# Patient Record
Sex: Female | Born: 1935 | Race: White | Hispanic: No | Marital: Married | Smoking: Former smoker
Health system: Southern US, Community
[De-identification: ages and names within clinical notes are randomized; demographics above are authoritative.]

## PROBLEM LIST (undated history)

## (undated) DIAGNOSIS — I739 Peripheral vascular disease, unspecified: Secondary | ICD-10-CM

## (undated) DIAGNOSIS — K219 Gastro-esophageal reflux disease without esophagitis: Secondary | ICD-10-CM

## (undated) DIAGNOSIS — G2581 Restless legs syndrome: Secondary | ICD-10-CM

## (undated) DIAGNOSIS — Z86718 Personal history of other venous thrombosis and embolism: Secondary | ICD-10-CM

## (undated) DIAGNOSIS — M1990 Unspecified osteoarthritis, unspecified site: Secondary | ICD-10-CM

## (undated) DIAGNOSIS — Z9621 Cochlear implant status: Secondary | ICD-10-CM

## (undated) DIAGNOSIS — J9611 Chronic respiratory failure with hypoxia: Secondary | ICD-10-CM

## (undated) DIAGNOSIS — I10 Essential (primary) hypertension: Secondary | ICD-10-CM

## (undated) DIAGNOSIS — G629 Polyneuropathy, unspecified: Secondary | ICD-10-CM

## (undated) DIAGNOSIS — D649 Anemia, unspecified: Secondary | ICD-10-CM

## (undated) DIAGNOSIS — E876 Hypokalemia: Secondary | ICD-10-CM

## (undated) DIAGNOSIS — J189 Pneumonia, unspecified organism: Secondary | ICD-10-CM

## (undated) DIAGNOSIS — N289 Disorder of kidney and ureter, unspecified: Secondary | ICD-10-CM

## (undated) DIAGNOSIS — Z9981 Dependence on supplemental oxygen: Secondary | ICD-10-CM

## (undated) DIAGNOSIS — I517 Cardiomegaly: Secondary | ICD-10-CM

## (undated) DIAGNOSIS — J40 Bronchitis, not specified as acute or chronic: Secondary | ICD-10-CM

## (undated) DIAGNOSIS — J45909 Unspecified asthma, uncomplicated: Secondary | ICD-10-CM

## (undated) DIAGNOSIS — K8020 Calculus of gallbladder without cholecystitis without obstruction: Secondary | ICD-10-CM

## (undated) HISTORY — DX: Unspecified asthma, uncomplicated: J45.909

## (undated) HISTORY — PX: BREAST BIOPSY: SHX20

## (undated) HISTORY — DX: Personal history of other venous thrombosis and embolism: Z86.718

## (undated) HISTORY — PX: EYE SURGERY: SHX253

## (undated) HISTORY — DX: Calculus of gallbladder without cholecystitis without obstruction: K80.20

## (undated) HISTORY — PX: OTHER SURGICAL HISTORY: SHX169

## (undated) HISTORY — DX: Gastro-esophageal reflux disease without esophagitis: K21.9

## (undated) HISTORY — DX: Anemia, unspecified: D64.9

## (undated) HISTORY — DX: Unspecified osteoarthritis, unspecified site: M19.90

---
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
## 1958-01-11 HISTORY — PX: ABDOMINAL HYSTERECTOMY: SHX81

## 2003-03-04 ENCOUNTER — Other Ambulatory Visit: Payer: Self-pay

## 2004-03-03 ENCOUNTER — Ambulatory Visit: Payer: Self-pay | Admitting: *Deleted

## 2004-08-05 ENCOUNTER — Ambulatory Visit: Payer: Self-pay | Admitting: Internal Medicine

## 2004-10-28 ENCOUNTER — Ambulatory Visit: Payer: Self-pay | Admitting: Internal Medicine

## 2004-11-10 ENCOUNTER — Ambulatory Visit: Payer: Self-pay | Admitting: Gastroenterology

## 2005-04-26 ENCOUNTER — Ambulatory Visit: Payer: Self-pay | Admitting: *Deleted

## 2005-08-24 ENCOUNTER — Ambulatory Visit: Payer: Self-pay | Admitting: Internal Medicine

## 2005-09-16 ENCOUNTER — Ambulatory Visit: Payer: Self-pay | Admitting: Internal Medicine

## 2006-01-03 ENCOUNTER — Emergency Department: Payer: Self-pay | Admitting: Emergency Medicine

## 2006-01-06 ENCOUNTER — Ambulatory Visit: Payer: Self-pay | Admitting: Internal Medicine

## 2006-01-31 ENCOUNTER — Ambulatory Visit: Payer: Self-pay | Admitting: Internal Medicine

## 2006-04-18 ENCOUNTER — Ambulatory Visit (HOSPITAL_COMMUNITY): Admission: RE | Admit: 2006-04-18 | Discharge: 2006-04-19 | Payer: Self-pay | Admitting: *Deleted

## 2006-09-01 ENCOUNTER — Ambulatory Visit: Payer: Self-pay | Admitting: Internal Medicine

## 2006-12-06 ENCOUNTER — Ambulatory Visit: Payer: Self-pay | Admitting: Ophthalmology

## 2006-12-06 ENCOUNTER — Other Ambulatory Visit: Payer: Self-pay

## 2006-12-19 ENCOUNTER — Ambulatory Visit: Payer: Self-pay | Admitting: Ophthalmology

## 2007-01-11 ENCOUNTER — Ambulatory Visit: Payer: Self-pay | Admitting: Ophthalmology

## 2007-01-12 HISTORY — PX: COCHLEAR IMPLANT: SUR684

## 2007-01-24 ENCOUNTER — Ambulatory Visit: Payer: Self-pay | Admitting: Ophthalmology

## 2007-08-02 ENCOUNTER — Ambulatory Visit: Payer: Self-pay | Admitting: Unknown Physician Specialty

## 2007-09-05 ENCOUNTER — Ambulatory Visit: Payer: Self-pay | Admitting: Internal Medicine

## 2007-11-21 ENCOUNTER — Emergency Department: Payer: Self-pay | Admitting: Emergency Medicine

## 2007-11-21 ENCOUNTER — Ambulatory Visit: Payer: Self-pay | Admitting: Unknown Physician Specialty

## 2008-03-05 ENCOUNTER — Ambulatory Visit: Payer: Self-pay | Admitting: Vascular Surgery

## 2008-09-09 ENCOUNTER — Ambulatory Visit: Payer: Self-pay | Admitting: Internal Medicine

## 2008-10-22 ENCOUNTER — Ambulatory Visit: Payer: Self-pay | Admitting: Unknown Physician Specialty

## 2009-04-21 ENCOUNTER — Ambulatory Visit: Payer: Self-pay | Admitting: Internal Medicine

## 2009-05-21 ENCOUNTER — Ambulatory Visit: Payer: Self-pay | Admitting: Pain Medicine

## 2009-05-28 ENCOUNTER — Ambulatory Visit: Payer: Self-pay | Admitting: Pain Medicine

## 2009-06-11 ENCOUNTER — Ambulatory Visit: Payer: Self-pay | Admitting: Pain Medicine

## 2009-06-24 ENCOUNTER — Ambulatory Visit: Payer: Self-pay | Admitting: Pain Medicine

## 2009-07-09 ENCOUNTER — Ambulatory Visit: Payer: Self-pay | Admitting: Pain Medicine

## 2009-07-24 ENCOUNTER — Ambulatory Visit: Payer: Self-pay | Admitting: Pain Medicine

## 2009-08-06 ENCOUNTER — Ambulatory Visit: Payer: Self-pay | Admitting: Pain Medicine

## 2009-08-19 ENCOUNTER — Ambulatory Visit: Payer: Self-pay | Admitting: Pain Medicine

## 2009-09-01 ENCOUNTER — Ambulatory Visit: Payer: Self-pay | Admitting: Pain Medicine

## 2009-09-10 ENCOUNTER — Ambulatory Visit: Payer: Self-pay | Admitting: Internal Medicine

## 2009-12-09 ENCOUNTER — Ambulatory Visit: Payer: Self-pay | Admitting: Internal Medicine

## 2010-07-12 ENCOUNTER — Emergency Department: Payer: Self-pay | Admitting: Emergency Medicine

## 2010-08-13 ENCOUNTER — Ambulatory Visit: Payer: Self-pay | Admitting: Vascular Surgery

## 2010-10-15 ENCOUNTER — Ambulatory Visit: Payer: Self-pay | Admitting: Internal Medicine

## 2011-02-03 ENCOUNTER — Emergency Department: Payer: Self-pay | Admitting: *Deleted

## 2011-02-03 LAB — PROTIME-INR
INR: 1
Prothrombin Time: 13.1 secs (ref 11.5–14.7)

## 2011-02-03 LAB — CBC WITH DIFFERENTIAL/PLATELET
Basophil #: 0.1 10*3/uL (ref 0.0–0.1)
Eosinophil #: 0 10*3/uL (ref 0.0–0.7)
HCT: 39.3 % (ref 35.0–47.0)
Lymphocyte #: 1.2 10*3/uL (ref 1.0–3.6)
Lymphocyte %: 8.5 %
MCHC: 33.8 g/dL (ref 32.0–36.0)
Monocyte %: 3 %
Neutrophil #: 12.2 10*3/uL — ABNORMAL HIGH (ref 1.4–6.5)
Platelet: 262 10*3/uL (ref 150–440)
RBC: 4.14 10*6/uL (ref 3.80–5.20)
RDW: 15.8 % — ABNORMAL HIGH (ref 11.5–14.5)
WBC: 13.8 10*3/uL — ABNORMAL HIGH (ref 3.6–11.0)

## 2011-02-03 LAB — COMPREHENSIVE METABOLIC PANEL
Albumin: 3.4 g/dL (ref 3.4–5.0)
Alkaline Phosphatase: 50 U/L (ref 50–136)
Anion Gap: 12 (ref 7–16)
Bilirubin,Total: 0.2 mg/dL (ref 0.2–1.0)
Chloride: 97 mmol/L — ABNORMAL LOW (ref 98–107)
Co2: 29 mmol/L (ref 21–32)
Creatinine: 0.89 mg/dL (ref 0.60–1.30)
EGFR (African American): >60
EGFR (Non-African Amer.): >60
Osmolality: 282 (ref 275–301)
Potassium: 3.7 mmol/L (ref 3.5–5.1)
SGOT(AST): 25 U/L (ref 15–37)
Sodium: 138 mmol/L (ref 136–145)

## 2011-02-03 LAB — APTT: Activated PTT: <23 secs — ABNORMAL LOW (ref 23.6–35.9)

## 2011-02-06 ENCOUNTER — Emergency Department: Payer: Self-pay | Admitting: Internal Medicine

## 2011-07-26 ENCOUNTER — Ambulatory Visit: Payer: Self-pay | Admitting: Vascular Surgery

## 2011-07-26 LAB — CREATININE, SERUM
Creatinine: 0.92 mg/dL (ref 0.60–1.30)
EGFR (African American): >60
EGFR (Non-African Amer.): 59 — ABNORMAL LOW

## 2011-07-26 LAB — BUN: BUN: 17 mg/dL (ref 7–18)

## 2011-10-18 ENCOUNTER — Ambulatory Visit: Payer: Self-pay | Admitting: Internal Medicine

## 2011-11-03 ENCOUNTER — Ambulatory Visit: Payer: Self-pay | Admitting: Cardiology

## 2012-01-12 DIAGNOSIS — Z86718 Personal history of other venous thrombosis and embolism: Secondary | ICD-10-CM

## 2012-01-12 HISTORY — DX: Personal history of other venous thrombosis and embolism: Z86.718

## 2012-02-14 ENCOUNTER — Ambulatory Visit: Payer: Self-pay | Admitting: Vascular Surgery

## 2012-02-14 LAB — CREATININE, SERUM
EGFR (African American): >60
EGFR (Non-African Amer.): 55 — ABNORMAL LOW

## 2012-02-14 LAB — BUN: BUN: 26 mg/dL — ABNORMAL HIGH (ref 7–18)

## 2012-08-21 ENCOUNTER — Ambulatory Visit: Payer: Self-pay | Admitting: Vascular Surgery

## 2012-08-21 LAB — CREATININE, SERUM
Creatinine: 0.93 mg/dL (ref 0.60–1.30)
EGFR (African American): >60
EGFR (Non-African Amer.): 58 — ABNORMAL LOW

## 2012-08-21 LAB — BUN: BUN: 24 mg/dL — ABNORMAL HIGH (ref 7–18)

## 2012-08-22 ENCOUNTER — Inpatient Hospital Stay: Payer: Self-pay | Admitting: Vascular Surgery

## 2012-08-22 LAB — CBC WITH DIFFERENTIAL/PLATELET
Basophil #: 0 10*3/uL (ref 0.0–0.1)
Eosinophil #: 0.1 10*3/uL (ref 0.0–0.7)
Eosinophil %: 0.7 %
HCT: 32.9 % — ABNORMAL LOW (ref 35.0–47.0)
HGB: 11 g/dL — ABNORMAL LOW (ref 12.0–16.0)
Lymphocyte #: 1.9 10*3/uL (ref 1.0–3.6)
Lymphocyte %: 19.5 %
MCV: 86 fL (ref 80–100)
Monocyte %: 11.8 %
Neutrophil %: 67.6 %
Platelet: 241 10*3/uL (ref 150–440)
RDW: 18.3 % — ABNORMAL HIGH (ref 11.5–14.5)
WBC: 10 10*3/uL (ref 3.6–11.0)

## 2012-08-22 LAB — BASIC METABOLIC PANEL
Chloride: 104 mmol/L (ref 98–107)
Co2: 31 mmol/L (ref 21–32)
Creatinine: 0.77 mg/dL (ref 0.60–1.30)
EGFR (African American): >60
EGFR (Non-African Amer.): >60
Glucose: 115 mg/dL — ABNORMAL HIGH (ref 65–99)
Sodium: 138 mmol/L (ref 136–145)

## 2012-08-22 LAB — URINALYSIS, COMPLETE
Bilirubin,UR: NEGATIVE
Glucose,UR: 50 mg/dL (ref 0–75)
Ketone: NEGATIVE
Leukocyte Esterase: NEGATIVE
Nitrite: NEGATIVE
Protein: NEGATIVE
Specific Gravity: 1.004 (ref 1.003–1.030)
WBC UR: NONE SEEN /HPF (ref 0–5)

## 2012-08-23 LAB — BASIC METABOLIC PANEL
Anion Gap: 8 (ref 7–16)
Calcium, Total: 8.8 mg/dL (ref 8.5–10.1)
Chloride: 108 mmol/L — ABNORMAL HIGH (ref 98–107)
Co2: 27 mmol/L (ref 21–32)
EGFR (African American): >60
Glucose: 132 mg/dL — ABNORMAL HIGH (ref 65–99)
Osmolality: 287 (ref 275–301)
Sodium: 143 mmol/L (ref 136–145)

## 2012-08-23 LAB — CBC WITH DIFFERENTIAL/PLATELET
Basophil %: 0.1 %
Eosinophil #: 0 10*3/uL (ref 0.0–0.7)
Eosinophil %: 0 %
HCT: 29.7 % — ABNORMAL LOW (ref 35.0–47.0)
HGB: 9.9 g/dL — ABNORMAL LOW (ref 12.0–16.0)
MCH: 28.5 pg (ref 26.0–34.0)
MCHC: 33.4 g/dL (ref 32.0–36.0)
MCV: 85 fL (ref 80–100)
Monocyte %: 8.3 %
Neutrophil #: 7.6 10*3/uL — ABNORMAL HIGH (ref 1.4–6.5)
Neutrophil %: 79.7 %
Platelet: 210 10*3/uL (ref 150–440)
RBC: 3.47 10*6/uL — ABNORMAL LOW (ref 3.80–5.20)
RDW: 18.2 % — ABNORMAL HIGH (ref 11.5–14.5)

## 2012-08-23 LAB — HEPATIC FUNCTION PANEL A (ARMC)
Albumin: 2.7 g/dL — ABNORMAL LOW (ref 3.4–5.0)
Alkaline Phosphatase: 91 U/L (ref 50–136)
Bilirubin, Direct: <0.1 mg/dL (ref 0.00–0.20)
Bilirubin,Total: 0.2 mg/dL (ref 0.2–1.0)
SGPT (ALT): 23 U/L (ref 12–78)
Total Protein: 6.5 g/dL (ref 6.4–8.2)

## 2012-08-23 LAB — APTT
Activated PTT: 59.8 secs — ABNORMAL HIGH (ref 23.6–35.9)
Activated PTT: 58 secs — ABNORMAL HIGH (ref 23.6–35.9)

## 2012-08-23 LAB — PROTIME-INR: Prothrombin Time: 13.5 secs (ref 11.5–14.7)

## 2012-08-24 LAB — CBC WITH DIFFERENTIAL/PLATELET
Basophil #: 0 10*3/uL (ref 0.0–0.1)
Basophil %: 0.3 %
Eosinophil %: 0 %
HGB: 9.5 g/dL — ABNORMAL LOW (ref 12.0–16.0)
Lymphocyte #: 1.1 10*3/uL (ref 1.0–3.6)
Lymphocyte %: 9.8 %
Monocyte #: 0.7 x10 3/mm (ref 0.2–0.9)
Neutrophil %: 84 %
RBC: 3.39 10*6/uL — ABNORMAL LOW (ref 3.80–5.20)
RDW: 18 % — ABNORMAL HIGH (ref 11.5–14.5)
WBC: 11.1 10*3/uL — ABNORMAL HIGH (ref 3.6–11.0)

## 2012-08-25 LAB — APTT: Activated PTT: 44.9 secs — ABNORMAL HIGH (ref 23.6–35.9)

## 2012-09-02 ENCOUNTER — Emergency Department: Payer: Self-pay | Admitting: Emergency Medicine

## 2012-09-02 LAB — BASIC METABOLIC PANEL
Anion Gap: 5 — ABNORMAL LOW (ref 7–16)
BUN: 14 mg/dL (ref 7–18)
Chloride: 101 mmol/L (ref 98–107)
Co2: 31 mmol/L (ref 21–32)
Creatinine: 0.99 mg/dL (ref 0.60–1.30)
EGFR (Non-African Amer.): 53 — ABNORMAL LOW
Potassium: 2.6 mmol/L — ABNORMAL LOW (ref 3.5–5.1)
Sodium: 137 mmol/L (ref 136–145)

## 2012-09-02 LAB — CBC
HCT: 28.8 % — ABNORMAL LOW (ref 35.0–47.0)
MCHC: 33.6 g/dL (ref 32.0–36.0)
MCV: 86 fL (ref 80–100)
Platelet: 370 10*3/uL (ref 150–440)
WBC: 9.2 10*3/uL (ref 3.6–11.0)

## 2012-09-02 LAB — PROTIME-INR: Prothrombin Time: 12.3 secs (ref 11.5–14.7)

## 2012-09-07 ENCOUNTER — Ambulatory Visit: Payer: Self-pay | Admitting: Vascular Surgery

## 2012-09-07 LAB — BASIC METABOLIC PANEL
Anion Gap: 8 (ref 7–16)
Chloride: 98 mmol/L (ref 98–107)
Co2: 29 mmol/L (ref 21–32)
EGFR (Non-African Amer.): 50 — ABNORMAL LOW
Glucose: 65 mg/dL (ref 65–99)

## 2012-09-08 LAB — CBC
HCT: 26.1 % — ABNORMAL LOW (ref 35.0–47.0)
MCH: 28.5 pg (ref 26.0–34.0)
MCV: 85 fL (ref 80–100)
Platelet: 196 10*3/uL (ref 150–440)
RBC: 3.08 10*6/uL — ABNORMAL LOW (ref 3.80–5.20)
RDW: 18.8 % — ABNORMAL HIGH (ref 11.5–14.5)

## 2012-09-08 LAB — BASIC METABOLIC PANEL
Anion Gap: 6 — ABNORMAL LOW (ref 7–16)
BUN: 12 mg/dL (ref 7–18)
Calcium, Total: 8.3 mg/dL — ABNORMAL LOW (ref 8.5–10.1)
EGFR (African American): >60
EGFR (Non-African Amer.): >60
Glucose: 105 mg/dL — ABNORMAL HIGH (ref 65–99)
Osmolality: 276 (ref 275–301)
Potassium: 2.6 mmol/L — ABNORMAL LOW (ref 3.5–5.1)
Sodium: 138 mmol/L (ref 136–145)

## 2012-09-08 LAB — PROTIME-INR: Prothrombin Time: 14.8 secs — ABNORMAL HIGH (ref 11.5–14.7)

## 2012-10-19 ENCOUNTER — Ambulatory Visit: Payer: Self-pay | Admitting: Internal Medicine

## 2012-11-01 ENCOUNTER — Inpatient Hospital Stay: Payer: Self-pay | Admitting: Vascular Surgery

## 2012-11-01 LAB — BASIC METABOLIC PANEL
Anion Gap: 6 — ABNORMAL LOW (ref 7–16)
BUN: 19 mg/dL — ABNORMAL HIGH (ref 7–18)
Co2: 27 mmol/L (ref 21–32)
Creatinine: 0.85 mg/dL (ref 0.60–1.30)
EGFR (African American): >60
Sodium: 141 mmol/L (ref 136–145)

## 2012-11-01 LAB — CREATININE, SERUM
Creatinine: 0.98 mg/dL (ref 0.60–1.30)
EGFR (African American): >60

## 2012-11-01 LAB — APTT: Activated PTT: 114.8 secs — ABNORMAL HIGH (ref 23.6–35.9)

## 2012-11-01 LAB — BUN: BUN: 23 mg/dL — ABNORMAL HIGH (ref 7–18)

## 2012-11-02 LAB — BASIC METABOLIC PANEL
Calcium, Total: 8.6 mg/dL (ref 8.5–10.1)
Co2: 28 mmol/L (ref 21–32)
EGFR (African American): >60
Glucose: 120 mg/dL — ABNORMAL HIGH (ref 65–99)
Potassium: 3.4 mmol/L — ABNORMAL LOW (ref 3.5–5.1)

## 2012-11-02 LAB — FIBRINOGEN
Fibrinogen: 262 mg/dL (ref 210–470)
Fibrinogen: 320 mg/dL (ref 210–470)

## 2012-11-02 LAB — APTT
Activated PTT: 40.5 secs — ABNORMAL HIGH (ref 23.6–35.9)
Activated PTT: 41.4 secs — ABNORMAL HIGH (ref 23.6–35.9)

## 2012-11-02 LAB — HEMOGLOBIN
HGB: 8.9 g/dL — ABNORMAL LOW (ref 12.0–16.0)
HGB: 8.7 g/dL — ABNORMAL LOW (ref 12.0–16.0)

## 2012-11-03 LAB — CBC WITH DIFFERENTIAL/PLATELET
Basophil #: 0.1 10*3/uL (ref 0.0–0.1)
Basophil %: 0.9 %
Eosinophil #: 0.1 10*3/uL (ref 0.0–0.7)
Eosinophil %: 1 %
HCT: 24 % — ABNORMAL LOW (ref 35.0–47.0)
Lymphocyte %: 12.5 %
MCH: 27.4 pg (ref 26.0–34.0)
MCHC: 33 g/dL (ref 32.0–36.0)
Monocyte #: 1.2 x10 3/mm — ABNORMAL HIGH (ref 0.2–0.9)
Neutrophil #: 8.7 10*3/uL — ABNORMAL HIGH (ref 1.4–6.5)
RBC: 2.88 10*6/uL — ABNORMAL LOW (ref 3.80–5.20)
WBC: 11.5 10*3/uL — ABNORMAL HIGH (ref 3.6–11.0)

## 2012-11-03 LAB — BASIC METABOLIC PANEL
Anion Gap: 7 (ref 7–16)
Calcium, Total: 8.1 mg/dL — ABNORMAL LOW (ref 8.5–10.1)
EGFR (Non-African Amer.): >60
Osmolality: 273 (ref 275–301)
Potassium: 3.3 mmol/L — ABNORMAL LOW (ref 3.5–5.1)
Sodium: 137 mmol/L (ref 136–145)

## 2012-11-04 LAB — BASIC METABOLIC PANEL
BUN: 14 mg/dL (ref 7–18)
Calcium, Total: 8.9 mg/dL (ref 8.5–10.1)
Chloride: 106 mmol/L (ref 98–107)
Creatinine: 0.99 mg/dL (ref 0.60–1.30)
EGFR (African American): >60
EGFR (Non-African Amer.): 53 — ABNORMAL LOW
Osmolality: 277 (ref 275–301)
Potassium: 2.9 mmol/L — ABNORMAL LOW (ref 3.5–5.1)

## 2012-11-04 LAB — CBC WITH DIFFERENTIAL/PLATELET
Basophil #: 0 10*3/uL (ref 0.0–0.1)
Eosinophil %: 0 %
HCT: 22.1 % — ABNORMAL LOW (ref 35.0–47.0)
HGB: 7.3 g/dL — ABNORMAL LOW (ref 12.0–16.0)
Lymphocyte #: 0.9 10*3/uL — ABNORMAL LOW (ref 1.0–3.6)
Lymphocyte %: 6.2 %
MCHC: 32.8 g/dL (ref 32.0–36.0)
MCV: 84 fL (ref 80–100)
Neutrophil #: 12.4 10*3/uL — ABNORMAL HIGH (ref 1.4–6.5)
RDW: 17.3 % — ABNORMAL HIGH (ref 11.5–14.5)

## 2012-11-06 DIAGNOSIS — M109 Gout, unspecified: Secondary | ICD-10-CM

## 2012-11-06 DIAGNOSIS — I998 Other disorder of circulatory system: Secondary | ICD-10-CM

## 2012-11-06 DIAGNOSIS — K219 Gastro-esophageal reflux disease without esophagitis: Secondary | ICD-10-CM

## 2012-11-06 DIAGNOSIS — I10 Essential (primary) hypertension: Secondary | ICD-10-CM

## 2013-01-03 ENCOUNTER — Emergency Department: Payer: Self-pay | Admitting: Emergency Medicine

## 2013-01-03 LAB — TROPONIN I: Troponin-I: <0.02 ng/mL

## 2013-01-03 LAB — CBC WITH DIFFERENTIAL/PLATELET
Basophil #: 0.1 10*3/uL (ref 0.0–0.1)
HCT: 38.8 % (ref 35.0–47.0)
MCH: 27 pg (ref 26.0–34.0)
MCHC: 31.8 g/dL — ABNORMAL LOW (ref 32.0–36.0)
MCV: 85 fL (ref 80–100)
Monocyte #: 0.7 x10 3/mm (ref 0.2–0.9)
Monocyte %: 8.7 %
Neutrophil #: 5.4 10*3/uL (ref 1.4–6.5)
Neutrophil %: 72.7 %
RBC: 4.56 10*6/uL (ref 3.80–5.20)
RDW: 20.3 % — ABNORMAL HIGH (ref 11.5–14.5)

## 2013-01-03 LAB — COMPREHENSIVE METABOLIC PANEL
Albumin: 3.5 g/dL (ref 3.4–5.0)
Alkaline Phosphatase: 94 U/L
Anion Gap: 6 — ABNORMAL LOW (ref 7–16)
Bilirubin,Total: 0.2 mg/dL (ref 0.2–1.0)
Calcium, Total: 10.3 mg/dL — ABNORMAL HIGH (ref 8.5–10.1)
Co2: 28 mmol/L (ref 21–32)
Glucose: 95 mg/dL (ref 65–99)
Osmolality: 279 (ref 275–301)
SGOT(AST): 26 U/L (ref 15–37)
SGPT (ALT): 23 U/L (ref 12–78)
Sodium: 139 mmol/L (ref 136–145)
Total Protein: 7.5 g/dL (ref 6.4–8.2)

## 2013-01-03 LAB — URINALYSIS, COMPLETE
Bilirubin,UR: NEGATIVE
Blood: NEGATIVE
Glucose,UR: NEGATIVE mg/dL (ref 0–75)
Hyaline Cast: 6
Ketone: NEGATIVE
Leukocyte Esterase: NEGATIVE
Nitrite: NEGATIVE
Ph: 5 (ref 4.5–8.0)
RBC,UR: <1 /HPF (ref 0–5)
Specific Gravity: 1.006 (ref 1.003–1.030)
Squamous Epithelial: <1

## 2013-01-03 LAB — APTT: Activated PTT: 34.5 secs (ref 23.6–35.9)

## 2013-01-11 DIAGNOSIS — K8020 Calculus of gallbladder without cholecystitis without obstruction: Secondary | ICD-10-CM

## 2013-01-11 HISTORY — PX: COLONOSCOPY: SHX174

## 2013-01-11 HISTORY — DX: Calculus of gallbladder without cholecystitis without obstruction: K80.20

## 2013-01-17 ENCOUNTER — Ambulatory Visit: Payer: Self-pay | Admitting: Internal Medicine

## 2013-03-23 ENCOUNTER — Ambulatory Visit: Payer: Self-pay | Admitting: Gastroenterology

## 2013-04-17 ENCOUNTER — Ambulatory Visit: Payer: Self-pay | Admitting: Internal Medicine

## 2013-04-19 ENCOUNTER — Encounter: Payer: Self-pay | Admitting: General Surgery

## 2013-04-19 ENCOUNTER — Ambulatory Visit (INDEPENDENT_AMBULATORY_CARE_PROVIDER_SITE_OTHER): Payer: Medicare Other | Admitting: General Surgery

## 2013-04-19 VITALS — BP 118/70 | HR 86 | Resp 18 | Ht 60.5 in | Wt 113.0 lb

## 2013-04-19 DIAGNOSIS — K8010 Calculus of gallbladder with chronic cholecystitis without obstruction: Secondary | ICD-10-CM

## 2013-04-19 NOTE — Progress Notes (Signed)
Patient ID: Natasha [name], female   DOB: [birth year], 80 y.o.   MRN: [id number]  Chief Complaint  Patient presents with  . Abdominal Pain    New Patient evaluation of gallbladder    HPI Natasha [name] is a 80 y.o. female who presents for an evaluation of her gallbladder. The patient had an abdominal ultrasound performed on 04/17/13. The patient complains of diarrhea, vomiting, right upper abdominal soreness for approximately 12 weeks. Greasy/fatty foods make it worse. The patient complains of back soreness as well.   HPI  Past Medical History  Diagnosis Date  . GERD (gastroesophageal reflux disease)   . Asthma   . Anemia   . Arthritis   . H/O blood clots 2014  . Gallstones 2015    Past Surgical History  Procedure Laterality Date  . Abdominal hysterectomy  1960  . Appendectomy  1946  . Cochlear implant  2009  . Eye surgery  [phone]    cataract  . Stent placement  2006-2014    multiple stent placements in legs  . Colonoscopy  2015    Dr. Rayann Heman     Family History  Problem Relation Age of Onset  . Heart disease Mother   . Heart disease Father   . Cancer Sister     lung    Social History History  Substance Use Topics  . Smoking status: Former Research scientist (life sciences)  . Smokeless tobacco: Not on file  . Alcohol Use: No    Allergies  Allergen Reactions  . Codeine Diarrhea  . Hydrocodone     Current Outpatient Prescriptions  Medication Sig Dispense Refill  . albuterol (PROVENTIL HFA;VENTOLIN HFA) 108 (90 BASE) MCG/ACT inhaler Inhale 1-2 puffs into the lungs 2 (two) times daily.      . Calcium-Magnesium-Vitamin D (CALCIUM 1200+D3 PO) Take by mouth.      . cilostazol (PLETAL) 100 MG tablet Take 2 tablets by mouth daily.      Marland Kitchen diltiazem (TIAZAC) 180 MG 24 hr capsule Take 1 capsule by mouth daily.      . ferrous sulfate 325 (65 FE) MG tablet Take 325 mg by mouth daily.      . fluticasone (FLONASE) 50 MCG/ACT nasal spray Place 2 sprays into both nostrils daily.      .  furosemide (LASIX) 20 MG tablet Take 1 tablet by mouth daily.      Marland Kitchen glucosamine-chondroitin 500-400 MG tablet Take 1 tablet by mouth daily.      . montelukast (SINGULAIR) 10 MG tablet Take 1 tablet by mouth daily.      Donell Sievert IN Inhale into the lungs.      . potassium chloride SA (K-DUR,KLOR-CON) 20 MEQ tablet Take 100 mEq by mouth daily.      . pramipexole (MIRAPEX) 0.25 MG tablet       . PULMICORT FLEXHALER 180 MCG/ACT inhaler 2 (two) times daily.      . simvastatin (ZOCOR) 20 MG tablet Take 1 tablet by mouth daily.      Marland Kitchen topiramate (TOPAMAX) 50 MG tablet Take 2 tablets by mouth daily.      Alveda Reasons 20 MG TABS tablet Take 1 tablet by mouth daily.      . zoledronic acid (RECLAST) 5 MG/100ML SOLN injection Inject 5 mg into the vein once.       No current facility-administered medications for this visit.    Review of Systems Review of Systems  Constitutional: Negative.   Respiratory: Negative.   Cardiovascular: Negative.  Gastrointestinal: Positive for vomiting, abdominal pain and diarrhea.    Blood pressure 118/70, pulse 86, resp. rate 18, height 5' 0.5" (1.537 m), weight 113 lb (51.256 kg).  Physical Exam Physical Exam  Constitutional: She appears well-developed and well-nourished.  Eyes: Conjunctivae are normal. No scleral icterus.  Neck: Neck supple. No thyromegaly present.  Cardiovascular: Normal rate, regular rhythm and normal heart sounds.   No murmur heard. Pulses:      Carotid pulses are 2+ on the right side, and 2+ on the left side.      Femoral pulses are 2+ on the right side, and 2+ on the left side.      Dorsalis pedis pulses are 1+ on the right side, and 2+ on the left side.       Posterior tibial pulses are 1+ on the right side, and 2+ on the left side.  Pulmonary/Chest: Effort normal. She has wheezes (right side minimal.).  Abdominal: Soft. Normal appearance and bowel sounds are normal. There is no hepatosplenomegaly. There is tenderness (mild  tenderness right upper quadrant with deep palpation. ) in the right upper quadrant. There is negative Murphy's sign. No hernia.  Lymphadenopathy:    She has no cervical adenopathy.  Neurological: She is alert.    Data Reviewed  Dr. Ammie Ferrier notes and abdominal ultrasound. She has cholelithiasis but no  pericholecystic fluid or gallbladder wall thickening.   Assessment    Cholelithiasis, chronic cholecystitis. Pt's symptoms are atypical but she has had colonoscopy, stool checks which were normal. Her diarrhea may or may not be of biliary source but her pain and fatty food intolerance likely due to cholelithiasis.    Plan    Patient to be scheduled for cholecystectomy. Explained procedure and risks/benefits to patient. Patient is agreeable.     Patient's surgery has been scheduled for 04-24-13 at [facility].   Alexzandrea Normington G Giovana Faciane 04/20/2013, 8:06 AM

## 2013-04-19 NOTE — Patient Instructions (Addendum)
Patient to be scheduled for gallbladder surgery. The patient is aware to call back for any questions or concerns. Patient advised to take only Xalrelto today and tomorrow only.   Laparoscopic Cholecystectomy Laparoscopic cholecystectomy is surgery to remove the gallbladder. The gallbladder is located in the upper right part of the abdomen, behind the liver. It is a storage sac for bile produced in the liver. Bile aids in the digestion and absorption of fats. Cholecystectomy is often done for inflammation of the gallbladder (cholecystitis). This condition is usually caused by a buildup of gallstones (cholelithiasis) in your gallbladder. Gallstones can block the flow of bile, resulting in inflammation and pain. In severe cases, emergency surgery may be required. When emergency surgery is not required, you will have time to prepare for the procedure. Laparoscopic surgery is an alternative to open surgery. Laparoscopic surgery has a shorter recovery time. Your common bile duct may also need to be examined during the procedure. If stones are found in the common bile duct, they may be removed. LET [facility] CARE PROVIDER KNOW ABOUT:  Any allergies you have.  All medicines you are taking, including vitamins, herbs, eye drops, creams, and over-the-counter medicines.  Previous problems you or members of your family have had with the use of anesthetics.  Any blood disorders you have.  Previous surgeries you have had.  Medical conditions you have. RISKS AND COMPLICATIONS Generally, this is a safe procedure. However, as with any procedure, complications can occur. Possible complications include:  Infection.  Damage to the common bile duct, nerves, arteries, veins, or other internal organs such as the stomach, liver, or intestines.  Bleeding.  A stone may remain in the common bile duct.  A bile leak from the cyst duct that is clipped when your gallbladder is removed.  The need to convert to open  surgery, which requires a larger incision in the abdomen. This may be necessary if your surgeon thinks it is not safe to continue with a laparoscopic procedure. BEFORE THE PROCEDURE  Ask your health care provider about changing or stopping any regular medicines. You will need to stop taking aspirin or blood thinners at least 5 days prior to surgery.  Do not eat or drink anything after midnight the night before surgery.  Let your health care provider know if you develop a cold or other infectious problem before surgery. PROCEDURE   You will be given medicine to make you sleep through the procedure (general anesthetic). A breathing tube will be placed in your mouth.  When you are asleep, your surgeon will make several small cuts (incisions) in your abdomen.  A thin, lighted tube with a tiny camera on the end (laparoscope) is inserted through one of the small incisions. The camera on the laparoscope sends a picture to a TV screen in the operating room. This gives the surgeon a good view inside your abdomen.  A gas will be pumped into your abdomen. This expands your abdomen so that the surgeon has more room to perform the surgery.  Other tools needed for the procedure are inserted through the other incisions. The gallbladder is removed through one of the incisions.  After the removal of your gallbladder, the incisions will be closed with stitches, staples, or skin glue. AFTER THE PROCEDURE  You will be taken to a recovery area where your progress will be checked often.  You may be allowed to go home the same day if your pain is controlled and you can tolerate liquids. Document  Released: 12/28/2004 Document Revised: 10/18/2012 Document Reviewed: 08/09/2012 [facility] Patient Information 2014 [place].  Patient's surgery has been scheduled for 04-24-13 at [facility].

## 2013-04-20 ENCOUNTER — Telehealth: Payer: Self-pay

## 2013-04-20 ENCOUNTER — Encounter: Payer: Self-pay | Admitting: General Surgery

## 2013-04-20 DIAGNOSIS — K8010 Calculus of gallbladder with chronic cholecystitis without obstruction: Secondary | ICD-10-CM | POA: Insufficient documentation

## 2013-04-20 LAB — CMP14+EGFR
ALT: 11 IU/L (ref 0–32)
AST: 17 IU/L (ref 0–40)
Albumin/Globulin Ratio: 2 (ref 1.1–2.5)
Albumin: 4.5 g/dL (ref 3.5–4.7)
Alkaline Phosphatase: 72 IU/L (ref 39–117)
BUN/Creatinine Ratio: 28 — ABNORMAL HIGH (ref 11–26)
BUN: 21 mg/dL (ref 8–27)
CALCIUM: 10.5 mg/dL — AB (ref 8.7–10.3)
CHLORIDE: 100 mmol/L (ref 97–108)
CO2: 25 mmol/L (ref 18–29)
Creatinine, Ser: 0.74 mg/dL (ref 0.57–1.00)
GFR calc Af Amer: 87 mL/min/{1.73_m2} (ref >59)
GFR calc non Af Amer: 76 mL/min/{1.73_m2} (ref >59)
Globulin, Total: 2.3 g/dL (ref 1.5–4.5)
Glucose: 110 mg/dL — ABNORMAL HIGH (ref 65–99)
POTASSIUM: 3.5 mmol/L (ref 3.5–5.2)
Sodium: 139 mmol/L (ref 134–144)
TOTAL PROTEIN: 6.8 g/dL (ref 6.0–8.5)
Total Bilirubin: <0.2 mg/dL (ref 0.0–1.2)

## 2013-04-20 LAB — CBC WITH DIFFERENTIAL
BASOS ABS: 0.1 10*3/uL (ref 0.0–0.2)
Basos: 1 %
Eos: 1 %
Eosinophils Absolute: 0.1 10*3/uL (ref 0.0–0.4)
HEMATOCRIT: 35.7 % (ref 34.0–46.6)
Hemoglobin: 11.9 g/dL (ref 11.1–15.9)
IMMATURE GRANULOCYTES: 0 %
Immature Grans (Abs): 0 10*3/uL (ref 0.0–0.1)
Lymphocytes Absolute: 1.8 10*3/uL (ref 0.7–3.1)
Lymphs: 31 %
MCH: 30.7 pg (ref 26.6–33.0)
MCHC: 33.3 g/dL (ref 31.5–35.7)
MCV: 92 fL (ref 79–97)
MONOCYTES: 10 %
Monocytes Absolute: 0.6 10*3/uL (ref 0.1–0.9)
NEUTROS ABS: 3.4 10*3/uL (ref 1.4–7.0)
Neutrophils Relative %: 57 %
PLATELETS: 306 10*3/uL (ref 150–379)
RBC: 3.88 x10E6/uL (ref 3.77–5.28)
RDW: 14.4 % (ref 12.3–15.4)
WBC: 5.9 10*3/uL (ref 3.4–10.8)

## 2013-04-20 LAB — LIPASE: LIPASE: 45 U/L (ref 0–59)

## 2013-04-20 NOTE — Telephone Encounter (Signed)
Spoke with patient about pre admit testing. She is scheduled for pre admit testing at [facility] in [facility] on 04/23/13 at 9:30 am. Patient is aware to bring all medications and filled out paperwork with her.

## 2013-04-23 ENCOUNTER — Ambulatory Visit: Payer: Self-pay | Admitting: General Surgery

## 2013-04-23 LAB — BASIC METABOLIC PANEL
Anion Gap: 4 — ABNORMAL LOW (ref 7–16)
BUN: 20 mg/dL — ABNORMAL HIGH (ref 7–18)
Calcium, Total: 10 mg/dL (ref 8.5–10.1)
Chloride: 105 mmol/L (ref 98–107)
Co2: 29 mmol/L (ref 21–32)
Creatinine: 0.7 mg/dL (ref 0.60–1.30)
EGFR (African American): >60
EGFR (Non-African Amer.): >60
GLUCOSE: 90 mg/dL (ref 65–99)
Osmolality: 278 (ref 275–301)
Potassium: 3.8 mmol/L (ref 3.5–5.1)
SODIUM: 138 mmol/L (ref 136–145)

## 2013-04-23 LAB — APTT: Activated PTT: 26.6 secs (ref 23.6–35.9)

## 2013-04-23 LAB — PROTIME-INR
INR: 0.9
Prothrombin Time: 12.5 secs (ref 11.5–14.7)

## 2013-04-24 ENCOUNTER — Ambulatory Visit: Payer: Self-pay | Admitting: General Surgery

## 2013-04-24 ENCOUNTER — Encounter: Payer: Self-pay | Admitting: General Surgery

## 2013-04-24 DIAGNOSIS — K8010 Calculus of gallbladder with chronic cholecystitis without obstruction: Secondary | ICD-10-CM

## 2013-04-24 HISTORY — PX: CHOLECYSTECTOMY: SHX55

## 2013-04-25 ENCOUNTER — Encounter: Payer: Self-pay | Admitting: General Surgery

## 2013-04-27 LAB — PATHOLOGY REPORT

## 2013-04-30 ENCOUNTER — Encounter: Payer: Self-pay | Admitting: General Surgery

## 2013-05-03 ENCOUNTER — Ambulatory Visit (INDEPENDENT_AMBULATORY_CARE_PROVIDER_SITE_OTHER): Payer: Medicare Other | Admitting: General Surgery

## 2013-05-03 ENCOUNTER — Encounter: Payer: Self-pay | Admitting: General Surgery

## 2013-05-03 VITALS — BP 138/58 | HR 88 | Resp 12 | Ht 60.5 in | Wt 113.0 lb

## 2013-05-03 DIAGNOSIS — K8010 Calculus of gallbladder with chronic cholecystitis without obstruction: Secondary | ICD-10-CM

## 2013-05-03 NOTE — Patient Instructions (Addendum)
The patient is aware to call back for any questions or concerns. If you notice any sharp abdomen pain then call office

## 2013-05-03 NOTE — Progress Notes (Signed)
Here today for postop visit, laparoscopic cholecystectomy done 04-24-13. States she is doing well. Port sites are clean. Lungs clear, abdomen soft.  At surgery cholangiogram showed questionable filling defects in distal CBD. Will check liver functions today. If normal no further w/u. Pt advised of potential symptoms from CBD stone.

## 2013-05-04 ENCOUNTER — Encounter: Payer: Self-pay | Admitting: General Surgery

## 2013-05-04 LAB — HEPATIC FUNCTION PANEL
ALK PHOS: 90 IU/L (ref 39–117)
ALT: 10 IU/L (ref 0–32)
AST: 22 IU/L (ref 0–40)
Albumin: 4.3 g/dL (ref 3.5–4.7)
Bilirubin, Direct: 0.07 mg/dL (ref 0.00–0.40)
Total Bilirubin: 0.2 mg/dL (ref 0.0–1.2)
Total Protein: 6.6 g/dL (ref 6.0–8.5)

## 2013-05-04 NOTE — Progress Notes (Signed)
Quick Note:  Inform pt labs are normal. F/u as needed ______

## 2013-05-07 ENCOUNTER — Inpatient Hospital Stay: Payer: Self-pay | Admitting: Vascular Surgery

## 2013-05-07 LAB — CBC WITH DIFFERENTIAL/PLATELET
Basophil #: 0.1 10*3/uL (ref 0.0–0.1)
Basophil %: 0.7 %
EOS PCT: 1.3 %
Eosinophil #: 0.1 10*3/uL (ref 0.0–0.7)
HCT: 34.2 % — AB (ref 35.0–47.0)
HGB: 11.3 g/dL — ABNORMAL LOW (ref 12.0–16.0)
LYMPHS ABS: 1.6 10*3/uL (ref 1.0–3.6)
Lymphocyte %: 21.8 %
MCH: 30.5 pg (ref 26.0–34.0)
MCHC: 33.1 g/dL (ref 32.0–36.0)
MCV: 92 fL (ref 80–100)
MONOS PCT: 6.9 %
Monocyte #: 0.5 x10 3/mm (ref 0.2–0.9)
NEUTROS ABS: 5.2 10*3/uL (ref 1.4–6.5)
Neutrophil %: 69.3 %
PLATELETS: 296 10*3/uL (ref 150–440)
RBC: 3.71 10*6/uL — ABNORMAL LOW (ref 3.80–5.20)
RDW: 14.1 % (ref 11.5–14.5)
WBC: 7.5 10*3/uL (ref 3.6–11.0)

## 2013-05-07 LAB — FIBRINOGEN: Fibrinogen: 481 mg/dL — ABNORMAL HIGH (ref 210–470)

## 2013-05-07 LAB — CREATININE, SERUM
Creatinine: 0.76 mg/dL (ref 0.60–1.30)
EGFR (Non-African Amer.): >60

## 2013-05-07 LAB — CLOSTRIDIUM DIFFICILE(ARMC)

## 2013-05-07 LAB — APTT: Activated PTT: 114.2 secs — ABNORMAL HIGH (ref 23.6–35.9)

## 2013-05-07 LAB — BUN: BUN: 16 mg/dL (ref 7–18)

## 2013-05-08 ENCOUNTER — Telehealth: Payer: Self-pay | Admitting: *Deleted

## 2013-05-08 LAB — CBC WITH DIFFERENTIAL/PLATELET
BASOS ABS: 0.1 10*3/uL (ref 0.0–0.1)
Basophil #: 0.1 10*3/uL (ref 0.0–0.1)
Basophil %: 0.6 %
Basophil %: 0.5 %
EOS ABS: 0 10*3/uL (ref 0.0–0.7)
EOS PCT: 0.3 %
Eosinophil #: 0 10*3/uL (ref 0.0–0.7)
Eosinophil %: 0 %
HCT: 34.1 % — AB (ref 35.0–47.0)
HCT: 31.3 % — AB (ref 35.0–47.0)
HGB: 11.1 g/dL — AB (ref 12.0–16.0)
HGB: 10.1 g/dL — AB (ref 12.0–16.0)
LYMPHS ABS: 0.8 10*3/uL — AB (ref 1.0–3.6)
Lymphocyte #: 1.2 10*3/uL (ref 1.0–3.6)
Lymphocyte %: 5.3 %
Lymphocyte %: 6 %
MCH: 30.2 pg (ref 26.0–34.0)
MCH: 30 pg (ref 26.0–34.0)
MCHC: 32.7 g/dL (ref 32.0–36.0)
MCHC: 32.2 g/dL (ref 32.0–36.0)
MCV: 93 fL (ref 80–100)
MCV: 93 fL (ref 80–100)
Monocyte #: 0.5 x10 3/mm (ref 0.2–0.9)
Monocyte #: 0.8 x10 3/mm (ref 0.2–0.9)
Monocyte %: 3.1 %
Monocyte %: 3.7 %
NEUTROS ABS: 18.4 10*3/uL — AB (ref 1.4–6.5)
NEUTROS PCT: 90.7 %
NEUTROS PCT: 89.8 %
Neutrophil #: 13.2 10*3/uL — ABNORMAL HIGH (ref 1.4–6.5)
Platelet: 262 10*3/uL (ref 150–440)
Platelet: 282 10*3/uL (ref 150–440)
RBC: 3.69 10*6/uL — AB (ref 3.80–5.20)
RBC: 3.36 10*6/uL — AB (ref 3.80–5.20)
RDW: 14.4 % (ref 11.5–14.5)
RDW: 14.5 % (ref 11.5–14.5)
WBC: 14.6 10*3/uL — AB (ref 3.6–11.0)
WBC: 20.5 10*3/uL — ABNORMAL HIGH (ref 3.6–11.0)

## 2013-05-08 LAB — BASIC METABOLIC PANEL
Anion Gap: 8 (ref 7–16)
Anion Gap: 12 (ref 7–16)
BUN: 13 mg/dL (ref 7–18)
BUN: 13 mg/dL (ref 7–18)
CALCIUM: 8.5 mg/dL (ref 8.5–10.1)
Calcium, Total: 8.2 mg/dL — ABNORMAL LOW (ref 8.5–10.1)
Chloride: 112 mmol/L — ABNORMAL HIGH (ref 98–107)
Chloride: 114 mmol/L — ABNORMAL HIGH (ref 98–107)
Co2: 22 mmol/L (ref 21–32)
Co2: 19 mmol/L — ABNORMAL LOW (ref 21–32)
Creatinine: 0.68 mg/dL (ref 0.60–1.30)
Creatinine: 0.83 mg/dL (ref 0.60–1.30)
EGFR (African American): >60
EGFR (Non-African Amer.): >60
EGFR (Non-African Amer.): >60
GLUCOSE: 112 mg/dL — AB (ref 65–99)
GLUCOSE: 105 mg/dL — AB (ref 65–99)
OSMOLALITY: 284 (ref 275–301)
OSMOLALITY: 289 (ref 275–301)
POTASSIUM: 2.8 mmol/L — AB (ref 3.5–5.1)
Potassium: 3.2 mmol/L — ABNORMAL LOW (ref 3.5–5.1)
Sodium: 142 mmol/L (ref 136–145)
Sodium: 145 mmol/L (ref 136–145)

## 2013-05-08 LAB — APTT
Activated PTT: 43.2 secs — ABNORMAL HIGH (ref 23.6–35.9)
Activated PTT: 34.9 secs (ref 23.6–35.9)

## 2013-05-08 LAB — PROTIME-INR
INR: 1.4
Prothrombin Time: 16.6 secs — ABNORMAL HIGH (ref 11.5–14.7)

## 2013-05-08 LAB — HEMOGLOBIN: HGB: 9.2 g/dL — ABNORMAL LOW (ref 12.0–16.0)

## 2013-05-08 NOTE — Telephone Encounter (Signed)
Message copied by Carson Myrtle on Tue May 08, 2013  8:22 AM ------      Message from: Christene Lye      Created: Fri May 04, 2013  6:51 AM       Inform pt labs are normal. F/u as needed ------

## 2013-05-08 NOTE — Telephone Encounter (Signed)
Notified patient husband as instructed. Discussed follow-up appointments as needed. He states she is at [facility] having a procedure today because she had a blockage in her leg from being off the [place] being followed by Dr Lucky Cowboy.

## 2013-05-09 LAB — CBC WITH DIFFERENTIAL/PLATELET
BASOS ABS: 0.1 10*3/uL (ref 0.0–0.1)
BASOS PCT: 0.3 %
EOS ABS: 0 10*3/uL (ref 0.0–0.7)
Eosinophil %: 0 %
HCT: 25.2 % — ABNORMAL LOW (ref 35.0–47.0)
HGB: 8 g/dL — ABNORMAL LOW (ref 12.0–16.0)
Lymphocyte #: 2 10*3/uL (ref 1.0–3.6)
Lymphocyte %: 12.9 %
MCH: 29.5 pg (ref 26.0–34.0)
MCHC: 31.9 g/dL — ABNORMAL LOW (ref 32.0–36.0)
MCV: 93 fL (ref 80–100)
MONOS PCT: 7 %
Monocyte #: 1.1 x10 3/mm — ABNORMAL HIGH (ref 0.2–0.9)
NEUTROS PCT: 79.8 %
Neutrophil #: 12.5 10*3/uL — ABNORMAL HIGH (ref 1.4–6.5)
Platelet: 215 10*3/uL (ref 150–440)
RBC: 2.72 10*6/uL — AB (ref 3.80–5.20)
RDW: 14.4 % (ref 11.5–14.5)
WBC: 15.7 10*3/uL — AB (ref 3.6–11.0)

## 2013-05-09 LAB — BASIC METABOLIC PANEL
Anion Gap: 11 (ref 7–16)
Anion Gap: 7 (ref 7–16)
BUN: 14 mg/dL (ref 7–18)
BUN: 16 mg/dL (ref 7–18)
CALCIUM: 7.1 mg/dL — AB (ref 8.5–10.1)
CREATININE: 1.06 mg/dL (ref 0.60–1.30)
Calcium, Total: 7.4 mg/dL — ABNORMAL LOW (ref 8.5–10.1)
Chloride: 106 mmol/L (ref 98–107)
Chloride: 107 mmol/L (ref 98–107)
Co2: 22 mmol/L (ref 21–32)
Co2: 22 mmol/L (ref 21–32)
Creatinine: 1.07 mg/dL (ref 0.60–1.30)
EGFR (African American): 57 — ABNORMAL LOW
GFR CALC AF AMER: 56 — AB
GFR CALC NON AF AMER: 48 — AB
GFR CALC NON AF AMER: 49 — AB
Glucose: 101 mg/dL — ABNORMAL HIGH (ref 65–99)
Glucose: 125 mg/dL — ABNORMAL HIGH (ref 65–99)
OSMOLALITY: 278 (ref 275–301)
OSMOLALITY: 275 (ref 275–301)
POTASSIUM: 2.7 mmol/L — AB (ref 3.5–5.1)
Potassium: 3.2 mmol/L — ABNORMAL LOW (ref 3.5–5.1)
SODIUM: 136 mmol/L (ref 136–145)
Sodium: 139 mmol/L (ref 136–145)

## 2013-05-09 LAB — PROTIME-INR
INR: 1.1
Prothrombin Time: 14.4 secs (ref 11.5–14.7)

## 2013-05-09 LAB — STOOL CULTURE

## 2013-05-09 LAB — APTT: ACTIVATED PTT: 34.3 s (ref 23.6–35.9)

## 2013-05-09 LAB — HEMOGLOBIN: HGB: 6.7 g/dL — ABNORMAL LOW (ref 12.0–16.0)

## 2013-05-10 LAB — COMPREHENSIVE METABOLIC PANEL
ANION GAP: 8 (ref 7–16)
AST: 67 U/L — AB (ref 15–37)
Albumin: 2 g/dL — ABNORMAL LOW (ref 3.4–5.0)
Alkaline Phosphatase: 63 U/L
BUN: 13 mg/dL (ref 7–18)
Bilirubin,Total: 0.4 mg/dL (ref 0.2–1.0)
CREATININE: 0.85 mg/dL (ref 0.60–1.30)
Calcium, Total: 7.4 mg/dL — ABNORMAL LOW (ref 8.5–10.1)
Chloride: 107 mmol/L (ref 98–107)
Co2: 22 mmol/L (ref 21–32)
EGFR (African American): >60
EGFR (Non-African Amer.): >60
Glucose: 126 mg/dL — ABNORMAL HIGH (ref 65–99)
OSMOLALITY: 275 (ref 275–301)
Potassium: 2.5 mmol/L — CL (ref 3.5–5.1)
SGPT (ALT): 12 U/L (ref 12–78)
Sodium: 137 mmol/L (ref 136–145)
Total Protein: 4.9 g/dL — ABNORMAL LOW (ref 6.4–8.2)

## 2013-05-10 LAB — CBC WITH DIFFERENTIAL/PLATELET
BASOS ABS: 0.1 10*3/uL (ref 0.0–0.1)
Basophil %: 0.5 %
Eosinophil #: 0.1 10*3/uL (ref 0.0–0.7)
Eosinophil %: 1 %
HCT: 28.4 % — ABNORMAL LOW (ref 35.0–47.0)
HGB: 9.2 g/dL — AB (ref 12.0–16.0)
LYMPHS ABS: 0.7 10*3/uL — AB (ref 1.0–3.6)
LYMPHS PCT: 6.5 %
MCH: 28.3 pg (ref 26.0–34.0)
MCHC: 32.5 g/dL (ref 32.0–36.0)
MCV: 87 fL (ref 80–100)
Monocyte #: 0.7 x10 3/mm (ref 0.2–0.9)
Monocyte %: 6.8 %
NEUTROS PCT: 85.2 %
Neutrophil #: 8.8 10*3/uL — ABNORMAL HIGH (ref 1.4–6.5)
Platelet: 139 10*3/uL — ABNORMAL LOW (ref 150–440)
RBC: 3.26 10*6/uL — ABNORMAL LOW (ref 3.80–5.20)
RDW: 18.1 % — AB (ref 11.5–14.5)
WBC: 10.3 10*3/uL (ref 3.6–11.0)

## 2013-05-10 LAB — BASIC METABOLIC PANEL
ANION GAP: 10 (ref 7–16)
BUN: 11 mg/dL (ref 7–18)
CALCIUM: 7.8 mg/dL — AB (ref 8.5–10.1)
Chloride: 104 mmol/L (ref 98–107)
Co2: 22 mmol/L (ref 21–32)
Creatinine: 0.96 mg/dL (ref 0.60–1.30)
EGFR (African American): >60
EGFR (Non-African Amer.): 55 — ABNORMAL LOW
Glucose: 158 mg/dL — ABNORMAL HIGH (ref 65–99)
Osmolality: 275 (ref 275–301)
Potassium: 3.2 mmol/L — ABNORMAL LOW (ref 3.5–5.1)
Sodium: 136 mmol/L (ref 136–145)

## 2013-05-10 LAB — HEMOGLOBIN
HGB: 9 g/dL — ABNORMAL LOW (ref 12.0–16.0)
HGB: 9.5 g/dL — AB (ref 12.0–16.0)

## 2013-05-11 LAB — CBC WITH DIFFERENTIAL/PLATELET
BASOS ABS: 0 10*3/uL (ref 0.0–0.1)
BASOS PCT: 0.4 %
Eosinophil #: 0.1 10*3/uL (ref 0.0–0.7)
Eosinophil %: 1.6 %
HCT: 27.4 % — ABNORMAL LOW (ref 35.0–47.0)
HGB: 8.9 g/dL — ABNORMAL LOW (ref 12.0–16.0)
LYMPHS PCT: 16.4 %
Lymphocyte #: 1.4 10*3/uL (ref 1.0–3.6)
MCH: 28.5 pg (ref 26.0–34.0)
MCHC: 32.7 g/dL (ref 32.0–36.0)
MCV: 87 fL (ref 80–100)
Monocyte #: 0.8 x10 3/mm (ref 0.2–0.9)
Monocyte %: 9.1 %
NEUTROS ABS: 6.1 10*3/uL (ref 1.4–6.5)
Neutrophil %: 72.5 %
Platelet: 155 10*3/uL (ref 150–440)
RBC: 3.13 10*6/uL — AB (ref 3.80–5.20)
RDW: 17.9 % — ABNORMAL HIGH (ref 11.5–14.5)
WBC: 8.3 10*3/uL (ref 3.6–11.0)

## 2013-05-11 LAB — BASIC METABOLIC PANEL
Anion Gap: 6 — ABNORMAL LOW (ref 7–16)
BUN: 8 mg/dL (ref 7–18)
CHLORIDE: 107 mmol/L (ref 98–107)
Calcium, Total: 7.7 mg/dL — ABNORMAL LOW (ref 8.5–10.1)
Co2: 24 mmol/L (ref 21–32)
Creatinine: 0.62 mg/dL (ref 0.60–1.30)
EGFR (African American): >60
EGFR (Non-African Amer.): >60
GLUCOSE: 97 mg/dL (ref 65–99)
Osmolality: 272 (ref 275–301)
Potassium: 3.5 mmol/L (ref 3.5–5.1)
Sodium: 137 mmol/L (ref 136–145)

## 2013-05-11 LAB — HEMOGLOBIN: HGB: 9.7 g/dL — ABNORMAL LOW (ref 12.0–16.0)

## 2013-05-11 LAB — PATHOLOGY REPORT

## 2013-05-11 LAB — MAGNESIUM: Magnesium: 1.3 mg/dL — ABNORMAL LOW

## 2013-05-12 LAB — BASIC METABOLIC PANEL
Anion Gap: 5 — ABNORMAL LOW (ref 7–16)
BUN: 10 mg/dL (ref 7–18)
CO2: 25 mmol/L (ref 21–32)
Calcium, Total: 7.9 mg/dL — ABNORMAL LOW (ref 8.5–10.1)
Chloride: 109 mmol/L — ABNORMAL HIGH (ref 98–107)
Creatinine: 0.57 mg/dL — ABNORMAL LOW (ref 0.60–1.30)
EGFR (African American): >60
Glucose: 94 mg/dL (ref 65–99)
Osmolality: 276 (ref 275–301)
Potassium: 3.9 mmol/L (ref 3.5–5.1)
Sodium: 139 mmol/L (ref 136–145)

## 2013-05-12 LAB — HEMOGLOBIN: HGB: 9 g/dL — ABNORMAL LOW (ref 12.0–16.0)

## 2013-05-13 LAB — CBC WITH DIFFERENTIAL/PLATELET
BASOS PCT: 0.8 %
Basophil #: 0 10*3/uL (ref 0.0–0.1)
EOS ABS: 0.2 10*3/uL (ref 0.0–0.7)
Eosinophil %: 3.9 %
HCT: 27.3 % — ABNORMAL LOW (ref 35.0–47.0)
HGB: 8.8 g/dL — ABNORMAL LOW (ref 12.0–16.0)
Lymphocyte #: 1.3 10*3/uL (ref 1.0–3.6)
Lymphocyte %: 21.3 %
MCH: 28.5 pg (ref 26.0–34.0)
MCHC: 32.1 g/dL (ref 32.0–36.0)
MCV: 89 fL (ref 80–100)
MONO ABS: 0.8 x10 3/mm (ref 0.2–0.9)
MONOS PCT: 12.3 %
Neutrophil #: 3.8 10*3/uL (ref 1.4–6.5)
Neutrophil %: 61.7 %
Platelet: 199 10*3/uL (ref 150–440)
RBC: 3.08 10*6/uL — AB (ref 3.80–5.20)
RDW: 17.1 % — ABNORMAL HIGH (ref 11.5–14.5)
WBC: 6.1 10*3/uL (ref 3.6–11.0)

## 2013-05-13 LAB — BASIC METABOLIC PANEL
Anion Gap: 7 (ref 7–16)
BUN: 8 mg/dL (ref 7–18)
CALCIUM: 8.2 mg/dL — AB (ref 8.5–10.1)
Chloride: 110 mmol/L — ABNORMAL HIGH (ref 98–107)
Co2: 25 mmol/L (ref 21–32)
Creatinine: 0.5 mg/dL — ABNORMAL LOW (ref 0.60–1.30)
EGFR (African American): >60
GLUCOSE: 89 mg/dL (ref 65–99)
Osmolality: 281 (ref 275–301)
Potassium: 3.8 mmol/L (ref 3.5–5.1)
SODIUM: 142 mmol/L (ref 136–145)

## 2013-05-14 LAB — CBC WITH DIFFERENTIAL/PLATELET
BASOS PCT: 1.1 %
Basophil #: 0.1 10*3/uL (ref 0.0–0.1)
EOS PCT: 5.3 %
Eosinophil #: 0.3 10*3/uL (ref 0.0–0.7)
HCT: 26.8 % — AB (ref 35.0–47.0)
HGB: 8.8 g/dL — ABNORMAL LOW (ref 12.0–16.0)
LYMPHS PCT: 21.5 %
Lymphocyte #: 1.2 10*3/uL (ref 1.0–3.6)
MCH: 28.8 pg (ref 26.0–34.0)
MCHC: 32.7 g/dL (ref 32.0–36.0)
MCV: 88 fL (ref 80–100)
MONO ABS: 0.8 x10 3/mm (ref 0.2–0.9)
Monocyte %: 13.4 %
Neutrophil #: 3.4 10*3/uL (ref 1.4–6.5)
Neutrophil %: 58.7 %
Platelet: 246 10*3/uL (ref 150–440)
RBC: 3.04 10*6/uL — AB (ref 3.80–5.20)
RDW: 16.9 % — AB (ref 11.5–14.5)
WBC: 5.7 10*3/uL (ref 3.6–11.0)

## 2013-05-14 LAB — BASIC METABOLIC PANEL
ANION GAP: 6 — AB (ref 7–16)
BUN: 9 mg/dL (ref 7–18)
CO2: 25 mmol/L (ref 21–32)
CREATININE: 0.77 mg/dL (ref 0.60–1.30)
Calcium, Total: 8.6 mg/dL (ref 8.5–10.1)
Chloride: 108 mmol/L — ABNORMAL HIGH (ref 98–107)
EGFR (Non-African Amer.): >60
GLUCOSE: 87 mg/dL (ref 65–99)
Osmolality: 276 (ref 275–301)
POTASSIUM: 4 mmol/L (ref 3.5–5.1)
SODIUM: 139 mmol/L (ref 136–145)

## 2013-05-15 DIAGNOSIS — I872 Venous insufficiency (chronic) (peripheral): Secondary | ICD-10-CM

## 2013-05-17 DIAGNOSIS — J45909 Unspecified asthma, uncomplicated: Secondary | ICD-10-CM

## 2013-05-17 DIAGNOSIS — I998 Other disorder of circulatory system: Secondary | ICD-10-CM

## 2013-05-17 DIAGNOSIS — K219 Gastro-esophageal reflux disease without esophagitis: Secondary | ICD-10-CM

## 2013-05-17 DIAGNOSIS — I10 Essential (primary) hypertension: Secondary | ICD-10-CM

## 2013-05-21 DIAGNOSIS — L03319 Cellulitis of trunk, unspecified: Secondary | ICD-10-CM

## 2013-05-21 DIAGNOSIS — L02219 Cutaneous abscess of trunk, unspecified: Secondary | ICD-10-CM

## 2013-06-08 ENCOUNTER — Ambulatory Visit: Payer: Self-pay | Admitting: Vascular Surgery

## 2013-06-12 LAB — WOUND CULTURE

## 2013-11-02 ENCOUNTER — Ambulatory Visit: Payer: Self-pay | Admitting: Internal Medicine

## 2013-11-12 ENCOUNTER — Encounter: Payer: Self-pay | Admitting: General Surgery

## 2014-01-26 ENCOUNTER — Observation Stay: Payer: Self-pay | Admitting: Internal Medicine

## 2014-01-26 LAB — URINALYSIS, COMPLETE
BILIRUBIN, UR: NEGATIVE
Bacteria: NONE SEEN
Blood: NEGATIVE
Glucose,UR: NEGATIVE mg/dL (ref 0–75)
Hyaline Cast: 2
KETONE: NEGATIVE
Leukocyte Esterase: NEGATIVE
Nitrite: NEGATIVE
Ph: 5 (ref 4.5–8.0)
Protein: NEGATIVE
RBC,UR: NONE SEEN /HPF (ref 0–5)
SPECIFIC GRAVITY: 1.004 (ref 1.003–1.030)
Squamous Epithelial: 1
WBC UR: NONE SEEN /HPF (ref 0–5)

## 2014-01-26 LAB — CBC
HCT: 34 % — ABNORMAL LOW (ref 35.0–47.0)
HGB: 10.8 g/dL — ABNORMAL LOW (ref 12.0–16.0)
MCH: 25.4 pg — ABNORMAL LOW (ref 26.0–34.0)
MCHC: 31.7 g/dL — ABNORMAL LOW (ref 32.0–36.0)
MCV: 80 fL (ref 80–100)
PLATELETS: 337 10*3/uL (ref 150–440)
RBC: 4.24 10*6/uL (ref 3.80–5.20)
RDW: 18.4 % — ABNORMAL HIGH (ref 11.5–14.5)
WBC: 14 10*3/uL — ABNORMAL HIGH (ref 3.6–11.0)

## 2014-01-26 LAB — BASIC METABOLIC PANEL
Anion Gap: 10 (ref 7–16)
BUN: 25 mg/dL — ABNORMAL HIGH (ref 7–18)
CALCIUM: 9.5 mg/dL (ref 8.5–10.1)
Chloride: 97 mmol/L — ABNORMAL LOW (ref 98–107)
Co2: 26 mmol/L (ref 21–32)
Creatinine: 0.96 mg/dL (ref 0.60–1.30)
EGFR (African American): >60
EGFR (Non-African Amer.): 59 — ABNORMAL LOW
Glucose: 102 mg/dL — ABNORMAL HIGH (ref 65–99)
Osmolality: 271 (ref 275–301)
POTASSIUM: 3.3 mmol/L — AB (ref 3.5–5.1)
SODIUM: 133 mmol/L — AB (ref 136–145)

## 2014-01-26 LAB — PRO B NATRIURETIC PEPTIDE: B-TYPE NATIURETIC PEPTID: 249 pg/mL (ref 0–450)

## 2014-01-27 LAB — BASIC METABOLIC PANEL
ANION GAP: 10 (ref 7–16)
BUN: 18 mg/dL (ref 7–18)
CREATININE: 0.84 mg/dL (ref 0.60–1.30)
Calcium, Total: 9.1 mg/dL (ref 8.5–10.1)
Chloride: 101 mmol/L (ref 98–107)
Co2: 27 mmol/L (ref 21–32)
GLUCOSE: 85 mg/dL (ref 65–99)
Osmolality: 277 (ref 275–301)
Potassium: 2.9 mmol/L — ABNORMAL LOW (ref 3.5–5.1)
Sodium: 138 mmol/L (ref 136–145)

## 2014-01-27 LAB — CBC WITH DIFFERENTIAL/PLATELET
Basophil #: 0.1 10*3/uL (ref 0.0–0.1)
Basophil %: 0.5 %
EOS ABS: 0 10*3/uL (ref 0.0–0.7)
Eosinophil %: 0.2 %
HCT: 30.5 % — AB (ref 35.0–47.0)
HGB: 9.7 g/dL — ABNORMAL LOW (ref 12.0–16.0)
Lymphocyte #: 1.7 10*3/uL (ref 1.0–3.6)
Lymphocyte %: 16.5 %
MCH: 25.6 pg — ABNORMAL LOW (ref 26.0–34.0)
MCHC: 31.8 g/dL — AB (ref 32.0–36.0)
MCV: 81 fL (ref 80–100)
MONO ABS: 0.8 x10 3/mm (ref 0.2–0.9)
Monocyte %: 7.6 %
NEUTROS ABS: 8 10*3/uL — AB (ref 1.4–6.5)
Neutrophil %: 75.2 %
Platelet: 276 10*3/uL (ref 150–440)
RBC: 3.79 10*6/uL — ABNORMAL LOW (ref 3.80–5.20)
RDW: 17.6 % — AB (ref 11.5–14.5)
WBC: 10.6 10*3/uL (ref 3.6–11.0)

## 2014-01-28 LAB — BASIC METABOLIC PANEL
Anion Gap: 9 (ref 7–16)
BUN: 15 mg/dL (ref 7–18)
CALCIUM: 9.2 mg/dL (ref 8.5–10.1)
CREATININE: 0.89 mg/dL (ref 0.60–1.30)
Chloride: 106 mmol/L (ref 98–107)
Co2: 25 mmol/L (ref 21–32)
EGFR (African American): >60
EGFR (Non-African Amer.): >60
GLUCOSE: 87 mg/dL (ref 65–99)
Osmolality: 280 (ref 275–301)
POTASSIUM: 3.3 mmol/L — AB (ref 3.5–5.1)
Sodium: 140 mmol/L (ref 136–145)

## 2014-01-28 LAB — CBC WITH DIFFERENTIAL/PLATELET
Basophil #: 0.1 10*3/uL (ref 0.0–0.1)
Basophil %: 0.8 %
Eosinophil #: 0.1 10*3/uL (ref 0.0–0.7)
Eosinophil %: 1.3 %
HCT: 29.2 % — ABNORMAL LOW (ref 35.0–47.0)
HGB: 9.3 g/dL — AB (ref 12.0–16.0)
Lymphocyte #: 1.5 10*3/uL (ref 1.0–3.6)
Lymphocyte %: 19.4 %
MCH: 25.6 pg — AB (ref 26.0–34.0)
MCHC: 31.9 g/dL — ABNORMAL LOW (ref 32.0–36.0)
MCV: 80 fL (ref 80–100)
MONO ABS: 0.8 x10 3/mm (ref 0.2–0.9)
Monocyte %: 10.7 %
NEUTROS PCT: 67.8 %
Neutrophil #: 5.2 10*3/uL (ref 1.4–6.5)
Platelet: 255 10*3/uL (ref 150–440)
RBC: 3.63 10*6/uL — ABNORMAL LOW (ref 3.80–5.20)
RDW: 17.7 % — ABNORMAL HIGH (ref 11.5–14.5)
WBC: 7.6 10*3/uL (ref 3.6–11.0)

## 2014-01-28 LAB — MAGNESIUM: Magnesium: 1.7 mg/dL — ABNORMAL LOW

## 2014-01-31 LAB — CULTURE, BLOOD (SINGLE)

## 2014-05-03 NOTE — Consult Note (Signed)
PATIENT NAME:  Natasha [name], Natasha [name] MR#:  [id number] DATE OF BIRTH:  09/07/1928  DATE OF CONSULTATION:  11/01/2012  CONSULTING PHYSICIAN:  Ocie Cornfield. Ouida Sills, MD  CHIEF COMPLAINT:  Leg pain.   HISTORY OF PRESENT ILLNESS: [name] is an 81 year old female, normally a primary care patient of Dr. Emily Filbert, who has peripheral vascular disease. Had a stent placed in her right leg in the past, as well as a thrombectomy of the left superficial femoral artery on 09/08/2012. She had recurrent pain to the leg, and was evaluated by Vascular Surgery. She was taken to the vascular lab today, mechanically could not be taken out, and she is on alteplase and heparin now in the Intensive Care Unit. I was asked to consult for the medical problems. The patient is extremely hard of hearing and I cannot get further history from her, history is from nursing staff, mainly. She is in a lot of pain now. Said she is near tears. Has had some fentanyl,  has had Dilaudid ordered, and is about to obtain such.   REVIEW OF SYSTEMS:  Could not do secondary to severe hearing deficit.   PAST MEDICAL HISTORY: 1.  Osteoarthritis.  2.  Hypertension.  3.  GERD.   4.  History of "insignificant" coronary artery disease. 5.  Diverticulosis. 6.  Osteoporosis.   PAST SURGICAL HISTORY: Appendectomy, hysterectomy, anal fissure, right shoulder arthroscopy, cochlear implant in the right in 2009. Otherwise as above.   SOCIAL HISTORY: No tobacco or alcohol. She lives at [facility]. Is married. Is retired.   FAMILY HISTORY: Polyps and colorectal cancer as noted.   MEDICATIONS: Per last noted at [facility] with Dr. Sabra Heck include Nasonex 2 sprays daily, Singulair 10 mg daily, Nexium 40 mg daily, ProAir 1 to 2 t.i.d. Pulmicort 2 puffs b.i.d., diltiazem extended release 180 daily, Percocet p.r.n., simvastatin 20 mg daily, Topamax 50 mg b.i.d., Xarelto 20 mg daily, Lasix 20 daily, potassium chloride 20 mEq possibly t.i.d.    ALLERGIES: CODEINE has unspecified effect. HYDROCODONE has unspecified effect. OXYCODONE causes unspecified issue.  TRAMADOL causes nausea.   PHYSICAL EXAMINATION: GENERAL: An elderly female lying in bed in the Intensive Care Unit, in moderate distress secondary to pain in her left leg. VITAL SIGNS: Most recently show blood pressure 120/60, respirations 16, pulse 84, temperature 98.7, has 99% oxygenation pulse ox on 2 liters nasal cannula.  HEENT: Oropharynx clear. Very hard of hearing. Conjunctivae normal.  NECK: No bruits, thyromegaly, adenopathy, or JVD.  CHEST:  Clear to auscultation and percussion.  HEART: Regular rate and rhythm, without murmurs or gallops.  ABDOMEN: Soft, flat and nontender, without hepatomegaly.  EXTREMITIES: Left foot and lower leg are extremely cold, somewhat cyanotic. Otherwise, no clubbing, cyanosis, edema noted on the right or further up the left leg.  NEUROLOGIC: Nonfocal, other than the hearing.  SKIN: No lesions noted.   LABS:  Glucose 100, BUN 19, creatinine 0.85.   Hemoglobin was 9.4.   ASSESSMENT AND PLAN: 1.  Ischemic left leg: Difficult situation, does not seem to be a surgical candidate. Agree with awaiting further attempts at percutaneous intervention tomorrow. Agree with aggressive pain control with Dilaudid initially.  2.  Anemia, history of, per chart. Hemoglobin is 9.4 now, which is relatively stable. Will need to be watched closely on the thrombolytics and anticoagulants.  3.  Asthma, seems stable. Will follow along. Nebs p.r.n. may need to be considered.  Thank you very much for the consult. Will follow along with you.  ____________________________ Ocie Cornfield. Ouida Sills, MD mwa:mr D: 11/01/2012 19:30:00 ET T: 11/01/2012 19:46:09 ET JOB#: [id number]  cc: Ocie Cornfield. Ouida Sills, MD, <Dictator> Kirk Ruths MD ELECTRONICALLY SIGNED 11/03/2012 10:58

## 2014-05-03 NOTE — Op Note (Signed)
PATIENT NAME:  Natasha [name], NORBUT MR#:  [id number] DATE OF BIRTH:  July 18, 1928  DATE OF PROCEDURE:  02/14/2012  PREOPERATIVE DIAGNOSES:  1. Peripheral arterial disease with claudication, left lower extremity. 2.  Hypertension.  3.  Chronic obstructive pulmonary disease.   POSTOPERATIVE DIAGNOSES: 1. Peripheral arterial disease with claudication, left lower extremity. 2.  Hypertension.  3.  Chronic obstructive pulmonary disease.   PROCEDURE:  1.  Catheter placement into left popliteal artery from right femoral approach.  2.  Aortogram and selective left lower extremity angiogram.  3.  Percutaneous transluminal angioplasty of mid to distal superficial femoral artery and proximal popliteal artery with 5 mm diameter angioplasty balloon.  4.  StarClose closure device, right femoral artery.   SURGEON: Leotis Pain, M.D.   ANESTHESIA: Local with moderate conscious sedation.   ESTIMATED BLOOD LOSS: Minimal.   FLUOROSCOPY TIME: 2.1 minutes.  CONTRAST: 45 mL.   INDICATION FOR PROCEDURE: This is an 81 year old white female with peripheral arterial disease. She has undergone previous revascularization of the left lower extremity. She has had a drop in her ABI and worsening of her symptoms that are now lifestyle limiting in the left lower extremity. She desires intervention. Risks and benefits were discussed. Informed consent was obtained.   DESCRIPTION OF PROCEDURE: The patient was brought to the vascular interventional radiology suite. Groins were shaved and prepped and a sterile surgical field was created. The right femoral head was localized with fluoroscopy and the right femoral artery was accessed without difficulty with Seldinger needle. A J-wire and 5-French sheath were then placed. Pigtail catheter was placed in the aorta at the L1-L2 level and AP aortogram was performed. This showed single renal arteries bilaterally without high-grade stenosis. The aorta and iliac segments appeared patent  without high-grade stenosis. Then taken to the aortic bifurcation and advanced to the left femoral head and selective left lower extremity angiogram was then performed, which showed a patent common femoral and profunda femoris artery. The superficial femoral artery was reasonably small in caliber, but not stenotic proximally. In the mid to distal segments about 8 to 10 cm above the previously placed stent, there was a high-grade stenosis down into the stent with intimal hyperplasia into the first half of the stent with the distal portion of the stent in the popliteal artery. The popliteal artery itself was patent. There was then, what appeared to be, two-vessel runoff distally with the anterior tibial artery being dominant. The patient was systemically heparinized. A 6-French Ansel sheath was placed over a Terumo advantage wire. A single cross the lesion without difficulty with the advantage wire and confirmed intraluminal flow with a Kumpe catheter in the popliteal artery. I then replaced the wire. Percutaneous transluminal angioplasty performed with 5 mm diameter angioplasty balloon. Wastes were taken, which resolved with angioplasty. Completion angiogram showed good flow through the intervened upon area. There was non-flow-limiting dissection in the mid to distal superficial femoral artery. The flow was brisk through there and there was not significant stenosis associated with this, so this was not treated with stent placement. At this point, I elected to terminate the procedure. The sheath was pulled back to the ipsilateral axillary artery and oblique arteriogram was performed. StarClose closure device was deployed in the usual fashion with excellent hemostatic result. The patient tolerated the procedure well and was taken to the recovery room in stable condition.   ____________________________ Algernon Huxley, MD jsd:aw D: 02/14/2012 15:21:35 ET T: 02/15/2012 09:14:37 ET JOB#: [id number]  cc: Erskine Squibb.  Lucky Cowboy,  MD, <Dictator> Algernon Huxley MD ELECTRONICALLY SIGNED 02/28/2012 16:55

## 2014-05-03 NOTE — Op Note (Signed)
PATIENT NAME:  Natasha [name], CUADROS MR#:  [id number] DATE OF BIRTH:  1928-07-14  DATE OF PROCEDURE:  09/08/2012  PREOPERATIVE DIAGNOSES: 1.  Ischemic left lower extremity with rest pain.  2.  Status post placement of infusion catheter for overnight thrombolysis. 3.  Chronic obstructive pulmonary disease.  4. Arthritis.  5.  Hypertension.   POSTOPERATIVE DIAGNOSES: 1.  Ischemic left lower extremity with rest pain.  2.  Status post placement of infusion catheter for overnight thrombolysis. 3.  Chronic obstructive pulmonary disease.  4. Arthritis.  5.  Hypertension.   PROCEDURES: 1.  Angiogram through existing catheter, left lower extremity.  2.  Infusion for thrombolysis through existing catheter, left lower extremity.  3.  Mechanical rheolytic thrombectomy of the left superficial femoral artery and popliteal artery as well as the tibioperoneal trunk and peroneal artery.  4.  Percutaneous transluminal angioplasty of proximal superficial femoral artery with 6 mm diameter angioplasty balloon.  5.  Percutaneous transluminal angioplasty of mid superficial femoral artery with 6 mm diameter angioplasty balloon.  6.  Percutaneous transluminal angioplasty of above-knee popliteal artery with 6 mm diameter angioplasty balloon.  7.  StarClose closure device, right femoral artery.   SURGEON: Algernon Huxley, M.D.   ANESTHESIA: Local with moderate conscious sedation.   ESTIMATED BLOOD LOSS: Minimal.   INDICATION FOR PROCEDURE: This is an 81 year old white female with limb threatening ischemia of the left lower extremity. She had a lytic catheter placed overnight for overnight thrombolysis. She is brought back today for further treatment. Risks and benefits were discussed. Informed consent was obtained.   DESCRIPTION OF PROCEDURE: The patient was brought to the vascular and interventional radiology suite. The right groin and existing catheter and sheath were sterilely prepped and draped, and a sterile  surgical field was created. Infusion was continued through the thrombolysis catheter until its removal during this procedure. It was removed. Imaging was performed through the sheath. This showed some residual thrombus in the proximal SFA and in the above-knee popliteal artery that was flow-limiting but not occlusive. There was also an area of narrowing between proximal SFA stent and mid SFA stent. Mechanical rheolytic thrombectomy was performed from the origin of the superficial femoral artery down to the proximal peroneal artery to treat areas of thrombosis and then balloon angioplasty was performed on the 2 lesions in the superficial femoral artery, which were separate and distinct, as well as the above-knee popliteal lesion. A 6 mm diameter angioplasty balloon was inflated 3 times to treat all three lesions. Completion angiogram showed marked improvement in the flow of the left lower extremity. There was still some residual thrombus in the proximal superficial femoral artery that was not flow limiting and not an area which would do well with stent placement. The mid superficial femoral artery between stents was widely patent, and the distal area in the popliteal artery, at the bottom of the previously placed stents, was now widely patent. There was now 1 vessel runoff predominantly through the anterior tibial artery, which was brisk into the foot. The tibioperoneal trunk and peroneal and posterior tibial arteries appeared thrombosed. At this point, she had in-line flow, and I elected to terminate the procedure. The sheath was pulled back to the ipsilateral external iliac artery and oblique arteriogram was performed. StarClose closure device was deployed in the usual fashion with excellent hemostatic result. The patient tolerated the procedure well and was taken to the recovery room in stable condition.  ____________________________ Algernon Huxley, MD jsd:sb D: 09/08/2012 13:18:46  ET T: 09/08/2012 13:36:06  ET JOB#: [id number]  cc: Algernon Huxley, MD, <Dictator> Rusty Aus, MD Algernon Huxley MD ELECTRONICALLY SIGNED 09/13/2012 16:16

## 2014-05-03 NOTE — Op Note (Signed)
PATIENT NAME:  Natasha [name], Natasha [name] MR#:  [id number] DATE OF BIRTH:  1928-07-18  DATE OF PROCEDURE:  11/02/2012  PREOPERATIVE DIAGNOSES:  1.  Peripheral arterial disease with recurrent thrombosis of the left lower extremity causing rest pain.  2.  Hypertension.  3.  Chronic obstructive pulmonary disease.  4.  Hyperlipidemia.   POSTOPERATIVE DIAGNOSES: 1.  Peripheral arterial disease with recurrent thrombosis of the left lower extremity causing rest pain.  2.  Hypertension.  3.  Chronic obstructive pulmonary disease.  4.  Hyperlipidemia.   PROCEDURE:  1.  Angiogram through existing catheter, left lower extremity.  2.  Mechanical rheolytic thrombectomy, left lower extremity.  3.  Infusion for thrombolysis, left lower extremity.  4.  StarClose closure device, right femoral artery.   SURGEON: Algernon Huxley, M.D.   ANESTHESIA: Local with moderate conscious sedation.   ESTIMATED BLOOD LOSS: Minimal.   INDICATION FOR PROCEDURE: An 81 year old white female, who was admitted yesterday with acute on chronic left lower extremity ischemia. She has had thrombosis of her previous left lower extremity interventions and had ischemic rest pain. She has been run on overnight thrombolysis. She is brought back for repeat angiography.   DESCRIPTION OF PROCEDURE: The patient's existing catheter and right groin were sterilely prepped and draped and a sterile surgical field was created. The infusion catheter that was parked in the left lower extremity delivered thrombolysis over the past 18 to 24 hours. This catheter was rewired and a selective left lower extremity angiogram was then performed. Initially, there was essentially no flow with thrombosis of the left superficial femoral artery on angiography. I then used the AngioJet Omni catheter for thrombolysis/thrombectomy of the left lower extremity. Mechanical thrombectomy was performed including the entire left superficial femoral artery, left popliteal artery,  left tibioperoneal trunk and peroneal artery. Over 200 mL of effluent returned. Following this, there was opening at the channel and the superficial femoral artery and popliteal artery were now patent. Her tibials had significant spasm, but there was some flow into the tibials. There was some thrombosis within the tibials distally. At this point, I felt we had probably done all we could do from a revascularization standpoint percutaneously and we will try to treat her with an Integrilin drip to remove any residual small volume thrombus in the tibial vessels and keep her SFA and popliteal open as well. StarClose closure device was deployed in the usual fashion with excellent hemostatic result. The patient tolerated the procedure well and was taken to the recovery room in stable condition.   ____________________________ Algernon Huxley, MD jsd:aw D: 11/02/2012 09:13:43 ET T: 11/02/2012 09:37:50 ET JOB#: [id number]  cc: Algernon Huxley, MD, <Dictator> Algernon Huxley MD ELECTRONICALLY SIGNED 11/08/2012 13:51

## 2014-05-03 NOTE — Op Note (Signed)
PATIENT NAME:  Natasha [name], Natasha [name] MR#:  [id number] DATE OF BIRTH:  10-14-1928  DATE OF PROCEDURE:  08/21/2012  PREOPERATIVE DIAGNOSES:  1.  Peripheral arterial disease with claudication, left lower extremity.  2.  Chronic obstructive pulmonary disease.  3.  Hyperlipidemia.  4.  Hypertension.   POSTOPERATIVE DIAGNOSES: 1.  Peripheral arterial disease with claudication, left lower extremity.  2.  Chronic obstructive pulmonary disease.  3.  Hyperlipidemia.  4.  Hypertension.   PROCEDURES:  1.  Ultrasound guidance for vascular access, right femoral artery.  2.  Catheter placed in the left popliteal artery from right femoral approach.  3.  Aortogram and selective left lower extremity angiogram.  4.  Percutaneous transluminal angioplasty of left above-knee popliteal artery and superficial femoral artery with 5 mm diameter angioplasty balloon.  5.  Viabahn stent placement to left above-knee popliteal artery and superficial femoral artery with 6 mm diameter Viabahn stent.  6.  StarClose closure device, right femoral artery.   SURGEON: Algernon Huxley, M.D.   ANESTHESIA: Local with moderate conscious sedation.   ESTIMATED BLOOD LOSS: 25 mL.  CONTRAST USED: 70 mL Visipaque.   INDICATION FOR PROCEDURE: An 81 year old white female with a history of peripheral arterial disease. She has previously undergone intervention for infrainguinal disease of the left lower extremity. She has had return of  left lower extremity claudication symptoms and noninvasive study showed a drop in her perfusion on the left leg. Recurrent stenosis was seen in the left superficial femoral artery. She is brought in for angiography for further evaluation and potential treatment. Risks and benefits were discussed. Informed consent was obtained.   DESCRIPTION OF PROCEDURE: The patient is brought to the vascular and interventional radiology suite. Groins were shaved and prepped and a sterile surgical field was created. The  right femoral head was localized with fluoroscopy and the right femoral artery was accessed without difficult under direct ultrasound guidance with a Seldinger needle. A J-wire and 5-French sheath were placed. Pigtail catheter was placed in the aorta at the L1-L2 level and AP aortogram was performed. This showed no flow-limiting stenosis in the aortoiliac segments and patent renal arteries bilaterally. I then hooked the aortic bifurcation and advanced to the left femoral head and selective left lower extremity angiogram was then performed. This demonstrated the SFA was diseased and occluded some 8 to 10 cm above the previously placed stent. The stent was occluded and she reconstituted the popliteal artery just below the previously placed stent. There was then 2 vessel runoff distally. The patient was systemically heparinized. A 6-French Ansell sheath was placed over a Terumo advantage wire. Using a Terumo advantage wire, a Kumpe catheter and then a CXI catheter, I was able to cross the occlusion and confirm intraluminal flow in the popliteal artery. I then replaced the wire. A 5 mm diameter angioplasty balloon was inflated from just above the knee to the proximal superficial femoral artery. Waists were taken above the old stent, within the stent and at the distal edge of the stent. Following angioplasty, the proximal endpoint was mild to moderately diseased; however, the distal endpoint just at and below the previously placed stent in the proximal popliteal artery was highly stenotic with a dissection. I elected to cover the area with a 6 mm diameter Viabahn stent from the above-knee popliteal artery up to the mid to proximal superficial femoral artery to encompass the previous lesions. A 6 mm diameter x 25 cm length Viabahn stent was placed. This was post dilated  with a 5 mm balloon with an excellent angiographic completion result.   At this point, I elected to terminate the  procedure. The sheath was removed.  StarClose closure device was deployed in the usual fashion with excellent hemostatic result. The patient tolerated the procedure well and was taken to the recovery room in stable condition.  ____________________________ Algernon Huxley, MD jsd:aw D: 08/21/2012 12:03:25 ET T: 08/21/2012 12:29:57 ET JOB#: [id number]  cc: Algernon Huxley, MD, <Dictator> Rusty Aus, MD Algernon Huxley MD ELECTRONICALLY SIGNED 08/28/2012 16:07

## 2014-05-03 NOTE — Discharge Summary (Signed)
PATIENT NAME:  Natasha [name], Natasha [name] MR#:  [id number] DATE OF BIRTH:  05/10/1928  DISPOSITION: Skilled nursing facility/rehab.   BRIEF HISTORY: An 81 year old white female with a history of severe peripheral vascular disease. She had had multiple left lower extremity interventions. She presented earlier this week to the office with ischemic rest pain in the left leg and recurrent thrombosis of her left lower extremity. She is brought in for an attempt at limb salvage and opening in the left lower extremity perfusion.   HOSPITAL COURSE: The patient was taken to the special procedures area where a left lower extremity angiogram was then performed and extensive revascularization attempts were performed. She required overnight thrombolysis with tPA. The following morning we were able to restore flow after overnight thrombolysis and have flow into the foot. She was maintained on an Integrilin drip for 24 hours following the second procedure, and by the morning of October 24, her foot was warm with a weakly palpable pedal pulse and no pain.   She was very weak and deconditioned, and PT did not feel she would be safe to go home, and for this reason, she will be going to a rehab facility short-term. She will need 4 L of continuous oxygen, had previous home oxygen at home prior to this admission, was used mostly at night.   MEDICATIONS: Will include Percocet as needed for pain, Singulair 10 mg daily, calcium and magnesium daily, Topamax 50 mg b.i.d., Nasonex 50 mcg b.i.d., Klor-Con 20 mg daily, hydrochlorothiazide 25 mg daily, simvastatin 10 mg daily, diltiazem 180 mg daily, Pulmicort 180 mcg b.i.d., Nexium 40 mg daily, Aggrenox b.i.d., Pulmicort Flexhaler b.i.d., Reclast 5 mg yearly, ProAir inhaler, multivitamin, glucosamine with chondroitin, pramipexole, cilostazol 100 mg b.i.d., and Xarelto 20 mg daily.   FOLLOWUP: In 2 to 3 weeks in the office with ABIs.   ACTIVITY: As tolerated.   DIET: Regular.   She  will be getting 4 L continuous oxygen which will be weaned off as tolerated.   She will call or contact our office with any fever over 101, wound redness or drainage, severe pain or other issues.    ____________________________ Algernon Huxley, MD jsd:np D: 11/03/2012 15:09:59 ET T: 11/03/2012 15:42:08 ET JOB#: [id number]  cc: Algernon Huxley, MD, <Dictator> Algernon Huxley MD ELECTRONICALLY SIGNED 11/08/2012 13:51

## 2014-05-03 NOTE — Op Note (Signed)
PATIENT NAME:  Natasha [name], Natasha [name] MR#:  [id number] DATE OF BIRTH:  01-10-1929  DATE OF PROCEDURE:  09/07/2012  PREOPERATIVE DIAGNOSES: 1.  Acute left lower extremity ischemia with rest pain.  2.  Peripheral arterial disease, status post previous left lower extremity intervention.  3.  Chronic obstructive pulmonary disease.  4.  Hypertension.  5.  Hyperlipidemia.  POSTOPERATIVE DIAGNOSES: 1.  Acute left lower extremity ischemia with rest pain.  2.  Peripheral arterial disease, status post previous left lower extremity intervention.  3.  Chronic obstructive pulmonary disease.  4.  Hypertension.  5.  Hyperlipidemia.   PROCEDURES: 1.  Ultrasound guidance for vascular access, right femoral artery.  2.  Catheter placement into left peroneal artery from right femoral approach.  3.  Left lower extremity angiogram.  4.  Catheter-directed thrombolysis with 4 mg of t-PA delivered with the AngioJet Proxy catheter.  5.  Mechanical rheolytic thrombectomy with AngioJet catheter.  6.  Placement of catheter for continued overnight thrombolysis from the left superficial femoral artery down to the left peroneal artery.   SURGEON:  Algernon Huxley, M.D.   ANESTHESIA:  Local with sedation.   ESTIMATED BLOOD LOSS:  Minimal.   INDICATION FOR PROCEDURE:  An 81 year old white female who presented with acute left lower extremity ischemia.  She has had previous interventions on this leg and is re-occluded.  She is brought in for an attempt at limb salvage with treatment for her acute lower extremity ischemia.  Risks and benefits were discussed.  Informed consent was obtained.   DESCRIPTION OF PROCEDURE:  The patient is brought to the vascular interventional radiology suite.  Groins were shaved and prepped and a sterile surgical field was created.  Ultrasound was used to visualize a patent right femoral artery.  This was accessed under direct ultrasound guidance without difficulty with Seldinger needle.  A J-wire  and 5 French sheath were placed.  Rim catheter was then used to cross the aortic bifurcation as a recent aortogram had been performed.  Selective left lower extremity angiogram is then performed which showed complete thrombosis of the left superficial femoral artery starting just beyond its origin with reconstitution in the popliteal artery.  I then heparinized the patient, put a 6 [place] Ansell sheath over a Terumo advantage wire and crossed the lesion without difficulty confirming intraluminal flow down in the peroneal artery.  The wire was then replaced, 4 mg of TPA was delivered from the left common femoral artery down to the left peroneal artery, through the superficial femoral and popliteal arteries to lace the clot with TPA.  This was allowed to dwell for between 15 and 20 minutes.  I then performed mechanical rheolytic thrombectomy with the AngioJet catheter over the same vessels.  There was still thrombus residual, although there was some flow limit at this point.  I elected to place a thrombolysis catheter for overnight thrombolysis.  This was a 135 total length, 19 working length catheter that was parked into the peroneal artery distally and started at the origin of the superficial femoral artery.  It was secured with a silk suture as was the sheath and a sterile dressing was placed.  I then placed a central line to have good durable venous access with continuous thrombolysis.  The right neck was sterilely prepped and draped and a sterile surgical field was created.  The right jugular vein was visualized with ultrasound and found to be widely patent.  It was then accessed under direct ultrasound guidance without  difficulty with a Seldinger needle.  A J-wire was placed.  After skin nick and dilatation, the triple lumen catheter was placed over the wire and the wire was removed.  All three lumens withdrew dark red blood and flushed easily with heparinized saline.  It was secured at the skin at about 17 cm  with 3 silk sutures.  Fluoroscopy was used and the catheter tip was parked at the cavoatrial junction.  The patient was then awakened from anesthesia and taken to the recovery room in stable condition having tolerated the procedure well.    ____________________________ Algernon Huxley, MD jsd:ea D: 09/07/2012 17:43:41 ET T: 09/08/2012 02:09:57 ET JOB#: [id number]  cc: Algernon Huxley, MD, <Dictator> Algernon Huxley MD ELECTRONICALLY SIGNED 09/13/2012 16:16

## 2014-05-03 NOTE — Consult Note (Signed)
Brief Consult Note: Diagnosis: stent clothing.   Patient was seen by consultant.   Consult note dictated.   Orders entered.   Discussed with Attending MD.   Comments: 81 yo female with h/o PVD admitted yesterday for day surgery for -Percutaneous transluminal angioplasty of left above-knee popliteal artery and superficial femoral artery with 5 mm diameter angioplasty balloon w/stent placement to left above-knee popliteal artery and superficial femoral artery. comes back today with thrombosis of stent, taken to OR and had respiratory arrest now resolved. -cont integrillin drip.  -asthma: sloumedrol. xopenex as pt is tachycardic. cxr stat as pt had a fever of 102 after arrest r/o aspiration. - arrest due to verset??? will stop any prn. tachy, cont cardizem.  Electronic Signatures: James Ivanoff, Roselie Awkward (MD)  (Signed 12-Aug-14 20:34)  Authored: Brief Consult Note   Last Updated: 12-Aug-14 20:34 by James Ivanoff, Roselie Awkward (MD)

## 2014-05-03 NOTE — Consult Note (Signed)
PATIENT NAME:  Natasha [name], RUDDEN MR#:  [id number] DATE OF BIRTH:  Oct 21, 1928  DATE OF CONSULTATION:  08/22/2012  REFERRING PHYSICIAN:  Hortencia Pilar, MD CONSULTING PHYSICIAN:   Sink, MD  PRIMARY CARE PHYSICIAN:  Dr. Epifania Gore  REASON FOR CONSULTATION:   1.  Respiratory distress after procedure.  2.  Medical management.   HISTORY OF PRESENT ILLNESS:  This is a very nice 81 year old female, who has a history of severe peripheral vascular disease with claudication, asthma and COPD, atherosclerosis, hypertension, hypercholesterolemia, and neurosensorial hearing loss.   The patient has been having trouble with her lower extremities with worsening claudication. She has had previous angioplasties of the left mid to distal SFA and proximal popliteal back in February 2014. She had worsening of the symptoms since that procedure. Apparently, she was not able to walk more than 100 to 200 feet without being in agony due to the severe claudication she was having.   The patient states that the pain was severe, 10 out of 10, and relieved with pain meds   The patient was taking Aggrenox, and then Dr. Lucky Cowboy, who is her vascular doctor, decided to bring her in, as she had significant results on her ankle-brachial index on the left side of 0.54. She had an arterial duplex ultrasound that showed a stenosis of 50% on the deep femoral artery on the left side.   The patient was admitted for day surgery on 08/21/2012 for a percutaneous transluminal angioplasty of left above the knee popliteal artery and superficial femoral artery with balloon angioplasty and stent placement of above the knee popliteal artery and superficial femoral.   The patient went home, and she was having severe pain. She could not sleep overnight despite the fact that she was taking Percocet. She states that her leg was going numb on and off, and this morning, she called the office of Dr. Lucky Cowboy, for which they saw her and did  an ultrasound. The ultrasound showed a thrombosis of the stent. The patient was brought in into the hospital. Dr. Delana Meyer saw the patient and took her to the OR. In the OR, he did localized TPA into the clot, and he was able to remove some of it, but during the procedure there was a complication. The patient stopped breathing, became hypoxic, and they had to stop.   The patient was transferred to the intensive care unit. During my interview, the patient is already awake and back to her normal status/baseline. She spiked a fever of 102, and she continues to be tachycardic and continues to have significant pain.   The plan of Dr. Delana Meyer was to keep her on the critical care unit and do an Integrilin drip, as he is not able to do more TPA on her.   I was asked to evaluate the patient and help with the medical management, especially for her respiratory distress.   As it is right now, the patient is awake, alert, oriented x3, and has just mild shortness of breath with wheezing.   REVIEW OF SYSTEMS:  A 12 system review of systems is done.  GENERAL:  The patient denies any significant weight gain, weight loss. The patient denies any fever or chills prior to procedure. She had a fever of 102 whenever she arrived to the CCU.  SKIN:  No rashes or ulcers. She had some changes of coloration on the left lower extremity, apparently became purple at some point during the evaluation, but now has recovered some of  her color. She has some discoloration, color pink, on the skin with decreased amount of hair with very thin skin.   HENT:  No difficulty swallowing. No sinus pain.  EYES:  No blurry vision, double vision, or erythema.  NECK:  No neck pain or neck swelling.  RESPIRATORY:  Positive occasional wheezing. Positive occasional difficulty breathing with chronic cough.  CARDIOVASCULAR:  Positive for leg pain and swelling. Denies any palpitations, chest pain, arrhythmias, syncopal episodes, or paroxysmal nocturnal  dyspnea.  GASTROINTESTINAL:  No constipation, diarrhea, nausea, vomiting. No abdominal pain.  GENITOURINARY:  No hematuria, dysuria, or changes on urination.  GYNECOLOGIC:  No breast masses or vaginal discharge.  MUSCULOSKELETAL:  Positive claudication. No muscle weakness. No swollen joints.  NEUROLOGIC:  No strokes. No CVAs. No numbness or dizziness.  PSYCHIATRIC:  No significant memory loss, depression, anxiety.  ENDOCRINOLOGY:  No polyuria, polydipsia, polyphagia, cold or heat intolerance.  HEMATOLOGIC AND LYMPHATIC:  Positive easy bruising due to Aggrenox. Denies any previous blood clots or significant anemia.   PAST MEDICAL HISTORY:  1.  Peripheral vascular disease.  2.  COPD.  3.  Atherosclerosis.  4.  Hypertension.  5.  Hypercholesterolemia.  6.  Allergic asthma or extrinsic asthma.  7.  Left lower extremity pain.   ALLERGIES:  The patient is allergic to CODEINE.   SOCIAL HISTORY:  The patient denies any alcohol use. She is a former smoker. She said she has not smoked in over 30 years. She lives with her husband. She is retired.   MEDICATIONS:  Prednisone, cefuroxime already stopped, doxycycline already stopped, hydrocortisone cream as needed, Nexium 40 mg daily, Nucynta 50 mg daily, Topamax 50 mg twice daily, Singulair 10 mg once daily, Simvastatin 10 mg daily, Reclast 5 mg once a year for osteoporosis, Pulmicort 180 mg twice daily, ProAir inhaler as needed, pramipexole 0.25 mg at bedtime, Nasonex 50 mcg inhaled, multivitamins once daily, Klor-Con 20 mg once a day, hydrochlorothiazide 25 mg daily, glucosamine as needed, diltiazem 180 mg once a day, calcium plus vitamin D once daily, Aggrenox 25/200 twice daily.   FAMILY HISTORY:  Positive for MI in her father, COPD in her mother, colon cancer in her grandfather. Her mother also had diabetes and poor circulation with peripheral vascular disease, and she did have a stroke and had severe hypertension.   PAST SURGICAL HISTORY:   Positive for cochlear implants bilaterally in 2009, appendectomy in 1946, cataract surgery in 2008, hysterectomy in 1960, and angioplasty in February 2006, February  2010, and 2012 on the left side, and on the right side, in 2007, 2008, and 2010.   PHYSICAL EXAMINATION:  VITAL SIGNS:  Blood pressure 181/70, pulse 106, temperature 102, oxygen saturation is 95% on 2 L nasal cannula; it was down to 92% on room air.  GENERAL:  Alert, oriented x3. No acute distress. No respiratory distress. Hemodynamically stable.  HEENT:  Pupils are equal and reactive. Extraocular movements are intact. Mucosa are moist. Anicteric sclerae. Pink conjunctivae. No oral lesions. No oropharyngeal exudates.  NECK:  Supple. No JVD. No thyromegaly. No adenopathy. No carotid bruits.  CARDIOVASCULAR:  Regular rate and rhythm, tachycardic. No murmurs, rubs, or gallops. No displacement of PMI. No tenderness to palpation, anterior chest wall.  LUNGS:  Showing some decreased respiratory sounds in both bases, right more than left. There is some wheezing diffuse on both respiratory sounds. No use of accessory muscles at this moment. No dullness to percussion.  ABDOMEN:  Soft, nontender, nondistended. No hepatosplenomegaly. No masses.  Bowel sounds are positive.  GENITAL:  Exam negative for external lesions. Foley catheter in place.  EXTREMITIES:  No edema, cyanosis, or clubbing. Pulses +1 in both extremities. Capillary refill less than 3 seconds on the right, more than 4 to 5 seconds on the left. Sensation is decreased in the left side mildly, but the patient states that it has improved significantly.  NEUROLOGIC:  Cranial nerves II through VII intact. Strength is 5/5 in upper extremities bilaterally and also in lower extremities, although it is difficult to evaluate due to pain on the left lower extremity, but seems to be equally strong.  PSYCHIATRIC:  Mood is normal without any signs of depression or anxiety.  MUSCULOSKELETAL:  No  significant joint effusions or exudates. No edema of joints.  LYMPHATIC:  Negative for lymphadenopathy of the neck or supraclavicular areas.  SKIN:  No rashes or petechiae. There is decreased hair on both lower extremities with discoloration at the level of the pretibial areas, a pink color. Mottled skin bilateral, more evident on the left side.   RESULTS:   1.  Tachycardic, normal sinus rhythm on EKG monitor.  2.  Glucose 115, BUN 22, potassium 3.4. White count 10, hemoglobin 11, platelet count 241.  3.  Chest x-ray ordered stat shows some fullness at the level of the left peribronchial area and increased size of the right ventricle.  4.  No evidence of infiltrates, discoid atelectasis bilaterally, no signs of CHF exacerbation.   ASSESSMENT AND PLAN:   1.  Peripheral vascular disease status post stent placement and angioplasty complicated with thrombosis of the stent. The patient received small doses of TPA localized on the clot, and now she is on Integrilin drip.  2.  The patient seems to be regaining some more circulation. Pulses are palpable bilaterally at this moment, more evident on the right than on the left, but are still palpable. 3.  Plan to possibly do an endarterectomy and clot removal, likely tomorrow.  4.  Respiratory distress, acute respiratory failure. The patient stopped breathing during the procedure. This is likely secondary to either chronic obstructive pulmonary disease/asthma versus the use of Versed. At this moment, the patient is back to her normal self, although she is still wheezing. We are going to start her on steroids, start her on schedule nebulizers. Since the patient is tachycardic, we are going to choose Xopenex.  5.  Incentive spirometry andflutter valve are going to be put on as well.  6.  The patient had a chest x-ray that did not show any signs of aspiration. The patient had a temperature of 102 that could be secondary to atelectasis. We are going to keep  watching. If the temperature persists or if there is any elevation of white blood cells tomorrow, we could start covering with antibiotics, likely for aspiration, although at this moment, does not seem to be the case.  7.  We are going to keep our eyes open for any more symptoms of respiratory distress. Continue Pulmicort, continue Singulair, as the patient had a very significant extrinsic mechanism of her asthma/chronic obstructive pulmonary disease.  8.  The patient at this moment is stable on 2 L nasal cannula. No significant respiratory distress.  9.  Hypertension. The patient has not had her medications. We are going to start her on Cardizem 180 mg a day, as she takes that at home. She did not take it today. She is also tachycardic. This is going to help. We are going to avoid  albuterol and give her some Xopenex.  10.  Hypokalemia. The patient has scheduled potassium at home that she has not taken. We are going to give it to her now.  11.  Other medical problems seem to be stable. The patient is a FULL CODE.  12.  Gastrointestinal prophylaxis with proton pump inhibitor. The patient is a patient of Dr. Sabra Heck. We are going to transfer her care for Dr. Sabra Heck in the morning.   I spent about 55 minutes reviewing the case, talking with the patient and nursing and attending physician.     ____________________________ [place] Sink, MD rsg:ms D: 08/22/2012 20:56:09 ET T: 08/22/2012 21:23:48 ET JOB#: [id number]  cc: Hermleigh Sink, MD, <Dictator> Keylor Rands America Brown MD ELECTRONICALLY SIGNED 08/26/2012 15:32

## 2014-05-03 NOTE — Op Note (Signed)
PATIENT NAME:  Natasha, [name] MR#:  [id number] DATE OF BIRTH:  28-Jun-1928  DATE OF PROCEDURE:  08/23/2012  PREOPERATIVE DIAGNOSES:  1.  Atherosclerotic occlusive disease, bilateral lower extremities.  2.  Complication, vascular device, with thrombosis of Viabahn stent.  3.  Rest pain and ischemia of left lower limb.   POSTOPERATIVE DIAGNOSES:  1.  Atherosclerotic occlusive disease, bilateral lower extremities.  2.  Complication, vascular device, with thrombosis of Viabahn stent.  3.  Rest pain and ischemia of left lower limb.   PROCEDURE:  1.  Left lower extremity distal runoff, third order catheter placement.  2.  Infusion of lytic agent, 8 mg of t-PA.  3.  Mechanical thrombectomy of left superficial femoral artery and previously placed stents, using the AngioJet.   SURGEON: Katha Cabal, MD  SEDATION: Versed 3 mg.   FLUOROSCOPY TIME: Seven minutes.   CONTRAST USED: Isovue 35 mL.   INDICATIONS: [name] is an 81 year old woman who presented to the office earlier in the day with increasing pain in her lower extremity. On the day prior, she had undergone  intervention with angioplasty and stent placement utilizing Viabahn stents. The risks and benefits for reintervention and possible lytic therapy were reviewed. All questions have been answered and the patient has asked that we proceed with every attempt to save her leg. Implications of t-PA infusion were also discussed.   DESCRIPTION OF PROCEDURE: The patient is taken to special procedures and placed in the supine position after adequate sedation with just 2 mg of Versed was obtained, as she had eaten lunch and therefore is not a candidate for conscious sedation.   Access to the (Dictation Anomaly)<<MISSING TEXT>> common femoral artery was obtained after the right groin was prepped and draped in sterile fashion. Ultrasound was utilized. It was placed in a sterile sleeve. This was to avoid injury to the vasculature and  given the patient's hematoma from the prior procedure secondary to lack of appropriate landmarks.   Common femoral vein was noted to be pulsatile and echolucent, indicating patency. Images  recorded for the permanent record, and under real-time visualization after 1% lidocaine with epinephrine was infiltrated in the soft tissues, access was obtained to the common femoral artery with a micropuncture needle, microwire followed by micro sheath, J-wire followed by 5-French sheath and 5-French pigtail catheter.   The pigtail catheter was positioned just above the bifurcation and an RAO projection of the pelvis was obtained. A stiff angled Glidewire and pigtail catheter were negotiated down to the external iliac and an LAO projection of the left groin was obtained. Essentially flush occlusion of the superficial femoral artery was identified. The profunda was widely patent. Heparin 3000 units was given. A stiff angled Glidewire was reintroduced down into the profunda, and subsequently a 6-French Ansell was advanced up and over the bifurcation and exchanged for the 5-French sheath. Using a straight catheter and the stiff Glidewire, the superficial femoral artery was engaged and then the catheter wire combination advanced down through the SFA through the previously placed stents and into the distal popliteal. Here, hand injection of contrast demonstrated that the anterior tibial runoff had been preserved and was widely patent. The posterior (Dictation Anomaly)<<MISSING TEXT>> tibial was occluded but this was pre-existing and the peroneal demonstrated some clot in its distal two thirds.   The AngioJet was then prepped on the field. Through the catheter, a Magic torque wire was advanced and the catheter removed. Then 8 mg of t-PA was then reconstituted  in 50 mL, and this was laced throughout the length of the SFA and popliteal. Dwell time was then allowed for 30 minutes and subsequently the AngioJet was reintroduced and  in the aspiration mode used to perform suction thrombectomy of the SFA from the origin down to the distal popliteal.   A total of 300 was used. Follow-up angiography demonstrated that there was still absence of flow throughout the entire SFA.   At this point, the patient has respiratory issues. She was noted to have sats that had dropped rather abruptly into the low 70s and she had become unresponsive. It was therefore elected to terminate the procedure. Integrilin bolus was given and subsequently the Integrilin drip was started in hopes of continued autolysis of the clot as the clot was quite fresh. The sheath was then pulled back into the right external iliac, oblique view was obtained, and a StarClose device was deployed without difficulty with good success.   The patient was resuscitated with a small fluid bolus, increased oxygenation, a nasal airway was placed, and a respiratory treatment was ordered STAT as she does have a significant case of asthma. Given these interventions, she did become arousable and returned almost immediately to her baseline. She also was noted to have sats now in the high 90s, 97% to 98% on a 60% Ventimask.   Over the course of the next hour, she was weaned down to the nasal cannula. She will be taken to the intensive care unit and continued on her Integrilin overnight. If her foot is not improved by morning, preparations will be made for fem below-knee pop bypass provided her overall medical condition stabilizes and indeed improves.   INTERPRETATION: Initial views demonstrate the distal aorta and the iliacs were widely patent. The common femoral and profunda femoris are widely patent. Superficial femoral artery is now thrombosed from its origin down to the mid distal popliteal. Tibial runoff is also slightly changed with thrombus in the distal and mid peroneal. The anterior tibial is the dominant runoff and widely patent to the foot.     ____________________________ Katha Cabal, MD ggs:np D: 08/23/2012 15:11:21 ET T: 08/23/2012 21:35:37 ET JOB#: [id number]  cc: Katha Cabal, MD, <Dictator> Rusty Aus, MD

## 2014-05-03 NOTE — Discharge Summary (Signed)
PATIENT NAME:  Natasha [name], Natasha [name] MR#:  [id number] DATE OF BIRTH:  06/06/1928  DATE OF ADMISSION:  08/22/2012 DATE OF DISCHARGE:  08/26/2012  ADMITTING DIAGNOSES:  1.  Atherosclerotic occlusive disease of bilateral lower extremities. 2.  Complication vascular device with thrombosis of Viabahn stent. 3.  Rest pain and ischemia of the left lower limb.   DISCHARGE DIAGNOSES:  1.  Atherosclerotic occlusive disease of bilateral lower extremities. 2.  Complication vascular device with thrombosis of Viabahn stent. 3.  Rest pain and ischemia of the left lower limb.   HOSPITAL PROCEDURES:  1.  On 08/22/2012, Dr. Delana Meyer performed left lower extremity distal runoff with infusion of TPA and mechanical thrombectomy of left SFA and previously placed stents using the AngioJet. 2.  On 08/25/2012, Dr. Delana Meyer performed left leg angiogram with angioplasty with stent placement of the left SFA.   CONSULTANT: Dr. James Ivanoff for respiratory distress after procedure and medical management.   BRIEF HISTORY: The patient is an 81 year old white female who presented to our office one day after her left leg angiogram with excruciating pain. Noninvasive study showed occlusion of the left SFA and above-knee popliteal arteries and stent. She was then sent to the hospital for thrombolysis.   HOSPITAL COURSE: On 08/22/2012, Dr. Delana Meyer performed the above procedure. During the procedure, she began having respiratory issues with her sats dropping into the low 70s and becoming unresponsive. The procedure was then terminated, and she was given Integrilin to continue autolysis of the clot. Respiratory came and treated with increased oxygenation and nasal airway placed. She then became arousable and returned almost immediately to her baseline with sats in the high 90s on 60% Ventimask. Over the next hour, she was weaned down to nasal cannula. She was then taken to the intensive care unit and continued on Integrilin  overnight. Her breathing continued to improve, and she did well from that standpoint. Medical management was on board as well. Her left leg pain significantly improved. She was placed on argatroban drip for avoiding using heparin until results were obtained of heparin antibodies since she occluded the stent so quickly. She was closely monitored in the intensive care unit. Her heparin antibody results came back in the normal range. On 08/15, she returned to special procedures for an angiogram of the left leg. The following day, she was doing well and ready for discharge.   DISCHARGE INSTRUCTIONS: See medication list for all her medications. She was sent home on Aggrenox. Plan to remove dressing in 2 to 3 days and may shower. No exertional activity.  Regular diet. We will see her back in 3 to 4 weeks at [facility] with ABIs. She was instructed to call or return with any issues in the interim. For more information, please see the patient's chart.   ____________________________ Marin Shutter. Chantalle Defilippo, PA-C cnh:cb D: 08/29/2012 14:50:01 ET T: 08/29/2012 20:22:49 ET JOB#: [id number]  cc: Marin Shutter. Mariadelaluz Guggenheim, PA-C, <Dictator> [place] ELECTRONICALLY SIGNED 09/05/2012 10:35

## 2014-05-03 NOTE — Op Note (Signed)
PATIENT NAME:  RAYVEN, SENNOTT MR#:  [id number] DATE OF BIRTH:  02-05-1928  DATE OF PROCEDURE:  08/25/2012  PREOPERATIVE DIAGNOSES: 1.  Ischemia, left lower extremity.  2.  Complication of vascular device.  3.  Atherosclerotic occlusive disease, bilateral lower extremities with rest pain of the left lower extremity.   POSTOPERATIVE DIAGNOSES: 1.  Ischemia, left lower extremity.  2.  Complication of vascular device.  3.  Atherosclerotic occlusive disease, bilateral lower extremities with rest pain of the left lower extremity.  PROCEDURE PERFORMED:  1.  Left lower extremity angiography, third order catheter placement.  2.  Percutaneous transluminal angioplasty with stent placement, left superficial femoral artery.   SURGEON:  Hortencia Pilar, M.D.   SEDATION:  Versed 3 mg plus fentanyl 100 mcg administered IV.  Continuous ECG, pulse oximetry and cardiopulmonary monitoring is performed throughout the entire procedure by the interventional radiology nurse.  Total sedation time was 45 minutes.   ACCESS:  6 French sheath, right common femoral artery.   FLUOROSCOPY TIME:  3.5 minutes.   CONTRAST USED:  Isovue 50 mL.   INDICATIONS:  Mrs. Dalley is an 81 year old woman who presented to the hospital with thrombosis of previously placed stents.  She subsequently was taken to angio where attempts at thrombectomy were performed.  Unfortunately, she developed respiratory difficulties and was taken to the Intensive Care Unit prior to having a completion angiogram showing patency.  She was initiated on Integrilin and over the course of the next 12 hours her foot became reperfused and palpable dorsalis pedis pulse was noted.  She was therefore initiated on argatroban for concerns regarding heparin-induced antibody since she had had the heparin-bonded Viabahn placed.  She is now being returned to the angio suite after antibody tests have been negative as there is concern regarding whether there is a  residual lesion which prompted the thrombosis.  The risks and benefits were reviewed with the patient and her husband.  All questions had been answered.  The patient agrees to proceed.   DESCRIPTION OF PROCEDURE:  The patient is taken to special procedures and placed in the supine position.  After adequate sedation is achieved, both groins are prepped and draped in a sterile fashion.  Ultrasound is placed in a sterile sleeve.  Ultrasound is utilized secondary to lack of appropriate landmarks and to avoid vascular injury.  Under direct ultrasound visualization, the common femoral artery is identified.  It is echolucent and homogeneous indicating patency.  Image is recorded for the permanent record.  A micropuncture needle is used to access.  Under direct visualization, microwire followed by micro sheath, J-wire followed by 5 French sheath.  Rim catheter and J-wire were then advanced up and over the bifurcation.  Catheter is positioned in the common femoral and an LAO projection of the groin is obtained.  A high-grade lesion at the proximal portion of the SFA was then identified.  Distal runoff then showed the previously stented segment is widely patent as is the distal popliteal and there is now two vessel runoff via peroneal and anterior tibial.   A Magic torque wire is then advanced through the rim catheter down into the distal SFA popliteal.  A 6 [place] Raby is advanced up and over the bifurcation, positioned with its tip in the mid common femoral.  2000 units of heparin is given as the argatroban has only been turned off for 30 minutes.   A 5 x 8 balloon is then advanced over the wire and this area  is angioplastied to 14 atmospheres for two full minutes.  Follow-up angiography demonstrates that there is significant dissection noted.  The ostium still has some residual stenosis, but I measure this at approximately 20% maximal, right at the ostium.  The remaining portion of the proximal SFA is widely patent.    A 6 x 60 stent is then advanced across the area of dissection deployed.  It is post dilated with a 5 balloon and follow-up angiography demonstrates there is an excellent result and no residual stenosis at the stented segment.  The distal edge of the previously placed stents down near the popliteal is also interrogated in magnified views and this demonstrates approximately 15% to 20% residual stenosis.  It is smooth and does not appear to be flow limiting.   The sheath is then pulled into the right external, oblique view of the right groin is obtained and subsequently a Mynx device is deployed.  There were no immediate complications.   INTERPRETATION:  The left common external and internal iliac arteries are widely patent.  The common femoral and profunda femoris on the left is widely patent.  Superficial femoral artery demonstrates significant disease in its proximal one-third including the ostia.  Previously placed stents are widely patent.  There is patency of the distal popliteal and two vessel runoff.   Following angioplasty as described above, there is significant residual dissection and therefore a stent is deployed and posted with the same 5 balloon.  This resolves the stenosis.   SUMMARY:  Successful intervention of the SFA with angioplasty and stent placement.    ____________________________ Katha Cabal, MD ggs:ea D: 08/25/2012 18:55:20 ET T: 08/26/2012 02:31:35 ET JOB#: [id number]  cc: Katha Cabal, MD, <Dictator> Katha Cabal, MD Rusty Aus, MD Katha Cabal MD ELECTRONICALLY SIGNED 08/30/2012 7:32

## 2014-05-03 NOTE — Op Note (Signed)
PATIENT NAME:  Natasha [name], Natasha [name] MR#:  [id number] DATE OF BIRTH:  [birth year]  DATE OF PROCEDURE:  08/22/2012  PREOPERATIVE DIAGNOSES:  1.  Atherosclerotic occlusive disease, bilateral lower extremities.  2.  Complication, vascular device, with thrombosis of Viabahn stent.  3.  Rest pain and ischemia of left lower limb.   POSTOPERATIVE DIAGNOSES:  1.  Atherosclerotic occlusive disease, bilateral lower extremities.  2.  Complication, vascular device, with thrombosis of Viabahn stent.  3.  Rest pain and ischemia of left lower limb.   PROCEDURE:  1.  Left lower extremity distal runoff, third order catheter placement.  2.  Infusion of lytic agent, 8 mg of t-PA.  3.  Mechanical thrombectomy of left superficial femoral artery and previously placed stents, using the AngioJet.   SURGEON: Katha Cabal, MD  SEDATION: Versed 3 mg.   FLUOROSCOPY TIME: Seven minutes.   CONTRAST USED: Isovue 35 mL.   INDICATIONS: Natasha [name] is an 81 year old woman who presented to the office earlier in the day with increasing pain in her lower extremity. On the day prior, she had undergone  intervention with angioplasty and stent placement utilizing Viabahn stents. The risks and benefits for reintervention and possible lytic therapy were reviewed. All questions have been answered and the patient has asked that we proceed with every attempt to save her leg. Implications of t-PA infusion were also discussed.   DESCRIPTION OF PROCEDURE: The patient is taken to special procedures and placed in the supine position after adequate sedation with just 2 mg of Versed was obtained, as she had eaten lunch and therefore is not a candidate for conscious sedation.   Access to the right common femoral artery was obtained after the right groin was prepped and draped in sterile fashion. Ultrasound was utilized. It was placed in a sterile sleeve. This was to avoid injury to the vasculature and given the patient's hematoma from  the prior procedure secondary to lack of appropriate landmarks.   Common femoral vein was noted to be pulsatile and echolucent, indicating patency. Images  recorded for the permanent record, and under real-time visualization after 1% lidocaine with epinephrine was infiltrated in the soft tissues, access was obtained to the common femoral artery with a micropuncture needle, microwire followed by micro sheath, J-wire followed by 5-French sheath and 5-French pigtail catheter.   The pigtail catheter was positioned just above the bifurcation and an RAO projection of the pelvis was obtained. A stiff angled Glidewire and pigtail catheter were negotiated down to the external iliac and an LAO projection of the left groin was obtained. Essentially flush occlusion of the superficial femoral artery was identified. The profunda was widely patent. Heparin 3000 units was given. A stiff angled Glidewire was reintroduced down into the profunda, and subsequently a 6-French Ansell was advanced up and over the bifurcation and exchanged for the 5-French sheath. Using a straight catheter and the stiff Glidewire, the superficial femoral artery was engaged and then the catheter wire combination advanced down through the SFA through the previously placed stents and into the distal popliteal. Here, hand injection of contrast demonstrated that the anterior tibial runoff had been preserved and was widely patent. The posterior tibial was occluded but this was pre-existing and the peroneal demonstrated some clot in its distal two thirds.   The AngioJet was then prepped on the field. Through the catheter, a Magic torque wire was advanced and the catheter removed. Then 8 mg of t-PA was then reconstituted in 50 mL, and this  was laced throughout the length of the SFA and popliteal. Dwell time was then allowed for 30 minutes and subsequently the AngioJet was reintroduced and in the aspiration mode used to perform suction thrombectomy of the SFA  from the origin down to the distal popliteal.   A total of 300 was used. Follow-up angiography demonstrated that there was still absence of flow throughout the entire SFA.   At this point, the patient has respiratory issues. She was noted to have sats that had dropped rather abruptly into the low 70s and she had become unresponsive. It was therefore elected to terminate the procedure. Integrilin bolus was given and subsequently the Integrilin drip was started in hopes of continued autolysis of the clot as the clot was quite fresh. The sheath was then pulled back into the right external iliac, oblique view was obtained, and a StarClose device was deployed without difficulty with good success.   The patient was resuscitated with a small fluid bolus, increased oxygenation, a nasal airway was placed, and a respiratory treatment was ordered STAT as she does have a significant case of asthma. Given these interventions, she did become arousable and returned almost immediately to her baseline. She also was noted to have sats now in the high 90s, 97% to 98% on a 60% Ventimask.   Over the course of the next hour, she was weaned down to the nasal cannula. She will be taken to the intensive care unit and continued on her Integrilin overnight. If her foot is not improved by morning, preparations will be made for fem below-knee pop bypass provided her overall medical condition stabilizes and indeed improves.   INTERPRETATION: Initial views demonstrate the distal aorta and the iliacs were widely patent. The common femoral and profunda femoris are widely patent. Superficial femoral artery is now thrombosed from its origin down to the mid distal popliteal. Tibial runoff is also slightly changed with thrombus in the distal and mid peroneal. The anterior tibial is the dominant runoff and widely patent to the foot.    ____________________________ Katha Cabal, MD ggs:np D: 08/23/2012 15:11:00 ET T: 08/23/2012  21:35:37 ET JOB#: [id number]  cc: Rusty Aus, MD Katha Cabal, MD, <Dictator>    Katha Cabal MD ELECTRONICALLY SIGNED 08/30/2012 7:32

## 2014-05-03 NOTE — Op Note (Signed)
PATIENT NAME:  SABIRAH, PIPPERT MR#:  [id number] DATE OF BIRTH:  11/07/1928  DATE OF PROCEDURE:  11/01/2012  PREOPERATIVE DIAGNOSES: 1.  Ischemia, left lower extremity.  2.  Atherosclerotic occlusive disease, bilateral lower extremities, with rest pain of left lower extremity.  3.  Complication of vascular device with thrombosis of left superficial femoral artery stent.   POSTOPERATIVE DIAGNOSES: 1.  Ischemia, left lower extremity.  2.  Atherosclerotic occlusive disease, bilateral lower extremities, with rest pain of left lower extremity.  3.  Complication of vascular device with thrombosis of left superficial femoral artery stent.   PROCEDURES PERFORMED: 1.  Abdominal aortogram.  2.  Left lower extremity distal runoff, third order catheter placement.  3.  Mechanical thrombectomy, left SFA using the AngioJet.  4.  Percutaneous transluminal angioplasty of the left popliteal and SFA using 4 mm and 5 mm balloons.  5.  Initiation of continuous tPA infusion for lytic therapy.   SURGEON: Hortencia Pilar, M.D.   SEDATION:  Versed 7 mg plus fentanyl 350 mcg, administered IV. Continuous ECG, pulse oximetry and cardiopulmonary monitoring is performed throughout the entire procedure by the interventional radiology nurse. Total sedation time is 1 hour, 30 minutes.   ACCESS: A 6-French sheath, right common femoral artery.   FLUOROSCOPY TIME: 12.7 minutes.   CONTRAST USED: Isovue 90 mL.   INDICATIONS: Ms. Krout presented to the office with increasing pain in her left lower extremity. She had been taken off her Xarelto recently secondary to an injury with subsequent hematoma. Physical examination, as well as noninvasive studies, demonstrated thrombosis of the previously placed stents within the SFA. Risks and benefits for angiography and attempts at limb salvage were reviewed. All questions answered. The patient and husband voice understanding, and the patient agrees to proceed. Husband concurs.    DESCRIPTION OF PROCEDURE: The patient is taken to special procedures and placed in the supine position. After adequate sedation is achieved, both groins are prepped and draped in a sterile fashion; 1% lidocaine is infiltrated in the soft tissues and access to the common femoral artery is identified with ultrasound guidance. Ultrasound is placed in a sterile sleeve. Common femoral artery is identified. It is echolucent and pulsatile indicating patency. Image is recorded for the permanent record. Under direct ultrasound visualization, microneedle is inserted into the anterior wall; microwire followed by microsheath, J-wire followed by 5-French sheath and 5-French pigtail catheter. The pigtail catheter is positioned at the level of T12 and AP projection of the aorta is obtained. Pigtail catheter is repositioned to above the bifurcation and an RAO projection of the pelvis is obtained. A 260 stiff-angled Glidewire and pigtail catheter are then used to cross the aortic bifurcation, and the pigtail catheter is positioned in the distal external iliac and an LAO projection of the groin is obtained. Attempted distal runoffs are unsuccessful, as there is poor flow of contrast.   A stiff-angled Glidewire is then reintroduced and advanced down into the profunda femoris. Pigtail catheter is removed. The 5-French sheath is removed, and a 6-French Ansel sheath is advanced up and over the bifurcation and positioned with its tip in the mid common femoral. Using a combination of the Glidewire, a straight catheter and subsequently a Magic Torque wire, the SFA is engaged, and the occlusion is crossed. Straight catheter is advanced with the wire down to the mid popliteal at the level of the tibial plateau and distal hand injection is utilized to demonstrate the distal runoff. After review of these images, the wire  is then negotiated into the posterior tibial down to the level of the malleolus. The straight catheter is used to  follow the wire. This is the angled Glidewire. Hand injection of contrast verifies intraluminal placement within the posterior tibial with diffuse distal disease. The Magic Torque wire is then reintroduced at this level, and the catheter removed.   AngioJet is then opened and prepped on the field.  6 mg of tPA is reconstituted in 50 mL, and this is used to lace the entire SFA and proximal popliteal and allowed to dwell for 30 minutes. Followup angiography demonstrates persistent occlusion of the SFA, and the AngioJet is reintroduced and now aspiration mode is undertaken. Multiple passes are made to a total of 180 mL. Followup angiography now demonstrates there is flow; however, there are multiple areas of stricture stenoses.  AngioJet is removed, and first a 4 mm balloon is utilized, and subsequently a 5 mm balloon is utilized to angioplasty the SFA in multiple locations, as well as the proximal popliteal. Followup angiography demonstrates flow, but it is very sluggish with significant improvement throughout the SFA, as well as at the origin of the SFA; however, there remains a greater than 80% stenosis in the popliteal area. Three doses of nitroglycerin were given intra-arterially to a total of 750 mcg. Subsequently, a 5 x 6 balloon is reintroduced, and the popliteal lesion is re-angioplastied. The balloon easily comes to profile at 10 atmospheres, and it is left inflated for 1 minute. Followup angiography demonstrates thrombosis of the entire SFA again, and therefore with the wire still in position, an infusion catheter with a 50 cm infusion length is reintroduced. Wire is removed. The catheter is secured to the thigh. It is flushed with heparinized saline. The sheath is then secured with 0 silk, and a sterile dressing is applied.   The patient's right neck is then prepped and draped in a sterile fashion. Ultrasound is placed in a sterile sleeve. Jugular vein is identified. It is echolucent and compressible  indicating patency. Image is recorded for the permanent record, and a Seldinger needle is inserted into the jugular vein under direct ultrasound visualization after 1% lidocaine had been infiltrated. J-wire is then advanced. Dilator is advanced over the wire, and triple-lumen catheter is fed without difficulty. All 3 lumens aspirate and flush easily. The catheter is secured to the skin of the neck with 2-0 silk, and a sterile dressing is applied.   The patient is then taken to the intensive care unit, where tPA infusion will be initiated.   INTERPRETATION: The abdominal aorta and the iliac arteries are patent. The left common femoral and profunda femoris is patent. Superficial femoral artery is occluded at its origin, remains occluded to the mid popliteal at the level of the tibial plateau. Distally, there is diffuse disease. The dominant runoff is the anterior tibial. They are all quite small, measuring approximately 1 mm in diameter. Even under magnification, this measurement holds true. There is diffuse disease in all 3 tibials with subtotal occlusions throughout.   Following initial aspiration, there is recanalization and the uncovering of multiple lesions. Following angioplasty, there is rethrombosis and initiation of continuous tPA infusion is initiated.   SUMMARY: Critically ischemic leg secondary to thrombosis within the left superficial femoral and popliteal arteries with initiation of lytic therapy. The patient will be rechecked the morning of 11/02/2012.     ____________________________ Katha Cabal, MD ggs:dmm D: 11/02/2012 20:31:00 ET T: 11/02/2012 22:00:57 ET JOB#: [id number]  cc: Belenda Cruise  Eloise Levels, MD, <Dictator> Katha Cabal MD ELECTRONICALLY SIGNED 11/17/2012 8:14

## 2014-05-04 NOTE — Consult Note (Signed)
PATIENT NAME:  Natasha [name], Natasha [name] MR#:  [id number] DATE OF BIRTH:  07/25/1928  DATE OF CONSULTATION:  05/07/2013  REFERRING PHYSICIAN:  Dr. Leotis Pain. CONSULTING PHYSICIAN:  Leonie Douglas. Doy Hutching, MD  FAMILY PHYSICIAN:  Dr. Emily Filbert.  REASON FOR CONSULTATION:  Assist with medical management in a patient with multiple medical issues.   HISTORY OF PRESENT ILLNESS:  The patient is an 81 year old female with a history of multiple medical problems including benign hypertension, diverticulosis, reactive airways disease, osteoporosis and hypothyroidism. Presented to the Emergency Room with progressive lower extremity claudication requiring vascular surgery intervention. Thrombolysis was done this morning and she is scheduled for further vascular procedures in the morning. She is in the Intensive Care Unit at this time. She is lethargic from the anesthesia and sedation and is unable to answer questions. Her husband is in the room.   PAST MEDICAL HISTORY: 1.  Osteoarthritis.  2.  Benign hypertension.  3.  GE reflux disease. 4.  History of noncardiac chest pain.  5.  Diverticulosis.  6.  Reactive airways disease.  7.  Osteoporosis.  8.  Peripheral vascular disease, status post right PTCA and stent placed in the lower extremity.  9.  Large intrathoracic hiatal hernia.  10.  Status post thrombolysis and percutaneous angioplasty of the left superficial femoral artery.  11.  Status post appendectomy.  12.  Status post hysterectomy.   MEDICATIONS: 1.  Mirapex 0.25 mg p.o. b.i.d.  2.  Klor-Con 60 mEq p.o. daily.  3.  Flonase 2 puffs in each nostril daily.  4.  Triamterene/hydrochlorothiazide 37.5/25, 1 p.o. daily.  5.  Singulair 10 mg p.o. daily.  6.  Nexium 40 mg p.o. daily.  7.  ProAir 2 puffs t.i.d. as needed.  8.  Pulmicort 2 puffs b.i.d.  9.  Cardizem CD 180 mg p.o. daily.  10.  Oxygen at 2 liters per minute per nasal cannula at bedtime.  11.  Simvastatin 20 mg p.o. at bedtime.  12.   Topamax 50 mg p.o. b.i.d.  13.  Xarelto 20 mg p.o. daily.  14.  Pletal 100 mg p.o. b.i.d.   ALLERGIES: CODEINE, HYDROCODONE, LEVAQUIN, OXYCODONE AND TRAMADOL.   SOCIAL HISTORY: The patient is married. No history of alcohol or tobacco abuse.   FAMILY HISTORY: Positive for colon cancer and colon polyps.   REVIEW OF SYSTEMS:  Unable to obtain.   PHYSICAL EXAMINATION: GENERAL:  The patient is critically ill appearing, but in no acute distress.  VITAL SIGNS:  Currently remarkable for a blood pressure of 118/58 with a heart rate of 62, respiratory rate of 13, temperature of 97.3. Sats are 96% on 2 liters.  HEENT: Normocephalic, atraumatic. Pupils equally round and reactive to light and accommodation. Extraocular movements are intact. Sclerae anicteric. Conjunctivae clear.  Oropharynx is dry, but clear.  NECK: Supple without JVD. No adenopathy or thyromegaly is noted.  LUNGS: Reveal basilar rhonchi. No wheezes or rales. No dullness. Respiratory effort is normal.  CARDIAC: Regular rate and rhythm with normal S1 and S2. No significant rubs, murmurs or gallops. PMI is nondisplaced. Chest wall is nontender.  ABDOMEN: Soft, nontender with normoactive bowel sounds. No organomegaly or masses were appreciated. No hernias or bruits were noted.  EXTREMITIES: Without clubbing, cyanosis or edema. Pulses were trace bilaterally.  SKIN: Warm and dry without rash or lesions.  NEUROLOGIC: Cranial nerves II through XII grossly intact. Deep tendon reflexes were symmetric. Motor and sensory exam is nonfocal.  PSYCHIATRIC: Revealed a patient who was sedated  and unable to answer questions.   LABORATORY DATA: Stool for C. diff was negative. Her BUN was 16 with a creatinine of 0.76 and a GFR greater than 60.   ASSESSMENT: 1.  Peripheral vascular disease with lower extremity claudication.  2.  Reactive airways disease.  3.  Chronic respiratory failure, on oxygen.  4.  Osteoporosis.  5.  Benign hypertension.  6.   Hyperlipidemia.  7.  Hypothyroidism.   PLAN: Agree with current care. We will add IV Protonix for stress prophylaxis. Follow up routine labs and a portable chest x-ray in the morning. Proceed with vascular procedures as scheduled.   Thank you for the consultation. We will continue to follow this patient with you while in the hospital. Please call if questions arise.   Totals time spent on this consult was 45 minutes.    ____________________________ Leonie Douglas Doy Hutching, MD jds:dmm D: 05/07/2013 20:21:09 ET T: 05/07/2013 20:55:43 ET JOB#: [id number]  cc: Leonie Douglas. Doy Hutching, MD, <Dictator> Rusty Aus, MD JEFFREY D SPARKS MD ELECTRONICALLY SIGNED 05/09/2013 8:04

## 2014-05-04 NOTE — Discharge Summary (Signed)
PATIENT NAME:  Natasha [name], Natasha [name] MR#:  [id number] DATE OF BIRTH:  04-08-1928  DATE OF ADMISSION:  05/07/2013 DATE OF DISCHARGE:  05/14/2013    DIAGNOSES: 1.  Ischemia of left lower extremity.  2.  Atherosclerotic occlusive disease, bilateral lower extremities, with rest pain of left lower extremity.   PROCEDURES PERFORMED: 1.  Angiography with intervention and thrombolytic therapy using tPA, April 27 through May 08, 2013.  2.  Left femoral to below knee popliteal bypass grafting using a Gore Propaten graft, May 08, 2013.   CONSULTATIONS: [facility] for medical management.   HISTORY: Natasha [name] is an 81 year old woman who has undergone thrombolytic therapy for limb salvage on several occasions. She presented to the hospital acutely with pain in her left lower extremity. She had been off her Xarelto for several days following cholecystectomy. Risks and benefits for angiography with intervention and thrombolysis were reviewed with the patient at that time, and she agreed to proceed.   HOSPITAL COURSE: On the day of admission, she underwent angiography and was initiated on thrombolytic therapy. TPA was infused overnight while she was in the Intensive Care Unit. Central line was placed as well as Foley catheter and additional IV access. On April 28,  hospital day #2, followup angiography demonstrated minimal improvement. Further interventions were attempted, but ultimately she did not have a situation that would allow for long-term patency, and it was elected to proceed to the operating room for formal bypass surgery. On April 28, she underwent femoral endarterectomy of the common and profunda femoris arteries, as well as a popliteal endarterectomy extending down into the tibioperoneal trunk. Following these cleanout procedures, a Gore-Tex PTFE graft was then passed, and femoral to below the knee popliteal bypass was performed. At the completion of the surgery, she had a  palpable dorsalis pedis pulse, and her foot was pink and had brisk capillary refill. She was taken to the Intensive Care Unit.   In the immediate postoperative period, she did have 2 small bloody bowel movements, and her hemoglobin did decline to a low of 6.7. She did receive blood transfusions on hospital postoperative day #2 and subsequently incremented from 6.7 to 9.2. She has remained in the 9.0 level since the 2-unit transfusion.   She has been otherwise doing well over the weekend. She was restarted on her Xarelto and was ambulating with minimal assistance in the room. She is postoperative day #6 and felt fit for discharge.   She is discharged to skilled nursing at [facility]. She will follow up with Dr. Lucky Cowboy and I in 10 to 14 days for staple removal. She will continue her usual diet. Home medications are unchanged. She will continue Xarelto. She will continue Pletal. She is on oxycodone on a chronic basis. Percocet had been added for her acute postsurgical pain.    CONDITION ON DISCHARGE: Improved.    ____________________________ Katha Cabal, MD ggs:jcm D: 05/14/2013 12:37:25 ET T: 05/14/2013 12:52:09 ET JOB#: [id number]  cc: Katha Cabal, MD, <Dictator> Rusty Aus, MD Katha Cabal MD ELECTRONICALLY SIGNED 05/15/2013 10:06

## 2014-05-04 NOTE — Consult Note (Signed)
Chief Complaint:  Subjective/Chief Complaint Pt seen/examined. See dictation for details.   VITAL SIGNS/ANCILLARY NOTES: **Vital Signs.:   27-Apr-15 13:27  Temperature Temperature (F) 98    17:12  Pulse Pulse 62  Systolic BP Systolic BP 962  Diastolic BP (mmHg) Diastolic BP (mmHg) 69  Pulse Ox % Pulse Ox % 97    18:44  Temperature Temperature (F) 96  Pulse Pulse 72  Systolic BP Systolic BP 952  Diastolic BP (mmHg) Diastolic BP (mmHg) 58  Pulse Ox % Pulse Ox % 98    18:45  Temperature Temperature (F) 96  Pulse Pulse 72  Systolic BP Systolic BP 841  Diastolic BP (mmHg) Diastolic BP (mmHg) 58  Pulse Ox % Pulse Ox % 98  Oxygen Delivery 2L; Nasal Cannula    19:00  Pulse Pulse 62  Systolic BP Systolic BP 324  Diastolic BP (mmHg) Diastolic BP (mmHg) 58  Pulse Ox % Pulse Ox % 99  Oxygen Delivery 2L; Nasal Cannula    19:41  Temperature Temperature (F) 97.3  Pulse Pulse 60  Pulse Ox % Pulse Ox % 96  Oxygen Delivery 2L; Nasal Cannula   Brief Assessment:  GEN critically ill appearing   Cardiac Regular   Respiratory rhonchi   Gastrointestinal details normal Soft  Nontender  Bowel sounds normal   EXTR negative edema   Lab Results: Routine Micro:  27-Apr-15 15:47   Micro Text Report CLOSTRIDIUM DIFFICILE   PCR FOR TOXIGENIC C.DIFF  PCR FOR TOXIGENIC C.DIFFICILE : NEGATIVE   INTERPRETATION            Negative for C. difficile.    ANTIBIOTIC                       Routine Chem:  27-Apr-15 13:36   Creatinine (comp) 0.76  eGFR (Non-African American) >60 (eGFR values <38m/min/1.73 m2 may be an indication of chronic kidney disease (CKD). Calculated eGFR is useful in patients with stable renal function. The eGFR calculation will not be reliable in acutely ill patients when serum creatinine is changing rapidly. It is not useful in  patients on dialysis. The eGFR calculation may not be applicable to patients at the low and high extremes of body sizes, pregnant women, and  vegetarians.)  BUN 16 (Result(s) reported on 07 May 2013 at 01:59PM.)   Assessment/Plan:  Assessment/Plan:  Plan Agree with current care. Will add IV Protonix for stress prophylaxis. F/u labs and CXR in AM. Will follow with you. Call if questions arise.   Electronic Signatures: SIdelle Crouch(MD)  (Signed 27-Apr-15 20:16)  Authored: Chief Complaint, VITAL SIGNS/ANCILLARY NOTES, Brief Assessment, Lab Results, Assessment/Plan   Last Updated: 27-Apr-15 20:16 by SIdelle Crouch(MD)

## 2014-05-04 NOTE — Op Note (Signed)
PATIENT NAME:  Natasha [name], Natasha [name] MR#:  [id number] DATE OF BIRTH:  [birth year]  DATE OF PROCEDURE:  06/08/2013  PREOPERATIVE DIAGNOSES: 1.  Abscess, left groin.  2.  Atherosclerotic occlusive disease, bilateral lower extremities, status post left common femoral reconstruction with femoral to below knee prosthetic bypass.   POSTOPERATIVE DIAGNOSES: 1.  Abscess, left groin.  2.  Atherosclerotic occlusive disease, bilateral lower extremities, status post left common femoral reconstruction with femoral to below knee prosthetic bypass.   PROCEDURE PERFORMED: Incision and drainage of left groin abscess.   SURGEON: Katha Cabal, M.D.   ANESTHESIA: General by LMA.   FLUIDS: Per anesthesia record.   ESTIMATED BLOOD LOSS: Minimal.   SPECIMEN: Aerobic and anaerobic cultures to Microbiology.   INDICATIONS: Ms. Cancienne is an 81 year old woman who presents to the office with infection of the left groin. She is therefore undergoing incision and drainage of her abscess and exam under anesthesia with placement of a VAC dressing. Risks and benefits were reviewed. All questions answered. The patient agrees to proceed.   DESCRIPTION OF PROCEDURE: The patient is taken to the operating room and placed in the supine position. After adequate general anesthesia is induced and appropriate invasive monitors are placed, the left groin is prepped and draped in a sterile fashion and appropriate timeout is called.   Scalpel is then used to open the skin from the previous incision in the lower half. Upper half is necrotic and already open. Using curved Mayo scissors, the necrotic skin and subcutaneous fatty tissues are debrided. Old Vicryl suture is removed as it is encountered. It appears to be the superficial layers, and there is no exposure of the graft. The femoral sheath appears intact. The wound is then irrigated. Hemostasis is obtained with Bovie cautery and a VAC dressing is applied. The patient tolerated  the procedure well and there were no immediate complications.    ____________________________ Katha Cabal, MD ggs:dmm D: 06/08/2013 13:51:28 ET T: 06/08/2013 22:59:11 ET JOB#: [id number]  cc: Katha Cabal, MD, <Dictator> Katha Cabal MD ELECTRONICALLY SIGNED 06/12/2013 12:31

## 2014-05-04 NOTE — Op Note (Signed)
PATIENT NAME:  Natasha [name], Natasha [name] MR#:  [id number] DATE OF BIRTH:  08-03-1928  DATE OF PROCEDURE:  04/24/2013  PREOPERATIVE DIAGNOSIS: Chronic cholecystitis and cholelithiasis.   POSTOPERATIVE DIAGNOSIS: Chronic cholecystitis, cholelithiasis and possible common duct stones.   OPERATION PERFORMED: Laparoscopy, cholecystectomy and intraoperative cholangiogram.   SURGEON: S.G. Jamal Collin, M.D.   ANESTHESIA: General.   COMPLICATIONS: None.   ESTIMATED BLOOD LOSS: Less than 25 mL.   DRAINS: None.   DESCRIPTION OF PROCEDURE: The patient was put to sleep in the supine position on the operating table. The abdomen was prepped and draped out as a sterile field. Timeout procedure was completed. A small incision was made just below the umbilicus and a Veress needle with the InnerDyne sleeve was positioned in the peritoneal cavity and verified with the hanging drop method. Pneumoperitoneum was obtained and a 10 mm port was placed. With the camera in place, it was noted that there omental adhesion around the umbilical entry site. No bowel was involved. This precluded adequate entry towards the right upper quadrant. Accordingly, the camera was used to swing it around the adhesion and an epigastric 5 mm port was placed. With the 5 mm scope in place, the adhesions surrounding the umbilical port was freed and the camera was directed towards the right upper quadrant. Two lateral 5 mm ports were placed. The gallbladder was noted to be elongated and narrow and showed no acute findings. With careful traction cephalad, the Hartmann's pouch area was lifted up. The common duct was noted easily and noted to be mildly enlarged. The cystic duct was extremely narrow and was easily freed. The cystic artery was likewise freed. The cystic artery was hemoclipped and cut. A Kumar clamp and catheter were positioned. Cholangiogram was performed. There was good filling of the bile duct both proximal and distal, but it appears that there  was some filling defect towards the distal end of the common bile duct, but no obstruction to flow of the contrast to the bowel.   It was felt that this can be followed as an outpatient thereafter. The catheter was used to decompress the gallbladder and the cystic duct was then hemoclipped and cut. The gallbladder was then dissected free from its bed. A Harmonic scalpel was used in place of cautery in view of the patient having a cochlear implant. After the gallbladder was freed, a 5 mm port was placed and the  5 mm scope was used in the epigastric port site. The gallbladder was brought out through the umbilical port site. It was noted to contain a few small 5 mm stones. The fascial defect in the umbilicus was closed with 0 Vicryl placed with a suture passer and tied. The right upper quadrant was irrigated with some fluid and all fluid suctioned out. The clips were inspected and noted to be intact. The ports were then removed and pneumoperitoneum was released. The skin incision was closed with subcuticular 4-0 Vicryl, reinforced with Steri-Strips. A dry sterile dressing was placed. The patient tolerated the procedure well with no immediate problems encountered. She was extubated and returned to the recovery room in stable condition.   ____________________________ S.Robinette Haines, MD sgs:aw D: 04/25/2013 08:56:22 ET T: 04/25/2013 09:01:06 ET JOB#: [id number]  cc: Synthia Innocent. Jamal Collin, MD, <Dictator> [facility] Robinette Haines MD ELECTRONICALLY SIGNED 05/01/2013 11:29

## 2014-05-04 NOTE — Op Note (Signed)
PATIENT NAME:  Natasha [name], Natasha [name] MR#:  [id number] DATE OF BIRTH:  08-05-1928  DATE OF PROCEDURE:  05/08/2013  PREOPERATIVE DIAGNOSES: 1.  Ischemic left lower extremity.  2.  Thrombosis, left lower extremity arterial system with failure of lytic therapy.  3. Atherosclerotic occlusive disease, bilateral lower extremities, with a history of multiple interventions on the left.   POSTOPERATIVE DIAGNOSES: 1.  Ischemic left lower extremity.  2.  Thrombosis, left lower extremity arterial system with failure of lytic therapy.  3. Atherosclerotic occlusive disease, bilateral lower extremities, with a history of multiple interventions on the left.   PROCEDURES PERFORMED: 1.  Left femoral endarterectomy of the common and deep femoral artery with CorMatrix patch angioplasty.  2.  Repair of arterial defect with xenograft patch.  3.  Endarterectomy of the distal popliteal, anterior tibial and tibioperoneal trunk.  4.  Common femoral to tibioperoneal trunk bypass, using 6 mm Gore Propaten graft.  5. Open exposure, right common femoral artery, for removal of a 7-French sheath for hemostasis.    SURGEON:  Hortencia Pilar, M.D.   FIRST ASSISTANT:  Ms. Gillie Manners.   ANESTHESIA:  General by endotracheal intubation.   FLUIDS:  Per anesthesia record.   SPECIMEN:  1.  Plaque from the common and deep femoral arteries.  2.  Plaque from the popliteal and tibial vessels.    FLUIDS:  Per anesthesia record.   ESTIMATED BLOOD LOSS:  200 mL.   INDICATIONS:  [name] is an 81 year old woman who presented with an ischemic left lower extremity. She subsequently underwent initiation of tPA infusion upon return to special procedures this morning. Initial imaging demonstrated that the SFA throughout its entire course was still largely occluded and that there was profound tibial vessel disease. After some intervention, both the anterior tibial and posterior tibial were regained patency; however, common femoral  had developed thrombus and the superficial femoral and popliteal, although improved, were still significant for multiple areas of flow-limiting thrombus and/or stenosis. Because the femoropopliteal distribution was still inadequate and would not sustain patency, it was elected to bring her to the operating room for more definitive surgical repair, as the initial 14 hours of tPA did not seem to have achieved significant results. Risks and benefits were discussed in detail with her husband. All questions are answered. Both the patient, yesterday, expressed that everything be done to save her limb; her husband re-echoed that sentiment again today.   DESCRIPTION OF PROCEDURE: The patient is taken to the operating room and placed in the supine position. After adequate general anesthesia is induced and appropriate invasive monitors are placed, she is positioned supine and her left leg is prepped and draped in a circumferential fashion. Right groin is prepped and draped in a sterile fashion as well.   Appropriate timeout is called.   Vertical incision is then created in the right groin and the dissection carried down to expose the common femoral artery, which is dissected circumferentially, dissecting the profunda femoris as well for a distance of approximately 2 cm and then the superficial femoral artery for approximately 2 cm. All 3 were looped with Silastic vessel loops.  3000 units of heparin was given.   Longitudinal incision was made in the common femoral artery extending it distally. Upon entering the artery, acute thrombus was not encountered, as I did suppose; however, there were multiple fractured dissection planes, which were creating flow limitations. This appears to be a combination of atheromatous material as well as hyperplastic material. It extended into  the profunda femoris, as well as into the superficial femoral. Working through this incision, direct endarterectomy was performed of the common  femoral. The origin of the profunda femoris also had significant plaque with an obstructing lesion at the origin about the size of a popcorn kernel. This was treated with endarterectomy using both direct and eversion type technique. The superficial femoral was treated with eversion.   CorMatrix patch was then opened onto the field and rehydrated; 7-0 Prolene sutures were used to tack the intimal edge within the profunda femoris, a total of 3 sutures were used, and subsequently the CorMatrix patch was applied for repair of the arterial defect using running 6-0 Prolene. Flushing maneuvers were performed and flow was established, first to the profunda and then the SFA.   A 21-gauge butterfly was then inserted into the lateral wall of the common femoral and on-table angiography was performed. This demonstrated wide patency of the profunda with rapid filling. No evidence of intimal flap or filling defect in the origin; however, just below the level of the eversion endarterectomy of the SFA extending into a previously placed stent for a distance of approximately 6 to 8 mm was yet further dissection and flow-limiting stenosis. The more distal  stents actually were widely patent, but then at the knee level extending into the distal popliteal, again, dissection and hemodynamically significant occlusive material was present. At this time, given these findings, I elected to proceed with endarterectomy of the popliteal and the tibial vessels and extend the bypass down rather than attempt to pursue further intervention, which does not appear to have worked well.   The patient was then positioned in a slight frog-leg orientation, and a medial incision was made from the knee distally across the calf, fascia was opened, and the popliteal neurovascular bundle exposed. Dissection was then carried from the mid popliteal distally, exposing the proximal 1 cm of the anterior tibial, ligating the crossing vein with 2-0 silk suture  ligatures, and then extending down the tibioperoneal trunk and posterior tibial for a distance of approximately 2 cm, circumferentially looping all 3 of these structures. The atrium tunneler was then delivered onto the field and passed from the common femoral incision to the calf incision subsartorially and a 6 mm ringed Propaten graft passed, maintaining orientation with the racing stripe.   An additional 2000 units of heparin was given. The popliteal artery was opened. It was controlled with Silastic vessel loops, and the arteriotomy was extended down into the tibioperoneal trunk toward the posterior tibial. Endarterectomy was then performed of the popliteal, as well as the origin of the anterior tibial and the tibioperoneal trunk. Four 7-0 Prolene tacking sutures were used to individually tack the tibioperoneal trunk intimal flap, and four 7-0 Prolene sutures were used to tack the intimal edge within the anterior tibial.   The bypass was then fashioned beginning at the femoral.  After controlling the common femoral, profunda and SFA, an 11 blade was used to open the CorMatrix patch.  The Propaten graft was spatulated and an end graft to side common femoral artery anastomosis was fashioned with running CV-6 suture. Flushing maneuvers were performed and flow was established back through the profunda, as well as the SFA, for what that might be worth. The graft was flushed with heparinized saline and clamped just above the suture line. It was then tested for length, straightening the leg, and approximated to the popliteal tibioperoneal arteriotomy. It was then spatulated, and an end graft to side tibioperoneal/popliteal  anastomosis was fashioned with running CV-6 suture. Flushing maneuvers were performed and flow was established through the bypass to the distal limb.  Both anastomoses were inspected. Evicel sealed followed by Surgicel was placed after the wounds were irrigated with sterile saline, and  subsequently the femoral wound was closed in 5 layers using 2 layers of 2-0, 3 layers of 3-0 and staples. The popliteal incision was closed with multiple layers of 3-0 Vicryl, followed by staples.   At this time, vertical incision was made in the right groin overlying the sheath. The sheath was not removed downstairs, as oblique magnified views of the right groin demonstrated that the puncture was in the profunda femoris and not suitable for closure device. She is, therefore, undergoing open suturing of the common femoral to prevent hemorrhage. Vertical incision was then carried down following the shaft of the sheath down to the common femoral artery where a 5-0 Prolene was used to place a horizontal mattress suture around the sheath. The sheath was then pulled, and the suture secured with no evidence of further bleeding. This wound was then irrigated and closed in multiple layers using 2-0 Vicryl, followed by 3-0 Vicryl, followed by staples. It should be noted that the artery itself was not dissected circumferentially. It was only exposed along its anterior margin at the level of the sheath insertion.   Sterile dressings were applied. The patient tolerated the procedure well. There were no immediate complications. In the recovery area, she has a 2+ palpable dorsalis pedis pulse on the left and Doppler signals on the right, which is consistent with her preoperative exam.        ____________________________ Katha Cabal, MD ggs:dmm D: 05/08/2013 18:59:42 ET T: 05/08/2013 19:51:51 ET JOB#: [id number]  cc: Katha Cabal, MD, <Dictator> Rusty Aus, MD Katha Cabal MD ELECTRONICALLY SIGNED 05/15/2013 10:06

## 2014-05-04 NOTE — Op Note (Signed)
PATIENT NAME:  Natasha [name], Natasha [name] MR#:  [id number] DATE OF BIRTH:  05/08/1928  DATE OF PROCEDURE:  05/07/2013  PREOPERATIVE DIAGNOSES:  1. Peripheral arterial disease with rest pain, left lower extremity, with recurrent occlusion. Multiple previous interventions.  2. Recent surgery for gallbladder disease with cessation of anticoagulation.  3. Hypertension.  4. Chronic obstructive pulmonary disease.   POSTOPERATIVE DIAGNOSES:  1. Ultrasound guidance for vascular access to the right femoral artery.  2. Catheter placement into left peroneal artery from right femoral approach.  3. Left lower extremity angiogram.  4. Catheter-directed thrombolysis with 4 mg tPA to the left superficial femoral artery, popliteal artery, tibioperoneal trunk.  5. Mechanical rheolytic thrombectomy with the AngioJet catheter and same vessels.  6. Percutaneous transluminal angioplasty with left superficial femoral artery and popliteal artery with 5 mm diameter angioplasty balloon.  7. Placement of thrombolysis catheter for overnight thrombolysis.  8. Ultrasound guidance for vascular access, right jugular vein.  9. Placement of right jugular triple-lumen catheter.   SURGEON: Algernon Huxley, MD   ANESTHESIA: Local with moderate conscious sedation.   ESTIMATED BLOOD LOSS: 25 mL.   INDICATION FOR PROCEDURE: This is an 81 year old female who has severe peripheral vascular disease and has been on chronic anticoagulation. Her anticoagulation was stopped to have a cholecystectomy about 2 weeks ago and she over the last couple of days developed pain in her foot at rest with numbness in her foot and noninvasive study showed a recurrent occlusion of her multiple left lower extremity interventions. Risks and benefits were discussed for attempt at limb salvage. Informed consent was obtained.   DESCRIPTION OF PROCEDURE: The patient was brought to the vascular suite. Groins were shaved and prepped and a sterile surgical field was  created. Ultrasound was used to visualize a patent femoral artery.  The femoral artery was then accessed to the right under direct ultrasound guidance without difficulty with a Seldinger needle. A J-wire and 5-French sheath were then placed.  RIM catheter was used to cross the aortic bifurcation and advance to the left femoral head. Selective left lower extremity aniography was then performed.  This showed an occlusion about 1 cm beyond the origin of the superficial femoral artery. The stents were all occluded down to the popliteal artery. She reconstituted a popliteal artery and appeared to have a 2-vessel runoff distally. Although her imaging was reasonably poor, she had continuous motion artifact. The patient was given intravenous heparin. A 6-French Ansell sheath was placed over a Terumo Advantage wire. I was able to gain access to the superficial femoral artery without difficulty with the advantage wire and a Kumpe catheter and confirm intraluminal flow in the popliteal artery below the knee. I then replaced the wire. Four milligrams of tPA was delivered with the AngioJet catheter from the origin of the left SFA throughout the entire left SFA, popliteal artery, and down to the tibioperoneal trunk.  This was allowed to dwell for approximately 15 minutes. Mechanical rheolytic thrombectomy was then performed for about 150 mL of effluent returned through the same vessels from the SFA, popliteal, and tibioperoneal trunk. After this, there was still residual occlusion and thrombosis, although there was some improvement in flow that could be demonstrated with only partial thrombosis of her SFA at this point and popliteal arteries at this point. I took a 5 mm diameter angioplasty balloon inflated from the mid popliteal artery up to the common femoral artery encompassing the entire SFA with multiple inflations. Several areas of waists were taken which  resolved with angioplasty; however, there still remained residual  thrombosis and occlusion of the left SFA and popliteal. At this point, I elected to place a 90 cm total, 50 cm working length thrombolysis catheter through the sheath. This was parked in the below-knee popliteal artery.  It had its origin in the common femoral artery at the level of the sheath. Heparin will be run through the sheath and tPA run through the catheter.  The catheter was secured to the leg as was the sheath with Prolene suture. To gain durable venous access from her high risk of bleeding, a central line was then placed. I prepped the right neck and ultrasound was used to visualize a patent right jugular vein. This was then accessed under direct ultrasound guidance without difficulty with a Seldinger needle.  A J-wire was then placed after skin nick and dilatation. A triple lumen catheter was placed over the wire and the wire was removed. It was secured to skin at about 18 cm with 3 silk sutures and fluoroscopy was used to park the catheter tip in the distal superior vena cava just above the right atrium. All 3 lumens withdrew dark red nonpulsatile blood and flushed easily with sterile saline and a sterile dressing was placed. The patient tolerated the procedure well and was taken to the recovery room in stable condition.   ___________________________ Algernon Huxley, MD jsd:dd D: 05/09/2013 14:39:35 ET T: 05/09/2013 19:19:32 ET JOB#: [id number]  cc: Algernon Huxley, MD, <Dictator> Algernon Huxley MD ELECTRONICALLY SIGNED 05/17/2013 14:24

## 2014-05-04 NOTE — Op Note (Signed)
PATIENT NAME:  Natasha [name], Natasha [name] MR#:  [id number] DATE OF BIRTH:  May 05, 1928  DATE OF PROCEDURE:  05/08/2013  PREOPERATIVE DIAGNOSES:  1.  Ischemic left leg.  2.  Thrombosis of left superficial femoral artery and tibial vessels secondary to cessation of anticoagulation.   POSTOPERATIVE DIAGNOSES: 1.  Ischemic left leg.  2.  Thrombosis of left superficial femoral artery and tibial vessels secondary to cessation of anticoagulation.   PROCEDURE PERFORMED:  1.  Angiography left lower extremity through existing lysis catheter.  2.  Angiography of the left posterior tibial artery, third order catheter placement.  3.  Additional third order catheter placement with selective angiography of the left anterior tibial artery.  4.  Crosser atherectomy, left posterior tibial artery.  5.  Percutaneous transluminal angioplasty, left posterior tibial artery.  6.  Thrombectomy left superficial femoral artery, common femoral artery and popliteal artery.  7.  Percutaneous transluminal angioplasty to 4 mm with Lutonix balloon, left popliteal artery and to 6 mm, left common femoral and proximal superficial femoral artery with Lutonix balloon.   SURGEON: Katha Cabal, M.D.   SEDATION: Versed and fentanyl IV. Continuous ECG, pulse oximetry and cardiopulmonary monitoring is performed throughout the entire procedure by the interventional radiology nurse. Total sedation time was 3 hours.   ACCESS: 7-French sheath, right common femoral artery.   FLUOROSCOPY TIME: 24.7 minutes.   CONTRAST USED: Isovue 85 mL.   INDICATIONS: Natasha [name] is an 81 year old woman, who presented to the office with ischemic changes of her left foot. She recently was off of her anticoagulation for cholecystectomy and subsequently has developed the ischemic changes. Risks and benefits were reviewed for attempt at limb salvage. All questions were answered. The patient has expressed that everything is to be done to save her limb. She  was then sent directly from the office to the hospital and was initiated on lytic therapy yesterday. She has now returned to special procedures and for followup angiography with intervention.   DESCRIPTION OF PROCEDURE: The patient is brought to special procedures and placed in the supine position. After adequate sedation is achieved, the existing catheter is then utilized to image distally. There is extensive disease noted. There is occlusion of the anterior tibial just past its origin. There is non-filling of the posterior tibial. There is occlusion of the peroneal in its proximal one third. There is profound disease of the tibioperoneal trunk as well. There is also no flow noted on initial injection through the sheath through the SFA and popliteal. Given that she has been on t-PA for 14 hours with, what appears to be, quite minimal improvement, no further tPA infusions would appear warranted at this time.   A wire is then introduced through the catheter and catheter removed. A 7-French sheath was then advanced up and over the bifurcation, an Ansell sheath was used. A straight slip catheter was then negotiated down to the distal popliteal and using a combination of catheters and wires including angled glide Spartacore 0.014 CXI catheters, the tibioperoneal trunk was engaged. A Crosser S6 catheter was then prepped on the field. An Usher catheter was advanced over the 0.o14 wire. A 0.014 wire was removed and the S6 device was then advanced. Initially my hope was to advance the S6 down into the peroneal artery as this appeared to have fairly good distal runoff; however, the catheter would not negotiate into the peroneal, instead passed into the posterior tibial at approximately the midpoint. Followup angiography demonstrated that it was well placed  and positioned within the posterior tibial. It seemed to be advancing without much resistance and therefore we continued down the posterior tibial all the way to the  lateral plantar. Ultimately, a 0.014 grand slam wire was advanced through the channel that was created as the Usher catheter would only advance to the mid posterior tibial. The Usher catheter was removed. Obviously, the Crosser catheter had been removed as well. A 2.5 x 30 balloon was then advanced and 2 inflations were made, both to 14 atmospheres, each for approximately 2 minutes. Followup imaging demonstrated the posterior tibial was now widely patent with minimal evidence of disease, except that there remained a high-grade stenosis in the lateral plantar. The wire was then negotiated beyond the stenosis and a 2 mm x 2 cm balloon advanced across this final lesion. Followup imaging now demonstrated that there was rapid flow of contrast from injection in the popliteal down into the foot.   Attention was then turned to the anterior tibial, which was engaged with an angled Glidewire and straight catheter. The catheter was positioned just past the curve and imaging obtained, which demonstrated that the anterior tibial was patent down to the foot filling the dorsalis pedis.   Attention is now turned to the SFA and posterior tibial. AngioJet was then used. A lengthy pass of 180 was made. Followup imaging demonstrated it was now patent. However, there was a large thrombus burden within the common femoral and extending into the SFA. There was also a large thrombus burden in the popliteal. Attempts at further AngioJet did not alter the popliteal lesion and a 4 x 100 Lutonix balloon was delivered across the popliteal inflated to 12 atmospheres for 3 minutes. Two 6 x 100 balloons were then advanced across the common femoral and proximal SFA and again inflations were to 12 atmospheres for 3 minutes each. Followup imaging now demonstrates that there was essentially 0 flow. There is now an increased thrombus burden within the common femoral. There appears to be worsening of the thrombus burden within the popliteal as well.  Proximal anterior tibial and posterior tibial still remain patent.   Given this situation and the poor results from TPA and the worsening position, at this point, I elected to proceed with open surgery. Attempt at thrombectomy will be performed. Imaging on the table will be performed. Potential for femoral below-knee popliteal bypass exists with 2 vessel runoff. Therefore, the wire and sheath were pulled back to the right external. Oblique view was obtained, which demonstrated a fairly low puncture, which is not suitable for closure and therefore the long 7-French sheath was exchanged for a 7-French 11 cm sheath and the sheath will be removed in surgery as well.   INTERPRETATION: Initial views, as noted above, did not show any flow within the SFA after 14 hours of lysis. There is also diffuse tibial disease without a continuous pathway to the foot. Following Crosser atherectomy and subsequent angioplasty, the posterior tibial is now patent all the way to the foot filling a diseased lateral plantar. Following direct imaging of the anterior tibial, it is patent filling the dorsalis pedis. Following thrombectomy, there is a significant burden of thrombus both in the common femoral as well as the popliteal. This is not well treated with angioplasty nor did AngioJet have any affect. It is therefore elected to proceed with open repair as described above.   ____________________________ Katha Cabal, MD ggs:aw D: 05/08/2013 10:54:57 ET T: 05/08/2013 11:30:28 ET JOB#: [id number]  cc: Dolores Lory.  Delana Meyer, MD, <Dictator> Katha Cabal MD ELECTRONICALLY SIGNED 05/15/2013 10:05

## 2014-05-05 NOTE — Op Note (Signed)
PATIENT NAME:  Natasha [name], Natasha [name] MR#:  [id number] DATE OF BIRTH:  [birth year]  DATE OF PROCEDURE:  07/26/2011  PREOPERATIVE DIAGNOSES:  1. Peripheral arterial disease with claudication, left lower extremity.  2. Hypertension.  3. Chronic obstructive pulmonary disease.   POSTOPERATIVE DIAGNOSES: 1. Peripheral arterial disease with claudication, left lower extremity.  2. Hypertension.  3. Chronic obstructive pulmonary disease.   PROCEDURES:       1. Catheter placement into left popliteal artery from right femoral approach.  2. Aortogram and selective left lower extremity angiogram.  3. Percutaneous transluminal angioplasty of left distal common femoral artery and proximal superficial femoral artery with 5 and 6 mm diameter angioplasty balloon.  4. Percutaneous transluminal angioplasty of mid to distal superficial femoral artery and above-knee popliteal artery with 5 mm diameter angioplasty balloon.  5. Self-expanding stent placement to distal superficial femoral artery and above-knee popliteal artery with 6 mm diameter self-expanding stent for greater than 50% residual stenosis after angioplasty.  6. StarClose closure device, right femoral artery.   SURGEON: Algernon Huxley, M.D.   ANESTHESIA: Local with moderate conscious sedation.   ESTIMATED BLOOD LOSS: 25 mL. FLUOROSCOPY TIME: Approximately five minutes. CONTRAST USED: 70 mL.   INDICATION FOR PROCEDURE: 81 year old white female with short distance claudication left lower extremity causing lifestyle limitations. She is brought in today in an attempt at revascularization to alleviate her symptoms. Risks and benefits were discussed. Informed consent was obtained.   DESCRIPTION OF PROCEDURE: The patient is brought to the vascular interventional radiology suite. Groins were shaved and prepped and a sterile surgical field was created. The right femoral head was localized with fluoroscopy and the right femoral artery was accessed without  difficulty with a Seldinger needle. A J-wire and 5 French sheath was placed. Pigtail catheter was placed in the aorta at the L1 level and AP aortogram was performed. This showed no flow-limiting stenosis within the renal arteries. The aorta and iliac segments were widely patent. I then hooked the aortic bifurcation and advanced the left femoral head and selective left lower extremity angiogram was then performed. This demonstrated an approximately 70 to 80% stenosis at the origin of the superficial femoral artery. The superficial femoral artery was then patent over about 10 to 12 centimeters and then occluded. It reconstituted just above Hunter's canal with some disease in the above-knee popliteal artery. I then completed the runoff and saw  two-vessel runoff distally. The patient was systemically heparinized. A 6 French Ansel sheath was placed over a Terumo advantage wire and was able to cross the lesion without difficulty with the Terumo advantage wire and Kumpe catheter confirming intraluminal flow in the below-knee popliteal artery. I then replaced the wire. A 5 mm diameter angioplasty balloon was inflated initially at the distal endpoint down to the above-knee popliteal artery. We treated the area of occlusion in the SFA and then brought the balloon back up to treat the stenosis at the origin of the SFA with the balloon in the common femoral artery. The proximal lesion was retrieved with a 6 mm diameter angioplasty balloon as there was still greater than 50% residual stenosis after the initial angioplasty and this was in a location we could not put a stent and following this, there was only approximately 20 to 30% residual stenosis in the proximal SFA. The mid to distal superficial femoral artery had a good angiographic result until the distal endpoint down to Hunter's canal and in the above-knee popliteal artery. There was an approximately 70% residual  stenosis after angioplasty. I treated this area with a 6  mm diameter self-expanding stent, ironed out with a 5 mm balloon. Completion angiogram following this showed excellent flow through the stent with maintained runoff distally. At this point, I pulled the sheath back to the ipsilateral external iliac artery and oblique arteriogram was performed. StarClose closure device was deployed in the usual fashion with excellent hemostatic result. The patient tolerated the procedure well and was taken to the recovery room in stable condition.   ____________________________ Algernon Huxley, MD jsd:ap D: 07/26/2011 09:11:47 ET T: 07/26/2011 11:36:51 ET JOB#: [id number] cc: Algernon Huxley, MD, <Dictator> Rusty Aus, MD Algernon Huxley MD ELECTRONICALLY SIGNED 07/26/2011 12:06

## 2014-05-08 ENCOUNTER — Ambulatory Visit
Admit: 2014-05-08 | Disposition: A | Discharge status: Home / Self Care | Payer: Self-pay | Attending: Vascular Surgery | Admitting: Vascular Surgery

## 2014-05-08 LAB — BUN: BUN: 21 mg/dL — ABNORMAL HIGH

## 2014-05-08 LAB — CREATININE, SERUM
CREATININE: 0.74 mg/dL
EGFR (African American): >60
EGFR (Non-African Amer.): >60

## 2014-05-12 NOTE — Op Note (Signed)
PATIENT NAME:  Natasha [name], Natasha [name] MR#:  [id number] DATE OF BIRTH:  1929/01/07  DATE OF PROCEDURE:  05/08/2014  PREOPERATIVE DIAGNOSES: 1. 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  2. History of left femoral to distal bypass for limb salvage.  3. Left lower extremity swelling.   4. Limb pain.   POSTOPERATIVE DIAGNOSES:   1. 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  2. History of left femoral to distal bypass for limb salvage.  3. Left lower extremity swelling.   4. Limb pain.   PROCEDURES: 1. Ultrasound guidance for vascular access to right femoral artery.  2. Catheter placement to left anterior tibial artery from right femoral approach.  3. Aortogram and selective left lower extremity angiogram.  4. Percutaneous transluminal angioplasty of distal bypass anastomosis and proximal anterior tibial artery with 3 mm diameter conventional and 4 mm diameter drug-coated angioplasty balloon.  5. StarClose closure device, right femoral artery.   SURGEON: Algernon Huxley, MD   ANESTHESIA: Local with moderate conscious sedation.   BLOOD LOSS: Minimal.   INDICATION FOR PROCEDURE: This is an 81 year old female well-known to [place] for her vascular issues. She has worsening pain of the left lower extremity with activity, consistent with claudication. She has severe swelling of the left lower extremity, likely from lymphedema from chronic scarring of the lymphatic channels with her multiple surgeries and procedures. She has a noninvasive study showing stenosis at the distal bypass anastomosis and into the tibial artery. This is a prethrombotic situation and to avoid loss of the bypass graft, intervention is recommended. Risks and benefits are discussed. Informed consent is obtained.   DESCRIPTION OF PROCEDURE: The patient is brought to the vascular suite. Groins were shaved and prepped and a sterile surgical  field was created. The right femoral artery was visualized with ultrasound. Due to poor anatomic landmarks, multiple previous accesses and not easily palpable pulse, the right femoral artery was accessed under direct ultrasound guidance without difficulty with a Seldinger needle and a patent right femoral artery was seen. A permanent image was then recorded. A pigtail catheter was placed in the aorta at the L1-L2 level and AP aortogram was performed. The renal arteries were widely patent. The aorta and iliac segments were widely patent. I then crossed the area of bifurcation and advanced to the left femoral head and selective left lower angiogram was then performed. This showed a somewhat slowed traversion of contrast through the bypass graft. The bypass graft itself was patent until the distal anastomosis and into the proximal anterior tibial artery, there was a short segment of what appeared to be moderate to potentially high-grade stenosis that appeared to be from intimal hyperplasia. The degree of stenosis measured out in the 70% to 75% range. The patient was then heparinized. A 6 French Ansell sheath was placed over a [employer] wire. I then advanced a Kumpe catheter down into the bypass graft to get a little better look at the stenosis and again, was confirmed what we originally saw I then advanced through the stenosis with an Advantage wire and a Kumpe catheter, and confirmed intraluminal flow in the anterior tibial artery, but on the stenosis. This allowed [place] evaluation of the distal runoff. She had continuous one-vessel runoff to the anterior tibial artery into the foot, with a reasonably normal flow in the foot from this vessel. I then replaced the wire. I first used a 3 mm diameter x 4  cm length angioplasty balloon. This was slightly undersized, but she had a nice generous anterior tibial artery. We upsized to a 4 mm diameter x 4 cm length Lutonix drug-coated angioplasty balloon. Completion  angiogram following this showed markedly improved flow with only about a 10% to 15% residual stenosis identified. This was not flow-limiting. At this point, I elected to terminate the procedure. The sheath was removed. StarClose closure device was deployed in the usual fashion with excellent hemostatic result. The patient tolerated the procedure well and was taken to the recovery room in stable condition.      ____________________________ Algernon Huxley, MD jsd:mw D: 05/08/2014 13:04:34 ET T: 05/08/2014 16:42:21 ET JOB#: [id number]  cc: Algernon Huxley, MD, <Dictator> Algernon Huxley MD ELECTRONICALLY SIGNED 05/10/2014 17:27

## 2014-05-12 NOTE — H&P (Signed)
PATIENT NAME:  NAKESHA, SAMORA MR#:  [id number] DATE OF BIRTH:  1928/05/12  DATE OF ADMISSION:  01/26/2014  REFERRING PHYSICIAN: Wells Guiles L. Reita Cliche, MD  PRIMARY CARE PHYSICIAN: Rusty Aus, MD, [facility]   CHIEF COMPLAINT: Left leg swelling.   HISTORY OF PRESENT ILLNESS: An 81 year old Caucasian female who is extremely hard of hearing with past medical history of essential hypertension, gastroesophageal reflux disease, peripheral vascular disease, presented with left leg swelling. Describes a 2-day duration of left leg swelling with associated leg pain described as "pain," 5 to 6/10 intensity, worse with walking, no relieving factors, no radiation, also associated with edema and erythema. She does describe having an ulceration on her toe, which she stated had some drainage; however, no foul order noted. She attributes this to recently getting a pedicure. She had fevers at home, 104 to 103 degrees Fahrenheit at home, thus presented to the hospital for further workup and evaluation.   REVIEW OF SYSTEMS:  CONSTITUTIONAL: Positive for fevers, chills, fatigue, weakness.  EYES: Denies blurred vision, double vision, eye pain.  EARS, NOSE, THROAT: Denies tinnitus, ear pain, hearing loss.  RESPIRATORY: Denies cough, wheeze, shortness of breath.  CARDIOVASCULAR: Denies chest pain, palpitations, edema.  GASTROINTESTINAL: Denies nausea, vomiting, diarrhea, abdominal pain.  GENITOURINARY: Denies dysuria or hematuria. ENDOCRINE: Denies nocturia or thyroid problems.  HEMATOLOGIC AND LYMPHATIC: Denies easy bruising or bleeding.  SKIN: Positive for erythematous left lower extremity anterior portion as described above. Otherwise, no further rashes or lesions.  MUSCULOSKELETAL: Denies pain in neck, back, shoulder, knees, hips, or other arthritic symptoms.  NEUROLOGIC: Denies paralysis or paresthesias.  PSYCHIATRIC: Denies anxiety or depressive symptoms.   Otherwise, full review of systems performed  by me is negative.   PAST MEDICAL HISTORY: Essential hypertension, gastroesophageal reflux disease without esophagitis, history of peripheral vascular disease, status post bypass surgeries, hyperlipidemia, unspecified.   SOCIAL HISTORY: No alcohol, tobacco, or drug usage.   FAMILY HISTORY: Positive for colon cancer. No cardiovascular or pulmonary disorders.   ALLERGIES: CODEINE, HYDROCODONE, AS WELL AS TRAMADOL.   HOME MEDICATIONS: Include Pulmicort 180 mcg inhalation 2 puffs b.i.d., oxycodone 5 mg p.o. q. 4 hours as needed for pain, diltiazem 180 mg p.o. daily, Xarelto 20 mg p.o. at bedtime, Topamax 50 mg p.o. b.i.d., simvastatin 20 mg p.o. at bedtime, hydrochlorothiazide/triamterene 25/37.5 mg p.o. daily, pramipexole 0.25 mg p.o. b.i.d., Reclast injections yearly, ProAir 90 mcg inhalation 2 puffs b.i.d. as needed for wheezing, Lasix 20 mg p.o. b.i.d., Singular 10 mg p.o. daily, potassium 20 mEq p.o. b.i.d., Pletal 100 mg p.o. b.i.d., melatonin 6 mg p.o. at bedtime, Nexium 40 mg p.o. b.i.d., calcium 600 plus vitamin D 500 one tab p.o. b.i.d.   PHYSICAL EXAMINATION:  VITAL SIGNS: Temperature 98.7, heart rate 110, respirations 20, blood pressure 155/72, saturating 96% on room air. Weight 53.5 kg, BMI 23.1.  GENERAL: Weak, frail-appearing Caucasian female currently in no acute distress.  HEAD: Normocephalic, atraumatic.  EYES: Pupils equal, round, reactive to light. Extraocular muscles intact. No scleral icterus.  MOUTH: Moist mucous membranes. Dentition intact. No abscess noted.  EAR, NOSE, THROAT: Clear without exudates. No external lesions.  NECK: Supple. No thyromegaly. No nodules. No JVD.  PULMONARY: Clear to auscultation bilaterally without wheezes, rales, or rhonchi. No use of accessory muscles. Good respiratory effort.  CHEST: Nontender to palpation.  CARDIOVASCULAR: S1, S2. Regular rate and rhythm with a 3/6 systolic ejection murmur best heard at the left upper sternal border; 1+ edema  of left lower extremity only.  Pedal pulses diminished.  GASTROINTESTINAL: Soft, nontender, nondistended. No masses. Positive bowel sounds. No hepatosplenomegaly.  MUSCULOSKELETAL: Range of motion full in all extremities. There is, however, edema of left lower extremity essentially from the ankle upwards to about mid shin with associated erythema, which is warm to touch.  SKIN: A small ulceration over the second toe of the left foot with surrounding erythema going up to about mid shin. Once again warm to touch. Otherwise, no further lesions, rashes, or cyanosis. Skin warm, dry. Turgor intact.  PSYCHIATRIC: Mood and affect within normal limits. The patient is awake, alert, oriented x 3, though extremely hard of hearing. Insight and judgment intact.   LABORATORY DATA: Ultrasound of the legs performed reveals no DVTs. She had a chest x-ray performed which reveals cardiomegaly with minimal bibasilar atelectasis; however, no acute cardiopulmonary process. Remainder of laboratory data: Sodium 133, potassium 3.3, chloride of 97, bicarbonate of 26, BUN 25, creatinine 0.96, glucose of 102. WBC of 14, hemoglobin 10.8, platelets of 337,000. Urinalysis negative for evidence of infection.   ASSESSMENT AND PLAN:  1.  An 82 year old Caucasian female with history of essential hypertension, gastroesophageal reflux disease, peripheral vascular disease, presenting with left leg swelling along with cellulitis of the left lower extremity on initial visit. Intravenous fluid hydration. Blood cultures have already been sent. We will followup culture and give vancomycin for antibiotic coverage given history of methicillin-resistant Staphylococcus aureus.  2.  Hypokalemia. Replace potassium to goal of 4 to 5.  3.  Peripheral vascular disease. Continue Pletal.  4.  Gastroesophageal reflux disease without esophagitis. Continue proton pump inhibitor therapy.  5.  Hypertension. Cardizem.  6.  Venous thromboembolism prophylaxis. She  is on Xarelto chronically.  CODE STATUS: The patient is FULL CODE.   TIME SPENT: 45 minutes.    ____________________________ Aaron Mose. Hower, MD dkh:ts D: 01/26/2014 [id number] ET T: 01/26/2014 21:31:58 ET JOB#: [id number]  cc: Aaron Mose. Hower, MD, <Dictator> DAVID Woodfin Ganja MD ELECTRONICALLY SIGNED 01/28/2014 1:05

## 2014-05-12 NOTE — Discharge Summary (Signed)
PATIENT NAME:  Natasha [name], VISTA MR#:  [id number] DATE OF BIRTH:  05-28-1928  DATE OF ADMISSION:  01/26/2014 DATE OF DISCHARGE:  01/28/2014    DISCHARGE DIAGNOSES:  1.  Left leg cellulitis with lower extremity edema.  2.  Moderate persistent asthma.  3.  Peripheral vascular disease. 4.  Atrial fibrillation. 5.  Restless leg syndrome.   DISCHARGE MEDICATIONS: Topamax 50 mg b.i.d., diltiazem ER 180 mg daily.  ProAir 2 puffs b.i.d., simvastatin 20 mg at bedtime, Pulmicort Flexhaler 2 puffs b.i.d.  Calcium vitamin D b.i.d., Nexium 40 mg b.i.d., singular 10 mg at bedtime, Xarelto 20 mg at bedtime, Cilostazol 100 mg b.i.d., Lasix 20 mg b.i.d., Flonase 2 sprays daily, potassium chloride 20 mEq 5 tabs daily, Mirapex 0.5 mg 2:00 a.m. and 1:00 p.m., melatonin 5 mg daily.   REASON FOR ADMISSION: An 81 year old female presents with redness of the left lower leg. Please see H and P for history of present illness, past medical and physical exam.   HOSPITAL COURSE: The patient was admitted. She was diuresed. Her edema resolved. She is placed on IV vancomycin and the erythema resolved fairly quickly. She was back to baseline state.  She does not handle oral antibiotics well.  There is no clear indication for further antibiotics. She will follow up with podiatry in regard to a lesion on her toe.  She will see Dr. Sabra Heck 1 week.     ____________________________ Rusty Aus, MD mfm:DT D: 01/28/2014 07:25:31 ET T: 01/28/2014 09:16:52 ET JOB#: [id number]  cc: Rusty Aus, MD, <Dictator> [place]den MD ELECTRONICALLY SIGNED 01/29/2014 8:17

## 2014-05-31 ENCOUNTER — Other Ambulatory Visit: Payer: Self-pay | Admitting: Internal Medicine

## 2014-05-31 DIAGNOSIS — R1031 Right lower quadrant pain: Secondary | ICD-10-CM

## 2014-06-07 ENCOUNTER — Ambulatory Visit
Admission: RE | Admit: 2014-06-07 | Discharge: 2014-06-07 | Disposition: A | Discharge status: Home / Self Care | Payer: Medicare Other | Source: Ambulatory Visit | Attending: Internal Medicine | Admitting: Internal Medicine

## 2014-06-07 DIAGNOSIS — R197 Diarrhea, unspecified: Secondary | ICD-10-CM | POA: Diagnosis not present

## 2014-06-07 DIAGNOSIS — R1031 Right lower quadrant pain: Secondary | ICD-10-CM | POA: Diagnosis not present

## 2014-06-07 HISTORY — DX: Essential (primary) hypertension: I10

## 2014-06-07 MED ORDER — IOHEXOL 300 MG/ML  SOLN
100.0000 mL | Freq: Once | INTRAMUSCULAR | Status: AC | PRN
  Administered 2014-06-07: 100 mL via INTRAVENOUS

## 2014-06-12 ENCOUNTER — Other Ambulatory Visit: Payer: Medicare Other

## 2014-08-01 ENCOUNTER — Other Ambulatory Visit: Payer: Self-pay | Admitting: Internal Medicine

## 2014-08-01 DIAGNOSIS — M5116 Intervertebral disc disorders with radiculopathy, lumbar region: Secondary | ICD-10-CM

## 2014-08-07 ENCOUNTER — Ambulatory Visit
Admission: RE | Admit: 2014-08-07 | Discharge: 2014-08-07 | Disposition: A | Discharge status: Home / Self Care | Payer: Medicare Other | Source: Ambulatory Visit | Attending: Internal Medicine | Admitting: Internal Medicine

## 2014-08-07 DIAGNOSIS — M419 Scoliosis, unspecified: Secondary | ICD-10-CM | POA: Diagnosis not present

## 2014-08-07 DIAGNOSIS — M5116 Intervertebral disc disorders with radiculopathy, lumbar region: Secondary | ICD-10-CM

## 2014-08-07 DIAGNOSIS — M47896 Other spondylosis, lumbar region: Secondary | ICD-10-CM | POA: Diagnosis not present

## 2014-08-07 DIAGNOSIS — M545 Low back pain: Secondary | ICD-10-CM | POA: Diagnosis present

## 2014-08-11 ENCOUNTER — Encounter: Payer: Self-pay | Admitting: Emergency Medicine

## 2014-08-11 ENCOUNTER — Emergency Department
Admission: EM | Admit: 2014-08-11 | Discharge: 2014-08-11 | Disposition: A | Discharge status: Home / Self Care | Payer: Medicare Other | Attending: Emergency Medicine | Admitting: Emergency Medicine

## 2014-08-11 ENCOUNTER — Emergency Department: Payer: Medicare Other

## 2014-08-11 DIAGNOSIS — J159 Unspecified bacterial pneumonia: Secondary | ICD-10-CM | POA: Diagnosis not present

## 2014-08-11 DIAGNOSIS — R0789 Other chest pain: Secondary | ICD-10-CM | POA: Insufficient documentation

## 2014-08-11 DIAGNOSIS — I10 Essential (primary) hypertension: Secondary | ICD-10-CM | POA: Diagnosis not present

## 2014-08-11 DIAGNOSIS — J189 Pneumonia, unspecified organism: Secondary | ICD-10-CM

## 2014-08-11 DIAGNOSIS — R509 Fever, unspecified: Secondary | ICD-10-CM | POA: Diagnosis present

## 2014-08-11 DIAGNOSIS — Z87891 Personal history of nicotine dependence: Secondary | ICD-10-CM | POA: Diagnosis not present

## 2014-08-11 LAB — COMPREHENSIVE METABOLIC PANEL
ALBUMIN: 3.3 g/dL — AB (ref 3.5–5.0)
ALK PHOS: 87 U/L (ref 38–126)
ALT: 15 U/L (ref 14–54)
AST: 19 U/L (ref 15–41)
Anion gap: 9 (ref 5–15)
BILIRUBIN TOTAL: 0.3 mg/dL (ref 0.3–1.2)
BUN: 27 mg/dL — ABNORMAL HIGH (ref 6–20)
CO2: 27 mmol/L (ref 22–32)
CREATININE: 0.89 mg/dL (ref 0.44–1.00)
Calcium: 9.1 mg/dL (ref 8.9–10.3)
Chloride: 100 mmol/L — ABNORMAL LOW (ref 101–111)
GFR calc Af Amer: >60 mL/min (ref >60)
GFR calc non Af Amer: 58 mL/min — ABNORMAL LOW (ref >60)
Glucose, Bld: 108 mg/dL — ABNORMAL HIGH (ref 65–99)
POTASSIUM: 3.3 mmol/L — AB (ref 3.5–5.1)
Sodium: 136 mmol/L (ref 135–145)
Total Protein: 6.7 g/dL (ref 6.5–8.1)

## 2014-08-11 LAB — CBC WITH DIFFERENTIAL/PLATELET
BASOS ABS: 0.1 10*3/uL (ref 0–0.1)
Basophils Relative: 1 %
Eosinophils Absolute: 0 10*3/uL (ref 0–0.7)
Eosinophils Relative: 0 %
HEMATOCRIT: 31.4 % — AB (ref 35.0–47.0)
HEMOGLOBIN: 10 g/dL — AB (ref 12.0–16.0)
LYMPHS PCT: 13 %
Lymphs Abs: 1.2 10*3/uL (ref 1.0–3.6)
MCH: 25.4 pg — ABNORMAL LOW (ref 26.0–34.0)
MCHC: 31.9 g/dL — ABNORMAL LOW (ref 32.0–36.0)
MCV: 79.6 fL — ABNORMAL LOW (ref 80.0–100.0)
Monocytes Absolute: 0.5 10*3/uL (ref 0.2–0.9)
Monocytes Relative: 6 %
NEUTROS ABS: 7.4 10*3/uL — AB (ref 1.4–6.5)
NEUTROS PCT: 80 %
PLATELETS: 237 10*3/uL (ref 150–440)
RBC: 3.94 MIL/uL (ref 3.80–5.20)
RDW: 19.5 % — AB (ref 11.5–14.5)
WBC: 9.2 10*3/uL (ref 3.6–11.0)

## 2014-08-11 LAB — URINALYSIS COMPLETE WITH MICROSCOPIC (ARMC ONLY)
Bacteria, UA: NONE SEEN
Bilirubin Urine: NEGATIVE
Glucose, UA: NEGATIVE mg/dL
Hgb urine dipstick: NEGATIVE
Ketones, ur: NEGATIVE mg/dL
LEUKOCYTES UA: NEGATIVE
Nitrite: NEGATIVE
PROTEIN: NEGATIVE mg/dL
Specific Gravity, Urine: 1.012 (ref 1.005–1.030)
pH: 5 (ref 5.0–8.0)

## 2014-08-11 MED ORDER — AZITHROMYCIN 500 MG IV SOLR
500.0000 mg | Freq: Once | INTRAVENOUS | Status: AC
  Administered 2014-08-11: 500 mg via INTRAVENOUS
  Filled 2014-08-11: qty 500

## 2014-08-11 MED ORDER — LEVOFLOXACIN 750 MG PO TABS: 750.0000 mg | ORAL_TABLET | Freq: Every day | ORAL | Status: DC

## 2014-08-11 MED ORDER — DEXTROSE 5 % IV SOLN
1.0000 g | Freq: Once | INTRAVENOUS | Status: AC
  Administered 2014-08-11: 1 g via INTRAVENOUS
  Filled 2014-08-11: qty 10

## 2014-08-11 NOTE — ED Provider Notes (Addendum)
[facility] Emergency Department Provider Note     Time seen: ----------------------------------------- 5:33 PM on 08/11/2014 -----------------------------------------    I have reviewed the triage vital signs and the nursing notes.   HISTORY  Chief Complaint Fever    HPI Natasha [name] is a 81 y.o. female who is brought to ER from [facility] with fever and low oxygen saturations. Patient was complaining of some pain in the right side of her chest. She denies any other complaints. Patient is very hard of hearing as well as her husband, is difficult to obtain a history.   Past Medical History  Diagnosis Date  . GERD (gastroesophageal reflux disease)   . Asthma   . Anemia   . Arthritis   . H/O blood clots 2014  . Gallstones 2015  . Hypertension     Patient Active Problem List   Diagnosis Date Noted  . Calculus of gallbladder with other cholecystitis, without mention of obstruction 04/20/2013    Past Surgical History  Procedure Laterality Date  . Abdominal hysterectomy  1960  . Appendectomy  1946  . Cochlear implant  2009  . Eye surgery  [phone]    cataract  . Stent placement  2006-2014    multiple stent placements in legs  . Colonoscopy  2015    Dr. Rayann Heman   . Cholecystectomy  04-24-13    Allergies Codeine and Hydrocodone  Social History History  Substance Use Topics  . Smoking status: Former Research scientist (life sciences)  . Smokeless tobacco: Not on file  . Alcohol Use: No    Review of Systems Constitutional: Positive for fever Eyes: Negative for visual changes. ENT: Negative for sore throat. Cardiovascular: Positive for right-sided chest pain Respiratory: Negative for shortness of breath. Gastrointestinal: Negative for abdominal pain, vomiting and diarrhea. Genitourinary: Negative for dysuria. Musculoskeletal: Negative for back pain. Skin: Negative for rash. Neurological: Negative for headaches, focal weakness or  numbness.  10-point ROS otherwise negative.  ____________________________________________   PHYSICAL EXAM:  VITAL SIGNS: ED Triage Vitals  Enc Vitals Group     BP 08/11/14 1416 124/52 mmHg     Pulse Rate 08/11/14 1416 93     Resp 08/11/14 1416 20     Temp 08/11/14 1416 99.5 F (37.5 C)     Temp Source 08/11/14 1416 Oral     SpO2 08/11/14 1416 93 %     Weight 08/11/14 1416 132 lb (59.875 kg)     Height 08/11/14 1416 4\' 11"  (1.499 m)     Head Cir --      Peak Flow --      Pain Score --      Pain Loc --      Pain Edu? --      Excl. in [place]? --     Constitutional: Alert and oriented. Well appearing and in no distress. Eyes: Conjunctivae are normal. PERRL. Normal extraocular movements. ENT   Head: Normocephalic and atraumatic.   Nose: No congestion/rhinnorhea.   Mouth/Throat: Mucous membranes are moist.   Neck: No stridor. Cardiovascular: Normal rate, regular rhythm. Normal and symmetric distal pulses are present in all extremities. No murmurs, rubs, or gallops. Respiratory: Normal respiratory effort without tachypnea nor retractions. Breath sounds are clear and equal bilaterally. No wheezes/rales/rhonchi. Gastrointestinal: Soft and nontender. No distention. No abdominal bruits. There is no CVA tenderness. Musculoskeletal: Nontender with normal range of motion in all extremities. No joint effusions.  No lower extremity tenderness nor edema. Neurologic:  Normal speech and language. No  gross focal neurologic deficits are appreciated. Speech is normal. No gait instability. Skin:  Skin is warm, dry and intact. No rash noted. Psychiatric: Mood and affect are normal. Speech and behavior are normal. Patient exhibits appropriate insight and judgment.   ____________________________________________  ED COURSE:  Pertinent labs & imaging results that were available during my care of the patient were reviewed by me and considered in my medical decision making (see chart for  details). Patient will need labs and x-rays. ____________________________________________    LABS (pertinent positives/negatives)  Labs Reviewed  COMPREHENSIVE METABOLIC PANEL - Abnormal; Notable for the following:    Potassium 3.3 (*)    Chloride 100 (*)    Glucose, Bld 108 (*)    BUN 27 (*)    Albumin 3.3 (*)    GFR calc non Af Amer 58 (*)    All other components within normal limits  CBC WITH DIFFERENTIAL/PLATELET - Abnormal; Notable for the following:    Hemoglobin 10.0 (*)    HCT 31.4 (*)    MCV 79.6 (*)    MCH 25.4 (*)    MCHC 31.9 (*)    RDW 19.5 (*)    Neutro Abs 7.4 (*)    All other components within normal limits  URINALYSIS COMPLETEWITH MICROSCOPIC ([facility] ONLY) - Abnormal; Notable for the following:    Color, Urine YELLOW (*)    APPearance CLEAR (*)    Squamous Epithelial / LPF 0-5 (*)    All other components within normal limits  URINE CULTURE    RADIOLOGY Images were viewed by me  IMPRESSION: New airspace process over the right upper lobe and right lung base likely pneumonia.  Mild stable cardiomegaly.  Moderate size hiatal hernia unchanged.  Mild anterior wedge compression of a vertebral body at the thoracolumbar junction unchanged.  ____________________________________________  FINAL ASSESSMENT AND PLAN  Pneumonia  Plan: Patient with labs and imaging as dictated above. Patient will receive IV Rocephin and azithromycin for community-acquired pneumonia. She is not hypoxic here. She is stable for follow-up 1-2 days with her doctor for reevaluation. Continue outpatient course with by mouth Levaquin.   Earleen Newport, MD   Earleen Newport, MD 08/11/14 1737  Earleen Newport, MD 08/11/14 [id number]

## 2014-08-11 NOTE — ED Notes (Signed)
Pt states husband just left for home - tried to find him but apparently he already left. Attempted to call home number - no answer

## 2014-08-11 NOTE — ED Notes (Signed)
Spoke with husband and he will be here at approx 5 mins - pt requesting to wait in waiting room. Taken in w/c to waiting room

## 2014-08-11 NOTE — Discharge Instructions (Signed)

## 2014-08-11 NOTE — ED Notes (Signed)
brooght over from [facility] with fever and low o2 sat

## 2014-08-13 LAB — URINE CULTURE

## 2014-08-14 ENCOUNTER — Ambulatory Visit (INDEPENDENT_AMBULATORY_CARE_PROVIDER_SITE_OTHER): Payer: Medicare Other

## 2014-08-14 ENCOUNTER — Ambulatory Visit (INDEPENDENT_AMBULATORY_CARE_PROVIDER_SITE_OTHER): Payer: Medicare Other | Admitting: Podiatry

## 2014-08-14 VITALS — BP 111/60 | HR 89 | Resp 18 | Ht 60.0 in | Wt 134.0 lb

## 2014-08-14 DIAGNOSIS — L603 Nail dystrophy: Secondary | ICD-10-CM

## 2014-08-14 DIAGNOSIS — M79606 Pain in leg, unspecified: Secondary | ICD-10-CM

## 2014-08-14 DIAGNOSIS — M2042 Other hammer toe(s) (acquired), left foot: Secondary | ICD-10-CM

## 2014-08-14 DIAGNOSIS — Q828 Other specified congenital malformations of skin: Secondary | ICD-10-CM

## 2014-08-14 DIAGNOSIS — B351 Tinea unguium: Secondary | ICD-10-CM | POA: Diagnosis not present

## 2014-08-14 DIAGNOSIS — M79673 Pain in unspecified foot: Secondary | ICD-10-CM | POA: Diagnosis not present

## 2014-08-14 NOTE — Progress Notes (Signed)
   Subjective:    Patient ID: Natasha [name], female    DOB: 1928/09/19, 81 y.o.   MRN: [id number]  HPI I have a callus on my right foot and painful hammertoes on the left foot, I had surgery on my left leg and it swells    Review of Systems  HENT: Positive for hearing loss.   Respiratory: Positive for cough, chest tightness, shortness of breath and wheezing.   Cardiovascular: Positive for leg swelling.  Musculoskeletal: Positive for back pain.  Neurological: Positive for weakness.       Difficulty walking   All other systems reviewed and are negative.      Objective:   Physical Exam: I have reviewed her past medical history medications allergies surgery social history and review of systems. I have also reviewed her chief complaint and her statement. Pulses are palpable bilateral and neurologic sensorium is intact deep tendon reflexes are intact bilateral brisk and equal. Muscle strength +5 over 5 dorsiflexion plantar flexors and inverters everters all into the musculature is intact. Orthopedic evaluation of his wrist hammertoe deformities with distal clavi and porokeratosis. Nails are thick yellow dystrophic clinic for mycotic and painful.        Assessment & Plan:  Assessment: Pain and limb secondary to onychomycosis porokeratosis and hammertoe deformity with osteoarthritis.  Assessment: Debrided all reactive hyperkeratoses. Placed padding. An debrided nails 1 through 5 bilateral

## 2014-08-17 ENCOUNTER — Emergency Department
Admission: EM | Admit: 2014-08-17 | Discharge: 2014-08-17 | Disposition: A | Discharge status: Home / Self Care | Payer: Medicare Other | Attending: Emergency Medicine | Admitting: Emergency Medicine

## 2014-08-17 ENCOUNTER — Other Ambulatory Visit: Payer: Self-pay

## 2014-08-17 ENCOUNTER — Emergency Department: Payer: Medicare Other

## 2014-08-17 ENCOUNTER — Encounter: Payer: Self-pay | Admitting: Emergency Medicine

## 2014-08-17 DIAGNOSIS — Z8701 Personal history of pneumonia (recurrent): Secondary | ICD-10-CM | POA: Insufficient documentation

## 2014-08-17 DIAGNOSIS — Z792 Long term (current) use of antibiotics: Secondary | ICD-10-CM | POA: Diagnosis not present

## 2014-08-17 DIAGNOSIS — R0789 Other chest pain: Secondary | ICD-10-CM | POA: Insufficient documentation

## 2014-08-17 DIAGNOSIS — Z79899 Other long term (current) drug therapy: Secondary | ICD-10-CM | POA: Diagnosis not present

## 2014-08-17 DIAGNOSIS — Z87891 Personal history of nicotine dependence: Secondary | ICD-10-CM | POA: Insufficient documentation

## 2014-08-17 DIAGNOSIS — I10 Essential (primary) hypertension: Secondary | ICD-10-CM | POA: Diagnosis not present

## 2014-08-17 DIAGNOSIS — M549 Dorsalgia, unspecified: Secondary | ICD-10-CM | POA: Diagnosis present

## 2014-08-17 LAB — CBC WITH DIFFERENTIAL/PLATELET
Basophils Absolute: 0.1 10*3/uL (ref 0–0.1)
Basophils Relative: 1 %
Eosinophils Absolute: 0.1 10*3/uL (ref 0–0.7)
Eosinophils Relative: 1 %
HEMATOCRIT: 29.6 % — AB (ref 35.0–47.0)
Hemoglobin: 9.6 g/dL — ABNORMAL LOW (ref 12.0–16.0)
LYMPHS ABS: 1.6 10*3/uL (ref 1.0–3.6)
Lymphocytes Relative: 21 %
MCH: 25.7 pg — ABNORMAL LOW (ref 26.0–34.0)
MCHC: 32.4 g/dL (ref 32.0–36.0)
MCV: 79.2 fL — ABNORMAL LOW (ref 80.0–100.0)
MONOS PCT: 10 %
Monocytes Absolute: 0.8 10*3/uL (ref 0.2–0.9)
NEUTROS PCT: 67 %
Neutro Abs: 5.1 10*3/uL (ref 1.4–6.5)
Platelets: 314 10*3/uL (ref 150–440)
RBC: 3.73 MIL/uL — AB (ref 3.80–5.20)
RDW: 19.7 % — ABNORMAL HIGH (ref 11.5–14.5)
WBC: 7.7 10*3/uL (ref 3.6–11.0)

## 2014-08-17 LAB — COMPREHENSIVE METABOLIC PANEL
ALK PHOS: 90 U/L (ref 38–126)
ALT: 14 U/L (ref 14–54)
AST: 26 U/L (ref 15–41)
Albumin: 3.1 g/dL — ABNORMAL LOW (ref 3.5–5.0)
Anion gap: 9 (ref 5–15)
BILIRUBIN TOTAL: 0.5 mg/dL (ref 0.3–1.2)
BUN: 28 mg/dL — AB (ref 6–20)
CHLORIDE: 98 mmol/L — AB (ref 101–111)
CO2: 29 mmol/L (ref 22–32)
CREATININE: 0.93 mg/dL (ref 0.44–1.00)
Calcium: 9.1 mg/dL (ref 8.9–10.3)
GFR calc Af Amer: >60 mL/min (ref >60)
GFR calc non Af Amer: 55 mL/min — ABNORMAL LOW (ref >60)
GLUCOSE: 103 mg/dL — AB (ref 65–99)
Potassium: 3.8 mmol/L (ref 3.5–5.1)
Sodium: 136 mmol/L (ref 135–145)
Total Protein: 6.7 g/dL (ref 6.5–8.1)

## 2014-08-17 MED ORDER — HYDROMORPHONE HCL 2 MG PO TABS: 2.0000 mg | ORAL_TABLET | Freq: Three times a day (TID) | ORAL | Status: DC | PRN

## 2014-08-17 MED ORDER — HYDROMORPHONE HCL 1 MG/ML IJ SOLN
0.5000 mg | Freq: Once | INTRAMUSCULAR | Status: AC
  Administered 2014-08-17: 0.5 mg via INTRAVENOUS
  Filled 2014-08-17: qty 1

## 2014-08-17 MED ORDER — ONDANSETRON HCL 4 MG/2ML IJ SOLN
4.0000 mg | Freq: Once | INTRAMUSCULAR | Status: AC
  Administered 2014-08-17: 4 mg via INTRAVENOUS
  Filled 2014-08-17: qty 2

## 2014-08-17 NOTE — ED Notes (Signed)
Patient transported to X-ray 

## 2014-08-17 NOTE — Discharge Instructions (Signed)
Chest Wall Pain Chest wall pain is pain in or around the bones and muscles of your chest. It may take up to 6 weeks to get better. It may take longer if you must stay physically active in your work and activities.  CAUSES  Chest wall pain may happen on its own. However, it may be caused by:  A viral illness like the flu.  Injury.  Coughing.  Exercise.  Arthritis.  Fibromyalgia.  Shingles. HOME CARE INSTRUCTIONS   Avoid overtiring physical activity. Try not to strain or perform activities that cause pain. This includes any activities using your chest or your abdominal and side muscles, especially if heavy weights are used.  Put ice on the sore area.  Put ice in a plastic bag.  Place a towel between your skin and the bag.  Leave the ice on for 15-20 minutes per hour while awake for the first 2 days.  Only take over-the-counter or prescription medicines for pain, discomfort, or fever as directed by your caregiver. SEEK IMMEDIATE MEDICAL CARE IF:   Your pain increases, or you are very uncomfortable.  You have a fever.  Your chest pain becomes worse.  You have new, unexplained symptoms.  You have nausea or vomiting.  You feel sweaty or lightheaded.  You have a cough with phlegm (sputum), or you cough up blood. MAKE SURE YOU:   Understand these instructions.  Will watch your condition.  Will get help right away if you are not doing well or get worse. Document Released: 12/28/2004 Document Revised: 03/22/2011 Document Reviewed: 08/24/2010 [facility] Patient Information 2015 [place], [place]. This information is not intended to replace advice given to you by your health care provider. Make sure you discuss any questions you have with your health care provider.   Return immediately if condition worsens. Please continue with her antibiotic. Contact your primary physician on Monday for further outpatient follow-up.

## 2014-08-17 NOTE — ED Provider Notes (Addendum)
[facility] Emergency Department Provider Note  ____________________________________________  Time seen: 2050  I have reviewed the triage vital signs and the nursing notes.   HISTORY  Chief Complaint Back Pain   {HPI Natasha [name] is a 81 y.o. female who presents with some right lower posterior chest discomfort on the right side. Patient's currently under treatment for pneumonia was here last week and was started on oral antibiotic which was continued by her primary physician later in the week. She was brought here to the emergency department by her husband mainly because she had persistent pain and had been prescribed Percocet at home. Patient states she took her last Percocet at 1800 tonight and did not seem to offer any relief. She's had occasional persistent cough but denies any hemoptysis or wheezing. She was also concerned that the pneumonia may be getting worse. He denies any fever at home. Pain is worse with movement and deep inspiration. She denies any pulmonary emboli risk factors     Past Medical History  Diagnosis Date  . GERD (gastroesophageal reflux disease)   . Asthma   . Anemia   . Arthritis   . H/O blood clots 2014  . Gallstones 2015  . Hypertension     Patient Active Problem List   Diagnosis Date Noted  . Calculus of gallbladder with other cholecystitis, without mention of obstruction 04/20/2013    Past Surgical History  Procedure Laterality Date  . Abdominal hysterectomy  1960  . Appendectomy  1946  . Cochlear implant  2009  . Eye surgery  [phone]    cataract  . Stent placement  2006-2014    multiple stent placements in legs  . Colonoscopy  2015    Dr. Rayann Heman   . Cholecystectomy  04-24-13    Current Outpatient Rx  Name  Route  Sig  Dispense  Refill  . cilostazol (PLETAL) 100 MG tablet   Oral   Take 200 mg by mouth 2 (two) times daily.         Marland Kitchen gabapentin (NEURONTIN) 300 MG capsule   Oral   Take 300 mg by mouth  2 (two) times daily.         Marland Kitchen levofloxacin (LEVAQUIN) 500 MG tablet   Oral   Take 500 mg by mouth every morning.         . metolazone (ZAROXOLYN) 2.5 MG tablet   Oral   Take 2.5 mg by mouth 2 (two) times a week.         Marland Kitchen oxyCODONE-acetaminophen (PERCOCET/ROXICET) 5-325 MG per tablet   Oral   Take 1 tablet by mouth 2 (two) times daily.         Marland Kitchen albuterol (PROVENTIL HFA;VENTOLIN HFA) 108 (90 BASE) MCG/ACT inhaler   Inhalation   Inhale 1-2 puffs into the lungs 2 (two) times daily.         . Calcium-Magnesium-Vitamin D (CALCIUM 1200+D3 PO)   Oral   Take by mouth.         . diltiazem (TIAZAC) 180 MG 24 hr capsule   Oral   Take 1 capsule by mouth daily.         Marland Kitchen HYDROmorphone (DILAUDID) 2 MG tablet   Oral   Take 1 tablet (2 mg total) by mouth 3 (three) times daily as needed for severe pain.   20 tablet   0   . levofloxacin (LEVAQUIN) 750 MG tablet   Oral   Take 1 tablet (750 mg total) by  mouth daily. Patient not taking: Reported on 08/17/2014   5 tablet   0   . OXYGEN-HELIUM IN   Inhalation   Inhale into the lungs.           Allergies Codeine and Hydrocodone  Family History  Problem Relation Age of Onset  . Heart disease Mother   . Heart disease Father   . Cancer Sister     lung    Social History History  Substance Use Topics  . Smoking status: Former Research scientist (life sciences)  . Smokeless tobacco: Not on file  . Alcohol Use: No    Review of Systems  Constitutional: Negative for fever. Eyes: Negative for visual changes. ENT: Negative for sore throat Cardiovascular: Negative for chest pain. Respiratory: Negative for shortness of breath. Gastrointestinal: Negative for abdominal pain, vomiting and diarrhea. Genitourinary: Negative for dysuria. Musculoskeletal: Negative for back pain. Skin: Negative for rash. Neurological: Negative for headaches or focal weakness     ____________________________________________   PHYSICAL EXAM:  VITAL  SIGNS: ED Triage Vitals  Enc Vitals Group     BP 08/17/14 2045 107/54 mmHg     Pulse Rate 08/17/14 2045 71     Resp 08/17/14 2046 18     Temp 08/17/14 2046 99.1 F (37.3 C)     Temp src --      SpO2 08/17/14 2040 95 %     Weight 08/17/14 2046 134 lb (60.782 kg)     Height 08/17/14 2046 4\' 11"  (1.499 m)     Head Cir --      Peak Flow --      Pain Score 08/17/14 2048 7     Pain Loc --      Pain Edu? --      Excl. in [place]? --     Constitutional: Alert and oriented. Well appearing and in no distress. Eyes: Conjunctivae are normal.  ENT   Head: Normocephalic and atraumatic.   Mouth/Throat: Mucous membranes are moist. Cardiovascular: Normal rate, regular rhythm. Normal and symmetric distal pulses are present in all extremities. No murmurs, rubs, or gallops. Respiratory: Normal respiratory effort without tachypnea nor retractions. Breath sounds are clear and equal bilaterally.  Gastrointestinal: Soft and non-tender in all quadrants. No distention. There is no CVA tenderness. Genitourinary: deferred Musculoskeletal: Nontender with normal range of motion in all extremities. No lower extremity tenderness nor edema. Neurologic:  Normal speech and language. No gross focal neurologic deficits are appreciated. Back: Patient has a mild area of erythema with no crepitus or step-off noted and no obvious herpetic rash. She is point tender to palpation over the right T12 area posterior laterally of the right chest. Psychiatric: Mood and affect are normal. Patient exhibits appropriate insight and judgment.  ____________________________________________    LABS (pertinent positives/negatives)  Labs Reviewed  COMPREHENSIVE METABOLIC PANEL - Abnormal; Notable for the following:    Chloride 98 (*)    Glucose, Bld 103 (*)    BUN 28 (*)    Albumin 3.1 (*)    GFR calc non Af Amer 55 (*)    All other components within normal limits  CBC WITH DIFFERENTIAL/PLATELET - Abnormal; Notable for the  following:    RBC 3.73 (*)    Hemoglobin 9.6 (*)    HCT 29.6 (*)    MCV 79.2 (*)    MCH 25.7 (*)    RDW 19.7 (*)    All other components within normal limits    ____________________________________________  EKG: 20:43 Normal sinus rhythm with  a sinus arrhythmia Rate: 73 Intervals: normal Old septal infarct Axis: normal No ST/T wave abnormalities No acute abnormalities No ischemic changes  ____________________________________________    RADIOLOGY I have personally reviewed any xrays that were ordered on this patient: Chest x-ray shows no exacerbation of pneumonia. Please see radiology dictation.  ____________________________________________   _____________   INITIAL IMPRESSION / ASSESSMENT AND PLAN / ED COURSE  Pertinent labs & imaging results that were available during my care of the patient were reviewed by me and considered in my medical decision making (see chart for details).  ED course: Patient had an IV established and was given Dilaudid 0.5 mg IV and Zofran 4 mg IV for symptomatic relief. Her chest x-ray does not show any findings consistent with pneumonia at this time. Discussion at the bedside included a narrow differential to musculoskeletal chest wall pain from coughing and possibly residual discomfort from her pneumonia. So have early shingles though she states she did get the shingles vaccination. Patient appears to be alert understands discharged in improved and comfortable position. All questions and concerns were addressed at the bedside.  ____________________________________________   FINAL CLINICAL IMPRESSION(S) / ED DIAGNOSES  Final diagnoses:  Right-sided chest wall pain     Daymon Larsen, MD 08/17/14 2310  Daymon Larsen, MD 08/17/14 [phone]

## 2014-08-17 NOTE — ED Notes (Signed)
Pt arrived via EMS from [facility] with c/o right sided back/rib pain.  Pt recently diagnosed with Pneumonia last Sunday and currently on Levaquin.  She states the pneumonia is getting worse.  Cough is present but non-productive at this time.  Took oxycodone at 1800 tonight but is not helping.

## 2014-09-04 ENCOUNTER — Other Ambulatory Visit: Payer: Self-pay | Admitting: Internal Medicine

## 2014-09-04 ENCOUNTER — Ambulatory Visit
Admission: RE | Admit: 2014-09-04 | Discharge: 2014-09-04 | Disposition: A | Discharge status: Home / Self Care | Payer: Medicare Other | Source: Ambulatory Visit | Attending: Internal Medicine | Admitting: Internal Medicine

## 2014-09-04 DIAGNOSIS — K449 Diaphragmatic hernia without obstruction or gangrene: Secondary | ICD-10-CM | POA: Insufficient documentation

## 2014-09-04 DIAGNOSIS — I2510 Atherosclerotic heart disease of native coronary artery without angina pectoris: Secondary | ICD-10-CM | POA: Insufficient documentation

## 2014-09-04 DIAGNOSIS — M47814 Spondylosis without myelopathy or radiculopathy, thoracic region: Secondary | ICD-10-CM | POA: Insufficient documentation

## 2014-09-04 DIAGNOSIS — S22000A Wedge compression fracture of unspecified thoracic vertebra, initial encounter for closed fracture: Secondary | ICD-10-CM

## 2014-09-04 DIAGNOSIS — M546 Pain in thoracic spine: Secondary | ICD-10-CM | POA: Diagnosis present

## 2014-09-11 ENCOUNTER — Ambulatory Visit (INDEPENDENT_AMBULATORY_CARE_PROVIDER_SITE_OTHER): Payer: Medicare Other | Admitting: Podiatry

## 2014-09-11 ENCOUNTER — Encounter: Payer: Self-pay | Admitting: Podiatry

## 2014-09-11 VITALS — BP 156/79 | HR 86 | Resp 16

## 2014-09-11 DIAGNOSIS — L97529 Non-pressure chronic ulcer of other part of left foot with unspecified severity: Secondary | ICD-10-CM

## 2014-09-11 DIAGNOSIS — L89891 Pressure ulcer of other site, stage 1: Secondary | ICD-10-CM | POA: Diagnosis not present

## 2014-09-11 MED ORDER — CEPHALEXIN 500 MG PO CAPS: 500.0000 mg | ORAL_CAPSULE | Freq: Two times a day (BID) | ORAL | Status: DC

## 2014-09-11 NOTE — Progress Notes (Signed)
She presents today as she was sent over by Dr. Lucky Cowboy for superficial ulceration to the distal aspect of the hallux left. She states that he was bleeding yesterday. She states that she sees him for her venous insufficiency and a stent in her leg.  Objective: Vital signs are stable she's alert. She is very hard of hearing. No signs of infection to the left foot. Pulses are palpable. Superficial ulceration to the distal aspect of the toe measuring less than 3 mm in diameter there is no active purulence in it does not probe to bone.  Assessment: Superficial ulceration ischemic in nature left hallux.  Plan: Encouraged her to soak in Epsom salts and warm water and apply Bactroban ointment and covered daily. I will follow up with her in 1-2 weeks.

## 2014-09-18 ENCOUNTER — Other Ambulatory Visit: Payer: Self-pay | Admitting: Internal Medicine

## 2014-09-18 DIAGNOSIS — Z1231 Encounter for screening mammogram for malignant neoplasm of breast: Secondary | ICD-10-CM

## 2014-09-25 ENCOUNTER — Encounter: Payer: Self-pay | Admitting: Podiatry

## 2014-09-25 ENCOUNTER — Ambulatory Visit (INDEPENDENT_AMBULATORY_CARE_PROVIDER_SITE_OTHER): Payer: Medicare Other | Admitting: Podiatry

## 2014-09-25 VITALS — BP 129/63 | HR 90 | Resp 18

## 2014-09-25 DIAGNOSIS — Q828 Other specified congenital malformations of skin: Secondary | ICD-10-CM

## 2014-09-25 DIAGNOSIS — L97529 Non-pressure chronic ulcer of other part of left foot with unspecified severity: Secondary | ICD-10-CM

## 2014-09-25 DIAGNOSIS — L89891 Pressure ulcer of other site, stage 1: Secondary | ICD-10-CM | POA: Diagnosis not present

## 2014-09-25 NOTE — Progress Notes (Signed)
She presents today for follow-up of superficial ulcer to the plantar aspect of the first metatarsophalangeal joint right foot as well as to the distal aspect of the hallux left. She also had a distal clavus to the second digit of the left foot. She states that her feet are hurting her badly and she can hardly walk. She cannot take care of her feet because she can no longer see nor can she bends down to take care of them.  Objective: Vital signs are stable she is alert and oriented 3. Pulses are palpable. The superficial ulceration sub-first metatarsophalangeal joint of the hallux has healed. She has a superficial ulceration to the distal aspect of the hallux left. Which I debrided to bleeding today but does not demonstrate any signs of bacterial infection. She also had a distal clavus to the distal aspect of the second digit which upon removing does not demonstrate anything other than fresh clean skin. Currently I see no infection and the only open wound is to the hallux left.  Assessment: Well-healing ulcerative lesions bilateral. Metatarsalgia capsulitis first metatarsophalangeal joint right foot.  Plan: I debrided all lesions today to the best of my ability. Dressed the hallux left. Also provided her with 2 silicone metatarsal pads for the right foot. These are metatarsal pads that he alleviated her symptoms immediately. I will follow-up with her in a few weeks just to reevaluate.

## 2014-10-23 ENCOUNTER — Encounter: Payer: Self-pay | Admitting: Podiatry

## 2014-10-23 ENCOUNTER — Ambulatory Visit (INDEPENDENT_AMBULATORY_CARE_PROVIDER_SITE_OTHER): Payer: Medicare Other | Admitting: Podiatry

## 2014-10-23 DIAGNOSIS — M79672 Pain in left foot: Secondary | ICD-10-CM

## 2014-10-23 DIAGNOSIS — Q828 Other specified congenital malformations of skin: Secondary | ICD-10-CM | POA: Diagnosis not present

## 2014-10-23 DIAGNOSIS — B351 Tinea unguium: Secondary | ICD-10-CM | POA: Diagnosis not present

## 2014-10-23 DIAGNOSIS — M2042 Other hammer toe(s) (acquired), left foot: Secondary | ICD-10-CM

## 2014-10-23 DIAGNOSIS — L97529 Non-pressure chronic ulcer of other part of left foot with unspecified severity: Secondary | ICD-10-CM

## 2014-10-23 NOTE — Progress Notes (Signed)
She presents today for follow-up of ulceration sub-first metatarsophalangeal joint right as well as the distal hallux left and the distal third digit left. She states that they seem to still be sore.  Objective: Vital signs are stable alert and oriented 3. Pulses are palpable. Distal clavus first and third left once removed demonstrates no open wounds no cellulitis no drainage no odor debridement of the sub-first metatarsophalangeal joint also demonstrates no open wounds no erythema and no cellulitis drainage or odor. Pain in limb secondary to onychomycosis.  Assessment: Well healing ulcerations bilateral. Pain in limb secondary to onychomycosis.  Plan: Debridement of all reactive hyperkeratosis and debridement of nails 1 through 5 bilateral. Follow up with her in 1 month

## 2014-11-04 ENCOUNTER — Ambulatory Visit
Admission: RE | Admit: 2014-11-04 | Discharge: 2014-11-04 | Disposition: A | Discharge status: Home / Self Care | Payer: Medicare Other | Source: Ambulatory Visit | Attending: Internal Medicine | Admitting: Internal Medicine

## 2014-11-04 ENCOUNTER — Other Ambulatory Visit: Payer: Self-pay | Admitting: Internal Medicine

## 2014-11-04 DIAGNOSIS — Z1231 Encounter for screening mammogram for malignant neoplasm of breast: Secondary | ICD-10-CM | POA: Insufficient documentation

## 2014-11-04 DIAGNOSIS — R922 Inconclusive mammogram: Secondary | ICD-10-CM | POA: Diagnosis not present

## 2014-11-05 ENCOUNTER — Other Ambulatory Visit: Payer: Self-pay | Admitting: Internal Medicine

## 2014-11-05 DIAGNOSIS — R928 Other abnormal and inconclusive findings on diagnostic imaging of breast: Secondary | ICD-10-CM

## 2014-11-12 ENCOUNTER — Ambulatory Visit
Admission: RE | Admit: 2014-11-12 | Discharge: 2014-11-12 | Disposition: A | Discharge status: Home / Self Care | Payer: Medicare Other | Source: Ambulatory Visit | Attending: Internal Medicine | Admitting: Internal Medicine

## 2014-11-12 DIAGNOSIS — R928 Other abnormal and inconclusive findings on diagnostic imaging of breast: Secondary | ICD-10-CM | POA: Insufficient documentation

## 2014-11-13 ENCOUNTER — Encounter: Payer: Self-pay | Admitting: Podiatry

## 2014-11-13 ENCOUNTER — Ambulatory Visit (INDEPENDENT_AMBULATORY_CARE_PROVIDER_SITE_OTHER): Payer: Medicare Other | Admitting: Podiatry

## 2014-11-13 ENCOUNTER — Ambulatory Visit: Payer: Medicare Other

## 2014-11-13 VITALS — BP 120/55 | HR 78 | Resp 18

## 2014-11-13 DIAGNOSIS — L97529 Non-pressure chronic ulcer of other part of left foot with unspecified severity: Secondary | ICD-10-CM

## 2014-11-13 DIAGNOSIS — L89891 Pressure ulcer of other site, stage 1: Secondary | ICD-10-CM | POA: Diagnosis not present

## 2014-11-13 DIAGNOSIS — M79606 Pain in leg, unspecified: Secondary | ICD-10-CM

## 2014-11-13 NOTE — Progress Notes (Signed)
She presents today for follow-up of her ulcerations to the hallux left third digit left and plantar aspect of the first metatarsophalangeal joint right foot. At this point she says they've been doing pretty well.  Objective: Vital signs are stable she is alert and 3 pulses are palpable. For the most part these wounds appear to have gone to heal uneventfully. She continues to wear her buttress pad beneath her toes on her left foot.  Assessment: Ulceration plantar aspect first metatarsophalangeal joint right foot distal aspect hallux and third digit left foot.  Plan: Debrided these lesions today to bleeding place silver nitrate and dry sterile compressive dressing encouraged her to follow up with me in one more month.  Roselind Messier DPM

## 2014-12-16 ENCOUNTER — Encounter: Payer: Self-pay | Admitting: Podiatry

## 2014-12-16 ENCOUNTER — Ambulatory Visit (INDEPENDENT_AMBULATORY_CARE_PROVIDER_SITE_OTHER): Payer: Medicare Other | Admitting: Podiatry

## 2014-12-16 DIAGNOSIS — M2040 Other hammer toe(s) (acquired), unspecified foot: Secondary | ICD-10-CM

## 2014-12-16 DIAGNOSIS — Q828 Other specified congenital malformations of skin: Secondary | ICD-10-CM | POA: Diagnosis not present

## 2014-12-16 DIAGNOSIS — L89891 Pressure ulcer of other site, stage 1: Secondary | ICD-10-CM | POA: Diagnosis not present

## 2014-12-16 DIAGNOSIS — L97529 Non-pressure chronic ulcer of other part of left foot with unspecified severity: Secondary | ICD-10-CM

## 2014-12-16 NOTE — Progress Notes (Signed)
She presents today for follow-up of ulceration sub-first metatarsophalangeal joint of the right foot and multiple distal clavi first and second digit of the left foot. She states that my toes hurt all the time.  Objective: Vital signs are stable she is alert and oriented 3. Pulses remain barely palpable bilaterally. Distal clavus to the first and second digits of the right foot consistent with shoe gear abnormalities. Debrided these lesions today non-bleeding and non-complicated. Sub-first metatarsophalangeal joint of the right foot does demonstrate a reactive hyperkeratosis was debrided today does demonstrate superficial ulceration which does not demonstrate any signs of infection.  Assessment: Superficial ulceration sub-first right non-complicated. Porokeratosis with hammertoe deformities hallux and second digit left foot.  Plan: We discussed appropriate shoe gear today when evaluating the shoes is easy to see that she has shoe gear that is too short for her causing her toes to be cramping in her shoes and irritating resulting in ulcerations and sores. She will continue all conservative therapies including dressing of the toes and off weightbearing pads and I will follow-up with her in 1 month. She is to find a pair shoes between now and that time that are longer deeper and wider.

## 2014-12-30 ENCOUNTER — Ambulatory Visit (INDEPENDENT_AMBULATORY_CARE_PROVIDER_SITE_OTHER): Payer: Medicare Other | Admitting: Podiatry

## 2014-12-30 ENCOUNTER — Encounter: Payer: Self-pay | Admitting: Podiatry

## 2014-12-30 DIAGNOSIS — M2040 Other hammer toe(s) (acquired), unspecified foot: Secondary | ICD-10-CM

## 2014-12-30 DIAGNOSIS — Q828 Other specified congenital malformations of skin: Secondary | ICD-10-CM

## 2014-12-30 NOTE — Progress Notes (Signed)
She presents today for follow-up of the ulceration sub-first metatarsophalangeal joint of the right foot. She states that every thing else seems to be doing much better since I got bigger shoes.  Objective: Vital signs are stable she is alert and oriented 3 ulceration sub-first metatarsophalangeal joint is actually closed appears to be healing well. She really just does not have a whole lot of subcutaneous fat for padding here. This is beneath the tibial sesamoid and is painful. All of the elbow ulcerations distal aspect of the toes have gone on to heal uneventfully.  Assessment: Well-healing ulcerations and distal clavi.  Plan: I encouraged her to continue all conservative therapies and I will follow-up with her in 3 weeks.

## 2015-01-22 ENCOUNTER — Encounter: Payer: Self-pay | Admitting: Podiatry

## 2015-01-22 ENCOUNTER — Ambulatory Visit (INDEPENDENT_AMBULATORY_CARE_PROVIDER_SITE_OTHER): Payer: Medicare Other | Admitting: Podiatry

## 2015-01-22 DIAGNOSIS — Q828 Other specified congenital malformations of skin: Secondary | ICD-10-CM

## 2015-01-22 DIAGNOSIS — M79676 Pain in unspecified toe(s): Secondary | ICD-10-CM | POA: Diagnosis not present

## 2015-01-22 DIAGNOSIS — B351 Tinea unguium: Secondary | ICD-10-CM | POA: Diagnosis not present

## 2015-01-22 NOTE — Progress Notes (Signed)
She presents today with chief complaint of painful elongated toenails and painful porokeratosis and distal clavi.  Objective: Vital signs are stable she is alert and oriented 3. Pulses are strongly palpable. Porokeratosis are present bilateral toenails are thick yellow dystrophic onychomycotic and painful palpation.  Assessment: Well-healing ulceration third digit left foot. Porokeratosis multiple in nature plantar aspect of the forefoot and distal clavi.  Plan: Debridement of nails and porokeratosis thickness length bilateral. Follow up with me in 1 month dispensed crest pads.

## 2015-02-26 ENCOUNTER — Ambulatory Visit (INDEPENDENT_AMBULATORY_CARE_PROVIDER_SITE_OTHER): Payer: Medicare Other | Admitting: Podiatry

## 2015-02-26 ENCOUNTER — Encounter: Payer: Self-pay | Admitting: Podiatry

## 2015-02-26 DIAGNOSIS — Q828 Other specified congenital malformations of skin: Secondary | ICD-10-CM

## 2015-02-26 NOTE — Progress Notes (Signed)
She presents today with a distal clavus to the third digit of the left foot and a callus sub-first metatarsophalangeal joint of the right foot. She states that they hurt so badly even with padding.  Objective: Vital signs are stable she is alert and oriented 3. Pulses are palpable. No open lesions are noted today simply reactive hyperkeratosis which were debrided without iatrogenic lesions.  Assessment: Hammertoe deformity with a distal clavus third digit left foot. Sub-first metatarsophalangeal joint reactive hyperkeratosis.  Plan: Debrided all reactive hyperkeratosis for her.

## 2015-02-27 ENCOUNTER — Encounter
Admission: AD | Disposition: A | Discharge status: Skilled Nursing Facility | Payer: Self-pay | Source: Ambulatory Visit | Attending: Vascular Surgery

## 2015-02-27 ENCOUNTER — Inpatient Hospital Stay
Admission: AD | Admit: 2015-02-27 | Discharge: 2015-03-03 | DRG: 270 | Disposition: A | Discharge status: Skilled Nursing Facility | Payer: Medicare Other | Source: Ambulatory Visit | Attending: Vascular Surgery | Admitting: Vascular Surgery

## 2015-02-27 ENCOUNTER — Other Ambulatory Visit: Payer: Self-pay | Admitting: Vascular Surgery

## 2015-02-27 ENCOUNTER — Encounter: Payer: Self-pay | Admitting: *Deleted

## 2015-02-27 DIAGNOSIS — Z9049 Acquired absence of other specified parts of digestive tract: Secondary | ICD-10-CM | POA: Diagnosis not present

## 2015-02-27 DIAGNOSIS — J189 Pneumonia, unspecified organism: Secondary | ICD-10-CM

## 2015-02-27 DIAGNOSIS — Z9981 Dependence on supplemental oxygen: Secondary | ICD-10-CM

## 2015-02-27 DIAGNOSIS — Z888 Allergy status to other drugs, medicaments and biological substances status: Secondary | ICD-10-CM

## 2015-02-27 DIAGNOSIS — I890 Lymphedema, not elsewhere classified: Secondary | ICD-10-CM | POA: Diagnosis present

## 2015-02-27 DIAGNOSIS — G629 Polyneuropathy, unspecified: Secondary | ICD-10-CM | POA: Diagnosis present

## 2015-02-27 DIAGNOSIS — I739 Peripheral vascular disease, unspecified: Secondary | ICD-10-CM | POA: Diagnosis present

## 2015-02-27 DIAGNOSIS — T82858A Stenosis of vascular prosthetic devices, implants and grafts, initial encounter: Principal | ICD-10-CM | POA: Diagnosis present

## 2015-02-27 DIAGNOSIS — K219 Gastro-esophageal reflux disease without esophagitis: Secondary | ICD-10-CM | POA: Diagnosis present

## 2015-02-27 DIAGNOSIS — Z8249 Family history of ischemic heart disease and other diseases of the circulatory system: Secondary | ICD-10-CM | POA: Diagnosis not present

## 2015-02-27 DIAGNOSIS — J440 Chronic obstructive pulmonary disease with acute lower respiratory infection: Secondary | ICD-10-CM | POA: Diagnosis present

## 2015-02-27 DIAGNOSIS — I120 Hypertensive chronic kidney disease with stage 5 chronic kidney disease or end stage renal disease: Secondary | ICD-10-CM | POA: Diagnosis present

## 2015-02-27 DIAGNOSIS — G2581 Restless legs syndrome: Secondary | ICD-10-CM | POA: Diagnosis present

## 2015-02-27 DIAGNOSIS — J45909 Unspecified asthma, uncomplicated: Secondary | ICD-10-CM | POA: Diagnosis present

## 2015-02-27 DIAGNOSIS — Z886 Allergy status to analgesic agent status: Secondary | ICD-10-CM

## 2015-02-27 DIAGNOSIS — Z87891 Personal history of nicotine dependence: Secondary | ICD-10-CM | POA: Diagnosis not present

## 2015-02-27 DIAGNOSIS — R064 Hyperventilation: Secondary | ICD-10-CM

## 2015-02-27 DIAGNOSIS — E8770 Fluid overload, unspecified: Secondary | ICD-10-CM | POA: Diagnosis present

## 2015-02-27 DIAGNOSIS — K5900 Constipation, unspecified: Secondary | ICD-10-CM | POA: Diagnosis present

## 2015-02-27 DIAGNOSIS — N186 End stage renal disease: Secondary | ICD-10-CM | POA: Diagnosis present

## 2015-02-27 DIAGNOSIS — I998 Other disorder of circulatory system: Secondary | ICD-10-CM | POA: Diagnosis present

## 2015-02-27 DIAGNOSIS — Z803 Family history of malignant neoplasm of breast: Secondary | ICD-10-CM

## 2015-02-27 DIAGNOSIS — M1990 Unspecified osteoarthritis, unspecified site: Secondary | ICD-10-CM | POA: Diagnosis present

## 2015-02-27 DIAGNOSIS — D62 Acute posthemorrhagic anemia: Secondary | ICD-10-CM | POA: Diagnosis present

## 2015-02-27 DIAGNOSIS — Z9849 Cataract extraction status, unspecified eye: Secondary | ICD-10-CM

## 2015-02-27 DIAGNOSIS — I75029 Atheroembolism of unspecified lower extremity: Secondary | ICD-10-CM | POA: Diagnosis present

## 2015-02-27 DIAGNOSIS — Z90710 Acquired absence of both cervix and uterus: Secondary | ICD-10-CM

## 2015-02-27 HISTORY — PX: PERIPHERAL VASCULAR CATHETERIZATION: SHX172C

## 2015-02-27 LAB — BASIC METABOLIC PANEL
ANION GAP: 8 (ref 5–15)
BUN: 21 mg/dL — ABNORMAL HIGH (ref 6–20)
CHLORIDE: 97 mmol/L — AB (ref 101–111)
CO2: 34 mmol/L — ABNORMAL HIGH (ref 22–32)
Calcium: 10 mg/dL (ref 8.9–10.3)
Creatinine, Ser: 0.77 mg/dL (ref 0.44–1.00)
Glucose, Bld: 101 mg/dL — ABNORMAL HIGH (ref 65–99)
POTASSIUM: 3.6 mmol/L (ref 3.5–5.1)
SODIUM: 139 mmol/L (ref 135–145)

## 2015-02-27 LAB — CBC
HCT: 32 % — ABNORMAL LOW (ref 35.0–47.0)
HEMATOCRIT: 35.3 % (ref 35.0–47.0)
Hemoglobin: 11.3 g/dL — ABNORMAL LOW (ref 12.0–16.0)
Hemoglobin: 10.2 g/dL — ABNORMAL LOW (ref 12.0–16.0)
MCH: 26.5 pg (ref 26.0–34.0)
MCH: 26.4 pg (ref 26.0–34.0)
MCHC: 32.1 g/dL (ref 32.0–36.0)
MCHC: 31.8 g/dL — AB (ref 32.0–36.0)
MCV: 82.4 fL (ref 80.0–100.0)
MCV: 83 fL (ref 80.0–100.0)
PLATELETS: 228 10*3/uL (ref 150–440)
Platelets: 302 10*3/uL (ref 150–440)
RBC: 4.29 MIL/uL (ref 3.80–5.20)
RBC: 3.86 MIL/uL (ref 3.80–5.20)
RDW: 17.3 % — AB (ref 11.5–14.5)
RDW: 17.7 % — AB (ref 11.5–14.5)
WBC: 7.3 10*3/uL (ref 3.6–11.0)
WBC: 7.6 10*3/uL (ref 3.6–11.0)

## 2015-02-27 LAB — PROTIME-INR
INR: 1.96
PROTHROMBIN TIME: 22.2 s — AB (ref 11.4–15.0)

## 2015-02-27 LAB — ABO/RH: ABO/RH(D): A POS

## 2015-02-27 LAB — APTT: APTT: 36 s (ref 24–36)

## 2015-02-27 LAB — PHOSPHORUS: PHOSPHORUS: 4.1 mg/dL (ref 2.5–4.6)

## 2015-02-27 LAB — TYPE AND SCREEN
ABO/RH(D): A POS
ANTIBODY SCREEN: NEGATIVE

## 2015-02-27 LAB — MRSA PCR SCREENING: MRSA by PCR: NEGATIVE

## 2015-02-27 LAB — MAGNESIUM: Magnesium: 1.6 mg/dL — ABNORMAL LOW (ref 1.7–2.4)

## 2015-02-27 LAB — GLUCOSE, CAPILLARY: Glucose-Capillary: 79 mg/dL (ref 65–99)

## 2015-02-27 LAB — FIBRINOGEN: FIBRINOGEN: 330 mg/dL (ref 210–470)

## 2015-02-27 SURGERY — ABDOMINAL AORTOGRAM W/LOWER EXTREMITY
Wound class: Clean

## 2015-02-27 MED ORDER — ALBUTEROL SULFATE (2.5 MG/3ML) 0.083% IN NEBU
2.5000 mg | INHALATION_SOLUTION | Freq: Two times a day (BID) | RESPIRATORY_TRACT | Status: DC | PRN
  Administered 2015-02-28: 2.5 mg via RESPIRATORY_TRACT
  Filled 2015-02-27: qty 3

## 2015-02-27 MED ORDER — ACETAMINOPHEN 325 MG PO TABS
650.0000 mg | ORAL_TABLET | Freq: Four times a day (QID) | ORAL | Status: DC | PRN
  Administered 2015-02-28 – 2015-03-02 (×3): 650 mg via ORAL
  Filled 2015-02-27 (×4): qty 2

## 2015-02-27 MED ORDER — HYDROMORPHONE HCL 1 MG/ML IJ SOLN
INTRAMUSCULAR | Status: AC
  Filled 2015-02-27: qty 1

## 2015-02-27 MED ORDER — OXYCODONE HCL 5 MG PO TABS: 5.0000 mg | ORAL_TABLET | Freq: Four times a day (QID) | ORAL | Status: DC | PRN

## 2015-02-27 MED ORDER — HYDROMORPHONE HCL 2 MG PO TABS
2.0000 mg | ORAL_TABLET | Freq: Three times a day (TID) | ORAL | Status: DC | PRN
  Administered 2015-02-27 – 2015-03-01 (×4): 2 mg via ORAL
  Filled 2015-02-27 (×5): qty 1

## 2015-02-27 MED ORDER — ESOMEPRAZOLE MAGNESIUM 10 MG PO PACK: 10.0000 mg | PACK | Freq: Every day | ORAL | Status: DC

## 2015-02-27 MED ORDER — HEPARIN SODIUM (PORCINE) 1000 UNIT/ML IJ SOLN
INTRAMUSCULAR | Status: AC
  Filled 2015-02-27: qty 1

## 2015-02-27 MED ORDER — MIDAZOLAM HCL 5 MG/ML IJ SOLN
INTRAMUSCULAR | Status: DC | PRN
  Administered 2015-02-27: 2 mg via INTRAVENOUS

## 2015-02-27 MED ORDER — HEPARIN SODIUM (PORCINE) 1000 UNIT/ML IJ SOLN
INTRAMUSCULAR | Status: DC | PRN
  Administered 2015-02-27: 3000 [IU] via INTRAVENOUS

## 2015-02-27 MED ORDER — ONDANSETRON HCL 4 MG PO TABS: 4.0000 mg | ORAL_TABLET | Freq: Four times a day (QID) | ORAL | Status: DC | PRN

## 2015-02-27 MED ORDER — SODIUM CHLORIDE 0.9 % IV SOLN
INTRAVENOUS | Status: DC
  Administered 2015-02-27 – 2015-02-28 (×2): via INTRAVENOUS
  Filled 2015-02-27 (×4): qty 50

## 2015-02-27 MED ORDER — MIDAZOLAM HCL 5 MG/5ML IJ SOLN
INTRAMUSCULAR | Status: AC
  Filled 2015-02-27: qty 5

## 2015-02-27 MED ORDER — FENTANYL CITRATE (PF) 100 MCG/2ML IJ SOLN
INTRAMUSCULAR | Status: DC | PRN
  Administered 2015-02-27 (×3): 50 ug via INTRAVENOUS

## 2015-02-27 MED ORDER — MIDAZOLAM HCL 2 MG/2ML IJ SOLN
INTRAMUSCULAR | Status: DC | PRN
  Administered 2015-02-27: 1 mg via INTRAVENOUS
  Administered 2015-02-27: 2 mg via INTRAVENOUS
  Administered 2015-02-27: 1 mg via INTRAVENOUS

## 2015-02-27 MED ORDER — IOHEXOL 300 MG/ML  SOLN
INTRAMUSCULAR | Status: DC | PRN
Start: 2015-02-27 — End: 2015-02-27
  Administered 2015-02-27: 40 mL via INTRA_ARTERIAL

## 2015-02-27 MED ORDER — HYDROMORPHONE HCL 1 MG/ML IJ SOLN
1.0000 mg | INTRAMUSCULAR | Status: DC | PRN
  Administered 2015-02-27 – 2015-02-28 (×3): 1 mg via INTRAVENOUS
  Filled 2015-02-27 (×2): qty 1

## 2015-02-27 MED ORDER — LIDOCAINE-EPINEPHRINE (PF) 1 %-1:200000 IJ SOLN
INTRAMUSCULAR | Status: AC
  Filled 2015-02-27: qty 30

## 2015-02-27 MED ORDER — SODIUM CHLORIDE 0.9 % IV SOLN
INTRAVENOUS | Status: DC
  Administered 2015-02-27 – 2015-02-28 (×2): via INTRAVENOUS

## 2015-02-27 MED ORDER — SODIUM CHLORIDE 0.9 % IV SOLN: INTRAVENOUS | Status: DC

## 2015-02-27 MED ORDER — PANTOPRAZOLE SODIUM 40 MG PO PACK
40.0000 mg | PACK | Freq: Every day | ORAL | Status: DC
  Administered 2015-03-01: 40 mg via ORAL
  Filled 2015-02-27: qty 20

## 2015-02-27 MED ORDER — ALUM & MAG HYDROXIDE-SIMETH 200-200-20 MG/5ML PO SUSP
30.0000 mL | Freq: Four times a day (QID) | ORAL | Status: DC | PRN
  Administered 2015-02-28: 30 mL via ORAL
  Filled 2015-02-27: qty 30

## 2015-02-27 MED ORDER — OXYCODONE HCL 5 MG PO TABS: 10.0000 mg | ORAL_TABLET | Freq: Four times a day (QID) | ORAL | Status: DC | PRN

## 2015-02-27 MED ORDER — METOLAZONE 2.5 MG PO TABS
2.5000 mg | ORAL_TABLET | ORAL | Status: DC
  Administered 2015-03-03: 2.5 mg via ORAL
  Filled 2015-02-27: qty 1

## 2015-02-27 MED ORDER — ALTEPLASE 2 MG IJ SOLR
INTRAMUSCULAR | Status: AC
  Filled 2015-02-27: qty 4

## 2015-02-27 MED ORDER — ONDANSETRON HCL 4 MG/2ML IJ SOLN
4.0000 mg | Freq: Four times a day (QID) | INTRAMUSCULAR | Status: DC | PRN
  Administered 2015-02-28: 4 mg via INTRAVENOUS
  Filled 2015-02-27: qty 2

## 2015-02-27 MED ORDER — DILTIAZEM HCL ER BEADS 180 MG PO CP24
180.0000 mg | ORAL_CAPSULE | Freq: Every day | ORAL | Status: DC
  Filled 2015-02-27 (×5): qty 1

## 2015-02-27 MED ORDER — HEPARIN (PORCINE) IN NACL 100-0.45 UNIT/ML-% IJ SOLN
500.0000 [IU]/h | INTRAMUSCULAR | Status: DC
  Administered 2015-02-27: 500 [IU]/h via INTRAVENOUS
  Filled 2015-02-27: qty 250

## 2015-02-27 MED ORDER — HEPARIN (PORCINE) IN NACL 100-0.45 UNIT/ML-% IJ SOLN
500.0000 [IU]/h | INTRAMUSCULAR | Status: DC
  Filled 2015-02-27 (×2): qty 250

## 2015-02-27 MED ORDER — HEPARIN (PORCINE) IN NACL 100-0.45 UNIT/ML-% IJ SOLN: 12.0000 [IU]/kg/h | INTRAMUSCULAR | Status: DC

## 2015-02-27 MED ORDER — ALBUTEROL SULFATE (2.5 MG/3ML) 0.083% IN NEBU: 2.5000 mg | INHALATION_SOLUTION | Freq: Two times a day (BID) | RESPIRATORY_TRACT | Status: DC

## 2015-02-27 MED ORDER — HEPARIN (PORCINE) IN NACL 2-0.9 UNIT/ML-% IJ SOLN
INTRAMUSCULAR | Status: AC
  Filled 2015-02-27: qty 1000

## 2015-02-27 MED ORDER — FENTANYL CITRATE (PF) 100 MCG/2ML IJ SOLN
INTRAMUSCULAR | Status: AC
  Filled 2015-02-27: qty 2

## 2015-02-27 MED ORDER — GABAPENTIN 300 MG PO CAPS
300.0000 mg | ORAL_CAPSULE | Freq: Two times a day (BID) | ORAL | Status: DC
  Administered 2015-02-27 – 2015-03-03 (×7): 300 mg via ORAL
  Filled 2015-02-27 (×7): qty 1

## 2015-02-27 MED ORDER — ALBUTEROL SULFATE HFA 108 (90 BASE) MCG/ACT IN AERS: 1.0000 | INHALATION_SPRAY | Freq: Two times a day (BID) | RESPIRATORY_TRACT | Status: DC

## 2015-02-27 MED ORDER — CHLORHEXIDINE GLUCONATE CLOTH 2 % EX PADS
6.0000 | MEDICATED_PAD | Freq: Once | CUTANEOUS | Status: AC
  Administered 2015-02-27: 6 via TOPICAL

## 2015-02-27 MED ORDER — ACETAMINOPHEN 650 MG RE SUPP: 650.0000 mg | Freq: Four times a day (QID) | RECTAL | Status: DC | PRN

## 2015-02-27 SURGICAL SUPPLY — 23 items
BALLN ULTRVRSE 3X100X130C (BALLOONS) ×5
BALLOON ULTRVRSE 3X100X130C (BALLOONS) ×1 IMPLANT
CANISTER PENUMBRA MAX (MISCELLANEOUS) ×3 IMPLANT
CANNULA 5F STIFF (CANNULA) ×5 IMPLANT
CATH GWIRE MARINER STRGHT 4FR (CATHETERS) ×3 IMPLANT
CATH INDIGO 6 ST-TIP 135CM (CATHETERS) ×3 IMPLANT
CATH INFUS 90CMX50CM (CATHETERS) ×2
CATH INFUS UNIFUSE 90X50 5FR (CATHETERS) ×1 IMPLANT
CATH PIG 70CM (CATHETERS) ×5 IMPLANT
CATH VERT 100CM (CATHETERS) ×5 IMPLANT
DEVICE PRESTO INFLATION (MISCELLANEOUS) ×3 IMPLANT
GLIDEWIRE ADV .035X260CM (WIRE) ×5 IMPLANT
KIT CATH CVC 3 LUMEN 7FR 8IN (MISCELLANEOUS) ×3 IMPLANT
PACK ANGIOGRAPHY (CUSTOM PROCEDURE TRAY) ×5 IMPLANT
SHEATH ANL2 6FRX45 HC (SHEATH) ×5 IMPLANT
SHEATH BRITE TIP 5FRX11 (SHEATH) ×5 IMPLANT
SHEATH BRITE TIP 7FRX5.5 (SHEATH) ×3 IMPLANT
SUT PROLENE 0 CT 1 30 (SUTURE) ×3 IMPLANT
SYR MEDRAD MARK V 150ML (SYRINGE) ×5 IMPLANT
TUBING ASPIRATION INDIGO (MISCELLANEOUS) ×3 IMPLANT
TUBING CONTRAST HIGH PRESS 72 (TUBING) ×5 IMPLANT
WIRE G V18X300CM (WIRE) ×3 IMPLANT
WIRE J 3MM .035X145CM (WIRE) ×5 IMPLANT

## 2015-02-27 NOTE — Op Note (Signed)
[facility]   PROCEDURE NOTE  PROCEDURE: 1. Right IJ triple lumen central venous catheter placement 2. Right IJ cannulation under ultrasound guidance  PRE-OPERATIVE DIAGNOSIS: ischemic leg  POST-OPERATIVE DIAGNOSIS: same as above  SURGEON: Brissia Delisa, MD  ANESTHESIA:  None  ESTIMATED BLOOD LOSS: minimal  FINDING(S): none  SPECIMEN(S):  none  INDICATIONS:   Natasha [name] is a 82 y.o. female who presents with need for venous access.  The patient presents for central venous catheter placement.  The patient is aware the risks of central venous catheter placement include but are not limited to: bleeding, infection, central venous injury, pneumothorax, possible venous stenosis, possible malpositioning in the venous system, and possible infections related to long-term catheter presence. The patient was aware of these risks and agreed to proceed.  DESCRIPTION: After written informed consent was obtained from the patient and/or family, the patient was placed supine in the hospital bed.  The patient was prepped with chloraprep and draped in the standard fashion for a chest or neck central venous catheter placement.  I anesthesized the neck cannulation site with 1% lidocaine then under ultrasound guidance, the right internal jugular  vein was cannulated with the 18 gauge needle.  A J wire was then placed down in the superior vena cava.  After a skin nick and dilatation, the triple lumen central venous catheter was placed over the wire and the wire was removed.  Each port was aspirated and flushed with sterile normal saline.  The catheter was secured in placed with three interrupted stitches of 3-0 Silk tied to the catheter.  The catheter was dressed with sterile dressing.  Stat CXR is pending  COMPLICATIONS: none apparent  CONDITION: stable  Natasha [name] 02/27/2015, 6:12 PM

## 2015-02-27 NOTE — Progress Notes (Signed)
eLink Physician-Brief Progress Note Patient Name: Natasha [name] DOB: November 22, 1928 MRN: [id number]   Date of Service  02/27/2015  HPI/Events of Note  Thrombectomy with overnight thrombolysis Stable on camera check  eICU Interventions  No eICU intervention     Intervention Category Evaluation Type: New Patient Evaluation  Simonne Maffucci 02/27/2015, 9:33 PM

## 2015-02-27 NOTE — Op Note (Signed)
[facility] Percutaneous Study/Intervention Procedural Note   Date of Surgery: 02/27/2015  Surgeon(s):Annelle Behrendt   Assistants:none  Pre-operative Diagnosis: PAD with rest pain left lower extremity with acute on chronic ischemia and occlusion of her femoral to distal bypass  Post-operative diagnosis: Same  Procedure(s) Performed: 1. Ultrasound guidance for vascular access right femoral artery 2. Catheter placement into left anterior tibial artery from right femoral approach 3. Aortogram and selective left lower extremity angiogram 4. Percutaneous transluminal angioplasty of distal bypass anastomosis and proximal anterior tibial artery with 3 mm diameter angioplasty balloon 5. Catheter directed thrombolysis with 4 mg of TPA delivered throughout the femoral to distal bypass graft and proximal anterior tibial artery  6.  Mechanical thrombectomy using the Penumbra CAT 6 device to the left femoral to distal bypass and proximal anterior tibial artery 7. placement of infusion catheter for overnight thrombolysis  EBL: 25 cc  Contrast: 40 cc  Fluro Time: 9.1 minutes  Moderate Conscious Sedation Time: approximately 45 minutes using 4 mg of Versed and 150 mcg of Fentanyl  Indications: Patient is a 82 y.o.female with acute on chronic ischemia of the left lower extremity with rest pain. The patient has noninvasive study showing occlusion of her femoral to distal bypass graft. The patient is brought in for angiography for further evaluation and potential treatment. Risks and benefits are discussed and informed consent is obtained  Procedure: The patient was identified and appropriate procedural time out was performed. The patient was then placed supine on the table and prepped and draped in the usual sterile fashion.Moderate conscious sedation was administered during a face to  face encounter with the patient throughout the procedure with my supervision of the RN administering medicines and monitoring the patient's vital signs, pulse oximetry, telemetry and mental status throughout from the start of the procedure until the patient was taken to the recovery room. Ultrasound was used to evaluate the right common femoral artery. It was patent . A digital ultrasound image was acquired. A Seldinger needle was used to access the right common femoral artery under direct ultrasound guidance and a permanent image was performed. A 0.035 J wire was advanced without resistance and a 5Fr sheath was placed. Pigtail catheter was placed into the aorta and an AP aortogram was performed. This demonstrated normal renal arteries and normal aorta and iliac segments without significant stenosis. I then crossed the aortic bifurcation and advanced to the left femoral head. Selective left lower extremity angiogram was then performed. This demonstrated occlusion of the femoral to distal bypass with what appeared to be moderate stenosis and some thrombus at the distal anastomosis and proximal anterior tibial artery. This was the only runoff distally. The patient was systemically heparinized and a 6 [place] Ansell sheath was then placed over the [employer] wire. I then used a Kumpe catheter and the advantage wire to navigate through the bypass graft and get a catheter into the anterior tibial artery which was the distal target and the only runoff distally. Imaging in the anterior tibial artery showed intraluminal flow. This also showed some stenosis and thrombus in the initial portions of the anterior tibial artery that appeared flow limiting. I then instilled 4 mg of TPA through a diagnostic catheter throughout the bypass graft and down into the proximal anterior tibial artery. This was allowed to dwell for approximately 15 minutes. I then performed mechanical thrombectomy with 3 passes using the Penumbra  CAT 6 catheter starting at the origin of the bypass graft in  the common femoral artery throughout the entire bypass graft and into the proximal anterior tibial artery beyond the bypass graft. To dress the distal bypass and proximal anterior tibial artery, a 3 mm diameter by 10 centimeter length angioplasty balloon was inflated to 10 atm for 1 minute at the distal anastomosis in the first 6 or 7 cm of the anterior tibial artery. This resulted in improvement but there was still thrombus throughout the graft. I then selected a 90 cm total length 50 cm working length lytic catheter that was parked with its distal tip just above the distal bypass anastomosis at the knee and its proximal tip just up into the sheath in the common femoral artery on the left. The sheath and the catheter was secured to the skin with Prolene sutures. The patient was then taken to the recovery room in stable condition having tolerated the procedure well.  Findings:  Aortogram: normal renal arteries, normal iliac arteries and aorta Left Lower Extremity: Occlusion of the femoral to distal bypass with what appeared to be moderate stenosis and some thrombus at the distal anastomosis and proximal anterior tibial artery. This was the only runoff distally.   Disposition: Patient was taken to the recovery room in stable condition having tolerated the procedure well.  Complications: None  Qamar Aughenbaugh 02/27/2015 6:13 PM

## 2015-02-27 NOTE — Progress Notes (Addendum)
ANTICOAGULATION CONSULT NOTE - Initial Consult  Pharmacy Consult for Heparin  Indication: lower extremity bypass occulsion, will treat as DVT  Allergies  Allergen Reactions  . Codeine Diarrhea  . Hydrocodone   . Levofloxacin In D5w Other (See Comments)    Pt denies, questionable  . Tramadol Nausea Only    Patient Measurements: Height: 4\' 11"  (149.9 cm) Weight: 139 lb (63.05 kg) IBW/kg (Calculated) : 43.2 Heparin Dosing Weight: 56.7 kg   Vital Signs: Temp: 98.2 F (36.8 C) (02/16 1616) BP: 157/82 mmHg (02/16 1616) Pulse Rate: 82 (02/16 1616)  Labs:  Recent Labs  02/27/15 1555  HGB 11.3*  HCT 35.3  PLT 302  APTT 36  LABPROT 22.2*  INR 1.96  CREATININE 0.77    Estimated Creatinine Clearance: 42.3 mL/min (by C-G formula based on Cr of 0.77).   Medical History: Past Medical History  Diagnosis Date  . GERD (gastroesophageal reflux disease)   . Asthma   . Anemia   . Arthritis   . H/O blood clots 2014  . Gallstones 2015  . Hypertension     Medications:  Prescriptions prior to admission  Medication Sig Dispense Refill Last Dose  . albuterol (PROVENTIL HFA;VENTOLIN HFA) 108 (90 BASE) MCG/ACT inhaler Inhale 1-2 puffs into the lungs 2 (two) times daily.   02/26/2015 at Unknown time  . Calcium-Magnesium-Vitamin D (CALCIUM 1200+D3 PO) Take by mouth.   02/27/2015 at Unknown time  . diltiazem (TIAZAC) 180 MG 24 hr capsule Take 1 capsule by mouth daily.   02/27/2015 at Unknown time  . esomeprazole (NEXIUM) 10 MG packet Take 10 mg by mouth daily before breakfast.   02/27/2015 at Unknown time  . gabapentin (NEURONTIN) 300 MG capsule Take 300 mg by mouth 2 (two) times daily.   02/27/2015 at Unknown time  . metolazone (ZAROXOLYN) 2.5 MG tablet Take 2.5 mg by mouth 2 (two) times a week.   02/27/2015 at Unknown time  . oxyCODONE-acetaminophen (PERCOCET/ROXICET) 5-325 MG per tablet Take 1 tablet by mouth 2 (two) times daily.   02/27/2015 at Unknown time  . OXYGEN-HELIUM IN Inhale  into the lungs.   02/26/2015 at Unknown time  . rivaroxaban (XARELTO) 20 MG TABS tablet Take 20 mg by mouth daily with supper.     . cilostazol (PLETAL) 100 MG tablet Take 200 mg by mouth 2 (two) times daily. Reported on 02/27/2015   Not Taking at Unknown time  . HYDROmorphone (DILAUDID) 2 MG tablet Take 1 tablet (2 mg total) by mouth 3 (three) times daily as needed for severe pain. (Patient not taking: Reported on 02/27/2015) 20 tablet 0 Not Taking at Unknown time    Assessment: Pharmacy consulted to dose heparin in this 82 year old female admitted with lower extremity occlusion.  Pt was on Xarelto 20 mg PO daily at home.  2/16 @ 16:00 :  INR = 1.96                          APTT = 36   Goal of Therapy:  Heparin level 0.3-0.7 units/ml aPTT 68 - 109  seconds Monitor platelets by anticoagulation protocol: Yes   Plan:  Start heparin infusion at 1000 units/hr.  Will not bolus this pt due to prior use of Xarelto.  Will order baseline HL.  Will use aPTT to guide dosing until HL and aPTT coincide.  Will check CBC daily.   Nathanel Tallman D 02/27/2015,5:25 PM

## 2015-02-27 NOTE — H&P (Signed)
[facility] Admission History & Physical  MRN : [id number]  Natasha [name] is a 82 y.o. ([birth year]) female who presents with chief complaint of No chief complaint on file. Marland Kitchen  History of Present Illness: Patient presents with a history of pain in her left leg that started today. She was unable to walk on that leg today. She and her husband described that her leg has been twitchy and Tingly for several days now, but not overtly painful. She has an extensive history of vascular disease and status post femoral to distal bypass some years ago. She has chronic swelling in that leg with lymphedema that has been reasonably stable. This pain was a new finding. There is no clear inciting event or causative factor. There is no trauma or injury. The patient denies any fevers or chills. A duplex performed in the clinic revealed occlusion of her bypass graft in the left lower extremity. She is admitted for an attempt at salvaging the bypass graft and she would have ischemia that would likely require an amputation without. She has what appears to be rest pain now.  Current Facility-Administered Medications  Medication Dose Route Frequency Provider Last Rate Last Dose  . 0.9 %  sodium chloride infusion   Intravenous Continuous Algernon Huxley, MD      . acetaminophen (TYLENOL) tablet 650 mg  650 mg Oral Q6H PRN Algernon Huxley, MD       Or  . acetaminophen (TYLENOL) suppository 650 mg  650 mg Rectal Q6H PRN Algernon Huxley, MD      . albuterol (PROVENTIL HFA;VENTOLIN HFA) 108 (90 Base) MCG/ACT inhaler 1-2 puff  1-2 puff Inhalation BID Kimberly A Stegmayer, PA-C      . alum & mag hydroxide-simeth (MAALOX/MYLANTA) 200-200-20 MG/5ML suspension 30 mL  30 mL Oral Q6H PRN Algernon Huxley, MD      . diltiazem [facility]) 24 hr capsule 180 mg  180 mg Oral Daily Kimberly A Stegmayer, PA-C      . Derrill Memo ON 02/28/2015] esomeprazole (NEXIUM) suspension 10 mg  10 mg Oral QAC breakfast Kimberly A Stegmayer, PA-C       . gabapentin (NEURONTIN) capsule 300 mg  300 mg Oral BID Kimberly A Stegmayer, PA-C      . heparin ADULT infusion 100 units/mL (25000 units/250 mL)  12 Units/kg/hr Intravenous Continuous Algernon Huxley, MD      . HYDROmorphone (DILAUDID) tablet 2 mg  2 mg Oral TID PRN Janalyn Harder Stegmayer, PA-C      . metolazone (ZAROXOLYN) tablet 2.5 mg  2.5 mg Oral Once per day on Mon Thu Kimberly A Stegmayer, PA-C      . ondansetron [facility]) tablet 4 mg  4 mg Oral Q6H PRN Algernon Huxley, MD       Or  . ondansetron (ZOFRAN) injection 4 mg  4 mg Intravenous Q6H PRN Algernon Huxley, MD        Past Medical History  Diagnosis Date  . GERD (gastroesophageal reflux disease)   . Asthma   . Anemia   . Arthritis   . H/O blood clots 2014  . Gallstones 2015  . Hypertension     Past Surgical History  Procedure Laterality Date  . Abdominal hysterectomy  1960  . Appendectomy  1946  . Cochlear implant  2009  . Eye surgery  [phone]    cataract  . Stent placement  2006-2014    multiple stent placements in legs  .  Colonoscopy  2015    Dr. Rayann Heman   . Cholecystectomy  04-24-13  . Breast biopsy Bilateral     negative    Social History Social History  Substance Use Topics  . Smoking status: Former Research scientist (life sciences)  . Smokeless tobacco: None  . Alcohol Use: No  married, lives with husband  Family History Family History  Problem Relation Age of Onset  . Heart disease Mother   . Heart disease Father   . Cancer Sister     lung  . Breast cancer Daughter     Allergies  Allergen Reactions  . Codeine Diarrhea  . Hydrocodone   . Levofloxacin In D5w Other (See Comments)    Pt denies, questionable  . Tramadol Nausea Only     REVIEW OF SYSTEMS (Negative unless checked)  Constitutional: [] Weight loss  [] Fever  [] Chills Cardiac: [] Chest pain   [] Chest pressure   [] Palpitations   [] Shortness of breath when laying flat   [] Shortness of breath at rest   [] Shortness of breath with exertion. Vascular:  [] Pain in legs  with walking   [x] Pain in legs at rest   [] Pain in legs when laying flat   [] Claudication   [] Pain in feet when walking  [] Pain in feet at rest  [] Pain in feet when laying flat   [] History of DVT   [] Phlebitis   [x] Swelling in legs   [] Varicose veins   [] Non-healing ulcers Pulmonary:   [] Uses home oxygen   [] Productive cough   [] Hemoptysis   [] Wheeze  [] COPD   [] Asthma Neurologic:  [] Dizziness  [] Blackouts   [] Seizures   [] History of stroke   [] History of TIA  [] Aphasia   [] Temporary blindness   [] Dysphagia   [] Weakness or numbness in arms   [] Weakness or numbness in legs Musculoskeletal:  [] Arthritis   [] Joint swelling   [] Joint pain   [] Low back pain Hematologic:  [] Easy bruising  [] Easy bleeding   [] Hypercoagulable state   [] Anemic  [] Hepatitis Gastrointestinal:  [] Blood in stool   [] Vomiting blood  [] Gastroesophageal reflux/heartburn   [] Difficulty swallowing. Genitourinary:  [] Chronic kidney disease   [] Difficult urination  [] Frequent urination  [] Burning with urination   [] Blood in urine Skin:  [] Rashes   [] Ulcers   [] Wounds Psychological:  [] History of anxiety   []  History of major depression.  Physical Examination  There were no vitals filed for this visit. There is no weight on file to calculate BMI. Gen: WD/WN, NAD Head: [place]/AT, No temporalis wasting. Prominent temp pulse not noted. Ear/Nose/Throat: Hearing grossly intact, nares w/o erythema or drainage, oropharynx w/o Erythema/Exudate,  Eyes: PERRLA, EOMI.  Neck: Supple, no nuchal rigidity.  No JVD.  Pulmonary:  Good air movement, no use of accessory muscles.  Cardiac: RRR, normal S1, S2. Vascular:  Vessel Right Left  Radial Palpable Palpable  Ulnar Palpable Palpable  Brachial Palpable Palpable  Carotid Palpable, without bruit Palpable, without bruit  Aorta Not palpable N/A  Femoral Palpable Not Palpable  Popliteal Palpable Not Palpable  PT Palpable Not Palpable  DP Not Palpable Not Palpable   Gastrointestinal: soft,  non-tender/non-distended. No guarding/reflex.  Musculoskeletal: M/S 5/5 throughout.  No deformity or atrophy. Moderate LLE edema.  Mild RLE edema Neurologic: CN 2-12 intact. Pain and light touch intact in extremities.  Symmetrical.  Speech is fluent. Motor exam as listed above. Psychiatric: Judgment intact, Mood & affect appropriate for pt's clinical situation. Dermatologic: No rashes or ulcers noted.  No cellulitis or open wounds. Lymph : No Cervical, Axillary, or  Inguinal lymphadenopathy.    CBC Lab Results  Component Value Date   WBC 7.7 08/17/2014   HGB 9.6* 08/17/2014   HCT 29.6* 08/17/2014   MCV 79.2* 08/17/2014   PLT 314 08/17/2014    BMET    Component Value Date/Time   NA 136 08/17/2014 2205   NA 140 01/28/2014 0437   NA 139 04/19/2013 1652   K 3.8 08/17/2014 2205   K 3.3* 01/28/2014 0437   CL 98* 08/17/2014 2205   CL 106 01/28/2014 0437   CO2 29 08/17/2014 2205   CO2 25 01/28/2014 0437   GLUCOSE 103* 08/17/2014 2205   GLUCOSE 87 01/28/2014 0437   GLUCOSE 110* 04/19/2013 1652   BUN 28* 08/17/2014 2205   BUN 21* 05/08/2014 1052   BUN 21 04/19/2013 1652   CREATININE 0.93 08/17/2014 2205   CREATININE 0.74 05/08/2014 1052   CALCIUM 9.1 08/17/2014 2205   CALCIUM 9.2 01/28/2014 0437   GFRNONAA 55* 08/17/2014 2205   GFRNONAA >60 05/08/2014 1052   GFRNONAA >60 01/28/2014 0437   GFRAA >60 08/17/2014 2205   GFRAA >60 05/08/2014 1052   GFRAA >60 01/28/2014 0437   CrCl cannot be calculated (Unknown ideal weight.).  COAG Lab Results  Component Value Date   INR 1.1 05/09/2013   INR 1.4 05/08/2013   INR 0.9 04/23/2013    Radiology No results found.   Assessment/Plan 1. ischemic left lower extremity with occlusion of her femoral to popliteal bypass graft now with rest pain. This is clearly a serious and limb threatening situation. The patient has been admitted to the hospital and we will get her to angiography as soon as possible to try to get a thrombolysis  catheter in place to treat her bypass graft. Second look angiography would be planned tomorrow morning if all goes well. Central line will be placed as well. Risks and benefits of the discussed with the patient and her husband and they're agreeable to proceed. 2. Chronic lower extremity swelling/lymphedema. Secondary to multiple previous surgeries and chronic scarring of the lymphatic channels. This is reasonably stable. 3. Multiple previous failed interventions and bypass. On appropriate anticoagulation. 4. HTN. Stable on outpatient medications  Olaf Mesa, MD  02/27/2015 4:10 PM

## 2015-02-27 NOTE — Plan of Care (Signed)
Problem: Phase I Progression Outcomes Goal: Distal pulses equal to baseline Outcome: Not Progressing Pulse to right leg absent Md aware. Goal: Voiding-avoid urinary catheter unless indicated Outcome: Not Progressing Foley in place

## 2015-02-28 ENCOUNTER — Inpatient Hospital Stay: Payer: Medicare Other

## 2015-02-28 ENCOUNTER — Encounter
Admission: AD | Disposition: A | Discharge status: Skilled Nursing Facility | Payer: Self-pay | Source: Ambulatory Visit | Attending: Vascular Surgery

## 2015-02-28 ENCOUNTER — Encounter: Payer: Self-pay | Admitting: Vascular Surgery

## 2015-02-28 HISTORY — PX: PERIPHERAL VASCULAR CATHETERIZATION: SHX172C

## 2015-02-28 LAB — BASIC METABOLIC PANEL
ANION GAP: 7 (ref 5–15)
BUN: 15 mg/dL (ref 6–20)
CALCIUM: 8.6 mg/dL — AB (ref 8.9–10.3)
CO2: 33 mmol/L — ABNORMAL HIGH (ref 22–32)
Chloride: 99 mmol/L — ABNORMAL LOW (ref 101–111)
Creatinine, Ser: 0.73 mg/dL (ref 0.44–1.00)
Glucose, Bld: 109 mg/dL — ABNORMAL HIGH (ref 65–99)
Potassium: 3.8 mmol/L (ref 3.5–5.1)
Sodium: 139 mmol/L (ref 135–145)

## 2015-02-28 LAB — CBC
HCT: 29.9 % — ABNORMAL LOW (ref 35.0–47.0)
Hemoglobin: 9.5 g/dL — ABNORMAL LOW (ref 12.0–16.0)
MCH: 26.7 pg (ref 26.0–34.0)
MCHC: 31.9 g/dL — ABNORMAL LOW (ref 32.0–36.0)
MCV: 83.4 fL (ref 80.0–100.0)
PLATELETS: 235 10*3/uL (ref 150–440)
RBC: 3.58 MIL/uL — AB (ref 3.80–5.20)
RDW: 17.7 % — AB (ref 11.5–14.5)
WBC: 8.4 10*3/uL (ref 3.6–11.0)

## 2015-02-28 LAB — FIBRINOGEN: FIBRINOGEN: 300 mg/dL (ref 210–470)

## 2015-02-28 LAB — AMMONIA: AMMONIA: 25 umol/L (ref 9–35)

## 2015-02-28 SURGERY — LOWER EXTREMITY ANGIOGRAPHY
Anesthesia: Moderate Sedation | Laterality: Left

## 2015-02-28 MED ORDER — HEPARIN SODIUM (PORCINE) 1000 UNIT/ML IJ SOLN
INTRAMUSCULAR | Status: AC
  Filled 2015-02-28: qty 1

## 2015-02-28 MED ORDER — HEPARIN (PORCINE) IN NACL 2-0.9 UNIT/ML-% IJ SOLN
INTRAMUSCULAR | Status: AC
  Filled 2015-02-28: qty 1000

## 2015-02-28 MED ORDER — ALPRAZOLAM 0.25 MG PO TABS
0.2500 mg | ORAL_TABLET | Freq: Three times a day (TID) | ORAL | Status: DC | PRN
  Administered 2015-02-28: 0.25 mg via ORAL
  Filled 2015-02-28: qty 1

## 2015-02-28 MED ORDER — FUROSEMIDE 10 MG/ML IJ SOLN
INTRAMUSCULAR | Status: AC
  Administered 2015-02-28: 20 mg via INTRAVENOUS
  Filled 2015-02-28: qty 2

## 2015-02-28 MED ORDER — TIOTROPIUM BROMIDE MONOHYDRATE 18 MCG IN CAPS
18.0000 ug | ORAL_CAPSULE | Freq: Every day | RESPIRATORY_TRACT | Status: DC
  Administered 2015-02-28 – 2015-03-03 (×4): 18 ug via RESPIRATORY_TRACT
  Filled 2015-02-28: qty 5

## 2015-02-28 MED ORDER — FUROSEMIDE 10 MG/ML IJ SOLN
20.0000 mg | Freq: Once | INTRAMUSCULAR | Status: AC
  Administered 2015-02-28: 20 mg via INTRAVENOUS

## 2015-02-28 MED ORDER — FENTANYL CITRATE (PF) 100 MCG/2ML IJ SOLN
INTRAMUSCULAR | Status: AC
  Filled 2015-02-28: qty 2

## 2015-02-28 MED ORDER — TIROFIBAN HCL IN NACL 5-0.9 MG/100ML-% IV SOLN
0.0750 ug/kg/min | INTRAVENOUS | Status: AC
  Administered 2015-02-28 – 2015-03-01 (×2): 0.075 ug/kg/min via INTRAVENOUS
  Filled 2015-02-28: qty 100

## 2015-02-28 MED ORDER — MIDAZOLAM HCL 5 MG/5ML IJ SOLN
INTRAMUSCULAR | Status: AC
  Filled 2015-02-28: qty 5

## 2015-02-28 MED ORDER — LIDOCAINE-EPINEPHRINE (PF) 1 %-1:200000 IJ SOLN
INTRAMUSCULAR | Status: AC
  Filled 2015-02-28: qty 30

## 2015-02-28 MED ORDER — HEPARIN SODIUM (PORCINE) 1000 UNIT/ML IJ SOLN
INTRAMUSCULAR | Status: DC | PRN
  Administered 2015-02-28: 3000 [IU] via INTRAVENOUS

## 2015-02-28 MED ORDER — KETOROLAC TROMETHAMINE 15 MG/ML IJ SOLN
15.0000 mg | Freq: Once | INTRAMUSCULAR | Status: AC
  Administered 2015-02-28: 15 mg via INTRAVENOUS
  Filled 2015-02-28: qty 1

## 2015-02-28 MED ORDER — CEFUROXIME SODIUM 1.5 G IJ SOLR
1.5000 g | Freq: Once | INTRAMUSCULAR | Status: AC
  Administered 2015-02-28: 1.5 g via INTRAVENOUS

## 2015-02-28 MED ORDER — FAMOTIDINE 20 MG PO TABS
40.0000 mg | ORAL_TABLET | Freq: Once | ORAL | Status: AC
  Administered 2015-02-28: 40 mg via ORAL
  Filled 2015-02-28: qty 2

## 2015-02-28 MED ORDER — TIROFIBAN HCL IV 12.5 MG/250 ML
INTRAVENOUS | Status: AC
  Filled 2015-02-28: qty 250

## 2015-02-28 MED ORDER — IOHEXOL 300 MG/ML  SOLN
INTRAMUSCULAR | Status: DC | PRN
  Administered 2015-02-28: 45 mL via INTRA_ARTERIAL

## 2015-02-28 MED ORDER — MIDAZOLAM HCL 2 MG/2ML IJ SOLN
INTRAMUSCULAR | Status: DC | PRN
  Administered 2015-02-28 (×2): 2 mg via INTRAVENOUS

## 2015-02-28 MED ORDER — TIROFIBAN HCL IN NACL 5-0.9 MG/100ML-% IV SOLN
INTRAVENOUS | Status: AC
  Filled 2015-02-28: qty 100

## 2015-02-28 MED ORDER — FENTANYL CITRATE (PF) 100 MCG/2ML IJ SOLN
INTRAMUSCULAR | Status: DC | PRN
  Administered 2015-02-28 (×2): 50 ug via INTRAVENOUS

## 2015-02-28 MED ORDER — RIVAROXABAN 20 MG PO TABS
20.0000 mg | ORAL_TABLET | Freq: Every day | ORAL | Status: DC
  Administered 2015-03-01 – 2015-03-03 (×3): 20 mg via ORAL
  Filled 2015-02-28: qty 2
  Filled 2015-02-28: qty 1
  Filled 2015-02-28: qty 2
  Filled 2015-02-28: qty 1

## 2015-02-28 MED ORDER — PIPERACILLIN-TAZOBACTAM 3.375 G IVPB
3.3750 g | Freq: Three times a day (TID) | INTRAVENOUS | Status: DC
  Administered 2015-02-28 – 2015-03-03 (×9): 3.375 g via INTRAVENOUS
  Filled 2015-02-28 (×13): qty 50

## 2015-02-28 SURGICAL SUPPLY — 13 items
BALLN ULTRVRSE 2X150X150 (BALLOONS) ×4
BALLN ULTRVRSE 2X150X150 OTW (BALLOONS) ×2
BALLN ULTRVRSE 3.5X100X150 (BALLOONS) ×4
BALLOON ULTRVRSE 2X150X150 OTW (BALLOONS) IMPLANT
BALLOON ULTRVRSE 3.5X100X150 (BALLOONS) IMPLANT
CATH VERT 100CM (CATHETERS) ×2 IMPLANT
DEVICE PRESTO INFLATION (MISCELLANEOUS) ×2 IMPLANT
DEVICE SOLENT OMNI 120CM (CATHETERS) ×2 IMPLANT
DEVICE STARCLOSE SE CLOSURE (Vascular Products) ×2 IMPLANT
PACK ANGIOGRAPHY (CUSTOM PROCEDURE TRAY) ×4 IMPLANT
TOWEL OR 17X26 4PK STRL BLUE (TOWEL DISPOSABLE) ×4 IMPLANT
WIRE G V18X300CM (WIRE) ×2 IMPLANT
WIRE MAGIC TORQUE 260C (WIRE) ×4 IMPLANT

## 2015-02-28 NOTE — Progress Notes (Signed)
Took over care from [employer] at this time.

## 2015-02-28 NOTE — Progress Notes (Addendum)
Dr. Lucky Cowboy called due change in breath sounds and increased pain to left leg without change from initial assessment.   Telephone orders received.

## 2015-02-28 NOTE — Progress Notes (Signed)
Dr. Lucky Cowboy re-turned call.  Order received.  Nurse will continue to monitor closely.

## 2015-02-28 NOTE — Progress Notes (Signed)
Unable to complete assessment as patient was transported to specials recovery for vascular procedure.  Patient transported via bed with Roselyn Reef, orderly and RN present.  Verbal report given to [place].

## 2015-02-28 NOTE — Op Note (Signed)
[facility] Percutaneous Study/Intervention Procedural Note   Date of Surgery: 02/28/2015  Surgeon(s):Delaine Canter   Assistants:none  Pre-operative Diagnosis: PAD with rest pain left lower extremity, occluded femoral to popliteal bypass, status post catheter placement for overnight lysis.  Post-operative diagnosis: Same  Procedure(s) Performed: 1. Angiogram through existing catheter left lower extremity 2. Catheter placement into left dorsalis pedis artery from right femoral approach 3. Selective left lower extremity angiogram 4. Percutaneous transluminal angioplasty of distal anterior tibial artery and dorsalis pedis artery with 2 mm diameter angioplasty balloon 5. Percutaneous transluminal angioplasty of distal anastomosis and proximal anterior tibial artery with 3.5 mm diameter angioplasty balloon  6.  Mechanical rheolytic thrombectomy to the mid to distal bypass graft, distal bypass anastomosis to the anterior tibial artery, and distal anterior tibial artery with the AngioJet Omni catheter for about 223 cc of effluent returned 7. StarClose closure device right femoral artery  EBL: Minimal  Contrast: 45 cc  Fluro Time: 4.4 minutes  Moderate Conscious Sedation Time: approximately 40 minutes using 4 mg of Versed and 100 mcg of Fentanyl  Indications: Patient is a 82 y.o.female with left lower extremity ischemia with rest pain. She was started on lytic therapy overnight and ran on TPA until this morning when she was brought back for angiography. The patient is brought in for angiography for further evaluation and potential treatment. Risks and benefits are discussed and informed consent is obtained  Procedure: The patient was identified and appropriate procedural time out was performed. The patient was then placed supine on the table and prepped and draped in the usual  sterile fashion.Moderate conscious sedation was administered during a face to face encounter with the patient throughout the procedure with my supervision of the RN administering medicines and monitoring the patient's vital signs, pulse oximetry, telemetry and mental status throughout from the start of the procedure until the patient was taken to the recovery room. The existing lytic catheter and the bypass graft was removed and a Magic torque wire was placed. Selective left lower extremity angiogram was then performed through the sheath. This demonstrated bypass now with flow but thrombus seen in the distal bypass, distal anastomosis and proximal anterior tibial artery, and in the distal anterior tibial artery just above the ankle. The patient was systemically heparinized. I then used a Kumpe catheter and the Magic torque wire to navigate across the anastomosis and into the anterior tibial artery. I then exchanged for a 0.018 wire and advanced this into the foot to the dorsalis pedis artery. Mechanical rheolytic thrombectomy was then performed with the AngioJet Omni catheter throughout the mid to distal bypass graft, the distal bypass anastomosis and proximal anterior tibial artery, and distal anterior tibial artery down to the foot. 2 passes were done and a total of 223 cc of effluent was returned. Thrombosis improved, but there was still some intimal hyperplasia and narrowing at the distal bypass anastomosis despite treating this yesterday with a 3 mm balloon as well as occlusion of the distal anterior tibial artery just above the ankle. I upsized to a 3.5 mm diameter angioplasty balloon and treated the proximal anterior tibial artery and distal bypass anastomosis inflating this to 12 atm for 1 minute. About a 30% residual stenosis was identified after this angioplasty and thrombus was largely removed. For the distal anterior tibial artery down into the dorsalis pedis artery I used a 2 mm diameter by 15 cm  length angioplasty balloon inflated across the distal anterior tibial artery and dorsalis  pedis artery out to the mid foot. This was inflated to 14 atm for 1 minute. There was spasm and some residual thrombus remaining in the foot, but the flow was significantly improved and I did not feel there was anything more I could do from a percutaneous standpoint. I elected to terminate the procedure. The sheath was removed and StarClose closure device was deployed in the left femoral artery with excellent hemostatic result. The patient was taken to the recovery room in stable condition having tolerated the procedure well.  Findings:   Left Lower Extremity: Bypass now with flow but thrombus seen in the distal bypass, distal anastomosis and proximal anterior tibial artery, and in the distal anterior tibial artery just above the ankle. Some improvement with intervention.   Disposition: Patient was taken to the recovery room in stable condition having tolerated the procedure well.  Complications: None  Raschelle Wisenbaker 02/28/2015 8:51 AM

## 2015-02-28 NOTE — Progress Notes (Signed)
ANTICOAGULATION CONSULT NOTE - Initial Consult  Pharmacy Consult for Tirofiban Indication: Ischemic LLE  Allergies  Allergen Reactions  . Codeine Diarrhea  . Hydrocodone   . Levofloxacin In D5w Other (See Comments)    Pt denies, questionable  . Tramadol Nausea Only    Patient Measurements: Height: 4\' 11"  (149.9 cm) Weight: 139 lb 1.8 oz (63.1 kg) IBW/kg (Calculated) : 43.2  Vital Signs: Temp: 97.5 F (36.4 C) (02/17 1040) Temp Source: Oral (02/17 1040) BP: 163/56 mmHg (02/17 1000) Pulse Rate: 75 (02/17 1040)  Labs:  Recent Labs  02/27/15 1555 02/27/15 2059 02/28/15 0027  HGB 11.3* 10.2* 9.5*  HCT 35.3 32.0* 29.9*  PLT 302 228 235  APTT 36  --   --   LABPROT 22.2*  --   --   INR 1.96  --   --   CREATININE 0.77  --   --     Estimated Creatinine Clearance: 42.3 mL/min (by C-G formula based on Cr of 0.77).   Medical History: Past Medical History  Diagnosis Date  . GERD (gastroesophageal reflux disease)   . Asthma   . Anemia   . Arthritis   . H/O blood clots 2014  . Gallstones 2015  . Hypertension     Medications:  Scheduled:  . diltiazem  180 mg Oral Daily  . gabapentin  300 mg Oral BID  . metolazone  2.5 mg Oral Once per day on Mon Thu  . pantoprazole sodium  40 mg Oral QAC breakfast  . [START ON 03/01/2015] rivaroxaban  20 mg Oral Daily   Infusions:  . sodium chloride 75 mL/hr at 02/28/15 0730  . tirofiban 0.075 mcg/kg/min (02/28/15 0945)    Assessment: 82 y/o F with ischemic LLE s/p vascular intervention to begin tirofiban.    Plan:  Tirofiban ordered at 0.075 mcg/kg/min for 24 hours. Dosing is appropriate for renal function.  Will follow until d/c.   Ulice Dash D 02/28/2015,11:11 AM

## 2015-02-28 NOTE — Care Management (Signed)
Vascular intervention.  Patient currently on 6 liters of 02.  Receiving continuous infusion of aggrastat.

## 2015-02-28 NOTE — H&P (Signed)
Patient with worsening hypoxia and has progressed to 100% non-re breather.  Dr. Lucky Cowboy paged.  Report from CXR back and will be reported upon return phone call from page.

## 2015-02-28 NOTE — Progress Notes (Signed)
Spoke to MD, Dr. Lucky Cowboy, about patient's low blood pressure and reports of pain (patient sleeping between care and appears calm). MD stated that patient would have pain from reperfusion of leg and gave no new orders for blood pressure (patient's pressure did increase when awoken from sleep). Due to patient's allergies to most oral pain medications, RN administered tylenol for pain in leg.

## 2015-02-28 NOTE — Progress Notes (Signed)
Notfied Dr. Olevia Bowens about hypotension to see if he wanted to initiate a fluid bolus. He would like to continue to watch. If pressure trends down or urine output decreases notify him again.

## 2015-02-28 NOTE — Plan of Care (Signed)
Problem: Phase I Progression Outcomes Goal: Voiding-avoid urinary catheter unless indicated Outcome: Progressing Foley catheter present when RN took over from [employer].

## 2015-02-28 NOTE — Progress Notes (Signed)
Spoke to Dr. Anselm Jungling on the phone about ordering of MSRA PCR test when one ordered yesterday (and was negative). MD discontinued test ordered today.

## 2015-02-28 NOTE — Progress Notes (Signed)
ANTIBIOTIC CONSULT NOTE - INITIAL  Pharmacy Consult for Zosyn Indication: pneumonia  Allergies  Allergen Reactions  . Codeine Diarrhea  . Hydrocodone   . Levofloxacin In D5w Other (See Comments)    Pt denies, questionable  . Tramadol Nausea Only    Patient Measurements: Height: 4\' 11"  (149.9 cm) Weight: 139 lb 1.8 oz (63.1 kg) IBW/kg (Calculated) : 43.2  Vital Signs: Temp: 97.5 F (36.4 C) (02/17 1040) Temp Source: Oral (02/17 1040) BP: 137/50 mmHg (02/17 1400) Pulse Rate: 105 (02/17 1400) Intake/Output from previous day: 02/16 0701 - 02/17 0700 In: 1351.4 [P.O.:360; I.V.:991.4] Out: 725 [Urine:725] Intake/Output from this shift: Total I/O In: 345.9 [I.V.:345.9] Out: 900 [Urine:900]  Labs:  Recent Labs  02/27/15 1555 02/27/15 2059 02/28/15 0027 02/28/15 1356  WBC 7.3 7.6 8.4  --   HGB 11.3* 10.2* 9.5*  --   PLT 302 228 235  --   CREATININE 0.77  --   --  0.73   Estimated Creatinine Clearance: 42.3 mL/min (by C-G formula based on Cr of 0.73). No results for input(s): VANCOTROUGH, VANCOPEAK, VANCORANDOM, GENTTROUGH, GENTPEAK, GENTRANDOM, TOBRATROUGH, TOBRAPEAK, TOBRARND, AMIKACINPEAK, AMIKACINTROU, AMIKACIN in the last 72 hours.   Microbiology: Recent Results (from the past 720 hour(s))  MRSA PCR Screening     Status: None   Collection Time: 02/27/15  8:37 PM  Result Value Ref Range Status   MRSA by PCR NEGATIVE NEGATIVE Final    Comment:        The GeneXpert MRSA Assay (FDA approved for NASAL specimens only), is one component of a comprehensive MRSA colonization surveillance program. It is not intended to diagnose MRSA infection nor to guide or monitor treatment for MRSA infections.     Medical History: Past Medical History  Diagnosis Date  . GERD (gastroesophageal reflux disease)   . Asthma   . Anemia   . Arthritis   . H/O blood clots 2014  . Gallstones 2015  . Hypertension     Medications:  Scheduled:  . diltiazem  180 mg Oral Daily   . gabapentin  300 mg Oral BID  . metolazone  2.5 mg Oral Once per day on Mon Thu  . pantoprazole sodium  40 mg Oral QAC breakfast  . piperacillin-tazobactam (ZOSYN)  IV  3.375 g Intravenous 3 times per day  . [START ON 03/01/2015] rivaroxaban  20 mg Oral Daily  . tiotropium  18 mcg Inhalation Daily   Infusions:  . tirofiban 0.075 mcg/kg/min (02/28/15 0945)   Assessment: 82 y/o F with RF and possible PNA per CXR.   Plan:  Will begin Zosyn 3.5 g EI q 8 hours.   Pharmacy will continue to follow and adjsust per consult.    Ulice Dash D 02/28/2015,3:42 PM

## 2015-02-28 NOTE — Progress Notes (Addendum)
Pharmacy Antibiotic Note  Natasha [name] is a 82 y.o. female admitted on 02/27/2015 with pneumonia.  Pharmacy has been consulted for Zosyn dosing.  Plan: Zosyn 3.375g IV q8h (4 hour infusion).  Height: 4\' 11"  (149.9 cm) Weight: 139 lb 1.8 oz (63.1 kg) IBW/kg (Calculated) : 43.2  Temp (24hrs), Avg:97.8 F (36.6 C), Min:97.1 F (36.2 C), Max:98.2 F (36.8 C)   Recent Labs Lab 02/27/15 1555 02/27/15 2059 02/28/15 0027  WBC 7.3 7.6 8.4  CREATININE 0.77  --   --     Estimated Creatinine Clearance: 42.3 mL/min (by C-G formula based on Cr of 0.77).    Allergies  Allergen Reactions  . Codeine Diarrhea  . Hydrocodone   . Levofloxacin In D5w Other (See Comments)    Pt denies, questionable  . Tramadol Nausea Only    Antimicrobials this admission: Zosyn 2/17 >>    Dose adjustments this admission: N/A  Microbiology results: 2/17 BCx x 2: NGTD 2/16 MRSA PCR: negative  Pharmacy will continue to monitor and adjust per consult.    Tarra Pence L 02/28/2015 2:43 PM

## 2015-02-28 NOTE — Consult Note (Signed)
[facility] NAME: Natasha [name]    MR#:  [id number]  DATE OF BIRTH:  03/20/1928  DATE OF ADMISSION:  02/27/2015  PRIMARY CARE PHYSICIAN: Rusty Aus., MD   REQUESTING/REFERRING PHYSICIAN: Dew  CHIEF COMPLAINT:  Hypoxia.  HISTORY OF PRESENT ILLNESS: Natasha [name]  is a 82 y.o. female with a known history of depression as a Gastric reflux disease, asthma, anemia, gallstones, hypertension and anemia, arthritis- also had some vascular bypass graft on her left lower extremity in the past by Dr. Lucky Cowboy. Yesterday she started having swelling on her left lower extremity and it was painful so she called Dr. Bunnie Domino office he called her to come to emergency room directly and she was admitted. Dr. Charlett Nose procedure for the declotting of her vascular bypass procedure. Postprocedure she had episode of CVA or shortness of breath and so medical consult was called in. Patient's husband is also in the room he says that for last few days she has worsening over the regular coughing. Just 2 L nasal cannula oxygen at her baseline at home. She also had some chills but denies any fever. She does not have any sputum production. She denies any chest pain. There was some questionable episode of vomiting after the procedure also.  PAST MEDICAL HISTORY:   Past Medical History  Diagnosis Date  . GERD (gastroesophageal reflux disease)   . Asthma   . Anemia   . Arthritis   . H/O blood clots 2014  . Gallstones 2015  . Hypertension     PAST SURGICAL HISTORY: Past Surgical History  Procedure Laterality Date  . Abdominal hysterectomy  1960  . Appendectomy  1946  . Cochlear implant  2009  . Eye surgery  [phone]    cataract  . Stent placement  2006-2014    multiple stent placements in legs  . Colonoscopy  2015    Dr. Rayann Heman   . Cholecystectomy  04-24-13  . Breast biopsy Bilateral     negative  . Peripheral vascular catheterization N/A 02/27/2015     Procedure: Abdominal Aortogram w/Lower Extremity;  Surgeon: Algernon Huxley, MD;  Location: [facility];  Service: Cardiovascular;  Laterality: N/A;  . Peripheral vascular catheterization  02/27/2015    Procedure: Lower Extremity Intervention;  Surgeon: Algernon Huxley, MD;  Location: [facility];  Service: Cardiovascular;;  . Peripheral vascular catheterization Left 02/28/2015    Procedure: Lower Extremity Angiography;  Surgeon: Algernon Huxley, MD;  Location: [facility];  Service: Cardiovascular;  Laterality: Left;  . Peripheral vascular catheterization  02/28/2015    Procedure: Lower Extremity Intervention;  Surgeon: Algernon Huxley, MD;  Location: [facility];  Service: Cardiovascular;;    SOCIAL HISTORY:  Social History  Substance Use Topics  . Smoking status: Former Research scientist (life sciences)  . Smokeless tobacco: Not on file  . Alcohol Use: No    FAMILY HISTORY:  Family History  Problem Relation Age of Onset  . Heart disease Mother   . Heart disease Father   . Cancer Sister     lung  . Breast cancer Daughter     DRUG ALLERGIES:  Allergies  Allergen Reactions  . Codeine Diarrhea  . Hydrocodone   . Levofloxacin In D5w Other (See Comments)    Pt denies, questionable  . Tramadol Nausea Only    REVIEW OF SYSTEMS:   CONSTITUTIONAL: No fever, fatigue or weakness.  EYES: No blurred or double  vision.  EARS, NOSE, AND THROAT: No tinnitus or ear pain.  RESPIRATORY: Positive cough, shortness of breath, no wheezing or hemoptysis.  CARDIOVASCULAR: No chest pain, orthopnea, edema.  GASTROINTESTINAL: No nausea, vomiting, diarrhea or abdominal pain.  GENITOURINARY: No dysuria, hematuria.  ENDOCRINE: No polyuria, nocturia,  HEMATOLOGY: No anemia, easy bruising or bleeding SKIN: No rash or lesion. MUSCULOSKELETAL: No joint pain or arthritis.   NEUROLOGIC: No tingling, numbness, weakness.  PSYCHIATRY: No anxiety or depression.   MEDICATIONS AT HOME:  Prior to Admission  medications   Medication Sig Start Date End Date Taking? Authorizing Provider  albuterol (PROVENTIL HFA;VENTOLIN HFA) 108 (90 BASE) MCG/ACT inhaler Inhale 1-2 puffs into the lungs 2 (two) times daily.   Yes Historical Provider, MD  Calcium-Magnesium-Vitamin D (CALCIUM 1200+D3 PO) Take by mouth.   Yes Historical Provider, MD  diltiazem (TIAZAC) 180 MG 24 hr capsule Take 1 capsule by mouth daily. 02/19/13  Yes Historical Provider, MD  esomeprazole (NEXIUM) 10 MG packet Take 10 mg by mouth daily before breakfast.   Yes Historical Provider, MD  gabapentin (NEURONTIN) 300 MG capsule Take 300 mg by mouth 2 (two) times daily.   Yes Historical Provider, MD  metolazone (ZAROXOLYN) 2.5 MG tablet Take 2.5 mg by mouth 2 (two) times a week.   Yes Historical Provider, MD  oxyCODONE-acetaminophen (PERCOCET/ROXICET) 5-325 MG per tablet Take 1 tablet by mouth 2 (two) times daily.   Yes Historical Provider, MD  OXYGEN-HELIUM IN Inhale into the lungs.   Yes Historical Provider, MD  rivaroxaban (XARELTO) 20 MG TABS tablet Take 20 mg by mouth daily with supper.   Yes Historical Provider, MD  cilostazol (PLETAL) 100 MG tablet Take 200 mg by mouth 2 (two) times daily. Reported on 02/27/2015    Historical Provider, MD  HYDROmorphone (DILAUDID) 2 MG tablet Take 1 tablet (2 mg total) by mouth 3 (three) times daily as needed for severe pain. Patient not taking: Reported on 02/27/2015 08/17/14 08/21/15  Daymon Larsen, MD      PHYSICAL EXAMINATION:   VITAL SIGNS: Blood pressure 137/50, pulse 105, temperature 97.5 F (36.4 C), temperature source Oral, resp. rate 19, height 4\' 11"  (1.499 m), weight 63.1 kg (139 lb 1.8 oz), SpO2 90 %.  GENERAL:  82 y.o.-year-old patient lying in the bed with no acute distress.  EYES: Pupils equal, round, reactive to light and accommodation. No scleral icterus. Extraocular muscles intact.  HEENT: Head atraumatic, normocephalic. Oropharynx and nasopharynx clear.  NECK:  Supple, no jugular venous  distention. No thyroid enlargement, no tenderness.  LUNGS: Normal breath sounds bilaterally, no wheezing, some crepitation. Positive  use of accessory muscles of respiration. Using 6 ltr oxygen via [place]. CARDIOVASCULAR: S1, S2 normal. No murmurs, rubs, or gallops.  ABDOMEN: Soft, nontender, nondistended. Bowel sounds present. No organomegaly or mass.  EXTREMITIES: No pedal edema, cyanosis, or clubbing. Distal pulse present on both LE. NEUROLOGIC: Cranial nerves II through XII are intact. Muscle strength 5/5 in all extremities. Sensation intact. Gait not checked.  PSYCHIATRIC: The patient is alert and oriented x 3.  SKIN: No obvious rash, lesion, or ulcer.   LABORATORY PANEL:   CBC  Recent Labs Lab 02/27/15 1555 02/27/15 2059 02/28/15 0027  WBC 7.3 7.6 8.4  HGB 11.3* 10.2* 9.5*  HCT 35.3 32.0* 29.9*  PLT 302 228 235  MCV 82.4 83.0 83.4  MCH 26.5 26.4 26.7  MCHC 32.1 31.8* 31.9*  RDW 17.3* 17.7* 17.7*   ------------------------------------------------------------------------------------------------------------------  Chemistries   Recent Labs Lab  02/27/15 1555  NA 139  K 3.6  CL 97*  CO2 34*  GLUCOSE 101*  BUN 21*  CREATININE 0.77  CALCIUM 10.0  MG 1.6*   ------------------------------------------------------------------------------------------------------------------ estimated creatinine clearance is 42.3 mL/min (by C-G formula based on Cr of 0.77). ------------------------------------------------------------------------------------------------------------------ No results for input(s): TSH, T4TOTAL, T3FREE, THYROIDAB in the last 72 hours.  Invalid input(s): FREET3   Coagulation profile  Recent Labs Lab 02/27/15 1555  INR 1.96   ------------------------------------------------------------------------------------------------------------------- No results for input(s): DDIMER in the last 72  hours. -------------------------------------------------------------------------------------------------------------------  Cardiac Enzymes No results for input(s): CKMB, TROPONINI, MYOGLOBIN in the last 168 hours.  Invalid input(s): CK ------------------------------------------------------------------------------------------------------------------ Invalid input(s): POCBNP  ---------------------------------------------------------------------------------------------------------------  Urinalysis    Component Value Date/Time   COLORURINE YELLOW* 08/11/2014 1747   COLORURINE Straw 01/26/2014 1720   APPEARANCEUR CLEAR* 08/11/2014 1747   APPEARANCEUR Clear 01/26/2014 1720   LABSPEC 1.012 08/11/2014 1747   LABSPEC 1.004 01/26/2014 1720   PHURINE 5.0 08/11/2014 1747   PHURINE 5.0 01/26/2014 1720   GLUCOSEU NEGATIVE 08/11/2014 1747   GLUCOSEU Negative 01/26/2014 1720   HGBUR NEGATIVE 08/11/2014 1747   HGBUR Negative 01/26/2014 1720   BILIRUBINUR NEGATIVE 08/11/2014 1747   BILIRUBINUR Negative 01/26/2014 1720   KETONESUR NEGATIVE 08/11/2014 1747   KETONESUR Negative 01/26/2014 1720   PROTEINUR NEGATIVE 08/11/2014 1747   PROTEINUR Negative 01/26/2014 1720   NITRITE NEGATIVE 08/11/2014 1747   NITRITE Negative 01/26/2014 1720   LEUKOCYTESUR NEGATIVE 08/11/2014 1747   LEUKOCYTESUR Negative 01/26/2014 1720     RADIOLOGY: Dg Chest 1 View  02/28/2015  CLINICAL DATA:  Labored breathing. EXAM: CHEST 1 VIEW COMPARISON:  August 17, 2014. FINDINGS: Stable cardiomediastinal silhouette. No pneumothorax or significant pleural effusion is noted. Large hiatal hernia is noted. Left lung is clear. Right upper lobe airspace opacity is noted concerning for pneumonia. Right internal jugular catheter is noted with distal tip in expected position of cavoatrial junction. Old left clavicular fracture is noted. IMPRESSION: Right upper lobe pneumonia.  Large hiatal hernia. Electronically Signed   By: Marijo Conception, M.D.   On: 02/28/2015 13:01    EKG: Orders placed or performed in visit on 08/17/14  . EKG 12-Lead  . EKG 12-Lead  . EKG 12-Lead    IMPRESSION AND PLAN:  * Acute on chronic hypoxic respiratory failure  Present evidence of pneumonia on right upper lobe on chest x-ray.  Patient has chills, no elevated white cell count.  Started on Zosyn, I agree with the choice.  I will try to collect the sputum culture, and switched to oral Augmentin once patient improves.  We will encourage use of spirometry, and use nebulizer therapy.  Check MRSA PCR, currently no need to cover for MRSA.  * COPD with chronic respiratory failure  On chronic oxygen at home use, continue nebulizer therapy.  * History of peripheral vascular disease  Currently vascular surgeries done by Dr. Elie Confer  She is on xarelto, continue that.  * Hypertension  Continue Cardizem and furosemide.  All the records are reviewed and case discussed with ED provider. Management plans discussed with the patient, family and they are in agreement.  CODE STATUS:    Code Status Orders        Start     Ordered   02/27/15 1553  Full code   Continuous     02/27/15 1553    Code Status History    Date Active Date Inactive Code Status Order ID Comments User Context   This patient  has a current code status but no historical code status.    Advance Directive Documentation        Most Recent Value   Type of Advance Directive  Healthcare Power of Attorney   Pre-existing out of facility DNR order (yellow form or pink MOST form)     "MOST" Form in Place?         TOTAL TIME TAKING CARE OF THIS PATIENT: 50 minutes.   Findings and plan discussed with patient's husband was present in the room.  Vaughan Basta M.D on 02/28/2015   Between 7am to 6pm - Pager - [phone]  After 6pm go to [email or web address] - password [email or web address] [facility]  Office  [phone]  CC: Primary care physician;  Rusty Aus., MD   Note: This dictation was prepared with Dragon dictation along with smaller phrase technology. Any transcriptional errors that result from this process are unintentional.

## 2015-02-28 NOTE — Progress Notes (Signed)
Updated Dr. Tressia Miners about decline in patient status and consult need.  Orders received.

## 2015-02-28 NOTE — Progress Notes (Signed)
Spoke to Dr. Lucky Cowboy about no diet order for patient. MD ordered regular diet.

## 2015-03-01 MED ORDER — SODIUM CHLORIDE 0.9 % IV BOLUS (SEPSIS)
500.0000 mL | Freq: Once | INTRAVENOUS | Status: AC
  Administered 2015-03-01: 500 mL via INTRAVENOUS

## 2015-03-01 MED ORDER — KETOROLAC TROMETHAMINE 15 MG/ML IJ SOLN
15.0000 mg | Freq: Once | INTRAMUSCULAR | Status: AC
  Administered 2015-03-01: 15 mg via INTRAVENOUS
  Filled 2015-03-01: qty 1

## 2015-03-01 MED ORDER — SENNA 8.6 MG PO TABS
1.0000 | ORAL_TABLET | Freq: Two times a day (BID) | ORAL | Status: DC
  Administered 2015-03-01 – 2015-03-03 (×4): 8.6 mg via ORAL
  Filled 2015-03-01 (×4): qty 1

## 2015-03-01 MED ORDER — IPRATROPIUM-ALBUTEROL 0.5-2.5 (3) MG/3ML IN SOLN
3.0000 mL | Freq: Four times a day (QID) | RESPIRATORY_TRACT | Status: DC
  Administered 2015-03-01 – 2015-03-03 (×8): 3 mL via RESPIRATORY_TRACT
  Filled 2015-03-01 (×9): qty 3

## 2015-03-01 MED ORDER — BUDESONIDE 0.25 MG/2ML IN SUSP
0.2500 mg | Freq: Two times a day (BID) | RESPIRATORY_TRACT | Status: DC
  Administered 2015-03-01 – 2015-03-03 (×4): 0.25 mg via RESPIRATORY_TRACT
  Filled 2015-03-01 (×4): qty 2

## 2015-03-01 MED ORDER — PRAMIPEXOLE DIHYDROCHLORIDE 0.25 MG PO TABS
0.5000 mg | ORAL_TABLET | Freq: Three times a day (TID) | ORAL | Status: DC
  Administered 2015-03-01 – 2015-03-03 (×8): 0.5 mg via ORAL
  Filled 2015-03-01 (×8): qty 2

## 2015-03-01 MED ORDER — FENTANYL CITRATE (PF) 100 MCG/2ML IJ SOLN: 12.5000 ug | Freq: Once | INTRAMUSCULAR | Status: DC

## 2015-03-01 NOTE — Progress Notes (Signed)
Pt's foley was removed today with. Multiple trips to bedside commode, and chair through out day with slight increase in WOB.  Pt stable at this time. Will continue to assess.

## 2015-03-01 NOTE — Progress Notes (Signed)
Patient ID: Natasha [name], female   DOB: [birth year], 82 y.o.   MRN: [id number] [facility] PROGRESS NOTE  Natasha [name] [id number] DOB: 12-01-1928 DOA: 02/27/2015 PCP: Rusty Aus., MD  HPI/Subjective: Patient with cough and some tightness in the chest and shortness of breath. Also has some constipation. The patient states she only wears oxygen at night.  Objective: Filed Vitals:   03/01/15 0900 03/01/15 1100  BP: 99/69 110/44  Pulse: 78   Temp:    Resp: 19     Filed Weights   02/27/15 2034 02/28/15 0500 03/01/15 0420  Weight: 63.6 kg (140 lb 3.4 oz) 63.1 kg (139 lb 1.8 oz) 63.4 kg (139 lb 12.4 oz)    ROS: Review of Systems  Constitutional: Negative for fever and chills.  Eyes: Negative for blurred vision.  Respiratory: Positive for cough, shortness of breath and wheezing.   Cardiovascular: Positive for chest pain.  Gastrointestinal: Positive for abdominal pain. Negative for nausea, vomiting, diarrhea and constipation.  Genitourinary: Negative for dysuria.  Musculoskeletal: Negative for joint pain.  Neurological: Negative for dizziness and headaches.   Exam: Physical Exam  Constitutional: She is oriented to person, place, and time.  HENT:  Nose: No mucosal edema.  Mouth/Throat: No oropharyngeal exudate or posterior oropharyngeal edema.  Eyes: Conjunctivae, EOM and lids are normal. Pupils are equal, round, and reactive to light.  Neck: No JVD present. Carotid bruit is not present. No edema present. No thyroid mass and no thyromegaly present.  Cardiovascular: S1 normal and S2 normal.  Exam reveals no gallop.   No murmur heard. Pulses:      Dorsalis pedis pulses are 0 on the right side, and 0 on the left side.  Respiratory: No respiratory distress. She has decreased breath sounds in the right middle field, the right lower field, the left middle field and the left lower field. She has wheezes in the right middle field, the right lower field, the  left middle field and the left lower field. She has no rhonchi. She has no rales.  GI: Soft. Bowel sounds are normal. There is no tenderness.  Musculoskeletal:       Right ankle: She exhibits no swelling.       Left ankle: She exhibits no swelling.  Lymphadenopathy:    She has no cervical adenopathy.  Neurological: She is alert and oriented to person, place, and time. No cranial nerve deficit.  Skin: Skin is warm. No rash noted. Nails show no clubbing.  Psychiatric: She has a normal mood and affect.      Data Reviewed: Basic Metabolic Panel:  Recent Labs Lab 02/27/15 1555 02/28/15 1356  NA 139 139  K 3.6 3.8  CL 97* 99*  CO2 34* 33*  GLUCOSE 101* 109*  BUN 21* 15  CREATININE 0.77 0.73  CALCIUM 10.0 8.6*  MG 1.6*  --   PHOS 4.1  --     Recent Labs Lab 02/28/15 1326  AMMONIA 25   CBC:  Recent Labs Lab 02/27/15 1555 02/27/15 2059 02/28/15 0027  WBC 7.3 7.6 8.4  HGB 11.3* 10.2* 9.5*  HCT 35.3 32.0* 29.9*  MCV 82.4 83.0 83.4  PLT 302 228 235   CBG:  Recent Labs Lab 02/27/15 2025  GLUCAP 79    Recent Results (from the past 240 hour(s))  MRSA PCR Screening     Status: None   Collection Time: 02/27/15  8:37 PM  Result Value Ref Range Status   MRSA by PCR NEGATIVE NEGATIVE  Final    Comment:        The GeneXpert MRSA Assay (FDA approved for NASAL specimens only), is one component of a comprehensive MRSA colonization surveillance program. It is not intended to diagnose MRSA infection nor to guide or monitor treatment for MRSA infections.   CULTURE, BLOOD (ROUTINE X 2) w Reflex to PCR ID Panel     Status: None (Preliminary result)   Collection Time: 02/28/15  1:51 PM  Result Value Ref Range Status   Specimen Description BLOOD RIGHT ANTECUBITAL  Final   Special Requests   Final    BOTTLES DRAWN AEROBIC AND ANAEROBIC  AER 1ML ANA 5ML   Culture NO GROWTH < 24 HOURS  Final   Report Status PENDING  Incomplete  CULTURE, BLOOD (ROUTINE X 2) w Reflex to  PCR ID Panel     Status: None (Preliminary result)   Collection Time: 02/28/15  1:52 PM  Result Value Ref Range Status   Specimen Description BLOOD RIGHT WRIST  Final   Special Requests   Final    BOTTLES DRAWN AEROBIC AND ANAEROBIC AER 4ML ANA 1 ML   Culture NO GROWTH < 24 HOURS  Final   Report Status PENDING  Incomplete     Studies: Dg Chest 1 View  02/28/2015  CLINICAL DATA:  Labored breathing. EXAM: CHEST 1 VIEW COMPARISON:  August 17, 2014. FINDINGS: Stable cardiomediastinal silhouette. No pneumothorax or significant pleural effusion is noted. Large hiatal hernia is noted. Left lung is clear. Right upper lobe airspace opacity is noted concerning for pneumonia. Right internal jugular catheter is noted with distal tip in expected position of cavoatrial junction. Old left clavicular fracture is noted. IMPRESSION: Right upper lobe pneumonia.  Large hiatal hernia. Electronically Signed   By: Marijo Conception, M.D.   On: 02/28/2015 13:01    Scheduled Meds: . budesonide (PULMICORT) nebulizer solution  0.25 mg Nebulization BID  . diltiazem  180 mg Oral Daily  . fentaNYL (SUBLIMAZE) injection  12.5 mcg Intravenous Once  . gabapentin  300 mg Oral BID  . ipratropium-albuterol  3 mL Nebulization Q6H  . metolazone  2.5 mg Oral Once per day on Mon Thu  . pantoprazole sodium  40 mg Oral QAC breakfast  . piperacillin-tazobactam (ZOSYN)  IV  3.375 g Intravenous 3 times per day  . pramipexole  0.5 mg Oral TID  . rivaroxaban  20 mg Oral Daily  . senna  1 tablet Oral BID  . tiotropium  18 mcg Inhalation Daily    Assessment/Plan:  1. Acute respiratory failure with hypoxia. Check pulse ox on room air for more morning. I dialed down the oxygen to 2.5 L nasal cannula. 2. Right upper lobe pneumonia. Patient placed on Zosyn. Patient also has a wheeze start nebulizer treatments with budesonide nebulizers. 3. Peripheral vascular disease. Patient status post procedure by Dr. Lucky Cowboy. Patient on full  anticoagulation with Xarelto 4. Essential hypertension blood pressure stable on diltiazem 5. Neuropathy and restless legs on gabapentin and pramipexole. 6. Constipation-   Code Status:     Code Status Orders        Start     Ordered   02/27/15 1553  Full code   Continuous     02/27/15 1553    Code Status History    Date Active Date Inactive Code Status Order ID Comments User Context   This patient has a current code status but no historical code status.    Advance Directive Documentation  Most Recent Value   Type of Advance Directive  Healthcare Power of Attorney   Pre-existing out of facility DNR order (yellow form or pink MOST form)     "MOST" Form in Place?       Family Communication: Spoke with the husband at bedside Disposition Plan: to be determined  Consultants:  Vascular surgery  Antibiotics:  Zosyn  Time spent: 25 minutes  Loletha Grayer  [facility]

## 2015-03-01 NOTE — Plan of Care (Signed)
Problem: Activity Intolerance   Goal: Pt's mobility will improve .  Outcome: Progressing.  Pt was able to walk to bedside commode, and sit in chair for three hours today. With slight increase in WOB.

## 2015-03-01 NOTE — Progress Notes (Signed)
    Subjective  - POD #1  Leg feels better   Physical Exam:  Doppler AT signal bilateral Right groin soft No edema Mild SOB       Assessment/Plan:  POD #1  GU:  D/c foley GI:  Try Senna for constipation OUT of bed to ambulate Transfer to stepdown Check labs in am D/C PAD Appreciate medicine consult.  On Abx for pneumonia PT consult  Annamarie Major 03/01/2015 12:29 PM --  Filed Vitals:   03/01/15 0900 03/01/15 1100  BP: 99/69 110/44  Pulse: 78   Temp:    Resp: 19     Intake/Output Summary (Last 24 hours) at 03/01/15 1229 Last data filed at 03/01/15 0900  Gross per 24 hour  Intake  275.4 ml  Output   2375 ml  Net -2099.6 ml     Laboratory CBC    Component Value Date/Time   WBC 8.4 02/28/2015 0027   WBC 7.6 01/28/2014 0437   WBC 5.9 04/19/2013 1652   HGB 9.5* 02/28/2015 0027   HGB 9.3* 01/28/2014 0437   HCT 29.9* 02/28/2015 0027   HCT 29.2* 01/28/2014 0437   PLT 235 02/28/2015 0027   PLT 255 01/28/2014 0437    BMET    Component Value Date/Time   NA 139 02/28/2015 1356   NA 140 01/28/2014 0437   NA 139 04/19/2013 1652   K 3.8 02/28/2015 1356   K 3.3* 01/28/2014 0437   CL 99* 02/28/2015 1356   CL 106 01/28/2014 0437   CO2 33* 02/28/2015 1356   CO2 25 01/28/2014 0437   GLUCOSE 109* 02/28/2015 1356   GLUCOSE 87 01/28/2014 0437   GLUCOSE 110* 04/19/2013 1652   BUN 15 02/28/2015 1356   BUN 21* 05/08/2014 1052   BUN 21 04/19/2013 1652   CREATININE 0.73 02/28/2015 1356   CREATININE 0.74 05/08/2014 1052   CALCIUM 8.6* 02/28/2015 1356   CALCIUM 9.2 01/28/2014 0437   GFRNONAA >60 02/28/2015 1356   GFRNONAA >60 05/08/2014 1052   GFRNONAA >60 01/28/2014 0437   GFRAA >60 02/28/2015 1356   GFRAA >60 05/08/2014 1052   GFRAA >60 01/28/2014 0437    COAG Lab Results  Component Value Date   INR 1.96 02/27/2015   INR 1.1 05/09/2013   INR 1.4 05/08/2013   No results found for: PTT  Antibiotics Anti-infectives    Start     Dose/Rate  Route Frequency Ordered Stop   02/28/15 1400  piperacillin-tazobactam (ZOSYN) IVPB 3.375 g     3.375 g 12.5 mL/hr over 240 Minutes Intravenous 3 times per day 02/28/15 1335     02/28/15 0830  cefUROXime (ZINACEF) 1.5 g in dextrose 5 % 50 mL IVPB     1.5 g 100 mL/hr over 30 Minutes Intravenous  Once 02/28/15 0823 02/28/15 0854       V. Leia Alf, M.D. [facility] Office: [phone] Pager:  [phone]

## 2015-03-01 NOTE — Progress Notes (Addendum)
Pt. AO. Very HOH. PAD remained at a level 1 throughout the night. Pedal pulses detectable w/doppler. O2 4L [place], sats 90's. SR w/rate in the 80's. Pt has been hypotensive through night. 500 mL bolus given. No new orders for BP at this time.

## 2015-03-01 NOTE — Progress Notes (Signed)
MD called, verbal approval given to get pt up to bedside commode.

## 2015-03-01 NOTE — Progress Notes (Signed)
Notified Dr. Olevia Bowens about pts. Persistent hypotension. 500 mL bolus ordered. Toradol given. Hold Fentanyl unless systolic [id number]

## 2015-03-01 NOTE — Progress Notes (Signed)
Notified Dr. Olevia Bowens of pts low BP after 500 mL bolus. No new orders given. Will continue to watch BP.

## 2015-03-02 LAB — CBC
HEMATOCRIT: 23.7 % — AB (ref 35.0–47.0)
Hemoglobin: 7.8 g/dL — ABNORMAL LOW (ref 12.0–16.0)
MCH: 27 pg (ref 26.0–34.0)
MCHC: 33 g/dL (ref 32.0–36.0)
MCV: 81.7 fL (ref 80.0–100.0)
PLATELETS: 188 10*3/uL (ref 150–440)
RBC: 2.9 MIL/uL — AB (ref 3.80–5.20)
RDW: 17.6 % — ABNORMAL HIGH (ref 11.5–14.5)
WBC: 6.9 10*3/uL (ref 3.6–11.0)

## 2015-03-02 LAB — BASIC METABOLIC PANEL
ANION GAP: 8 (ref 5–15)
BUN: 17 mg/dL (ref 6–20)
CO2: 34 mmol/L — ABNORMAL HIGH (ref 22–32)
Calcium: 8.9 mg/dL (ref 8.9–10.3)
Chloride: 98 mmol/L — ABNORMAL LOW (ref 101–111)
Creatinine, Ser: 0.8 mg/dL (ref 0.44–1.00)
GFR calc Af Amer: >60 mL/min (ref >60)
GLUCOSE: 100 mg/dL — AB (ref 65–99)
POTASSIUM: 3.5 mmol/L (ref 3.5–5.1)
Sodium: 140 mmol/L (ref 135–145)

## 2015-03-02 MED ORDER — FUROSEMIDE 10 MG/ML IJ SOLN
40.0000 mg | Freq: Once | INTRAMUSCULAR | Status: AC
  Administered 2015-03-02: 40 mg via INTRAVENOUS
  Filled 2015-03-02: qty 4

## 2015-03-02 MED ORDER — BISACODYL 10 MG RE SUPP
10.0000 mg | Freq: Once | RECTAL | Status: AC
  Administered 2015-03-02: 10 mg via RECTAL
  Filled 2015-03-02: qty 1

## 2015-03-02 MED ORDER — FUROSEMIDE 40 MG PO TABS
40.0000 mg | ORAL_TABLET | Freq: Every day | ORAL | Status: DC
  Administered 2015-03-03: 40 mg via ORAL
  Filled 2015-03-02: qty 1

## 2015-03-02 MED ORDER — DILTIAZEM HCL ER COATED BEADS 120 MG PO CP24
120.0000 mg | ORAL_CAPSULE | Freq: Every day | ORAL | Status: DC
  Administered 2015-03-02 – 2015-03-03 (×2): 120 mg via ORAL
  Filled 2015-03-02 (×2): qty 1

## 2015-03-02 MED ORDER — LACTULOSE 10 GM/15ML PO SOLN
30.0000 g | Freq: Once | ORAL | Status: AC
  Administered 2015-03-02: 30 g via ORAL
  Filled 2015-03-02: qty 60

## 2015-03-02 MED ORDER — PANTOPRAZOLE SODIUM 40 MG PO TBEC
40.0000 mg | DELAYED_RELEASE_TABLET | Freq: Every day | ORAL | Status: DC
  Administered 2015-03-02 – 2015-03-03 (×2): 40 mg via ORAL
  Filled 2015-03-02 (×2): qty 1

## 2015-03-02 MED ORDER — POTASSIUM CHLORIDE CRYS ER 20 MEQ PO TBCR
40.0000 meq | EXTENDED_RELEASE_TABLET | Freq: Once | ORAL | Status: AC
  Administered 2015-03-02: 40 meq via ORAL
  Filled 2015-03-02: qty 2

## 2015-03-02 NOTE — Evaluation (Signed)
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  Pt was also having general weakness, cough and shortness of breath.  Clinical Impression  Pt eager to participate with PT and was able to ambulate w/o LOBs.  She did have significant drop in O2 with the effort (on room air, typically only uses O2 at night).  She needed some minimal assist with bed mobility but was safe and relatively confident.  Her biggest issues was activity tolerance, she would struggle to go back home given her current status, but is she improves she would rather no do rehab.     Follow Up Recommendations SNF (per progress)    Equipment Recommendations   (pt may need 24/7 O2?, now night time only)    Recommendations for Other Services       Precautions / Restrictions Precautions Precautions: Fall Restrictions Weight Bearing Restrictions: No      Mobility  Bed Mobility Overal bed mobility: Needs Assistance Bed Mobility: Sit to Supine       Sit to supine: Min assist   General bed mobility comments: Pt needs light assist with getting LEs up into bed, she shows good effort and is able to scoot up in bed with hand rails  Transfers Overall transfer level: Modified independent Equipment used: Rolling walker (2 wheeled)             General transfer comment: Pt able to get to standing with light walker use and generally showing good confidence and safety,.  Ambulation/Gait Ambulation/Gait assistance: Min assist Ambulation Distance (Feet): 40 Feet Assistive device: Rolling walker (2 wheeled)       General Gait Details: Pt did well with balance and cadence during ambulation but she does become fatigued.  Her O2 sats were in the high 90s on O2 and stayed in the mid 90s on room  air with light activities.  However with ambulation her sats dropped (quickly) to the low 80s and it took her some effort to get back into the 90s on 2 liters supplemental O2  Stairs            Wheelchair Mobility    Modified Rankin (Stroke Patients Only)       Balance                                             Pertinent Vitals/Pain Pain Assessment: No/denies pain    Home Living Family/patient expects to be discharged to:: Skilled nursing facility Living Arrangements: Spouse/significant other               Additional Comments: Live at [facility] independent living    Prior Function Level of Independence: Independent         Comments: Pt reports that she is able to be relatively active and does chores, errands, etc     Hand Dominance        Extremity/Trunk Assessment   Upper Extremity Assessment: Generalized weakness (age appropriate limitations)           Lower Extremity Assessment: Generalized weakness;Overall [facility] for tasks assessed         Communication   Communication: HOH (has cochlear implant)  Cognition Arousal/Alertness: Awake/alert Behavior During Therapy: WFL for tasks assessed/performed Overall Cognitive Status: Within Functional Limits for tasks assessed                      General Comments      Exercises        Assessment/Plan    PT Assessment Patient needs continued PT services  PT Diagnosis Difficulty walking;Generalized weakness   PT Problem List Decreased strength;Decreased range of motion;Decreased activity tolerance;Decreased balance;Decreased mobility;Decreased safety awareness  PT Treatment Interventions DME instruction;Gait training;Functional mobility training;Therapeutic activities;Therapeutic exercise;Balance training;Neuromuscular re-education;Patient/family education   PT Goals (Current goals can be found in the Care Plan section) Acute Rehab PT Goals Patient Stated Goal: "I  would rather go home than rehab if I can" PT Goal Formulation: With patient Time For Goal Achievement: 03/16/15    Frequency Min 2X/week   Barriers to discharge        Co-evaluation               End of Session Equipment Utilized During Treatment: Gait belt;Oxygen Activity Tolerance: Patient limited by fatigue Patient left: in bed;with nursing/sitter in room           Time: 1320-1345 PT Time Calculation (min) (ACUTE ONLY): 25 min   Charges:   PT Evaluation $PT Eval Moderate Complexity: 1 Procedure     PT G Codes:       Wayne Both, PT, DPT [phone]  Kreg Shropshire 03/02/2015, 4:20 PM

## 2015-03-02 NOTE — Progress Notes (Signed)
    Subjective  - POD #2  C/o cough with blood tinge Legs feel better.  No pain.  Does worry about a little swelling in left leg   Physical Exam:  On O2 Legs warm with doppler AT Trace edema, L LE       Assessment/Plan:  POD #2  GI:  No stool.  Will try suppository today OOB and ambulate, may need rehab con't abx for pneumonia.  Try to wean O2 Foley d/c'd, spontaneous voiding Acute blood loss anemia:  HCT 23 today.  Asx, repeat in am  Frazer [place], [place] 03/02/2015 12:40 PM --  Filed Vitals:   03/02/15 0800 03/02/15 1000  BP: 98/39 125/42  Pulse: 73 94  Temp: 98.3 F (36.8 C)   Resp: 17 21    Intake/Output Summary (Last 24 hours) at 03/02/15 1240 Last data filed at 03/02/15 0600  Gross per 24 hour  Intake    100 ml  Output   2090 ml  Net  -1990 ml     Laboratory CBC    Component Value Date/Time   WBC 6.9 03/02/2015 0525   WBC 7.6 01/28/2014 0437   WBC 5.9 04/19/2013 1652   HGB 7.8* 03/02/2015 0525   HGB 9.3* 01/28/2014 0437   HCT 23.7* 03/02/2015 0525   HCT 29.2* 01/28/2014 0437   PLT 188 03/02/2015 0525   PLT 255 01/28/2014 0437    BMET    Component Value Date/Time   NA 140 03/02/2015 0525   NA 140 01/28/2014 0437   NA 139 04/19/2013 1652   K 3.5 03/02/2015 0525   K 3.3* 01/28/2014 0437   CL 98* 03/02/2015 0525   CL 106 01/28/2014 0437   CO2 34* 03/02/2015 0525   CO2 25 01/28/2014 0437   GLUCOSE 100* 03/02/2015 0525   GLUCOSE 87 01/28/2014 0437   GLUCOSE 110* 04/19/2013 1652   BUN 17 03/02/2015 0525   BUN 21* 05/08/2014 1052   BUN 21 04/19/2013 1652   CREATININE 0.80 03/02/2015 0525   CREATININE 0.74 05/08/2014 1052   CALCIUM 8.9 03/02/2015 0525   CALCIUM 9.2 01/28/2014 0437   GFRNONAA >60 03/02/2015 0525   GFRNONAA >60 05/08/2014 1052   GFRNONAA >60 01/28/2014 0437   GFRAA >60 03/02/2015 0525   GFRAA >60 05/08/2014 1052   GFRAA >60 01/28/2014 0437    COAG Lab Results  Component Value Date   INR 1.96 02/27/2015   INR 1.1  05/09/2013   INR 1.4 05/08/2013   No results found for: PTT  Antibiotics Anti-infectives    Start     Dose/Rate Route Frequency Ordered Stop   02/28/15 1400  piperacillin-tazobactam (ZOSYN) IVPB 3.375 g     3.375 g 12.5 mL/hr over 240 Minutes Intravenous 3 times per day 02/28/15 1335     02/28/15 0830  cefUROXime (ZINACEF) 1.5 g in dextrose 5 % 50 mL IVPB     1.5 g 100 mL/hr over 30 Minutes Intravenous  Once 02/28/15 0823 02/28/15 0854       V. Leia Alf, M.D. [facility] Office: [phone] Pager:  [phone]

## 2015-03-02 NOTE — Progress Notes (Signed)
Report called to Mary, RN on 1C.  

## 2015-03-02 NOTE — Plan of Care (Signed)
Pt transferred down from 1C.  Had embolectomy and stent placed in LLE - having pain in mid thigh.  Pt is also diag w/ RUL PNA - currently on zosyn.  Instructed on how to use incentive spirometer.  Pt's Hgb has dropped - don't know if it's dilutional or post procedure - f/u CBC tomorrow.  Pt up to [facility] w/1asst.  Had BM today after not having one since 2/13. Pt's plan is to go home tomorrow.

## 2015-03-02 NOTE — Progress Notes (Signed)
Patient ID: Natasha [name], female   DOB: [birth year], 82 y.o.   MRN: [id number] [facility] PROGRESS NOTE  Natasha [name] [id number] DOB: September 05, 1928 DOA: 02/27/2015 PCP: Rusty Aus., MD  HPI/Subjective: Patient coughed up a little blood. She states her breathing is a little bit better. She still constipated and has not moved her bowels yet. Normally she just wears oxygen at night.  Objective: Filed Vitals:   03/02/15 1000 03/02/15 1200  BP: 125/42 121/44  Pulse: 94 78  Temp:    Resp: 21 17    Filed Weights   02/28/15 0500 03/01/15 0420 03/02/15 0500  Weight: 63.1 kg (139 lb 1.8 oz) 63.4 kg (139 lb 12.4 oz) 63.9 kg (140 lb 14 oz)    ROS: Review of Systems  Constitutional: Negative for fever and chills.  Eyes: Negative for blurred vision.  Respiratory: Positive for cough, shortness of breath and wheezing.   Cardiovascular: Positive for chest pain.  Gastrointestinal: Positive for abdominal pain and constipation. Negative for nausea, vomiting and diarrhea.  Genitourinary: Negative for dysuria.  Musculoskeletal: Negative for joint pain.  Neurological: Negative for dizziness and headaches.   Exam: Physical Exam  Constitutional: She is oriented to person, place, and time.  HENT:  Nose: No mucosal edema.  Mouth/Throat: No oropharyngeal exudate or posterior oropharyngeal edema.  Eyes: Conjunctivae, EOM and lids are normal. Pupils are equal, round, and reactive to light.  Neck: No JVD present. Carotid bruit is not present. No edema present. No thyroid mass and no thyromegaly present.  Cardiovascular: S1 normal and S2 normal.  Exam reveals no gallop.   No murmur heard. Pulses:      Dorsalis pedis pulses are 0 on the right side, and 0 on the left side.  Respiratory: No respiratory distress. She has decreased breath sounds in the right lower field and the left lower field. She has no wheezes. She has no rhonchi. She has rales in the right lower field and  the left lower field.  GI: Soft. Bowel sounds are normal. There is no tenderness.  Musculoskeletal:       Right ankle: She exhibits no swelling.       Left ankle: She exhibits no swelling.  Lymphadenopathy:    She has no cervical adenopathy.  Neurological: She is alert and oriented to person, place, and time. No cranial nerve deficit.  Skin: Skin is warm. No rash noted. Nails show no clubbing.  Psychiatric: She has a normal mood and affect.      Data Reviewed: Basic Metabolic Panel:  Recent Labs Lab 02/27/15 1555 02/28/15 1356 03/02/15 0525  NA 139 139 140  K 3.6 3.8 3.5  CL 97* 99* 98*  CO2 34* 33* 34*  GLUCOSE 101* 109* 100*  BUN 21* 15 17  CREATININE 0.77 0.73 0.80  CALCIUM 10.0 8.6* 8.9  MG 1.6*  --   --   PHOS 4.1  --   --     Recent Labs Lab 02/28/15 1326  AMMONIA 25   CBC:  Recent Labs Lab 02/27/15 1555 02/27/15 2059 02/28/15 0027 03/02/15 0525  WBC 7.3 7.6 8.4 6.9  HGB 11.3* 10.2* 9.5* 7.8*  HCT 35.3 32.0* 29.9* 23.7*  MCV 82.4 83.0 83.4 81.7  PLT 302 228 235 188   CBG:  Recent Labs Lab 02/27/15 2025  GLUCAP 79    Recent Results (from the past 240 hour(s))  MRSA PCR Screening     Status: None   Collection Time: 02/27/15  8:37  PM  Result Value Ref Range Status   MRSA by PCR NEGATIVE NEGATIVE Final    Comment:        The GeneXpert MRSA Assay (FDA approved for NASAL specimens only), is one component of a comprehensive MRSA colonization surveillance program. It is not intended to diagnose MRSA infection nor to guide or monitor treatment for MRSA infections.   CULTURE, BLOOD (ROUTINE X 2) w Reflex to PCR ID Panel     Status: None (Preliminary result)   Collection Time: 02/28/15  1:51 PM  Result Value Ref Range Status   Specimen Description BLOOD RIGHT ANTECUBITAL  Final   Special Requests   Final    BOTTLES DRAWN AEROBIC AND ANAEROBIC  AER 1ML ANA 5ML   Culture NO GROWTH 2 DAYS  Final   Report Status PENDING  Incomplete  CULTURE,  BLOOD (ROUTINE X 2) w Reflex to PCR ID Panel     Status: None (Preliminary result)   Collection Time: 02/28/15  1:52 PM  Result Value Ref Range Status   Specimen Description BLOOD RIGHT WRIST  Final   Special Requests   Final    BOTTLES DRAWN AEROBIC AND ANAEROBIC AER 4ML ANA 1 ML   Culture NO GROWTH 2 DAYS  Final   Report Status PENDING  Incomplete      Scheduled Meds: . bisacodyl  10 mg Rectal Once  . budesonide (PULMICORT) nebulizer solution  0.25 mg Nebulization BID  . diltiazem  120 mg Oral Daily  . furosemide  40 mg Intravenous Once  . gabapentin  300 mg Oral BID  . ipratropium-albuterol  3 mL Nebulization Q6H  . lactulose  30 g Oral Once  . metolazone  2.5 mg Oral Once per day on Mon Thu  . pantoprazole  40 mg Oral Daily  . piperacillin-tazobactam (ZOSYN)  IV  3.375 g Intravenous 3 times per day  . pramipexole  0.5 mg Oral TID  . rivaroxaban  20 mg Oral Daily  . senna  1 tablet Oral BID  . tiotropium  18 mcg Inhalation Daily    Assessment/Plan:  1. Acute respiratory failure with hypoxia. I dial down the oxygen down to 2 L. She wears her oxygen 2 L at night only. Check her pulse ox on room air. 2. Right upper lobe pneumonia. Patient placed on Zosyn which can be switched to oral Augmentin upon discharge home. Wheeze in the lungs has improved with nebulizer treatments 3. Fluid overload- give 40 mg IV Lasix today and restart oral Lasix for tomorrow 3. Peripheral vascular disease. Patient status post procedure by Dr. Lucky Cowboy. Patient on full anticoagulation with Xarelto 4. Essential hypertension blood pressure stable on diltiazem 5. Neuropathy and restless legs on gabapentin and pramipexole. 6. Constipation- give a dose of lactulose today 7. Drop in hemoglobin- patient has not had a bowel movement so this is not a GI bleed. Could be postprocedural and dilutional. Check a CBC tomorrow morning. 8. Weakness- physical therapy evaluation  Code Status:     Code Status Orders         Start     Ordered   02/27/15 1553  Full code   Continuous     02/27/15 1553    Code Status History    Date Active Date Inactive Code Status Order ID Comments User Context   This patient has a current code status but no historical code status.    Advance Directive Documentation        Most Recent Value  Type of Advance Directive  Healthcare Power of Attorney   Pre-existing out of facility DNR order (yellow form or pink MOST form)     "MOST" Form in Place?       Family Communication: Spoke with the husband yesterday Disposition Plan: to be determined  Consultants:  Vascular surgery  Antibiotics:  Zosyn  Time spent: 24 minutes  Loletha Grayer  [facility]

## 2015-03-02 NOTE — Progress Notes (Signed)
Patient moved to room 112 by bed with Hoyle Sauer, orderly. Patient has been A&Ox4, HOH.  Denies pain. NSR.  Dyspnic with exertion only. SpO2 monitor applied to patient prior to leaving ICU and verified with Coralyn Mark, tele clerk. Patient on 2L o2 when leaving ICU.

## 2015-03-03 ENCOUNTER — Inpatient Hospital Stay: Payer: Medicare Other

## 2015-03-03 LAB — CBC
HEMATOCRIT: 26.2 % — AB (ref 35.0–47.0)
Hemoglobin: 8.5 g/dL — ABNORMAL LOW (ref 12.0–16.0)
MCH: 26.8 pg (ref 26.0–34.0)
MCHC: 32.5 g/dL (ref 32.0–36.0)
MCV: 82.4 fL (ref 80.0–100.0)
Platelets: 217 10*3/uL (ref 150–440)
RBC: 3.18 MIL/uL — ABNORMAL LOW (ref 3.80–5.20)
RDW: 17.7 % — AB (ref 11.5–14.5)
WBC: 6.8 10*3/uL (ref 3.6–11.0)

## 2015-03-03 LAB — POTASSIUM: POTASSIUM: 3.9 mmol/L (ref 3.5–5.1)

## 2015-03-03 LAB — GLUCOSE, CAPILLARY: GLUCOSE-CAPILLARY: 98 mg/dL (ref 65–99)

## 2015-03-03 MED ORDER — LEVOFLOXACIN 750 MG PO TABS: 750.0000 mg | ORAL_TABLET | Freq: Every day | ORAL | Status: DC

## 2015-03-03 MED ORDER — LEVOFLOXACIN 750 MG PO TABS
750.0000 mg | ORAL_TABLET | ORAL | Status: DC
Start: 2015-03-03 — End: 2015-03-03
  Administered 2015-03-03: 750 mg via ORAL
  Filled 2015-03-03: qty 1

## 2015-03-03 NOTE — Progress Notes (Signed)
[facility]  Daily Progress Note  Subjective: 3 Days Post-Op: Left Lower Extremity Angiogram with PTA and thrombectomy for occluded bypass.  Patient without complaint with exception of some left lower extremity discomfort however states her leg "feels much better." Interested in rehab at [facility] as she lives there as well. Denies any chest pain or SOB.   Objective: Filed Vitals:   03/02/15 1650 03/02/15 2031 03/03/15 0500 03/03/15 0540  BP: 150/47   137/48  Pulse: 81   73  Temp: 98.2 F (36.8 C)   98.9 F (37.2 C)  TempSrc: Oral   Oral  Resp: 18   18  Height:      Weight:   62.37 kg (137 lb 8 oz)   SpO2: 94% 93%  93%    Intake/Output Summary (Last 24 hours) at 03/03/15 [id number] Last data filed at 03/02/15 2127  Gross per 24 hour  Intake    340 ml  Output    725 ml  Net   -385 ml   Physical Exam: A&Ox3, NAD Neck: Left IJ central line in place - no signs of infection noted CV: RRR Pulmonary: CTA Bilaterally Abdomen: Soft, Nontender, Nondistended Groin: healing, no swelling, no signs of infection Vascular:  Left Lower Extremity: Minimal swelling, warm, somewhat tender to palpation.   Right Lower Extremity: No edema, warm, non-tender   Laboratory: CBC    Component Value Date/Time   WBC 6.8 03/03/2015 0451   WBC 7.6 01/28/2014 0437   WBC 5.9 04/19/2013 1652   HGB 8.5* 03/03/2015 0451   HGB 9.3* 01/28/2014 0437   HCT 26.2* 03/03/2015 0451   HCT 29.2* 01/28/2014 0437   PLT 217 03/03/2015 0451   PLT 255 01/28/2014 0437   BMET    Component Value Date/Time   NA 140 03/02/2015 0525   NA 140 01/28/2014 0437   NA 139 04/19/2013 1652   K 3.9 03/03/2015 0451   K 3.3* 01/28/2014 0437   CL 98* 03/02/2015 0525   CL 106 01/28/2014 0437   CO2 34* 03/02/2015 0525   CO2 25 01/28/2014 0437   GLUCOSE 100* 03/02/2015 0525   GLUCOSE 87 01/28/2014 0437   GLUCOSE 110* 04/19/2013 1652   BUN 17 03/02/2015 0525   BUN 21* 05/08/2014 1052   BUN 21 04/19/2013  1652   CREATININE 0.80 03/02/2015 0525   CREATININE 0.74 05/08/2014 1052   CALCIUM 8.9 03/02/2015 0525   CALCIUM 9.2 01/28/2014 0437   GFRNONAA >60 03/02/2015 0525   GFRNONAA >60 05/08/2014 1052   GFRNONAA >60 01/28/2014 0437   GFRAA >60 03/02/2015 0525   GFRAA >60 05/08/2014 1052   GFRAA >60 01/28/2014 0437   Assessment/Planning: 3 Days Post-Op: Left Lower Extremity Angiogram with PTA and thrombectomy for occluded bypass - stable 1) Change Zosyn to Levaquin 2) CXR for CAP 3) Rehab placement as per case management 4) Discussed with Dr. Ellis Parents Kenedy Haisley PA-C 03/03/2015 8:32 AM

## 2015-03-03 NOTE — Plan of Care (Signed)
Pt states improvement.  Has some cough and exp wheezes posteriorly.  Using walker to BR- experiences some weakness.  Pt desatted in the 70's off of O2.  So will be d/ced on O2.  IV Lasix given for fluid overload - will start on oral tomorrow.  Chest xray today shows significantly improved RUL PNA;  Stable large hiatial hernia and mild chronic interstitial lung disease.  Pt will be on course of Levaquin for 5 days. Hgb improved to 8.5 from 7.8 and K+ was 3.9 after numerous stools yesterday after several laxatives. Pt d/ced to [facility] will pick pt up at 3 pm.

## 2015-03-03 NOTE — Progress Notes (Addendum)
Patient ID: Natasha [name], female   DOB: 1928-01-29, 82 y.o.   MRN: [id number] [facility] PROGRESS NOTE  IN [place] [id number] DOB: 24-Sep-1928 DOA: 02/27/2015 PCP: Rusty Aus., MD  HPI/Subjective: Patient feeling better today, breathing fine, some cough, weak with physical therapy. Had a bowel movement.  Objective: Filed Vitals:   03/02/15 1650 03/03/15 0540  BP: 150/47 137/48  Pulse: 81 73  Temp: 98.2 F (36.8 C) 98.9 F (37.2 C)  Resp: 18 18    Filed Weights   03/01/15 0420 03/02/15 0500 03/03/15 0500  Weight: 63.4 kg (139 lb 12.4 oz) 63.9 kg (140 lb 14 oz) 62.37 kg (137 lb 8 oz)    ROS: Review of Systems  Constitutional: Negative for fever and chills.  Eyes: Negative for blurred vision.  Respiratory: Positive for cough. Negative for shortness of breath and wheezing.   Cardiovascular: Negative for chest pain.  Gastrointestinal: Positive for abdominal pain. Negative for nausea, vomiting, diarrhea and constipation.  Genitourinary: Negative for dysuria.  Musculoskeletal: Negative for joint pain.  Neurological: Negative for dizziness and headaches.   Exam: Physical Exam  Constitutional: She is oriented to person, place, and time.  HENT:  Nose: No mucosal edema.  Mouth/Throat: No oropharyngeal exudate or posterior oropharyngeal edema.  Eyes: Conjunctivae, EOM and lids are normal. Pupils are equal, round, and reactive to light.  Neck: No JVD present. Carotid bruit is not present. No edema present. No thyroid mass and no thyromegaly present.  Cardiovascular: S1 normal and S2 normal.  Exam reveals no gallop.   No murmur heard. Pulses:      Dorsalis pedis pulses are 0 on the right side, and 0 on the left side.  Respiratory: No respiratory distress. She has decreased breath sounds in the right lower field and the left lower field. She has no wheezes. She has no rhonchi. She has rales in the right lower field and the left lower field.  GI:  Soft. Bowel sounds are normal. There is no tenderness.  Musculoskeletal:       Right ankle: She exhibits no swelling.       Left ankle: She exhibits no swelling.  Lymphadenopathy:    She has no cervical adenopathy.  Neurological: She is alert and oriented to person, place, and time. No cranial nerve deficit.  Skin: Skin is warm. No rash noted. Nails show no clubbing.  Psychiatric: She has a normal mood and affect.      Data Reviewed: Basic Metabolic Panel:  Recent Labs Lab 02/27/15 1555 02/28/15 1356 03/02/15 0525 03/03/15 0451  NA 139 139 140  --   K 3.6 3.8 3.5 3.9  CL 97* 99* 98*  --   CO2 34* 33* 34*  --   GLUCOSE 101* 109* 100*  --   BUN 21* 15 17  --   CREATININE 0.77 0.73 0.80  --   CALCIUM 10.0 8.6* 8.9  --   MG 1.6*  --   --   --   PHOS 4.1  --   --   --     Recent Labs Lab 02/28/15 1326  AMMONIA 25   CBC:  Recent Labs Lab 02/27/15 1555 02/27/15 2059 02/28/15 0027 03/02/15 0525 03/03/15 0451  WBC 7.3 7.6 8.4 6.9 6.8  HGB 11.3* 10.2* 9.5* 7.8* 8.5*  HCT 35.3 32.0* 29.9* 23.7* 26.2*  MCV 82.4 83.0 83.4 81.7 82.4  PLT 302 228 235 188 217   CBG:  Recent Labs Lab 02/27/15 2025 03/03/15 0731  GLUCAP 79 98    Recent Results (from the past 240 hour(s))  MRSA PCR Screening     Status: None   Collection Time: 02/27/15  8:37 PM  Result Value Ref Range Status   MRSA by PCR NEGATIVE NEGATIVE Final    Comment:        The GeneXpert MRSA Assay (FDA approved for NASAL specimens only), is one component of a comprehensive MRSA colonization surveillance program. It is not intended to diagnose MRSA infection nor to guide or monitor treatment for MRSA infections.   CULTURE, BLOOD (ROUTINE X 2) w Reflex to PCR ID Panel     Status: None (Preliminary result)   Collection Time: 02/28/15  1:51 PM  Result Value Ref Range Status   Specimen Description BLOOD RIGHT ANTECUBITAL  Final   Special Requests   Final    BOTTLES DRAWN AEROBIC AND ANAEROBIC  AER  1ML ANA 5ML   Culture NO GROWTH 3 DAYS  Final   Report Status PENDING  Incomplete  CULTURE, BLOOD (ROUTINE X 2) w Reflex to PCR ID Panel     Status: None (Preliminary result)   Collection Time: 02/28/15  1:52 PM  Result Value Ref Range Status   Specimen Description BLOOD RIGHT WRIST  Final   Special Requests   Final    BOTTLES DRAWN AEROBIC AND ANAEROBIC AER 4ML ANA 1 ML   Culture NO GROWTH 3 DAYS  Final   Report Status PENDING  Incomplete      Scheduled Meds: . budesonide (PULMICORT) nebulizer solution  0.25 mg Nebulization BID  . diltiazem  120 mg Oral Daily  . furosemide  40 mg Oral Daily  . gabapentin  300 mg Oral BID  . ipratropium-albuterol  3 mL Nebulization Q6H  . levofloxacin  750 mg Oral Q48H  . metolazone  2.5 mg Oral Once per day on Mon Thu  . pantoprazole  40 mg Oral Daily  . pramipexole  0.5 mg Oral TID  . rivaroxaban  20 mg Oral Daily  . senna  1 tablet Oral BID  . tiotropium  18 mcg Inhalation Daily    Assessment/Plan:  1. Acute respiratory failure with hypoxia, now chronic respiatory failure- will need oxuygen 24/7 2. Right upper lobe pneumonia. abx changed to high dose Levaquin. Prescibe for 5 day course 3. Fluid overload- give 40 mg IV Lasix today and restart oral Lasix for tomorrow 3. Peripheral vascular disease. Patient status post procedure by Dr. Lucky Cowboy. Patient on full anticoagulation with Xarelto 4. Essential hypertension blood pressure stable on diltiazem 5. Neuropathy and restless legs on gabapentin and pramipexole. 6. Constipation- give a dose of lactulose today 7. Drop in hemoglobin- hb up this am 8. Weakness- physical therapy rec rehab  Patient medically stable to go to rehab when bed available  Code Status:     Code Status Orders        Start     Ordered   02/27/15 1553  Full code   Continuous     02/27/15 1553    Code Status History    Date Active Date Inactive Code Status Order ID Comments User Context   This patient has a current  code status but no historical code status.    Advance Directive Documentation        Most Recent Value   Type of Advance Directive  Healthcare Power of Attorney   Pre-existing out of facility DNR order (yellow form or pink MOST form)     "MOST"  Form in Place?       Disposition Plan: rehab  Consultants:  Vascular surgery  Antibiotics:  Po levaquin  Time spent: 23 minutes  Loletha Grayer  [facility]

## 2015-03-03 NOTE — Progress Notes (Signed)
Levofloxacin dose adjusted to every 48 hrs for renal function per protocol. Prudy Feeler [facility] 03/03/2015 8:38 AM

## 2015-03-03 NOTE — Plan of Care (Signed)
Called report to Festus Aloe, RN at [facility] [phone].  Removing RIJ.

## 2015-03-03 NOTE — Care Management Important Message (Signed)
Important Message  Patient Details  Name: Natasha [name] MRN: [id number] Date of Birth: Jun 24, 1928   Medicare Important Message Given:  Yes    Juliann Pulse A Aundra Espin 03/03/2015, 11:27 AM

## 2015-03-03 NOTE — Clinical Social Work Placement (Signed)
   CLINICAL SOCIAL WORK PLACEMENT  NOTE  Date:  03/03/2015  Patient Details  Name: Natasha [name] MRN: [id number] Date of Birth: Oct 26, 1928  Clinical Social Work is seeking post-discharge placement for this patient at the [place] level of care (*CSW will initial, date and re-position this form in  chart as items are completed):  Yes   Patient/family provided with [place] Work Department's list of facilities offering this level of care within the geographic area requested by the patient (or if unable, by the patient's family).  Yes   Patient/family informed of their freedom to choose among providers that offer the needed level of care, that participate in Medicare, Medicaid or managed care program needed by the patient, have an available bed and are willing to accept the patient.  Yes   Patient/family informed of [place]'s ownership interest in [facility] and [facility], as well as of the fact that they are under no obligation to receive care at these facilities.  PASRR submitted to EDS on       PASRR number received on       Existing PASRR number confirmed on 03/03/15     FL2 transmitted to all facilities in geographic area requested by pt/family on 03/03/15     FL2 transmitted to all facilities within larger geographic area on       Patient informed that his/her managed care company has contracts with or will negotiate with certain facilities, including the following:        Yes   Patient/family informed of bed offers received.  Patient chooses bed at [facility]     Physician recommends and patient chooses bed at  [facility])    Patient to be transferred to [facility] on 03/03/15.  Patient to be transferred to facility by [facility]     Patient family notified on 03/03/15 of transfer.  Name of family member notified:  Evette Doffing, spouse     PHYSICIAN       Additional Comment:     _______________________________________________ Darden Dates, LCSW 03/03/2015, 12:43 PM

## 2015-03-03 NOTE — NC FL2 (Signed)
[place] LEVEL OF CARE SCREENING TOOL     IDENTIFICATION  Patient Name: Natasha [name] Birthdate: May 25, 1928 Sex: female Admission Date (Current Location): 02/27/2015  [place] and [place] Number:  Engineering geologist and Address:  [facility], [address] [place], [place] [address]      Provider Number: [id number]  Attending Physician Name and Address:  Algernon Huxley, MD  Relative Name and Phone Number:       Current Level of Care: Hospital Recommended Level of Care:   Prior Approval Number:    Date Approved/Denied:   PASRR Number: [id number]  Discharge Plan: SNF    Current Diagnoses: Patient Active Problem List   Diagnosis Date Noted  . Lower extremity atheroembolism ([place]) 02/27/2015  . Ischemic leg 02/27/2015  . Calculus of gallbladder with other cholecystitis, without mention of obstruction 04/20/2013    Orientation RESPIRATION BLADDER Height & Weight     Self, Time, Situation, Place  O2 (2L) Continent Weight: 137 lb 8 oz (62.37 kg) Height:  4\' 11"  (149.9 cm)  BEHAVIORAL SYMPTOMS/MOOD NEUROLOGICAL BOWEL NUTRITION STATUS      Continent    AMBULATORY STATUS COMMUNICATION OF NEEDS Skin   Limited Assist Verbally Normal                       Personal Care Assistance Level of Assistance  Bathing, Feeding, Dressing Bathing Assistance: Limited assistance Feeding assistance: Independent Dressing Assistance: Limited assistance     Functional Limitations Info  Sight, Hearing, Speech Sight Info: Adequate Hearing Info: Adequate Speech Info: Adequate    SPECIAL CARE FACTORS FREQUENCY  PT (By licensed PT)     PT Frequency: 5              Contractures      Additional Factors Info  Code Status, Allergies Code Status Info: Full Code Allergies Info: Allergies: Codeine, Hydrocodone, Levofloxacin In D5w, Tramadol           Current Medications (03/03/2015):  This is the current hospital active  medication list Current Facility-Administered Medications  Medication Dose Route Frequency Provider Last Rate Last Dose  . acetaminophen (TYLENOL) tablet 650 mg  650 mg Oral Q6H PRN Algernon Huxley, MD   650 mg at 03/02/15 0540   Or  . acetaminophen (TYLENOL) suppository 650 mg  650 mg Rectal Q6H PRN Algernon Huxley, MD      . ALPRAZolam Duanne Moron) tablet 0.25 mg  0.25 mg Oral TID PRN Gladstone Lighter, MD   0.25 mg at 02/28/15 1410  . alum & mag hydroxide-simeth (MAALOX/MYLANTA) 200-200-20 MG/5ML suspension 30 mL  30 mL Oral Q6H PRN Algernon Huxley, MD   30 mL at 02/28/15 2239  . budesonide (PULMICORT) nebulizer solution 0.25 mg  0.25 mg Nebulization BID Loletha Grayer, MD   0.25 mg at 03/03/15 0834  . diltiazem (CARDIZEM CD) 24 hr capsule 120 mg  120 mg Oral Daily Loletha Grayer, MD   120 mg at 03/02/15 1000  . furosemide (LASIX) tablet 40 mg  40 mg Oral Daily Loletha Grayer, MD      . gabapentin (NEURONTIN) capsule 300 mg  300 mg Oral BID Janalyn Harder Stegmayer, PA-C   300 mg at 03/02/15 2126  . HYDROmorphone (DILAUDID) injection 1 mg  1 mg Intravenous Q30 min PRN Algernon Huxley, MD   1 mg at 02/28/15 1245  . HYDROmorphone (DILAUDID) tablet 2 mg  2 mg Oral TID PRN Joelene Millin  A Stegmayer, PA-C   2 mg at 03/01/15 2214  . ipratropium-albuterol (DUONEB) 0.5-2.5 (3) MG/3ML nebulizer solution 3 mL  3 mL Nebulization Q6H Loletha Grayer, MD   3 mL at 03/03/15 0834  . levofloxacin (LEVAQUIN) tablet 750 mg  750 mg Oral [address] [place], [facility]      . metolazone (ZAROXOLYN) tablet 2.5 mg  2.5 mg Oral Once per day on Mon Thu Janalyn Harder Stegmayer, PA-C   2.5 mg at 02/27/15 2115  . ondansetron (ZOFRAN) tablet 4 mg  4 mg Oral Q6H PRN Algernon Huxley, MD       Or  . ondansetron [facility]) injection 4 mg  4 mg Intravenous Q6H PRN Algernon Huxley, MD   4 mg at 02/28/15 1258  . pantoprazole (PROTONIX) EC tablet 40 mg  40 mg Oral Daily Serafina Mitchell, MD   40 mg at 03/02/15 0952  . pramipexole (MIRAPEX) tablet 0.5 mg  0.5 mg Oral TID Reubin Milan, MD   0.5 mg at 03/02/15 2126  . rivaroxaban (XARELTO) tablet 20 mg  20 mg Oral Daily Algernon Huxley, MD   20 mg at 03/02/15 [phone]  . senna (SENOKOT) tablet 8.6 mg  1 tablet Oral BID Serafina Mitchell, MD   8.6 mg at 03/02/15 0955  . tiotropium (SPIRIVA) inhalation capsule 18 mcg  18 mcg Inhalation Daily Vaughan Basta, MD   18 mcg at 03/02/15 [id number]     Discharge Medications: Please see discharge summary for a list of discharge medications.  Relevant Imaging Results:  Relevant Lab Results:   Additional Information SSN:  [id number]  Darden Dates, LCSW

## 2015-03-03 NOTE — Discharge Summary (Signed)
[place]    Discharge Summary    Patient ID:  Natasha [name] MRN: [id number] DOB/AGE: Mar 07, 1928 82 y.o.  Admit date: 02/27/2015 Discharge date: 03/03/2015 Date of Surgery: 02/27/2015 - 02/28/2015 Surgeon: Juliann Mule): Algernon Huxley, MD  Admission Diagnosis: Occlusion of bypass ESRD ischemic leg  Discharge Diagnoses:  Occlusion of bypass ESRD ischemic leg  Secondary Diagnoses: Past Medical History  Diagnosis Date  . GERD (gastroesophageal reflux disease)   . Asthma   . Anemia   . Arthritis   . H/O blood clots 2014  . Gallstones 2015  . Hypertension     Procedure(s): Lower Extremity Angiography Lower Extremity Intervention  Discharged Condition: good  HPI:  Patient presented with a history of pain in her left leg that started on 02/27/15. She and her husband described that her leg has been twitchy and Tingly for several days now, but not overtly painful. She has an extensive history of vascular disease and status post femoral to distal bypass some years ago. She has chronic swelling in that leg with lymphedema that has been reasonably stable. This pain was a new finding. There is no clear inciting event or causative factor. There is no trauma or injury. The patient denies any fevers or chills. A duplex performed in the clinic revealed occlusion of her bypass graft in the left lower extremity. She is admitted for an attempt at salvaging the bypass graft and she would have ischemia that would likely require an amputation without.   On 02/27/15, the patient underwent Ultrasound guidance for vascular access right femoral artery, catheter placement into left anterior tibial artery from right femoral approach, aortogram and selective left lower extremity angiogram, percutaneous transluminal angioplasty of distal bypass anastomosis and proximal anterior tibial artery with 3 mm diameter angioplasty balloon, catheter directed thrombolysis with 4 mg of TPA  delivered throughout the femoral to distal bypass graft and proximal anterior tibial artery, mechanical thrombectomy using the Penumbra CAT 6 device to the left femoral to distal bypass and proximal anterior tibial artery with placement of infusion catheter for overnight thrombolysis.  On 02/27/14, the patient underwent an angiogram through existing catheter left lower extremity, catheter placement into left dorsalis pedis artery from right femoral approach, selective left lower extremity angiogram, percutaneous transluminal angioplasty of distal anterior tibial artery and dorsalis pedis artery with 2 mm diameter angioplasty balloon, percutaneous transluminal angioplasty of distal anastomosis and proximal anterior tibial artery with 3.5 mm diameter angioplasty balloon, mechanical rheolytic thrombectomy to the mid to distal bypass graft, distal bypass anastomosis to the anterior tibial artery, and distal anterior tibial artery with the AngioJet Omni catheter for about 223 cc of effluent returned, StarClose closure device right femoral artery.  The patient tolerated both procedures without complication. Upon discharge, her bypass is open with appropriate perfusion her left extremity.   Post-procedure the patient had an episode of shortness of breath and was diagnosed with community acquired pneumonia by internal medicine consult. She was treated with IV zosyn and transitioned to PO Levaquin.  Upon discharge, the patient was afebrile and denied any chest pain or SOB.  Hospital Course:  Natasha [name] is a 82 y.o. female is S/P Left Procedure(s): Lower Extremity Angiography Lower Extremity Intervention Extubated: POD # 0  Physical exam:  A&Ox3, NAD Neck: Left IJ central line in place - no signs of infection noted CV: RRR Pulmonary: CTA Bilaterally Abdomen: Soft, Nontender, Nondistended Groin: healing, no swelling, no signs of infection Vascular: Left Lower Extremity: Minimal  swelling, warm, somewhat tender to palpation.  Right Lower Extremity: No edema, warm, non-tender  Post-op wounds clean, dry, intact or healing well Pt. Ambulating, voiding and taking PO diet without difficulty. Pt pain controlled with PO pain meds. Labs as below Complications:none  Consults:  Treatment Team:  Loletha Grayer, MD  Significant Diagnostic Studies: CBC Lab Results  Component Value Date   WBC 6.8 03/03/2015   HGB 8.5* 03/03/2015   HCT 26.2* 03/03/2015   MCV 82.4 03/03/2015   PLT 217 03/03/2015    BMET    Component Value Date/Time   NA 140 03/02/2015 0525   NA 140 01/28/2014 0437   NA 139 04/19/2013 1652   K 3.9 03/03/2015 0451   K 3.3* 01/28/2014 0437   CL 98* 03/02/2015 0525   CL 106 01/28/2014 0437   CO2 34* 03/02/2015 0525   CO2 25 01/28/2014 0437   GLUCOSE 100* 03/02/2015 0525   GLUCOSE 87 01/28/2014 0437   GLUCOSE 110* 04/19/2013 1652   BUN 17 03/02/2015 0525   BUN 21* 05/08/2014 1052   BUN 21 04/19/2013 1652   CREATININE 0.80 03/02/2015 0525   CREATININE 0.74 05/08/2014 1052   CALCIUM 8.9 03/02/2015 0525   CALCIUM 9.2 01/28/2014 0437   GFRNONAA >60 03/02/2015 0525   GFRNONAA >60 05/08/2014 1052   GFRNONAA >60 01/28/2014 0437   GFRAA >60 03/02/2015 0525   GFRAA >60 05/08/2014 1052   GFRAA >60 01/28/2014 0437   COAG Lab Results  Component Value Date   INR 1.96 02/27/2015   INR 1.1 05/09/2013   INR 1.4 05/08/2013     Disposition:  Discharge to :Rehab    Medication List    STOP taking these medications        oxyCODONE-acetaminophen 5-325 MG tablet  Commonly known as:  PERCOCET/ROXICET      TAKE these medications        albuterol 108 (90 Base) MCG/ACT inhaler  Commonly known as:  PROVENTIL HFA;VENTOLIN HFA  Inhale 1-2 puffs into the lungs 2 (two) times daily.     CALCIUM 1200+D3 PO  Take by mouth.     cilostazol 100 MG tablet  Commonly known as:  PLETAL  Take 200 mg by mouth 2 (two) times daily. Reported  on 02/27/2015     diltiazem 180 MG 24 hr capsule  Commonly known as:  TIAZAC  Take 1 capsule by mouth daily.     esomeprazole 10 MG packet  Commonly known as:  NEXIUM  Take 10 mg by mouth daily before breakfast.     gabapentin 300 MG capsule  Commonly known as:  NEURONTIN  Take 300 mg by mouth 2 (two) times daily.     HYDROmorphone 2 MG tablet  Commonly known as:  DILAUDID  Take 1 tablet (2 mg total) by mouth 3 (three) times daily as needed for severe pain.     levofloxacin 750 MG tablet  Commonly known as:  LEVAQUIN  Take 1 tablet (750 mg total) by mouth daily.     metolazone 2.5 MG tablet  Commonly known as:  ZAROXOLYN  Take 2.5 mg by mouth 2 (two) times a week.     OXYGEN  Inhale into the lungs.     rivaroxaban 20 MG Tabs tablet  Commonly known as:  XARELTO  Take 20 mg by mouth daily with supper.       Verbal and written Discharge instructions given to the patient. Wound care per Discharge AVS Follow-up Information    Follow  up with DEW,JASON, MD In 3 weeks.   Specialties:  Vascular Surgery, Radiology, Interventional Cardiology   Why:  ABI and Left Lower Extremity Arterial Duplex   Contact information:   [place] [address] [phone]      Levaquin x 7 days (Started 03/03/15) - End Date 03/10/15. Patient will need chronic oxygen therapy (2L [place]). Regular Diet  Signed: Janalyn Harder Mechel Schutter, PA-C  03/03/2015, 11:27 AM

## 2015-03-03 NOTE — Progress Notes (Signed)
ANTIBIOTIC CONSULT NOTE - INITIAL  Pharmacy Consult for levofloxacin Indication: pneumonia  Allergies  Allergen Reactions  . Codeine Diarrhea  . Hydrocodone   . Levofloxacin In D5w Other (See Comments)    Pt denies, questionable  . Tramadol Nausea Only    Patient Measurements: Height: 4\' 11"  (149.9 cm) Weight: 137 lb 8 oz (62.37 kg) IBW/kg (Calculated) : 43.2  Vital Signs: Temp: 98.9 F (37.2 C) (02/20 0540) Temp Source: Oral (02/20 0540) BP: 137/48 mmHg (02/20 0540) Pulse Rate: 73 (02/20 0540)  Labs:  Recent Labs  02/28/15 1356 03/02/15 0525 03/03/15 0451  WBC  --  6.9 6.8  HGB  --  7.8* 8.5*  PLT  --  188 217  CREATININE 0.73 0.80  --    Estimated Creatinine Clearance: 42.1 mL/min (by C-G formula based on Cr of 0.8).  Assessment: Pharmacy consulted to dose levofloxacin for pneumonia in this 82 year old female who is currently on day #4 of piperacillin/tazobactam. Patient has levofloxacin listed as an allergy but patient denies that she has an allergy to levofloxacin.   CrCl 42 mL/min  Plan:  Levofloxacin 750 mg PO q 48 hours per renal function for a total duration of 5 additional days of antibiotic therapy with a stop date of 2/24.  Pharmacy will continue to monitor, thank you for the consult.  Lenis Noon, PharmD Clinical Pharmacist 03/03/2015,9:09 AM

## 2015-03-03 NOTE — Clinical Social Work Note (Signed)
Clinical Social Work Assessment  Patient Details  Name: Natasha [name] MRN: [id number] Date of Birth: [birth year]  Date of referral:  03/03/15               Reason for consult:  Facility Placement                Permission sought to share information with:   Family  Permission granted to share information::  Yes, Verbal Permission Granted  Name::     Husband, Evette Doffing   Housing/Transportation Living arrangements for the past 2 months:  Single Family Home Source of Information:  Patient Patient Interpreter Needed:  None Criminal Activity/Legal Involvement Pertinent to Current Situation/Hospitalization:  No - Comment as needed Significant Relationships:  Spouse Lives with:  Spouse Do you feel safe going back to the place where you live?  Yes Need for family participation in patient care:  No (Coment)  Care giving concerns:  No care giving concerns identified.    Social Worker assessment / plan:  CSW met with pt to address consult for SNF. CSW introduced herself and explained role of social work. CSW also explained process of discharging to SNF. PT is recommending STR at SNF. Pt lives with her husband at [facility] and would like to go to Healthcare. CSW notified facility, who made a bed offer. Pt accepted.   Pt is ready for discharge today. Facility has received discharge information and is ready to admit pt. Facility will provide transportation and will pick up at 3 PM. Pt is agreeable and agreeable to discharge plan. CSW is signing off as no further needs identified.   Employment status:  Retired Forensic scientist:  [employer] PT Recommendations:  Research officer, trade union, [place] / Referral to community resources:  [place]  Patient/Family's Response to care:  Pt was appreciative of CSW support.   Patient/Family's Understanding of and Emotional Response to Diagnosis, Current Treatment, and Prognosis:  Pt would like to go to [facility]  for [employer].  Emotional Assessment Appearance:  Appears stated age Attitude/Demeanor/Rapport:  Other (Appropriate) Affect (typically observed):  Accepting, Adaptable, Pleasant Orientation:  Oriented to Self, Oriented to Place, Oriented to  Time, Oriented to Situation Alcohol / Substance use:  Never Used Psych involvement (Current and /or in the community):  No (Comment)  Discharge Needs  Concerns to be addressed:  Adjustment to Illness Readmission within the last 30 days:  No Current discharge risk:  None Barriers to Discharge:  No Barriers Identified   Darden Dates, LCSW 03/03/2015, 12:34 PM

## 2015-03-04 DIAGNOSIS — I10 Essential (primary) hypertension: Secondary | ICD-10-CM

## 2015-03-04 DIAGNOSIS — G2581 Restless legs syndrome: Secondary | ICD-10-CM

## 2015-03-04 DIAGNOSIS — J9611 Chronic respiratory failure with hypoxia: Secondary | ICD-10-CM

## 2015-03-04 DIAGNOSIS — I739 Peripheral vascular disease, unspecified: Secondary | ICD-10-CM | POA: Diagnosis not present

## 2015-03-04 DIAGNOSIS — G9009 Other idiopathic peripheral autonomic neuropathy: Secondary | ICD-10-CM

## 2015-03-04 DIAGNOSIS — J189 Pneumonia, unspecified organism: Secondary | ICD-10-CM | POA: Diagnosis not present

## 2015-03-05 LAB — BLOOD GAS, ARTERIAL
Acid-Base Excess: 6.2 mmol/L — ABNORMAL HIGH (ref 0.0–3.0)
Allens test (pass/fail): POSITIVE — AB
Bicarbonate: 32.5 mEq/L — ABNORMAL HIGH (ref 21.0–28.0)
FIO2: 0.44
O2 Saturation: 91.3 %
PATIENT TEMPERATURE: 37
PCO2 ART: 55 mmHg — AB (ref 32.0–48.0)
PH ART: 7.38 (ref 7.350–7.450)
PO2 ART: 63 mmHg — AB (ref 83.0–108.0)

## 2015-03-05 LAB — CULTURE, BLOOD (ROUTINE X 2)
CULTURE: NO GROWTH
CULTURE: NO GROWTH

## 2015-03-10 ENCOUNTER — Telehealth: Payer: Self-pay

## 2015-03-10 DIAGNOSIS — R1110 Vomiting, unspecified: Secondary | ICD-10-CM | POA: Diagnosis not present

## 2015-03-10 NOTE — Telephone Encounter (Signed)
PLEASE NOTE: All timestamps contained within this report are represented as [place] Standard Time. CONFIDENTIALTY NOTICE: This fax transmission is intended only for the addressee. It contains information that is legally privileged, confidential or otherwise protected from use or disclosure. If you are not the intended recipient, you are strictly prohibited from reviewing, disclosing, copying using or disseminating any of this information or taking any action in reliance on or regarding this information. If you have received this fax in error, please notify us immediately by telephone so that we can arrange for its return to [place]. Phone: [phone], Toll-Free: [phone], Fax: [phone] Page: 1 of 1 Call Id: [id number] [place] Night - Client Nonclinical Telephone Record [place] Night - Client Client Site [place] Physician Viviana Simpler Contact Type Call Who Is Calling Physician / Provider / Hospital Call Type Provider Call [facility] Page Now Reason for Call Request for Orders Initial Comment [facility] RN with [facility], pt has constipation, refusing milk of magnesia, which is in her standing order, needs order for something else. Additional Comment Patient Name Natasha [name] Patient DOB Feb 20, 1928 Requesting Provider Baker Janus, RN Physician Number [phone] Facility Name [facility] Phone DateTime Result/Outcome Message Type Notes Penni Homans [id number] 03/09/2015 11:04:42 AM Paged On Call Back to Call Center Doctor Paged Please call Ashleigh with [facility] at [phone] Penni Homans 03/09/2015 11:08:59 AM Spoke with On Call - General Message Result Call Closed By: Dalia Heading Transaction Date/Time: 03/09/2015 11:01:44 AM (ET)

## 2015-03-10 NOTE — Telephone Encounter (Signed)
Seen today  KUB ordered Straight cath to check for post void residual miralax for bowels

## 2015-03-11 ENCOUNTER — Emergency Department: Payer: Medicare Other

## 2015-03-11 ENCOUNTER — Encounter: Payer: Self-pay | Admitting: Emergency Medicine

## 2015-03-11 ENCOUNTER — Inpatient Hospital Stay
Admission: EM | Admit: 2015-03-11 | Discharge: 2015-03-16 | DRG: 871 | Disposition: A | Discharge status: Home Health | Payer: Medicare Other | Attending: Internal Medicine | Admitting: Internal Medicine

## 2015-03-11 DIAGNOSIS — Z95828 Presence of other vascular implants and grafts: Secondary | ICD-10-CM

## 2015-03-11 DIAGNOSIS — I739 Peripheral vascular disease, unspecified: Secondary | ICD-10-CM | POA: Diagnosis present

## 2015-03-11 DIAGNOSIS — Z7901 Long term (current) use of anticoagulants: Secondary | ICD-10-CM | POA: Diagnosis not present

## 2015-03-11 DIAGNOSIS — K219 Gastro-esophageal reflux disease without esophagitis: Secondary | ICD-10-CM | POA: Diagnosis present

## 2015-03-11 DIAGNOSIS — J189 Pneumonia, unspecified organism: Secondary | ICD-10-CM | POA: Diagnosis present

## 2015-03-11 DIAGNOSIS — E871 Hypo-osmolality and hyponatremia: Secondary | ICD-10-CM | POA: Diagnosis present

## 2015-03-11 DIAGNOSIS — I959 Hypotension, unspecified: Secondary | ICD-10-CM | POA: Diagnosis present

## 2015-03-11 DIAGNOSIS — N170 Acute kidney failure with tubular necrosis: Secondary | ICD-10-CM | POA: Diagnosis present

## 2015-03-11 DIAGNOSIS — E876 Hypokalemia: Secondary | ICD-10-CM | POA: Diagnosis present

## 2015-03-11 DIAGNOSIS — R6521 Severe sepsis with septic shock: Secondary | ICD-10-CM | POA: Diagnosis present

## 2015-03-11 DIAGNOSIS — Z7951 Long term (current) use of inhaled steroids: Secondary | ICD-10-CM

## 2015-03-11 DIAGNOSIS — A419 Sepsis, unspecified organism: Secondary | ICD-10-CM | POA: Diagnosis present

## 2015-03-11 DIAGNOSIS — D649 Anemia, unspecified: Secondary | ICD-10-CM | POA: Diagnosis present

## 2015-03-11 DIAGNOSIS — Z780 Asymptomatic menopausal state: Secondary | ICD-10-CM

## 2015-03-11 DIAGNOSIS — J45909 Unspecified asthma, uncomplicated: Secondary | ICD-10-CM | POA: Diagnosis present

## 2015-03-11 DIAGNOSIS — R0902 Hypoxemia: Secondary | ICD-10-CM | POA: Diagnosis present

## 2015-03-11 DIAGNOSIS — G4700 Insomnia, unspecified: Secondary | ICD-10-CM | POA: Diagnosis present

## 2015-03-11 DIAGNOSIS — I10 Essential (primary) hypertension: Secondary | ICD-10-CM | POA: Diagnosis present

## 2015-03-11 DIAGNOSIS — E860 Dehydration: Secondary | ICD-10-CM | POA: Diagnosis present

## 2015-03-11 DIAGNOSIS — R197 Diarrhea, unspecified: Secondary | ICD-10-CM | POA: Diagnosis present

## 2015-03-11 DIAGNOSIS — R0602 Shortness of breath: Secondary | ICD-10-CM

## 2015-03-11 DIAGNOSIS — G9341 Metabolic encephalopathy: Secondary | ICD-10-CM | POA: Diagnosis present

## 2015-03-11 DIAGNOSIS — R4182 Altered mental status, unspecified: Secondary | ICD-10-CM | POA: Diagnosis present

## 2015-03-11 DIAGNOSIS — Z87891 Personal history of nicotine dependence: Secondary | ICD-10-CM | POA: Diagnosis not present

## 2015-03-11 DIAGNOSIS — Y95 Nosocomial condition: Secondary | ICD-10-CM | POA: Diagnosis present

## 2015-03-11 DIAGNOSIS — R509 Fever, unspecified: Secondary | ICD-10-CM

## 2015-03-11 DIAGNOSIS — N179 Acute kidney failure, unspecified: Secondary | ICD-10-CM

## 2015-03-11 LAB — CBC WITH DIFFERENTIAL/PLATELET
Basophils Absolute: 0.1 10*3/uL (ref 0–0.1)
Basophils Relative: 0 %
Eosinophils Absolute: 0 10*3/uL (ref 0–0.7)
Eosinophils Relative: 0 %
HEMATOCRIT: 22.1 % — AB (ref 35.0–47.0)
Hemoglobin: 7.2 g/dL — ABNORMAL LOW (ref 12.0–16.0)
LYMPHS ABS: 0.6 10*3/uL — AB (ref 1.0–3.6)
LYMPHS PCT: 5 %
MCH: 25.8 pg — AB (ref 26.0–34.0)
MCHC: 32.8 g/dL (ref 32.0–36.0)
MCV: 78.5 fL — AB (ref 80.0–100.0)
MONO ABS: 0.8 10*3/uL (ref 0.2–0.9)
MONOS PCT: 7 %
NEUTROS ABS: 10 10*3/uL — AB (ref 1.4–6.5)
Neutrophils Relative %: 88 %
Platelets: 385 10*3/uL (ref 150–440)
RBC: 2.81 MIL/uL — ABNORMAL LOW (ref 3.80–5.20)
RDW: 17.9 % — AB (ref 11.5–14.5)
WBC: 11.5 10*3/uL — ABNORMAL HIGH (ref 3.6–11.0)

## 2015-03-11 LAB — LACTIC ACID, PLASMA
Lactic Acid, Venous: 1.8 mmol/L (ref 0.5–2.0)
Lactic Acid, Venous: 0.8 mmol/L (ref 0.5–2.0)

## 2015-03-11 LAB — RAPID INFLUENZA A&B ANTIGENS (ARMC ONLY): INFLUENZA A (ARMC): NEGATIVE

## 2015-03-11 LAB — URINALYSIS COMPLETE WITH MICROSCOPIC (ARMC ONLY)
BACTERIA UA: NONE SEEN
Bilirubin Urine: NEGATIVE
Glucose, UA: NEGATIVE mg/dL
Hgb urine dipstick: NEGATIVE
KETONES UR: NEGATIVE mg/dL
LEUKOCYTES UA: NEGATIVE
Nitrite: NEGATIVE
PH: 5 (ref 5.0–8.0)
PROTEIN: NEGATIVE mg/dL
SQUAMOUS EPITHELIAL / LPF: NONE SEEN
Specific Gravity, Urine: 1.005 (ref 1.005–1.030)
WBC, UA: NONE SEEN WBC/hpf (ref 0–5)

## 2015-03-11 LAB — COMPREHENSIVE METABOLIC PANEL
ALK PHOS: 76 U/L (ref 38–126)
ALT: 10 U/L — AB (ref 14–54)
AST: 19 U/L (ref 15–41)
Albumin: 2.7 g/dL — ABNORMAL LOW (ref 3.5–5.0)
Anion gap: 10 (ref 5–15)
BUN: 35 mg/dL — AB (ref 6–20)
CHLORIDE: 84 mmol/L — AB (ref 101–111)
CO2: 33 mmol/L — AB (ref 22–32)
CREATININE: 1.85 mg/dL — AB (ref 0.44–1.00)
Calcium: 8.1 mg/dL — ABNORMAL LOW (ref 8.9–10.3)
GFR calc Af Amer: 28 mL/min — ABNORMAL LOW (ref >60)
GFR calc non Af Amer: 24 mL/min — ABNORMAL LOW (ref >60)
Glucose, Bld: 136 mg/dL — ABNORMAL HIGH (ref 65–99)
Potassium: 2.4 mmol/L — CL (ref 3.5–5.1)
SODIUM: 127 mmol/L — AB (ref 135–145)
Total Bilirubin: 0.5 mg/dL (ref 0.3–1.2)
Total Protein: 5.7 g/dL — ABNORMAL LOW (ref 6.5–8.1)

## 2015-03-11 LAB — PROTIME-INR
INR: 1.65
Prothrombin Time: 19.5 seconds — ABNORMAL HIGH (ref 11.4–15.0)

## 2015-03-11 LAB — APTT: aPTT: 38 seconds — ABNORMAL HIGH (ref 24–36)

## 2015-03-11 LAB — MRSA PCR SCREENING: MRSA by PCR: NEGATIVE

## 2015-03-11 LAB — PHOSPHORUS: PHOSPHORUS: 2.7 mg/dL (ref 2.5–4.6)

## 2015-03-11 LAB — LIPASE, BLOOD: Lipase: 56 U/L — ABNORMAL HIGH (ref 11–51)

## 2015-03-11 LAB — GLUCOSE, CAPILLARY: Glucose-Capillary: 160 mg/dL — ABNORMAL HIGH (ref 65–99)

## 2015-03-11 LAB — RAPID INFLUENZA A&B ANTIGENS: Influenza B (ARMC): NEGATIVE

## 2015-03-11 LAB — MAGNESIUM: Magnesium: 1.7 mg/dL (ref 1.7–2.4)

## 2015-03-11 LAB — TROPONIN I: Troponin I: <0.03 ng/mL (ref <0.031)

## 2015-03-11 MED ORDER — SODIUM CHLORIDE 0.9 % IV BOLUS (SEPSIS)
1000.0000 mL | INTRAVENOUS | Status: AC
  Administered 2015-03-11 (×2): 1000 mL via INTRAVENOUS

## 2015-03-11 MED ORDER — SODIUM CHLORIDE 0.9 % IV BOLUS (SEPSIS)
1000.0000 mL | Freq: Once | INTRAVENOUS | Status: AC
  Administered 2015-03-11: 1000 mL via INTRAVENOUS

## 2015-03-11 MED ORDER — NOREPINEPHRINE BITARTRATE 1 MG/ML IV SOLN
0.0000 ug/min | INTRAVENOUS | Status: DC
  Administered 2015-03-12: 20 ug/min via INTRAVENOUS
  Administered 2015-03-12: 18 ug/min via INTRAVENOUS
  Administered 2015-03-12: 20 ug/min via INTRAVENOUS
  Administered 2015-03-12: 8 ug/min via INTRAVENOUS
  Administered 2015-03-12: 20 ug/min via INTRAVENOUS
  Filled 2015-03-11 (×7): qty 4

## 2015-03-11 MED ORDER — PANTOPRAZOLE SODIUM 40 MG PO TBEC
40.0000 mg | DELAYED_RELEASE_TABLET | Freq: Every day | ORAL | Status: DC
  Administered 2015-03-12 – 2015-03-16 (×5): 40 mg via ORAL
  Filled 2015-03-11 (×5): qty 1

## 2015-03-11 MED ORDER — ACETAMINOPHEN 650 MG RE SUPP: 650.0000 mg | Freq: Four times a day (QID) | RECTAL | Status: DC | PRN

## 2015-03-11 MED ORDER — SODIUM CHLORIDE 0.9 % IV SOLN
INTRAVENOUS | Status: DC
  Administered 2015-03-11 – 2015-03-14 (×5): via INTRAVENOUS

## 2015-03-11 MED ORDER — SENNOSIDES-DOCUSATE SODIUM 8.6-50 MG PO TABS: 1.0000 | ORAL_TABLET | Freq: Every day | ORAL | Status: DC | PRN

## 2015-03-11 MED ORDER — DEXTROSE 5 % IV SOLN
0.0000 ug/min | Freq: Once | INTRAVENOUS | Status: AC
  Administered 2015-03-11: 2 ug/min via INTRAVENOUS
  Filled 2015-03-11: qty 4

## 2015-03-11 MED ORDER — BUDESONIDE 180 MCG/ACT IN AEPB: 2.0000 | INHALATION_SPRAY | Freq: Two times a day (BID) | RESPIRATORY_TRACT | Status: DC

## 2015-03-11 MED ORDER — POTASSIUM CHLORIDE 10 MEQ/50ML IV SOLN
10.0000 meq | INTRAVENOUS | Status: AC
  Administered 2015-03-11 (×5): 10 meq via INTRAVENOUS
  Filled 2015-03-11 (×6): qty 50

## 2015-03-11 MED ORDER — ONDANSETRON HCL 4 MG/2ML IJ SOLN: 4.0000 mg | Freq: Four times a day (QID) | INTRAMUSCULAR | Status: DC | PRN

## 2015-03-11 MED ORDER — DEXTROSE 5 % IV SOLN
0.0000 ug/min | Freq: Once | INTRAVENOUS | Status: AC
  Administered 2015-03-11: 17 ug/min via INTRAVENOUS
  Filled 2015-03-11: qty 4

## 2015-03-11 MED ORDER — BUDESONIDE 0.5 MG/2ML IN SUSP
0.5000 mg | Freq: Two times a day (BID) | RESPIRATORY_TRACT | Status: DC
  Administered 2015-03-11 – 2015-03-16 (×9): 0.5 mg via RESPIRATORY_TRACT
  Filled 2015-03-11 (×10): qty 2

## 2015-03-11 MED ORDER — BISACODYL 5 MG PO TBEC: 5.0000 mg | DELAYED_RELEASE_TABLET | Freq: Every day | ORAL | Status: DC | PRN

## 2015-03-11 MED ORDER — ONDANSETRON HCL 4 MG PO TABS: 4.0000 mg | ORAL_TABLET | Freq: Four times a day (QID) | ORAL | Status: DC | PRN

## 2015-03-11 MED ORDER — CILOSTAZOL 100 MG PO TABS
200.0000 mg | ORAL_TABLET | Freq: Two times a day (BID) | ORAL | Status: DC
  Administered 2015-03-11 – 2015-03-16 (×10): 200 mg via ORAL
  Filled 2015-03-11 (×12): qty 2

## 2015-03-11 MED ORDER — ACETAMINOPHEN 325 MG PO TABS
650.0000 mg | ORAL_TABLET | Freq: Four times a day (QID) | ORAL | Status: DC | PRN
  Administered 2015-03-12 – 2015-03-14 (×4): 650 mg via ORAL
  Filled 2015-03-11 (×4): qty 2

## 2015-03-11 MED ORDER — VANCOMYCIN HCL IN DEXTROSE 750-5 MG/150ML-% IV SOLN
750.0000 mg | Freq: Once | INTRAVENOUS | Status: AC
  Administered 2015-03-11: 750 mg via INTRAVENOUS
  Filled 2015-03-11: qty 150

## 2015-03-11 MED ORDER — SIMVASTATIN 20 MG PO TABS
20.0000 mg | ORAL_TABLET | Freq: Every day | ORAL | Status: DC
  Administered 2015-03-12 – 2015-03-14 (×3): 20 mg via ORAL
  Filled 2015-03-11 (×3): qty 1

## 2015-03-11 MED ORDER — ALBUTEROL SULFATE (2.5 MG/3ML) 0.083% IN NEBU
3.0000 mL | INHALATION_SOLUTION | Freq: Two times a day (BID) | RESPIRATORY_TRACT | Status: DC
  Administered 2015-03-11 – 2015-03-12 (×2): 3 mL via RESPIRATORY_TRACT
  Filled 2015-03-11 (×2): qty 3

## 2015-03-11 MED ORDER — VANCOMYCIN HCL IN DEXTROSE 1-5 GM/200ML-% IV SOLN
INTRAVENOUS | Status: AC
  Filled 2015-03-11: qty 200

## 2015-03-11 MED ORDER — VANCOMYCIN HCL IN DEXTROSE 750-5 MG/150ML-% IV SOLN: 750.0000 mg | INTRAVENOUS | Status: DC

## 2015-03-11 MED ORDER — RIVAROXABAN 10 MG PO TABS: 20.0000 mg | ORAL_TABLET | Freq: Every day | ORAL | Status: DC

## 2015-03-11 MED ORDER — VANCOMYCIN HCL IN DEXTROSE 750-5 MG/150ML-% IV SOLN
750.0000 mg | INTRAVENOUS | Status: DC
  Filled 2015-03-11: qty 150

## 2015-03-11 MED ORDER — MONTELUKAST SODIUM 10 MG PO TABS
10.0000 mg | ORAL_TABLET | Freq: Every day | ORAL | Status: DC
  Administered 2015-03-12 – 2015-03-16 (×5): 10 mg via ORAL
  Filled 2015-03-11 (×5): qty 1

## 2015-03-11 MED ORDER — CEFEPIME HCL 1 G IJ SOLR
1.0000 g | INTRAMUSCULAR | Status: DC
  Filled 2015-03-11: qty 1

## 2015-03-11 MED ORDER — VANCOMYCIN HCL IN DEXTROSE 1-5 GM/200ML-% IV SOLN: 1000.0000 mg | Freq: Once | INTRAVENOUS | Status: DC

## 2015-03-11 MED ORDER — PRAMIPEXOLE DIHYDROCHLORIDE 0.25 MG PO TABS
0.2500 mg | ORAL_TABLET | Freq: Two times a day (BID) | ORAL | Status: DC
  Administered 2015-03-11 – 2015-03-16 (×10): 0.25 mg via ORAL
  Filled 2015-03-11 (×10): qty 1

## 2015-03-11 MED ORDER — DEXTROSE 5 % IV SOLN
2.0000 g | Freq: Once | INTRAVENOUS | Status: AC
  Administered 2015-03-11: 2 g via INTRAVENOUS
  Filled 2015-03-11: qty 2

## 2015-03-11 MED ORDER — CALCIUM CARBONATE-VITAMIN D 500-200 MG-UNIT PO TABS
1.0000 | ORAL_TABLET | Freq: Every day | ORAL | Status: DC
  Administered 2015-03-12 – 2015-03-16 (×5): 1 via ORAL
  Filled 2015-03-11 (×5): qty 1

## 2015-03-11 MED ORDER — SENNOSIDES-DOCUSATE SODIUM 8.6-50 MG PO TABS
1.0000 | ORAL_TABLET | Freq: Every evening | ORAL | Status: DC | PRN
  Administered 2015-03-12: 1 via ORAL
  Filled 2015-03-11: qty 1

## 2015-03-11 NOTE — ED Notes (Signed)
Pt brought to ED via [facility] who picked her up at her residence at [facility].  Staff and husband state that she has had altered mental status since about 12:45 today.  Pt has recently been treated with levoquin, likely for a respiratory infection, as patient reports frequent coughing.  Pt BP 88/50 per EMS.  EMS gave 561ml of NS via IV.

## 2015-03-11 NOTE — Progress Notes (Signed)
ANTICOAGULATION CONSULT NOTE - Initial Consult  Pharmacy Consult for Xarelto Indication: PVD  Allergies  Allergen Reactions  . Codeine Diarrhea  . Hydrocodone Nausea And Vomiting  . Tramadol Nausea And Vomiting    Patient Measurements: Height: 4\' 11"  (149.9 cm) Weight: 137 lb (62.143 kg) IBW/kg (Calculated) : 43.2 Heparin Dosing Weight: na  Vital Signs: Temp: 99.5 F (37.5 C) (02/28 2130) Temp Source: Oral (02/28 1900) BP: 109/56 mmHg (02/28 2130) Pulse Rate: 89 (02/28 2130)  Labs:  Recent Labs  03/11/15 1511  HGB 7.2*  HCT 22.1*  PLT 385  APTT 38*  LABPROT 19.5*  INR 1.65  CREATININE 1.85*  TROPONINI <0.03    Estimated Creatinine Clearance: 18.2 mL/min (by C-G formula based on Cr of 1.85).   Medical History: Past Medical History  Diagnosis Date  . GERD (gastroesophageal reflux disease)   . Asthma   . Anemia   . Arthritis   . H/O blood clots 2014  . Gallstones 2015  . Hypertension     Medications:  Prescriptions prior to admission  Medication Sig Dispense Refill Last Dose  . albuterol (PROVENTIL HFA;VENTOLIN HFA) 108 (90 BASE) MCG/ACT inhaler Inhale 2 puffs into the lungs 2 (two) times daily.    03/11/2015 at Unknown time  . budesonide (PULMICORT) 180 MCG/ACT inhaler Inhale 2 puffs into the lungs 2 (two) times daily.   03/11/2015 at Unknown time  . Calcium-Magnesium-Vitamin D (CALCIUM 1200+D3 PO) Take 1 tablet by mouth daily.   03/11/2015 at Unknown time  . cilostazol (PLETAL) 100 MG tablet Take 200 mg by mouth 2 (two) times daily.    03/11/2015 at Unknown time  . diltiazem (DILACOR XR) 180 MG 24 hr capsule Take 180 mg by mouth daily.   03/11/2015 at Unknown time  . furosemide (LASIX) 20 MG tablet Take 20 mg by mouth 2 (two) times daily.   03/11/2015 at Unknown time  . gabapentin (NEURONTIN) 300 MG capsule Take 300 mg by mouth 2 (two) times daily.   03/11/2015 at Unknown time  . Melatonin 5 MG TABS Take 5-10 mg by mouth at bedtime as needed (for sleep).    03/10/2015 at Unknown time  . montelukast (SINGULAIR) 10 MG tablet Take 10 mg by mouth daily.   03/11/2015 at Unknown time  . omeprazole (PRILOSEC) 20 MG capsule Take 20 mg by mouth at bedtime.   03/10/2015 at Unknown time  . pramipexole (MIRAPEX) 0.25 MG tablet Take 0.25 mg by mouth 2 (two) times daily.   03/11/2015 at Unknown time  . rivaroxaban (XARELTO) 20 MG TABS tablet Take 20 mg by mouth daily with supper.   03/10/2015 at 1700  . senna-docusate (SENOKOT-S) 8.6-50 MG tablet Take 1-2 tablets by mouth daily as needed for mild constipation.   Past Week at Unknown time  . simvastatin (ZOCOR) 20 MG tablet Take 20 mg by mouth daily.   03/11/2015 at Unknown time  . HYDROmorphone (DILAUDID) 2 MG tablet Take 1 tablet (2 mg total) by mouth 3 (three) times daily as needed for severe pain. (Patient not taking: Reported on 02/27/2015) 20 tablet 0 Not Taking at Unknown time  . levofloxacin (LEVAQUIN) 750 MG tablet Take 1 tablet (750 mg total) by mouth daily. (Patient not taking: Reported on 03/11/2015) 7 tablet 0     Assessment: Patient was taking Xarelto 20mg  daily PTA for PVD. Patient with decreased renal function on admission. Baseline SCr is 0.8.     Plan:  Will hold tonights dose of Xarelto and reassess  renal function after hydration.  Paulina Fusi, PharmD, BCPS 03/11/2015 9:51 PM

## 2015-03-11 NOTE — ED Provider Notes (Signed)
[facility] Emergency Department Provider Note  ____________________________________________  Time seen: 2:40 PM  I have reviewed the triage vital signs and the nursing notes.   HISTORY  Chief Complaint Altered Mental Status Level 5 caveat:  Portions of the history and physical were unable to be obtained due to the patient's acute illness     HPI Natasha [name] is a 82 y.o. female brought to the ED from [facility] skilled nursing facility due to severe weakness, hypoxia, shortness of breath.She recently completed a two-week course of Levaquin which stopped about 4 or 5 days ago where she was treated for pneumonia. She is also reported to have altered mental status since about noon today. Decreased oral intake.     Past Medical History  Diagnosis Date  . GERD (gastroesophageal reflux disease)   . Asthma   . Anemia   . Arthritis   . H/O blood clots 2014  . Gallstones 2015  . Hypertension      Patient Active Problem List   Diagnosis Date Noted  . 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 Ischemic leg 02/27/2015  . Calculus of gallbladder with other cholecystitis, without mention of obstruction 04/20/2013     Past Surgical History  Procedure Laterality Date  . Abdominal hysterectomy  1960  . Appendectomy  1946  . Cochlear implant  2009  . Eye surgery  [phone]    cataract  . Stent placement  2006-2014    multiple stent placements in legs  . Colonoscopy  2015    Dr. Rayann Heman   . Cholecystectomy  04-24-13  . Breast biopsy Bilateral     negative  . Peripheral vascular catheterization N/A 02/27/2015    Procedure: Abdominal Aortogram w/Lower Extremity;  Surgeon: Algernon Huxley, MD;  Location: [facility];  Service: Cardiovascular;  Laterality: N/A;  . Peripheral vascular catheterization  02/27/2015    Procedure: Lower Extremity Intervention;  Surgeon: Algernon Huxley, MD;  Location: [place] [facility];  Service: Cardiovascular;;  .  Peripheral vascular catheterization Left 02/28/2015    Procedure: Lower Extremity Angiography;  Surgeon: Algernon Huxley, MD;  Location: [place] [facility];  Service: Cardiovascular;  Laterality: Left;  . Peripheral vascular catheterization  02/28/2015    Procedure: Lower Extremity Intervention;  Surgeon: Algernon Huxley, MD;  Location: [place] [facility];  Service: Cardiovascular;;     Current Outpatient Rx  Name  Route  Sig  Dispense  Refill  . albuterol (PROVENTIL HFA;VENTOLIN HFA) 108 (90 BASE) MCG/ACT inhaler   Inhalation   Inhale 2 puffs into the lungs 2 (two) times daily.          . budesonide (PULMICORT) 180 MCG/ACT inhaler   Inhalation   Inhale 2 puffs into the lungs 2 (two) times daily.         . Calcium-Magnesium-Vitamin D (CALCIUM 1200+D3 PO)   Oral   Take 1 tablet by mouth daily.         . cilostazol (PLETAL) 100 MG tablet   Oral   Take 200 mg by mouth 2 (two) times daily.          Marland Kitchen diltiazem (DILACOR XR) 180 MG 24 hr capsule   Oral   Take 180 mg by mouth daily.         . furosemide (LASIX) 20 MG tablet   Oral   Take 20 mg by mouth 2 (two) times daily.         Marland Kitchen  gabapentin (NEURONTIN) 300 MG capsule   Oral   Take 300 mg by mouth 2 (two) times daily.         . Melatonin 5 MG TABS   Oral   Take 5-10 mg by mouth at bedtime as needed (for sleep).         . montelukast (SINGULAIR) 10 MG tablet   Oral   Take 10 mg by mouth daily.         Marland Kitchen omeprazole (PRILOSEC) 20 MG capsule   Oral   Take 20 mg by mouth at bedtime.         . pramipexole (MIRAPEX) 0.25 MG tablet   Oral   Take 0.25 mg by mouth 2 (two) times daily.         . rivaroxaban (XARELTO) 20 MG TABS tablet   Oral   Take 20 mg by mouth daily with supper.         . senna-docusate (SENOKOT-S) 8.6-50 MG tablet   Oral   Take 1-2 tablets by mouth daily as needed for mild constipation.         . simvastatin (ZOCOR) 20 MG tablet   Oral   Take 20 mg by mouth daily.         Marland Kitchen  HYDROmorphone (DILAUDID) 2 MG tablet   Oral   Take 1 tablet (2 mg total) by mouth 3 (three) times daily as needed for severe pain. Patient not taking: Reported on 02/27/2015   20 tablet   0   . levofloxacin (LEVAQUIN) 750 MG tablet   Oral   Take 1 tablet (750 mg total) by mouth daily. Patient not taking: Reported on 03/11/2015   7 tablet   0      Allergies Codeine; Hydrocodone; and Tramadol   Family History  Problem Relation Age of Onset  . Heart disease Mother   . Heart disease Father   . Cancer Sister     lung  . Breast cancer Daughter     Social History Social History  Substance Use Topics  . Smoking status: Former Research scientist (life sciences)  . Smokeless tobacco: None  . Alcohol Use: No    Review of Systems  Constitutional:   No fever or chills. No weight changes Eyes:   No blurry vision or double vision.  ENT:   No sore throat.  Cardiovascular:   No chest pain.  Respiratory:   Shortness of breath and cough. Gastrointestinal:   Negative for abdominal pain, vomiting and diarrhea. Positive constipation No BRBPR or melena. Genitourinary:   Positive chronic urinary retention.. Musculoskeletal:   Positive right upper back pain behind the scapula Skin:   Negative for rash. Neurological:   Negative for headaches, focal weakness or numbness. Psychiatric:  No anxiety or depression.   Endocrine:  Generalized weakness.  10-point ROS otherwise negative.  ____________________________________________   PHYSICAL EXAM:  VITAL SIGNS: ED Triage Vitals  Enc Vitals Group     BP 03/11/15 1516 77/38 mmHg     Pulse Rate 03/11/15 1503 83     Resp 03/11/15 1503 12     Temp 03/11/15 1503 99.1 F (37.3 C)     Temp Source 03/11/15 1503 Oral     SpO2 03/11/15 1505 88 %     Weight 03/11/15 1503 137 lb (62.143 kg)     Height 03/11/15 1503 4\' 11"  (1.499 m)     Head Cir --      Peak Flow --      Pain Score  03/11/15 1505 6     Pain Loc --      Pain Edu? --      Excl. in [place]? --     Vital  signs reviewed, nursing assessments reviewed.   Constitutional:   Alert and oriented. Ill-appearing Eyes:   No scleral icterus. No conjunctival pallor. PERRL. EOMI ENT   Head:   Normocephalic and atraumatic.   Nose:   No congestion/rhinnorhea. No septal hematoma   Mouth/Throat:   Dry mucous membranes, no pharyngeal erythema. No peritonsillar mass.    Neck:   No stridor. No SubQ emphysema. No meningismus. Hematological/Lymphatic/Immunilogical:   No cervical lymphadenopathy. Cardiovascular:   RRR. Symmetric bilateral radial and DP pulses.  No murmurs.  Respiratory:   Tachypnea, diffuse mild expiratory wheezing, rhonchi at the right base. Gastrointestinal:   Soft with suprapubic tenderness. There is a enlarged palpable bladder.. Non distended. There is no CVA tenderness.  No rebound, rigidity, or guarding. Genitourinary:   deferred Musculoskeletal:   Nontender with normal range of motion in all extremities. No joint effusions.  No lower extremity tenderness.  No edema. There is tenderness in the upper thoracic back just medial to the right scapula. Neurologic:   Normal speech and language.  CN 2-10 normal. Motor grossly intact. No gross focal neurologic deficits are appreciated.  Skin:    Skin is warm, dry and intact. No rash noted.  No petechiae, purpura, or bullae. Psychiatric:   Mood and affect are normal. ____________________________________________    LABS (pertinent positives/negatives) (all labs ordered are listed, but only abnormal results are displayed) Labs Reviewed  COMPREHENSIVE METABOLIC PANEL - Abnormal; Notable for the following:    Sodium 127 (*)    Potassium 2.4 (*)    Chloride 84 (*)    CO2 33 (*)    Glucose, Bld 136 (*)    BUN 35 (*)    Creatinine, Ser 1.85 (*)    Calcium 8.1 (*)    Total Protein 5.7 (*)    Albumin 2.7 (*)    ALT 10 (*)    GFR calc non Af Amer 24 (*)    GFR calc Af Amer 28 (*)    All other components within normal limits   LIPASE, BLOOD - Abnormal; Notable for the following:    Lipase 56 (*)    All other components within normal limits  CBC WITH DIFFERENTIAL/PLATELET - Abnormal; Notable for the following:    WBC 11.5 (*)    RBC 2.81 (*)    Hemoglobin 7.2 (*)    HCT 22.1 (*)    MCV 78.5 (*)    MCH 25.8 (*)    RDW 17.9 (*)    Neutro Abs 10.0 (*)    Lymphs Abs 0.6 (*)    All other components within normal limits  APTT - Abnormal; Notable for the following:    aPTT 38 (*)    All other components within normal limits  PROTIME-INR - Abnormal; Notable for the following:    Prothrombin Time 19.5 (*)    All other components within normal limits  CULTURE, BLOOD (ROUTINE X 2)  CULTURE, BLOOD (ROUTINE X 2)  CULTURE, EXPECTORATED SPUTUM-ASSESSMENT  URINE CULTURE  RAPID INFLUENZA A&B ANTIGENS ([facility] ONLY)  LACTIC ACID, PLASMA  TROPONIN I  MAGNESIUM  PHOSPHORUS  LACTIC ACID, PLASMA  URINALYSIS COMPLETEWITH MICROSCOPIC ([facility] ONLY)   ____________________________________________   EKG    ____________________________________________    RADIOLOGY  Chest x-ray unremarkable Repeat chest x-ray after central line placement shows  central line is inserted 5 cm too deep. We'll withdraw.  ____________________________________________   PROCEDURES CRITICAL CARE Performed by: Joni Fears, Roland Lipke   Total critical care time: 35 minutes  Critical care time was exclusive of separately billable procedures and treating other patients.  Critical care was necessary to treat or prevent imminent or life-threatening deterioration.  Critical care was time spent personally by me on the following activities: development of treatment plan with patient and/or surrogate as well as nursing, discussions with consultants, evaluation of patient's response to treatment, examination of patient, obtaining history from patient or surrogate, ordering and performing treatments and interventions, ordering and review of laboratory  studies, ordering and review of radiographic studies, pulse oximetry and re-evaluation of patient's condition.  CENTRAL LINE Performed by: Joni Fears, Nechama Escutia Consent: The procedure was performed in an emergent situation. verbal consent obtained from patient and spouse.  Required items: required blood products, implants, devices, and special equipment available Patient identity confirmed: arm band and provided demographic data Time out: Immediately prior to procedure a "time out" was called to verify the correct patient, procedure, equipment, support staff and site/side marked as required. Indications: vascular access, vasopressor administration  Anesthesia: local infiltration Local anesthetic: lidocaine 1% with epinephrine Anesthetic total: 3 ml Patient sedated: no Preparation: skin prepped with 2% chlorhexidine Skin prep agent dried: skin prep agent completely dried prior to procedure Sterile barriers: all five maximum sterile barriers used - cap, mask, sterile gown, sterile gloves, and large sterile sheet. Placed on patient to cover her hair as well.  Hand hygiene: hand hygiene performed prior to central venous catheter insertion  Location details: Right internal jugular vein   Catheter type: triple lumen Catheter size: 8 Fr Pre-procedure: landmarks identified Ultrasound guidance: Yes, continuous real-time visualization. Guidewire placement confirmed with ultrasound prior to catheter insertion.  Successful placement: yes Post-procedure: line sutured and dressing applied Assessment: blood return through all parts, free fluid flow, placement verified by x-ray and no pneumothorax on x-ray. X-ray does show that catheter is inserted into the right atrium and will be withdrawn.  Patient tolerance: Patient tolerated the procedure well with no immediate complications.  ____________________________________________   INITIAL IMPRESSION / ASSESSMENT AND PLAN / ED COURSE  Pertinent labs &  imaging results that were available during my care of the patient were reviewed by me and considered in my medical decision making (see chart for details).  Patient presents with hypoxia and hypotension. Initially she was fluid responsive after 2 L rapid bolus that increased her blood pressure from 60/30 at 3:15 PM to 115/60 at 3:40 PM. At the conclusion of that 2 L bolus her blood pressure rapidly started declining again to about 70/40 again. At that time I discussed with the patient and her husband the need for vasopressor support in a central line. They consented verbally. They're familiar with the procedure as she just had this done during a recent hospitalization within the last month. Third liter saline bolus was given while procedure was completed. Patient was also given empiric vancomycin and Zosyn antibiotics and code sepsis was called immediately after her initial assessment.  I discussed the case with the hospitalist at 5:00 PM for admission. They request CT scan of the chest to further evaluate for infectious source. We'll also proceed with Foley placement. Only likely the patient has a urinary tract infection that could be resulting in this degree of sepsis and renal failure consistent with pyelonephritis. Due to her critical illness we'll proceed with CT scans as well to have  a better assessment of what we are treating. Low suspicion for meningitis or encephalitis. Patient also ordered runs of IV potassium chloride due to hypokalemia.    ____________________________________________   FINAL CLINICAL IMPRESSION(S) / ED DIAGNOSES  Final diagnoses:  Septic shock ([place])  HCAP (healthcare-associated pneumonia)  Acute renal failure, unspecified acute renal failure type ([place])  Hypokalemia  Hyponatremia      Carrie Mew, MD 03/11/15 1729

## 2015-03-11 NOTE — ED Notes (Addendum)
MD at bedside doing central line

## 2015-03-11 NOTE — H&P (Signed)
[facility] [place] [facility] [place] NAME: Natasha [name]    MR#:  [id number]  DATE OF BIRTH:  1929-01-04  DATE OF ADMISSION:  03/11/2015  PRIMARY CARE PHYSICIAN: Rusty Aus., MD   REQUESTING/REFERRING PHYSICIAN: Dr. Joni Fears  CHIEF COMPLAINT:   Found to have altered mental status and low blood pressure HISTORY OF PRESENT ILLNESS:  Natasha [name]  is a 82 y.o. female with a known history of severe peripheral vascular disease on Xarelto with recent angiogram of the lower extremity,, chronic lower extremity ulceration of the left digits, chronic anemia comes to the emergency room after she was found to be hypotensive with blood pressure 60/30 low-grade fever and altered mental status. Patient just finished a course of Levaquin for pneumonia. She has also history of large hiatal hernia. Code sepsis was initiated by Dr. Joni Fears. Patient received IV vancomycin and cefepime and later off IV fluid bolus Central line was placed and she was started on levo fed since her menopause less than 67. During my evaluation patient was on 10 mics of Levophed her map was 67 -Her creatinine was elevated to 1.85. Baseline creatinine is 0.8. It appears patient is clinically dehydrated. No source of infection is found however she is being empirically on antibiotics She is being admitted for possible sepsis.  PAST MEDICAL HISTORY:   Past Medical History  Diagnosis Date  . GERD (gastroesophageal reflux disease)   . Asthma   . Anemia   . Arthritis   . H/O blood clots 2014  . Gallstones 2015  . Hypertension     PAST SURGICAL HISTOIRY:   Past Surgical History  Procedure Laterality Date  . Abdominal hysterectomy  1960  . Appendectomy  1946  . Cochlear implant  2009  . Eye surgery  [phone]    cataract  . Stent placement  2006-2014    multiple stent placements in legs  . Colonoscopy  2015    Dr. Rayann Heman   . Cholecystectomy  04-24-13  . Breast biopsy  Bilateral     negative  . Peripheral vascular catheterization N/A 02/27/2015    Procedure: Abdominal Aortogram w/Lower Extremity;  Surgeon: Algernon Huxley, MD;  Location: [place];  Service: Cardiovascular;  Laterality: N/A;  . Peripheral vascular catheterization  02/27/2015    Procedure: Lower Extremity Intervention;  Surgeon: Algernon Huxley, MD;  Location: [place] [facility];  Service: Cardiovascular;;  . Peripheral vascular catheterization Left 02/28/2015    Procedure: Lower Extremity Angiography;  Surgeon: Algernon Huxley, MD;  Location: [place] [facility];  Service: Cardiovascular;  Laterality: Left;  . Peripheral vascular catheterization  02/28/2015    Procedure: Lower Extremity Intervention;  Surgeon: Algernon Huxley, MD;  Location: [place] [facility];  Service: Cardiovascular;;    SOCIAL HISTORY:   Social History  Substance Use Topics  . Smoking status: Former Research scientist (life sciences)  . Smokeless tobacco: Not on file  . Alcohol Use: No    FAMILY HISTORY:   Family History  Problem Relation Age of Onset  . Heart disease Mother   . Heart disease Father   . Cancer Sister     lung  . Breast cancer Daughter     DRUG ALLERGIES:   Allergies  Allergen Reactions  . Codeine Diarrhea  . Hydrocodone Nausea And Vomiting  . Tramadol Nausea And Vomiting    REVIEW OF SYSTEMS:  Review of Systems  Constitutional: Positive for fever. Negative for chills and diaphoresis.  HENT:  Negative for congestion, ear pain, hearing loss, nosebleeds and sore throat.   Eyes: Negative for blurred vision, double vision, photophobia and pain.  Respiratory: Negative for hemoptysis, sputum production, wheezing and stridor.   Cardiovascular: Negative for orthopnea, claudication and leg swelling.  Gastrointestinal: Negative for heartburn and abdominal pain.  Genitourinary: Negative for dysuria and frequency.  Musculoskeletal: Negative for back pain, joint pain and neck pain.  Skin: Negative for rash.  Neurological:  Negative for tingling, sensory change, speech change, focal weakness, seizures and headaches.       Transient altered mental status  Endo/Heme/Allergies: Does not bruise/bleed easily.  Psychiatric/Behavioral: Negative for suicidal ideas, memory loss and substance abuse. The patient is not nervous/anxious.   All other systems reviewed and are negative.    MEDICATIONS AT HOME:   Prior to Admission medications   Medication Sig Start Date End Date Taking? Authorizing Provider  albuterol (PROVENTIL HFA;VENTOLIN HFA) 108 (90 BASE) MCG/ACT inhaler Inhale 2 puffs into the lungs 2 (two) times daily.    Yes Historical Provider, MD  budesonide (PULMICORT) 180 MCG/ACT inhaler Inhale 2 puffs into the lungs 2 (two) times daily.   Yes Historical Provider, MD  Calcium-Magnesium-Vitamin D (CALCIUM 1200+D3 PO) Take 1 tablet by mouth daily.   Yes Historical Provider, MD  cilostazol (PLETAL) 100 MG tablet Take 200 mg by mouth 2 (two) times daily.    Yes Historical Provider, MD  diltiazem (DILACOR XR) 180 MG 24 hr capsule Take 180 mg by mouth daily.   Yes Historical Provider, MD  furosemide (LASIX) 20 MG tablet Take 20 mg by mouth 2 (two) times daily.   Yes Historical Provider, MD  gabapentin (NEURONTIN) 300 MG capsule Take 300 mg by mouth 2 (two) times daily.   Yes Historical Provider, MD  Melatonin 5 MG TABS Take 5-10 mg by mouth at bedtime as needed (for sleep).   Yes Historical Provider, MD  montelukast (SINGULAIR) 10 MG tablet Take 10 mg by mouth daily.   Yes Historical Provider, MD  omeprazole (PRILOSEC) 20 MG capsule Take 20 mg by mouth at bedtime.   Yes Historical Provider, MD  pramipexole (MIRAPEX) 0.25 MG tablet Take 0.25 mg by mouth 2 (two) times daily.   Yes Historical Provider, MD  rivaroxaban (XARELTO) 20 MG TABS tablet Take 20 mg by mouth daily with supper.   Yes Historical Provider, MD  senna-docusate (SENOKOT-S) 8.6-50 MG tablet Take 1-2 tablets by mouth daily as needed for mild constipation.    Yes Historical Provider, MD  simvastatin (ZOCOR) 20 MG tablet Take 20 mg by mouth daily.   Yes Historical Provider, MD  HYDROmorphone (DILAUDID) 2 MG tablet Take 1 tablet (2 mg total) by mouth 3 (three) times daily as needed for severe pain. Patient not taking: Reported on 02/27/2015 08/17/14 08/21/15  Daymon Larsen, MD  levofloxacin (LEVAQUIN) 750 MG tablet Take 1 tablet (750 mg total) by mouth daily. Patient not taking: Reported on 03/11/2015 03/03/15   Janalyn Harder Stegmayer, PA-C      VITAL SIGNS:  Blood pressure 111/47, pulse 92, temperature 99.1 F (37.3 C), temperature source Oral, resp. rate 16, height 4\' 11"  (1.499 m), weight 62.143 kg (137 lb), SpO2 94 %.  PHYSICAL EXAMINATION:  GENERAL:  82 y.o.-year-old patient lying in the bed with no acute distress. Appears chronically ill EYES: Pupils equal, round, reactive to light and accommodation. No scleral icterus. Extraocular muscles intact.  HEENT: Head atraumatic, normocephalic. Oropharynx and nasopharynx clear.  NECK:  Supple, no jugular venous  distention. No thyroid enlargement, no tenderness.  LUNGS: Normal breath sounds bilaterally, no wheezing, rales,rhonchi or crepitation. No use of accessory muscles of respiration.  CARDIOVASCULAR: S1, S2 normal. No murmurs, rubs, or gallops.  ABDOMEN: Soft, nontender, nondistended. Bowel sounds present. No organomegaly or mass.  EXTREMITIES: 2+ pedal edema, no cyanosis, or clubbing. Spider veins present NEUROLOGIC: Cranial nerves II through XII are intact. Muscle strength 4 /5 in all extremities. Sensation intact. Gait not checked.  subjective weakness PSYCHIATRIC: The patient is alert and oriented x 2.  SKIN: No obvious rash, lesion, or ulcer.   LABORATORY PANEL:   CBC  Recent Labs Lab 03/11/15 1511  WBC 11.5*  HGB 7.2*  HCT 22.1*  PLT 385   ------------------------------------------------------------------------------------------------------------------  Chemistries   Recent  Labs Lab 03/11/15 1511  NA 127*  K 2.4*  CL 84*  CO2 33*  GLUCOSE 136*  BUN 35*  CREATININE 1.85*  CALCIUM 8.1*  MG 1.7  AST 19  ALT 10*  ALKPHOS 76  BILITOT 0.5   ------------------------------------------------------------------------------------------------------------------  Cardiac Enzymes  Recent Labs Lab 03/11/15 1511  TROPONINI <0.03   ------------------------------------------------------------------------------------------------------------------  RADIOLOGY:  Ct Abdomen Pelvis Wo Contrast  03/11/2015  CLINICAL DATA:  Code Sepsis. Pt brought to ED via [facility] who picked her up at her residence at [facility]. Staff and husband state that she has had altered mental status since about 12:45 today. Pt has recently been treated with levoquin, likely for a respiratory infection, as patient reports frequent coughing. EXAM: CT CHEST, ABDOMEN AND PELVIS WITHOUT CONTRAST TECHNIQUE: Multidetector CT imaging of the chest, abdomen and pelvis was performed following the standard protocol without IV contrast. COMPARISON:  Abdomen CT, 06/07/2014. Multiple prior chest radiographs. FINDINGS: CT CHEST Neck base and axilla:  No mass or adenopathy. Mediastinum and hila: Large hernia with the stomach protruding to the right of midline into the right posterior in the thorax. Stomach is mildly dilated above this from the level of the Carina to the neck base. Heart is normal in size and configuration. There are mild coronary artery calcifications. Great vessels are normal in caliber. No mediastinal or hilar masses or pathologically enlarged lymph nodes. Lungs and pleura: Minimal pleural effusions. There is opacity in the dependent lower lobes and in the right lower lobe adjacent to the herniated stomach, most consistent atelectasis although pneumonia is possible. There is diffuse interstitial thickening. Calcified granuloma is noted in the right middle lobe. There is no convincing pneumonia.  No pneumothorax. CT ABDOMEN AND PELVIS Hepatobiliary: Normal liver. Gallbladder surgically absent. Bile duct normal in caliber for age. Spleen, pancreas, adrenal glands:  Unremarkable. Kidneys, ureters, bladder: 18 mm posterior midpole right renal mass consistent with a cyst. No other renal masses, no stones and no hydronephrosis. Ureters normal in course and in caliber. Normal bladder. Uterus and adnexa: Uterus surgically absent. No adnexal/ pelvic masses. Vascular: There is atherosclerotic calcification along the abdominal aorta and iliac vessels. No aneurysm. There has been previous vascular surgery with bilateral inguinal scarring and surgical clips. There is a left superficial femoral artery graft. Lymph nodes:  No adenopathy. Ascites:  None. Gastrointestinal: No evidence of bowel obstruction. No bowel dilation. No wall thickening. No mesenteric inflammation. Appendix surgically absent. MUSCULOSKELETAL No osteoblastic or osteolytic lesions. Bones are demineralized. Degenerative changes are noted throughout the visualized spine. IMPRESSION: 1. Minimal pleural effusions and bilateral interstitial thickening. Mild congestive heart failure suspected. 2. Additional opacity in the lower lobes most likely atelectasis. Pneumonia is not excluded but felt  less likely. 3. No acute findings in the abdomen pelvis. No evidence of a source of infection. Electronically Signed   By: Lajean Manes M.D.   On: 03/11/2015 17:44   Ct Chest Wo Contrast  03/11/2015  CLINICAL DATA:  Code Sepsis. Pt brought to ED via [facility] who picked her up at her residence at [facility]. Staff and husband state that she has had altered mental status since about 12:45 today. Pt has recently been treated with levoquin, likely for a respiratory infection, as patient reports frequent coughing. EXAM: CT CHEST, ABDOMEN AND PELVIS WITHOUT CONTRAST TECHNIQUE: Multidetector CT imaging of the chest, abdomen and pelvis was performed following the  standard protocol without IV contrast. COMPARISON:  Abdomen CT, 06/07/2014. Multiple prior chest radiographs. FINDINGS: CT CHEST Neck base and axilla:  No mass or adenopathy. Mediastinum and hila: Large hernia with the stomach protruding to the right of midline into the right posterior in the thorax. Stomach is mildly dilated above this from the level of the Carina to the neck base. Heart is normal in size and configuration. There are mild coronary artery calcifications. Great vessels are normal in caliber. No mediastinal or hilar masses or pathologically enlarged lymph nodes. Lungs and pleura: Minimal pleural effusions. There is opacity in the dependent lower lobes and in the right lower lobe adjacent to the herniated stomach, most consistent atelectasis although pneumonia is possible. There is diffuse interstitial thickening. Calcified granuloma is noted in the right middle lobe. There is no convincing pneumonia. No pneumothorax. CT ABDOMEN AND PELVIS Hepatobiliary: Normal liver. Gallbladder surgically absent. Bile duct normal in caliber for age. Spleen, pancreas, adrenal glands:  Unremarkable. Kidneys, ureters, bladder: 18 mm posterior midpole right renal mass consistent with a cyst. No other renal masses, no stones and no hydronephrosis. Ureters normal in course and in caliber. Normal bladder. Uterus and adnexa: Uterus surgically absent. No adnexal/ pelvic masses. Vascular: There is atherosclerotic calcification along the abdominal aorta and iliac vessels. No aneurysm. There has been previous vascular surgery with bilateral inguinal scarring and surgical clips. There is a left superficial femoral artery graft. Lymph nodes:  No adenopathy. Ascites:  None. Gastrointestinal: No evidence of bowel obstruction. No bowel dilation. No wall thickening. No mesenteric inflammation. Appendix surgically absent. MUSCULOSKELETAL No osteoblastic or osteolytic lesions. Bones are demineralized. Degenerative changes are noted  throughout the visualized spine. IMPRESSION: 1. Minimal pleural effusions and bilateral interstitial thickening. Mild congestive heart failure suspected. 2. Additional opacity in the lower lobes most likely atelectasis. Pneumonia is not excluded but felt less likely. 3. No acute findings in the abdomen pelvis. No evidence of a source of infection. Electronically Signed   By: Lajean Manes M.D.   On: 03/11/2015 17:44   Dg Chest Portable 1 View  03/11/2015  CLINICAL DATA:  Status post central line placement EXAM: PORTABLE CHEST 1 VIEW COMPARISON:  03/11/2015 FINDINGS: Cardiac shadow remains enlarged. A right jugular central line is noted with the catheter tip deep in the right atrium. This should be withdrawn approximately 5 cm. No pneumothorax is noted. Changes of large diaphragmatic hernia are again identified and stable. Some left basilar atelectasis and effusion is noted. IMPRESSION: Status post central line placement without pneumothorax. The line is deep in the right atrium and should be withdrawn 5 cm. Electronically Signed   By: Inez Catalina M.D.   On: 03/11/2015 16:55   Dg Chest Port 1 View  03/11/2015  CLINICAL DATA:  Altered mental status EXAM: PORTABLE CHEST 1  VIEW COMPARISON:  2/20/ 17 FINDINGS: Cardiomediastinal silhouette is stable. Again noted large right diaphragmatic/hiatal hernia. Stable mild elevation of the left hemidiaphragm. Left basilar atelectasis or scarring. No superimposed infiltrate or pulmonary edema. IMPRESSION: Again noted large right diaphragmatic/hiatal hernia. Stable mild elevation of the left hemidiaphragm. Left basilar atelectasis or scarring. No superimposed infiltrate or pulmonary edema. Electronically Signed   By: Lahoma Crocker M.D.   On: 03/11/2015 15:46    EKG:    IMPRESSION AND PLAN:   Natasha [name]  is a 82 y.o. female with a known history of severe peripheral vascular disease on Xarelto with recent angiogram of the lower extremity,, chronic lower extremity  ulceration of the left digits, chronic anemia comes to the emergency room after she was found to be hypotensive with blood pressure 60/30 low-grade fever and altered mental status  1. Low-grade fever, hypotension, altered mental status post transient suspect due to dehydration and hypovolemic shock with acute renal failure due to poor by mouth intake -Admit to ICU -IV fluids for hydration -Continue IV levothyroid and mean office map remains greater than 65 -Patient currently on empiric antibiotic with IV vancomycin and cefepime. So far no source of infection identified. Blood cultures remain negative and no spiking fever consider discontinuing antibiotics -Lactic acid normal  2. Acute renal failure likely ATN with poor by mouth intake -Patient also recently had angiogram for her for vascular disease this was done on February 20 by Dr. Lucky Cowboy -Monitor I's and O's monitor creatinine closely, avoid nephrotoxins -Baseline creatinine 0.8. Creatinine today 1.85  3. Hypotension hold her blood pressure meds  4. GERD continue PPI  5. DVT prophylaxis patient on Xarelto  6. Chronic anticoagulation for severe peripheral vascular disease -Since creatinine clearance is low will hold off tonight Xarelto. Pharmacy to consult. Once creatinine clearance improves resume Xarelto.  All the records are reviewed and case discussed with ED provider. Management plans discussed with the patient, family and they are in agreement.  CODE STATUS: full  TOTAL  Critical TIME TAKING CARE OF THIS PATIENT: 55 minutes.    Natasha [name] M.D on 03/11/2015 at 6:09 PM  Between 7am to 6pm - Pager - [phone]  After 6pm go to [email or web address] - password [email or web address]  [facility]  Office  [phone]  CC: Primary care physician; Rusty Aus., MD

## 2015-03-11 NOTE — ED Notes (Signed)
k 2.4 lab called for critial . MD notified at this time

## 2015-03-11 NOTE — Progress Notes (Signed)
eLink Physician-Brief Progress Note Patient Name: Natasha [name] DOB: 1928/01/20 MRN: [id number]   Date of Service  03/11/2015  HPI/Events of Note  Hypotension - BP 115/31 with MAP = 55. Patient is awake, alert and follows commands. Making urine.   eICU Interventions  Accept MAP >= 55.     Intervention Category Major Interventions: Hypotension - evaluation and management  Sommer,Steven Cornelia Copa 03/11/2015, 11:56 PM

## 2015-03-11 NOTE — ED Notes (Signed)
CODE SEPSIS CALLED TO DOUG AT [facility] 

## 2015-03-11 NOTE — Progress Notes (Signed)
Pharmacy Antibiotic Note  Natasha [name] is a 82 y.o. female admitted on 03/11/2015 with pneumonia.  Pharmacy has been consulted for vancomycin and cefepime dosing.  Plan: Patient ordered vancomycin 750mg  IV x 1 and cefepime 2g IV x 1 in ED.    Will start patient on cefepime 1g IV Q24hr with next dose on 3/1.   Will start vancomycin 750mg  IV Q36hr. Will schedule next dose for 1000 on 3/1. Will follow and obtain trough as clinically appropriate.   Height: 4\' 11"  (149.9 cm) Weight: 137 lb (62.143 kg) IBW/kg (Calculated) : 43.2  Temp (24hrs), Avg:99.1 F (37.3 C), Min:99.1 F (37.3 C), Max:99.1 F (37.3 C)   Recent Labs Lab 03/11/15 1510 03/11/15 1511  WBC  --  11.5*  CREATININE  --  1.85*  LATICACIDVEN 1.8  --     Estimated Creatinine Clearance: 18.2 mL/min (by C-G formula based on Cr of 1.85).    Allergies  Allergen Reactions  . Codeine Diarrhea  . Hydrocodone Nausea And Vomiting  . Tramadol Nausea And Vomiting    Antimicrobials this admission: Cefepime 2/28 >> Vancomycin 2/28 >>   Dose adjustments this admission: N/A  Microbiology results: 2/28 BCx x 2: pending  2/28 UCx: pending   2/28 Sputum: pending  2/28 MRSA PCR: pending   Pharmacy will continue to monitor and adjust per consult.    Simpson,Michael L 03/11/2015 4:24 PM

## 2015-03-11 NOTE — Progress Notes (Signed)
Pt is in stable condition. No complaints of pain at this time. Pt remains on O2 at 4L.  Report given to oncoming nurse. Joseph Art, R.N..

## 2015-03-11 NOTE — ED Notes (Signed)
Transported by Otila Kluver, RN and Waunita Schooner

## 2015-03-11 NOTE — Progress Notes (Signed)
eLink Physician-Brief Progress Note Patient Name: Natasha [name] DOB: [birth year] MRN: [id number]   Date of Service  03/11/2015  HPI/Events of Note  Admitted with AMS, possible sepsis, hypotension. Low K+  eICU Interventions  BP stable now on levophed. MS improved. Mild respiratory failure, continue oxygen. K+ infusing.         Laverle Hobby 03/11/2015, 6:55 PM

## 2015-03-11 NOTE — ED Notes (Signed)
Patient transported to [place] with Waunita Schooner, [employer]

## 2015-03-12 DIAGNOSIS — I959 Hypotension, unspecified: Secondary | ICD-10-CM

## 2015-03-12 LAB — COMPREHENSIVE METABOLIC PANEL
ALK PHOS: 72 U/L (ref 38–126)
ALT: 8 U/L — AB (ref 14–54)
AST: 15 U/L (ref 15–41)
Albumin: 2.5 g/dL — ABNORMAL LOW (ref 3.5–5.0)
Anion gap: 7 (ref 5–15)
BILIRUBIN TOTAL: 0.3 mg/dL (ref 0.3–1.2)
BUN: 24 mg/dL — AB (ref 6–20)
CALCIUM: 7.6 mg/dL — AB (ref 8.9–10.3)
CO2: 28 mmol/L (ref 22–32)
CREATININE: 1.19 mg/dL — AB (ref 0.44–1.00)
Chloride: 96 mmol/L — ABNORMAL LOW (ref 101–111)
GFR calc Af Amer: 47 mL/min — ABNORMAL LOW (ref >60)
GFR, EST NON AFRICAN AMERICAN: 41 mL/min — AB (ref >60)
Glucose, Bld: 182 mg/dL — ABNORMAL HIGH (ref 65–99)
POTASSIUM: 2.5 mmol/L — AB (ref 3.5–5.1)
Sodium: 131 mmol/L — ABNORMAL LOW (ref 135–145)
TOTAL PROTEIN: 5.5 g/dL — AB (ref 6.5–8.1)

## 2015-03-12 LAB — BASIC METABOLIC PANEL
Anion gap: 7 (ref 5–15)
BUN: 14 mg/dL (ref 6–20)
CO2: 26 mmol/L (ref 22–32)
CREATININE: 0.96 mg/dL (ref 0.44–1.00)
Calcium: 7.8 mg/dL — ABNORMAL LOW (ref 8.9–10.3)
Chloride: 98 mmol/L — ABNORMAL LOW (ref 101–111)
GFR calc Af Amer: >60 mL/min (ref >60)
GFR, EST NON AFRICAN AMERICAN: 53 mL/min — AB (ref >60)
GLUCOSE: 159 mg/dL — AB (ref 65–99)
Potassium: 2.9 mmol/L — CL (ref 3.5–5.1)
SODIUM: 131 mmol/L — AB (ref 135–145)

## 2015-03-12 LAB — MAGNESIUM: MAGNESIUM: 1.5 mg/dL — AB (ref 1.7–2.4)

## 2015-03-12 MED ORDER — RIVAROXABAN 10 MG PO TABS: 20.0000 mg | ORAL_TABLET | Freq: Every day | ORAL | Status: DC

## 2015-03-12 MED ORDER — RIVAROXABAN 15 MG PO TABS
15.0000 mg | ORAL_TABLET | Freq: Every day | ORAL | Status: DC
  Administered 2015-03-12 – 2015-03-13 (×2): 15 mg via ORAL
  Filled 2015-03-12 (×3): qty 1

## 2015-03-12 MED ORDER — POTASSIUM CHLORIDE CRYS ER 20 MEQ PO TBCR
40.0000 meq | EXTENDED_RELEASE_TABLET | Freq: Once | ORAL | Status: AC
  Administered 2015-03-12: 40 meq via ORAL
  Filled 2015-03-12: qty 2

## 2015-03-12 MED ORDER — SODIUM CHLORIDE 0.9 % IV BOLUS (SEPSIS)
1000.0000 mL | Freq: Once | INTRAVENOUS | Status: AC
  Administered 2015-03-12: 1000 mL via INTRAVENOUS

## 2015-03-12 MED ORDER — POTASSIUM CHLORIDE CRYS ER 20 MEQ PO TBCR
20.0000 meq | EXTENDED_RELEASE_TABLET | Freq: Two times a day (BID) | ORAL | Status: DC
  Administered 2015-03-12: 20 meq via ORAL
  Filled 2015-03-12: qty 1

## 2015-03-12 MED ORDER — POTASSIUM CHLORIDE 10 MEQ/50ML IV SOLN
10.0000 meq | INTRAVENOUS | Status: AC
  Administered 2015-03-12 – 2015-03-13 (×5): 10 meq via INTRAVENOUS
  Filled 2015-03-12 (×5): qty 50

## 2015-03-12 MED ORDER — POTASSIUM CHLORIDE CRYS ER 20 MEQ PO TBCR: 40.0000 meq | EXTENDED_RELEASE_TABLET | ORAL | Status: DC

## 2015-03-12 MED ORDER — POTASSIUM CHLORIDE 10 MEQ/100ML IV SOLN: 10.0000 meq | INTRAVENOUS | Status: DC

## 2015-03-12 MED ORDER — MAGNESIUM SULFATE 4 GM/100ML IV SOLN
4.0000 g | Freq: Once | INTRAVENOUS | Status: AC
  Administered 2015-03-12: 4 g via INTRAVENOUS
  Filled 2015-03-12: qty 100

## 2015-03-12 MED ORDER — POTASSIUM CHLORIDE 10 MEQ/50ML IV SOLN
10.0000 meq | Freq: Once | INTRAVENOUS | Status: AC
  Administered 2015-03-12: 10 meq via INTRAVENOUS
  Filled 2015-03-12: qty 50

## 2015-03-12 MED ORDER — ALBUTEROL SULFATE (2.5 MG/3ML) 0.083% IN NEBU
3.0000 mL | INHALATION_SOLUTION | Freq: Two times a day (BID) | RESPIRATORY_TRACT | Status: DC
  Administered 2015-03-12 – 2015-03-16 (×7): 3 mL via RESPIRATORY_TRACT
  Filled 2015-03-12 (×8): qty 3

## 2015-03-12 MED ORDER — POTASSIUM CHLORIDE 10 MEQ/50ML IV SOLN
10.0000 meq | INTRAVENOUS | Status: AC
Start: 2015-03-12 — End: 2015-03-12
  Administered 2015-03-12 (×6): 10 meq via INTRAVENOUS
  Filled 2015-03-12 (×6): qty 50

## 2015-03-12 MED ORDER — MAGNESIUM SULFATE 2 GM/50ML IV SOLN
2.0000 g | Freq: Once | INTRAVENOUS | Status: DC
  Filled 2015-03-12: qty 50

## 2015-03-12 NOTE — Progress Notes (Signed)
[facility] [place] NAME: Natasha [name]    MR#:  [id number]  DATE OF BIRTH:  1928-03-20  SUBJECTIVE:  CHIEF COMPLAINT:   Chief Complaint  Patient presents with  . Altered Mental Status    Completely alert and oriented now, On levophed drip, BP is stable now, tapering levophed.  REVIEW OF SYSTEMS:  CONSTITUTIONAL: No fever, fatigue or weakness.  EYES: No blurred or double vision.  EARS, NOSE, AND THROAT: No tinnitus or ear pain.  RESPIRATORY: No cough, shortness of breath, wheezing or hemoptysis.  CARDIOVASCULAR: No chest pain, orthopnea, edema.  GASTROINTESTINAL: No nausea, vomiting, diarrhea or abdominal pain.  GENITOURINARY: No dysuria, hematuria.  ENDOCRINE: No polyuria, nocturia,  HEMATOLOGY: No anemia, easy bruising or bleeding SKIN: No rash or lesion. MUSCULOSKELETAL: No joint pain or arthritis.   NEUROLOGIC: No tingling, numbness, weakness.  PSYCHIATRY: No anxiety or depression.   ROS  DRUG ALLERGIES:   Allergies  Allergen Reactions  . Codeine Diarrhea  . Hydrocodone Nausea And Vomiting  . Tramadol Nausea And Vomiting    VITALS:  Blood pressure 133/43, pulse 106, temperature 100.6 F (38.1 C), temperature source Core (Comment), resp. rate 15, height 4\' 11"  (1.499 m), weight 62.143 kg (137 lb), SpO2 95 %.  PHYSICAL EXAMINATION:   GENERAL: 82 y.o.-year-old patient lying in the bed with no acute distress.  EYES: Pupils equal, round, reactive to light and accommodation. No scleral icterus. Extraocular muscles intact.  HEENT: Head atraumatic, normocephalic. Oropharynx and nasopharynx clear.  NECK: Supple, no jugular venous distention. No thyroid enlargement, no tenderness.  LUNGS: Normal breath sounds bilaterally, no wheezing, rales,rhonchi or crepitation. No use of accessory muscles of respiration.  CARDIOVASCULAR: S1, S2 normal. No murmurs, rubs, or gallops.  ABDOMEN: Soft, nontender, nondistended.  Bowel sounds present. No organomegaly or mass.  EXTREMITIES: 2+ pedal edema, no cyanosis, or clubbing. Spider veins present NEUROLOGIC: Cranial nerves II through XII are intact. Muscle strength 4 /5 in all extremities. Sensation intact. Gait not checked. subjective weakness PSYCHIATRIC: The patient is alert and oriented x 3.  SKIN: No obvious rash, lesion, or ulcer.   Physical Exam LABORATORY PANEL:   CBC  Recent Labs Lab 03/11/15 1511  WBC 11.5*  HGB 7.2*  HCT 22.1*  PLT 385   ------------------------------------------------------------------------------------------------------------------  Chemistries   Recent Labs Lab 03/12/15 0409 03/12/15 1708  NA 131* 131*  K 2.5* 2.9*  CL 96* 98*  CO2 28 26  GLUCOSE 182* 159*  BUN 24* 14  CREATININE 1.19* 0.96  CALCIUM 7.6* 7.8*  MG  --  1.5*  AST 15  --   ALT 8*  --   ALKPHOS 72  --   BILITOT 0.3  --    ------------------------------------------------------------------------------------------------------------------  Cardiac Enzymes  Recent Labs Lab 03/11/15 1511  TROPONINI <0.03   ------------------------------------------------------------------------------------------------------------------  RADIOLOGY:  Ct Abdomen Pelvis Wo Contrast  03/11/2015  CLINICAL DATA:  Code Sepsis. Pt brought to ED via [facility] who picked her up at her residence at [facility]. Staff and husband state that she has had altered mental status since about 12:45 today. Pt has recently been treated with levoquin, likely for a respiratory infection, as patient reports frequent coughing. EXAM: CT CHEST, ABDOMEN AND PELVIS WITHOUT CONTRAST TECHNIQUE: Multidetector CT imaging of the chest, abdomen and pelvis was performed following the standard protocol without IV contrast. COMPARISON:  Abdomen CT, 06/07/2014. Multiple prior chest radiographs. FINDINGS: CT CHEST Neck base and axilla:  No mass  or adenopathy. Mediastinum and hila: Large hernia  with the stomach protruding to the right of midline into the right posterior in the thorax. Stomach is mildly dilated above this from the level of the Carina to the neck base. Heart is normal in size and configuration. There are mild coronary artery calcifications. Great vessels are normal in caliber. No mediastinal or hilar masses or pathologically enlarged lymph nodes. Lungs and pleura: Minimal pleural effusions. There is opacity in the dependent lower lobes and in the right lower lobe adjacent to the herniated stomach, most consistent atelectasis although pneumonia is possible. There is diffuse interstitial thickening. Calcified granuloma is noted in the right middle lobe. There is no convincing pneumonia. No pneumothorax. CT ABDOMEN AND PELVIS Hepatobiliary: Normal liver. Gallbladder surgically absent. Bile duct normal in caliber for age. Spleen, pancreas, adrenal glands:  Unremarkable. Kidneys, ureters, bladder: 18 mm posterior midpole right renal mass consistent with a cyst. No other renal masses, no stones and no hydronephrosis. Ureters normal in course and in caliber. Normal bladder. Uterus and adnexa: Uterus surgically absent. No adnexal/ pelvic masses. Vascular: There is atherosclerotic calcification along the abdominal aorta and iliac vessels. No aneurysm. There has been previous vascular surgery with bilateral inguinal scarring and surgical clips. There is a left superficial femoral artery graft. Lymph nodes:  No adenopathy. Ascites:  None. Gastrointestinal: No evidence of bowel obstruction. No bowel dilation. No wall thickening. No mesenteric inflammation. Appendix surgically absent. MUSCULOSKELETAL No osteoblastic or osteolytic lesions. Bones are demineralized. Degenerative changes are noted throughout the visualized spine. IMPRESSION: 1. Minimal pleural effusions and bilateral interstitial thickening. Mild congestive heart failure suspected. 2. Additional opacity in the lower lobes most likely  atelectasis. Pneumonia is not excluded but felt less likely. 3. No acute findings in the abdomen pelvis. No evidence of a source of infection. Electronically Signed   By: Lajean Manes M.D.   On: 03/11/2015 17:44   Ct Chest Wo Contrast  03/11/2015  CLINICAL DATA:  Code Sepsis. Pt brought to ED via [facility] who picked her up at her residence at [facility]. Staff and husband state that she has had altered mental status since about 12:45 today. Pt has recently been treated with levoquin, likely for a respiratory infection, as patient reports frequent coughing. EXAM: CT CHEST, ABDOMEN AND PELVIS WITHOUT CONTRAST TECHNIQUE: Multidetector CT imaging of the chest, abdomen and pelvis was performed following the standard protocol without IV contrast. COMPARISON:  Abdomen CT, 06/07/2014. Multiple prior chest radiographs. FINDINGS: CT CHEST Neck base and axilla:  No mass or adenopathy. Mediastinum and hila: Large hernia with the stomach protruding to the right of midline into the right posterior in the thorax. Stomach is mildly dilated above this from the level of the Carina to the neck base. Heart is normal in size and configuration. There are mild coronary artery calcifications. Great vessels are normal in caliber. No mediastinal or hilar masses or pathologically enlarged lymph nodes. Lungs and pleura: Minimal pleural effusions. There is opacity in the dependent lower lobes and in the right lower lobe adjacent to the herniated stomach, most consistent atelectasis although pneumonia is possible. There is diffuse interstitial thickening. Calcified granuloma is noted in the right middle lobe. There is no convincing pneumonia. No pneumothorax. CT ABDOMEN AND PELVIS Hepatobiliary: Normal liver. Gallbladder surgically absent. Bile duct normal in caliber for age. Spleen, pancreas, adrenal glands:  Unremarkable. Kidneys, ureters, bladder: 18 mm posterior midpole right renal mass consistent with a cyst. No other renal  masses,  no stones and no hydronephrosis. Ureters normal in course and in caliber. Normal bladder. Uterus and adnexa: Uterus surgically absent. No adnexal/ pelvic masses. Vascular: There is atherosclerotic calcification along the abdominal aorta and iliac vessels. No aneurysm. There has been previous vascular surgery with bilateral inguinal scarring and surgical clips. There is a left superficial femoral artery graft. Lymph nodes:  No adenopathy. Ascites:  None. Gastrointestinal: No evidence of bowel obstruction. No bowel dilation. No wall thickening. No mesenteric inflammation. Appendix surgically absent. MUSCULOSKELETAL No osteoblastic or osteolytic lesions. Bones are demineralized. Degenerative changes are noted throughout the visualized spine. IMPRESSION: 1. Minimal pleural effusions and bilateral interstitial thickening. Mild congestive heart failure suspected. 2. Additional opacity in the lower lobes most likely atelectasis. Pneumonia is not excluded but felt less likely. 3. No acute findings in the abdomen pelvis. No evidence of a source of infection. Electronically Signed   By: Lajean Manes M.D.   On: 03/11/2015 17:44   Dg Chest Portable 1 View  03/11/2015  CLINICAL DATA:  Status post central line placement EXAM: PORTABLE CHEST 1 VIEW COMPARISON:  03/11/2015 FINDINGS: Cardiac shadow remains enlarged. A right jugular central line is noted with the catheter tip deep in the right atrium. This should be withdrawn approximately 5 cm. No pneumothorax is noted. Changes of large diaphragmatic hernia are again identified and stable. Some left basilar atelectasis and effusion is noted. IMPRESSION: Status post central line placement without pneumothorax. The line is deep in the right atrium and should be withdrawn 5 cm. Electronically Signed   By: Inez Catalina M.D.   On: 03/11/2015 16:55   Dg Chest Port 1 View  03/11/2015  CLINICAL DATA:  Altered mental status EXAM: PORTABLE CHEST 1 VIEW COMPARISON:  2/20/ 17  FINDINGS: Cardiomediastinal silhouette is stable. Again noted large right diaphragmatic/hiatal hernia. Stable mild elevation of the left hemidiaphragm. Left basilar atelectasis or scarring. No superimposed infiltrate or pulmonary edema. IMPRESSION: Again noted large right diaphragmatic/hiatal hernia. Stable mild elevation of the left hemidiaphragm. Left basilar atelectasis or scarring. No superimposed infiltrate or pulmonary edema. Electronically Signed   By: Lahoma Crocker M.D.   On: 03/11/2015 15:46    ASSESSMENT AND PLAN:   Active Problems:   Hypotension  1. Low-grade fever, hypotension, altered mental status post transient suspect due to dehydration and hypovolemic shock with acute renal failure due to poor by mouth intake -IV fluids for hydration -Continue IV levothyroid and mean office map remains greater than 65 -Patient was on empiric antibiotic with IV vancomycin and cefepime. So far no source of infection identified. Blood cultures remain negative and no spiking fever  - consider discontinuing antibiotics -Lactic acid normal  2. Acute renal failure likely ATN with poor by mouth intake -Patient also recently had angiogram for her for vascular disease this was done on February 20 by Dr. Lucky Cowboy -Monitor I's and O's monitor creatinine closely, avoid nephrotoxins -Baseline creatinine 0.8. Creatinine today 1.85  3. Hypotension hold her blood pressure meds    On levopehd drip now.  4. GERD continue PPI  5. DVT prophylaxis patient on Xarelto  6. Chronic anticoagulation for severe peripheral vascular disease -Since creatinine clearance is low will hold off tonight Xarelto. Pharmacy to consult. Once creatinine clearance improves resume Xarelto.    All the records are reviewed and case discussed with Care Management/Social Workerr. Management plans discussed with the patient, family and they are in agreement.  CODE STATUS: full  TOTAL TIME TAKING CARE OF THIS PATIENT: 35 critical care  minutes.     POSSIBLE D/C IN 2-3 DAYS, DEPENDING ON CLINICAL CONDITION.   Vaughan Basta M.D on 03/12/2015   Between 7am to 6pm - Pager - [phone]  After 6pm go to [email or web address] - password [email or web address] [facility]  Office  [phone]  CC: Primary care physician; Rusty Aus, MD  Note: This dictation was prepared with Dragon dictation along with smaller phrase technology. Any transcriptional errors that result from this process are unintentional.

## 2015-03-12 NOTE — Progress Notes (Signed)
CRITICAL VALUE ALERT  Critical value received:  K+ 2.9   Date of notification:  03/12/2015   Time of notification:  [id number]    Critical value read back:Yes.    Nurse who received alert:  Kathyrn Drown   MD notified (1st page):  [phone]   Time of first page:  1835   MD notified (2nd page):  Time of second page:  Responding MD:  Warren Lacy  Time MD responded:

## 2015-03-12 NOTE — Progress Notes (Signed)
eLink Physician-Brief Progress Note Patient Name: MIYLAH KASZA DOB: Oct 18, 1928 MRN: [id number]   Date of Service  03/12/2015  HPI/Events of Note  K+ = 2.5 and Creatinine = 1.19.  eICU Interventions  Will replete K+ and recheck BMP at 2 PM.     Intervention Category Intermediate Interventions: Electrolyte abnormality - evaluation and management  Sommer,Steven Eugene 03/12/2015, 5:37 AM

## 2015-03-12 NOTE — Progress Notes (Signed)
  Pharmacy Consult for Electrolyte Monitoring and Replacement  Indication: hypokalemia  Allergies  Allergen Reactions  . Codeine Diarrhea  . Hydrocodone Nausea And Vomiting  . Tramadol Nausea And Vomiting    Patient Measurements: Height: 4\' 11"  (149.9 cm) Weight: 137 lb (62.143 kg) IBW/kg (Calculated) : 43.2  Vital Signs: Temp: 100.4 F (38 C) (03/01 0758) Temp Source: Core (Comment) (03/01 0758) BP: 133/38 mmHg (03/01 0758) Pulse Rate: 119 (03/01 0758) Intake/Output from previous day: 02/28 0701 - 03/01 0700 In: 4938.6 [I.V.:4838.6; IV Piggyback:100] Out: 2150 [Urine:2150] Intake/Output from this shift: Total I/O In: 390 [P.O.:240; I.V.:100; IV Piggyback:50] Out: 1400 [Urine:1400]  Labs:  Recent Labs  03/11/15 1511  WBC 11.5*  HGB 7.2*  HCT 22.1*  PLT 385  APTT 38*  INR 1.65     Recent Labs  03/11/15 1511 03/12/15 0409  NA 127* 131*  K 2.4* 2.5*  CL 84* 96*  CO2 33* 28  GLUCOSE 136* 182*  BUN 35* 24*  CREATININE 1.85* 1.19*  CALCIUM 8.1* 7.6*  MG 1.7  --   PHOS 2.7  --   PROT 5.7* 5.5*  ALBUMIN 2.7* 2.5*  AST 19 15  ALT 10* 8*  ALKPHOS 76 72  BILITOT 0.5 0.3   Estimated Creatinine Clearance: 28.2 mL/min (by C-G formula based on Cr of 1.19).    Recent Labs  03/11/15 1818  GLUCAP 160*    Medical History: Past Medical History  Diagnosis Date  . GERD (gastroesophageal reflux disease)   . Asthma   . Anemia   . Arthritis   . H/O blood clots 2014  . Gallstones 2015  . Hypertension     Medications:  Scheduled:  . albuterol  3 mL Inhalation BID  . budesonide (PULMICORT) nebulizer solution  0.5 mg Nebulization BID  . calcium-vitamin D  1 tablet Oral Daily  . ceFEPime (MAXIPIME) IV  1 g Intravenous Q24H  . cilostazol  200 mg Oral BID  . montelukast  10 mg Oral Daily  . pantoprazole  40 mg Oral Daily  . pramipexole  0.25 mg Oral BID  . rivaroxaban  15 mg Oral Q supper  . simvastatin  20 mg Oral Daily  . sodium chloride  1,000 mL  Intravenous Once   Infusions:  . sodium chloride 100 mL/hr at 03/12/15 0421  . norepinephrine (LEVOPHED) Adult infusion 20 mcg/min (03/12/15 [id number])    Assessment: Pharmacy consulted to assist in managing electrolytes in this 82 y/o F admitted with hypotension and altered mental status.   Plan:  Patient receieved 6 runs of KCl 10 meq iv this AM. Now awaiting f/u labs including potassium and magnesium for further replacement if necessary.   Pharmacy will continue to monitor and adjust per consult.    Ulice Dash D 03/12/2015,4:10 PM

## 2015-03-12 NOTE — NC FL2 (Signed)
[place] LEVEL OF CARE SCREENING TOOL     IDENTIFICATION  Patient Name: Natasha [name] Birthdate: 1928/03/09 Sex: female Admission Date (Current Location): 03/11/2015  [place] and [place] Number:  Engineering geologist and Address:  [facility], [address] [place], [place] [address]      Provider Number: [id number]  Attending Physician Name and Address:  Vaughan Basta, MD  Relative Name and Phone Number:       Current Level of Care: Hospital Recommended Level of Care: [place] Prior Approval Number:    Date Approved/Denied:   PASRR Number:  ([id number])  Discharge Plan: SNF    Current Diagnoses: Patient Active Problem List   Diagnosis Date Noted  . Hypotension 03/11/2015  . 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 Ischemic leg 02/27/2015  . Calculus of gallbladder with other cholecystitis, without mention of obstruction 04/20/2013    Orientation RESPIRATION BLADDER Height & Weight     Self, Time, Situation, Place  O2 (Nasal Cannula 4 (L/min) ) Continent Weight: 137 lb (62.143 kg) Height:  4\' 11"  (149.9 cm)  BEHAVIORAL SYMPTOMS/MOOD NEUROLOGICAL BOWEL NUTRITION STATUS   (None)  (None) Continent Diet (Carb Modified )  AMBULATORY STATUS COMMUNICATION OF NEEDS Skin   Extensive Assist Verbally Normal                       Personal Care Assistance Level of Assistance  Bathing, Feeding, Dressing Bathing Assistance: Limited assistance Feeding assistance: Independent Dressing Assistance: Limited assistance     Functional Limitations Info  Sight, Hearing, Speech Sight Info: Adequate Hearing Info: Adequate Speech Info: Adequate    SPECIAL CARE FACTORS FREQUENCY  PT (By licensed PT)     PT Frequency:  (5)              Contractures      Additional Factors Info  Code Status, Allergies Code Status Info:  (Full Code) Allergies Info:  (Codeine, Hydrocodone, Tramadol)           Current Medications (03/12/2015):  This is the current hospital active medication list Current Facility-Administered Medications  Medication Dose Route Frequency Provider Last Rate Last Dose  . 0.9 %  sodium chloride infusion   Intravenous Continuous Fritzi Mandes, MD 100 mL/hr at 03/12/15 1650    . acetaminophen (TYLENOL) tablet 650 mg  650 mg Oral Q6H PRN Fritzi Mandes, MD   650 mg at 03/12/15 1702   Or  . acetaminophen (TYLENOL) suppository 650 mg  650 mg Rectal Q6H PRN Fritzi Mandes, MD      . albuterol (PROVENTIL) (2.5 MG/3ML) 0.083% nebulizer solution 3 mL  3 mL Inhalation BID Flora Lipps, MD      . bisacodyl (DULCOLAX) EC tablet 5 mg  5 mg Oral Daily PRN Fritzi Mandes, MD      . budesonide (PULMICORT) nebulizer solution 0.5 mg  0.5 mg Nebulization BID Fritzi Mandes, MD   0.5 mg at 03/12/15 0758  . calcium-vitamin D (OSCAL WITH D) 500-200 MG-UNIT per tablet 1 tablet  1 tablet Oral Daily Fritzi Mandes, MD   1 tablet at 03/12/15 1218  . ceFEPIme (MAXIPIME) 1 g in dextrose 5 % 50 mL IVPB  1 g Intravenous Q24H Charlett Nose, RPH      . cilostazol (PLETAL) tablet 200 mg  200 mg Oral BID Fritzi Mandes, MD   200 mg at 03/12/15 1218  . montelukast (SINGULAIR) tablet 10 mg  10 mg Oral  Daily Fritzi Mandes, MD   10 mg at 03/12/15 1217  . norepinephrine (LEVOPHED) 4 mg in dextrose 5 % 250 mL (0.016 mg/mL) infusion  0-40 mcg/min Intravenous Titrated Fritzi Mandes, MD 75 mL/hr at 03/12/15 0841 20 mcg/min at 03/12/15 0841  . ondansetron (ZOFRAN) tablet 4 mg  4 mg Oral Q6H PRN Fritzi Mandes, MD       Or  . ondansetron (ZOFRAN) injection 4 mg  4 mg Intravenous Q6H PRN Fritzi Mandes, MD      . pantoprazole (PROTONIX) EC tablet 40 mg  40 mg Oral Daily Fritzi Mandes, MD   40 mg at 03/12/15 0856  . potassium chloride SA (K-DUR,KLOR-CON) CR tablet 20 mEq  20 mEq Oral BID Vaughan Basta, MD   20 mEq at 03/12/15 1702  . pramipexole (MIRAPEX) tablet 0.25 mg  0.25 mg Oral BID Fritzi Mandes, MD   0.25 mg at 03/12/15 1217  .  Rivaroxaban (XARELTO) tablet 15 mg  15 mg Oral Q supper Vaughan Basta, MD   15 mg at 03/12/15 1650  . senna-docusate (Senokot-S) tablet 1 tablet  1 tablet Oral QHS PRN Fritzi Mandes, MD      . senna-docusate (Senokot-S) tablet 1-2 tablet  1-2 tablet Oral Daily PRN Fritzi Mandes, MD      . simvastatin (ZOCOR) tablet 20 mg  20 mg Oral Daily Fritzi Mandes, MD   20 mg at 03/12/15 1218  . sodium chloride 0.9 % bolus 1,000 mL  1,000 mL Intravenous Once Flora Lipps, MD   1,000 mL at 03/12/15 1650     Discharge Medications: Please see discharge summary for a list of discharge medications.  Relevant Imaging Results:  Relevant Lab Results:   Additional Information  (SSN [id number])  Lorenso Quarry S[place]ins, LCSW

## 2015-03-12 NOTE — Progress Notes (Signed)
eLink Physician-Brief Progress Note Patient Name: Natasha [name] DOB: [birth year] MRN: [id number]   Date of Service  03/12/2015  HPI/Events of Note    eICU Interventions  Additional 29mEq KCL given      Intervention Category Minor Interventions: Electrolytes abnormality - evaluation and management  Eviana Sibilia S. 03/12/2015, 9:02 PM

## 2015-03-12 NOTE — Progress Notes (Signed)
  Pharmacy Consult for Electrolyte Monitoring and Replacement  Indication: hypokalemia  Allergies  Allergen Reactions  . Codeine Diarrhea  . Hydrocodone Nausea And Vomiting  . Tramadol Nausea And Vomiting    Patient Measurements: Height: 4\' 11"  (149.9 cm) Weight: 137 lb (62.143 kg) IBW/kg (Calculated) : 43.2  Vital Signs: Temp: 100.4 F (38 C) (03/01 0758) Temp Source: Core (Comment) (03/01 1700) BP: 168/48 mmHg (03/01 1700) Pulse Rate: 119 (03/01 0758) Intake/Output from previous day: 02/28 0701 - 03/01 0700 In: 4938.6 [I.V.:4838.6; IV Piggyback:100] Out: 2150 [Urine:2150] Intake/Output from this shift: Total I/O In: 390 [P.O.:240; I.V.:100; IV Piggyback:50] Out: 1400 [Urine:1400]  Labs:  Recent Labs  03/11/15 1511  WBC 11.5*  HGB 7.2*  HCT 22.1*  PLT 385  APTT 38*  INR 1.65     Recent Labs  03/11/15 1511 03/12/15 0409 03/12/15 1708  NA 127* 131* 131*  K 2.4* 2.5* 2.9*  CL 84* 96* 98*  CO2 33* 28 26  GLUCOSE 136* 182* 159*  BUN 35* 24* 14  CREATININE 1.85* 1.19* 0.96  CALCIUM 8.1* 7.6* 7.8*  MG 1.7  --  1.5*  PHOS 2.7  --   --   PROT 5.7* 5.5*  --   ALBUMIN 2.7* 2.5*  --   AST 19 15  --   ALT 10* 8*  --   ALKPHOS 76 72  --   BILITOT 0.5 0.3  --    Estimated Creatinine Clearance: 35 mL/min (by C-G formula based on Cr of 0.96).    Recent Labs  03/11/15 1818  GLUCAP 160*    Assessment: Pharmacy consulted to assist in managing electrolytes in this 82 y/o F admitted with hypotension and altered mental status.   Mg low @ 1.5 K remains low at 2.9 after receiving 6 runs of KCl 10 mEq today.  Plan:  Give magnesium 4 g IV x 1 dose Give KCl 10 mEq x 5 runs   Will check K/Mg/Phos with AM labs tomorrow.   Pharmacy will continue to monitor and adjust per consult.    Lenis Noon, PharmD Clinical Pharmacist 03/12/2015,6:23 PM

## 2015-03-12 NOTE — Progress Notes (Signed)
Pt is in stable condition at this time.  No complaints of pain at this time, prior report of headache, given tylenol, no further complaints. Pt remains on levophed, rate decreased to 79mcg/kg/min during my shift.  HR 109 Bp 123/45  Pt remains on 02, and is alet and oriented x4.  Report given to Hinton Dyer, on coming RN

## 2015-03-12 NOTE — Progress Notes (Signed)
Pharmacy Antibiotic Note  Natasha [name] is a 82 y.o. female admitted on 03/11/2015 with pneumonia.  Pharmacy has been consulted for vancomycin and cefepime dosing. MRSA PCR is negative.   Plan:   After discussion with Dr. Mortimer Fries, will d/c vancomycin.  Will continue patient on cefepime 1 g iv q 24 hoiurs. .    Height: 4\' 11"  (149.9 cm) Weight: 137 lb (62.143 kg) IBW/kg (Calculated) : 43.2  Temp (24hrs), Avg:99.7 F (37.6 C), Min:97.7 F (36.5 C), Max:100.4 F (38 C)   Recent Labs Lab 03/11/15 1510 03/11/15 1511 03/11/15 1825 03/12/15 0409  WBC  --  11.5*  --   --   CREATININE  --  1.85*  --  1.19*  LATICACIDVEN 1.8  --  0.8  --     Estimated Creatinine Clearance: 28.2 mL/min (by C-G formula based on Cr of 1.19).    Allergies  Allergen Reactions  . Codeine Diarrhea  . Hydrocodone Nausea And Vomiting  . Tramadol Nausea And Vomiting    Antimicrobials this admission: Cefepime 2/28 >> Vancomycin 2/28 >> 03/01  Dose adjustments this admission: N/A  Microbiology results: 2/28 BCx x 2: NGTD  2/28 UCx: NGTD   2/28 Sputum: unacceptable 2/28 MRSA PCR: negative  Pharmacy will continue to monitor and adjust per consult.    Ulice Dash D 03/12/2015 2:10 PM

## 2015-03-12 NOTE — Progress Notes (Signed)
ANTICOAGULATION CONSULT NOTE - Initial Consult  Pharmacy Consult for Xarelto Indication: PVD  Allergies  Allergen Reactions  . Codeine Diarrhea  . Hydrocodone Nausea And Vomiting  . Tramadol Nausea And Vomiting    Patient Measurements: Height: 4\' 11"  (149.9 cm) Weight: 137 lb (62.143 kg) IBW/kg (Calculated) : 43.2 Heparin Dosing Weight: na  Vital Signs: Temp: 100.4 F (38 C) (03/01 0758) Temp Source: Core (Comment) (03/01 0758) BP: 133/38 mmHg (03/01 0758) Pulse Rate: 119 (03/01 0758)  Labs:  Recent Labs  03/11/15 1511 03/12/15 0409  HGB 7.2*  --   HCT 22.1*  --   PLT 385  --   APTT 38*  --   LABPROT 19.5*  --   INR 1.65  --   CREATININE 1.85* 1.19*  TROPONINI <0.03  --     Estimated Creatinine Clearance: 28.2 mL/min (by C-G formula based on Cr of 1.19).   Medical History: Past Medical History  Diagnosis Date  . GERD (gastroesophageal reflux disease)   . Asthma   . Anemia   . Arthritis   . H/O blood clots 2014  . Gallstones 2015  . Hypertension     Medications:  Prescriptions prior to admission  Medication Sig Dispense Refill Last Dose  . albuterol (PROVENTIL HFA;VENTOLIN HFA) 108 (90 BASE) MCG/ACT inhaler Inhale 2 puffs into the lungs 2 (two) times daily.    03/11/2015 at Unknown time  . budesonide (PULMICORT) 180 MCG/ACT inhaler Inhale 2 puffs into the lungs 2 (two) times daily.   03/11/2015 at Unknown time  . Calcium-Magnesium-Vitamin D (CALCIUM 1200+D3 PO) Take 1 tablet by mouth daily.   03/11/2015 at Unknown time  . cilostazol (PLETAL) 100 MG tablet Take 200 mg by mouth 2 (two) times daily.    03/11/2015 at Unknown time  . diltiazem (DILACOR XR) 180 MG 24 hr capsule Take 180 mg by mouth daily.   03/11/2015 at Unknown time  . furosemide (LASIX) 20 MG tablet Take 20 mg by mouth 2 (two) times daily.   03/11/2015 at Unknown time  . gabapentin (NEURONTIN) 300 MG capsule Take 300 mg by mouth 2 (two) times daily.   03/11/2015 at Unknown time  . Melatonin 5 MG  TABS Take 5-10 mg by mouth at bedtime as needed (for sleep).   03/10/2015 at Unknown time  . montelukast (SINGULAIR) 10 MG tablet Take 10 mg by mouth daily.   03/11/2015 at Unknown time  . omeprazole (PRILOSEC) 20 MG capsule Take 20 mg by mouth at bedtime.   03/10/2015 at Unknown time  . pramipexole (MIRAPEX) 0.25 MG tablet Take 0.25 mg by mouth 2 (two) times daily.   03/11/2015 at Unknown time  . rivaroxaban (XARELTO) 20 MG TABS tablet Take 20 mg by mouth daily with supper.   03/10/2015 at 1700  . senna-docusate (SENOKOT-S) 8.6-50 MG tablet Take 1-2 tablets by mouth daily as needed for mild constipation.   Past Week at Unknown time  . simvastatin (ZOCOR) 20 MG tablet Take 20 mg by mouth daily.   03/11/2015 at Unknown time  . HYDROmorphone (DILAUDID) 2 MG tablet Take 1 tablet (2 mg total) by mouth 3 (three) times daily as needed for severe pain. (Patient not taking: Reported on 02/27/2015) 20 tablet 0 Not Taking at Unknown time  . levofloxacin (LEVAQUIN) 750 MG tablet Take 1 tablet (750 mg total) by mouth daily. (Patient not taking: Reported on 03/11/2015) 7 tablet 0     Assessment: Patient was taking Xarelto 20mg  daily PTA for PVD.  Patient with decreased renal function on admission.   SCr: 1.19, CrCl: 34 ml/min (actual body weight)    Plan:  Will resume Xarelto at lower dose of 15 mg daily with ARF improving.   Pharmacy will continue to monitor and adjust per consult.   Ulice Dash, PharmD Clinical Pharmacist   03/12/2015 2:06 PM

## 2015-03-12 NOTE — Care Management (Signed)
Patient with discharge from [facility] to [place] on 2/20 after intervention for occlusion of bypass.  She present this admission to ED with hypotension and placed in icu due to the need for pressors.  CSW is aware

## 2015-03-12 NOTE — H&P (Signed)
[facility]   PATIENT NAME: Kadeidre Sonneborn    MR#:  [id number]  DATE OF BIRTH:  Mar 11, 1928  DATE OF ADMISSION:  03/11/2015  PRIMARY CARE PHYSICIAN: Rusty Aus, MD   REQUESTING/REFERRING PHYSICIAN: Dr. Joni Fears  CHIEF COMPLAINT:   Found to have altered mental status and low blood pressure HISTORY OF PRESENT ILLNESS:  Avarose Rathore  is a 82 y.o. female with a known history of severe peripheral vascular disease on Xarelto with recent angiogram of the lower extremity,, chronic lower extremity ulceration of the left digits, chronic anemia comes to the emergency room after she was found to be hypotensive with blood pressure 60/30 low-grade fever and altered mental status.  Code sepsis was initiated. Her LA was 1.8-->0.8 Patient placed on vasopressors-unable to wean at this time  It appears patient is clinically dehydrated. No source of infection is found however she is being empirically on antibiotics   PAST MEDICAL HISTORY:   Past Medical History  Diagnosis Date  . GERD (gastroesophageal reflux disease)   . Asthma   . Anemia   . Arthritis   . H/O blood clots 2014  . Gallstones 2015  . Hypertension     PAST SURGICAL HISTOIRY:   Past Surgical History  Procedure Laterality Date  . Abdominal hysterectomy  1960  . Appendectomy  1946  . Cochlear implant  2009  . Eye surgery  [phone]    cataract  . Stent placement  2006-2014    multiple stent placements in legs  . Colonoscopy  2015    Dr. Rayann Heman   . Cholecystectomy  04-24-13  . Breast biopsy Bilateral     negative  . Peripheral vascular catheterization N/A 02/27/2015    Procedure: Abdominal Aortogram w/Lower Extremity;  Surgeon: Algernon Huxley, MD;  Location: [facility];  Service: Cardiovascular;  Laterality: N/A;  . Peripheral vascular catheterization  02/27/2015    Procedure: Lower Extremity Intervention;  Surgeon: Algernon Huxley, MD;  Location: [facility];  Service: Cardiovascular;;  .  Peripheral vascular catheterization Left 02/28/2015    Procedure: Lower Extremity Angiography;  Surgeon: Algernon Huxley, MD;  Location: [facility];  Service: Cardiovascular;  Laterality: Left;  . Peripheral vascular catheterization  02/28/2015    Procedure: Lower Extremity Intervention;  Surgeon: Algernon Huxley, MD;  Location: [facility];  Service: Cardiovascular;;    SOCIAL HISTORY:   Social History  Substance Use Topics  . Smoking status: Former Research scientist (life sciences)  . Smokeless tobacco: Not on file  . Alcohol Use: No    FAMILY HISTORY:   Family History  Problem Relation Age of Onset  . Heart disease Mother   . Heart disease Father   . Cancer Sister     lung  . Breast cancer Daughter     DRUG ALLERGIES:   Allergies  Allergen Reactions  . Codeine Diarrhea  . Hydrocodone Nausea And Vomiting  . Tramadol Nausea And Vomiting    REVIEW OF SYSTEMS:  Review of Systems  Constitutional: Positive for fever. Negative for chills and diaphoresis.  HENT: Negative for congestion, ear pain, hearing loss, nosebleeds and sore throat.   Eyes: Negative for blurred vision, double vision, photophobia and pain.  Respiratory: Negative for hemoptysis, sputum production, wheezing and stridor.   Cardiovascular: Negative for orthopnea, claudication and leg swelling.  Gastrointestinal: Negative for heartburn and abdominal pain.  Genitourinary: Negative for dysuria and frequency.  Musculoskeletal: Negative for back pain, joint pain and neck pain.  Skin: Negative for rash.  Neurological: Negative for tingling, sensory change, speech change, focal weakness, seizures and headaches.       Transient altered mental status  Endo/Heme/Allergies: Does not bruise/bleed easily.  Psychiatric/Behavioral: Negative for suicidal ideas, memory loss and substance abuse. The patient is not nervous/anxious.   All other systems reviewed and are negative.    MEDICATIONS AT HOME:   Prior to Admission medications    Medication Sig Start Date End Date Taking? Authorizing Provider  albuterol (PROVENTIL HFA;VENTOLIN HFA) 108 (90 BASE) MCG/ACT inhaler Inhale 2 puffs into the lungs 2 (two) times daily.    Yes Historical Provider, MD  budesonide (PULMICORT) 180 MCG/ACT inhaler Inhale 2 puffs into the lungs 2 (two) times daily.   Yes Historical Provider, MD  Calcium-Magnesium-Vitamin D (CALCIUM 1200+D3 PO) Take 1 tablet by mouth daily.   Yes Historical Provider, MD  cilostazol (PLETAL) 100 MG tablet Take 200 mg by mouth 2 (two) times daily.    Yes Historical Provider, MD  diltiazem (DILACOR XR) 180 MG 24 hr capsule Take 180 mg by mouth daily.   Yes Historical Provider, MD  furosemide (LASIX) 20 MG tablet Take 20 mg by mouth 2 (two) times daily.   Yes Historical Provider, MD  gabapentin (NEURONTIN) 300 MG capsule Take 300 mg by mouth 2 (two) times daily.   Yes Historical Provider, MD  Melatonin 5 MG TABS Take 5-10 mg by mouth at bedtime as needed (for sleep).   Yes Historical Provider, MD  montelukast (SINGULAIR) 10 MG tablet Take 10 mg by mouth daily.   Yes Historical Provider, MD  omeprazole (PRILOSEC) 20 MG capsule Take 20 mg by mouth at bedtime.   Yes Historical Provider, MD  pramipexole (MIRAPEX) 0.25 MG tablet Take 0.25 mg by mouth 2 (two) times daily.   Yes Historical Provider, MD  rivaroxaban (XARELTO) 20 MG TABS tablet Take 20 mg by mouth daily with supper.   Yes Historical Provider, MD  senna-docusate (SENOKOT-S) 8.6-50 MG tablet Take 1-2 tablets by mouth daily as needed for mild constipation.   Yes Historical Provider, MD  simvastatin (ZOCOR) 20 MG tablet Take 20 mg by mouth daily.   Yes Historical Provider, MD  HYDROmorphone (DILAUDID) 2 MG tablet Take 1 tablet (2 mg total) by mouth 3 (three) times daily as needed for severe pain. Patient not taking: Reported on 02/27/2015 08/17/14 08/21/15  Daymon Larsen, MD  levofloxacin (LEVAQUIN) 750 MG tablet Take 1 tablet (750 mg total) by mouth daily. Patient not  taking: Reported on 03/11/2015 03/03/15   Janalyn Harder Stegmayer, PA-C      VITAL SIGNS:  Blood pressure 133/38, pulse 119, temperature 100.4 F (38 C), temperature source Core (Comment), resp. rate 17, height 4\' 11"  (1.499 m), weight 137 lb (62.143 kg), SpO2 94 %.  PHYSICAL EXAMINATION:  GENERAL:  82 y.o.-year-old patient lying in the bed with no acute distress. Appears chronically ill EYES: Pupils equal, round, reactive to light and accommodation. No scleral icterus. Extraocular muscles intact.  HEENT: Head atraumatic, normocephalic. Oropharynx and nasopharynx clear.  NECK:  Supple, no jugular venous distention. No thyroid enlargement, no tenderness.  LUNGS: Normal breath sounds bilaterally, no wheezing, rales,rhonchi or crepitation. No use of accessory muscles of respiration.  CARDIOVASCULAR: S1, S2 normal. No murmurs, rubs, or gallops.  ABDOMEN: Soft, nontender, nondistended. Bowel sounds present. No organomegaly or mass.  EXTREMITIES: 2+ pedal edema, no cyanosis, or clubbing. Spider veins present NEUROLOGIC: Cranial nerves II through XII are intact. Muscle strength 4 /  5 in all extremities. Sensation intact. Gait not checked.  subjective weakness PSYCHIATRIC: The patient is alert and oriented x 3.  SKIN: No obvious rash, lesion, or ulcer.   LABORATORY PANEL:   CBC  Recent Labs Lab 03/11/15 1511  WBC 11.5*  HGB 7.2*  HCT 22.1*  PLT 385   ------------------------------------------------------------------------------------------------------------------  Chemistries   Recent Labs Lab 03/11/15 1511 03/12/15 0409  NA 127* 131*  K 2.4* 2.5*  CL 84* 96*  CO2 33* 28  GLUCOSE 136* 182*  BUN 35* 24*  CREATININE 1.85* 1.19*  CALCIUM 8.1* 7.6*  MG 1.7  --   AST 19 15  ALT 10* 8*  ALKPHOS 76 72  BILITOT 0.5 0.3   ------------------------------------------------------------------------------------------------------------------  Cardiac Enzymes  Recent Labs Lab  03/11/15 1511  TROPONINI <0.03   ------------------------------------------------------------------------------------------------------------------  RADIOLOGY:  Ct Abdomen Pelvis Wo Contrast  03/11/2015  CLINICAL DATA:  Code Sepsis. Pt brought to ED via [facility] who picked her up at her residence at [facility]. Staff and husband state that she has had altered mental status since about 12:45 today. Pt has recently been treated with levoquin, likely for a respiratory infection, as patient reports frequent coughing. EXAM: CT CHEST, ABDOMEN AND PELVIS WITHOUT CONTRAST TECHNIQUE: Multidetector CT imaging of the chest, abdomen and pelvis was performed following the standard protocol without IV contrast. COMPARISON:  Abdomen CT, 06/07/2014. Multiple prior chest radiographs. FINDINGS: CT CHEST Neck base and axilla:  No mass or adenopathy. Mediastinum and hila: Large hernia with the stomach protruding to the right of midline into the right posterior in the thorax. Stomach is mildly dilated above this from the level of the Carina to the neck base. Heart is normal in size and configuration. There are mild coronary artery calcifications. Great vessels are normal in caliber. No mediastinal or hilar masses or pathologically enlarged lymph nodes. Lungs and pleura: Minimal pleural effusions. There is opacity in the dependent lower lobes and in the right lower lobe adjacent to the herniated stomach, most consistent atelectasis although pneumonia is possible. There is diffuse interstitial thickening. Calcified granuloma is noted in the right middle lobe. There is no convincing pneumonia. No pneumothorax. CT ABDOMEN AND PELVIS Hepatobiliary: Normal liver. Gallbladder surgically absent. Bile duct normal in caliber for age. Spleen, pancreas, adrenal glands:  Unremarkable. Kidneys, ureters, bladder: 18 mm posterior midpole right renal mass consistent with a cyst. No other renal masses, no stones and no hydronephrosis.  Ureters normal in course and in caliber. Normal bladder. Uterus and adnexa: Uterus surgically absent. No adnexal/ pelvic masses. Vascular: There is atherosclerotic calcification along the abdominal aorta and iliac vessels. No aneurysm. There has been previous vascular surgery with bilateral inguinal scarring and surgical clips. There is a left superficial femoral artery graft. Lymph nodes:  No adenopathy. Ascites:  None. Gastrointestinal: No evidence of bowel obstruction. No bowel dilation. No wall thickening. No mesenteric inflammation. Appendix surgically absent. MUSCULOSKELETAL No osteoblastic or osteolytic lesions. Bones are demineralized. Degenerative changes are noted throughout the visualized spine. IMPRESSION: 1. Minimal pleural effusions and bilateral interstitial thickening. Mild congestive heart failure suspected. 2. Additional opacity in the lower lobes most likely atelectasis. Pneumonia is not excluded but felt less likely. 3. No acute findings in the abdomen pelvis. No evidence of a source of infection. Electronically Signed   By: Lajean Manes M.D.   On: 03/11/2015 17:44   Ct Chest Wo Contrast  03/11/2015  CLINICAL DATA:  Code Sepsis. Pt brought to ED via [facility]  who picked her up at her residence at [facility]. Staff and husband state that she has had altered mental status since about 12:45 today. Pt has recently been treated with levoquin, likely for a respiratory infection, as patient reports frequent coughing. EXAM: CT CHEST, ABDOMEN AND PELVIS WITHOUT CONTRAST TECHNIQUE: Multidetector CT imaging of the chest, abdomen and pelvis was performed following the standard protocol without IV contrast. COMPARISON:  Abdomen CT, 06/07/2014. Multiple prior chest radiographs. FINDINGS: CT CHEST Neck base and axilla:  No mass or adenopathy. Mediastinum and hila: Large hernia with the stomach protruding to the right of midline into the right posterior in the thorax. Stomach is mildly dilated above  this from the level of the Carina to the neck base. Heart is normal in size and configuration. There are mild coronary artery calcifications. Great vessels are normal in caliber. No mediastinal or hilar masses or pathologically enlarged lymph nodes. Lungs and pleura: Minimal pleural effusions. There is opacity in the dependent lower lobes and in the right lower lobe adjacent to the herniated stomach, most consistent atelectasis although pneumonia is possible. There is diffuse interstitial thickening. Calcified granuloma is noted in the right middle lobe. There is no convincing pneumonia. No pneumothorax. CT ABDOMEN AND PELVIS Hepatobiliary: Normal liver. Gallbladder surgically absent. Bile duct normal in caliber for age. Spleen, pancreas, adrenal glands:  Unremarkable. Kidneys, ureters, bladder: 18 mm posterior midpole right renal mass consistent with a cyst. No other renal masses, no stones and no hydronephrosis. Ureters normal in course and in caliber. Normal bladder. Uterus and adnexa: Uterus surgically absent. No adnexal/ pelvic masses. Vascular: There is atherosclerotic calcification along the abdominal aorta and iliac vessels. No aneurysm. There has been previous vascular surgery with bilateral inguinal scarring and surgical clips. There is a left superficial femoral artery graft. Lymph nodes:  No adenopathy. Ascites:  None. Gastrointestinal: No evidence of bowel obstruction. No bowel dilation. No wall thickening. No mesenteric inflammation. Appendix surgically absent. MUSCULOSKELETAL No osteoblastic or osteolytic lesions. Bones are demineralized. Degenerative changes are noted throughout the visualized spine. IMPRESSION: 1. Minimal pleural effusions and bilateral interstitial thickening. Mild congestive heart failure suspected. 2. Additional opacity in the lower lobes most likely atelectasis. Pneumonia is not excluded but felt less likely. 3. No acute findings in the abdomen pelvis. No evidence of a source of  infection. Electronically Signed   By: Lajean Manes M.D.   On: 03/11/2015 17:44   Dg Chest Portable 1 View  03/11/2015  CLINICAL DATA:  Status post central line placement EXAM: PORTABLE CHEST 1 VIEW COMPARISON:  03/11/2015 FINDINGS: Cardiac shadow remains enlarged. A right jugular central line is noted with the catheter tip deep in the right atrium. This should be withdrawn approximately 5 cm. No pneumothorax is noted. Changes of large diaphragmatic hernia are again identified and stable. Some left basilar atelectasis and effusion is noted. IMPRESSION: Status post central line placement without pneumothorax. The line is deep in the right atrium and should be withdrawn 5 cm. Electronically Signed   By: Inez Catalina M.D.   On: 03/11/2015 16:55   Dg Chest Port 1 View  03/11/2015  CLINICAL DATA:  Altered mental status EXAM: PORTABLE CHEST 1 VIEW COMPARISON:  2/20/ 17 FINDINGS: Cardiomediastinal silhouette is stable. Again noted large right diaphragmatic/hiatal hernia. Stable mild elevation of the left hemidiaphragm. Left basilar atelectasis or scarring. No superimposed infiltrate or pulmonary edema. IMPRESSION: Again noted large right diaphragmatic/hiatal hernia. Stable mild elevation of the left hemidiaphragm. Left basilar atelectasis or  scarring. No superimposed infiltrate or pulmonary edema. Electronically Signed   By: Lahoma Crocker M.D.   On: 03/11/2015 15:46    IMPRESSION AND PLAN:   Anshi Rygg  is a 82 y.o. female with a known history of severe peripheral vascular disease on Xarelto with recent angiogram of the lower extremity,, chronic lower extremity ulceration of the left digits, chronic anemia comes to the emergency room after she was found to be hypotensive with blood pressure 60/30 low-grade fever and altered mental status likely from hypovolumic shock  1. Low-grade fever, hypotension, altered mental status post transient suspect due to dehydration and hypovolemic shock with acute renal  failure due to poor by mouth intake -IV fluids for hydration -Continue IV levophed MAP>greater than 65 -Lactic acid normal -CVP is 5=willgive extra IVF bolus  2. Acute renal failure likely ATN with poor by mouth intake -Monitor I's and O's monitor creatinine closely, avoid nephrotoxins -Baseline creatinine 0.8. Creatinine today 1.85  3. Hypotension hold her blood pressure meds   I have personally obtained a history, examined the patient, evaluated Pertinent laboratory and RadioGraphic/imaging results, and  formulated the assessment and plan   The Patient requires high complexity decision making for assessment and support, frequent evaluation and titration of therapies. Patient  satisfied with Plan of action and management. All questions answered  Corrin Parker, M.D.  [facility]  Medical Director [place] Director [facility] Cardio-P[facility] Department

## 2015-03-12 NOTE — Clinical Social Work Note (Signed)
Clinical Social Work Assessment  Patient Details  Name: Natasha [name] MRN: [id number] Date of Birth: 1928/04/20  Date of referral:  03/12/15               Reason for consult:  Discharge Planning                Permission sought to share information with:  Family Supports Permission granted to share information::  Yes, Verbal Permission Granted  Name::        Agency::     Relationship::   Natasha [name]- Spouse [phone])  Contact Information:     Housing/Transportation Living arrangements for the past 2 months:  [place] of Information:  Patient Patient Interpreter Needed:  None Criminal Activity/Legal Involvement Pertinent to Current Situation/Hospitalization:  No - Comment as needed Significant Relationships:  Spouse Natasha [name]- Spouse [phone]) Lives with:  Spouse Do you feel safe going back to the place where you live?  Yes Need for family participation in patient care:  Yes (Comment) Natasha [name]- Spouse [phone])  Care giving concerns:  Patient is from [facility].    Social Worker assessment / plan:  CSW was consulted due to patient being a resident at [facility]. CSW met with patient at bedside. CSW explained her role. Per patient she is from [facility] and would like to return at discharge. She reports that she was receiving STR from [facility] and does not feel that she's strong enough to return home at this time. Patient reports that her husband is 64 years old and cannot care for her at this time. Verbal permission granted for CSW to speak to patient's husband but patient informed CSW that he's "hard at hearing". Verbal permission granted for CSW to coordinate discharge with [facility].   FL2/ PASRR completed and faxed to [facility].   CSW spoke to Flowella- admissions Warehouse manager at [facility]. She reports that patient can return at discharge. CSW will continue to follow and assist.  Ernest Pine, MSW,  LCSW-A Clinical Social Work Department [phone]  Employment status:  Retired Forensic scientist:  Medicare PT Recommendations:  [place] / Referral to community resources:  [place]  Patient/Family's Response to care:  Patient is in agreement to return to [facility] at discharge.   Patient/Family's Understanding of and Emotional Response to Diagnosis, Current Treatment, and Prognosis:  Patient reports that she understands CSW's role and is appreciative of her assistance.   Emotional Assessment Appearance:  Appears stated age Attitude/Demeanor/Rapport:   (None) Affect (typically observed):  Accepting, Calm, Pleasant Orientation:  Oriented to Place, Oriented to Self, Oriented to  Time, Oriented to Situation Alcohol / Substance use:  Not Applicable Psych involvement (Current and /or in the community):  No (Comment)  Discharge Needs  Concerns to be addressed:  Discharge Planning Concerns Readmission within the last 30 days:  No Current discharge risk:  Chronically ill Barriers to Discharge:  Continued Medical Work up   [employer], LCSW 03/12/2015, 5:04 PM

## 2015-03-13 ENCOUNTER — Inpatient Hospital Stay: Payer: Medicare Other

## 2015-03-13 LAB — BASIC METABOLIC PANEL
ANION GAP: 6 (ref 5–15)
BUN: 11 mg/dL (ref 6–20)
CO2: 25 mmol/L (ref 22–32)
Calcium: 7.7 mg/dL — ABNORMAL LOW (ref 8.9–10.3)
Chloride: 101 mmol/L (ref 101–111)
Creatinine, Ser: 0.86 mg/dL (ref 0.44–1.00)
GFR calc Af Amer: >60 mL/min (ref >60)
GFR calc non Af Amer: >60 mL/min (ref >60)
Glucose, Bld: 125 mg/dL — ABNORMAL HIGH (ref 65–99)
POTASSIUM: 4.1 mmol/L (ref 3.5–5.1)
SODIUM: 132 mmol/L — AB (ref 135–145)

## 2015-03-13 LAB — EXPECTORATED SPUTUM ASSESSMENT W GRAM STAIN, RFLX TO RESP C

## 2015-03-13 LAB — URINE CULTURE: CULTURE: NO GROWTH

## 2015-03-13 LAB — PHOSPHORUS: Phosphorus: 1.1 mg/dL — ABNORMAL LOW (ref 2.5–4.6)

## 2015-03-13 LAB — MAGNESIUM: Magnesium: 2.4 mg/dL (ref 1.7–2.4)

## 2015-03-13 LAB — EXPECTORATED SPUTUM ASSESSMENT W REFEX TO RESP CULTURE

## 2015-03-13 MED ORDER — SODIUM CHLORIDE 0.9 % IV BOLUS (SEPSIS)
1000.0000 mL | Freq: Once | INTRAVENOUS | Status: AC
  Administered 2015-03-13: 1000 mL via INTRAVENOUS

## 2015-03-13 MED ORDER — SODIUM PHOSPHATE 3 MMOLE/ML IV SOLN
30.0000 mmol | Freq: Once | INTRAVENOUS | Status: DC
  Filled 2015-03-13: qty 10

## 2015-03-13 NOTE — Progress Notes (Signed)
ANTICOAGULATION CONSULT NOTE - Initial Consult  Pharmacy Consult for Xarelto Indication: PVD  Allergies  Allergen Reactions  . Codeine Diarrhea  . Hydrocodone Nausea And Vomiting  . Tramadol Nausea And Vomiting    Patient Measurements: Height: 4\' 11"  (149.9 cm) Weight: 137 lb (62.143 kg) IBW/kg (Calculated) : 43.2 Heparin Dosing Weight: na  Vital Signs: Temp: 99.7 F (37.6 C) (03/02 1000) Temp Source: Oral (03/02 0000) BP: 147/48 mmHg (03/02 1000) Pulse Rate: 71 (03/02 1000)  Labs:  Recent Labs  03/11/15 1511 03/12/15 0409 03/12/15 1708 03/13/15 0456  HGB 7.2*  --   --   --   HCT 22.1*  --   --   --   PLT 385  --   --   --   APTT 38*  --   --   --   LABPROT 19.5*  --   --   --   INR 1.65  --   --   --   CREATININE 1.85* 1.19* 0.96 0.86  TROPONINI <0.03  --   --   --     Estimated Creatinine Clearance: 39.1 mL/min (by C-G formula based on Cr of 0.86).   Medical History: Past Medical History  Diagnosis Date  . GERD (gastroesophageal reflux disease)   . Asthma   . Anemia   . Arthritis   . H/O blood clots 2014  . Gallstones 2015  . Hypertension     Medications:  Prescriptions prior to admission  Medication Sig Dispense Refill Last Dose  . albuterol (PROVENTIL HFA;VENTOLIN HFA) 108 (90 BASE) MCG/ACT inhaler Inhale 2 puffs into the lungs 2 (two) times daily.    03/11/2015 at Unknown time  . budesonide (PULMICORT) 180 MCG/ACT inhaler Inhale 2 puffs into the lungs 2 (two) times daily.   03/11/2015 at Unknown time  . Calcium-Magnesium-Vitamin D (CALCIUM 1200+D3 PO) Take 1 tablet by mouth daily.   03/11/2015 at Unknown time  . cilostazol (PLETAL) 100 MG tablet Take 200 mg by mouth 2 (two) times daily.    03/11/2015 at Unknown time  . diltiazem (DILACOR XR) 180 MG 24 hr capsule Take 180 mg by mouth daily.   03/11/2015 at Unknown time  . furosemide (LASIX) 20 MG tablet Take 20 mg by mouth 2 (two) times daily.   03/11/2015 at Unknown time  . gabapentin (NEURONTIN) 300  MG capsule Take 300 mg by mouth 2 (two) times daily.   03/11/2015 at Unknown time  . Melatonin 5 MG TABS Take 5-10 mg by mouth at bedtime as needed (for sleep).   03/10/2015 at Unknown time  . montelukast (SINGULAIR) 10 MG tablet Take 10 mg by mouth daily.   03/11/2015 at Unknown time  . omeprazole (PRILOSEC) 20 MG capsule Take 20 mg by mouth at bedtime.   03/10/2015 at Unknown time  . pramipexole (MIRAPEX) 0.25 MG tablet Take 0.25 mg by mouth 2 (two) times daily.   03/11/2015 at Unknown time  . rivaroxaban (XARELTO) 20 MG TABS tablet Take 20 mg by mouth daily with supper.   03/10/2015 at 1700  . senna-docusate (SENOKOT-S) 8.6-50 MG tablet Take 1-2 tablets by mouth daily as needed for mild constipation.   Past Week at Unknown time  . simvastatin (ZOCOR) 20 MG tablet Take 20 mg by mouth daily.   03/11/2015 at Unknown time  . HYDROmorphone (DILAUDID) 2 MG tablet Take 1 tablet (2 mg total) by mouth 3 (three) times daily as needed for severe pain. (Patient not taking: Reported on 02/27/2015)  20 tablet 0 Not Taking at Unknown time  . levofloxacin (LEVAQUIN) 750 MG tablet Take 1 tablet (750 mg total) by mouth daily. (Patient not taking: Reported on 03/11/2015) 7 tablet 0     Assessment: Patient was taking Xarelto 20mg  daily PTA for PVD. Patient with decreased renal function on admission.   Plan:  Will continue Xarelto at lower dose of 15 mg daily with ARF improving. May need to increase dose in renal function continues to improve.   Pharmacy will continue to monitor and adjust per consult.   Ulice Dash, PharmD Clinical Pharmacist   03/13/2015 11:28 AM

## 2015-03-13 NOTE — Progress Notes (Signed)
  Pharmacy Consult for Electrolyte Monitoring and Replacement  Indication: hypokalemia  Allergies  Allergen Reactions  . Codeine Diarrhea  . Hydrocodone Nausea And Vomiting  . Tramadol Nausea And Vomiting    Patient Measurements: Height: 4\' 11"  (149.9 cm) Weight: 137 lb (62.143 kg) IBW/kg (Calculated) : 43.2  Vital Signs: Temp: 99.9 F (37.7 C) (03/02 0500) Temp Source: Oral (03/02 0000) BP: 144/36 mmHg (03/02 0500) Pulse Rate: 107 (03/02 0500) Intake/Output from previous day: 03/01 0701 - 03/02 0700 In: 3839.3 [P.O.:240; I.V.:1907.6; IV Piggyback:1316.7] Out: 2651 [Urine:2650; Stool:1] Intake/Output from this shift: Total I/O In: 375 [Other:375] Out: 351 [Urine:350; Stool:1]  Labs:  Recent Labs  03/11/15 1511  WBC 11.5*  HGB 7.2*  HCT 22.1*  PLT 385  APTT 38*  INR 1.65     Recent Labs  03/11/15 1511 03/12/15 0409 03/12/15 1708 03/13/15 0456  NA 127* 131* 131* 132*  K 2.4* 2.5* 2.9* 4.1  CL 84* 96* 98* 101  CO2 33* 28 26 25   GLUCOSE 136* 182* 159* 125*  BUN 35* 24* 14 11  CREATININE 1.85* 1.19* 0.96 0.86  CALCIUM 8.1* 7.6* 7.8* 7.7*  MG 1.7  --  1.5* 2.4  PHOS 2.7  --   --  1.1*  PROT 5.7* 5.5*  --   --   ALBUMIN 2.7* 2.5*  --   --   AST 19 15  --   --   ALT 10* 8*  --   --   ALKPHOS 76 72  --   --   BILITOT 0.5 0.3  --   --    Estimated Creatinine Clearance: 39.1 mL/min (by C-G formula based on Cr of 0.86).    Recent Labs  03/11/15 1818  GLUCAP 160*    Assessment: Pharmacy consulted to assist in managing electrolytes in this 82 y/o F admitted with hypotension and altered mental status.   Mg low @ 1.5 K remains low at 2.9 after receiving 6 runs of KCl 10 mEq today.  Plan:  K 4.1, Mg 2.4, phos 1.1 Ordered sodium phosphate 30 mmol IV x 1, will recheck electrolytes tomorrow with AM labs. Pharmacy will continue to monitor and adjust per consult.    Laural Benes, PharmD, BCPS Clinical Pharmacist 03/13/2015,5:51 AM

## 2015-03-13 NOTE — Plan of Care (Signed)
Problem: Safety: Goal: Ability to remain free from injury will improve Outcome: Progressing Up with assist to [facility] and chair.  Up in halls 50 feet with walker accompanied by Physical therapy.  Tolerated well with some exertional dyspnea  Problem: Pain Managment: Goal: General experience of comfort will improve Outcome: Progressing No c/o pain  Problem: Physical Regulation: Goal: Ability to maintain clinical measurements within normal limits will improve Outcome: Progressing Temp 102 at 0100.   No fevers since then.  Cultures negative.  Lactic acid normal  Problem: Tissue Perfusion: Goal: Risk factors for ineffective tissue perfusion will decrease Outcome: Progressing Extremities warm.  Lower extremity edema.  Doppler pedal pulses strong  Problem: Activity: Goal: Risk for activity intolerance will decrease Outcome: Progressing Dyspnea on exertion.  Sinus tach 100-118.  Wears O2 at bedtime at home.  Wearing 2L/[place] now  Problem: Fluid Volume: Goal: Ability to maintain a balanced intake and output will improve Outcome: Progressing CVP 7-8.  1L NS bolus given over 2 hrs today.   Levophed gtt off at 1000  Problem: Nutrition: Goal: Adequate nutrition will be maintained Outcome: Progressing Good appetite.  Problem: Bowel/Gastric: Goal: Will not experience complications related to bowel motility Outcome: Progressing Had stool softeners last night. 3 stools since.

## 2015-03-13 NOTE — H&P (Signed)
[facility]   PATIENT NAME: Natasha [name]    MR#:  [id number]  DATE OF BIRTH:  03/30/1928  DATE OF ADMISSION:  03/11/2015  PRIMARY CARE PHYSICIAN: Rusty Aus, MD   REQUESTING/REFERRING PHYSICIAN: Dr. Joni Fears  CHIEF COMPLAINT:   Found to have altered mental status and low blood pressure HISTORY OF PRESENT ILLNESS:  Alert and awake this AM, labile BP, almost off of vasopressors, CVP 7 will give salnie bolus again today  It appears patient is clinically dehydrated. No source of infection is found however she is being empirically on antibiotics     REVIEW OF SYSTEMS:  Review of Systems  Constitutional: Negative for fever, chills and diaphoresis.  HENT: Negative for hearing loss and nosebleeds.   Respiratory: Negative.  Negative for cough, hemoptysis, sputum production, shortness of breath and wheezing.   Cardiovascular: Negative for orthopnea, claudication and leg swelling.  Gastrointestinal: Negative for nausea, vomiting and abdominal pain.  Skin: Negative for rash.  Neurological: Negative for dizziness, tingling and headaches.  Endo/Heme/Allergies: Does not bruise/bleed easily.  Psychiatric/Behavioral: Negative for suicidal ideas and substance abuse. The patient is not nervous/anxious.   All other systems reviewed and are negative.    MEDICATIONS AT HOME:   Prior to Admission medications   Medication Sig Start Date End Date Taking? Authorizing Provider  albuterol (PROVENTIL HFA;VENTOLIN HFA) 108 (90 BASE) MCG/ACT inhaler Inhale 2 puffs into the lungs 2 (two) times daily.    Yes Historical Provider, MD  budesonide (PULMICORT) 180 MCG/ACT inhaler Inhale 2 puffs into the lungs 2 (two) times daily.   Yes Historical Provider, MD  Calcium-Magnesium-Vitamin D (CALCIUM 1200+D3 PO) Take 1 tablet by mouth daily.   Yes Historical Provider, MD  cilostazol (PLETAL) 100 MG tablet Take 200 mg by mouth 2 (two) times daily.    Yes Historical Provider, MD  diltiazem (DILACOR  XR) 180 MG 24 hr capsule Take 180 mg by mouth daily.   Yes Historical Provider, MD  furosemide (LASIX) 20 MG tablet Take 20 mg by mouth 2 (two) times daily.   Yes Historical Provider, MD  gabapentin (NEURONTIN) 300 MG capsule Take 300 mg by mouth 2 (two) times daily.   Yes Historical Provider, MD  Melatonin 5 MG TABS Take 5-10 mg by mouth at bedtime as needed (for sleep).   Yes Historical Provider, MD  montelukast (SINGULAIR) 10 MG tablet Take 10 mg by mouth daily.   Yes Historical Provider, MD  omeprazole (PRILOSEC) 20 MG capsule Take 20 mg by mouth at bedtime.   Yes Historical Provider, MD  pramipexole (MIRAPEX) 0.25 MG tablet Take 0.25 mg by mouth 2 (two) times daily.   Yes Historical Provider, MD  rivaroxaban (XARELTO) 20 MG TABS tablet Take 20 mg by mouth daily with supper.   Yes Historical Provider, MD  senna-docusate (SENOKOT-S) 8.6-50 MG tablet Take 1-2 tablets by mouth daily as needed for mild constipation.   Yes Historical Provider, MD  simvastatin (ZOCOR) 20 MG tablet Take 20 mg by mouth daily.   Yes Historical Provider, MD  HYDROmorphone (DILAUDID) 2 MG tablet Take 1 tablet (2 mg total) by mouth 3 (three) times daily as needed for severe pain. Patient not taking: Reported on 02/27/2015 08/17/14 08/21/15  Daymon Larsen, MD  levofloxacin (LEVAQUIN) 750 MG tablet Take 1 tablet (750 mg total) by mouth daily. Patient not taking: Reported on 03/11/2015 03/03/15   Janalyn Harder Stegmayer, PA-C      VITAL SIGNS:  Blood pressure 140/58, pulse 108, temperature 99.3  F (37.4 C), temperature source Oral, resp. rate 17, height 4\' 11"  (1.499 m), weight 137 lb (62.143 kg), SpO2 97 %.  PHYSICAL EXAMINATION:  GENERAL:  82 y.o.-year-old patient lying in the bed with no acute distress. Appears chronically ill EYES: Pupils equal, round, reactive to light and accommodation. No scleral icterus. Extraocular muscles intact.  HEENT: Head atraumatic, normocephalic. Oropharynx and nasopharynx clear.  NECK:   Supple, no jugular venous distention. No thyroid enlargement, no tenderness.  LUNGS: Normal breath sounds bilaterally, no wheezing, rales,rhonchi or crepitation. No use of accessory muscles of respiration.  CARDIOVASCULAR: S1, S2 normal. No murmurs, rubs, or gallops.  ABDOMEN: Soft, nontender, nondistended. Bowel sounds present. No organomegaly or mass.  EXTREMITIES: 2+ pedal edema, no cyanosis, or clubbing. Spider veins present NEUROLOGIC: Cranial nerves II through XII are intact. Muscle strength 4 /5 in all extremities. Sensation intact. Gait not checked.  subjective weakness PSYCHIATRIC: The patient is alert and oriented x 3.  SKIN: No obvious rash, lesion, or ulcer.   LABORATORY PANEL:   CBC  Recent Labs Lab 03/11/15 1511  WBC 11.5*  HGB 7.2*  HCT 22.1*  PLT 385   ------------------------------------------------------------------------------------------------------------------  Chemistries   Recent Labs Lab 03/12/15 0409  03/13/15 0456  NA 131*  < > 132*  K 2.5*  < > 4.1  CL 96*  < > 101  CO2 28  < > 25  GLUCOSE 182*  < > 125*  BUN 24*  < > 11  CREATININE 1.19*  < > 0.86  CALCIUM 7.6*  < > 7.7*  MG  --   < > 2.4  AST 15  --   --   ALT 8*  --   --   ALKPHOS 72  --   --   BILITOT 0.3  --   --   < > = values in this interval not displayed. ------------------------------------------------------------------------------------------------------------------  Cardiac Enzymes  Recent Labs Lab 03/11/15 1511  TROPONINI <0.03   ------------------------------------------------------------------------------------------------------------------  RADIOLOGY:  Ct Abdomen Pelvis Wo Contrast  03/11/2015  CLINICAL DATA:  Code Sepsis. Pt brought to ED via [facility] who picked her up at her residence at [facility]. Staff and husband state that she has had altered mental status since about 12:45 today. Pt has recently been treated with levoquin, likely for a respiratory  infection, as patient reports frequent coughing. EXAM: CT CHEST, ABDOMEN AND PELVIS WITHOUT CONTRAST TECHNIQUE: Multidetector CT imaging of the chest, abdomen and pelvis was performed following the standard protocol without IV contrast. COMPARISON:  Abdomen CT, 06/07/2014. Multiple prior chest radiographs. FINDINGS: CT CHEST Neck base and axilla:  No mass or adenopathy. Mediastinum and hila: Large hernia with the stomach protruding to the right of midline into the right posterior in the thorax. Stomach is mildly dilated above this from the level of the Carina to the neck base. Heart is normal in size and configuration. There are mild coronary artery calcifications. Great vessels are normal in caliber. No mediastinal or hilar masses or pathologically enlarged lymph nodes. Lungs and pleura: Minimal pleural effusions. There is opacity in the dependent lower lobes and in the right lower lobe adjacent to the herniated stomach, most consistent atelectasis although pneumonia is possible. There is diffuse interstitial thickening. Calcified granuloma is noted in the right middle lobe. There is no convincing pneumonia. No pneumothorax. CT ABDOMEN AND PELVIS Hepatobiliary: Normal liver. Gallbladder surgically absent. Bile duct normal in caliber for age. Spleen, pancreas, adrenal glands:  Unremarkable. Kidneys, ureters, bladder: 18  mm posterior midpole right renal mass consistent with a cyst. No other renal masses, no stones and no hydronephrosis. Ureters normal in course and in caliber. Normal bladder. Uterus and adnexa: Uterus surgically absent. No adnexal/ pelvic masses. Vascular: There is atherosclerotic calcification along the abdominal aorta and iliac vessels. No aneurysm. There has been previous vascular surgery with bilateral inguinal scarring and surgical clips. There is a left superficial femoral artery graft. Lymph nodes:  No adenopathy. Ascites:  None. Gastrointestinal: No evidence of bowel obstruction. No bowel  dilation. No wall thickening. No mesenteric inflammation. Appendix surgically absent. MUSCULOSKELETAL No osteoblastic or osteolytic lesions. Bones are demineralized. Degenerative changes are noted throughout the visualized spine. IMPRESSION: 1. Minimal pleural effusions and bilateral interstitial thickening. Mild congestive heart failure suspected. 2. Additional opacity in the lower lobes most likely atelectasis. Pneumonia is not excluded but felt less likely. 3. No acute findings in the abdomen pelvis. No evidence of a source of infection. Electronically Signed   By: Lajean Manes M.D.   On: 03/11/2015 17:44   Ct Chest Wo Contrast  03/11/2015  CLINICAL DATA:  Code Sepsis. Pt brought to ED via [facility] who picked her up at her residence at [facility]. Staff and husband state that she has had altered mental status since about 12:45 today. Pt has recently been treated with levoquin, likely for a respiratory infection, as patient reports frequent coughing. EXAM: CT CHEST, ABDOMEN AND PELVIS WITHOUT CONTRAST TECHNIQUE: Multidetector CT imaging of the chest, abdomen and pelvis was performed following the standard protocol without IV contrast. COMPARISON:  Abdomen CT, 06/07/2014. Multiple prior chest radiographs. FINDINGS: CT CHEST Neck base and axilla:  No mass or adenopathy. Mediastinum and hila: Large hernia with the stomach protruding to the right of midline into the right posterior in the thorax. Stomach is mildly dilated above this from the level of the Carina to the neck base. Heart is normal in size and configuration. There are mild coronary artery calcifications. Great vessels are normal in caliber. No mediastinal or hilar masses or pathologically enlarged lymph nodes. Lungs and pleura: Minimal pleural effusions. There is opacity in the dependent lower lobes and in the right lower lobe adjacent to the herniated stomach, most consistent atelectasis although pneumonia is possible. There is diffuse  interstitial thickening. Calcified granuloma is noted in the right middle lobe. There is no convincing pneumonia. No pneumothorax. CT ABDOMEN AND PELVIS Hepatobiliary: Normal liver. Gallbladder surgically absent. Bile duct normal in caliber for age. Spleen, pancreas, adrenal glands:  Unremarkable. Kidneys, ureters, bladder: 18 mm posterior midpole right renal mass consistent with a cyst. No other renal masses, no stones and no hydronephrosis. Ureters normal in course and in caliber. Normal bladder. Uterus and adnexa: Uterus surgically absent. No adnexal/ pelvic masses. Vascular: There is atherosclerotic calcification along the abdominal aorta and iliac vessels. No aneurysm. There has been previous vascular surgery with bilateral inguinal scarring and surgical clips. There is a left superficial femoral artery graft. Lymph nodes:  No adenopathy. Ascites:  None. Gastrointestinal: No evidence of bowel obstruction. No bowel dilation. No wall thickening. No mesenteric inflammation. Appendix surgically absent. MUSCULOSKELETAL No osteoblastic or osteolytic lesions. Bones are demineralized. Degenerative changes are noted throughout the visualized spine. IMPRESSION: 1. Minimal pleural effusions and bilateral interstitial thickening. Mild congestive heart failure suspected. 2. Additional opacity in the lower lobes most likely atelectasis. Pneumonia is not excluded but felt less likely. 3. No acute findings in the abdomen pelvis. No evidence of a source of infection.  Electronically Signed   By: Lajean Manes M.D.   On: 03/11/2015 17:44   Dg Chest Portable 1 View  03/11/2015  CLINICAL DATA:  Status post central line placement EXAM: PORTABLE CHEST 1 VIEW COMPARISON:  03/11/2015 FINDINGS: Cardiac shadow remains enlarged. A right jugular central line is noted with the catheter tip deep in the right atrium. This should be withdrawn approximately 5 cm. No pneumothorax is noted. Changes of large diaphragmatic hernia are again  identified and stable. Some left basilar atelectasis and effusion is noted. IMPRESSION: Status post central line placement without pneumothorax. The line is deep in the right atrium and should be withdrawn 5 cm. Electronically Signed   By: Inez Catalina M.D.   On: 03/11/2015 16:55   Dg Chest Port 1 View  03/11/2015  CLINICAL DATA:  Altered mental status EXAM: PORTABLE CHEST 1 VIEW COMPARISON:  2/20/ 17 FINDINGS: Cardiomediastinal silhouette is stable. Again noted large right diaphragmatic/hiatal hernia. Stable mild elevation of the left hemidiaphragm. Left basilar atelectasis or scarring. No superimposed infiltrate or pulmonary edema. IMPRESSION: Again noted large right diaphragmatic/hiatal hernia. Stable mild elevation of the left hemidiaphragm. Left basilar atelectasis or scarring. No superimposed infiltrate or pulmonary edema. Electronically Signed   By: Lahoma Crocker M.D.   On: 03/11/2015 15:46    IMPRESSION AND PLAN:  82 y.o. female with a known history of severe peripheral vascular disease on Xarelto with recent angiogram of the lower extremity,, chronic lower extremity ulceration of the left digits, chronic anemia comes to the emergency room after she was found to be hypotensive with blood pressure 60/30 low-grade fever and altered mental status likely from hypovolumic shock  1. Low-grade fever, hypotension, altered mental status post transient suspect due to dehydration and hypovolemic shock with acute renal failure due to poor by mouth intake -IV fluids for hydration -Continue IV levophed MAP>greater than 65 -Lactic acid normal -CVP is 7=will give extra IVF bolus  2. Acute renal failure likely ATN with poor by mouth intake -Monitor I's and O's monitor creatinine closely, avoid nephrotoxins -Baseline creatinine 0.8. Creatinine 1.85 -->0.86  3. Hypotension hold her blood pressure meds   I have personally obtained a history, examined the patient, evaluated Pertinent laboratory and  RadioGraphic/imaging results, and  formulated the assessment and plan   The Patient requires high complexity decision making for assessment and support, frequent evaluation and titration of therapies. Patient  satisfied with Plan of action and management. All questions answered  Corrin Parker, M.D.  [facility]  Medical Director [place] Director [facility] Cardio-Pulmonary Department

## 2015-03-13 NOTE — Progress Notes (Signed)
[facility] [place] NAME: Natasha [name]    MR#:  [id number]  DATE OF BIRTH:  06/09/1928  SUBJECTIVE:  CHIEF COMPLAINT:   Chief Complaint  Patient presents with  . Altered Mental Status    Completely alert and oriented now, Off levophed drip, BP is stable now, had one loos BM today.   She had fever up to 102 F overnight.  REVIEW OF SYSTEMS:  CONSTITUTIONAL: positive fever, fatigue or weakness.  EYES: No blurred or double vision.  EARS, NOSE, AND THROAT: No tinnitus or ear pain.  RESPIRATORY: No cough, shortness of breath, wheezing or hemoptysis.  CARDIOVASCULAR: No chest pain, orthopnea, edema.  GASTROINTESTINAL: No nausea, vomiting, diarrhea or abdominal pain.  GENITOURINARY: No dysuria, hematuria.  ENDOCRINE: No polyuria, nocturia,  HEMATOLOGY: No anemia, easy bruising or bleeding SKIN: No rash or lesion. MUSCULOSKELETAL: No joint pain or arthritis.   NEUROLOGIC: No tingling, numbness, weakness.  PSYCHIATRY: No anxiety or depression.   ROS  DRUG ALLERGIES:   Allergies  Allergen Reactions  . Codeine Diarrhea  . Hydrocodone Nausea And Vomiting  . Tramadol Nausea And Vomiting    VITALS:  Blood pressure 142/48, pulse 115, temperature 99.1 F (37.3 C), temperature source Oral, resp. rate 18, height 4\' 11"  (1.499 m), weight 62.143 kg (137 lb), SpO2 96 %.  PHYSICAL EXAMINATION:   GENERAL: 82 y.o.-year-old patient lying in the bed with no acute distress.  EYES: Pupils equal, round, reactive to light and accommodation. No scleral icterus. Extraocular muscles intact.  HEENT: Head atraumatic, normocephalic. Oropharynx and nasopharynx clear. Left side cochlear implant on her temporal area present. NO tenderness on both mastoid process area on pressure. NECK: Supple, no jugular venous distention. No thyroid enlargement, no tenderness.  LUNGS: Normal breath sounds bilaterally, no wheezing, rales,rhonchi or crepitation.  No use of accessory muscles of respiration.  CARDIOVASCULAR: S1, S2 normal. No murmurs, rubs, or gallops.  ABDOMEN: Soft, nontender, nondistended. Bowel sounds present. No organomegaly or mass.  EXTREMITIES: 2+ pedal edema, no cyanosis, or clubbing. Spider veins present, distal pulses palpable. NEUROLOGIC: Cranial nerves II through XII are intact. Muscle strength 4 /5 in all extremities. Sensation intact. Gait not checked. subjective weakness PSYCHIATRIC: The patient is alert and oriented x 3.  SKIN: No obvious rash, lesion, or ulcer.   Physical Exam LABORATORY PANEL:   CBC  Recent Labs Lab 03/11/15 1511  WBC 11.5*  HGB 7.2*  HCT 22.1*  PLT 385   ------------------------------------------------------------------------------------------------------------------  Chemistries   Recent Labs Lab 03/12/15 0409  03/13/15 0456  NA 131*  < > 132*  K 2.5*  < > 4.1  CL 96*  < > 101  CO2 28  < > 25  GLUCOSE 182*  < > 125*  BUN 24*  < > 11  CREATININE 1.19*  < > 0.86  CALCIUM 7.6*  < > 7.7*  MG  --   < > 2.4  AST 15  --   --   ALT 8*  --   --   ALKPHOS 72  --   --   BILITOT 0.3  --   --   < > = values in this interval not displayed. ------------------------------------------------------------------------------------------------------------------  Cardiac Enzymes  Recent Labs Lab 03/11/15 1511  TROPONINI <0.03   ------------------------------------------------------------------------------------------------------------------  RADIOLOGY:  Ct Abdomen Pelvis Wo Contrast  03/11/2015  CLINICAL DATA:  Code Sepsis. Pt brought to ED via [facility] who picked her up at her residence at [facility]  [facility]. Staff and husband state that she has had altered mental status since about 12:45 today. Pt has recently been treated with levoquin, likely for a respiratory infection, as patient reports frequent coughing. EXAM: CT CHEST, ABDOMEN AND PELVIS WITHOUT CONTRAST TECHNIQUE:  Multidetector CT imaging of the chest, abdomen and pelvis was performed following the standard protocol without IV contrast. COMPARISON:  Abdomen CT, 06/07/2014. Multiple prior chest radiographs. FINDINGS: CT CHEST Neck base and axilla:  No mass or adenopathy. Mediastinum and hila: Large hernia with the stomach protruding to the right of midline into the right posterior in the thorax. Stomach is mildly dilated above this from the level of the Carina to the neck base. Heart is normal in size and configuration. There are mild coronary artery calcifications. Great vessels are normal in caliber. No mediastinal or hilar masses or pathologically enlarged lymph nodes. Lungs and pleura: Minimal pleural effusions. There is opacity in the dependent lower lobes and in the right lower lobe adjacent to the herniated stomach, most consistent atelectasis although pneumonia is possible. There is diffuse interstitial thickening. Calcified granuloma is noted in the right middle lobe. There is no convincing pneumonia. No pneumothorax. CT ABDOMEN AND PELVIS Hepatobiliary: Normal liver. Gallbladder surgically absent. Bile duct normal in caliber for age. Spleen, pancreas, adrenal glands:  Unremarkable. Kidneys, ureters, bladder: 18 mm posterior midpole right renal mass consistent with a cyst. No other renal masses, no stones and no hydronephrosis. Ureters normal in course and in caliber. Normal bladder. Uterus and adnexa: Uterus surgically absent. No adnexal/ pelvic masses. Vascular: There is atherosclerotic calcification along the abdominal aorta and iliac vessels. No aneurysm. There has been previous vascular surgery with bilateral inguinal scarring and surgical clips. There is a left superficial femoral artery graft. Lymph nodes:  No adenopathy. Ascites:  None. Gastrointestinal: No evidence of bowel obstruction. No bowel dilation. No wall thickening. No mesenteric inflammation. Appendix surgically absent. MUSCULOSKELETAL No  osteoblastic or osteolytic lesions. Bones are demineralized. Degenerative changes are noted throughout the visualized spine. IMPRESSION: 1. Minimal pleural effusions and bilateral interstitial thickening. Mild congestive heart failure suspected. 2. Additional opacity in the lower lobes most likely atelectasis. Pneumonia is not excluded but felt less likely. 3. No acute findings in the abdomen pelvis. No evidence of a source of infection. Electronically Signed   By: Lajean Manes M.D.   On: 03/11/2015 17:44   Ct Chest Wo Contrast  03/11/2015  CLINICAL DATA:  Code Sepsis. Pt brought to ED via [facility] who picked her up at her residence at [facility]. Staff and husband state that she has had altered mental status since about 12:45 today. Pt has recently been treated with levoquin, likely for a respiratory infection, as patient reports frequent coughing. EXAM: CT CHEST, ABDOMEN AND PELVIS WITHOUT CONTRAST TECHNIQUE: Multidetector CT imaging of the chest, abdomen and pelvis was performed following the standard protocol without IV contrast. COMPARISON:  Abdomen CT, 06/07/2014. Multiple prior chest radiographs. FINDINGS: CT CHEST Neck base and axilla:  No mass or adenopathy. Mediastinum and hila: Large hernia with the stomach protruding to the right of midline into the right posterior in the thorax. Stomach is mildly dilated above this from the level of the Carina to the neck base. Heart is normal in size and configuration. There are mild coronary artery calcifications. Great vessels are normal in caliber. No mediastinal or hilar masses or pathologically enlarged lymph nodes. Lungs and pleura: Minimal pleural effusions. There is opacity in the dependent lower lobes and  in the right lower lobe adjacent to the herniated stomach, most consistent atelectasis although pneumonia is possible. There is diffuse interstitial thickening. Calcified granuloma is noted in the right middle lobe. There is no convincing  pneumonia. No pneumothorax. CT ABDOMEN AND PELVIS Hepatobiliary: Normal liver. Gallbladder surgically absent. Bile duct normal in caliber for age. Spleen, pancreas, adrenal glands:  Unremarkable. Kidneys, ureters, bladder: 18 mm posterior midpole right renal mass consistent with a cyst. No other renal masses, no stones and no hydronephrosis. Ureters normal in course and in caliber. Normal bladder. Uterus and adnexa: Uterus surgically absent. No adnexal/ pelvic masses. Vascular: There is atherosclerotic calcification along the abdominal aorta and iliac vessels. No aneurysm. There has been previous vascular surgery with bilateral inguinal scarring and surgical clips. There is a left superficial femoral artery graft. Lymph nodes:  No adenopathy. Ascites:  None. Gastrointestinal: No evidence of bowel obstruction. No bowel dilation. No wall thickening. No mesenteric inflammation. Appendix surgically absent. MUSCULOSKELETAL No osteoblastic or osteolytic lesions. Bones are demineralized. Degenerative changes are noted throughout the visualized spine. IMPRESSION: 1. Minimal pleural effusions and bilateral interstitial thickening. Mild congestive heart failure suspected. 2. Additional opacity in the lower lobes most likely atelectasis. Pneumonia is not excluded but felt less likely. 3. No acute findings in the abdomen pelvis. No evidence of a source of infection. Electronically Signed   By: Lajean Manes M.D.   On: 03/11/2015 17:44   Dg Chest Portable 1 View  03/11/2015  CLINICAL DATA:  Status post central line placement EXAM: PORTABLE CHEST 1 VIEW COMPARISON:  03/11/2015 FINDINGS: Cardiac shadow remains enlarged. A right jugular central line is noted with the catheter tip deep in the right atrium. This should be withdrawn approximately 5 cm. No pneumothorax is noted. Changes of large diaphragmatic hernia are again identified and stable. Some left basilar atelectasis and effusion is noted. IMPRESSION: Status post central  line placement without pneumothorax. The line is deep in the right atrium and should be withdrawn 5 cm. Electronically Signed   By: Inez Catalina M.D.   On: 03/11/2015 16:55   Dg Chest Port 1 View  03/11/2015  CLINICAL DATA:  Altered mental status EXAM: PORTABLE CHEST 1 VIEW COMPARISON:  2/20/ 17 FINDINGS: Cardiomediastinal silhouette is stable. Again noted large right diaphragmatic/hiatal hernia. Stable mild elevation of the left hemidiaphragm. Left basilar atelectasis or scarring. No superimposed infiltrate or pulmonary edema. IMPRESSION: Again noted large right diaphragmatic/hiatal hernia. Stable mild elevation of the left hemidiaphragm. Left basilar atelectasis or scarring. No superimposed infiltrate or pulmonary edema. Electronically Signed   By: Lahoma Crocker M.D.   On: 03/11/2015 15:46    ASSESSMENT AND PLAN:   Active Problems:   Hypotension  1. Low-grade fever, hypotension, altered mental status post transient suspect due to dehydration and hypovolemic shock with acute renal failure due to poor by mouth intake -IV fluids for hydration -Continued IV levophed and map remains greater than 65- now stopped and BP stable. -Patient was on empiric antibiotic with IV vancomycin and cefepime. So far no source of infection identified. Blood cultures remain negative - so discontinuing antibiotics -Lactic acid normal - Keep watching fro any signs of infection, and will resume Abx, as per Critical care specialist  2. Acute renal failure likely ATN with poor by mouth intake -Patient also recently had angiogram for her for vascular disease this was done on February 20 by Dr. Lucky Cowboy -Monitor I's and O's monitor creatinine closely, avoid nephrotoxins -Baseline creatinine 0.8. Creatinine on admission 1.85- now  came down to 0.86.  3. Hypotension hold her blood pressure meds    On levopehd drip now.    Off now, and BP stable.  4. GERD continue PPI  5. DVT prophylaxis patient on Xarelto  6. Chronic  anticoagulation for severe peripheral vascular disease -Since creatinine clearance is low will hold off Xarelto.  - now with normal renal func- resume.   All the records are reviewed and case discussed with Care Management/Social Workerr. Management plans discussed with the patient, family and they are in agreement.  CODE STATUS: full  TOTAL TIME TAKING CARE OF THIS PATIENT: 35 minutes.   POSSIBLE D/C IN 2-3 DAYS, DEPENDING ON CLINICAL CONDITION.   Vaughan Basta M.D on 03/13/2015   Between 7am to 6pm - Pager - [phone]  After 6pm go to [email or web address] - password [email or web address] [facility]  Office  [phone]  CC: Primary care physician; Rusty Aus, MD  Note: This dictation was prepared with Dragon dictation along with smaller phrase technology. Any transcriptional errors that result from this process are unintentional.

## 2015-03-13 NOTE — Progress Notes (Signed)
Pt remains alert and oriented with O2 sats upper 90's on 4L O2 via nasal canula; Sinus Tach on cardiac monitor; no c/o pain; administered tylenol for elevated temps temp improved; adequate uop via foley; will continue to monitor and assess pt

## 2015-03-13 NOTE — Evaluation (Signed)
Physical Therapy Evaluation Patient Details Name: Natasha [name] MRN: [id number] DOB: 1928/04/14 Today's Date: 03/13/2015   History of Present Illness  presented to ER seconadryt o hypotension, AMS; admitted with dehydration and hypovolemic shock (requiring use of vasopressors, now discontinued). Of note patient with recent DC 2/20 after angiogram to L LE to repair occlusion of bypass; has been at healthcare center at [facility] since discharge.  Clinical Impression  Upon evaluation, patient alert and oriented to all information; very eager for mobility as tolerated.  Bilat UE/LE strength and ROM grossly WFL and symmetrical; mild pain in L LE with resistance testing.  Able to complete sit/stand, basic transfers and gait (150') with RW, cga (second person for management of lines).  Fairly stable without overt buckling or LOB; maintains vitals stable and WFL on 2L supplemental O2. Would benefit from skilled PT to address above deficits and promote optimal return to PLOF; Recommend transition to [place] upon discharge from acute hospitalization.     Follow Up Recommendations Home health PT (patient very eager/insistent on return home with HHPT)    Equipment Recommendations   (anticipate potential need for home O2?)    Recommendations for Other Services       Precautions / Restrictions Precautions Precautions: Fall Restrictions Weight Bearing Restrictions: No      Mobility  Bed Mobility               General bed mobility comments: seated in recliner beginning/end of session  Transfers Overall transfer level: Needs assistance Equipment used: Rolling walker (2 wheeled) Transfers: Sit to/from Stand Sit to Stand: Supervision;Min guard            Ambulation/Gait Ambulation/Gait assistance: Min guard Ambulation Distance (Feet): 40 Feet Assistive device: Rolling walker (2 wheeled)     Gait velocity interpretation: <1.8 ft/sec, indicative of risk for recurrent  falls General Gait Details: broad BOS with decreased step height/length (R > L); step to gait pattern.  Slow, but steady, cadence and gait speed. No buckling, LOB or overt safety concern.  Mild cuing for walker position.  Patient very motivated and eager for ambulation.  Stairs            Wheelchair Mobility    Modified Rankin (Stroke Patients Only)       Balance Overall balance assessment: Needs assistance Sitting-balance support: No upper extremity supported;Feet supported Sitting balance-Leahy Scale: Good     Standing balance support: Bilateral upper extremity supported Standing balance-Leahy Scale: Fair                               Pertinent Vitals/Pain Pain Assessment: Faces Faces Pain Scale: Hurts a little bit Pain Location: L LE Pain Descriptors / Indicators: Sore Pain Intervention(s): Limited activity within patient's tolerance;Monitored during session;Repositioned    Home Living Family/patient expects to be discharged to::  [facility]) Living Arrangements: Spouse/significant other               Additional Comments: Single-story home with level entrance    Prior Function Level of Independence: Independent with assistive device(s)         Comments: Pt reports that she is able to be relatively active and does chores, errands, etc     Hand Dominance        Extremity/Trunk Assessment   Upper Extremity Assessment: Overall WFL for tasks assessed           Lower Extremity Assessment:  (  grossly 3+/5 throughout bilat LEs; L LE with mild pitting edema)         Communication   Communication: HOH (L cochlear implant)  Cognition Arousal/Alertness: Awake/alert Behavior During Therapy: WFL for tasks assessed/performed Overall Cognitive Status: Within Functional Limits for tasks assessed                      General Comments      Exercises Other Exercises Other Exercises: Additional gait distance,  110' with RW--deviations as above.  Patient with fair ability to pace self and gauge activity level as needed.  Maintains sats WFL on 2-3L supplemental O2 throughout gait trial. Other Exercises: Issued handout with seated and supine therex for use as HEP outside of therapy; encouraged performance 1-2x/day as appropriate.  Patient very eager/willing; voiced understanding.      Assessment/Plan    PT Assessment Patient needs continued PT services  PT Diagnosis Difficulty walking;Generalized weakness   PT Problem List Decreased strength;Decreased range of motion;Decreased activity tolerance;Decreased balance;Decreased mobility;Decreased safety awareness;Cardiopulmonary status limiting activity  PT Treatment Interventions DME instruction;Gait training;Functional mobility training;Therapeutic activities;Therapeutic exercise;Balance training;Neuromuscular re-education;Patient/family education   PT Goals (Current goals can be found in the Care Plan section) Acute Rehab PT Goals Patient Stated Goal: I want to go home tomorrow PT Goal Formulation: With patient Time For Goal Achievement: 03/27/15 Potential to Achieve Goals: Good    Frequency Min 2X/week   Barriers to discharge        Co-evaluation               End of Session Equipment Utilized During Treatment: Gait belt;Oxygen Activity Tolerance: Patient tolerated treatment well Patient left: in chair;with call bell/phone within reach;with nursing/sitter in room Nurse Communication: Mobility status         Time: [id number] PT Time Calculation (min) (ACUTE ONLY): 25 min   Charges:   PT Evaluation $PT Eval Moderate Complexity: 1 Procedure PT Treatments $Gait Training: 8-22 mins   PT G Codes:        Elesa Garman H. Owens Shark, PT, DPT, NCS 03/13/2015, 4:28 PM [phone]

## 2015-03-14 ENCOUNTER — Inpatient Hospital Stay: Payer: Medicare Other

## 2015-03-14 LAB — CBC
HCT: 23.2 % — ABNORMAL LOW (ref 35.0–47.0)
HEMOGLOBIN: 7.8 g/dL — AB (ref 12.0–16.0)
MCH: 26.7 pg (ref 26.0–34.0)
MCHC: 33.7 g/dL (ref 32.0–36.0)
MCV: 79.2 fL — ABNORMAL LOW (ref 80.0–100.0)
Platelets: 343 10*3/uL (ref 150–440)
RBC: 2.93 MIL/uL — AB (ref 3.80–5.20)
RDW: 18.6 % — ABNORMAL HIGH (ref 11.5–14.5)
WBC: 9 10*3/uL (ref 3.6–11.0)

## 2015-03-14 LAB — BASIC METABOLIC PANEL
Anion gap: 6 (ref 5–15)
BUN: 9 mg/dL (ref 6–20)
CHLORIDE: 107 mmol/L (ref 101–111)
CO2: 25 mmol/L (ref 22–32)
Calcium: 8.1 mg/dL — ABNORMAL LOW (ref 8.9–10.3)
Creatinine, Ser: 0.5 mg/dL (ref 0.44–1.00)
GFR calc Af Amer: >60 mL/min (ref >60)
GFR calc non Af Amer: >60 mL/min (ref >60)
Glucose, Bld: 93 mg/dL (ref 65–99)
POTASSIUM: 3.6 mmol/L (ref 3.5–5.1)
SODIUM: 138 mmol/L (ref 135–145)

## 2015-03-14 LAB — PHOSPHORUS: Phosphorus: 1.7 mg/dL — ABNORMAL LOW (ref 2.5–4.6)

## 2015-03-14 LAB — MAGNESIUM: MAGNESIUM: 1.8 mg/dL (ref 1.7–2.4)

## 2015-03-14 MED ORDER — ZOLPIDEM TARTRATE 5 MG PO TABS
5.0000 mg | ORAL_TABLET | Freq: Every evening | ORAL | Status: DC | PRN
  Administered 2015-03-14 – 2015-03-15 (×2): 5 mg via ORAL
  Filled 2015-03-14 (×2): qty 1

## 2015-03-14 MED ORDER — RIVAROXABAN 20 MG PO TABS
20.0000 mg | ORAL_TABLET | Freq: Every day | ORAL | Status: DC
  Administered 2015-03-14 – 2015-03-15 (×2): 20 mg via ORAL
  Filled 2015-03-14 (×2): qty 1

## 2015-03-14 MED ORDER — ATORVASTATIN CALCIUM 10 MG PO TABS
10.0000 mg | ORAL_TABLET | Freq: Every day | ORAL | Status: DC
  Administered 2015-03-14 – 2015-03-15 (×2): 10 mg via ORAL
  Filled 2015-03-14 (×2): qty 1

## 2015-03-14 MED ORDER — DILTIAZEM HCL ER COATED BEADS 180 MG PO CP24
180.0000 mg | ORAL_CAPSULE | Freq: Every day | ORAL | Status: DC
  Administered 2015-03-14 – 2015-03-16 (×3): 180 mg via ORAL
  Filled 2015-03-14 (×3): qty 1

## 2015-03-14 MED ORDER — SODIUM PHOSPHATE 3 MMOLE/ML IV SOLN
12.0000 mmol | Freq: Once | INTRAVENOUS | Status: AC
  Administered 2015-03-14: 12 mmol via INTRAVENOUS
  Filled 2015-03-14: qty 4

## 2015-03-14 NOTE — Progress Notes (Signed)
ANTICOAGULATION CONSULT NOTE - Follow Up Consult  Pharmacy Consult for Xarelto Indication: PVD  Allergies  Allergen Reactions  . Codeine Diarrhea  . Hydrocodone Nausea And Vomiting  . Tramadol Nausea And Vomiting    Patient Measurements: Height: 4\' 11"  (149.9 cm) Weight: 137 lb (62.143 kg) IBW/kg (Calculated) : 43.2 Heparin Dosing Weight: na  Vital Signs: Temp: 98.5 F (36.9 C) (03/03 0803) Temp Source: Oral (03/03 0803) BP: 165/61 mmHg (03/03 0803) Pulse Rate: 121 (03/03 0803)  Labs:  Recent Labs  03/11/15 1511  03/12/15 1708 03/13/15 0456 03/14/15 0615  HGB 7.2*  --   --   --  7.8*  HCT 22.1*  --   --   --  23.2*  PLT 385  --   --   --  343  APTT 38*  --   --   --   --   LABPROT 19.5*  --   --   --   --   INR 1.65  --   --   --   --   CREATININE 1.85*  < > 0.96 0.86 0.50  TROPONINI <0.03  --   --   --   --   < > = values in this interval not displayed.  Estimated Creatinine Clearance: 42 mL/min (by C-G formula based on Cr of 0.5).   Medical History: Past Medical History  Diagnosis Date  . GERD (gastroesophageal reflux disease)   . Asthma   . Anemia   . Arthritis   . H/O blood clots 2014  . Gallstones 2015  . Hypertension     Medications:  Prescriptions prior to admission  Medication Sig Dispense Refill Last Dose  . albuterol (PROVENTIL HFA;VENTOLIN HFA) 108 (90 BASE) MCG/ACT inhaler Inhale 2 puffs into the lungs 2 (two) times daily.    03/11/2015 at Unknown time  . budesonide (PULMICORT) 180 MCG/ACT inhaler Inhale 2 puffs into the lungs 2 (two) times daily.   03/11/2015 at Unknown time  . Calcium-Magnesium-Vitamin D (CALCIUM 1200+D3 PO) Take 1 tablet by mouth daily.   03/11/2015 at Unknown time  . cilostazol (PLETAL) 100 MG tablet Take 200 mg by mouth 2 (two) times daily.    03/11/2015 at Unknown time  . diltiazem (DILACOR XR) 180 MG 24 hr capsule Take 180 mg by mouth daily.   03/11/2015 at Unknown time  . furosemide (LASIX) 20 MG tablet Take 20 mg by  mouth 2 (two) times daily.   03/11/2015 at Unknown time  . gabapentin (NEURONTIN) 300 MG capsule Take 300 mg by mouth 2 (two) times daily.   03/11/2015 at Unknown time  . Melatonin 5 MG TABS Take 5-10 mg by mouth at bedtime as needed (for sleep).   03/10/2015 at Unknown time  . montelukast (SINGULAIR) 10 MG tablet Take 10 mg by mouth daily.   03/11/2015 at Unknown time  . omeprazole (PRILOSEC) 20 MG capsule Take 20 mg by mouth at bedtime.   03/10/2015 at Unknown time  . pramipexole (MIRAPEX) 0.25 MG tablet Take 0.25 mg by mouth 2 (two) times daily.   03/11/2015 at Unknown time  . rivaroxaban (XARELTO) 20 MG TABS tablet Take 20 mg by mouth daily with supper.   03/10/2015 at 1700  . senna-docusate (SENOKOT-S) 8.6-50 MG tablet Take 1-2 tablets by mouth daily as needed for mild constipation.   Past Week at Unknown time  . simvastatin (ZOCOR) 20 MG tablet Take 20 mg by mouth daily.   03/11/2015 at Unknown time  .  HYDROmorphone (DILAUDID) 2 MG tablet Take 1 tablet (2 mg total) by mouth 3 (three) times daily as needed for severe pain. (Patient not taking: Reported on 02/27/2015) 20 tablet 0 Not Taking at Unknown time  . levofloxacin (LEVAQUIN) 750 MG tablet Take 1 tablet (750 mg total) by mouth daily. (Patient not taking: Reported on 03/11/2015) 7 tablet 0     Assessment: Patient was taking Xarelto 20mg  daily PTA for PVD. Patient with decreased renal function on admission and Xarelto was renally adjusted to 15 mg po qpm.   SCr: 0.5 (0.8), est CrCl based on TBW: 51 mL/min  Plan:  Patient's renal function continues to improve.  Will transition patient back to home dose of Xarelto 20 mg po q5pm as CrCl is now >50 mL/min.  Pharmacy will continue to monitor and adjust per consult.   Murrell Converse, PharmD Clinical Pharmacist 03/14/2015

## 2015-03-14 NOTE — Clinical Documentation Improvement (Signed)
Internal Medicine  Can the diagnosis of altered mental status be further specified in progress notes and discharge summary?   Acute metabolic encephalopathy  Other  Clinically Undetermined  Document any associated diagnoses/conditions.   Supporting Information: 82 year old patient Chief complaint: altered mental status and low blood pressure  2/28 H&P comes to the emergency room after she was found to be hypotensive with blood pressure 60/30 low-grade fever and altered mental status. Neurological: Negative for tingling, sensory change, speech change, focal weakness, seizures and headaches.   Transient altered mental status  PSYCHIATRIC: The patient is alert and oriented x 2.  1. Low-grade fever, hypotension, altered mental status post transient suspect due to dehydration and hypovolemic shock with acute renal failure due to poor by mouth intake -Admit to ICU -IV fluids for hydration -Continue IV levothyroid and mean office map remains greater than 65 -Patient currently on empiric antibiotic with IV vancomycin and cefepime. So far no source of infection identified. Blood cultures remain negative and no spiking fever consider discontinuing antibiotics -Lactic acid normal  3/1 Pulmonary H&P hypotensive with blood pressure 60/30 low-grade fever and altered mental status likely from hypovolumic shock  1. Low-grade fever, hypotension, altered mental status post transient suspect due to dehydration and hypovolemic shock with acute renal failure due to poor by mouth intake -IV fluids for hydration -Continue IV levophed MAP>greater than 65 -Lactic acid normal -CVP is 5=willgive extra IVF bolus  3/1 progress note  Altered Mental Status   Completely alert and oriented now, On levophed drip, BP is stable now, tapering levophed.     3/2 progress note Altered Mental Status   Completely alert and oriented now, Off levophed drip, BP is stable now, had one loos BM today.  She  had fever up to 102 F overnight.     3/3 progress note: 1. Low-grade fever, hypotension, altered mental status post transient suspect due to dehydration and hypovolemic shock with acute renal failure due to poor by mouth intake Hypotension is improved. She is alert awake oriented.   Please exercise your independent, professional judgment when responding. A specific answer is not anticipated or expected.   Thank You,  Prentice [phone]

## 2015-03-14 NOTE — Progress Notes (Signed)
  Pharmacy Consult for Electrolyte Monitoring and Replacement  Indication: hypokalemia  Allergies  Allergen Reactions  . Codeine Diarrhea  . Hydrocodone Nausea And Vomiting  . Tramadol Nausea And Vomiting    Patient Measurements: Height: 4\' 11"  (149.9 cm) Weight: 137 lb (62.143 kg) IBW/kg (Calculated) : 43.2  Vital Signs: Temp: 98.6 F (37 C) (03/03 0417) Temp Source: Oral (03/03 0417) BP: 143/42 mmHg (03/03 0417) Pulse Rate: 114 (03/03 0417) Intake/Output from previous day: 03/02 0701 - 03/03 0700 In: 1900 [I.V.:900; IV Piggyback:1000] Out: 1151 [Urine:1150; Stool:1] Intake/Output from this shift:    Labs:  Recent Labs  03/11/15 1511 03/14/15 0615  WBC 11.5* 9.0  HGB 7.2* 7.8*  HCT 22.1* 23.2*  PLT 385 343  APTT 38*  --   INR 1.65  --      Recent Labs  03/11/15 1511 03/12/15 0409 03/12/15 1708 03/13/15 0456 03/14/15 0615  NA 127* 131* 131* 132* 138  K 2.4* 2.5* 2.9* 4.1 3.6  CL 84* 96* 98* 101 107  CO2 33* 28 26 25 25   GLUCOSE 136* 182* 159* 125* 93  BUN 35* 24* 14 11 9   CREATININE 1.85* 1.19* 0.96 0.86 0.50  CALCIUM 8.1* 7.6* 7.8* 7.7* 8.1*  MG 1.7  --  1.5* 2.4 1.8  PHOS 2.7  --   --  1.1* 1.7*  PROT 5.7* 5.5*  --   --   --   ALBUMIN 2.7* 2.5*  --   --   --   AST 19 15  --   --   --   ALT 10* 8*  --   --   --   ALKPHOS 76 72  --   --   --   BILITOT 0.5 0.3  --   --   --    Estimated Creatinine Clearance: 42 mL/min (by C-G formula based on Cr of 0.5).    Recent Labs  03/11/15 1818  GLUCAP 160*    Assessment: Sodium phosphate from yesterday not charted against and phosphorus improved from 1.1 to 1.7. Will schedule a reduced dose.   Plan:  K 3.6, Mg 1.8, phos 1.7 Ordered sodium phosphate 12 mmol IV x 1, will recheck electrolytes tomorrow with AM labs.  Pharmacy will continue to monitor and adjust per consult.   Laural Benes, PharmD, BCPS Clinical Pharmacist 03/14/2015,7:24 AM

## 2015-03-14 NOTE — Progress Notes (Signed)
PT Cancellation Note  Patient Details Name: Natasha [name] MRN: [id number] DOB: [birth year]   Cancelled Treatment:    Reason Eval/Treat Not Completed: Other (comment) (Treatment session attempted.  Patient currently toileting with nursing at bedside.  Will re-attempt at later time/date as patient available and medically appropriate.)   Maday Guarino H. Owens Shark, PT, DPT, NCS 03/14/2015, 10:19 AM [phone]

## 2015-03-14 NOTE — Progress Notes (Signed)
[place] at [place] NAME: Natasha [name]    MR#:  [id number]  DATE OF BIRTH:  12-28-1928  SUBJECTIVE: Condition discharge of breath. Tachycardic with heart rate up to 120  bpm. Has a diarrhea today. aFebrile this morning.   CHIEF COMPLAINT:   Chief Complaint  Patient presents with  . Altered Mental Status      REVIEW OF SYSTEMS:  CONSTITUTIONAL: , fatigue or weakness.  EYES: No blurred or double vision.  EARS, NOSE, AND THROAT: No tinnitus or ear pain.  RESPIRATORY: shortof breath today.  CARDIOVASCULAR: No chest pain, orthopnea, edema, complaints of palpitations.Marland Kitchen  GASTROINTESTINAL: No nausea, vomiting, does have diarrhea today.  GENITOURINARY: No dysuria, hematuria.  ENDOCRINE: No polyuria, nocturia,  HEMATOLOGY: No anemia, easy bruising or bleeding SKIN: No rash or lesion. MUSCULOSKELETAL: No joint pain or arthritis.   NEUROLOGIC: No tingling, numbness, weakness.  PSYCHIATRY: No anxiety or depression.   ROS  DRUG ALLERGIES:   Allergies  Allergen Reactions  . Codeine Diarrhea  . Hydrocodone Nausea And Vomiting  . Tramadol Nausea And Vomiting    VITALS:  Blood pressure 165/61, pulse 121, temperature 98.5 F (36.9 C), temperature source Oral, resp. rate 18, height 4\' 11"  (1.499 m), weight 62.143 kg (137 lb), SpO2 95 %.  PHYSICAL EXAMINATION:   GENERAL: 82 y.o.-year-old patient lying in the bed with no acute distress.  EYES: Pupils equal, round, reactive to light and accommodation. No scleral icterus. Extraocular muscles intact.  HEENT: Head atraumatic, normocephalic. Oropharynx and nasopharynx clear. Left side cochlear implant on her temporal area present. NO tenderness on both mastoid process area on pressure. NECK: Supple, no jugular venous distention. No thyroid enlargement, no tenderness.  LUNGS: Normal breath sounds bilaterally, no wheezing, rales,rhonchi or crepitation. No use of accessory muscles of  respiration.  CARDIOVASCULAR: S1, S2 normal. Tachycardic. No murmurs, rubs, or gallops.  ABDOMEN: Soft, nontender, nondistended. Bowel sounds present. No organomegaly or mass.  EXTREMITIES: 2+ pedal edema, no cyanosis, or clubbing. Spider veins present, distal pulses palpable. NEUROLOGIC: Cranial nerves II through XII are intact. Muscle strength 4 /5 in all extremities. Sensation intact. Gait not checked. subjective weakness PSYCHIATRIC: The patient is alert and oriented x 3.  SKIN: No obvious rash, lesion, or ulcer.   Physical Exam LABORATORY PANEL:   CBC  Recent Labs Lab 03/14/15 0615  WBC 9.0  HGB 7.8*  HCT 23.2*  PLT 343   ------------------------------------------------------------------------------------------------------------------  Chemistries   Recent Labs Lab 03/12/15 0409  03/14/15 0615  NA 131*  < > 138  K 2.5*  < > 3.6  CL 96*  < > 107  CO2 28  < > 25  GLUCOSE 182*  < > 93  BUN 24*  < > 9  CREATININE 1.19*  < > 0.50  CALCIUM 7.6*  < > 8.1*  MG  --   < > 1.8  AST 15  --   --   ALT 8*  --   --   ALKPHOS 72  --   --   BILITOT 0.3  --   --   < > = values in this interval not displayed. ------------------------------------------------------------------------------------------------------------------  Cardiac Enzymes  Recent Labs Lab 03/11/15 1511  TROPONINI <0.03   ------------------------------------------------------------------------------------------------------------------  RADIOLOGY:  Dg Chest 2 View  03/13/2015  CLINICAL DATA:  Complaining of fever.  Altered mental status. EXAM: CHEST  2 VIEW COMPARISON:  Chest radiograph and chest CT, 03/11/2015 FINDINGS: Small bilateral pleural effusions. Lung  base opacity most consistent with atelectasis. Pneumonia is possible. Mild diffuse interstitial thickening unchanged from the prior exams. Cardiac silhouette is normal in size. Large herniated stomach protrudes along the posterior medial right lower  hemi thorax, unchanged. No mediastinal or hilar masses. No pneumothorax. Right internal jugular central venous line is stable. Bony thorax is demineralized.  There is no left clavicle fracture. IMPRESSION: Findings are stable from the prior studies. Small pleural effusions with lung base opacity most consistent with atelectasis. Pneumonia possible. Mild diffuse interstitial thickening. This is likely chronic given the stability. Could reflect mild interstitial edema. Electronically Signed   By: Lajean Manes M.D.   On: 03/13/2015 16:13    ASSESSMENT AND PLAN:   Active Problems:   Hypotension  1. Low-grade fever, hypotension, altered mental status post transient suspect due to dehydration and hypovolemic shock with acute renal failure due to poor by mouth intake Hypotension is improved. She  is alert awake oriented.  -Patient was on empiric antibiotic with IV vancomycin and cefepime. So far no source of infection identified. Blood cultures remain negative - so discontinuing antibiotics -Lactic acid normal - 2. Acute renal failure likely ATN with poor by mouth intake'; improved with hydration. -Patient also recently had angiogram for her for vascular disease this was done on February 20 by Dr. Lucky Cowboy -Monitor I's and O's monitor creatinine closely, avoid nephrotoxins -Baseline creatinine 0.8. Creatinine on admission 1.85- now came down to 0.86.  3. Hypotension   Improved. restarted the Cardizem CD today because of tachycardia and palpitations.  4. GERD continue PPI  5. DVT prophylaxis patient on Xarelto  6. Chronic anticoagulation for severe peripheral vascular disease; restarted the Xarelto.  -#7 shortness of breath,Repeat chest x-ray today concern for fluid overload. #8 diarrhea: Likely due to stool softeners. If the diarrhea persists check of her stool for C. difficile.  9. Insomnia: Patient requested the sleep aid to light. So started on Ambien . #10 deconditioning: Physical therapy  recommended home health so we will probably discharge her home tomorrow.  All the records are reviewed and case discussed with Care Management/Social Workerr. Management plans discussed with the patient, family and they are in agreement.  CODE STATUS: full  TOTAL TIME TAKING CARE OF THIS PATIENT: 35 minutes.   POSSIBLE D/C IN 2-3 DAYS, DEPENDING ON CLINICAL CONDITION.   Epifanio Lesches M.D on 03/14/2015   Between 7am to 6pm - Pager - [phone]  After 6pm go to [email or web address] - password [email or web address] [facility]  Office  [phone]  CC: Primary care physician; Rusty Aus, MD  Note: This dictation was prepared with Dragon dictation along with smaller phrase technology. Any transcriptional errors that result from this process are unintentional.

## 2015-03-14 NOTE — Care Management Important Message (Signed)
Important Message  Patient Details  Name: Natasha [name] MRN: [id number] Date of Birth: 19-Jun-1928   Medicare Important Message Given:  Yes    Juliann Pulse A Amerie Beaumont 03/14/2015, 9:32 AM

## 2015-03-14 NOTE — Care Management Note (Addendum)
Case Management Note  Patient Details  Name: Natasha [name] MRN: [id number] Date of Birth: [birth year]  Subjective/Objective:                  Met with patient to discuss discharge planning. He is from [facility] with her husband. She has nocturnal O2 at home through [place] but currently wearing continuous which will require a new O2 assessment. She states she walks with a walker. She states she would like to use [facility] for home health.  Action/Plan: List of home health agencies left with patient. Referral called to [facility] with [place]. RNCM will fax new O2 order and assessments if patient meet criteria for continuous O2 need to [place]. RNCM will continue to follow.   Expected Discharge Date:                  Expected Discharge Plan:     In-House Referral:     Discharge planning Services  CM Consult  Post Acute Care Choice:  Home Health, Durable Medical Equipment Choice offered to:  Patient  DME Arranged:  Oxygen DME Agency:  [facility]  HH Arranged:  PT [place] Agency:  [facility]  Status of Service:  In process, will continue to follow  Medicare Important Message Given:  Yes Date Medicare IM Given:    Medicare IM give by:    Date Additional Medicare IM Given:    Additional Medicare Important Message give by:     If discussed at [place] of Stay Meetings, dates discussed:    Additional Comments:  Marshell Garfinkel, RN 03/14/2015, 11:58 AM

## 2015-03-14 NOTE — Progress Notes (Addendum)
Patient transferred from 2A to 1A. Patient came to [facility] from [facility]. PT is now recommending home health. Clinical Social Worker (CSW) met with patient to discuss D/C plan. Patient was alert and oriented X4. Patient reported that she wanted to return home to her independent living complex at [facility] with her husband. Patient requested [facility] to pick her up. Patient is currently on 2 liters of oxygen. Per patient she only wears oxygen at night however she reported that she will need to wear it all the time for a while. RN Case Manager aware of above.   Per Seth Bake admissions coordinator at [facility] patient can come to outpatient PT at [facility] or do home health with any agency she chooses. Per [facility] can probably provide transport today but not over the weekend.   Per MD patient is not medically stable for D/C today due to her heart rate. Per MD patient may D/C over the weekend. Patient reported that she prefers home health instead of outpatient PT. CSW made patient aware that [facility] will not be able to provide transport over the weekend. Per patient her husband can pick her up. Please reconsult if future social work needs arise. CSW signing off.   Blima Rich, LCSW [phone]

## 2015-03-15 LAB — BASIC METABOLIC PANEL
Anion gap: 8 (ref 5–15)
BUN: 9 mg/dL (ref 6–20)
CHLORIDE: 104 mmol/L (ref 101–111)
CO2: 23 mmol/L (ref 22–32)
CREATININE: 0.75 mg/dL (ref 0.44–1.00)
Calcium: 8.5 mg/dL — ABNORMAL LOW (ref 8.9–10.3)
Glucose, Bld: 93 mg/dL (ref 65–99)
POTASSIUM: 3.3 mmol/L — AB (ref 3.5–5.1)
SODIUM: 135 mmol/L (ref 135–145)

## 2015-03-15 LAB — MAGNESIUM: MAGNESIUM: 1.6 mg/dL — AB (ref 1.7–2.4)

## 2015-03-15 LAB — PHOSPHORUS: PHOSPHORUS: 2.7 mg/dL (ref 2.5–4.6)

## 2015-03-15 MED ORDER — FUROSEMIDE 10 MG/ML IJ SOLN
40.0000 mg | Freq: Two times a day (BID) | INTRAMUSCULAR | Status: DC
  Administered 2015-03-15 – 2015-03-16 (×3): 40 mg via INTRAVENOUS
  Filled 2015-03-15 (×3): qty 4

## 2015-03-15 MED ORDER — MAGNESIUM SULFATE 4 GM/100ML IV SOLN
4.0000 g | Freq: Once | INTRAVENOUS | Status: AC
  Administered 2015-03-15: 4 g via INTRAVENOUS
  Filled 2015-03-15: qty 100

## 2015-03-15 MED ORDER — POTASSIUM CHLORIDE CRYS ER 20 MEQ PO TBCR
40.0000 meq | EXTENDED_RELEASE_TABLET | Freq: Two times a day (BID) | ORAL | Status: AC
  Administered 2015-03-15 (×2): 40 meq via ORAL
  Filled 2015-03-15 (×2): qty 2

## 2015-03-15 NOTE — Progress Notes (Signed)
  Pharmacy Consult for Electrolyte Monitoring and Replacement  Indication: hypokalemia  Allergies  Allergen Reactions  . Codeine Diarrhea  . Hydrocodone Nausea And Vomiting  . Tramadol Nausea And Vomiting    Patient Measurements: Height: 4\' 11"  (149.9 cm) Weight: 137 lb (62.143 kg) IBW/kg (Calculated) : 43.2  Vital Signs: Temp: 98.7 F (37.1 C) (03/04 0426) Temp Source: Oral (03/04 0426) BP: 138/52 mmHg (03/04 0426) Pulse Rate: 100 (03/04 0426) Intake/Output from previous day: 03/03 0701 - 03/04 0700 In: 2270.7 [P.O.:600; I.V.:1416.7; IV Piggyback:254] Out: 0  Intake/Output from this shift:    Labs:  Recent Labs  03/14/15 0615  WBC 9.0  HGB 7.8*  HCT 23.2*  PLT 343     Recent Labs  03/13/15 0456 03/14/15 0615 03/15/15 0442  NA 132* 138 135  K 4.1 3.6 3.3*  CL 101 107 104  CO2 25 25 23   GLUCOSE 125* 93 93  BUN 11 9 9   CREATININE 0.86 0.50 0.75  CALCIUM 7.7* 8.1* 8.5*  MG 2.4 1.8 1.6*  PHOS 1.1* 1.7* 2.7   Estimated Creatinine Clearance: 42 mL/min (by C-G formula based on Cr of 0.75).   No results for input(s): GLUCAP in the last 72 hours.  Assessment: Sodium phosphate from yesterday not charted against and phosphorus improved from 1.1 to 1.7. Will schedule a reduced dose.   Plan:  K 3.3 Mg 1.6 phos 2.7 Ordered potassium chloride 40 mEq PO x 2 doses and magnesium sulfate 4 gm IV x 1, will recheck electrolytes tomorrow with AM labs.  Pharmacy will continue to monitor and adjust per consult.   Laural Benes, PharmD, BCPS Clinical Pharmacist 03/15/2015,5:26 AM

## 2015-03-15 NOTE — Progress Notes (Signed)
[place] at [place] NAME: Natasha [name]    MR#:  [id number]  DATE OF BIRTH:  02-Feb-1928  Complains of shortness of breath. Has some wheezing. Patient chest x-ray showed right-sided pleural effusion. Patient is requiring 3 L of oxygen.   CHIEF COMPLAINT:   Chief Complaint  Patient presents with  . Altered Mental Status      REVIEW OF SYSTEMS:  CONSTITUTIONAL: , fatigue or weakness.  EYES: No blurred or double vision.  EARS, NOSE, AND THROAT: No tinnitus or ear pain.  RESPIRATORY: shortof breath today. Some wheezing  CARDIOVASCULAR: No chest pain, orthopnea, edema, complaints of palpitations.Marland Kitchen  GASTROINTESTINAL: No nausea, vomiting, does have diarrhea today.  GENITOURINARY: No dysuria, hematuria.  ENDOCRINE: No polyuria, nocturia,  HEMATOLOGY: No anemia, easy bruising or bleeding SKIN: No rash or lesion. MUSCULOSKELETAL: No joint pain or arthritis.   NEUROLOGIC: No tingling, numbness, weakness.  PSYCHIATRY: No anxiety or depression.   ROS  DRUG ALLERGIES:   Allergies  Allergen Reactions  . Codeine Diarrhea  . Hydrocodone Nausea And Vomiting  . Tramadol Nausea And Vomiting    VITALS:  Blood pressure 140/52, pulse 99, temperature 98.4 F (36.9 C), temperature source Oral, resp. rate 18, height 4\' 11"  (1.499 m), weight 62.143 kg (137 lb), SpO2 90 %.  PHYSICAL EXAMINATION:   GENERAL: 82 y.o.-year-old patient lying in the bed with no acute distress. Very hard of hearing. As cochlear implant but she is out of batteries. So I wrote everything on the paper and explained to her. EYES: Pupils equal, round, reactive to light and accommodation. No scleral icterus. Extraocular muscles intact.  HEENT: Head atraumatic, normocephalic. Oropharynx and nasopharynx clear. Left side cochlear implant on her temporal area present. NO tenderness on both mastoid process area on pressure. NECK: Supple, no jugular venous distention. No  thyroid enlargement, no tenderness.  LUNGS: Faint expiratory wheeze bilaterally, decreased breath sounds at the bases., rales,rhonchi or crepitation. No use of accessory muscles of respiration.  CARDIOVASCULAR: S1, S2 normal. . No murmurs, rubs, or gallops.  ABDOMEN: Soft, nontender, nondistended. Bowel sounds present. No organomegaly or mass.  EXTREMITIES: 2+ pedal edema, no cyanosis, or clubbing. Spider veins present, distal pulses palpable. NEUROLOGIC: Cranial nerves II through XII are intact. Muscle strength 4 /5 in all extremities. Sensation intact. Gait not checked. subjective weakness PSYCHIATRIC: The patient is alert and oriented x 3.  SKIN: No obvious rash, lesion, or ulcer.   Physical Exam LABORATORY PANEL:   CBC  Recent Labs Lab 03/14/15 0615  WBC 9.0  HGB 7.8*  HCT 23.2*  PLT 343   ------------------------------------------------------------------------------------------------------------------  Chemistries   Recent Labs Lab 03/12/15 0409  03/15/15 0442  NA 131*  < > 135  K 2.5*  < > 3.3*  CL 96*  < > 104  CO2 28  < > 23  GLUCOSE 182*  < > 93  BUN 24*  < > 9  CREATININE 1.19*  < > 0.75  CALCIUM 7.6*  < > 8.5*  MG  --   < > 1.6*  AST 15  --   --   ALT 8*  --   --   ALKPHOS 72  --   --   BILITOT 0.3  --   --   < > = values in this interval not displayed. ------------------------------------------------------------------------------------------------------------------  Cardiac Enzymes  Recent Labs Lab 03/11/15 1511  TROPONINI <0.03   ------------------------------------------------------------------------------------------------------------------  RADIOLOGY:  Dg Chest 1 View  03/14/2015  CLINICAL DATA:  Shortness of breath, pneumonia EXAM: CHEST 1 VIEW COMPARISON:  Chest radiograph dated 03/13/2015. CT chest dated 03/03/2015. FINDINGS: Large right pleural effusion.  Small left pleural effusion. Underlying mild patchy right lower lobe opacity,  likely atelectasis, pneumonia not excluded. Known hiatal hernia is obscured by the pleural effusion. No pneumothorax. Heart is normal in size. Right IJ venous catheter terminates in the right atrium. IMPRESSION: Large right and small left pleural effusions, increased. Underlying mild patchy right lower lobe opacity, likely atelectasis, pneumonia not excluded. Electronically Signed   By: Julian Hy M.D.   On: 03/14/2015 10:43   Dg Chest 2 View  03/13/2015  CLINICAL DATA:  Complaining of fever.  Altered mental status. EXAM: CHEST  2 VIEW COMPARISON:  Chest radiograph and chest CT, 03/11/2015 FINDINGS: Small bilateral pleural effusions. Lung base opacity most consistent with atelectasis. Pneumonia is possible. Mild diffuse interstitial thickening unchanged from the prior exams. Cardiac silhouette is normal in size. Large herniated stomach protrudes along the posterior medial right lower hemi thorax, unchanged. No mediastinal or hilar masses. No pneumothorax. Right internal jugular central venous line is stable. Bony thorax is demineralized.  There is no left clavicle fracture. IMPRESSION: Findings are stable from the prior studies. Small pleural effusions with lung base opacity most consistent with atelectasis. Pneumonia possible. Mild diffuse interstitial thickening. This is likely chronic given the stability. Could reflect mild interstitial edema. Electronically Signed   By: Lajean Manes M.D.   On: 03/13/2015 16:13    ASSESSMENT AND PLAN:   Active Problems:   Hypotension  1. Acute respiratory distress with hypoxia secondary to pleural effusion: Likely cause at this time is a fluid overload: Stop the IV fluids, started on IV Lasix. Continue oxygen and wean as tolerated. Hypokalemia continue to replace the potassium.  Possible right-sided pneumonia:.IV Zosyn.  - 2. Acute renal failure likely ATN with poor by mouth intake'; improved with hydration.  3. Hypotension   Improved. restarted the  Cardizem CD / 4. GERD continue PPI  5. DVT prophylaxis patient on Xarelto  6. Chronic anticoagulation for severe peripheral vascular disease; restarted the Xarelto.  -#7 #8 diarrhea: Resolved. 9. Insomnia: Patient requested the sleep aid to light. So started on Ambien . #10 deconditioning: Physical therapy recommended home health   #11 because of shortness of breath requiring oxygen ,patient   Can not Go Home Today. I Explained to Her That in Writing so She Understands Now.  All the records are reviewed and case discussed with Care Management/Social Workerr. Management plans discussed with the patient, family and they are in agreement.  CODE STATUS: full  TOTAL TIME TAKING CARE OF THIS PATIENT: 35 minutes.   POSSIBLE D/C IN 2-3 DAYS, DEPENDING ON CLINICAL CONDITION.   Epifanio Lesches M.D on 03/15/2015   Between 7am to 6pm - Pager - [phone]  After 6pm go to [email or web address] - password [email or web address] [facility]  Office  [phone]  CC: Primary care physician; Rusty Aus, MD  Note: This dictation was prepared with Dragon dictation along with smaller phrase technology. Any transcriptional errors that result from this process are unintentional.

## 2015-03-16 ENCOUNTER — Inpatient Hospital Stay: Payer: Medicare Other

## 2015-03-16 LAB — BASIC METABOLIC PANEL
Anion gap: 7 (ref 5–15)
BUN: 9 mg/dL (ref 6–20)
CHLORIDE: 103 mmol/L (ref 101–111)
CO2: 26 mmol/L (ref 22–32)
CREATININE: 0.73 mg/dL (ref 0.44–1.00)
Calcium: 8.5 mg/dL — ABNORMAL LOW (ref 8.9–10.3)
GFR calc Af Amer: >60 mL/min (ref >60)
GFR calc non Af Amer: >60 mL/min (ref >60)
Glucose, Bld: 100 mg/dL — ABNORMAL HIGH (ref 65–99)
POTASSIUM: 3.5 mmol/L (ref 3.5–5.1)
Sodium: 136 mmol/L (ref 135–145)

## 2015-03-16 LAB — CULTURE, BLOOD (ROUTINE X 2)
Culture: NO GROWTH
Culture: NO GROWTH

## 2015-03-16 LAB — MAGNESIUM: Magnesium: 1.8 mg/dL (ref 1.7–2.4)

## 2015-03-16 LAB — PHOSPHORUS: Phosphorus: 3.1 mg/dL (ref 2.5–4.6)

## 2015-03-16 MED ORDER — LEVOFLOXACIN 500 MG PO TABS: 500.0000 mg | ORAL_TABLET | Freq: Every day | ORAL | Status: DC

## 2015-03-16 MED ORDER — POTASSIUM CHLORIDE CRYS ER 20 MEQ PO TBCR
40.0000 meq | EXTENDED_RELEASE_TABLET | Freq: Once | ORAL | Status: AC
  Administered 2015-03-16: 40 meq via ORAL
  Filled 2015-03-16: qty 2

## 2015-03-16 NOTE — Progress Notes (Signed)
Resident 02 92% on Ra stated she has oxygen at the nursing home and she only wears it at night per her and her daughter. No signs or symptoms of any respiratory distress.

## 2015-03-16 NOTE — Care Management Note (Signed)
Case Management Note  Patient Details  Name: KELSA PORTWOOD MRN: [id number] Date of Birth: 1928-05-08  Subjective/Objective:    Received call from Mrs McGowan's daughter-in-law Lorretta Harp requesting no home health services. Barbera Setters will call her husband, Mrs McGowan's son to verify that this is whatr the family wants and will then notify this case Freight forwarder. A referral for home health has been faxed earlier this morning to [place].                 Action/Plan:   Expected Discharge Date:                  Expected Discharge Plan:     In-House Referral:     Discharge planning Services  CM Consult  Post Acute Care Choice:  Home Health, Durable Medical Equipment Choice offered to:  Patient  DME Arranged:  Oxygen DME Agency:  [facility]  HH Arranged:  PT [place] Agency:  [place]  Status of Service:  In process, will continue to follow  Medicare Important Message Given:  Yes Date Medicare IM Given:    Medicare IM give by:    Date Additional Medicare IM Given:    Additional Medicare Important Message give by:     If discussed at [place] of Stay Meetings, dates discussed:    Additional Comments:  Cheyrl Buley A, RN 03/16/2015, 10:37 AM

## 2015-03-16 NOTE — Care Management Note (Signed)
Case Management Note  Patient Details  Name: Natasha [name] MRN: [id number] Date of Birth: 07-12-1928  Subjective/Objective:       [name] referral was called to [facility] at [facility] requesting increase in oxygen from nocturnal to continuous 2L o2. [facility] will deliver [name] portable oxygen tank to Natasha [name]'s hospital room today. [name] referral was faxed to [place] requesting home health PT. [facility] will contact Natasha [name] at home within 24-48 hours.              Action/Plan:   Expected Discharge Date:                  Expected Discharge Plan:     In-House Referral:     Discharge planning Services  CM Consult  Post Acute Care Choice:  Home Health, Durable Medical Equipment Choice offered to:  Patient  DME Arranged:  Oxygen DME Agency:  [facility]  HH Arranged:  PT [place] Agency:  [place]  Status of Service:  In process, will continue to follow  Medicare Important Message Given:  Yes Date Medicare IM Given:    Medicare IM give by:    Date Additional Medicare IM Given:    Additional Medicare Important Message give by:     If discussed at [place] of Stay Meetings, dates discussed:    Additional Comments:  Natasha Eisenhardt A, RN 03/16/2015, 10:11 AM

## 2015-03-16 NOTE — Progress Notes (Signed)
[place] [facility] NAME: Natasha [name]    MR#:  [id number]  DATE OF BIRTH:  [birth year]  Section says she feels better today. Repeat chest x-ray showed no pleural effusion. Does have right-sided pneumonia. Patient is afebrile. She says she feels better. I will discharge her home.   CHIEF COMPLAINT:   Chief Complaint  Patient presents with  . Altered Mental Status      REVIEW OF SYSTEMS:  CONSTITUTIONAL: , fatigue or weakness.  EYES: No blurred or double vision.  EARS, NOSE, AND THROAT: No tinnitus or ear pain.  RESPIRATORY: shortof breath today. Some wheezing  CARDIOVASCULAR: No chest pain, orthopnea, edema, complaints of palpitations.Marland Kitchen  GASTROINTESTINAL: No nausea, vomiting, does have diarrhea today.  GENITOURINARY: No dysuria, hematuria.  ENDOCRINE: No polyuria, nocturia,  HEMATOLOGY: No anemia, easy bruising or bleeding SKIN: No rash or lesion. MUSCULOSKELETAL: No joint pain or arthritis.   NEUROLOGIC: No tingling, numbness, weakness.  PSYCHIATRY: No anxiety or depression.   ROS  DRUG ALLERGIES:   Allergies  Allergen Reactions  . Codeine Diarrhea  . Hydrocodone Nausea And Vomiting  . Tramadol Nausea And Vomiting    VITALS:  Blood pressure 144/45, pulse 103, temperature 98.9 F (37.2 C), temperature source Oral, resp. rate 16, height 4\' 11"  (1.499 m), weight 62.143 kg (137 lb), SpO2 94 %.  PHYSICAL EXAMINATION:   GENERAL: 82 y.o.-year-old patient lying in the bed with no acute distress. Very hard of hearing. As cochlear implant but she is out of batteries. So I wrote everything on the paper and explained to her. EYES: Pupils equal, round, reactive to light and accommodation. No scleral icterus. Extraocular muscles intact.  HEENT: Head atraumatic, normocephalic. Oropharynx and nasopharynx clear. Left side cochlear implant on her temporal area present. NO tenderness on both mastoid process area on  pressure. NECK: Supple, no jugular venous distention. No thyroid enlargement, no tenderness.  LUNGS: Faint expiratory wheeze bilaterally, decreased breath sounds at the bases., rales,rhonchi or crepitation. No use of accessory muscles of respiration.  CARDIOVASCULAR: S1, S2 normal. . No murmurs, rubs, or gallops.  ABDOMEN: Soft, nontender, nondistended. Bowel sounds present. No organomegaly or mass.  EXTREMITIES: 2+ pedal edema, no cyanosis, or clubbing. Spider veins present, distal pulses palpable. NEUROLOGIC: Cranial nerves II through XII are intact. Muscle strength 4 /5 in all extremities. Sensation intact. Gait not checked. subjective weakness PSYCHIATRIC: The patient is alert and oriented x 3.  SKIN: No obvious rash, lesion, or ulcer.   Physical Exam LABORATORY PANEL:   CBC  Recent Labs Lab 03/14/15 0615  WBC 9.0  HGB 7.8*  HCT 23.2*  PLT 343   ------------------------------------------------------------------------------------------------------------------  Chemistries   Recent Labs Lab 03/12/15 0409  03/16/15 0459  NA 131*  < > 136  K 2.5*  < > 3.5  CL 96*  < > 103  CO2 28  < > 26  GLUCOSE 182*  < > 100*  BUN 24*  < > 9  CREATININE 1.19*  < > 0.73  CALCIUM 7.6*  < > 8.5*  MG  --   < > 1.8  AST 15  --   --   ALT 8*  --   --   ALKPHOS 72  --   --   BILITOT 0.3  --   --   < > = values in this interval not displayed. ------------------------------------------------------------------------------------------------------------------  Cardiac Enzymes  Recent Labs Lab 03/11/15 1511  TROPONINI <0.03   ------------------------------------------------------------------------------------------------------------------  RADIOLOGY:  Dg Chest 1 View  03/16/2015  CLINICAL DATA:  Hypotension, altered mental status, shortness of breath, history GERD, asthma, former smoker, hypertension EXAM: CHEST 1 VIEW COMPARISON:  Portable exam 0816 hours compared to 03/14/2015  FINDINGS: RIGHT jugular central venous catheter again projects over the RIGHT atrium ; recommend withdrawal 5 cm to place tip at cavoatrial junction. Enlargement of cardiac silhouette. Mediastinal contours and pulmonary vascularity normal. Atelectasis versus infiltrate at RIGHT lower lobe. RIGHT middle lobe opacity improved. Minimal LEFT basilar atelectasis. Upper lungs clear. No gross pleural effusion or pneumothorax. IMPRESSION: Persistent RIGHT lower lobe consolidation with improved aeration RIGHT middle lobe. Minimal LEFT basilar atelectasis unchanged. Tip of RIGHT jugular central venous catheter projects over RIGHT atrium; recommend withdrawal 5 cm to place tip at the cavoatrial junction. Findings called to patient's nurse Margarita Grizzle RN on 03/16/2015 at 0914 hours. Electronically Signed   By: Lavonia Dana M.D.   On: 03/16/2015 09:16    ASSESSMENT AND PLAN:   Active Problems:   Hypotension  1. Acute respiratory distress with hypoxia secondary to pleural effusion: Likely cause at this time is a fluid overload: Approved. Patient can resume the home dose Lasix. And continue oxygen that she was taking before. Possible right-sided pneumonia:.d/c today with PO levauin.  - 2. Acute renal failure likely ATN with poor by mouth intake'; improved with hydration.  3. Hypotension   Improved. restarted the Cardizem CD / 4. GERD continue PPI  5. DVT prophylaxis patient on Xarelto  6. Chronic anticoagulation for severe peripheral vascular disease; restarted the Xarelto.  -#7 #8 diarrhea: Resolved. 9. Insomnia: Patient requested the sleep aid to light. So started on Ambien . #10 deconditioning: Physical therapy recommended home health   #11. Discharge today All the records are reviewed and case discussed with Care Management/Social Workerr. Management plans discussed with the patient, family and they are in agreement.  CODE STATUS: full  TOTAL TIME TAKING CARE OF THIS PATIENT: 20 minutes.   POSSIBLE  D/C IN 2-3 DAYS, DEPENDING ON CLINICAL CONDITION.   Epifanio Lesches M.D on 03/16/2015   Between 7am to 6pm - Pager - [phone]  After 6pm go to [email or web address] - password [email or web address] [facility]  Office  [phone]  CC: Primary care physician; Rusty Aus, MD  Note: This dictation was prepared with Dragon dictation along with smaller phrase technology. Any transcriptional errors that result from this process are unintentional.

## 2015-03-16 NOTE — Progress Notes (Signed)
Explained discharged to daughter and patient

## 2015-03-16 NOTE — Progress Notes (Signed)
Discharge home no complaints of pain. Picc line pulled intact. Small amt or bleeding noted, family packed belonging, taken to car by nursing. Back to [facility].

## 2015-03-16 NOTE — Progress Notes (Signed)
  Pharmacy Consult for Electrolyte Monitoring and Replacement  Indication: hypokalemia  Allergies  Allergen Reactions  . Codeine Diarrhea  . Hydrocodone Nausea And Vomiting  . Tramadol Nausea And Vomiting    Patient Measurements: Height: 4\' 11"  (149.9 cm) Weight: 137 lb (62.143 kg) IBW/kg (Calculated) : 43.2  Vital Signs: Temp: 98.9 F (37.2 C) (03/05 0504) Temp Source: Oral (03/05 0504) BP: 120/46 mmHg (03/05 0504) Pulse Rate: 99 (03/05 0504) Intake/Output from previous day: 03/04 0701 - 03/05 0700 In: 120 [P.O.:120] Out: 1050 [Urine:1050] Intake/Output from this shift: Total I/O In: -  Out: 200 [Urine:200]  Labs:  Recent Labs  03/14/15 0615  WBC 9.0  HGB 7.8*  HCT 23.2*  PLT 343     Recent Labs  03/14/15 0615 03/15/15 0442 03/16/15 0459  NA 138 135 136  K 3.6 3.3* 3.5  CL 107 104 103  CO2 25 23 26   GLUCOSE 93 93 100*  BUN 9 9 9   CREATININE 0.50 0.75 0.73  CALCIUM 8.1* 8.5* 8.5*  MG 1.8 1.6* 1.8  PHOS 1.7* 2.7 3.1   Estimated Creatinine Clearance: 42 mL/min (by C-G formula based on Cr of 0.73).   No results for input(s): GLUCAP in the last 72 hours.  Assessment: Sodium phosphate from yesterday not charted against and phosphorus improved from 1.1 to 1.7. Will schedule a reduced dose.   Plan:  K 3.5 Mg 1.8 phos 3.1 Ordered potassium chloride 40 mEq PO x 1 dose, will recheck electrolytes tomorrow with AM labs.  Pharmacy will continue to monitor and adjust per consult.   Laural Benes, PharmD, BCPS Clinical Pharmacist 03/16/2015,6:02 AM

## 2015-03-17 NOTE — Discharge Summary (Signed)
Natasha [name], is a 82 y.o. female  DOB [birth year]  MRN [id number].  Admission date:  03/11/2015  Admitting Physician  Fritzi Mandes, MD  Discharge Date:  03/16/2015   Primary MD  Rusty Aus, MD  Recommendations for primary care physician for things to follow:   Follow-up with primary doctor Dr. Emily Filbert in 1 week.   Admission Diagnosis  Hypokalemia [E87.6] Hyponatremia [E87.1] HCAP (healthcare-associated pneumonia) [J18.9] Septic shock ([place]) [A41.9, R65.21] Acute renal failure, unspecified acute renal failure type ([place]) [N17.9]   Discharge Diagnosis  Hypokalemia [E87.6] Hyponatremia [E87.1] HCAP (healthcare-associated pneumonia) [J18.9] Septic shock ([place]) [A41.9, R65.21] Acute renal failure, unspecified acute renal failure type ([place]) [N17.9]   Active Problems:   Hypotension      Past Medical History  Diagnosis Date  . GERD (gastroesophageal reflux disease)   . Asthma   . Anemia   . Arthritis   . H/O blood clots 2014  . Gallstones 2015  . Hypertension     Past Surgical History  Procedure Laterality Date  . Abdominal hysterectomy  1960  . Appendectomy  1946  . Cochlear implant  2009  . Eye surgery  [phone]    cataract  . Stent placement  2006-2014    multiple stent placements in legs  . Colonoscopy  2015    Dr. Rayann Heman   . Cholecystectomy  04-24-13  . Breast biopsy Bilateral     negative  . Peripheral vascular catheterization N/A 02/27/2015    Procedure: Abdominal Aortogram w/Lower Extremity;  Surgeon: Algernon Huxley, MD;  Location: [place] [facility];  Service: Cardiovascular;  Laterality: N/A;  . Peripheral vascular catheterization  02/27/2015    Procedure: Lower Extremity Intervention;  Surgeon: Algernon Huxley, MD;  Location: [place];  Service: Cardiovascular;;  . Peripheral vascular  catheterization Left 02/28/2015    Procedure: Lower Extremity Angiography;  Surgeon: Algernon Huxley, MD;  Location: [place] [facility];  Service: Cardiovascular;  Laterality: Left;  . Peripheral vascular catheterization  02/28/2015    Procedure: Lower Extremity Intervention;  Surgeon: Algernon Huxley, MD;  Location: [place] [facility];  Service: Cardiovascular;;       History of present illness and  Hospital Course:     Kindly see H&P for history of present illness and admission details, please review complete Labs, Consult reports and Test reports for all details in brief  HPI  from the history and physical done on the day of admission  82 year old female patient admitted for altered mental status, low blood pressure. Blood pressure was 60/30 in the ER> admitted for course sepsis, received IV vancomycin, cefepime. Admitted to ICU for course sepsis, received IV fluids.    Hospital Course #1 sepsis due to pneumonia: Discharge home with Levaquin. Patient received IV hydration, Levophed. Blood cultures negative. Lactic acid normal. Urine cultures negative. Patient to mental status improved with IV hydration. Transferred from ICU.  #2 acute renal failure due to ATN due to poor by mouth intake: Improved with IV hydration. #3 GERD continue PPIs. #4 chronic respiratory failure: Use 2 L of oxygen. 5.Chronic anticoagulation secondary to peripheral vascular disease. 6.Deconditioning physical therapy recommended PT . 7.Acute respiratory failure secondary to  Pleural  effusion. Received Lasix, Levaquin. Breathing improved and back to her baseline.  8.Acute metabolic encephalopathy  secondary to hypotension and fever.acute renal failure;: Improving mental status.   9. hypertension. Patient initially was hypotensive: Her Lasix, Cardizem were held. But after improvement in her  condition blood pressure restarted to  Become normal , also became tachycardic so we had to resume her Cardizem, Lasix.  Discharge  Condition: stable   Follow UP  Follow-up Information    Follow up with [facility] .   Specialties:  [place], [place]   Contact information:   [address] [place] [phone]        Discharge Instructions  and  Discharge Medications     Discharge Instructions    Face-to-face encounter (required for Medicare/Medicaid patients)    Complete by:  As directed   I [facility] certify that this patient is under my care and that I, or a nurse practitioner or physician's assistant working with me, had a face-to-face encounter that meets the physician face-to-face encounter requirements with this patient on 03/16/2015. The encounter with the patient was in whole, or in part for the following medical condition(s) which is the primary reason for home health care  Pneumonia decondition  The encounter with the patient was in whole, or in part, for the following medical condition, which is the primary reason for home health care:  whole  I certify that, based on my findings, the following services are medically necessary home health services:  Physical therapy  Reason for Medically Necessary Home Health Services:  Therapy- Therapeutic Exercises to Increase Strength and Endurance  My clinical findings support the need for the above services:  Unsafe ambulation due to balance issues  Further, I certify that my clinical findings support that this patient is homebound due to:  Unable to leave home safely without assistance     Home Health    Complete by:  As directed   To provide the following care/treatments:  PT            Medication List    TAKE these medications        albuterol 108 (90 Base) MCG/ACT inhaler  Commonly known as:  PROVENTIL HFA;VENTOLIN HFA  Inhale 2 puffs into the lungs 2 (two) times daily.     budesonide 180 MCG/ACT inhaler  Commonly known as:  PULMICORT  Inhale 2 puffs into the  lungs 2 (two) times daily.     CALCIUM 1200+D3 PO  Take 1 tablet by mouth daily.     cilostazol 100 MG tablet  Commonly known as:  PLETAL  Take 200 mg by mouth 2 (two) times daily.     diltiazem 180 MG 24 hr capsule  Commonly known as:  DILACOR XR  Take 180 mg by mouth daily.     furosemide 20 MG tablet  Commonly known as:  LASIX  Take 20 mg by mouth 2 (two) times daily.     gabapentin 300 MG capsule  Commonly known as:  NEURONTIN  Take 300 mg by mouth 2 (two) times daily.     HYDROmorphone 2 MG tablet  Commonly known as:  DILAUDID  Take 1 tablet (2 mg total) by mouth 3 (three) times daily as needed for severe pain.     levofloxacin 750 MG tablet  Commonly known as:  LEVAQUIN  Take 1 tablet (750 mg total) by mouth daily.     levofloxacin 500 MG tablet  Commonly known as:  LEVAQUIN  Take 1 tablet (500 mg total) by mouth daily.     Melatonin 5 MG Tabs  Take 5-10 mg by mouth at bedtime as needed (for sleep).     montelukast 10 MG tablet  Commonly  known as:  SINGULAIR  Take 10 mg by mouth daily.     omeprazole 20 MG capsule  Commonly known as:  PRILOSEC  Take 20 mg by mouth at bedtime.     pramipexole 0.25 MG tablet  Commonly known as:  MIRAPEX  Take 0.25 mg by mouth 2 (two) times daily.     rivaroxaban 20 MG Tabs tablet  Commonly known as:  XARELTO  Take 20 mg by mouth daily with supper.     senna-docusate 8.6-50 MG tablet  Commonly known as:  Senokot-S  Take 1-2 tablets by mouth daily as needed for mild constipation.     simvastatin 20 MG tablet  Commonly known as:  ZOCOR  Take 20 mg by mouth daily.          Diet and Activity recommendation: See Discharge Instructions above   Consults obtained - pulmonary   Major procedures and Radiology Reports - PLEASE review detailed and final reports for all details, in brief -     Ct Abdomen Pelvis Wo Contrast  03/11/2015  CLINICAL DATA:  Code Sepsis. Pt brought to ED via [facility] who picked her up at  her residence at [facility]. Staff and husband state that she has had altered mental status since about 12:45 today. Pt has recently been treated with levoquin, likely for a respiratory infection, as patient reports frequent coughing. EXAM: CT CHEST, ABDOMEN AND PELVIS WITHOUT CONTRAST TECHNIQUE: Multidetector CT imaging of the chest, abdomen and pelvis was performed following the standard protocol without IV contrast. COMPARISON:  Abdomen CT, 06/07/2014. Multiple prior chest radiographs. FINDINGS: CT CHEST Neck base and axilla:  No mass or adenopathy. Mediastinum and hila: Large hernia with the stomach protruding to the right of midline into the right posterior in the thorax. Stomach is mildly dilated above this from the level of the Carina to the neck base. Heart is normal in size and configuration. There are mild coronary artery calcifications. Great vessels are normal in caliber. No mediastinal or hilar masses or pathologically enlarged lymph nodes. Lungs and pleura: Minimal pleural effusions. There is opacity in the dependent lower lobes and in the right lower lobe adjacent to the herniated stomach, most consistent atelectasis although pneumonia is possible. There is diffuse interstitial thickening. Calcified granuloma is noted in the right middle lobe. There is no convincing pneumonia. No pneumothorax. CT ABDOMEN AND PELVIS Hepatobiliary: Normal liver. Gallbladder surgically absent. Bile duct normal in caliber for age. Spleen, pancreas, adrenal glands:  Unremarkable. Kidneys, ureters, bladder: 18 mm posterior midpole right renal mass consistent with a cyst. No other renal masses, no stones and no hydronephrosis. Ureters normal in course and in caliber. Normal bladder. Uterus and adnexa: Uterus surgically absent. No adnexal/ pelvic masses. Vascular: There is atherosclerotic calcification along the abdominal aorta and iliac vessels. No aneurysm. There has been previous vascular surgery with bilateral  inguinal scarring and surgical clips. There is a left superficial femoral artery graft. Lymph nodes:  No adenopathy. Ascites:  None. Gastrointestinal: No evidence of bowel obstruction. No bowel dilation. No wall thickening. No mesenteric inflammation. Appendix surgically absent. MUSCULOSKELETAL No osteoblastic or osteolytic lesions. Bones are demineralized. Degenerative changes are noted throughout the visualized spine. IMPRESSION: 1. Minimal pleural effusions and bilateral interstitial thickening. Mild congestive heart failure suspected. 2. Additional opacity in the lower lobes most likely atelectasis. Pneumonia is not excluded but felt less likely. 3. No acute findings in the abdomen pelvis. No evidence of a source of infection. Electronically Signed  By: Lajean Manes M.D.   On: 03/11/2015 17:44   Dg Chest 1 View  03/16/2015  CLINICAL DATA:  Hypotension, altered mental status, shortness of breath, history GERD, asthma, former smoker, hypertension EXAM: CHEST 1 VIEW COMPARISON:  Portable exam 0816 hours compared to 03/14/2015 FINDINGS: RIGHT jugular central venous catheter again projects over the RIGHT atrium ; recommend withdrawal 5 cm to place tip at cavoatrial junction. Enlargement of cardiac silhouette. Mediastinal contours and pulmonary vascularity normal. Atelectasis versus infiltrate at RIGHT lower lobe. RIGHT middle lobe opacity improved. Minimal LEFT basilar atelectasis. Upper lungs clear. No gross pleural effusion or pneumothorax. IMPRESSION: Persistent RIGHT lower lobe consolidation with improved aeration RIGHT middle lobe. Minimal LEFT basilar atelectasis unchanged. Tip of RIGHT jugular central venous catheter projects over RIGHT atrium; recommend withdrawal 5 cm to place tip at the cavoatrial junction. Findings called to patient's nurse Margarita Grizzle RN on 03/16/2015 at 0914 hours. Electronically Signed   By: Lavonia Dana M.D.   On: 03/16/2015 09:16   Dg Chest 1 View  03/14/2015  CLINICAL DATA:   Shortness of breath, pneumonia EXAM: CHEST 1 VIEW COMPARISON:  Chest radiograph dated 03/13/2015. CT chest dated 03/03/2015. FINDINGS: Large right pleural effusion.  Small left pleural effusion. Underlying mild patchy right lower lobe opacity, likely atelectasis, pneumonia not excluded. Known hiatal hernia is obscured by the pleural effusion. No pneumothorax. Heart is normal in size. Right IJ venous catheter terminates in the right atrium. IMPRESSION: Large right and small left pleural effusions, increased. Underlying mild patchy right lower lobe opacity, likely atelectasis, pneumonia not excluded. Electronically Signed   By: Julian Hy M.D.   On: 03/14/2015 10:43   Dg Chest 1 View  03/03/2015  CLINICAL DATA:  Community-acquired pneumonia. EXAM: CHEST 1 VIEW COMPARISON:  02/28/2015. FINDINGS: The cardiac silhouette remains borderline enlarged. A large hiatal hernia is unchanged. Significant improvement in the previously seen right upper lobe airspace opacity with minimal residual opacity. Small amount of linear density at the left lung base. Mildly prominent interstitial markings. Diffuse osteopenia. Old left clavicle fracture. IMPRESSION: 1. Significantly improved right upper lobe pneumonia. 2. Interval mild left basilar atelectasis. 3. Stable large hiatal hernia and mild chronic interstitial lung disease. Electronically Signed   By: Claudie Revering M.D.   On: 03/03/2015 09:14   Dg Chest 1 View  02/28/2015  CLINICAL DATA:  Labored breathing. EXAM: CHEST 1 VIEW COMPARISON:  August 17, 2014. FINDINGS: Stable cardiomediastinal silhouette. No pneumothorax or significant pleural effusion is noted. Large hiatal hernia is noted. Left lung is clear. Right upper lobe airspace opacity is noted concerning for pneumonia. Right internal jugular catheter is noted with distal tip in expected position of cavoatrial junction. Old left clavicular fracture is noted. IMPRESSION: Right upper lobe pneumonia.  Large hiatal  hernia. Electronically Signed   By: Marijo Conception, M.D.   On: 02/28/2015 13:01   Dg Chest 2 View  03/13/2015  CLINICAL DATA:  Complaining of fever.  Altered mental status. EXAM: CHEST  2 VIEW COMPARISON:  Chest radiograph and chest CT, 03/11/2015 FINDINGS: Small bilateral pleural effusions. Lung base opacity most consistent with atelectasis. Pneumonia is possible. Mild diffuse interstitial thickening unchanged from the prior exams. Cardiac silhouette is normal in size. Large herniated stomach protrudes along the posterior medial right lower hemi thorax, unchanged. No mediastinal or hilar masses. No pneumothorax. Right internal jugular central venous line is stable. Bony thorax is demineralized.  There is no left clavicle fracture. IMPRESSION: Findings are stable from the prior  studies. Small pleural effusions with lung base opacity most consistent with atelectasis. Pneumonia possible. Mild diffuse interstitial thickening. This is likely chronic given the stability. Could reflect mild interstitial edema. Electronically Signed   By: Lajean Manes M.D.   On: 03/13/2015 16:13   Ct Chest Wo Contrast  03/11/2015  CLINICAL DATA:  Code Sepsis. Pt brought to ED via [facility] who picked her up at her residence at [facility]. Staff and husband state that she has had altered mental status since about 12:45 today. Pt has recently been treated with levoquin, likely for a respiratory infection, as patient reports frequent coughing. EXAM: CT CHEST, ABDOMEN AND PELVIS WITHOUT CONTRAST TECHNIQUE: Multidetector CT imaging of the chest, abdomen and pelvis was performed following the standard protocol without IV contrast. COMPARISON:  Abdomen CT, 06/07/2014. Multiple prior chest radiographs. FINDINGS: CT CHEST Neck base and axilla:  No mass or adenopathy. Mediastinum and hila: Large hernia with the stomach protruding to the right of midline into the right posterior in the thorax. Stomach is mildly dilated above this from  the level of the Carina to the neck base. Heart is normal in size and configuration. There are mild coronary artery calcifications. Great vessels are normal in caliber. No mediastinal or hilar masses or pathologically enlarged lymph nodes. Lungs and pleura: Minimal pleural effusions. There is opacity in the dependent lower lobes and in the right lower lobe adjacent to the herniated stomach, most consistent atelectasis although pneumonia is possible. There is diffuse interstitial thickening. Calcified granuloma is noted in the right middle lobe. There is no convincing pneumonia. No pneumothorax. CT ABDOMEN AND PELVIS Hepatobiliary: Normal liver. Gallbladder surgically absent. Bile duct normal in caliber for age. Spleen, pancreas, adrenal glands:  Unremarkable. Kidneys, ureters, bladder: 18 mm posterior midpole right renal mass consistent with a cyst. No other renal masses, no stones and no hydronephrosis. Ureters normal in course and in caliber. Normal bladder. Uterus and adnexa: Uterus surgically absent. No adnexal/ pelvic masses. Vascular: There is atherosclerotic calcification along the abdominal aorta and iliac vessels. No aneurysm. There has been previous vascular surgery with bilateral inguinal scarring and surgical clips. There is a left superficial femoral artery graft. Lymph nodes:  No adenopathy. Ascites:  None. Gastrointestinal: No evidence of bowel obstruction. No bowel dilation. No wall thickening. No mesenteric inflammation. Appendix surgically absent. MUSCULOSKELETAL No osteoblastic or osteolytic lesions. Bones are demineralized. Degenerative changes are noted throughout the visualized spine. IMPRESSION: 1. Minimal pleural effusions and bilateral interstitial thickening. Mild congestive heart failure suspected. 2. Additional opacity in the lower lobes most likely atelectasis. Pneumonia is not excluded but felt less likely. 3. No acute findings in the abdomen pelvis. No evidence of a source of  infection. Electronically Signed   By: Lajean Manes M.D.   On: 03/11/2015 17:44   Dg Chest Portable 1 View  03/11/2015  CLINICAL DATA:  Status post central line placement EXAM: PORTABLE CHEST 1 VIEW COMPARISON:  03/11/2015 FINDINGS: Cardiac shadow remains enlarged. A right jugular central line is noted with the catheter tip deep in the right atrium. This should be withdrawn approximately 5 cm. No pneumothorax is noted. Changes of large diaphragmatic hernia are again identified and stable. Some left basilar atelectasis and effusion is noted. IMPRESSION: Status post central line placement without pneumothorax. The line is deep in the right atrium and should be withdrawn 5 cm. Electronically Signed   By: Inez Catalina M.D.   On: 03/11/2015 16:55   Dg Chest [facility]  03/11/2015  CLINICAL DATA:  Altered mental status EXAM: PORTABLE CHEST 1 VIEW COMPARISON:  2/20/ 17 FINDINGS: Cardiomediastinal silhouette is stable. Again noted large right diaphragmatic/hiatal hernia. Stable mild elevation of the left hemidiaphragm. Left basilar atelectasis or scarring. No superimposed infiltrate or pulmonary edema. IMPRESSION: Again noted large right diaphragmatic/hiatal hernia. Stable mild elevation of the left hemidiaphragm. Left basilar atelectasis or scarring. No superimposed infiltrate or pulmonary edema. Electronically Signed   By: Lahoma Crocker M.D.   On: 03/11/2015 15:46    Micro Results    Recent Results (from the past 240 hour(s))  Blood Culture (routine x 2)     Status: None   Collection Time: 03/11/15  3:12 PM  Result Value Ref Range Status   Specimen Description BLOOD RIGHT FATTY CASTS  Final   Special Requests   Final    BOTTLES DRAWN AEROBIC AND ANAEROBIC [place] AER 10CC ANA   Culture NO GROWTH 5 DAYS  Final   Report Status 03/16/2015 FINAL  Final  Blood Culture (routine x 2)     Status: None   Collection Time: 03/11/15  3:12 PM  Result Value Ref Range Status   Specimen Description BLOOD LEFT HAND   Final   Special Requests BOTTLES DRAWN AEROBIC AND ANAEROBIC 6CC  Final   Culture NO GROWTH 5 DAYS  Final   Report Status 03/16/2015 FINAL  Final  Urine culture     Status: None   Collection Time: 03/11/15  5:40 PM  Result Value Ref Range Status   Specimen Description URINE, CLEAN CATCH  Final   Special Requests NONE  Final   Culture NO GROWTH 2 DAYS  Final   Report Status 03/13/2015 FINAL  Final  Rapid Influenza A&B Antigens ([place] only)     Status: None   Collection Time: 03/11/15  5:40 PM  Result Value Ref Range Status   Influenza A ([place]) NEGATIVE NEGATIVE Final   Influenza B ([facility]) NEGATIVE NEGATIVE Final  MRSA PCR Screening     Status: None   Collection Time: 03/11/15  6:16 PM  Result Value Ref Range Status   MRSA by PCR NEGATIVE NEGATIVE Final    Comment:        The GeneXpert MRSA Assay (FDA approved for NASAL specimens only), is one component of a comprehensive MRSA colonization surveillance program. It is not intended to diagnose MRSA infection nor to guide or monitor treatment for MRSA infections.   Culture, sputum-assessment     Status: None   Collection Time: 03/12/15  4:09 AM  Result Value Ref Range Status   Specimen Description SPUTUM  Final   Special Requests NONE  Final   Sputum evaluation   Final    Sputum specimen not acceptable for testing.  Please recollect.   NOTIFIED RN RENE BABB ON 03/12/15 AT [id number] [facility]    Report Status 03/13/2015 FINAL  Final       Today   Subjective:   Theodoro Doing today has no headache,no chest abdominal pain,no new weakness tingling or numbness, feels much better wants to go home today.   Objective:   Blood pressure 144/45, pulse 103, temperature 98.9 F (37.2 C), temperature source Oral, resp. rate 16, height 4\' 11"  (1.499 m), weight 62.143 kg (137 lb), SpO2 94 %.  No intake or output data in the 24 hours ending 03/17/15 1250  Exam Awake Alert, Oriented x 3, No new F.N deficits, Normal  affect [place].AT,PERRAL Supple Neck,No JVD, No cervical lymphadenopathy appriciated.  Symmetrical Chest wall  movement, Good air movement bilaterally, CTAB RRR,No Gallops,Rubs or new Murmurs, No Parasternal Heave +ve B.Sounds, Abd Soft, Non tender, No organomegaly appriciated, No rebound -guarding or rigidity. No Cyanosis, Clubbing or edema, No new Rash or bruise  Data Review   CBC w Diff:  Lab Results  Component Value Date   WBC 9.0 03/14/2015   WBC 7.6 01/28/2014   WBC 5.9 04/19/2013   HGB 7.8* 03/14/2015   HGB 9.3* 01/28/2014   HCT 23.2* 03/14/2015   HCT 29.2* 01/28/2014   PLT 343 03/14/2015   PLT 255 01/28/2014   LYMPHOPCT 5 03/11/2015   LYMPHOPCT 19.4 01/28/2014   MONOPCT 7 03/11/2015   MONOPCT 10.7 01/28/2014   EOSPCT 0 03/11/2015   EOSPCT 1.3 01/28/2014   BASOPCT 0 03/11/2015   BASOPCT 0.8 01/28/2014    CMP:  Lab Results  Component Value Date   NA 136 03/16/2015   NA 140 01/28/2014   NA 139 04/19/2013   K 3.5 03/16/2015   K 3.3* 01/28/2014   CL 103 03/16/2015   CL 106 01/28/2014   CO2 26 03/16/2015   CO2 25 01/28/2014   BUN 9 03/16/2015   BUN 21* 05/08/2014   BUN 21 04/19/2013   CREATININE 0.73 03/16/2015   CREATININE 0.74 05/08/2014   PROT 5.5* 03/12/2015   PROT 4.9* 05/10/2013   PROT 6.6 05/03/2013   ALBUMIN 2.5* 03/12/2015   ALBUMIN 2.0* 05/10/2013   ALBUMIN 4.3 05/03/2013   BILITOT 0.3 03/12/2015   BILITOT 0.4 05/10/2013   ALKPHOS 72 03/12/2015   ALKPHOS 63 05/10/2013   AST 15 03/12/2015   AST 67* 05/10/2013   ALT 8* 03/12/2015   ALT 12 05/10/2013  .   Total Time in preparing paper work, data evaluation and todays exam - 84 minutes  Bryann Gentz M.D on 03/16/2015 at 12:50 PM    Note: This dictation was prepared with Dragon dictation along with smaller phrase technology. Any transcriptional errors that result from this process are unintentional.

## 2015-04-14 ENCOUNTER — Ambulatory Visit (INDEPENDENT_AMBULATORY_CARE_PROVIDER_SITE_OTHER): Payer: Medicare Other | Admitting: Podiatry

## 2015-04-14 ENCOUNTER — Encounter: Payer: Self-pay | Admitting: Podiatry

## 2015-04-14 DIAGNOSIS — L603 Nail dystrophy: Secondary | ICD-10-CM

## 2015-04-14 DIAGNOSIS — M79676 Pain in unspecified toe(s): Secondary | ICD-10-CM

## 2015-04-14 DIAGNOSIS — Q828 Other specified congenital malformations of skin: Secondary | ICD-10-CM | POA: Diagnosis not present

## 2015-04-14 DIAGNOSIS — B351 Tinea unguium: Secondary | ICD-10-CM | POA: Diagnosis not present

## 2015-04-14 NOTE — Progress Notes (Signed)
She presents today with a chief complaint of painful corns and calluses to the plantar aspect of the bilateral foot as well as elongated toenails.  Objective: Vital signs are stable alert and oriented 3. Pulses are strongly palpable. Her toenails are thick yellow dystrophic onychomycotic with distal clavi and calluses to the plantar aspect of the bilateral forefoot.  Assessment: Porokeratosis with pain in limb secondary to onychomycosis.  Plan: Debridement of all reactive hyperkeratotic tissue debridement of toenails 1 through 5 bilateral. Follow up with [place] in 2 months

## 2015-05-12 DIAGNOSIS — J189 Pneumonia, unspecified organism: Secondary | ICD-10-CM

## 2015-05-12 HISTORY — DX: Pneumonia, unspecified organism: J18.9

## 2015-05-20 ENCOUNTER — Other Ambulatory Visit: Payer: Self-pay | Admitting: Vascular Surgery

## 2015-05-21 ENCOUNTER — Other Ambulatory Visit
Admission: RE | Admit: 2015-05-21 | Discharge: 2015-05-21 | Disposition: A | Discharge status: Home / Self Care | Payer: Medicare Other | Source: Ambulatory Visit | Attending: Vascular Surgery | Admitting: Vascular Surgery

## 2015-05-21 DIAGNOSIS — I739 Peripheral vascular disease, unspecified: Secondary | ICD-10-CM | POA: Insufficient documentation

## 2015-05-21 LAB — CREATININE, SERUM
CREATININE: 0.82 mg/dL (ref 0.44–1.00)
GFR calc Af Amer: >60 mL/min (ref >60)
GFR calc non Af Amer: >60 mL/min (ref >60)

## 2015-05-21 LAB — BUN: BUN: 38 mg/dL — ABNORMAL HIGH (ref 6–20)

## 2015-05-22 ENCOUNTER — Inpatient Hospital Stay
Admission: RE | Admit: 2015-05-22 | Discharge: 2015-05-24 | DRG: 271 | Disposition: A | Discharge status: Home / Self Care | Payer: Medicare Other | Source: Ambulatory Visit | Attending: Vascular Surgery | Admitting: Vascular Surgery

## 2015-05-22 ENCOUNTER — Encounter
Admission: RE | Disposition: A | Discharge status: Home / Self Care | Payer: Self-pay | Source: Ambulatory Visit | Attending: Vascular Surgery

## 2015-05-22 DIAGNOSIS — I10 Essential (primary) hypertension: Secondary | ICD-10-CM | POA: Diagnosis present

## 2015-05-22 DIAGNOSIS — Z87891 Personal history of nicotine dependence: Secondary | ICD-10-CM | POA: Diagnosis not present

## 2015-05-22 DIAGNOSIS — T82868A Thrombosis of vascular prosthetic devices, implants and grafts, initial encounter: Secondary | ICD-10-CM | POA: Diagnosis present

## 2015-05-22 DIAGNOSIS — I890 Lymphedema, not elsewhere classified: Secondary | ICD-10-CM | POA: Diagnosis present

## 2015-05-22 DIAGNOSIS — Z7901 Long term (current) use of anticoagulants: Secondary | ICD-10-CM | POA: Diagnosis not present

## 2015-05-22 DIAGNOSIS — J45909 Unspecified asthma, uncomplicated: Secondary | ICD-10-CM | POA: Diagnosis present

## 2015-05-22 DIAGNOSIS — Z833 Family history of diabetes mellitus: Secondary | ICD-10-CM | POA: Diagnosis not present

## 2015-05-22 DIAGNOSIS — Z885 Allergy status to narcotic agent status: Secondary | ICD-10-CM

## 2015-05-22 DIAGNOSIS — Z7982 Long term (current) use of aspirin: Secondary | ICD-10-CM | POA: Diagnosis not present

## 2015-05-22 DIAGNOSIS — E7800 Pure hypercholesterolemia, unspecified: Secondary | ICD-10-CM | POA: Diagnosis present

## 2015-05-22 DIAGNOSIS — I75022 Atheroembolism of left lower extremity: Secondary | ICD-10-CM | POA: Diagnosis present

## 2015-05-22 DIAGNOSIS — Z800 Family history of malignant neoplasm of digestive organs: Secondary | ICD-10-CM

## 2015-05-22 DIAGNOSIS — G5792 Unspecified mononeuropathy of left lower limb: Secondary | ICD-10-CM | POA: Diagnosis present

## 2015-05-22 DIAGNOSIS — I70322 Atherosclerosis of unspecified type of bypass graft(s) of the extremities with rest pain, left leg: Secondary | ICD-10-CM | POA: Diagnosis present

## 2015-05-22 DIAGNOSIS — J449 Chronic obstructive pulmonary disease, unspecified: Secondary | ICD-10-CM | POA: Diagnosis present

## 2015-05-22 DIAGNOSIS — Y832 Surgical operation with anastomosis, bypass or graft as the cause of abnormal reaction of the patient, or of later complication, without mention of misadventure at the time of the procedure: Secondary | ICD-10-CM | POA: Diagnosis present

## 2015-05-22 DIAGNOSIS — Z836 Family history of other diseases of the respiratory system: Secondary | ICD-10-CM

## 2015-05-22 DIAGNOSIS — Z79899 Other long term (current) drug therapy: Secondary | ICD-10-CM

## 2015-05-22 DIAGNOSIS — Z9981 Dependence on supplemental oxygen: Secondary | ICD-10-CM | POA: Diagnosis not present

## 2015-05-22 DIAGNOSIS — I998 Other disorder of circulatory system: Secondary | ICD-10-CM | POA: Diagnosis present

## 2015-05-22 DIAGNOSIS — Z8249 Family history of ischemic heart disease and other diseases of the circulatory system: Secondary | ICD-10-CM

## 2015-05-22 DIAGNOSIS — Z823 Family history of stroke: Secondary | ICD-10-CM | POA: Diagnosis not present

## 2015-05-22 DIAGNOSIS — K219 Gastro-esophageal reflux disease without esophagitis: Secondary | ICD-10-CM | POA: Diagnosis present

## 2015-05-22 HISTORY — DX: Dependence on supplemental oxygen: Z99.81

## 2015-05-22 HISTORY — PX: PERIPHERAL VASCULAR CATHETERIZATION: SHX172C

## 2015-05-22 HISTORY — DX: Pneumonia, unspecified organism: J18.9

## 2015-05-22 LAB — CBC
HEMATOCRIT: 27.1 % — AB (ref 35.0–47.0)
HEMOGLOBIN: 8.3 g/dL — AB (ref 12.0–16.0)
MCH: 22.4 pg — ABNORMAL LOW (ref 26.0–34.0)
MCHC: 30.4 g/dL — ABNORMAL LOW (ref 32.0–36.0)
MCV: 73.6 fL — ABNORMAL LOW (ref 80.0–100.0)
Platelets: 221 10*3/uL (ref 150–440)
RBC: 3.69 MIL/uL — AB (ref 3.80–5.20)
RDW: 19.3 % — ABNORMAL HIGH (ref 11.5–14.5)
WBC: 5.8 10*3/uL (ref 3.6–11.0)

## 2015-05-22 LAB — MRSA PCR SCREENING: MRSA by PCR: NEGATIVE

## 2015-05-22 LAB — FIBRINOGEN: Fibrinogen: 339 mg/dL (ref 210–470)

## 2015-05-22 LAB — GLUCOSE, CAPILLARY: GLUCOSE-CAPILLARY: 81 mg/dL (ref 65–99)

## 2015-05-22 SURGERY — LOWER EXTREMITY ANGIOGRAPHY
Anesthesia: Moderate Sedation | Site: Leg Lower | Laterality: Left

## 2015-05-22 MED ORDER — SENNOSIDES-DOCUSATE SODIUM 8.6-50 MG PO TABS: 1.0000 | ORAL_TABLET | Freq: Every day | ORAL | Status: DC | PRN

## 2015-05-22 MED ORDER — HEPARIN (PORCINE) IN NACL 100-0.45 UNIT/ML-% IJ SOLN
INTRAMUSCULAR | Status: AC
  Administered 2015-05-22: 600 [IU]/h via INTRAVENOUS
  Filled 2015-05-22: qty 250

## 2015-05-22 MED ORDER — DOCUSATE SODIUM 100 MG PO CAPS
100.0000 mg | ORAL_CAPSULE | Freq: Two times a day (BID) | ORAL | Status: DC
Start: 2015-05-22 — End: 2015-05-24
  Administered 2015-05-22 – 2015-05-24 (×4): 100 mg via ORAL
  Filled 2015-05-22 (×4): qty 1

## 2015-05-22 MED ORDER — PANTOPRAZOLE SODIUM 40 MG PO TBEC
40.0000 mg | DELAYED_RELEASE_TABLET | Freq: Every day | ORAL | Status: DC
  Administered 2015-05-23 – 2015-05-24 (×2): 40 mg via ORAL
  Filled 2015-05-22 (×2): qty 1

## 2015-05-22 MED ORDER — MONTELUKAST SODIUM 10 MG PO TABS
10.0000 mg | ORAL_TABLET | Freq: Every day | ORAL | Status: DC
Start: 2015-05-22 — End: 2015-05-24
  Administered 2015-05-23 – 2015-05-24 (×2): 10 mg via ORAL
  Filled 2015-05-22 (×2): qty 1

## 2015-05-22 MED ORDER — PRAMIPEXOLE DIHYDROCHLORIDE 0.25 MG PO TABS
0.2500 mg | ORAL_TABLET | Freq: Two times a day (BID) | ORAL | Status: DC
  Administered 2015-05-22 – 2015-05-24 (×3): 0.25 mg via ORAL
  Filled 2015-05-22 (×3): qty 1

## 2015-05-22 MED ORDER — RAMELTEON 8 MG PO TABS
8.0000 mg | ORAL_TABLET | Freq: Every day | ORAL | Status: DC
  Administered 2015-05-22 – 2015-05-23 (×2): 8 mg via ORAL
  Filled 2015-05-22 (×3): qty 1

## 2015-05-22 MED ORDER — SODIUM CHLORIDE 0.9 % IV SOLN
0.5000 mg/h | INTRAVENOUS | Status: DC
  Administered 2015-05-23: 0.5 mg/h
  Filled 2015-05-22 (×2): qty 10

## 2015-05-22 MED ORDER — HEPARIN SODIUM (PORCINE) 1000 UNIT/ML IJ SOLN
INTRAMUSCULAR | Status: AC
  Filled 2015-05-22: qty 1

## 2015-05-22 MED ORDER — LIDOCAINE-EPINEPHRINE (PF) 1 %-1:200000 IJ SOLN
INTRAMUSCULAR | Status: AC
  Filled 2015-05-22: qty 30

## 2015-05-22 MED ORDER — HEPARIN SODIUM (PORCINE) 1000 UNIT/ML IJ SOLN
INTRAMUSCULAR | Status: DC | PRN
  Administered 2015-05-22: 3000 [IU] via INTRAVENOUS

## 2015-05-22 MED ORDER — MORPHINE SULFATE (PF) 4 MG/ML IV SOLN
2.0000 mg | INTRAVENOUS | Status: DC | PRN
  Administered 2015-05-22 – 2015-05-23 (×5): 2 mg via INTRAVENOUS
  Filled 2015-05-22 (×5): qty 1

## 2015-05-22 MED ORDER — ONDANSETRON HCL 4 MG/2ML IJ SOLN: 4.0000 mg | Freq: Four times a day (QID) | INTRAMUSCULAR | Status: DC | PRN

## 2015-05-22 MED ORDER — ONDANSETRON HCL 4 MG PO TABS: 4.0000 mg | ORAL_TABLET | Freq: Four times a day (QID) | ORAL | Status: DC | PRN

## 2015-05-22 MED ORDER — FENTANYL CITRATE (PF) 100 MCG/2ML IJ SOLN
INTRAMUSCULAR | Status: DC | PRN
  Administered 2015-05-22 (×2): 50 ug via INTRAVENOUS

## 2015-05-22 MED ORDER — CETYLPYRIDINIUM CHLORIDE 0.05 % MT LIQD
7.0000 mL | Freq: Two times a day (BID) | OROMUCOSAL | Status: DC
  Administered 2015-05-22 – 2015-05-24 (×3): 7 mL via OROMUCOSAL

## 2015-05-22 MED ORDER — ASPIRIN 325 MG PO TABS
325.0000 mg | ORAL_TABLET | Freq: Every day | ORAL | Status: DC
  Administered 2015-05-24: 325 mg via ORAL
  Filled 2015-05-22: qty 1

## 2015-05-22 MED ORDER — HEPARIN (PORCINE) IN NACL 100-0.45 UNIT/ML-% IJ SOLN
600.0000 [IU]/h | INTRAMUSCULAR | Status: DC
  Administered 2015-05-22: 600 [IU]/h via INTRAVENOUS
  Filled 2015-05-22: qty 250

## 2015-05-22 MED ORDER — SODIUM CHLORIDE 0.9% FLUSH
3.0000 mL | Freq: Two times a day (BID) | INTRAVENOUS | Status: DC
  Administered 2015-05-22 – 2015-05-23 (×3): 3 mL via INTRAVENOUS

## 2015-05-22 MED ORDER — IPRATROPIUM-ALBUTEROL 0.5-2.5 (3) MG/3ML IN SOLN
RESPIRATORY_TRACT | Status: AC
  Filled 2015-05-22: qty 3

## 2015-05-22 MED ORDER — ALBUTEROL SULFATE (2.5 MG/3ML) 0.083% IN NEBU
3.0000 mL | INHALATION_SOLUTION | Freq: Two times a day (BID) | RESPIRATORY_TRACT | Status: DC
  Administered 2015-05-22 – 2015-05-24 (×4): 3 mL via RESPIRATORY_TRACT
  Filled 2015-05-22 (×5): qty 3

## 2015-05-22 MED ORDER — DEXTROSE 5 % IV SOLN: 1.5000 g | INTRAVENOUS | Status: DC

## 2015-05-22 MED ORDER — MIDAZOLAM HCL 2 MG/ML PO SYRP
5.0000 mg | ORAL_SOLUTION | Freq: Once | ORAL | Status: AC
  Administered 2015-05-22: 5 mg via ORAL
  Filled 2015-05-22 (×2): qty 4

## 2015-05-22 MED ORDER — ACETAMINOPHEN 325 MG PO TABS: 650.0000 mg | ORAL_TABLET | Freq: Four times a day (QID) | ORAL | Status: DC | PRN

## 2015-05-22 MED ORDER — HEPARIN (PORCINE) IN NACL 2-0.9 UNIT/ML-% IJ SOLN
INTRAMUSCULAR | Status: AC
Start: 2015-05-22 — End: 2015-05-22
  Filled 2015-05-22: qty 1000

## 2015-05-22 MED ORDER — LIDOCAINE-EPINEPHRINE (PF) 1 %-1:200000 IJ SOLN
INTRAMUSCULAR | Status: DC | PRN
  Administered 2015-05-22 (×2): 10 mL via INTRADERMAL

## 2015-05-22 MED ORDER — FENTANYL CITRATE (PF) 100 MCG/2ML IJ SOLN
INTRAMUSCULAR | Status: AC
  Filled 2015-05-22: qty 2

## 2015-05-22 MED ORDER — FUROSEMIDE 20 MG PO TABS
20.0000 mg | ORAL_TABLET | Freq: Two times a day (BID) | ORAL | Status: DC
  Administered 2015-05-23 – 2015-05-24 (×3): 20 mg via ORAL
  Filled 2015-05-22 (×3): qty 1

## 2015-05-22 MED ORDER — SODIUM CHLORIDE 0.9 % IV SOLN
1.0000 mg/h | INTRAVENOUS | Status: AC
  Administered 2015-05-22: 1 mg/h
  Filled 2015-05-22: qty 10

## 2015-05-22 MED ORDER — ACETAMINOPHEN 650 MG RE SUPP: 650.0000 mg | Freq: Four times a day (QID) | RECTAL | Status: DC | PRN

## 2015-05-22 MED ORDER — SODIUM CHLORIDE 0.9 % IV SOLN: INTRAVENOUS | Status: DC

## 2015-05-22 MED ORDER — IPRATROPIUM-ALBUTEROL 0.5-2.5 (3) MG/3ML IN SOLN
3.0000 mL | Freq: Once | RESPIRATORY_TRACT | Status: AC
  Administered 2015-05-22: 3 mL via RESPIRATORY_TRACT

## 2015-05-22 MED ORDER — DILTIAZEM HCL ER 180 MG PO CP24: 180.0000 mg | ORAL_CAPSULE | Freq: Every day | ORAL | Status: DC

## 2015-05-22 MED ORDER — BUDESONIDE 0.25 MG/2ML IN SUSP
0.2500 mg | Freq: Two times a day (BID) | RESPIRATORY_TRACT | Status: DC
  Administered 2015-05-22 – 2015-05-24 (×4): 0.25 mg via RESPIRATORY_TRACT
  Filled 2015-05-22 (×5): qty 2

## 2015-05-22 MED ORDER — GABAPENTIN 300 MG PO CAPS
300.0000 mg | ORAL_CAPSULE | Freq: Two times a day (BID) | ORAL | Status: DC
  Administered 2015-05-22 – 2015-05-24 (×4): 300 mg via ORAL
  Filled 2015-05-22 (×4): qty 1

## 2015-05-22 MED ORDER — ALTEPLASE 30 MG/30 ML FOR INTERV. RAD: 1.0000 mg | INTRA_ARTERIAL | Status: DC | PRN

## 2015-05-22 MED ORDER — MIDAZOLAM HCL 2 MG/2ML IJ SOLN
INTRAMUSCULAR | Status: DC | PRN
  Administered 2015-05-22: 2 mg via INTRAVENOUS
  Administered 2015-05-22: 1 mg via INTRAVENOUS

## 2015-05-22 MED ORDER — ALTEPLASE 2 MG IJ SOLR
INTRAMUSCULAR | Status: DC | PRN
  Administered 2015-05-22: 4 mg

## 2015-05-22 MED ORDER — DILTIAZEM HCL ER COATED BEADS 180 MG PO CP24
180.0000 mg | ORAL_CAPSULE | Freq: Every day | ORAL | Status: DC
  Administered 2015-05-23 – 2015-05-24 (×2): 180 mg via ORAL
  Filled 2015-05-22 (×2): qty 1

## 2015-05-22 MED ORDER — FLEET ENEMA 7-19 GM/118ML RE ENEM: 1.0000 | ENEMA | Freq: Once | RECTAL | Status: DC | PRN

## 2015-05-22 MED ORDER — DEXTROSE-NACL 5-0.9 % IV SOLN
INTRAVENOUS | Status: DC
  Administered 2015-05-22 – 2015-05-23 (×2): via INTRAVENOUS

## 2015-05-22 MED ORDER — POTASSIUM CHLORIDE CRYS ER 20 MEQ PO TBCR
20.0000 meq | EXTENDED_RELEASE_TABLET | Freq: Every day | ORAL | Status: DC
  Administered 2015-05-23 – 2015-05-24 (×2): 20 meq via ORAL
  Filled 2015-05-22 (×2): qty 1

## 2015-05-22 MED ORDER — IOPAMIDOL (ISOVUE-300) INJECTION 61%
INTRAVENOUS | Status: DC | PRN
  Administered 2015-05-22: 35 mL via INTRA_ARTERIAL

## 2015-05-22 MED ORDER — SIMVASTATIN 20 MG PO TABS
20.0000 mg | ORAL_TABLET | Freq: Every day | ORAL | Status: DC
  Administered 2015-05-23 – 2015-05-24 (×2): 20 mg via ORAL
  Filled 2015-05-22 (×2): qty 1

## 2015-05-22 MED ORDER — SORBITOL 70 % SOLN
30.0000 mL | Freq: Every day | Status: DC | PRN
  Filled 2015-05-22: qty 30

## 2015-05-22 MED ORDER — METHYLPREDNISOLONE SODIUM SUCC 125 MG IJ SOLR: 125.0000 mg | INTRAMUSCULAR | Status: DC | PRN

## 2015-05-22 MED ORDER — MAGNESIUM HYDROXIDE 400 MG/5ML PO SUSP: 30.0000 mL | Freq: Every day | ORAL | Status: DC | PRN

## 2015-05-22 MED ORDER — HYDROMORPHONE HCL 1 MG/ML IJ SOLN: 1.0000 mg | Freq: Once | INTRAMUSCULAR | Status: DC

## 2015-05-22 MED ORDER — CHLORHEXIDINE GLUCONATE CLOTH 2 % EX PADS: 6.0000 | MEDICATED_PAD | Freq: Once | CUTANEOUS | Status: DC

## 2015-05-22 MED ORDER — FAMOTIDINE 20 MG PO TABS: 40.0000 mg | ORAL_TABLET | ORAL | Status: DC | PRN

## 2015-05-22 MED ORDER — MIDAZOLAM HCL 5 MG/5ML IJ SOLN
INTRAMUSCULAR | Status: AC
  Filled 2015-05-22: qty 5

## 2015-05-22 SURGICAL SUPPLY — 15 items
CANNULA 5F STIFF (CANNULA) ×1 IMPLANT
CATH INFUS 135CMX50CM (CATHETERS) ×1 IMPLANT
CATH INFUS 90CMX50CM (CATHETERS) ×1
CATH INFUS UNIFUSE 90X50 5FR (CATHETERS) IMPLANT
CATH PIG 70CM (CATHETERS) ×3 IMPLANT
CATH VERT 100CM (CATHETERS) ×1 IMPLANT
GLIDEWIRE ADV .035X180CM (WIRE) ×1 IMPLANT
KIT CATH CVC 3 LUMEN 7FR 8IN (MISCELLANEOUS) ×1 IMPLANT
PACK ANGIOGRAPHY (CUSTOM PROCEDURE TRAY) ×3 IMPLANT
SHEATH BRITE TIP 5FRX11 (SHEATH) ×3 IMPLANT
SHEATH BRITE TIP 6FR X 23 (SHEATH) ×1 IMPLANT
SYR MEDRAD MARK V 150ML (SYRINGE) ×3 IMPLANT
TUBING CONTRAST HIGH PRESS 72 (TUBING) ×3 IMPLANT
WIRE J 3MM .035X145CM (WIRE) ×3 IMPLANT
WIRE MAGIC TORQUE 260C (WIRE) ×1 IMPLANT

## 2015-05-22 NOTE — Progress Notes (Signed)
Pt remains clinically stablel post procedure with placement lysis catheter with heparin/tpa infusions per orders, voiding qs, vss, alert and oriented, husband in earlier and aware of pt to be admitted to ccu ,12. d5ns infusion at 36ml/hr, no bleeding nor hematoma at right groin site,  Report called to unit care nurse ccu12,orders and poc reviewed.

## 2015-05-22 NOTE — Op Note (Signed)
[facility]   PROCEDURE NOTE  PROCEDURE:  1. Right IJ central venous catheter placement 2. Right IJ cannulation under ultrasound guidance  PRE-OPERATIVE DIAGNOSIS: ischemic leg  POST-OPERATIVE DIAGNOSIS: same as above  SURGEON: [name] [name], MD  ANESTHESIA:  None  ESTIMATED BLOOD LOSS: minimal  FINDING(S): none  SPECIMEN(S):  none  INDICATIONS:   KYSHA PATMAN is a 82 y.o. female who presents with need for venous access.  The patient presents for central venous catheter placement.  The patient is aware the risks of central venous catheter placement include but are not limited to: bleeding, infection, central venous injury, pneumothorax, possible venous stenosis, possible malpositioning in the venous system, and possible infections related to long-term catheter presence. The patient was aware of these risks and agreed to proceed.  DESCRIPTION: After written informed consent was obtained from the patient and/or family, the patient was placed supine in the hospital bed.  The patient was prepped with chloraprep and draped in the standard fashion for a chest or neck central venous catheter placement.  I anesthesized the neck cannulation site with 1% lidocaine then under ultrasound guidance, the right internal jugular vein was cannulated with the 18 gauge needle.  A J wire was then placed down in the superior vena cava.  After a skin nick and dilatation, the triple lumen central venous catheter was placed over the wire and the wire was removed.  Each port was aspirated and flushed with sterile normal saline.  The catheter was secured in placed with three interrupted stitches of 3-0 Silk tied to the catheter.  The catheter was dressed with sterile dressing.  Fluoroscopy was used to park the catheter tip at the cavoatrial junction   COMPLICATIONS: none apparent  CONDITION: stable  Jlee Harkless 05/22/2015, 3:24 PM

## 2015-05-22 NOTE — H&P (Signed)
  [facility] History & Physical Update  The patient was interviewed and re-examined.  The patient's previous History and Physical has been reviewed and is unchanged.  There is no change in the plan of care. We plan to proceed with the scheduled procedure.  Natasha Castrillo, MD  05/22/2015, 10:15 AM

## 2015-05-22 NOTE — Op Note (Addendum)
[facility] Percutaneous Study/Intervention Procedural Note   Date of Surgery: 05/22/2015  Surgeon(s):Jozlin Bently   Assistants:none  Pre-operative Diagnosis: PAD with rest pain LLE, occlusion of her bypass  Post-operative diagnosis: Same  Procedure(s) Performed: 1. Ultrasound guidance for vascular access right femoral artery 2. Catheter placement into left AT artery 3. Aortogram and selective left lower extremity angiogram 4. Infusion for thrombolysis with 4 mg of tpa delivered through an infusion catheter to the left leg bypass and AT artery 5. Placement of a 135 cm total length, 50 cm working length catheter for continued thrombolysis into the left leg bypass and AT artery  EBL: 25 cc  Contrast: 35 cc  Fluoro Time: 5.4 minutes  Moderate Conscious Sedation Time: approximately 40 minutes using 3 mg of Versed and 100 mcg of Fentanyl  Indications: Patient is a 82 y.o.female with an ischemic leg with rest pain. The patient has noninvasive study showing occlusion of her bypass with poor runoff. The patient is brought in for angiography for further evaluation and potential treatment. Risks and benefits are discussed and informed consent is obtained  Procedure: The patient was identified and appropriate procedural time out was performed. The patient was then placed supine on the table and prepped and draped in the usual sterile fashion.Moderate conscious sedation was administered during a face to face encounter with the patient throughout the procedure with my supervision of the RN administering medicines and monitoring the patient's vital signs, pulse oximetry, telemetry and mental status throughout from the start of the procedure until the patient was taken to the recovery room. Ultrasound was used to evaluate the right common femoral artery. It was patent . A digital ultrasound  image was acquired. A Seldinger needle was used to access the right common femoral artery under direct ultrasound guidance and a permanent image was performed. A 0.035 J wire was advanced without resistance and a 5Fr sheath was placed. Pigtail catheter was placed into the aorta and an AP aortogram was performed. This demonstrated normal renal arteries and normal aorta and iliac segments without significant stenosis. I then crossed the aortic bifurcation and advanced to the left femoral head. Selective left lower extremity angiogram was then performed. This demonstrated occlusion of the SFA and the femoral-distal bypass. Profunda femorus artery was patent. The patient was systemically heparinized and a 6 French sheath was then placed over the [employer] wire. I then used a Kumpe catheter and the advantage wire to navigate into and through the occluded bypass and then to cross the distal anastomosis and confirm intraluminal flow in what appeared to be the AT artery. Over a long Magic Tourque wire I placed a 135 cm total length 50 cm working length infusion catheter throughout the entire bypass and into the proximal AT artery.  I then instilled 4 mg of tpa through the infusion catheter to treat the thrombosed bypass graft and distal runoff.  It was then secured in place with a Prolene suture for continued thrombolysis and the sheath was sutured into place as well. I elected to terminate the procedure. The patient was taken to the recovery room in stable condition having tolerated the procedure well.  Findings:  Aortogram: normal renal arteries, normal aorta and iliac arteries without significant stenosis Left Lower Extremity: occlusion of the SFA and the femoral-distal bypass. Profunda femorus artery was patent.   Disposition: Patient was taken to the recovery room in stable condition having tolerated the procedure well.  Complications: None  Kurt Azimi 05/22/2015 3:25  PM

## 2015-05-23 ENCOUNTER — Ambulatory Visit: Admission: RE | Admit: 2015-05-23 | Payer: Medicare Other | Source: Ambulatory Visit | Admitting: Vascular Surgery

## 2015-05-23 ENCOUNTER — Encounter
Admission: RE | Disposition: A | Discharge status: Home / Self Care | Payer: Self-pay | Source: Ambulatory Visit | Attending: Vascular Surgery

## 2015-05-23 HISTORY — PX: PERIPHERAL VASCULAR CATHETERIZATION: SHX172C

## 2015-05-23 LAB — BASIC METABOLIC PANEL
Anion gap: 1 — ABNORMAL LOW (ref 5–15)
BUN: 18 mg/dL (ref 6–20)
CHLORIDE: 104 mmol/L (ref 101–111)
CO2: 33 mmol/L — ABNORMAL HIGH (ref 22–32)
Calcium: 8.6 mg/dL — ABNORMAL LOW (ref 8.9–10.3)
Creatinine, Ser: 0.67 mg/dL (ref 0.44–1.00)
GFR calc Af Amer: >60 mL/min (ref >60)
GFR calc non Af Amer: >60 mL/min (ref >60)
GLUCOSE: 98 mg/dL (ref 65–99)
POTASSIUM: 3.9 mmol/L (ref 3.5–5.1)
Sodium: 138 mmol/L (ref 135–145)

## 2015-05-23 LAB — CBC
HEMATOCRIT: 25.6 % — AB (ref 35.0–47.0)
HEMATOCRIT: 25.4 % — AB (ref 35.0–47.0)
HEMOGLOBIN: 7.9 g/dL — AB (ref 12.0–16.0)
Hemoglobin: 8.1 g/dL — ABNORMAL LOW (ref 12.0–16.0)
MCH: 22.4 pg — ABNORMAL LOW (ref 26.0–34.0)
MCH: 22.4 pg — ABNORMAL LOW (ref 26.0–34.0)
MCHC: 31.6 g/dL — AB (ref 32.0–36.0)
MCHC: 30.9 g/dL — AB (ref 32.0–36.0)
MCV: 71 fL — AB (ref 80.0–100.0)
MCV: 72.6 fL — ABNORMAL LOW (ref 80.0–100.0)
Platelets: 204 10*3/uL (ref 150–440)
Platelets: 211 10*3/uL (ref 150–440)
RBC: 3.6 MIL/uL — ABNORMAL LOW (ref 3.80–5.20)
RBC: 3.51 MIL/uL — ABNORMAL LOW (ref 3.80–5.20)
RDW: 19 % — AB (ref 11.5–14.5)
RDW: 19 % — ABNORMAL HIGH (ref 11.5–14.5)
WBC: 5.8 10*3/uL (ref 3.6–11.0)
WBC: 7.9 10*3/uL (ref 3.6–11.0)

## 2015-05-23 LAB — FIBRINOGEN
FIBRINOGEN: 253 mg/dL (ref 210–470)
Fibrinogen: 223 mg/dL (ref 210–470)

## 2015-05-23 SURGERY — LOWER EXTREMITY ANGIOGRAPHY
Anesthesia: Moderate Sedation | Site: Leg Lower | Laterality: Left

## 2015-05-23 SURGERY — LOWER EXTREMITY ANGIOGRAPHY
Anesthesia: Moderate Sedation | Laterality: Left

## 2015-05-23 MED ORDER — LIDOCAINE-EPINEPHRINE (PF) 1 %-1:200000 IJ SOLN
INTRAMUSCULAR | Status: AC
  Filled 2015-05-23: qty 30

## 2015-05-23 MED ORDER — FENTANYL CITRATE (PF) 100 MCG/2ML IJ SOLN
INTRAMUSCULAR | Status: DC | PRN
  Administered 2015-05-23 (×3): 50 ug via INTRAVENOUS

## 2015-05-23 MED ORDER — TIROFIBAN HCL IN NACL 5-0.9 MG/100ML-% IV SOLN
INTRAVENOUS | Status: AC
  Filled 2015-05-23: qty 100

## 2015-05-23 MED ORDER — FENTANYL CITRATE (PF) 100 MCG/2ML IJ SOLN
INTRAMUSCULAR | Status: AC
  Filled 2015-05-23: qty 4

## 2015-05-23 MED ORDER — IOPAMIDOL (ISOVUE-300) INJECTION 61%
INTRAVENOUS | Status: DC | PRN
  Administered 2015-05-23: 40 mL via INTRA_ARTERIAL

## 2015-05-23 MED ORDER — HEPARIN SODIUM (PORCINE) 1000 UNIT/ML IJ SOLN
INTRAMUSCULAR | Status: AC
  Filled 2015-05-23: qty 1

## 2015-05-23 MED ORDER — MORPHINE SULFATE (PF) 4 MG/ML IV SOLN
2.0000 mg | INTRAVENOUS | Status: DC | PRN
  Administered 2015-05-23: 2 mg via INTRAVENOUS
  Filled 2015-05-23: qty 1

## 2015-05-23 MED ORDER — GUAIFENESIN 100 MG/5ML PO SOLN
5.0000 mL | ORAL | Status: DC | PRN
  Administered 2015-05-23 – 2015-05-24 (×3): 100 mg via ORAL
  Filled 2015-05-23 (×5): qty 5

## 2015-05-23 MED ORDER — HEPARIN SODIUM (PORCINE) 1000 UNIT/ML IJ SOLN
INTRAMUSCULAR | Status: DC | PRN
  Administered 2015-05-23: 3000 [IU] via INTRAVENOUS

## 2015-05-23 MED ORDER — NITROGLYCERIN 5 MG/ML IV SOLN
INTRAVENOUS | Status: DC | PRN
  Administered 2015-05-23: 250 ug via INTRAVENOUS

## 2015-05-23 MED ORDER — MIDAZOLAM HCL 2 MG/2ML IJ SOLN
INTRAMUSCULAR | Status: DC | PRN
  Administered 2015-05-23: 1 mg via INTRAVENOUS
  Administered 2015-05-23: 2 mg via INTRAVENOUS

## 2015-05-23 MED ORDER — CEFUROXIME SODIUM 1.5 G IJ SOLR
INTRAMUSCULAR | Status: AC
  Filled 2015-05-23: qty 1.5

## 2015-05-23 MED ORDER — NITROGLYCERIN 5 MG/ML IV SOLN
INTRAVENOUS | Status: AC
  Filled 2015-05-23: qty 10

## 2015-05-23 MED ORDER — MIDAZOLAM HCL 5 MG/5ML IJ SOLN
INTRAMUSCULAR | Status: AC
  Filled 2015-05-23: qty 5

## 2015-05-23 MED ORDER — HYDROMORPHONE HCL 2 MG PO TABS: 1.0000 mg | ORAL_TABLET | Freq: Four times a day (QID) | ORAL | Status: DC | PRN

## 2015-05-23 MED ORDER — TIROFIBAN HCL IN NACL 5-0.9 MG/100ML-% IV SOLN
0.0750 ug/kg/min | INTRAVENOUS | Status: DC
  Administered 2015-05-23: 0.075 ug/kg/min via INTRAVENOUS
  Filled 2015-05-23: qty 100

## 2015-05-23 MED ORDER — ALBUTEROL SULFATE (5 MG/ML) 0.5% IN NEBU: 2.5000 mg | INHALATION_SOLUTION | Freq: Four times a day (QID) | RESPIRATORY_TRACT | Status: DC | PRN

## 2015-05-23 MED ORDER — HEPARIN (PORCINE) IN NACL 2-0.9 UNIT/ML-% IJ SOLN
INTRAMUSCULAR | Status: AC
  Filled 2015-05-23: qty 1000

## 2015-05-23 SURGICAL SUPPLY — 13 items
BALLN LUTONIX DCB 4X80X130 (BALLOONS) ×4
BALLOON LUTONIX DCB 4X80X130 (BALLOONS) IMPLANT
CANISTER PENUMBRA MAX (MISCELLANEOUS) ×2 IMPLANT
CATH CXI SUPP ANG 4FR 135 (MICROCATHETER) IMPLANT
CATH CXI SUPP ANG 4FR 135CM (MICROCATHETER) ×4
CATH INDIGO 6 ST-TIP 135CM (CATHETERS) ×2 IMPLANT
DEVICE PRESTO INFLATION (MISCELLANEOUS) ×2 IMPLANT
DEVICE STARCLOSE SE CLOSURE (Vascular Products) ×2 IMPLANT
PACK ANGIOGRAPHY (CUSTOM PROCEDURE TRAY) ×4 IMPLANT
SHEATH BRITE TIP 8FRX11 (SHEATH) ×2 IMPLANT
SHEATH HIGHFLEX ANSEL 6FRX55 (SHEATH) ×2 IMPLANT
TUBING ASPIRATION INDIGO (MISCELLANEOUS) ×2 IMPLANT
WIRE MAGIC TORQUE 260C (WIRE) ×2 IMPLANT

## 2015-05-23 NOTE — Progress Notes (Signed)
Pt returned from [place].  resp even and unlabored.  Neuro intact.  Pt alert and oriented.  NAD noted.

## 2015-05-23 NOTE — Op Note (Signed)
 [facility] Percutaneous Study/Intervention Procedural Note   Date of Surgery: 05/23/2015  Surgeon(s):DEW,JASON   Assistants:none  Pre-operative Diagnosis: PAD with rest pain and occluded femoral to distal bypass status post overnight lytic therapy  Post-operative diagnosis: Same  Procedure(s) Performed:  1. Catheter placement into left anterior tibial artery from right femoral approach 2. selective left lower extremity angiogram 3. Mechanical thrombectomy with the penumbra cat-6 device to the femoral to anterior tibial artery bypass graft, distal anastomosis, and proximal anterior tibial artery 4. Percutaneous transluminal angioplasty of the distal anastomosis and proximal anterior tibial artery with 4 mm diameter by 8 cm length Lutonix drug-coated angioplasty balloon  5.  StarClose closure device right femoral artery  EBL: 400 cc  Contrast: 40 cc  Fluoro Time: 6.5 minutes  Moderate Conscious Sedation Time: approximately 40 minutes using 3 mg of Versed and 150 mcg of Fentanyl  Indications: Patient is a 82 y.o.female with rest pain and occluded femoral to distal bypass graft. The patient has noninvasive study showing an occluded graft and she was brought in yesterday for the initiation of lytic therapy. The patient is brought in for angiography for further evaluation and potential treatment. Risks and benefits are discussed and informed consent is obtained  Procedure: The patient was identified and appropriate procedural time out was performed. The patient was then placed supine on the table and prepped and draped in the usual sterile fashion.Moderate conscious sedation was administered during a face to face encounter with the patient throughout the procedure with my supervision of the RN administering medicines and monitoring the patient's vital signs, pulse oximetry, telemetry  and mental status throughout from the start of the procedure until the patient was taken to the recovery room. The existing lysis catheter was removed as was the 23 cm sheath and I replaced a 6 French Hyperflex Ansell sheath into the left common femoral artery and the proximal portion of the bypass graft and imaging was performed.Partially occluding thrombus within the femoral to anterior tibial artery bypass graft. High-grade stenosis at the distal anastomosis with thrombosis of the distal anastomosis and proximal anterior tibial artery. I then used the penumbra cat 6 device to perform mechanical thrombectomy of the bypass graft, the distal anastomosis, and the proximal anterior tibial artery. This resulted in significant improvement but a high-grade residual stenosis was identified at the distal anastomosis. I elected to treat this with a 4 mm diameter by 8 cm length Lutonix drug-coated angioplasty balloon. This resulted in less than 30% residual stenosis in this location. There was significant residual spasm in the anterior tibial artery which was treated with intra-arterial nitroglycerin. This resulted in in-line flow in the foot although some spasm still remained. I did not feel there is anything else we can do to improve her perfusion for a percutaneous standpoint and it now should be adequate to relieve her pain.  I elected to terminate the procedure. The sheath was removed and StarClose closure device was deployed in the right femoral artery with excellent hemostatic result. The patient was taken to the recovery room in stable condition having tolerated the procedure well.  Findings:   Left Lower Extremity: Partially occluding thrombus within the femoral to anterior tibial artery bypass graft. High-grade stenosis at the distal anastomosis with thrombosis of the distal anastomosis and proximal anterior tibial artery.   Disposition: Patient was taken to the recovery room in stable  condition having tolerated the procedure well.  Complications: None  DEW,JASON 05/23/2015 9:03 AM

## 2015-05-23 NOTE — H&P (Signed)
  [facility] History & Physical Update  The patient was interviewed and re-examined.  The patient's previous History and Physical has been reviewed and is unchanged.  There is no change in the plan of care. We plan to proceed with the scheduled procedure.  DEW,JASON, MD  05/23/2015, 8:00 AM

## 2015-05-23 NOTE — Progress Notes (Signed)
Pt care assumed, pt transported to specials for angio

## 2015-05-23 NOTE — Progress Notes (Signed)
[facility]  Daily Progress Note  Subjective: Day of Surgery: Catheter placement into left anterior tibial artery from right femoral approach, selective left lower extremity angiogram, Mechanical thrombectomy with the penumbra cat-6 device to the femoral to anterior tibial artery bypass graft, distal anastomosis, and proximal anterior tibial artery, Percutaneous transluminal angioplasty of the distal anastomosis and proximal anterior tibial artery with 4 mm diameter by 8 cm length Lutonix drug-coated angioplasty balloon, StarClose closure device right femoral artery  POD#1: Ultrasound guidance for vascular access right femoral artery, Catheter placement into left AT artery, Aortogram and selective left lower extremity angiogram, Infusion for thrombolysis with 4 mg of tpa delivered through an infusion catheter to the left leg bypass and AT artery, Placement of a 135 cm total length, 50 cm working length catheter for continued thrombolysis into the left leg bypass and AT artery.   Patient admitted yesterday with thrombosed left lower extremity bypass graft. Had TPA lysis overnight taken back this AM for mechanical thrombectomy and angioplasty of bypass. Bypass now open.  Patient resting comfortably in bed. Without complaint. Pain in left lower extremity has improved. On Aggrastat overnight.   Objective: Filed Vitals:   05/23/15 0742 05/23/15 0917 05/23/15 0925 05/23/15 0944  BP: 189/73 111/53 125/57 131/78  Pulse: 72 97 96 95  Temp: 98.3 F (36.8 C)     TempSrc: Oral     Resp: 16 23 11 15   Height:      Weight:      SpO2: 96% 93% 93% 99%    Intake/Output Summary (Last 24 hours) at 05/23/15 1128 Last data filed at 05/23/15 1100  Gross per 24 hour  Intake 1503.75 ml  Output   1875 ml  Net -371.25 ml   Physical Exam: A&Ox3, NAD Neck: Right IJ - clean, dry and intact. No drainage or swelling noted.  CV: RRR Pulmonary: CTA Bilaterally Abdomen: Soft, Nontender,  Nondistended Groin: Pressure device in place - no swelling or drainage noted.  Vascular: Left lower extremity - warm and non-tender   Laboratory: CBC    Component Value Date/Time   WBC 7.9 05/23/2015 1018   WBC 7.6 01/28/2014 0437   WBC 5.9 04/19/2013 1652   HGB 7.9* 05/23/2015 1018   HGB 9.3* 01/28/2014 0437   HCT 25.4* 05/23/2015 1018   HCT 29.2* 01/28/2014 0437   PLT 211 05/23/2015 1018   PLT 255 01/28/2014 0437   BMET    Component Value Date/Time   NA 138 05/23/2015 0322   NA 140 01/28/2014 0437   NA 139 04/19/2013 1652   K 3.9 05/23/2015 0322   K 3.3* 01/28/2014 0437   CL 104 05/23/2015 0322   CL 106 01/28/2014 0437   CO2 33* 05/23/2015 0322   CO2 25 01/28/2014 0437   GLUCOSE 98 05/23/2015 0322   GLUCOSE 87 01/28/2014 0437   GLUCOSE 110* 04/19/2013 1652   BUN 18 05/23/2015 0322   BUN 21* 05/08/2014 1052   BUN 21 04/19/2013 1652   CREATININE 0.67 05/23/2015 0322   CREATININE 0.74 05/08/2014 1052   CALCIUM 8.6* 05/23/2015 0322   CALCIUM 9.2 01/28/2014 0437   GFRNONAA >60 05/23/2015 0322   GFRNONAA >60 05/08/2014 1052   GFRNONAA >60 01/28/2014 0437   GFRAA >60 05/23/2015 0322   GFRAA >60 05/08/2014 1052   GFRAA >60 01/28/2014 0437   Assessment/Planning: 82 year old female with admitted with thrombosis of left extremity bypass graft s/p TPA and mechanical lysis, bypass now open - stable 1)  Aggrestat overnight 2) Pain control  3) Bedrest 4) Possible d/c home tomorrow  Marcelle Overlie PA-C 05/23/2015 11:28 AM

## 2015-05-24 ENCOUNTER — Encounter: Payer: Self-pay | Admitting: Vascular Surgery

## 2015-05-24 LAB — CBC
HCT: 21.9 % — ABNORMAL LOW (ref 35.0–47.0)
HEMOGLOBIN: 6.9 g/dL — AB (ref 12.0–16.0)
MCH: 22.6 pg — ABNORMAL LOW (ref 26.0–34.0)
MCHC: 31.4 g/dL — ABNORMAL LOW (ref 32.0–36.0)
MCV: 71.9 fL — ABNORMAL LOW (ref 80.0–100.0)
Platelets: 182 10*3/uL (ref 150–440)
RBC: 3.04 MIL/uL — AB (ref 3.80–5.20)
RDW: 19.2 % — ABNORMAL HIGH (ref 11.5–14.5)
WBC: 7.5 10*3/uL (ref 3.6–11.0)

## 2015-05-24 LAB — MAGNESIUM: MAGNESIUM: 1.7 mg/dL (ref 1.7–2.4)

## 2015-05-24 LAB — BASIC METABOLIC PANEL
Anion gap: 4 — ABNORMAL LOW (ref 5–15)
BUN: 15 mg/dL (ref 6–20)
CHLORIDE: 104 mmol/L (ref 101–111)
CO2: 30 mmol/L (ref 22–32)
Calcium: 8.3 mg/dL — ABNORMAL LOW (ref 8.9–10.3)
Creatinine, Ser: 0.65 mg/dL (ref 0.44–1.00)
GFR calc non Af Amer: >60 mL/min (ref >60)
Glucose, Bld: 100 mg/dL — ABNORMAL HIGH (ref 65–99)
POTASSIUM: 3.9 mmol/L (ref 3.5–5.1)
SODIUM: 138 mmol/L (ref 135–145)

## 2015-05-24 MED ORDER — FERROUS SULFATE 325 (65 FE) MG PO TABS: 325.0000 mg | ORAL_TABLET | Freq: Every day | ORAL | Status: DC

## 2015-05-24 MED ORDER — HYDROMORPHONE HCL 2 MG PO TABS: 1.0000 mg | ORAL_TABLET | Freq: Four times a day (QID) | ORAL | Status: DC | PRN

## 2015-05-24 MED ORDER — FERROUS SULFATE 325 (65 FE) MG PO TABS
325.0000 mg | ORAL_TABLET | Freq: Every day | ORAL | Status: DC
Start: 2015-05-24 — End: 2016-03-31

## 2015-05-24 NOTE — Progress Notes (Signed)
[facility]  Daily Progress Note   Subjective: POD #1: Catheter placement into left anterior tibial artery from right femoral approach, selective left lower extremity angiogram, Mechanical thrombectomy with the penumbra cat-6 device to the femoral to anterior tibial artery bypass graft, distal anastomosis, and proximal anterior tibial artery, Percutaneous transluminal angioplasty of the distal anastomosis and proximal anterior tibial artery with 4 mm diameter by 8 cm length Lutonix drug-coated angioplasty balloon, StarClose closure device right femoral artery  POD#2: Ultrasound guidance for vascular access right femoral artery, Catheter placement into left AT artery, Aortogram and selective left lower extremity angiogram, Infusion for thrombolysis with 4 mg of tpa delivered through an infusion catheter to the left leg bypass and AT artery, Placement of a 135 cm total length, 50 cm working length catheter for continued thrombolysis into the left leg bypass and AT artery.   Patient sitting up in bed comfortably. She is without complaint this AM. Minimal pain from her lower left extremity. Used commode this AM. Asking to go home. Aggrastat overnight - stopped this AM.   Objective: Filed Vitals:   05/24/15 0800 05/24/15 0900 05/24/15 0929 05/24/15 0930  BP: 108/67 147/54  147/54  Pulse: 75 87  93  Temp:      TempSrc:      Resp: 15 21  16   Height:      Weight:      SpO2: 96% 91% 91% 98%    Intake/Output Summary (Last 24 hours) at 05/24/15 1130 Last data filed at 05/24/15 0958  Gross per 24 hour  Intake 121.44 ml  Output   2300 ml  Net -2178.56 ml   Physical Exam: A&Ox3, NAD Neck: Right IJ - clean, dry and intact. No drainage or swelling noted.  CV: RRR Pulmonary: CTA Bilaterally Abdomen: Soft, Nontender, Nondistended Groin: Pressure device removed - no swelling or drainage noted.  Vascular: Left lower extremity - warm and non-tender   Laboratory: CBC     Component Value Date/Time   WBC 7.5 05/24/2015 0450   WBC 7.6 01/28/2014 0437   WBC 5.9 04/19/2013 1652   HGB 6.9* 05/24/2015 0450   HGB 9.3* 01/28/2014 0437   HCT 21.9* 05/24/2015 0450   HCT 29.2* 01/28/2014 0437   PLT 182 05/24/2015 0450   PLT 255 01/28/2014 0437   BMET    Component Value Date/Time   NA 138 05/24/2015 0450   NA 140 01/28/2014 0437   NA 139 04/19/2013 1652   K 3.9 05/24/2015 0450   K 3.3* 01/28/2014 0437   CL 104 05/24/2015 0450   CL 106 01/28/2014 0437   CO2 30 05/24/2015 0450   CO2 25 01/28/2014 0437   GLUCOSE 100* 05/24/2015 0450   GLUCOSE 87 01/28/2014 0437   GLUCOSE 110* 04/19/2013 1652   BUN 15 05/24/2015 0450   BUN 21* 05/08/2014 1052   BUN 21 04/19/2013 1652   CREATININE 0.65 05/24/2015 0450   CREATININE 0.74 05/08/2014 1052   CALCIUM 8.3* 05/24/2015 0450   CALCIUM 9.2 01/28/2014 0437   GFRNONAA >60 05/24/2015 0450   GFRNONAA >60 05/08/2014 1052   GFRNONAA >60 01/28/2014 0437   GFRAA >60 05/24/2015 0450   GFRAA >60 05/08/2014 1052   GFRAA >60 01/28/2014 0437   Assessment/Planning: 82 year old female with admitted with thrombosis of left extremity bypass graft s/p TPA and mechanical lysis, bypass now open - stable 1) Hgb decrease - most likely from removal of over 400cc of thrombus yesterday. 2) Iron prescribed on  discharge 3) OK to discharge home 4) Discussed with Dr. Ellis Parents [facility] PA-C 05/24/2015 11:30 AM

## 2015-05-24 NOTE — Progress Notes (Signed)
Pt discharged at this time.  RIJ TLC removed, three stitches removed.  Cath pulled, intact.  Pressure held by this RN for 5 minutes.  No bleeding noted.  Site covered with clean dry dressing.

## 2015-05-24 NOTE — Discharge Summary (Signed)
[place]    Discharge Summary    Patient ID:  AVIANCE [name] MRN: [id number] DOB/AGE: 80-Jan-1930 80 y.o.  Admit date: 05/22/2015 Discharge date: 05/24/2015 Date of Surgery: 05/23/2015 Surgeon: Surgeon(s): Algernon Huxley, MD  Admission Diagnosis: LT lower extremity angio    Occluded graft ischemic leg  Discharge Diagnoses:  LT lower extremity angio    Occluded graft ischemic leg  Secondary Diagnoses: Past Medical History  Diagnosis Date  . GERD (gastroesophageal reflux disease)   . Asthma   . Anemia   . Arthritis   . H/O blood clots 2014  . Gallstones 2015  . Hypertension   . History of home oxygen therapy     at night  . Bilateral pneumonia may 2017   Procedure(s): Lower Extremity Angiography Lower Extremity Intervention  Discharged Condition: good  HPI:  Patient is a 82 y.o.female with an ischemic leg with rest pain. The patient has noninvasive study showing occlusion of her bypass with poor runoff. The patient is brought in for angiography for further evaluation and potential treatment.   On 05/22/15, the patient underwent a Ultrasound guidance for vascular access right femoral artery, Catheter placement into left AT artery, Aortogram and selective left lower extremity angiogram, Infusion for thrombolysis with 4 mg of tpa delivered through an infusion catheter to the left leg bypass and AT artery, Placement of a 135 cm total length, 50 cm working length catheter for continued thrombolysis into the left leg bypass and AT artery. She was treated with TPA and heparin overnight and brought back to the endovascular suite the next day on 05/23/15 and underwent a Catheter placement into left anterior tibial artery from right femoral approach, selective left lower extremity angiogram, Mechanical thrombectomy with the penumbra cat-6 device to the femoral to anterior tibial artery bypass graft, distal anastomosis, and proximal anterior tibial artery,  Percutaneous transluminal angioplasty of the distal anastomosis and proximal anterior tibial artery with 4 mm diameter by 8 cm length Lutonix drug-coated angioplasty balloon, StarClose closure device right femoral artery. She tolerated the procedure well and was transferred back to the ICU for Aggrastat infusion overnight. At the end of the second procedure the patients bypass was open and revascularization of the leg was achieved.   Hospital Course:  Natasha [name] is a 82 y.o. female is S/P Left Procedure(s): Lower Extremity Angiography Lower Extremity Intervention  Extubated: POD # 0  Physical exam:  A&Ox3, NAD Neck: Right IJ - clean, dry and intact. No drainage or swelling noted.  CV: RRR Pulmonary: CTA Bilaterally Abdomen: Soft, Nontender, Nondistended Groin: Pressure device removed - no swelling or drainage noted.  Vascular: Left lower extremity - warm and non-tender  Post-op wounds clean, dry, intact or healing well  Pt. Ambulating, voiding and taking PO diet without difficulty.  Pt pain controlled with PO pain meds.  Labs as below  Complications:none  Consults:   None  Significant Diagnostic Studies: CBC Lab Results  Component Value Date   WBC 7.5 05/24/2015   HGB 6.9* 05/24/2015   HCT 21.9* 05/24/2015   MCV 71.9* 05/24/2015   PLT 182 05/24/2015   BMET    Component Value Date/Time   NA 138 05/24/2015 0450   NA 140 01/28/2014 0437   NA 139 04/19/2013 1652   K 3.9 05/24/2015 0450   K 3.3* 01/28/2014 0437   CL 104 05/24/2015 0450   CL 106 01/28/2014 0437   CO2 30 05/24/2015 0450   CO2 25 01/28/2014 0437  GLUCOSE 100* 05/24/2015 0450   GLUCOSE 87 01/28/2014 0437   GLUCOSE 110* 04/19/2013 1652   BUN 15 05/24/2015 0450   BUN 21* 05/08/2014 1052   BUN 21 04/19/2013 1652   CREATININE 0.65 05/24/2015 0450   CREATININE 0.74 05/08/2014 1052   CALCIUM 8.3* 05/24/2015 0450   CALCIUM 9.2 01/28/2014 0437   GFRNONAA >60 05/24/2015 0450   GFRNONAA  >60 05/08/2014 1052   GFRNONAA >60 01/28/2014 0437   GFRAA >60 05/24/2015 0450   GFRAA >60 05/08/2014 1052   GFRAA >60 01/28/2014 0437   COAG Lab Results  Component Value Date   INR 1.65 03/11/2015   INR 1.96 02/27/2015   INR 1.1 05/09/2013   Disposition:  Discharge to :Home    Medication List    STOP taking these medications        diltiazem 180 MG 24 hr capsule  Commonly known as:  DILACOR XR      TAKE these medications        albuterol 108 (90 Base) MCG/ACT inhaler  Commonly known as:  PROVENTIL HFA;VENTOLIN HFA  Inhale 2 puffs into the lungs 2 (two) times daily.     aspirin 325 MG tablet  Take 325 mg by mouth daily.     budesonide 180 MCG/ACT inhaler  Commonly known as:  PULMICORT  Inhale 2 puffs into the lungs 2 (two) times daily.     CALCIUM 1200+D3 PO  Take 1 tablet by mouth daily.     cilostazol 100 MG tablet  Commonly known as:  PLETAL  Take 200 mg by mouth 2 (two) times daily. Reported on 05/22/2015     diltiazem 180 MG 24 hr capsule  Commonly known as:  CARDIZEM CD  TAKE 1 CAPSULE (180 MG TOTAL) BY MOUTH ONCE DAILY.     esomeprazole 20 MG capsule  Commonly known as:  NEXIUM  Take 20 mg by mouth daily at 12 noon.     ferrous sulfate 325 (65 FE) MG tablet  Take 1 tablet (325 mg total) by mouth daily.     furosemide 20 MG tablet  Commonly known as:  LASIX  Take 20 mg by mouth 2 (two) times daily.     gabapentin 300 MG capsule  Commonly known as:  NEURONTIN  Take 300 mg by mouth 2 (two) times daily.     HYDROmorphone 2 MG tablet  Commonly known as:  DILAUDID  Take 0.5 tablets (1 mg total) by mouth every 6 (six) hours as needed for moderate pain.     KLOR-CON M20 20 MEQ tablet  Generic drug:  potassium chloride SA     levofloxacin 500 MG tablet  Commonly known as:  LEVAQUIN  Take 1 tablet (500 mg total) by mouth daily.     Melatonin 5 MG Tabs  Take 5-10 mg by mouth at bedtime as needed (for sleep). Reported on 05/22/2015      montelukast 10 MG tablet  Commonly known as:  SINGULAIR  Take 10 mg by mouth daily.     omeprazole 20 MG capsule  Commonly known as:  PRILOSEC  Take 20 mg by mouth at bedtime. Reported on 05/22/2015     pramipexole 0.25 MG tablet  Commonly known as:  MIRAPEX  Take 0.25 mg by mouth 2 (two) times daily.     rivaroxaban 20 MG Tabs tablet  Commonly known as:  XARELTO  Take 20 mg by mouth daily with supper.     senna-docusate 8.6-50 MG tablet  Commonly  known as:  Senokot-S  Take 1-2 tablets by mouth daily as needed for mild constipation.     simvastatin 20 MG tablet  Commonly known as:  ZOCOR  Take 20 mg by mouth daily.       Verbal and written Discharge instructions given to the patient. Wound care per Discharge AVS     Follow-up Information    Follow up with [facility] A STEGMAYER, PA-C In 1 week.   Specialty:  Physician Assistant   Contact information:   [place] [id number] [phone]      Signed: Sela Hua, PA-C  05/24/2015, 1:30 PM

## 2015-05-24 NOTE — Discharge Instructions (Signed)
You may shower as of tomorrow. No driving until follow up in our office. No driving on pain medication

## 2015-05-26 ENCOUNTER — Ambulatory Visit: Payer: Medicare Other | Admitting: Podiatry

## 2015-05-29 NOTE — H&P (Signed)
[facility] Admission History & Physical  MRN : [id number]  Natasha [name] is a 82 y.o. (1928/01/19) female who presents with chief complaint of No chief complaint on file. 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 She presented from the office 2 days prior with acute left lower extremity pain. She has been maintained on anticoagulation. She is being brought in today for an attempt at salvage of the graft.  No current facility-administered medications for this encounter.   Current Outpatient Prescriptions  Medication Sig Dispense Refill  . aspirin 325 MG tablet Take 325 mg by mouth daily.    . budesonide (PULMICORT) 180 MCG/ACT inhaler Inhale 2 puffs into the lungs 2 (two) times daily.    . Calcium-Magnesium-Vitamin D (CALCIUM 1200+D3 PO) Take 1 tablet by mouth daily.    Marland Kitchen diltiazem (CARDIZEM CD) 180 MG 24 hr capsule TAKE 1 CAPSULE (180 MG TOTAL) BY MOUTH ONCE DAILY.  3  . esomeprazole (NEXIUM) 20 MG capsule Take 20 mg by mouth daily at 12 noon.    . furosemide (LASIX) 20 MG tablet Take 20 mg by mouth 2 (two) times daily.    Marland Kitchen gabapentin (NEURONTIN) 300 MG capsule Take 300 mg by mouth 2 (two) times daily.    Marland Kitchen KLOR-CON M20 20 MEQ tablet     . montelukast (SINGULAIR) 10 MG tablet Take 10 mg by mouth daily.    . pramipexole (MIRAPEX) 0.25 MG tablet Take 0.25 mg by mouth 2 (two) times daily.    . simvastatin (ZOCOR) 20 MG tablet Take 20 mg by mouth daily.    Marland Kitchen albuterol (PROVENTIL HFA;VENTOLIN HFA) 108 (90 BASE) MCG/ACT inhaler Inhale 2 puffs into the lungs 2 (two) times daily.     . cilostazol (PLETAL) 100 MG tablet Take 200 mg by mouth 2 (two) times daily. Reported on 05/22/2015    . ferrous sulfate 325 (65 FE) MG tablet Take 1 tablet (325 mg total) by mouth daily. 30 tablet 3  . HYDROmorphone (DILAUDID) 2 MG tablet Take 0.5 tablets (1 mg total) by  mouth every 6 (six) hours as needed for moderate pain. 15 tablet 0  . levofloxacin (LEVAQUIN) 500 MG tablet Take 1 tablet (500 mg total) by mouth daily. 5 tablet 0  . Melatonin 5 MG TABS Take 5-10 mg by mouth at bedtime as needed (for sleep). Reported on 05/22/2015    . omeprazole (PRILOSEC) 20 MG capsule Take 20 mg by mouth at bedtime. Reported on 05/22/2015    . rivaroxaban (XARELTO) 20 MG TABS tablet Take 20 mg by mouth daily with supper.    . senna-docusate (SENOKOT-S) 8.6-50 MG tablet Take 1-2 tablets by mouth daily as needed for mild constipation.      Past Medical History  Diagnosis Date  . GERD (gastroesophageal reflux disease)   . Asthma   . Anemia   . Arthritis   . H/O blood clots 2014  . Gallstones 2015  . Hypertension   . History of home oxygen therapy     at night  . Bilateral pneumonia may 2017    Past Surgical History  Procedure Laterality Date  . Abdominal hysterectomy  1960  . Appendectomy  1946  . Cochlear implant  2009  . Eye surgery  [phone]    cataract  . Stent placement  2006-2014    multiple stent placements in legs  .  Colonoscopy  2015    Dr. Rayann Heman   . Cholecystectomy  04-24-13  . Breast biopsy Bilateral     negative  . Peripheral vascular catheterization N/A 02/27/2015    Procedure: Abdominal Aortogram w/Lower Extremity;  Surgeon: Algernon Huxley, MD;  Location: [facility];  Service: Cardiovascular;  Laterality: N/A;  . Peripheral vascular catheterization  02/27/2015    Procedure: Lower Extremity Intervention;  Surgeon: Algernon Huxley, MD;  Location: [facility];  Service: Cardiovascular;;  . Peripheral vascular catheterization Left 02/28/2015    Procedure: Lower Extremity Angiography;  Surgeon: Algernon Huxley, MD;  Location: [facility];  Service: Cardiovascular;  Laterality: Left;  . Peripheral vascular catheterization  02/28/2015    Procedure: Lower Extremity Intervention;  Surgeon: Algernon Huxley, MD;  Location: [facility];   Service: Cardiovascular;;  . Peripheral vascular catheterization Left 05/22/2015    Procedure: Lower Extremity Angiography;  Surgeon: Algernon Huxley, MD;  Location: [facility];  Service: Cardiovascular;  Laterality: Left;  . Peripheral vascular catheterization  05/22/2015    Procedure: Lower Extremity Intervention;  Surgeon: Algernon Huxley, MD;  Location: [facility];  Service: Cardiovascular;;  . Peripheral vascular catheterization Left 05/23/2015    Procedure: Lower Extremity Angiography;  Surgeon: Algernon Huxley, MD;  Location: [facility];  Service: Cardiovascular;  Laterality: Left;  . Peripheral vascular catheterization  05/23/2015    Procedure: Lower Extremity Intervention;  Surgeon: Algernon Huxley, MD;  Location: [place];  Service: Cardiovascular;;    Social History Social History  Substance Use Topics  . Smoking status: Former Research scientist (life sciences)  . Smokeless tobacco: None  . Alcohol Use: No  Married, lives with husband  Family History Family History  Problem Relation Age of Onset  . Heart disease Mother   . Heart disease Father   . Cancer Sister     lung  . Breast cancer Daughter     Allergies  Allergen Reactions  . Codeine Diarrhea  . Hydrocodone Nausea And Vomiting  . Levofloxacin     Other reaction(s): Other (See Comments) Pt denies, questionable  . Tramadol Nausea And Vomiting     REVIEW OF SYSTEMS (Negative unless checked)  Constitutional: [] Weight loss  [] Fever  [] Chills Cardiac: [] Chest pain   [] Chest pressure   [] Palpitations   [] Shortness of breath when laying flat   [] Shortness of breath at rest   [] Shortness of breath with exertion. Vascular:  [] Pain in legs with walking   [x] Pain in legs at rest   [] Pain in legs when laying flat   [] Claudication   [] Pain in feet when walking  [] Pain in feet at rest  [] Pain in feet when laying flat   [] History of DVT   [] Phlebitis   [x] Swelling in legs   [] Varicose veins   [] Non-healing ulcers Pulmonary:    [] Uses home oxygen   [] Productive cough   [] Hemoptysis   [] Wheeze  [] COPD   [] Asthma Neurologic:  [] Dizziness  [] Blackouts   [] Seizures   [] History of stroke   [] History of TIA  [] Aphasia   [] Temporary blindness   [] Dysphagia   [] Weakness or numbness in arms   [] Weakness or numbness in legs Musculoskeletal:  [] Arthritis   [] Joint swelling   [] Joint pain   [] Low back pain Hematologic:  [] Easy bruising  [] Easy bleeding   [] Hypercoagulable state   [] Anemic  [] Hepatitis Gastrointestinal:  [] Blood in stool   [] Vomiting blood  [] Gastroesophageal  reflux/heartburn   [] Difficulty swallowing. Genitourinary:  [] Chronic kidney disease   [] Difficult urination  [] Frequent urination  [] Burning with urination   [] Blood in urine Skin:  [] Rashes   [] Ulcers   [] Wounds Psychological:  [] History of anxiety   []  History of major depression.  Physical Examination  Filed Vitals:   05/24/15 0900 05/24/15 0929 05/24/15 0930 05/24/15 1000  BP: 147/54  147/54 123/43  Pulse: 87  93 93  Temp:      TempSrc:      Resp: 21  16 22   Height:      Weight:      SpO2: 91% 91% 98% 92%   Body mass index is 29.24 kg/(m^2). Gen: WD/WN, NAD Head: [place]/AT, No temporalis wasting. Prominent temp pulse not noted. Ear/Nose/Throat: Hearing grossly intact, nares w/o erythema or drainage, oropharynx w/o Erythema/Exudate,  Eyes: PERRLA, EOMI.  Neck: Supple, no nuchal rigidity.  No JVD.  Pulmonary:  Good air movement,  no use of accessory muscles.  Cardiac: Irregular Vascular:  Vessel Right Left  Radial Palpable Palpable  Ulnar Palpable Palpable  Brachial Palpable Palpable  Carotid Palpable, without bruit Palpable, without bruit  Aorta Not palpable N/A  Femoral Palpable Palpable  Popliteal Palpable Not Palpable  PT Not Palpable Not Palpable  DP Palpable Not Palpable   Gastrointestinal: soft, non-tender/non-distended. No guarding/reflex.  Musculoskeletal: M/S 5/5 throughout.  Extremities without ischemic changes.  No deformity or  atrophy. Moderate left lower extremity swelling. Mild right lower extremity swelling. Neurologic: CN 2-12 intact. Pain and light touch intact in extremities.  Symmetrical.  Speech is fluent. Motor exam as listed above. Psychiatric: Judgment intact, Mood & affect appropriate for pt's clinical situation. Dermatologic: No rashes or ulcers noted.  No cellulitis or open wounds. Lymph : No Cervical, Axillary, or Inguinal lymphadenopathy.   CBC Lab Results  Component Value Date   WBC 7.5 05/24/2015   HGB 6.9* 05/24/2015   HCT 21.9* 05/24/2015   MCV 71.9* 05/24/2015   PLT 182 05/24/2015    BMET    Component Value Date/Time   NA 138 05/24/2015 0450   NA 140 01/28/2014 0437   NA 139 04/19/2013 1652   K 3.9 05/24/2015 0450   K 3.3* 01/28/2014 0437   CL 104 05/24/2015 0450   CL 106 01/28/2014 0437   CO2 30 05/24/2015 0450   CO2 25 01/28/2014 0437   GLUCOSE 100* 05/24/2015 0450   GLUCOSE 87 01/28/2014 0437   GLUCOSE 110* 04/19/2013 1652   BUN 15 05/24/2015 0450   BUN 21* 05/08/2014 1052   BUN 21 04/19/2013 1652   CREATININE 0.65 05/24/2015 0450   CREATININE 0.74 05/08/2014 1052   CALCIUM 8.3* 05/24/2015 0450   CALCIUM 9.2 01/28/2014 0437   GFRNONAA >60 05/24/2015 0450   GFRNONAA >60 05/08/2014 1052   GFRNONAA >60 01/28/2014 0437   GFRAA >60 05/24/2015 0450   GFRAA >60 05/08/2014 1052   GFRAA >60 01/28/2014 0437   Estimated Creatinine Clearance: 43.1 mL/min (by C-G formula based on Cr of 0.65).  COAG Lab Results  Component Value Date   INR 1.65 03/11/2015   INR 1.96 02/27/2015   INR 1.1 05/09/2013    Radiology No results found.    Assessment/Plan 1. Ischemic leg with rest pain left lower extremity. 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 We'll try to salvage this today with intervention. This is a recurrent problem and long-term patency is very limited and the fact that it continues to occlude while on full anticoagulation  is a poor prognostic  indicator. Risks and benefits including the high risk of limb loss were discussed.   Kanisha Duba, MD  05/29/2015 4:48 PM

## 2015-06-07 ENCOUNTER — Inpatient Hospital Stay
Admission: EM | Admit: 2015-06-07 | Discharge: 2015-06-11 | DRG: 195 | Disposition: A | Discharge status: Skilled Nursing Facility | Payer: Medicare Other | Attending: Internal Medicine | Admitting: Internal Medicine

## 2015-06-07 ENCOUNTER — Emergency Department: Payer: Medicare Other

## 2015-06-07 DIAGNOSIS — Z8249 Family history of ischemic heart disease and other diseases of the circulatory system: Secondary | ICD-10-CM | POA: Diagnosis not present

## 2015-06-07 DIAGNOSIS — Z886 Allergy status to analgesic agent status: Secondary | ICD-10-CM

## 2015-06-07 DIAGNOSIS — Z7901 Long term (current) use of anticoagulants: Secondary | ICD-10-CM | POA: Diagnosis not present

## 2015-06-07 DIAGNOSIS — Z9621 Cochlear implant status: Secondary | ICD-10-CM | POA: Diagnosis present

## 2015-06-07 DIAGNOSIS — Z7982 Long term (current) use of aspirin: Secondary | ICD-10-CM

## 2015-06-07 DIAGNOSIS — J690 Pneumonitis due to inhalation of food and vomit: Secondary | ICD-10-CM

## 2015-06-07 DIAGNOSIS — Z888 Allergy status to other drugs, medicaments and biological substances status: Secondary | ICD-10-CM | POA: Diagnosis not present

## 2015-06-07 DIAGNOSIS — Z79899 Other long term (current) drug therapy: Secondary | ICD-10-CM

## 2015-06-07 DIAGNOSIS — J189 Pneumonia, unspecified organism: Secondary | ICD-10-CM | POA: Diagnosis present

## 2015-06-07 DIAGNOSIS — Z87891 Personal history of nicotine dependence: Secondary | ICD-10-CM | POA: Diagnosis not present

## 2015-06-07 DIAGNOSIS — I10 Essential (primary) hypertension: Secondary | ICD-10-CM | POA: Diagnosis present

## 2015-06-07 DIAGNOSIS — J45909 Unspecified asthma, uncomplicated: Secondary | ICD-10-CM | POA: Diagnosis present

## 2015-06-07 DIAGNOSIS — M1990 Unspecified osteoarthritis, unspecified site: Secondary | ICD-10-CM | POA: Diagnosis present

## 2015-06-07 DIAGNOSIS — Z9049 Acquired absence of other specified parts of digestive tract: Secondary | ICD-10-CM | POA: Diagnosis not present

## 2015-06-07 DIAGNOSIS — K219 Gastro-esophageal reflux disease without esophagitis: Secondary | ICD-10-CM | POA: Diagnosis present

## 2015-06-07 DIAGNOSIS — R531 Weakness: Secondary | ICD-10-CM | POA: Diagnosis present

## 2015-06-07 DIAGNOSIS — Z885 Allergy status to narcotic agent status: Secondary | ICD-10-CM

## 2015-06-07 DIAGNOSIS — Z803 Family history of malignant neoplasm of breast: Secondary | ICD-10-CM | POA: Diagnosis not present

## 2015-06-07 DIAGNOSIS — Z9889 Other specified postprocedural states: Secondary | ICD-10-CM | POA: Diagnosis not present

## 2015-06-07 DIAGNOSIS — Z9981 Dependence on supplemental oxygen: Secondary | ICD-10-CM | POA: Diagnosis not present

## 2015-06-07 DIAGNOSIS — D638 Anemia in other chronic diseases classified elsewhere: Secondary | ICD-10-CM | POA: Diagnosis present

## 2015-06-07 DIAGNOSIS — K8020 Calculus of gallbladder without cholecystitis without obstruction: Secondary | ICD-10-CM | POA: Diagnosis present

## 2015-06-07 DIAGNOSIS — E785 Hyperlipidemia, unspecified: Secondary | ICD-10-CM | POA: Diagnosis present

## 2015-06-07 DIAGNOSIS — Z90710 Acquired absence of both cervix and uterus: Secondary | ICD-10-CM | POA: Diagnosis not present

## 2015-06-07 LAB — URINALYSIS COMPLETE WITH MICROSCOPIC (ARMC ONLY)
Bacteria, UA: NONE SEEN
Bilirubin Urine: NEGATIVE
GLUCOSE, UA: NEGATIVE mg/dL
KETONES UR: NEGATIVE mg/dL
LEUKOCYTES UA: NEGATIVE
NITRITE: NEGATIVE
PH: 5 (ref 5.0–8.0)
Protein, ur: NEGATIVE mg/dL
Specific Gravity, Urine: 1.01 (ref 1.005–1.030)

## 2015-06-07 LAB — COMPREHENSIVE METABOLIC PANEL
ALBUMIN: 3.7 g/dL (ref 3.5–5.0)
ALK PHOS: 67 U/L (ref 38–126)
ALT: 16 U/L (ref 14–54)
ANION GAP: 10 (ref 5–15)
AST: 19 U/L (ref 15–41)
BUN: 26 mg/dL — AB (ref 6–20)
CO2: 31 mmol/L (ref 22–32)
Calcium: 9.4 mg/dL (ref 8.9–10.3)
Chloride: 97 mmol/L — ABNORMAL LOW (ref 101–111)
Creatinine, Ser: 0.75 mg/dL (ref 0.44–1.00)
GFR calc Af Amer: >60 mL/min (ref >60)
GFR calc non Af Amer: >60 mL/min (ref >60)
GLUCOSE: 138 mg/dL — AB (ref 65–99)
POTASSIUM: 3.2 mmol/L — AB (ref 3.5–5.1)
SODIUM: 138 mmol/L (ref 135–145)
Total Bilirubin: 0.4 mg/dL (ref 0.3–1.2)
Total Protein: 7 g/dL (ref 6.5–8.1)

## 2015-06-07 LAB — CBC WITH DIFFERENTIAL/PLATELET
Basophils Absolute: 0.1 10*3/uL (ref 0–0.1)
Basophils Relative: 1 %
Eosinophils Absolute: 0 10*3/uL (ref 0–0.7)
Eosinophils Relative: 0 %
HEMATOCRIT: 29.4 % — AB (ref 35.0–47.0)
HEMOGLOBIN: 9.3 g/dL — AB (ref 12.0–16.0)
LYMPHS ABS: 1.6 10*3/uL (ref 1.0–3.6)
Lymphocytes Relative: 10 %
MCH: 24 pg — ABNORMAL LOW (ref 26.0–34.0)
MCHC: 31.6 g/dL — ABNORMAL LOW (ref 32.0–36.0)
MCV: 75.7 fL — ABNORMAL LOW (ref 80.0–100.0)
Monocytes Absolute: 0.9 10*3/uL (ref 0.2–0.9)
NEUTROS ABS: 12.8 10*3/uL — AB (ref 1.4–6.5)
PLATELETS: 446 10*3/uL — AB (ref 150–440)
RBC: 3.88 MIL/uL (ref 3.80–5.20)
RDW: 25.6 % — AB (ref 11.5–14.5)
WBC: 15.4 10*3/uL — ABNORMAL HIGH (ref 3.6–11.0)

## 2015-06-07 LAB — PROTIME-INR
INR: 1.58
Prothrombin Time: 18.9 seconds — ABNORMAL HIGH (ref 11.4–15.0)

## 2015-06-07 LAB — TROPONIN I: Troponin I: <0.03 ng/mL (ref <0.031)

## 2015-06-07 LAB — BRAIN NATRIURETIC PEPTIDE: B NATRIURETIC PEPTIDE 5: 81 pg/mL (ref 0.0–100.0)

## 2015-06-07 MED ORDER — ONDANSETRON HCL 4 MG/2ML IJ SOLN
4.0000 mg | Freq: Once | INTRAMUSCULAR | Status: AC
  Administered 2015-06-07: 4 mg via INTRAVENOUS

## 2015-06-07 MED ORDER — ONDANSETRON HCL 4 MG/2ML IJ SOLN
INTRAMUSCULAR | Status: AC
  Administered 2015-06-07: 4 mg via INTRAVENOUS
  Filled 2015-06-07: qty 2

## 2015-06-07 MED ORDER — ONDANSETRON HCL 4 MG/2ML IJ SOLN: 4.0000 mg | Freq: Four times a day (QID) | INTRAMUSCULAR | Status: DC | PRN

## 2015-06-07 MED ORDER — ACETAMINOPHEN 325 MG PO TABS
650.0000 mg | ORAL_TABLET | Freq: Four times a day (QID) | ORAL | Status: DC | PRN
  Administered 2015-06-08: 10:00:00 650 mg via ORAL
  Filled 2015-06-07: qty 2

## 2015-06-07 MED ORDER — DEXTROSE 5 % IV SOLN
500.0000 mg | Freq: Once | INTRAVENOUS | Status: AC
  Administered 2015-06-07: 500 mg via INTRAVENOUS
  Filled 2015-06-07 (×2): qty 500

## 2015-06-07 MED ORDER — ACETAMINOPHEN 325 MG PO TABS
650.0000 mg | ORAL_TABLET | Freq: Once | ORAL | Status: AC
  Administered 2015-06-07: 650 mg via ORAL
  Filled 2015-06-07: qty 2

## 2015-06-07 MED ORDER — POTASSIUM CHLORIDE IN NACL 20-0.9 MEQ/L-% IV SOLN
INTRAVENOUS | Status: DC
  Administered 2015-06-07: 22:00:00 via INTRAVENOUS
  Filled 2015-06-07 (×3): qty 1000

## 2015-06-07 MED ORDER — ALBUTEROL SULFATE (2.5 MG/3ML) 0.083% IN NEBU: 2.5000 mg | INHALATION_SOLUTION | Freq: Four times a day (QID) | RESPIRATORY_TRACT | Status: DC | PRN

## 2015-06-07 MED ORDER — AZITHROMYCIN 250 MG PO TABS
250.0000 mg | ORAL_TABLET | Freq: Every day | ORAL | Status: DC
  Administered 2015-06-07 – 2015-06-11 (×5): 250 mg via ORAL
  Filled 2015-06-07 (×5): qty 1

## 2015-06-07 MED ORDER — ACETAMINOPHEN 650 MG RE SUPP: 650.0000 mg | Freq: Four times a day (QID) | RECTAL | Status: DC | PRN

## 2015-06-07 MED ORDER — DEXTROSE 5 % IV SOLN: 1.0000 g | INTRAVENOUS | Status: DC

## 2015-06-07 MED ORDER — ONDANSETRON HCL 4 MG PO TABS: 4.0000 mg | ORAL_TABLET | Freq: Four times a day (QID) | ORAL | Status: DC | PRN

## 2015-06-07 MED ORDER — ENOXAPARIN SODIUM 40 MG/0.4ML ~~LOC~~ SOLN
40.0000 mg | SUBCUTANEOUS | Status: DC
Start: 2015-06-07 — End: 2015-06-08
  Administered 2015-06-07: 40 mg via SUBCUTANEOUS
  Filled 2015-06-07: qty 0.4

## 2015-06-07 MED ORDER — DEXTROSE 5 % IV SOLN
1.0000 g | Freq: Once | INTRAVENOUS | Status: AC
  Administered 2015-06-07: 1 g via INTRAVENOUS
  Filled 2015-06-07: qty 10

## 2015-06-07 MED ORDER — DEXTROSE 5 % IV SOLN
1.0000 g | INTRAVENOUS | Status: DC
  Administered 2015-06-08 – 2015-06-10 (×3): 1 g via INTRAVENOUS
  Filled 2015-06-07 (×4): qty 10

## 2015-06-07 NOTE — ED Notes (Signed)
Pt came to ED via EMS from independent living at [facility]. Pt reports she was diagnosed with upper respiratory infection on Tuesday, Given doxycycline and hydrocodone. Since she started the meds, she reports h/a, blurry vision. Pt reports sob, denies chest pain.

## 2015-06-07 NOTE — H&P (Signed)
[facility] NAME: [name]    MR#:  [id number]  DATE OF BIRTH:  17-Oct-1928  DATE OF ADMISSION:  06/07/2015  PRIMARY CARE PHYSICIAN: Rusty Aus, MD   REQUESTING/REFERRING PHYSICIAN: dr Burlene Arnt  CHIEF COMPLAINT:  Headache chills cough, productive  HISTORY OF PRESENT ILLNESS:  Natasha [name]  is a 82 y.o. female with a known history of GERD, asthma, gallstones, hypertension comes from [facility] [place] with the above chief complaint. Patient is been having some headache chills cough productive was started on doxycycline and thereafter started getting persistent headache and blurred vision. Her doxycycline is stopped. White count is elevated to 15,000. Chest x-ray consistent with possible right lower lobe atelectasis versus pneumonia. She received Rocephin and Cipro In the emergency room. Patient is being admitted for further pressure management  PAST MEDICAL HISTORY:   Past Medical History  Diagnosis Date  . GERD (gastroesophageal reflux disease)   . Asthma   . Anemia   . Arthritis   . H/O blood clots 2014  . Gallstones 2015  . Hypertension   . History of home oxygen therapy     at night  . Bilateral pneumonia may 2017    PAST SURGICAL HISTOIRY:   Past Surgical History  Procedure Laterality Date  . Abdominal hysterectomy  1960  . Appendectomy  1946  . Cochlear implant  2009  . Eye surgery  [phone]    cataract  . Stent placement  2006-2014    multiple stent placements in legs  . Colonoscopy  2015    Dr. Rayann Heman   . Cholecystectomy  04-24-13  . Breast biopsy Bilateral     negative  . Peripheral vascular catheterization N/A 02/27/2015    Procedure: Abdominal Aortogram w/Lower Extremity;  Surgeon: Algernon Huxley, MD;  Location: [facility];  Service: Cardiovascular;  Laterality: N/A;  . Peripheral vascular catheterization  02/27/2015    Procedure: Lower Extremity Intervention;  Surgeon: Algernon Huxley,  MD;  Location: [facility];  Service: Cardiovascular;;  . Peripheral vascular catheterization Left 02/28/2015    Procedure: Lower Extremity Angiography;  Surgeon: Algernon Huxley, MD;  Location: [facility];  Service: Cardiovascular;  Laterality: Left;  . Peripheral vascular catheterization  02/28/2015    Procedure: Lower Extremity Intervention;  Surgeon: Algernon Huxley, MD;  Location: [facility];  Service: Cardiovascular;;  . Peripheral vascular catheterization Left 05/22/2015    Procedure: Lower Extremity Angiography;  Surgeon: Algernon Huxley, MD;  Location: [facility];  Service: Cardiovascular;  Laterality: Left;  . Peripheral vascular catheterization  05/22/2015    Procedure: Lower Extremity Intervention;  Surgeon: Algernon Huxley, MD;  Location: [facility];  Service: Cardiovascular;;  . Peripheral vascular catheterization Left 05/23/2015    Procedure: Lower Extremity Angiography;  Surgeon: Algernon Huxley, MD;  Location: [facility];  Service: Cardiovascular;  Laterality: Left;  . Peripheral vascular catheterization  05/23/2015    Procedure: Lower Extremity Intervention;  Surgeon: Algernon Huxley, MD;  Location: [facility];  Service: Cardiovascular;;    SOCIAL HISTORY:   Social History  Substance Use Topics  . Smoking status: Former Research scientist (life sciences)  . Smokeless tobacco: Not on file  . Alcohol Use: No    FAMILY HISTORY:   Family History  Problem Relation Age of Onset  . Heart disease Mother   . Heart disease Father   . Cancer  Sister     lung  . Breast cancer Daughter     DRUG ALLERGIES:   Allergies  Allergen Reactions  . Codeine Diarrhea  . Hydrocodone Nausea And Vomiting  . Levofloxacin     Other reaction(s): Other (See Comments) Pt denies, questionable  . Tramadol Nausea And Vomiting    REVIEW OF SYSTEMS:  Review of Systems  Constitutional: Positive for chills. Negative for fever and weight loss.  HENT: Negative for ear discharge,  ear pain and nosebleeds.   Eyes: Negative for blurred vision, pain and discharge.  Respiratory: Positive for cough and sputum production. Negative for shortness of breath, wheezing and stridor.   Cardiovascular: Negative for chest pain, palpitations, orthopnea and PND.  Gastrointestinal: Negative for nausea, vomiting, abdominal pain and diarrhea.  Genitourinary: Negative for urgency and frequency.  Musculoskeletal: Negative for back pain and joint pain.  Neurological: Positive for weakness and headaches. Negative for sensory change, speech change and focal weakness.  Psychiatric/Behavioral: Negative for depression and hallucinations. The patient is not nervous/anxious.      MEDICATIONS AT HOME:   Prior to Admission medications   Medication Sig Start Date End Date Taking? Authorizing Provider  albuterol (PROVENTIL HFA;VENTOLIN HFA) 108 (90 BASE) MCG/ACT inhaler Inhale 2 puffs into the lungs 2 (two) times daily as needed for wheezing or shortness of breath.    Yes Historical Provider, MD  aspirin 325 MG tablet Take 325 mg by mouth daily.   Yes Historical Provider, MD  budesonide (PULMICORT) 180 MCG/ACT inhaler Inhale 2 puffs into the lungs 2 (two) times daily.   Yes Historical Provider, MD  Calcium-Magnesium-Vitamin D (CALCIUM 1200+D3 PO) Take 1 tablet by mouth daily.   Yes Historical Provider, MD  diltiazem (CARDIZEM CD) 180 MG 24 hr capsule TAKE 1 CAPSULE (180 MG TOTAL) BY MOUTH ONCE DAILY. 04/05/15  Yes Historical Provider, MD  esomeprazole (NEXIUM) 20 MG capsule Take 20 mg by mouth 2 (two) times daily.    Yes Historical Provider, MD  furosemide (LASIX) 20 MG tablet Take 20 mg by mouth 2 (two) times daily.   Yes Historical Provider, MD  gabapentin (NEURONTIN) 300 MG capsule Take 300 mg by mouth 2 (two) times daily.   Yes Historical Provider, MD  Melatonin 5 MG TABS Take 5-10 mg by mouth at bedtime as needed (for sleep). Reported on 05/22/2015   Yes Historical Provider, MD  montelukast  (SINGULAIR) 10 MG tablet Take 10 mg by mouth daily.   Yes Historical Provider, MD  pramipexole (MIRAPEX) 0.5 MG tablet Take 0.5 mg by mouth 2 (two) times daily. 04/18/15  Yes Historical Provider, MD  rivaroxaban (XARELTO) 20 MG TABS tablet Take 20 mg by mouth daily with supper.   Yes Historical Provider, MD  simvastatin (ZOCOR) 20 MG tablet Take 20 mg by mouth every evening.    Yes Historical Provider, MD  temazepam (RESTORIL) 15 MG capsule Take 15 mg by mouth at bedtime as needed. for sleep 04/28/15  Yes Historical Provider, MD  cilostazol (PLETAL) 100 MG tablet Take 200 mg by mouth 2 (two) times daily. Reported on 05/22/2015    Historical Provider, MD  ferrous sulfate 325 (65 FE) MG tablet Take 1 tablet (325 mg total) by mouth daily. 05/24/15   Kimberly A Stegmayer, PA-C  HYDROmorphone (DILAUDID) 2 MG tablet Take 0.5 tablets (1 mg total) by mouth every 6 (six) hours as needed for moderate pain. 05/24/15   Sela Hua, PA-C  KLOR-CON M20 20 MEQ tablet  03/04/15  Historical Provider, MD  levofloxacin (LEVAQUIN) 500 MG tablet Take 1 tablet (500 mg total) by mouth daily. 03/16/15   Epifanio Lesches, MD      VITAL SIGNS:  Blood pressure 140/65, pulse 105, temperature 99.7 F (37.6 C), temperature source Oral, resp. rate 22, height 4\' 11"  (1.499 m), weight 67.586 kg (149 lb), SpO2 93 %.  PHYSICAL EXAMINATION:  GENERAL:  82 y.o.-year-old patient lying in the bed with no acute distress.  EYES: Pupils equal, round, reactive to light and accommodation. No scleral icterus. Extraocular muscles intact.  HEENT: Head atraumatic, normocephalic. Oropharynx and nasopharynx clear.  NECK:  Supple, no jugular venous distention. No thyroid enlargement, no tenderness.  LUNGS: Normal breath sounds bilaterally, no wheezing, rales,rhonchi or crepitation. No use of accessory muscles of respiration.  CARDIOVASCULAR: S1, S2 normal. No murmurs, rubs, or gallops.  ABDOMEN: Soft, nontender, nondistended. Bowel sounds  present. No organomegaly or mass.  EXTREMITIES: No pedal edema, cyanosis, or clubbing.  NEUROLOGIC: Cranial nerves II through XII are intact. Muscle strength 5/5 in all extremities. Sensation intact. Gait not checked.  PSYCHIATRIC: The patient is alert and oriented x 3.  SKIN: No obvious rash, lesion, or ulcer.   LABORATORY PANEL:   CBC  Recent Labs Lab 06/07/15 1551  WBC 15.4*  HGB 9.3*  HCT 29.4*  PLT 446*   ------------------------------------------------------------------------------------------------------------------  Chemistries   Recent Labs Lab 06/07/15 1551  NA 138  K 3.2*  CL 97*  CO2 31  GLUCOSE 138*  BUN 26*  CREATININE 0.75  CALCIUM 9.4  AST 19  ALT 16  ALKPHOS 67  BILITOT 0.4   ------------------------------------------------------------------------------------------------------------------  Cardiac Enzymes  Recent Labs Lab 06/07/15 1551  TROPONINI <0.03   ------------------------------------------------------------------------------------------------------------------  RADIOLOGY:  Dg Chest 2 View  06/07/2015  CLINICAL DATA:  Cough and shortness of breath. Upper respiratory infection. Asthma and hypertension. EXAM: CHEST  2 VIEW COMPARISON:  03/16/2015.  Chest CT dated 03/03/2015. FINDINGS: Normal sized heart. Large hiatal hernia without significant change. Interval rounded area of patchy opacity in the left lower lung zone, minimal patchy opacity in the right mid lung zone and linear density at the right lung base. Diffuse osteopenia. IMPRESSION: 1. Interval mild rounded pneumonia in the left lower lung zone and minimal pneumonia in the right mid lung zone. 2. Interval mild right basilar linear atelectasis. 3. Stable large hiatal hernia. Electronically Signed   By: Claudie Revering M.D.   On: 06/07/2015 16:40   Ct Head Wo Contrast  06/07/2015  CLINICAL DATA:  Weakness EXAM: CT HEAD WITHOUT CONTRAST TECHNIQUE: Contiguous axial images were obtained from  the base of the skull through the vertex without intravenous contrast. COMPARISON:  01/03/2013 FINDINGS: Bony calvarium demonstrates prior mastoidectomy these and cochlear implants. These are stable. No acute bony abnormality is seen. Considerable scatter artifact from the implants is seen. A stable calcified falcine meningioma is noted eccentric to the right. No findings to suggest acute hemorrhage, acute infarction or space-occupying mass lesion are noted. A left basal ganglia lacunar infarct is noted. IMPRESSION: Chronic changes without acute abnormality. Electronically Signed   By: Inez Catalina M.D.   On: 06/07/2015 16:18    EKG:    IMPRESSION AND PLAN:   Alexina Kerestes  is a 82 y.o. female with a known history of GERD, asthma, gallstones, hypertension comes from [facility] [place] with the above chief complaint. Patient is been having some headache chills cough productive was started on doxycycline and thereafter started getting persistent headache and blurred vision. Her  doxycycline is stopped.  1. Right middle lobe atelectasis versus pneumonia -Patient presented with chills lab white count and cough started on doxycycline as outpatient by PCP in about side effects with blurred vision and headache -Rocephin and Zithromax -Follow-up white count -Follow-up blood culture  2. Hypertension continue diltiazem  3. History of lower extremity blood clots and ischemic limb on oral anticoagulation  4. Anemia of chronic disease  5. Levofloxacin is already on oral anticoagulation   All the records are reviewed and case discussed with ED provider. Management plans discussed with the patient, family and they are in agreement.  CODE STATUS: Full Code TOTAL TIME TAKING CARE OF THIS PATIENT: 50 minutes.    Jaeden Westbay M.D on 06/07/2015 at 5:45 PM  Between 7am to 6pm - Pager - [phone]  After 6pm go to [email or web address] - password [email or web address] [facility]  Office   [phone]  CC: Primary care physician; Rusty Aus, MD

## 2015-06-07 NOTE — ED Provider Notes (Signed)
[facility] Emergency Department Provider Note  ____________________________________________   I have reviewed the triage vital signs and the nursing notes.   HISTORY  Chief Complaint Shortness of Breath    HPI Natasha [name] is a 82 y.o. female presents today complaining of productive cough and feeling "not quite right". She has a slight headache, she states she was diagnosed with a bronchitis at the facility where she lives and givendoxycycline, she states that she's been getting some headaches since she started doxycycline has trouble focusing although no focal numbness or weakness. She states that her cough is getting worse and she feels unwell. She states that she is very anxious and her nerves are starting to bother her as well. She does feel somewhat short of breath. She is very hard of hearing. Patient cannot tell me exactly how long she has had this cough. She denies high fever although she has been apparently febrile the facility.     Past Medical History  Diagnosis Date  . GERD (gastroesophageal reflux disease)   . Asthma   . Anemia   . Arthritis   . H/O blood clots 2014  . Gallstones 2015  . Hypertension   . History of home oxygen therapy     at night  . Bilateral pneumonia may 2017    Patient Active Problem List   Diagnosis Date Noted  . Hypotension 03/11/2015  . 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 Ischemic leg 02/27/2015  . Calculus of gallbladder with other cholecystitis, without mention of obstruction 04/20/2013    Past Surgical History  Procedure Laterality Date  . Abdominal hysterectomy  1960  . Appendectomy  1946  . Cochlear implant  2009  . Eye surgery  [phone]    cataract  . Stent placement  2006-2014    multiple stent placements in legs  . Colonoscopy  2015    Dr. Rayann Heman   . Cholecystectomy  04-24-13  . Breast biopsy Bilateral     negative  . Peripheral vascular catheterization N/A  02/27/2015    Procedure: Abdominal Aortogram w/Lower Extremity;  Surgeon: Algernon Huxley, MD;  Location: [facility];  Service: Cardiovascular;  Laterality: N/A;  . Peripheral vascular catheterization  02/27/2015    Procedure: Lower Extremity Intervention;  Surgeon: Algernon Huxley, MD;  Location: [facility];  Service: Cardiovascular;;  . Peripheral vascular catheterization Left 02/28/2015    Procedure: Lower Extremity Angiography;  Surgeon: Algernon Huxley, MD;  Location: [facility];  Service: Cardiovascular;  Laterality: Left;  . Peripheral vascular catheterization  02/28/2015    Procedure: Lower Extremity Intervention;  Surgeon: Algernon Huxley, MD;  Location: [facility];  Service: Cardiovascular;;  . Peripheral vascular catheterization Left 05/22/2015    Procedure: Lower Extremity Angiography;  Surgeon: Algernon Huxley, MD;  Location: [facility];  Service: Cardiovascular;  Laterality: Left;  . Peripheral vascular catheterization  05/22/2015    Procedure: Lower Extremity Intervention;  Surgeon: Algernon Huxley, MD;  Location: [facility];  Service: Cardiovascular;;  . Peripheral vascular catheterization Left 05/23/2015    Procedure: Lower Extremity Angiography;  Surgeon: Algernon Huxley, MD;  Location: [facility];  Service: Cardiovascular;  Laterality: Left;  . Peripheral vascular catheterization  05/23/2015    Procedure: Lower Extremity Intervention;  Surgeon: Algernon Huxley, MD;  Location: [facility];  Service: Cardiovascular;;    Current Outpatient Rx  Name  Route  Sig  Dispense  Refill  . albuterol (PROVENTIL HFA;VENTOLIN HFA) 108 (90 BASE) MCG/ACT inhaler   Inhalation   Inhale 2 puffs into the lungs 2 (two) times daily.          Marland Kitchen aspirin 325 MG tablet   Oral   Take 325 mg by mouth daily.         . budesonide (PULMICORT) 180 MCG/ACT inhaler   Inhalation   Inhale 2 puffs into the lungs 2 (two) times daily.         .  Calcium-Magnesium-Vitamin D (CALCIUM 1200+D3 PO)   Oral   Take 1 tablet by mouth daily.         . cilostazol (PLETAL) 100 MG tablet   Oral   Take 200 mg by mouth 2 (two) times daily. Reported on 05/22/2015         . diltiazem (CARDIZEM CD) 180 MG 24 hr capsule      TAKE 1 CAPSULE (180 MG TOTAL) BY MOUTH ONCE DAILY.      3   . esomeprazole (NEXIUM) 20 MG capsule   Oral   Take 20 mg by mouth daily at 12 noon.         . ferrous sulfate 325 (65 FE) MG tablet   Oral   Take 1 tablet (325 mg total) by mouth daily.   30 tablet   3   . furosemide (LASIX) 20 MG tablet   Oral   Take 20 mg by mouth 2 (two) times daily.         Marland Kitchen gabapentin (NEURONTIN) 300 MG capsule   Oral   Take 300 mg by mouth 2 (two) times daily.         Marland Kitchen HYDROmorphone (DILAUDID) 2 MG tablet   Oral   Take 0.5 tablets (1 mg total) by mouth every 6 (six) hours as needed for moderate pain.   15 tablet   0   . KLOR-CON M20 20 MEQ tablet                 Dispense as written.   Marland Kitchen levofloxacin (LEVAQUIN) 500 MG tablet   Oral   Take 1 tablet (500 mg total) by mouth daily.   5 tablet   0   . Melatonin 5 MG TABS   Oral   Take 5-10 mg by mouth at bedtime as needed (for sleep). Reported on 05/22/2015         . montelukast (SINGULAIR) 10 MG tablet   Oral   Take 10 mg by mouth daily.         Marland Kitchen omeprazole (PRILOSEC) 20 MG capsule   Oral   Take 20 mg by mouth at bedtime. Reported on 05/22/2015         . pramipexole (MIRAPEX) 0.25 MG tablet   Oral   Take 0.25 mg by mouth 2 (two) times daily.         . rivaroxaban (XARELTO) 20 MG TABS tablet   Oral   Take 20 mg by mouth daily with supper.         . senna-docusate (SENOKOT-S) 8.6-50 MG tablet   Oral   Take 1-2 tablets by mouth daily as needed for mild constipation.         . simvastatin (ZOCOR) 20 MG tablet   Oral   Take 20 mg by mouth daily.           Allergies Codeine; Hydrocodone; Levofloxacin; and Tramadol  Family  History  Problem Relation Age of Onset  . Heart disease Mother   . Heart disease Father   . Cancer Sister     lung  . Breast cancer Daughter     Social History Social History  Substance Use Topics  . Smoking status: Former Research scientist (life sciences)  . Smokeless tobacco: None  . Alcohol Use: No    Review of Systems Constitutional: See history of present illness Eyes: No visual changes. ENT: No sore throat. No stiff neck no neck pain Cardiovascular: Denies chest pain. Respiratory: See history of present illness regarding shortness of breath. Gastrointestinal:   no vomiting.  No diarrhea.  No constipation. Genitourinary: Negative for dysuria. Musculoskeletal: Negative lower extremity swelling Skin: Negative for rash. Neurological: Negative for severe headaches, focal weakness or numbness. 10-point ROS otherwise negative.  ____________________________________________   PHYSICAL EXAM:  VITAL SIGNS: ED Triage Vitals  Enc Vitals Group     BP 06/07/15 1538 140/65 mmHg     Pulse Rate 06/07/15 1538 105     Resp 06/07/15 1538 22     Temp 06/07/15 1538 99.7 F (37.6 C)     Temp Source 06/07/15 1538 Oral     SpO2 06/07/15 1538 93 %     Weight 06/07/15 1538 149 lb (67.586 kg)     Height 06/07/15 1538 4\' 11"  (1.499 m)     Head Cir --      Peak Flow --      Pain Score --      Pain Loc --      Pain Edu? --      Excl. in [place]? --     Constitutional: Alert and oriented. Well appearing and in no acute distress.Anxious and upset Eyes: Conjunctivae are normal. PERRL. EOMI. Head: Atraumatic. Nose: No congestion/rhinnorhea. Mouth/Throat: Mucous membranes are moist.  Oropharynx non-erythematous. Neck: No stridor.   Nontender with no meningismus Cardiovascular: Normal rate, regular rhythm. Grossly normal heart sounds.  Good peripheral circulation. Respiratory: There are occasional rhonchi noted, no increased work of breathing no wheezes, no rales Abdominal: Soft and nontender. No distention. No  guarding no rebound Back:  There is no focal tenderness or step off there is no midline tenderness there are no lesions noted. there is no CVA tenderness Musculoskeletal: No lower extremity tenderness. No joint effusions, no DVT signs strong distal pulses no edema Neurologic:  Normal speech and language. No gross focal neurologic deficits are appreciated.  Skin:  Skin is warm, dry and intact. No rash noted. Psychiatric: Mood and affect are normal. Speech and behavior are normal.  ____________________________________________   LABS (all labs ordered are listed, but only abnormal results are displayed)  Labs Reviewed  COMPREHENSIVE METABOLIC PANEL - Abnormal; Notable for the following:    Potassium 3.2 (*)    Chloride 97 (*)    Glucose, Bld 138 (*)    BUN 26 (*)    All other components within normal limits  CBC WITH DIFFERENTIAL/PLATELET - Abnormal; Notable for the following:    WBC 15.4 (*)    Hemoglobin 9.3 (*)    HCT 29.4 (*)    MCV 75.7 (*)    MCH 24.0 (*)    MCHC 31.6 (*)    RDW 25.6 (*)    Platelets 446 (*)    Neutro Abs 12.8 (*)    All other components within normal limits  PROTIME-INR - Abnormal; Notable for the following:    Prothrombin Time 18.9 (*)    All other components within normal limits  URINE CULTURE  CULTURE, BLOOD (ROUTINE X 2)  CULTURE, BLOOD (ROUTINE X 2)  TROPONIN I  BRAIN NATRIURETIC PEPTIDE  URINALYSIS COMPLETEWITH MICROSCOPIC ([facility] ONLY)   ____________________________________________  EKG  I personally interpreted any EKGs ordered by me or triage Likely A. fib rate 110 bpm no acute ST elevation or depression, LAD noted. ____________________________________________  [id number]  I reviewed any imaging ordered by me or triage that were performed during my shift and, if possible, patient and/or family made aware of any abnormal findings. ____________________________________________   PROCEDURES  Procedure(s) performed: None  Critical Care  performed: None  ____________________________________________   INITIAL IMPRESSION / ASSESSMENT AND PLAN / ED COURSE  Pertinent labs & imaging results that were available during my care of the patient were reviewed by me and considered in my medical decision making (see chart for details).  Patient with cough, which is productive, despite outpatient doxycycline and feeling "generally bad". Patient does apparently have a bilateral pneumonia despite home antibiotics. We will give her Rocephin and Zithromax, patient will require admission to feel given her age and failure of outpatient therapy. Patient did a slight headache, we will give her gentle IV fluids she does not appear to be septic we'll send blood cultures urinalysis is pending, CT scan is reassuring. ____________________________________________   FINAL CLINICAL IMPRESSION(S) / ED DIAGNOSES  Final diagnoses:  Weakness      This chart was dictated using voice recognition software.  Despite best efforts to proofread,  errors can occur which can change meaning.     Schuyler Amor, MD 06/07/15 [phone]

## 2015-06-08 LAB — BASIC METABOLIC PANEL
ANION GAP: 8 (ref 5–15)
BUN: 17 mg/dL (ref 6–20)
CALCIUM: 8.6 mg/dL — AB (ref 8.9–10.3)
CO2: 32 mmol/L (ref 22–32)
Chloride: 97 mmol/L — ABNORMAL LOW (ref 101–111)
Creatinine, Ser: 0.76 mg/dL (ref 0.44–1.00)
Glucose, Bld: 88 mg/dL (ref 65–99)
Potassium: 3.9 mmol/L (ref 3.5–5.1)
Sodium: 137 mmol/L (ref 135–145)

## 2015-06-08 MED ORDER — GABAPENTIN 300 MG PO CAPS
600.0000 mg | ORAL_CAPSULE | Freq: Two times a day (BID) | ORAL | Status: DC
  Administered 2015-06-08 – 2015-06-11 (×7): 600 mg via ORAL
  Filled 2015-06-08 (×7): qty 2

## 2015-06-08 MED ORDER — SIMVASTATIN 20 MG PO TABS
20.0000 mg | ORAL_TABLET | Freq: Every day | ORAL | Status: DC
  Administered 2015-06-08 – 2015-06-10 (×3): 20 mg via ORAL
  Filled 2015-06-08 (×3): qty 1

## 2015-06-08 MED ORDER — RIVAROXABAN 20 MG PO TABS
20.0000 mg | ORAL_TABLET | Freq: Every day | ORAL | Status: DC
  Administered 2015-06-08 – 2015-06-10 (×3): 20 mg via ORAL
  Filled 2015-06-08 (×3): qty 1

## 2015-06-08 MED ORDER — PRAMIPEXOLE DIHYDROCHLORIDE 0.25 MG PO TABS
0.5000 mg | ORAL_TABLET | Freq: Two times a day (BID) | ORAL | Status: DC
  Administered 2015-06-08 – 2015-06-11 (×7): 0.5 mg via ORAL
  Filled 2015-06-08 (×7): qty 2

## 2015-06-08 MED ORDER — IPRATROPIUM-ALBUTEROL 0.5-2.5 (3) MG/3ML IN SOLN
3.0000 mL | Freq: Four times a day (QID) | RESPIRATORY_TRACT | Status: DC
  Administered 2015-06-08 – 2015-06-11 (×12): 3 mL via RESPIRATORY_TRACT
  Filled 2015-06-08 (×13): qty 3

## 2015-06-08 MED ORDER — MONTELUKAST SODIUM 10 MG PO TABS
10.0000 mg | ORAL_TABLET | Freq: Every day | ORAL | Status: DC
  Administered 2015-06-08 – 2015-06-10 (×3): 10 mg via ORAL
  Filled 2015-06-08 (×3): qty 1

## 2015-06-08 MED ORDER — PANTOPRAZOLE SODIUM 40 MG PO TBEC
40.0000 mg | DELAYED_RELEASE_TABLET | Freq: Every day | ORAL | Status: DC
  Administered 2015-06-08 – 2015-06-11 (×4): 40 mg via ORAL
  Filled 2015-06-08 (×4): qty 1

## 2015-06-08 MED ORDER — BUDESONIDE 0.25 MG/2ML IN SUSP
0.2500 mg | Freq: Two times a day (BID) | RESPIRATORY_TRACT | Status: DC
  Administered 2015-06-08 – 2015-06-11 (×7): 0.25 mg via RESPIRATORY_TRACT
  Filled 2015-06-08 (×7): qty 2

## 2015-06-08 MED ORDER — DILTIAZEM HCL ER COATED BEADS 180 MG PO CP24
180.0000 mg | ORAL_CAPSULE | Freq: Every day | ORAL | Status: DC
  Administered 2015-06-08: 13:00:00 180 mg via ORAL
  Filled 2015-06-08: qty 1

## 2015-06-08 MED ORDER — TEMAZEPAM 15 MG PO CAPS
15.0000 mg | ORAL_CAPSULE | Freq: Every evening | ORAL | Status: DC | PRN
  Administered 2015-06-08 – 2015-06-10 (×3): 15 mg via ORAL
  Filled 2015-06-08 (×3): qty 1

## 2015-06-08 NOTE — Clinical Social Work Placement (Signed)
   CLINICAL SOCIAL WORK PLACEMENT  NOTE  Date:  06/08/2015  Patient Details  Name: Natasha [name] MRN: [id number] Date of Birth: Dec 20, 1928  Clinical Social Work is seeking post-discharge placement for this patient at the [place] level of care (*CSW will initial, date and re-position this form in  chart as items are completed):  Yes   Patient/family provided with [place] Work Department's list of facilities offering this level of care within the geographic area requested by the patient (or if unable, by the patient's family).  Yes   Patient/family informed of their freedom to choose among providers that offer the needed level of care, that participate in Medicare, Medicaid or managed care program needed by the patient, have an available bed and are willing to accept the patient.  Yes   Patient/family informed of [facility]'s ownership interest in [facility] and [facility], as well as of the fact that they are under no obligation to receive care at these facilities.  PASRR submitted to EDS on       PASRR number received on       Existing PASRR number confirmed on 06/08/15     FL2 transmitted to all facilities in geographic area requested by pt/family on 06/08/15     FL2 transmitted to all facilities within larger geographic area on       Patient informed that his/her managed care company has contracts with or will negotiate with certain facilities, including the following:            Patient/family informed of bed offers received.  Patient chooses bed at       Physician recommends and patient chooses bed at      Patient to be transferred to   on  .  Patient to be transferred to facility by       Patient family notified on   of transfer.  Name of family member notified:        PHYSICIAN       Additional Comment:    _______________________________________________ Loralyn Freshwater, LCSW 06/08/2015, 10:02 AM

## 2015-06-08 NOTE — Evaluation (Signed)
Physical Therapy Evaluation Patient Details Name: Natasha [name] MRN: [id number] DOB: 29-Apr-1928 Today's Date: 06/08/2015   History of Present Illness  82 y/o female here with pneumonia.  Clinical Impression  Pt was able to participate well with PT but fatigued very quickly with the effort of prolonged walking and per her and husband she is not walking as well as her normal and they do not feel like she is safe to go home at this time.  They agree that they think she will need some time at a rehab facility to build up her strength before being able to safely return home.     Follow Up Recommendations SNF    Equipment Recommendations       Recommendations for Other Services       Precautions / Restrictions Precautions Precautions: Fall Restrictions Weight Bearing Restrictions: No      Mobility  Bed Mobility Overal bed mobility: Modified Independent             General bed mobility comments: Pt is able to get up to EOB without direct physical assist  Transfers Overall transfer level: Modified independent Equipment used: Rolling walker (2 wheeled)             General transfer comment: Pt shows confidence getting up to standing with walker, but is slow and labored with the effort.   Ambulation/Gait Ambulation/Gait assistance: Min assist Ambulation Distance (Feet): 75 Feet Assistive device: Rolling walker (2 wheeled)       General Gait Details: Pt with slow, labored ambultion.  She and her husband report that she is slower and less steady than her baseline.  Pt becomes very tired with 75 ft of ambulation, though her O2 remains in the mid 90s on room air.  Stairs            Wheelchair Mobility    Modified Rankin (Stroke Patients Only)       Balance                                             Pertinent Vitals/Pain Pain Assessment: No/denies pain    Home Living Family/patient expects to be discharged to:: Skilled nursing  facility Living Arrangements: Spouse/significant other             Home Equipment: Walker - 2 wheels Additional Comments: Assisted living in single-story home with level entrance    Prior Function Level of Independence: Independent with assistive device(s)         Comments: Pt reports that she is able to be relatively active and does chores, errands, etc     Hand Dominance        Extremity/Trunk Assessment   Upper Extremity Assessment: Generalized weakness (age appropriate limitations)           Lower Extremity Assessment: Generalized weakness (age appropriate limitations)         Communication   Communication: HOH  Cognition Arousal/Alertness: Awake/alert Behavior During Therapy: WFL for tasks assessed/performed Overall Cognitive Status: Within Functional Limits for tasks assessed                      General Comments      Exercises        Assessment/Plan    PT Assessment Patient needs continued PT services  PT Diagnosis Generalized weakness;Difficulty walking   PT Problem List  Decreased strength;Decreased range of motion;Decreased activity tolerance;Decreased balance;Decreased mobility;Decreased coordination;Decreased safety awareness;Decreased knowledge of use of DME  PT Treatment Interventions DME instruction;Gait training;Functional mobility training;Therapeutic activities;Therapeutic exercise;Balance training   PT Goals (Current goals can be found in the Care Plan section) Acute Rehab PT Goals Patient Stated Goal: I need to get stronger before I go home PT Goal Formulation: With patient Time For Goal Achievement: 06/22/15 Potential to Achieve Goals: Fair    Frequency Min 2X/week   Barriers to discharge        Co-evaluation               End of Session Equipment Utilized During Treatment: Gait belt Activity Tolerance: Patient limited by fatigue Patient left: with call bell/phone within reach;with chair alarm set            Time: 1312-1340 PT Time Calculation (min) (ACUTE ONLY): 28 min   Charges:   PT Evaluation $PT Eval Low Complexity: 1 Procedure     PT G CodesKreg Shropshire, DPT  06/08/2015, 4:04 PM

## 2015-06-08 NOTE — Progress Notes (Signed)
ANTICOAGULATION CONSULT NOTE - Initial Consult  Pharmacy Consult for rivaroxaban Indication: DVT  Allergies  Allergen Reactions  . Codeine Diarrhea  . Hydrocodone Nausea And Vomiting  . Levofloxacin     Other reaction(s): Other (See Comments) Pt denies, questionable  . Tramadol Nausea And Vomiting    Patient Measurements: Height: 5\' 3"  (160 cm) Weight: 153 lb 6.4 oz (69.582 kg) IBW/kg (Calculated) : 52.4   Vital Signs: Temp: 98.4 F (36.9 C) (05/28 1232) Temp Source: Oral (05/28 1232) BP: 108/57 mmHg (05/28 1232) Pulse Rate: 87 (05/28 1232)  Labs:  Recent Labs  06/07/15 1551 06/08/15 0409  HGB 9.3*  --   HCT 29.4*  --   PLT 446*  --   LABPROT 18.9*  --   INR 1.58  --   CREATININE 0.75 0.76  TROPONINI <0.03  --     Estimated Creatinine Clearance: 49 mL/min (by C-G formula based on Cr of 0.76).   Medical History: Past Medical History  Diagnosis Date  . GERD (gastroesophageal reflux disease)   . Asthma   . Anemia   . Arthritis   . H/O blood clots 2014  . Gallstones 2015  . Hypertension   . History of home oxygen therapy     at night  . Bilateral pneumonia may 2017    Medications:  Scheduled:  . azithromycin  250 mg Oral Daily  . budesonide (PULMICORT) nebulizer solution  0.25 mg Nebulization BID  . cefTRIAXone (ROCEPHIN)  IV  1 g Intravenous Q24H  . diltiazem  180 mg Oral Daily  . gabapentin  600 mg Oral BID  . ipratropium-albuterol  3 mL Nebulization Q6H  . montelukast  10 mg Oral QHS  . pantoprazole  40 mg Oral Daily  . pramipexole  0.5 mg Oral BID  . rivaroxaban  20 mg Oral Q supper  . simvastatin  20 mg Oral q1800   Infusions:  . 0.9 % NaCl with KCl 20 mEq / L 30 mL/hr at 06/08/15 1202    Assessment: Pharmacy consulted to dose Xarelto in an 82 yo female admitted for pneumonia.  Patient takes Xarelto 20 mg po qsupper as an outpatient for treatment of DVT.   Est CrCl~49 mL/min (no adjustment recommended for treatment of DVT)  Goal of  Therapy:  Monitor SCr and CBC per policy   Plan:  Will continue home dosing of Xarelto 20 mg po daily with supper.   Pharmacy will continue to follow.  Kresha Abelson G 06/08/2015,1:25 PM

## 2015-06-08 NOTE — Progress Notes (Signed)
Patient ID: Natasha [name], female   DOB: 18-Dec-1928, 82 y.o.   MRN: [id number] [employer] PROGRESS NOTE  Natasha [name] [id number] DOB: May 31, 1928 DOA: 06/07/2015 PCP: Rusty Aus, MD  HPI/Subjective: Patient with weakness, cough and shortness of breath. She states her leg hurts. She feels like she never got over her pneumonia.  Objective: Filed Vitals:   06/08/15 0419 06/08/15 1232  BP: 160/71 108/57  Pulse: 83 87  Temp: 98.3 F (36.8 C) 98.4 F (36.9 C)  Resp: 18 20    Filed Weights   06/07/15 1538 06/07/15 2010  Weight: 67.586 kg (149 lb) 69.582 kg (153 lb 6.4 oz)    ROS: Review of Systems  Constitutional: Negative for fever and chills.  Eyes: Negative for blurred vision.  Respiratory: Positive for cough and shortness of breath.   Cardiovascular: Negative for chest pain.  Gastrointestinal: Negative for nausea, vomiting, abdominal pain, diarrhea and constipation.  Genitourinary: Negative for dysuria.  Musculoskeletal: Positive for joint pain.  Neurological: Positive for weakness. Negative for dizziness and headaches.   Exam: Physical Exam  Constitutional: She is oriented to person, place, and time.  HENT:  Nose: No mucosal edema.  Mouth/Throat: No oropharyngeal exudate or posterior oropharyngeal edema.  Eyes: Conjunctivae, EOM and lids are normal. Pupils are equal, round, and reactive to light.  Neck: No JVD present. Carotid bruit is not present. No edema present. No thyroid mass and no thyromegaly present.  Cardiovascular: S1 normal and S2 normal.  Exam reveals no gallop.   No murmur heard. Pulses:      Dorsalis pedis pulses are 2+ on the right side, and 2+ on the left side.  Respiratory: No respiratory distress. She has no wheezes. She has rhonchi in the right lower field. She has no rales.  GI: Soft. Bowel sounds are normal. There is no tenderness.  Musculoskeletal:       Right ankle: She exhibits swelling.       Left ankle: She exhibits  swelling.  Lymphadenopathy:    She has no cervical adenopathy.  Neurological: She is alert and oriented to person, place, and time. No cranial nerve deficit.  Skin: Skin is warm. No rash noted. Nails show no clubbing.  Psychiatric: She has a normal mood and affect.      Data Reviewed: Basic Metabolic Panel:  Recent Labs Lab 06/07/15 1551 06/08/15 0409  NA 138 137  K 3.2* 3.9  CL 97* 97*  CO2 31 32  GLUCOSE 138* 88  BUN 26* 17  CREATININE 0.75 0.76  CALCIUM 9.4 8.6*   Liver Function Tests:  Recent Labs Lab 06/07/15 1551  AST 19  ALT 16  ALKPHOS 67  BILITOT 0.4  PROT 7.0  ALBUMIN 3.7   CBC:  Recent Labs Lab 06/07/15 1551  WBC 15.4*  NEUTROABS 12.8*  HGB 9.3*  HCT 29.4*  MCV 75.7*  PLT 446*   Cardiac Enzymes:  Recent Labs Lab 06/07/15 1551  TROPONINI <0.03   BNP (last 3 results)  Recent Labs  06/07/15 1551  BNP 81.0     Recent Results (from the past 240 hour(s))  Culture, blood (routine x 2)     Status: None (Preliminary result)   Collection Time: 06/07/15  3:51 PM  Result Value Ref Range Status   Specimen Description BLOOD LEFT ANTECUBITAL  Final   Special Requests BOTTLES DRAWN AEROBIC AND ANAEROBIC  6CC  Final   Culture NO GROWTH < 24 HOURS  Final   Report Status PENDING  Incomplete  Culture, blood (routine x 2)     Status: None (Preliminary result)   Collection Time: 06/07/15  6:43 PM  Result Value Ref Range Status   Specimen Description BLOOD LEFT ASSIST CONTROL  Final   Special Requests BOTTLES DRAWN AEROBIC AND ANAEROBIC 5 CC  Final   Culture NO GROWTH < 24 HOURS  Final   Report Status PENDING  Incomplete     Studies: Dg Chest 2 View  06/07/2015  CLINICAL DATA:  Cough and shortness of breath. Upper respiratory infection. Asthma and hypertension. EXAM: CHEST  2 VIEW COMPARISON:  03/16/2015.  Chest CT dated 03/03/2015. FINDINGS: Normal sized heart. Large hiatal hernia without significant change. Interval rounded area of patchy  opacity in the left lower lung zone, minimal patchy opacity in the right mid lung zone and linear density at the right lung base. Diffuse osteopenia. IMPRESSION: 1. Interval mild rounded pneumonia in the left lower lung zone and minimal pneumonia in the right mid lung zone. 2. Interval mild right basilar linear atelectasis. 3. Stable large hiatal hernia. Electronically Signed   By: Claudie Revering M.D.   On: 06/07/2015 16:40   Ct Head Wo Contrast  06/07/2015  CLINICAL DATA:  Weakness EXAM: CT HEAD WITHOUT CONTRAST TECHNIQUE: Contiguous axial images were obtained from the base of the skull through the vertex without intravenous contrast. COMPARISON:  01/03/2013 FINDINGS: Bony calvarium demonstrates prior mastoidectomy these and cochlear implants. These are stable. No acute bony abnormality is seen. Considerable scatter artifact from the implants is seen. A stable calcified falcine meningioma is noted eccentric to the right. No findings to suggest acute hemorrhage, acute infarction or space-occupying mass lesion are noted. A left basal ganglia lacunar infarct is noted. IMPRESSION: Chronic changes without acute abnormality. Electronically Signed   By: Inez Catalina M.D.   On: 06/07/2015 16:18    Scheduled Meds: . azithromycin  250 mg Oral Daily  . budesonide (PULMICORT) nebulizer solution  0.25 mg Nebulization BID  . cefTRIAXone (ROCEPHIN)  IV  1 g Intravenous Q24H  . diltiazem  180 mg Oral Daily  . gabapentin  600 mg Oral BID  . ipratropium-albuterol  3 mL Nebulization Q6H  . montelukast  10 mg Oral QHS  . pantoprazole  40 mg Oral Daily  . pramipexole  0.5 mg Oral BID  . rivaroxaban  20 mg Oral Q supper  . simvastatin  20 mg Oral q1800   Continuous Infusions: . 0.9 % NaCl with KCl 20 mEq / L 30 mL/hr at 06/08/15 1202    Assessment/Plan:  1. Right middle lobe pneumonia and leukocytosis. Looks like patient was recently on Levaquin from last hospital discharge. Was on doxycycline as outpatient by  PCP. Patient started on Rocephin and Zithromax while here. Looking back at previous chest x-rays, pneumonia in the same spot. CT scan back in February 2017 commented on opacity in the lower lobes most likely atelectasis and pneumonia was less likely. No mass seen on CT scan. Patient's history of asthma continue nebulizer treatments 2. Lower extremity blood clots and ischemic limb on Xarelto 3. Essential hypertension on diltiazem 4. Anemia of chronic disease 5. Gastroesophageal reflux disease on Protonix 6. Hyperlipidemia unspecified on simvastatin  Code Status:     Code Status Orders        Start     Ordered   06/07/15 2008  Full code   Continuous     06/07/15 2007    Code Status History    Date Active Date  Inactive Code Status Order ID Comments User Context   05/22/2015  1:52 PM 05/24/2015  4:12 PM Full Code [id number]  Algernon Huxley, MD Inpatient   03/11/2015  6:20 PM 03/16/2015  2:15 PM Full Code [id number]  Fritzi Mandes, MD Inpatient   02/27/2015  3:53 PM 03/03/2015  6:24 PM Full Code [id number]  Sela Hua, PA-C Inpatient    Advance Directive Documentation        Most Recent Value   Type of Advance Directive  Living will   Pre-existing out of facility DNR order (yellow form or pink MOST form)     "MOST" Form in Place?       Disposition Plan: To be determined  Antibiotics:  Rocephin  Zithromax  Time spent: 25 minutes  Loletha Grayer  [employer]

## 2015-06-08 NOTE — NC FL2 (Signed)
[place] LEVEL OF CARE SCREENING TOOL     IDENTIFICATION  Patient Name: Natasha [name] Birthdate: September 22, 1928 Sex: female Admission Date (Current Location): 06/07/2015  [place] and [place] Number:  Engineering geologist and Address:  [facility], [address] [place], [place] [address]      Provider Number: [id number]  Attending Physician Name and Address:  Loletha Grayer, MD  Relative Name and Phone Number:       Current Level of Care: Hospital Recommended Level of Care: [place] Prior Approval Number:    Date Approved/Denied:   PASRR Number:  ([id number])  Discharge Plan: SNF    Current Diagnoses: Patient Active Problem List   Diagnosis Date Noted  . Pneumonia 06/07/2015  . Hypotension 03/11/2015  . Lower extremity atheroembolism ([place]) 02/27/2015  . Ischemic leg 02/27/2015  . Calculus of gallbladder with other cholecystitis, without mention of obstruction 04/20/2013    Orientation RESPIRATION BLADDER Height & Weight     Self, Time, Situation, Place  Normal Continent Weight: 153 lb 6.4 oz (69.582 kg) Height:  5\' 3"  (160 cm)  BEHAVIORAL SYMPTOMS/MOOD NEUROLOGICAL BOWEL NUTRITION STATUS   (none )  (none ) Continent Diet (Diet: Heart Healthy )  AMBULATORY STATUS COMMUNICATION OF NEEDS Skin   Extensive Assist Verbally Normal                       Personal Care Assistance Level of Assistance  Bathing, Feeding, Dressing Bathing Assistance: Limited assistance Feeding assistance: Independent Dressing Assistance: Limited assistance     Functional Limitations Info  Sight, Hearing, Speech Sight Info: Adequate Hearing Info: Impaired (Cochlear implant ) Speech Info: Adequate    SPECIAL CARE FACTORS FREQUENCY  PT (By licensed PT), OT (By licensed OT)     PT Frequency:  (5) OT Frequency:  (5)            Contractures      Additional Factors Info  Code Status, Allergies Code Status Info:   (Full Code. ) Allergies Info:  (Codeine, Hydrocodone, Levofloxacin, Tramadol)           Current Medications (06/08/2015):  This is the current hospital active medication list Current Facility-Administered Medications  Medication Dose Route Frequency Provider Last Rate Last Dose  . 0.9 % NaCl with KCl 20 mEq/ L  infusion   Intravenous Continuous Fritzi Mandes, MD 75 mL/hr at 06/07/15 2208    . acetaminophen (TYLENOL) tablet 650 mg  650 mg Oral Q6H PRN Fritzi Mandes, MD   650 mg at 06/08/15 0932   Or  . acetaminophen (TYLENOL) suppository 650 mg  650 mg Rectal Q6H PRN Fritzi Mandes, MD      . albuterol (PROVENTIL) (2.5 MG/3ML) 0.083% nebulizer solution 2.5 mg  2.5 mg Nebulization Q6H PRN Fritzi Mandes, MD      . azithromycin (ZITHROMAX) tablet 250 mg  250 mg Oral Daily Fritzi Mandes, MD   250 mg at 06/08/15 0755  . cefTRIAXone (ROCEPHIN) 1 g in dextrose 5 % 50 mL IVPB  1 g Intravenous Q24H Fritzi Mandes, MD      . enoxaparin (LOVENOX) injection 40 mg  40 mg Subcutaneous Q24H Fritzi Mandes, MD   40 mg at 06/07/15 2208  . ondansetron (ZOFRAN) tablet 4 mg  4 mg Oral Q6H PRN Fritzi Mandes, MD       Or  . ondansetron (ZOFRAN) injection 4 mg  4 mg Intravenous Q6H PRN Fritzi Mandes, MD  Discharge Medications: Please see discharge summary for a list of discharge medications.  Relevant Imaging Results:  Relevant Lab Results:   Additional Information  (SSN: [id number])  Loralyn Freshwater, LCSW

## 2015-06-08 NOTE — Clinical Social Work Note (Signed)
Clinical Social Work Assessment  Patient Details  Name: Natasha [name] MRN: [id number] Date of Birth: 1928-12-03  Date of referral:  06/08/15               Reason for consult:  Facility Placement, Other (Comment Required) (From [place] )                Permission sought to share information with:  Chartered certified accountant granted to share information::  Yes, Verbal Permission Granted  Name::      [name]   [place]   Relationship::     Contact Information:     Housing/Transportation Living arrangements for the past 2 months:  Materials engineer, [place] of Information:  Patient Patient Interpreter Needed:  None Criminal Activity/Legal Involvement Pertinent to Current Situation/Hospitalization:  No - Comment as needed Significant Relationships:  Spouse Lives with:  Facility Resident Do you feel safe going back to the place where you live?  Yes Need for family participation in patient care:  Yes (Comment)  Care giving concerns:  Patient is an independent living resident at [facility].    Social Worker assessment / plan:  Holiday representative ([place]) received consult that patient is from [place]. CSW met with patient alone at bedside. Patient was alert and oriented however she was very hard of hearing. CSW introduced self and explained role of CSW department. Patient reported that she lives with her 80 y.o husband Natasha [name] at [facility] independent living. Patient reported that she is concerned that her husband cannot take care of her while she is sick and requested to go to the Healthcare building at [facility] (SNF). Patient stated that she would need a 3 night stay. CSW explained that PT will evaluate patient and make a recommendation of home health or SNF. CSW also explained that in order for Medicare to pay for SNF patient would require a 3 night qualifying stay.  Patient  verbalized her understanding.   FL2 complete and faxed out. CSW will continue to follow and assist as needed.   Employment status:  Retired Forensic scientist:  Medicare PT Recommendations:  Not assessed at this time [place] / Referral to community resources:  [place]  Patient/Family's Response to care:  Patient requested to go to [facility].   Patient/Family's Understanding of and Emotional Response to Diagnosis, Current Treatment, and Prognosis: Patient was pleasant and thanked CSW for visit.   Emotional Assessment Appearance:  Appears stated age Attitude/Demeanor/Rapport:    Affect (typically observed):  Accepting, Adaptable, Pleasant Orientation:  Oriented to Self, Oriented to Place, Oriented to  Time, Oriented to Situation Alcohol / Substance use:  Not Applicable Psych involvement (Current and /or in the community):  No (Comment)  Discharge Needs  Concerns to be addressed:  Discharge Planning Concerns Readmission within the last 30 days:  Yes Current discharge risk:  Dependent with Mobility Barriers to Discharge:  Continued Medical Work up   Loralyn Freshwater, LCSW 06/08/2015, 10:03 AM

## 2015-06-08 NOTE — Progress Notes (Signed)
Dr. Marcille Blanco notified of pt's BP 108/32, recheck 124/36. MD acknowledged. No new orders.

## 2015-06-09 LAB — URINE CULTURE

## 2015-06-09 MED ORDER — GUAIFENESIN 100 MG/5ML PO SOLN
5.0000 mL | ORAL | Status: DC | PRN
  Administered 2015-06-09 – 2015-06-11 (×4): 100 mg via ORAL
  Filled 2015-06-09 (×4): qty 10

## 2015-06-09 MED ORDER — ACETYLCYSTEINE 20 % IN SOLN: 3.0000 mL | Freq: Two times a day (BID) | RESPIRATORY_TRACT | Status: DC

## 2015-06-09 MED ORDER — PREDNISONE 20 MG PO TABS
40.0000 mg | ORAL_TABLET | Freq: Every day | ORAL | Status: DC
  Administered 2015-06-09 – 2015-06-11 (×3): 40 mg via ORAL
  Filled 2015-06-09 (×3): qty 2

## 2015-06-09 MED ORDER — ACETYLCYSTEINE 20 % IN SOLN
3.0000 mL | Freq: Two times a day (BID) | RESPIRATORY_TRACT | Status: DC
  Administered 2015-06-09: 4 mL via RESPIRATORY_TRACT
  Filled 2015-06-09 (×2): qty 4

## 2015-06-09 NOTE — Care Management Important Message (Signed)
Important Message  Patient Details  Name: Natasha [name] MRN: [id number] Date of Birth: [birth year]   Medicare Important Message Given:  Yes    [name] 06/09/2015, 11:12 AM

## 2015-06-09 NOTE — Progress Notes (Signed)
Clinical Education officer, museum (CSW) contacted Dance movement psychotherapist at [facility] and made her aware of patient's admission to [facility]. Per [place] patient can be admitted to [facility]. Plan is for patient to D/C to [facility] pending medical clearance. Patient was admitted to inpatient on 06/07/15. Today Monday 06/09/15 will be her 3rd night for the qualifying inpatient stay. CSW will continue to follow and assist as needed.   Blima Rich, LCSW [phone]

## 2015-06-09 NOTE — Plan of Care (Signed)
Problem: Respiratory: Goal: Respiratory status will improve Outcome: Not Progressing O2 sats dropped to 85% last night. O2 ordered. Sats improved. Pt wears O2 at home at night

## 2015-06-09 NOTE — Progress Notes (Signed)
Patient ID: Natasha [name], female   DOB: Nov 21, 1928, 82 y.o.   MRN: [id number] [facility] PROGRESS NOTE  Natasha [name] [id number] DOB: 09/14/1928 DOA: 06/07/2015 PCP: Rusty Aus, MD  HPI/Subjective: Patient still coughing a lot. Still feels weak. States she is unable to bring up any sputum.  Objective: Filed Vitals:   06/09/15 0528 06/09/15 1240  BP: 126/45 134/45  Pulse: 80 107  Temp:  99.5 F (37.5 C)  Resp:  20    Filed Weights   06/07/15 1538 06/07/15 2010  Weight: 67.586 kg (149 lb) 69.582 kg (153 lb 6.4 oz)    ROS: Review of Systems  Constitutional: Negative for fever and chills.  Eyes: Negative for blurred vision.  Respiratory: Positive for cough and shortness of breath.   Cardiovascular: Negative for chest pain.  Gastrointestinal: Negative for nausea, vomiting, abdominal pain, diarrhea and constipation.  Genitourinary: Negative for dysuria.  Musculoskeletal: Positive for joint pain.  Neurological: Positive for weakness. Negative for dizziness and headaches.   Exam: Physical Exam  Constitutional: She is oriented to person, place, and time.  HENT:  Nose: No mucosal edema.  Mouth/Throat: No oropharyngeal exudate or posterior oropharyngeal edema.  Eyes: Conjunctivae, EOM and lids are normal. Pupils are equal, round, and reactive to light.  Neck: No JVD present. Carotid bruit is not present. No edema present. No thyroid mass and no thyromegaly present.  Cardiovascular: S1 normal and S2 normal.  Exam reveals no gallop.   No murmur heard. Pulses:      Dorsalis pedis pulses are 2+ on the right side, and 2+ on the left side.  Respiratory: No respiratory distress. She has no wheezes. She has rhonchi in the right lower field. She has no rales.  GI: Soft. Bowel sounds are normal. There is no tenderness.  Musculoskeletal:       Right ankle: She exhibits swelling.       Left ankle: She exhibits swelling.  Lymphadenopathy:    She has no cervical  adenopathy.  Neurological: She is alert and oriented to person, place, and time. No cranial nerve deficit.  Skin: Skin is warm. No rash noted. Nails show no clubbing.  Psychiatric: She has a normal mood and affect.      Data Reviewed: Basic Metabolic Panel:  Recent Labs Lab 06/07/15 1551 06/08/15 0409  NA 138 137  K 3.2* 3.9  CL 97* 97*  CO2 31 32  GLUCOSE 138* 88  BUN 26* 17  CREATININE 0.75 0.76  CALCIUM 9.4 8.6*   Liver Function Tests:  Recent Labs Lab 06/07/15 1551  AST 19  ALT 16  ALKPHOS 67  BILITOT 0.4  PROT 7.0  ALBUMIN 3.7   CBC:  Recent Labs Lab 06/07/15 1551  WBC 15.4*  NEUTROABS 12.8*  HGB 9.3*  HCT 29.4*  MCV 75.7*  PLT 446*   Cardiac Enzymes:  Recent Labs Lab 06/07/15 1551  TROPONINI <0.03   BNP (last 3 results)  Recent Labs  06/07/15 1551  BNP 81.0     Recent Results (from the past 240 hour(s))  Culture, blood (routine x 2)     Status: None (Preliminary result)   Collection Time: 06/07/15  3:51 PM  Result Value Ref Range Status   Specimen Description BLOOD LEFT ANTECUBITAL  Final   Special Requests BOTTLES DRAWN AEROBIC AND ANAEROBIC  6CC  Final   Culture NO GROWTH 2 DAYS  Final   Report Status PENDING  Incomplete  Urine culture  Status: Abnormal   Collection Time: 06/07/15  4:51 PM  Result Value Ref Range Status   Specimen Description URINE, RANDOM  Final   Special Requests NONE  Final   Culture MULTIPLE SPECIES PRESENT, SUGGEST RECOLLECTION (A)  Final   Report Status 06/09/2015 FINAL  Final  Culture, blood (routine x 2)     Status: None (Preliminary result)   Collection Time: 06/07/15  6:43 PM  Result Value Ref Range Status   Specimen Description BLOOD LEFT ASSIST CONTROL  Final   Special Requests BOTTLES DRAWN AEROBIC AND ANAEROBIC 5 CC  Final   Culture NO GROWTH 2 DAYS  Final   Report Status PENDING  Incomplete     Studies: Dg Chest 2 View  06/07/2015  CLINICAL DATA:  Cough and shortness of breath. Upper  respiratory infection. Asthma and hypertension. EXAM: CHEST  2 VIEW COMPARISON:  03/16/2015.  Chest CT dated 03/03/2015. FINDINGS: Normal sized heart. Large hiatal hernia without significant change. Interval rounded area of patchy opacity in the left lower lung zone, minimal patchy opacity in the right mid lung zone and linear density at the right lung base. Diffuse osteopenia. IMPRESSION: 1. Interval mild rounded pneumonia in the left lower lung zone and minimal pneumonia in the right mid lung zone. 2. Interval mild right basilar linear atelectasis. 3. Stable large hiatal hernia. Electronically Signed   By: Claudie Revering M.D.   On: 06/07/2015 16:40   Ct Head Wo Contrast  06/07/2015  CLINICAL DATA:  Weakness EXAM: CT HEAD WITHOUT CONTRAST TECHNIQUE: Contiguous axial images were obtained from the base of the skull through the vertex without intravenous contrast. COMPARISON:  01/03/2013 FINDINGS: Bony calvarium demonstrates prior mastoidectomy these and cochlear implants. These are stable. No acute bony abnormality is seen. Considerable scatter artifact from the implants is seen. A stable calcified falcine meningioma is noted eccentric to the right. No findings to suggest acute hemorrhage, acute infarction or space-occupying mass lesion are noted. A left basal ganglia lacunar infarct is noted. IMPRESSION: Chronic changes without acute abnormality. Electronically Signed   By: Inez Catalina M.D.   On: 06/07/2015 16:18    Scheduled Meds: . acetylcysteine  3 mL Nebulization BID  . azithromycin  250 mg Oral Daily  . budesonide (PULMICORT) nebulizer solution  0.25 mg Nebulization BID  . cefTRIAXone (ROCEPHIN)  IV  1 g Intravenous Q24H  . diltiazem  180 mg Oral Daily  . gabapentin  600 mg Oral BID  . ipratropium-albuterol  3 mL Nebulization Q6H  . montelukast  10 mg Oral QHS  . pantoprazole  40 mg Oral Daily  . pramipexole  0.5 mg Oral BID  . predniSONE  40 mg Oral Q breakfast  . rivaroxaban  20 mg Oral Q  supper  . simvastatin  20 mg Oral q1800   Assessment/Plan:  1. Right middle lobe pneumonia and leukocytosis. Looks like patient was recently on Levaquin from last hospital discharge. Was on doxycycline as outpatient by PCP. Patient started on Rocephin and Zithromax while here. Add Mucomyst nebulizers and Robitussin cough syrup. Add oral prednisone. 2. Lower extremity blood clots and ischemic limb on Xarelto 3. Essential hypertension. Blood pressure on the lower side will hold diltiazem at this point. 4. Anemia of chronic disease 5. Gastroesophageal reflux disease on Protonix 6. Hyperlipidemia unspecified on simvastatin 7. Weakness. Physical therapy recommended rehabilitation. Patient needs at least a 3 night stay.  Code Status:     Code Status Orders  Start     Ordered   06/07/15 2008  Full code   Continuous     06/07/15 2007    Code Status History    Date Active Date Inactive Code Status Order ID Comments User Context   05/22/2015  1:52 PM 05/24/2015  4:12 PM Full Code [id number]  Algernon Huxley, MD Inpatient   03/11/2015  6:20 PM 03/16/2015  2:15 PM Full Code [id number]  Fritzi Mandes, MD Inpatient   02/27/2015  3:53 PM 03/03/2015  6:24 PM Full Code [id number]  Sela Hua, PA-C Inpatient    Advance Directive Documentation        Most Recent Value   Type of Advance Directive  Living will   Pre-existing out of facility DNR order (yellow form or pink MOST form)     "MOST" Form in Place?       Disposition Plan: Potential rehabilitation tomorrow or the next day.  Antibiotics:  Rocephin  Zithromax  Time spent: 25 minutes  Loletha Grayer  [employer]

## 2015-06-09 NOTE — Care Management (Signed)
Admitted to [facility] with the diagnosis of pneumonia. Lives with husband, Evette Doffing [phone]). [facility] x 21 years.  Last seen Dr. Sabra Heck Tuesday of last week. Uses oxygen 2 liters at night per nasal cannula. O2 provided by [facility] x 5 years. Sometimes uses O2 during the day, if needed. Cochlear implants 2years (right) and 6 years (left). Home Health provided by [facility] in the past. [place] 2015. Uses a cane and rolling walker to aid in ambulation. Takes care of all basic activities of daily living herself, drives. Good appetite. No falls.  Physician therapy evaluation completed. Recommends skilled nursing facility. Agrees to return to the [facility] when stable Shelbie Ammons RN MSN [facility] [phone]

## 2015-06-10 LAB — CBC
HEMATOCRIT: 25.6 % — AB (ref 35.0–47.0)
HEMOGLOBIN: 7.9 g/dL — AB (ref 12.0–16.0)
MCH: 24 pg — AB (ref 26.0–34.0)
MCHC: 31.1 g/dL — AB (ref 32.0–36.0)
MCV: 77.2 fL — AB (ref 80.0–100.0)
Platelets: 301 10*3/uL (ref 150–440)
RBC: 3.31 MIL/uL — ABNORMAL LOW (ref 3.80–5.20)
RDW: 28 % — AB (ref 11.5–14.5)
WBC: 9.1 10*3/uL (ref 3.6–11.0)

## 2015-06-10 LAB — VITAMIN B12: Vitamin B-12: 158 pg/mL — ABNORMAL LOW (ref 180–914)

## 2015-06-10 LAB — FERRITIN: Ferritin: 52 ng/mL (ref 11–307)

## 2015-06-10 MED ORDER — POLYETHYLENE GLYCOL 3350 17 G PO PACK
17.0000 g | PACK | Freq: Every day | ORAL | Status: DC
  Administered 2015-06-10 – 2015-06-11 (×2): 17 g via ORAL
  Filled 2015-06-10 (×2): qty 1

## 2015-06-10 MED ORDER — ACETYLCYSTEINE 20 % IN SOLN
3.0000 mL | Freq: Two times a day (BID) | RESPIRATORY_TRACT | Status: DC
  Administered 2015-06-10 (×2): 4 mL via RESPIRATORY_TRACT
  Administered 2015-06-10: 3 mL via RESPIRATORY_TRACT
  Administered 2015-06-11: 4 mL via RESPIRATORY_TRACT
  Filled 2015-06-10 (×3): qty 4

## 2015-06-10 NOTE — Progress Notes (Signed)
Patient ID: Natasha [name], female   DOB: [birth year], 82 y.o.   MRN: [id number] [facility] PROGRESS NOTE  Natasha [name] [id number] DOB: [birth year] DOA: 06/07/2015 PCP: Rusty Aus, MD  HPI/Subjective: Patient feeling better today than yesterday. Feels like she is moving better air. Still coughing but unable to get anything up.  Objective: Filed Vitals:   06/10/15 0439 06/10/15 1312  BP: 143/58 141/46  Pulse: 82 93  Temp: 98 F (36.7 C) 98.2 F (36.8 C)  Resp: 16     Filed Weights   06/07/15 1538 06/07/15 2010  Weight: 67.586 kg (149 lb) 69.582 kg (153 lb 6.4 oz)    ROS: Review of Systems  Constitutional: Negative for fever and chills.  Eyes: Negative for blurred vision.  Respiratory: Positive for cough and shortness of breath.   Cardiovascular: Negative for chest pain.  Gastrointestinal: Negative for nausea, vomiting, abdominal pain, diarrhea and constipation.  Genitourinary: Negative for dysuria.  Musculoskeletal: Positive for joint pain.  Neurological: Positive for weakness. Negative for dizziness and headaches.   Exam: Physical Exam  Constitutional: She is oriented to person, place, and time.  HENT:  Nose: No mucosal edema.  Mouth/Throat: No oropharyngeal exudate or posterior oropharyngeal edema.  Eyes: Conjunctivae, EOM and lids are normal. Pupils are equal, round, and reactive to light.  Neck: No JVD present. Carotid bruit is not present. No edema present. No thyroid mass and no thyromegaly present.  Cardiovascular: S1 normal and S2 normal.  Exam reveals no gallop.   No murmur heard. Pulses:      Dorsalis pedis pulses are 2+ on the right side, and 2+ on the left side.  Respiratory: No respiratory distress. She has no wheezes. She has rhonchi in the right lower field. She has no rales.  GI: Soft. Bowel sounds are normal. There is no tenderness.  Musculoskeletal:       Right ankle: She exhibits swelling.       Left ankle: She exhibits  swelling.  Lymphadenopathy:    She has no cervical adenopathy.  Neurological: She is alert and oriented to person, place, and time. No cranial nerve deficit.  Skin: Skin is warm. No rash noted. Nails show no clubbing.  Psychiatric: She has a normal mood and affect.      Data Reviewed: Basic Metabolic Panel:  Recent Labs Lab 06/07/15 1551 06/08/15 0409  NA 138 137  K 3.2* 3.9  CL 97* 97*  CO2 31 32  GLUCOSE 138* 88  BUN 26* 17  CREATININE 0.75 0.76  CALCIUM 9.4 8.6*   Liver Function Tests:  Recent Labs Lab 06/07/15 1551  AST 19  ALT 16  ALKPHOS 67  BILITOT 0.4  PROT 7.0  ALBUMIN 3.7   CBC:  Recent Labs Lab 06/07/15 1551 06/10/15 0424  WBC 15.4* 9.1  NEUTROABS 12.8*  --   HGB 9.3* 7.9*  HCT 29.4* 25.6*  MCV 75.7* 77.2*  PLT 446* 301   Cardiac Enzymes:  Recent Labs Lab 06/07/15 1551  TROPONINI <0.03   BNP (last 3 results)  Recent Labs  06/07/15 1551  BNP 81.0     Recent Results (from the past 240 hour(s))  Culture, blood (routine x 2)     Status: None (Preliminary result)   Collection Time: 06/07/15  3:51 PM  Result Value Ref Range Status   Specimen Description BLOOD LEFT ANTECUBITAL  Final   Special Requests BOTTLES DRAWN AEROBIC AND ANAEROBIC  6CC  Final   Culture NO GROWTH 3  DAYS  Final   Report Status PENDING  Incomplete  Urine culture     Status: Abnormal   Collection Time: 06/07/15  4:51 PM  Result Value Ref Range Status   Specimen Description URINE, RANDOM  Final   Special Requests NONE  Final   Culture MULTIPLE SPECIES PRESENT, SUGGEST RECOLLECTION (A)  Final   Report Status 06/09/2015 FINAL  Final  Culture, blood (routine x 2)     Status: None (Preliminary result)   Collection Time: 06/07/15  6:43 PM  Result Value Ref Range Status   Specimen Description BLOOD LEFT ASSIST CONTROL  Final   Special Requests BOTTLES DRAWN AEROBIC AND ANAEROBIC 5 CC  Final   Culture NO GROWTH 3 DAYS  Final   Report Status PENDING  Incomplete      Studies: No results found.  Scheduled Meds: . acetylcysteine  3 mL Nebulization BID  . azithromycin  250 mg Oral Daily  . budesonide (PULMICORT) nebulizer solution  0.25 mg Nebulization BID  . cefTRIAXone (ROCEPHIN)  IV  1 g Intravenous Q24H  . gabapentin  600 mg Oral BID  . ipratropium-albuterol  3 mL Nebulization Q6H  . montelukast  10 mg Oral QHS  . pantoprazole  40 mg Oral Daily  . polyethylene glycol  17 g Oral Daily  . pramipexole  0.5 mg Oral BID  . predniSONE  40 mg Oral Q breakfast  . rivaroxaban  20 mg Oral Q supper  . simvastatin  20 mg Oral q1800   Assessment/Plan:  1. Right middle lobe pneumonia and leukocytosis. Looks like patient was recently on Levaquin from last hospital discharge. Was on doxycycline as outpatient by PCP. Patient started on Rocephin and Zithromax while here. Add Mucomyst nebulizers and Robitussin cough syrup. Add oral prednisone.Potential discharge tomorrow. I added Mucomyst nebulizers. 2. Lower extremity blood clots and ischemic limb on Xarelto 3. Essential hypertension. Blood pressure on the lower side will hold diltiazem at this point. 4. Anemia of chronic disease. Drop in hemoglobin. I'll check a hemoglobin tomorrow. Continue iron. 5. Gastroesophageal reflux disease on Protonix 6. Hyperlipidemia unspecified on simvastatin 7. Weakness. Physical therapy recommended rehabilitation. Patient needs at least a 3 night stay.  Code Status:     Code Status Orders        Start     Ordered   06/07/15 2008  Full code   Continuous     06/07/15 2007    Code Status History    Date Active Date Inactive Code Status Order ID Comments User Context   05/22/2015  1:52 PM 05/24/2015  4:12 PM Full Code [id number]  Algernon Huxley, MD Inpatient   03/11/2015  6:20 PM 03/16/2015  2:15 PM Full Code [id number]  Fritzi Mandes, MD Inpatient   02/27/2015  3:53 PM 03/03/2015  6:24 PM Full Code [id number]  Sela Hua, PA-C Inpatient    Advance Directive Documentation         Most Recent Value   Type of Advance Directive  Living will   Pre-existing out of facility DNR order (yellow form or pink MOST form)     "MOST" Form in Place?       Disposition Plan: Potential rehabilitation tomorrow.  Antibiotics:  Rocephin  Zithromax  Time spent: 24 minutes  Loletha Grayer  [employer]

## 2015-06-10 NOTE — Progress Notes (Signed)
Per MD patient is not ready for D/C today. Plan is for patient to D/C to [facility] when medically stable. [facility] admissions coordinator at [facility] is aware of above. Clinical Social Worker (CSW) will continue to follow and assist as needed.   Blima Rich, LCSW [phone]

## 2015-06-11 LAB — CBC
HCT: 23.7 % — ABNORMAL LOW (ref 35.0–47.0)
Hemoglobin: 7.4 g/dL — ABNORMAL LOW (ref 12.0–16.0)
MCH: 24.2 pg — AB (ref 26.0–34.0)
MCHC: 31.3 g/dL — ABNORMAL LOW (ref 32.0–36.0)
MCV: 77.3 fL — ABNORMAL LOW (ref 80.0–100.0)
PLATELETS: 295 10*3/uL (ref 150–440)
RBC: 3.06 MIL/uL — AB (ref 3.80–5.20)
RDW: 28 % — AB (ref 11.5–14.5)
WBC: 9 10*3/uL (ref 3.6–11.0)

## 2015-06-11 LAB — PREPARE RBC (CROSSMATCH)

## 2015-06-11 MED ORDER — DILTIAZEM HCL ER COATED BEADS 180 MG PO CP24: 180.0000 mg | ORAL_CAPSULE | Freq: Every day | ORAL | Status: DC

## 2015-06-11 MED ORDER — TEMAZEPAM 15 MG PO CAPS: 15.0000 mg | ORAL_CAPSULE | Freq: Every evening | ORAL | Status: DC | PRN

## 2015-06-11 MED ORDER — FUROSEMIDE 10 MG/ML IJ SOLN
40.0000 mg | Freq: Once | INTRAMUSCULAR | Status: AC
  Administered 2015-06-11: 10:00:00 40 mg via INTRAVENOUS
  Filled 2015-06-11: qty 4

## 2015-06-11 MED ORDER — PREDNISONE 20 MG PO TABS: 40.0000 mg | ORAL_TABLET | Freq: Every day | ORAL | Status: DC

## 2015-06-11 MED ORDER — POLYETHYLENE GLYCOL 3350 17 G PO PACK: 17.0000 g | PACK | Freq: Every day | ORAL | Status: DC | PRN

## 2015-06-11 MED ORDER — ALBUTEROL SULFATE (2.5 MG/3ML) 0.083% IN NEBU: 2.5000 mg | INHALATION_SOLUTION | Freq: Four times a day (QID) | RESPIRATORY_TRACT | Status: DC | PRN

## 2015-06-11 MED ORDER — CEFUROXIME AXETIL 500 MG PO TABS: 500.0000 mg | ORAL_TABLET | Freq: Two times a day (BID) | ORAL | Status: DC

## 2015-06-11 MED ORDER — SODIUM CHLORIDE 0.9 % IV SOLN
Freq: Once | INTRAVENOUS | Status: AC
  Administered 2015-06-11: 10:00:00 via INTRAVENOUS

## 2015-06-11 NOTE — Clinical Social Work Placement (Signed)
   CLINICAL SOCIAL WORK PLACEMENT  NOTE  Date:  06/11/2015  Patient Details  Name: Natasha [name] MRN: [id number] Date of Birth: 02-Dec-1928  Clinical Social Work is seeking post-discharge placement for this patient at the [place] level of care (*CSW will initial, date and re-position this form in  chart as items are completed):  Yes   Patient/family provided with [facility] Work Department's list of facilities offering this level of care within the geographic area requested by the patient (or if unable, by the patient's family).  Yes   Patient/family informed of their freedom to choose among providers that offer the needed level of care, that participate in Medicare, Medicaid or managed care program needed by the patient, have an available bed and are willing to accept the patient.  Yes   Patient/family informed of [facility]'s ownership interest in [facility] and [facility], as well as of the fact that they are under no obligation to receive care at these facilities.  PASRR submitted to EDS on       PASRR number received on       Existing PASRR number confirmed on 06/08/15     FL2 transmitted to all facilities in geographic area requested by pt/family on 06/08/15     FL2 transmitted to all facilities within larger geographic area on       Patient informed that his/her managed care company has contracts with or will negotiate with certain facilities, including the following:        Yes   Patient/family informed of bed offers received.  Patient chooses bed at [facility]     Physician recommends and patient chooses bed at  [facility])    Patient to be transferred to [facility] on 06/11/15.  Patient to be transferred to facility by [facility]     Patient family notified on 06/11/15 of transfer.  Name of family member notified:  pt declined as she will call her husband     PHYSICIAN       Additional Comment:     _______________________________________________ Darden Dates, LCSW 06/11/2015, 11:27 AM

## 2015-06-11 NOTE — Progress Notes (Signed)
Report called to [facility]-patient received 1 unit of pRBCs today-small bm per pt-waiting on ride at 1600-prescriptions to go with patient.

## 2015-06-11 NOTE — Discharge Instructions (Signed)

## 2015-06-11 NOTE — Care Management Important Message (Signed)
Important Message  Patient Details  Name: Natasha [name] MRN: [id number] Date of Birth: 07-Feb-1928   Medicare Important Message Given:  Yes    Shelbie Ammons, RN 06/11/2015, 9:11 AM

## 2015-06-11 NOTE — Discharge Summary (Signed)
[place] [facility] [place] NAME: Natasha [name]    MR#:  [id number]  DATE OF BIRTH:  [birth year]  DATE OF ADMISSION:  06/07/2015 ADMITTING PHYSICIAN: Fritzi Mandes, MD  DATE OF DISCHARGE: 06/11/2015  PRIMARY CARE PHYSICIAN: Rusty Aus, MD    ADMISSION DIAGNOSIS:  Weakness [R53.1] Aspiration pneumonia of both lower lobes, unspecified aspiration pneumonia type ([place]) [J69.0]  DISCHARGE DIAGNOSIS:  Active Problems:   Pneumonia   SECONDARY DIAGNOSIS:   Past Medical History  Diagnosis Date  . GERD (gastroesophageal reflux disease)   . Asthma   . Anemia   . Arthritis   . H/O blood clots 2014  . Gallstones 2015  . Hypertension   . History of home oxygen therapy     at night  . Bilateral pneumonia may 2017    HOSPITAL COURSE:   1. Right middle lobe pneumonia and leukocytosis. Patient received Rocephin and Zithromax while here. Patient finished Zithromax course. Switch over to Ceftin for completion of course. Patient started getting better when I ordered oral prednisone. I will continue 40 mg of prednisone daily for another 5 days upon discharge today. 2. Lower extremity blood clots and ischemic limb on Xarelto 3. Essential hypertension. Restart diltiazem 4. Anemia of chronic disease. I will transfuse 1 unit of packed red blood cells prior to discharge today. Hemoglobin 7.4 today. Recommend checking hemoglobin on a biweekly basis since the patient is on Xarelto. I will hold aspirin and Pletal at this point 5. Gastroesophageal reflux disease on Protonix 6. Hyperlipidemia unspecified on simvastatin 7. Weakness physical therapy recommended rehabilitation she'll go back to [facility] today in the afternoon  DISCHARGE CONDITIONS:   Satisfactory  CONSULTS OBTAINED:  None  DRUG ALLERGIES:   Allergies  Allergen Reactions  . Codeine Diarrhea  . Hydrocodone Nausea And Vomiting  . Levofloxacin     Other reaction(s): Other (See  Comments) Pt denies, questionable  . Tramadol Nausea And Vomiting    DISCHARGE MEDICATIONS:   Current Discharge Medication List    START taking these medications   Details  albuterol (PROVENTIL) (2.5 MG/3ML) 0.083% nebulizer solution Take 3 mLs (2.5 mg total) by nebulization every 6 (six) hours as needed for wheezing or shortness of breath. Qty: 75 mL, Refills: 0    cefUROXime (CEFTIN) 500 MG tablet Take 1 tablet (500 mg total) by mouth 2 (two) times daily with a meal. Qty: 10 tablet, Refills: 0    polyethylene glycol (MIRALAX / GLYCOLAX) packet Take 17 g by mouth daily as needed. Qty: 14 each, Refills: 0    predniSONE (DELTASONE) 20 MG tablet Take 2 tablets (40 mg total) by mouth daily with breakfast. For 5 days Qty: 10 tablet, Refills: 0      CONTINUE these medications which have CHANGED   Details  temazepam (RESTORIL) 15 MG capsule Take 1 capsule (15 mg total) by mouth at bedtime as needed. for sleep Qty: 30 capsule, Refills: 0      CONTINUE these medications which have NOT CHANGED   Details  albuterol (PROVENTIL HFA;VENTOLIN HFA) 108 (90 BASE) MCG/ACT inhaler Inhale 2 puffs into the lungs 2 (two) times daily as needed for wheezing or shortness of breath.     budesonide (PULMICORT) 180 MCG/ACT inhaler Inhale 2 puffs into the lungs 2 (two) times daily.    Calcium-Magnesium-Vitamin D (CALCIUM 1200+D3 PO) Take 1 tablet by mouth daily.    diltiazem (CARDIZEM CD) 180 MG 24 hr capsule TAKE 1 CAPSULE (180 MG  TOTAL) BY MOUTH ONCE DAILY. Refills: 3    esomeprazole (NEXIUM) 20 MG capsule Take 20 mg by mouth 2 (two) times daily.     ferrous sulfate 325 (65 FE) MG tablet Take 1 tablet (325 mg total) by mouth daily. Qty: 30 tablet, Refills: 3    furosemide (LASIX) 20 MG tablet Take 20 mg by mouth 2 (two) times daily.    gabapentin (NEURONTIN) 300 MG capsule Take 300 mg by mouth 2 (two) times daily.    KLOR-CON M20 20 MEQ tablet Take 20-40 mEq by mouth See admin instructions.  Take 2 tablets (70meq) by mouth every morning, Take 1 tablet by mouth in midday, and take 2 tablets (27meq) by mouth every evening.    Melatonin 5 MG TABS Take 10 mg by mouth at bedtime. Reported on 05/22/2015    montelukast (SINGULAIR) 10 MG tablet Take 10 mg by mouth daily.    OXYGEN Inhale 2 L into the lungs at bedtime.    pramipexole (MIRAPEX) 0.5 MG tablet Take 0.5 mg by mouth 2 (two) times daily.    rivaroxaban (XARELTO) 20 MG TABS tablet Take 20 mg by mouth daily with supper.    simvastatin (ZOCOR) 20 MG tablet Take 20 mg by mouth every evening.       STOP taking these medications     aspirin 325 MG tablet      cilostazol (PLETAL) 100 MG tablet      HYDROmorphone (DILAUDID) 2 MG tablet      levofloxacin (LEVAQUIN) 500 MG tablet          DISCHARGE INSTRUCTIONS:   Follow-up with PMD one week Follow-up with Dr. rehabilitation 1-2 days  If you experience worsening of your admission symptoms, develop shortness of breath, life threatening emergency, suicidal or homicidal thoughts you must seek medical attention immediately by calling 911 or calling your MD immediately  if symptoms less severe.  You Must read complete instructions/literature along with all the possible adverse reactions/side effects for all the Medicines you take and that have been prescribed to you. Take any new Medicines after you have completely understood and accept all the possible adverse reactions/side effects.   Please note  You were cared for by a hospitalist during your hospital stay. If you have any questions about your discharge medications or the care you received while you were in the hospital after you are discharged, you can call the unit and asked to speak with the hospitalist on call if the hospitalist that took care of you is not available. Once you are discharged, your primary care physician will handle any further medical issues. Please note that NO REFILLS for any discharge medications  will be authorized once you are discharged, as it is imperative that you return to your primary care physician (or establish a relationship with a primary care physician if you do not have one) for your aftercare needs so that they can reassess your need for medications and monitor your lab values.    Today   CHIEF COMPLAINT:   Chief Complaint  Patient presents with  . Shortness of Breath    HISTORY OF PRESENT ILLNESS:  Natasha [name]  is a 82 y.o. female with a known history of Asthma presents with shortness of breath. Found to have pneumonia.   VITAL SIGNS:  Blood pressure 147/58, pulse 86, temperature 97.4 F (36.3 C), temperature source Oral, resp. rate 17, height 5\' 3"  (1.6 m), weight 69.582 kg (153 lb 6.4 oz), SpO2 97 %.  PHYSICAL EXAMINATION:  GENERAL:  82 y.o.-year-old patient lying in the bed with no acute distress.  EYES: Pupils equal, round, reactive to light and accommodation. No scleral icterus. Extraocular muscles intact.  HEENT: Head atraumatic, normocephalic. Oropharynx and nasopharynx clear.  NECK:  Supple, no jugular venous distention. No thyroid enlargement, no tenderness.  LUNGS: Decreased breath sounds bilaterally, no wheezing, rales,rhonchi or crepitation. No use of accessory muscles of respiration.  CARDIOVASCULAR: S1, S2 normal. No murmurs, rubs, or gallops.  ABDOMEN: Soft, non-tender, non-distended. Bowel sounds present. No organomegaly or mass.  EXTREMITIES: No pedal edema, cyanosis, or clubbing.  NEUROLOGIC: Cranial nerves II through XII are intact. Muscle strength 5/5 in all extremities. Sensation intact. Gait not checked.  PSYCHIATRIC: The patient is alert and oriented x 3.  SKIN: No obvious rash, lesion, or ulcer.   DATA REVIEW:   CBC  Recent Labs Lab 06/11/15 0624  WBC 9.0  HGB 7.4*  HCT 23.7*  PLT 295    Chemistries   Recent Labs Lab 06/07/15 1551 06/08/15 0409  NA 138 137  K 3.2* 3.9  CL 97* 97*  CO2 31 32  GLUCOSE 138*  88  BUN 26* 17  CREATININE 0.75 0.76  CALCIUM 9.4 8.6*  AST 19  --   ALT 16  --   ALKPHOS 67  --   BILITOT 0.4  --     Cardiac Enzymes  Recent Labs Lab 06/07/15 1551  TROPONINI <0.03    Microbiology Results  Results for orders placed or performed during the hospital encounter of 06/07/15  Culture, blood (routine x 2)     Status: None (Preliminary result)   Collection Time: 06/07/15  3:51 PM  Result Value Ref Range Status   Specimen Description BLOOD LEFT ANTECUBITAL  Final   Special Requests BOTTLES DRAWN AEROBIC AND ANAEROBIC  6CC  Final   Culture NO GROWTH 3 DAYS  Final   Report Status PENDING  Incomplete  Urine culture     Status: Abnormal   Collection Time: 06/07/15  4:51 PM  Result Value Ref Range Status   Specimen Description URINE, RANDOM  Final   Special Requests NONE  Final   Culture MULTIPLE SPECIES PRESENT, SUGGEST RECOLLECTION (A)  Final   Report Status 06/09/2015 FINAL  Final  Culture, blood (routine x 2)     Status: None (Preliminary result)   Collection Time: 06/07/15  6:43 PM  Result Value Ref Range Status   Specimen Description BLOOD LEFT ASSIST CONTROL  Final   Special Requests BOTTLES DRAWN AEROBIC AND ANAEROBIC 5 CC  Final   Culture NO GROWTH 3 DAYS  Final   Report Status PENDING  Incomplete   Management plans discussed with the patient, And she is in agreement.  CODE STATUS:     Code Status Orders        Start     Ordered   06/07/15 2008  Full code   Continuous     06/07/15 2007    Code Status History    Date Active Date Inactive Code Status Order ID Comments User Context   05/22/2015  1:52 PM 05/24/2015  4:12 PM Full Code [id number]  Algernon Huxley, MD Inpatient   03/11/2015  6:20 PM 03/16/2015  2:15 PM Full Code [id number]  Fritzi Mandes, MD Inpatient   02/27/2015  3:53 PM 03/03/2015  6:24 PM Full Code [id number]  Sela Hua, PA-C Inpatient    Advance Directive Documentation        Most Recent  Value   Type of Advance Directive  Living  will   Pre-existing out of facility DNR order (yellow form or pink MOST form)     "MOST" Form in Place?        TOTAL TIME TAKING CARE OF THIS PATIENT: 35 minutes.    Loletha Grayer M.D on 06/11/2015 at 8:05 AM  Between 7am to 6pm - Pager - [phone]  After 6pm go to [email or web address] - password [email or web address]  [facility] Office  [phone]  CC: Primary care physician; Rusty Aus, MD

## 2015-06-11 NOTE — Clinical Social Work Note (Signed)
Pt is ready for discharge today to [facility]. Pt will receive blood prior to discharge. [facility] has received discharge information and is ready to admit pt. Facility will provide transportation to healthcare as pt is a campus resident. Pt shared that she will call her husband. RN will call report. CSW is signing off as no further needs identified.   Darden Dates, MSW, LCSW Clinical Social Worker  [phone]

## 2015-06-11 NOTE — Care Management (Signed)
Spoke with Dr. Leslye Peer. Will discharge to [facility] for rehabilitation today after receiving a blood transfusion. Shelbie Ammons RN MSN CCM Care Management [phone]

## 2015-06-12 LAB — TYPE AND SCREEN
ABO/RH(D): A POS
Antibody Screen: NEGATIVE
Unit division: 0

## 2015-06-13 DIAGNOSIS — I739 Peripheral vascular disease, unspecified: Secondary | ICD-10-CM

## 2015-06-13 DIAGNOSIS — I10 Essential (primary) hypertension: Secondary | ICD-10-CM | POA: Diagnosis not present

## 2015-06-13 DIAGNOSIS — J45901 Unspecified asthma with (acute) exacerbation: Secondary | ICD-10-CM | POA: Diagnosis not present

## 2015-06-13 DIAGNOSIS — J189 Pneumonia, unspecified organism: Secondary | ICD-10-CM | POA: Diagnosis not present

## 2015-06-13 DIAGNOSIS — J9601 Acute respiratory failure with hypoxia: Secondary | ICD-10-CM | POA: Diagnosis not present

## 2015-06-16 ENCOUNTER — Ambulatory Visit: Payer: Medicare Other | Admitting: Podiatry

## 2015-06-19 LAB — CULTURE, BLOOD (ROUTINE X 2)
CULTURE: NO GROWTH
CULTURE: NO GROWTH

## 2015-06-27 ENCOUNTER — Inpatient Hospital Stay
Admission: EM | Admit: 2015-06-27 | Discharge: 2015-06-30 | DRG: 193 | Disposition: A | Discharge status: Home / Self Care | Payer: Medicare Other | Attending: Internal Medicine | Admitting: Internal Medicine

## 2015-06-27 ENCOUNTER — Emergency Department: Payer: Medicare Other

## 2015-06-27 DIAGNOSIS — H9190 Unspecified hearing loss, unspecified ear: Secondary | ICD-10-CM | POA: Diagnosis present

## 2015-06-27 DIAGNOSIS — Z9981 Dependence on supplemental oxygen: Secondary | ICD-10-CM | POA: Diagnosis not present

## 2015-06-27 DIAGNOSIS — D638 Anemia in other chronic diseases classified elsewhere: Secondary | ICD-10-CM | POA: Diagnosis present

## 2015-06-27 DIAGNOSIS — Z8249 Family history of ischemic heart disease and other diseases of the circulatory system: Secondary | ICD-10-CM

## 2015-06-27 DIAGNOSIS — Z7952 Long term (current) use of systemic steroids: Secondary | ICD-10-CM

## 2015-06-27 DIAGNOSIS — K449 Diaphragmatic hernia without obstruction or gangrene: Secondary | ICD-10-CM | POA: Diagnosis present

## 2015-06-27 DIAGNOSIS — I10 Essential (primary) hypertension: Secondary | ICD-10-CM | POA: Diagnosis present

## 2015-06-27 DIAGNOSIS — K219 Gastro-esophageal reflux disease without esophagitis: Secondary | ICD-10-CM | POA: Diagnosis present

## 2015-06-27 DIAGNOSIS — Z87891 Personal history of nicotine dependence: Secondary | ICD-10-CM

## 2015-06-27 DIAGNOSIS — J189 Pneumonia, unspecified organism: Secondary | ICD-10-CM | POA: Diagnosis present

## 2015-06-27 DIAGNOSIS — Z885 Allergy status to narcotic agent status: Secondary | ICD-10-CM | POA: Diagnosis not present

## 2015-06-27 DIAGNOSIS — R9431 Abnormal electrocardiogram [ECG] [EKG]: Secondary | ICD-10-CM | POA: Diagnosis present

## 2015-06-27 DIAGNOSIS — Y95 Nosocomial condition: Secondary | ICD-10-CM | POA: Diagnosis present

## 2015-06-27 DIAGNOSIS — E785 Hyperlipidemia, unspecified: Secondary | ICD-10-CM | POA: Diagnosis present

## 2015-06-27 DIAGNOSIS — J9621 Acute and chronic respiratory failure with hypoxia: Secondary | ICD-10-CM | POA: Diagnosis present

## 2015-06-27 DIAGNOSIS — Z7901 Long term (current) use of anticoagulants: Secondary | ICD-10-CM

## 2015-06-27 DIAGNOSIS — Z803 Family history of malignant neoplasm of breast: Secondary | ICD-10-CM

## 2015-06-27 DIAGNOSIS — Z7951 Long term (current) use of inhaled steroids: Secondary | ICD-10-CM

## 2015-06-27 DIAGNOSIS — J45909 Unspecified asthma, uncomplicated: Secondary | ICD-10-CM | POA: Diagnosis present

## 2015-06-27 LAB — BASIC METABOLIC PANEL WITH GFR
Anion gap: 9 (ref 5–15)
BUN: 16 mg/dL (ref 6–20)
CO2: 30 mmol/L (ref 22–32)
Calcium: 9 mg/dL (ref 8.9–10.3)
Chloride: 98 mmol/L — ABNORMAL LOW (ref 101–111)
Creatinine, Ser: 0.92 mg/dL (ref 0.44–1.00)
GFR calc Af Amer: >60 mL/min (ref >60)
GFR calc non Af Amer: 56 mL/min — ABNORMAL LOW (ref >60)
Glucose, Bld: 100 mg/dL — ABNORMAL HIGH (ref 65–99)
Potassium: 4.3 mmol/L (ref 3.5–5.1)
Sodium: 137 mmol/L (ref 135–145)

## 2015-06-27 LAB — CBC WITH DIFFERENTIAL/PLATELET
Basophils Absolute: 0.1 K/uL (ref 0–0.1)
Basophils Relative: 1 %
Eosinophils Absolute: 0 K/uL (ref 0–0.7)
Eosinophils Relative: 0 %
HCT: 32.2 % — ABNORMAL LOW (ref 35.0–47.0)
Hemoglobin: 10.3 g/dL — ABNORMAL LOW (ref 12.0–16.0)
Lymphocytes Relative: 8 %
Lymphs Abs: 0.9 K/uL — ABNORMAL LOW (ref 1.0–3.6)
MCH: 26.3 pg (ref 26.0–34.0)
MCHC: 32 g/dL (ref 32.0–36.0)
MCV: 82.2 fL (ref 80.0–100.0)
Monocytes Absolute: 0.5 K/uL (ref 0.2–0.9)
Monocytes Relative: 5 %
Neutro Abs: 9.4 K/uL — ABNORMAL HIGH (ref 1.4–6.5)
Neutrophils Relative %: 86 %
Platelets: 209 K/uL (ref 150–440)
RBC: 3.92 MIL/uL (ref 3.80–5.20)
RDW: 28 % — ABNORMAL HIGH (ref 11.5–14.5)
WBC: 10.9 K/uL (ref 3.6–11.0)

## 2015-06-27 LAB — LACTIC ACID, PLASMA
LACTIC ACID, VENOUS: 3.4 mmol/L — AB (ref 0.5–2.0)
Lactic Acid, Venous: 1.7 mmol/L (ref 0.5–2.0)

## 2015-06-27 LAB — TROPONIN I: Troponin I: <0.03 ng/mL (ref <0.031)

## 2015-06-27 MED ORDER — ALBUTEROL SULFATE (2.5 MG/3ML) 0.083% IN NEBU: 2.5000 mg | INHALATION_SOLUTION | Freq: Two times a day (BID) | RESPIRATORY_TRACT | Status: DC | PRN

## 2015-06-27 MED ORDER — PRAMIPEXOLE DIHYDROCHLORIDE 0.25 MG PO TABS
0.2500 mg | ORAL_TABLET | Freq: Two times a day (BID) | ORAL | Status: DC
  Administered 2015-06-27 – 2015-06-30 (×6): 0.25 mg via ORAL
  Filled 2015-06-27 (×7): qty 1

## 2015-06-27 MED ORDER — ASPIRIN 325 MG PO TABS
325.0000 mg | ORAL_TABLET | Freq: Every day | ORAL | Status: DC
  Administered 2015-06-28 – 2015-06-29 (×2): 325 mg via ORAL
  Filled 2015-06-27 (×2): qty 1

## 2015-06-27 MED ORDER — MONTELUKAST SODIUM 10 MG PO TABS
10.0000 mg | ORAL_TABLET | Freq: Every day | ORAL | Status: DC
  Administered 2015-06-28 – 2015-06-30 (×3): 10 mg via ORAL
  Filled 2015-06-27 (×3): qty 1

## 2015-06-27 MED ORDER — DILTIAZEM HCL ER COATED BEADS 180 MG PO CP24
180.0000 mg | ORAL_CAPSULE | Freq: Every day | ORAL | Status: DC
  Administered 2015-06-28 – 2015-06-30 (×3): 180 mg via ORAL
  Filled 2015-06-27 (×3): qty 1

## 2015-06-27 MED ORDER — GLUCOSAMINE-CHONDROITIN 500-400 MG PO TABS: 1.0000 | ORAL_TABLET | Freq: Every day | ORAL | Status: DC

## 2015-06-27 MED ORDER — SODIUM CHLORIDE 0.9 % IV SOLN
INTRAVENOUS | Status: DC
  Administered 2015-06-27: 17:00:00 via INTRAVENOUS

## 2015-06-27 MED ORDER — RIVAROXABAN 20 MG PO TABS
20.0000 mg | ORAL_TABLET | Freq: Every day | ORAL | Status: DC
  Administered 2015-06-27 – 2015-06-30 (×4): 20 mg via ORAL
  Filled 2015-06-27 (×4): qty 1

## 2015-06-27 MED ORDER — MELATONIN 5 MG PO TABS
5.0000 mg | ORAL_TABLET | Freq: Every day | ORAL | Status: DC
  Administered 2015-06-27 – 2015-06-29 (×3): 5 mg via ORAL
  Filled 2015-06-27 (×7): qty 1

## 2015-06-27 MED ORDER — ACETAMINOPHEN 325 MG PO TABS: 650.0000 mg | ORAL_TABLET | Freq: Four times a day (QID) | ORAL | Status: DC | PRN

## 2015-06-27 MED ORDER — BUDESONIDE 0.25 MG/2ML IN SUSP
2.0000 mL | Freq: Two times a day (BID) | RESPIRATORY_TRACT | Status: DC
  Administered 2015-06-27 – 2015-06-30 (×6): 0.25 mg via RESPIRATORY_TRACT
  Filled 2015-06-27 (×6): qty 2

## 2015-06-27 MED ORDER — GABAPENTIN 300 MG PO CAPS
300.0000 mg | ORAL_CAPSULE | Freq: Two times a day (BID) | ORAL | Status: DC
  Administered 2015-06-27 – 2015-06-30 (×6): 300 mg via ORAL
  Filled 2015-06-27 (×6): qty 1

## 2015-06-27 MED ORDER — PANTOPRAZOLE SODIUM 40 MG PO TBEC
40.0000 mg | DELAYED_RELEASE_TABLET | Freq: Every day | ORAL | Status: DC
  Administered 2015-06-28: 40 mg via ORAL
  Filled 2015-06-27: qty 1

## 2015-06-27 MED ORDER — CALCIUM CARBONATE-VITAMIN D 500-200 MG-UNIT PO TABS
1.0000 | ORAL_TABLET | Freq: Every day | ORAL | Status: DC
  Administered 2015-06-28 – 2015-06-30 (×3): 1 via ORAL
  Filled 2015-06-27 (×3): qty 1

## 2015-06-27 MED ORDER — DEXTROSE 5 % IV SOLN
2.0000 g | Freq: Three times a day (TID) | INTRAVENOUS | Status: DC
  Administered 2015-06-27 (×2): 2 g via INTRAVENOUS
  Filled 2015-06-27 (×4): qty 2

## 2015-06-27 MED ORDER — ACETAMINOPHEN 650 MG RE SUPP
650.0000 mg | Freq: Four times a day (QID) | RECTAL | Status: DC | PRN
Start: 2015-06-27 — End: 2015-06-30

## 2015-06-27 MED ORDER — FERROUS SULFATE 325 (65 FE) MG PO TABS
325.0000 mg | ORAL_TABLET | Freq: Every day | ORAL | Status: DC
  Administered 2015-06-27 – 2015-06-30 (×4): 325 mg via ORAL
  Filled 2015-06-27 (×4): qty 1

## 2015-06-27 MED ORDER — VANCOMYCIN HCL IN DEXTROSE 750-5 MG/150ML-% IV SOLN
750.0000 mg | INTRAVENOUS | Status: DC
  Administered 2015-06-27 – 2015-06-28 (×2): 750 mg via INTRAVENOUS
  Filled 2015-06-27 (×3): qty 150

## 2015-06-27 MED ORDER — PIPERACILLIN-TAZOBACTAM 3.375 G IVPB
3.3750 g | Freq: Three times a day (TID) | INTRAVENOUS | Status: DC
  Administered 2015-06-27 – 2015-06-30 (×8): 3.375 g via INTRAVENOUS
  Filled 2015-06-27 (×10): qty 50

## 2015-06-27 MED ORDER — VANCOMYCIN HCL IN DEXTROSE 1-5 GM/200ML-% IV SOLN
1000.0000 mg | Freq: Once | INTRAVENOUS | Status: AC
  Administered 2015-06-27: 1000 mg via INTRAVENOUS
  Filled 2015-06-27: qty 200

## 2015-06-27 MED ORDER — POTASSIUM CHLORIDE CRYS ER 20 MEQ PO TBCR
20.0000 meq | EXTENDED_RELEASE_TABLET | Freq: Three times a day (TID) | ORAL | Status: DC
  Administered 2015-06-27 – 2015-06-28 (×2): 20 meq via ORAL
  Administered 2015-06-28: 09:00:00 40 meq via ORAL
  Administered 2015-06-28 – 2015-06-29 (×2): 20 meq via ORAL
  Administered 2015-06-29: 16:00:00 40 meq via ORAL
  Administered 2015-06-29 – 2015-06-30 (×2): 20 meq via ORAL
  Filled 2015-06-27: qty 2
  Filled 2015-06-27 (×5): qty 1
  Filled 2015-06-27: qty 2
  Filled 2015-06-27: qty 1

## 2015-06-27 MED ORDER — ALBUTEROL SULFATE (2.5 MG/3ML) 0.083% IN NEBU
2.5000 mg | INHALATION_SOLUTION | Freq: Four times a day (QID) | RESPIRATORY_TRACT | Status: DC | PRN
  Administered 2015-06-28 – 2015-06-30 (×4): 2.5 mg via RESPIRATORY_TRACT
  Filled 2015-06-27 (×4): qty 3

## 2015-06-27 MED ORDER — ONDANSETRON HCL 4 MG PO TABS: 4.0000 mg | ORAL_TABLET | Freq: Four times a day (QID) | ORAL | Status: DC | PRN

## 2015-06-27 MED ORDER — SENNOSIDES-DOCUSATE SODIUM 8.6-50 MG PO TABS: 1.0000 | ORAL_TABLET | Freq: Every evening | ORAL | Status: DC | PRN

## 2015-06-27 MED ORDER — SODIUM CHLORIDE 0.9% FLUSH
3.0000 mL | Freq: Two times a day (BID) | INTRAVENOUS | Status: DC
  Administered 2015-06-29: 3 mL via INTRAVENOUS

## 2015-06-27 MED ORDER — ATORVASTATIN CALCIUM 10 MG PO TABS
10.0000 mg | ORAL_TABLET | Freq: Every day | ORAL | Status: DC
  Administered 2015-06-27 – 2015-06-29 (×3): 10 mg via ORAL
  Filled 2015-06-27 (×3): qty 1

## 2015-06-27 MED ORDER — ONDANSETRON HCL 4 MG/2ML IJ SOLN: 4.0000 mg | Freq: Four times a day (QID) | INTRAMUSCULAR | Status: DC | PRN

## 2015-06-27 MED ORDER — FUROSEMIDE 20 MG PO TABS
20.0000 mg | ORAL_TABLET | Freq: Two times a day (BID) | ORAL | Status: DC
  Administered 2015-06-28 – 2015-06-30 (×5): 20 mg via ORAL
  Filled 2015-06-27 (×5): qty 1

## 2015-06-27 NOTE — ED Notes (Signed)
Pt from [facility] via EMS, reports difficulty breathing, was here 2 days ago with pneumonia, was on 2L at [facility] at 86%, placed on 4L by EMS. Crackles in lower lobes upon assessment

## 2015-06-27 NOTE — H&P (Signed)
[place] at [place] NAME: Natasha [name]    MR#:  [id number]  DATE OF BIRTH:  11-07-1928  DATE OF ADMISSION:  06/27/2015  PRIMARY CARE PHYSICIAN: Rusty Aus, MD   REQUESTING/REFERRING PHYSICIAN: After Archie Balboa  CHIEF COMPLAINT:   Shortness of breath HISTORY OF PRESENT ILLNESS:  Natasha [name]  is a 82 y.o. female with a known history of Hypertension who wears oxygen at night when necessary presents today with shortness of breath. Patient reports since yesterday she has had increasing shortness of breath and cough. She also had chills this morning. Her husband reports that her temperature was 100.6 this morning. She has been started on antibiotics to treat HCAP in the emergency room.  PAST MEDICAL HISTORY:   Past Medical History  Diagnosis Date  . GERD (gastroesophageal reflux disease)   . Asthma   . Anemia   . Arthritis   . H/O blood clots 2014  . Gallstones 2015  . Hypertension   . History of home oxygen therapy     at night  . Bilateral pneumonia may 2017    PAST SURGICAL HISTORY:   Past Surgical History  Procedure Laterality Date  . Abdominal hysterectomy  1960  . Appendectomy  1946  . Cochlear implant  2009  . Eye surgery  [phone]    cataract  . Stent placement  2006-2014    multiple stent placements in legs  . Colonoscopy  2015    Dr. Rayann Heman   . Cholecystectomy  04-24-13  . Breast biopsy Bilateral     negative  . Peripheral vascular catheterization N/A 02/27/2015    Procedure: Abdominal Aortogram w/Lower Extremity;  Surgeon: Algernon Huxley, MD;  Location: [place] [facility];  Service: Cardiovascular;  Laterality: N/A;  . Peripheral vascular catheterization  02/27/2015    Procedure: Lower Extremity Intervention;  Surgeon: Algernon Huxley, MD;  Location: [place] [facility];  Service: Cardiovascular;;  . Peripheral vascular catheterization Left 02/28/2015    Procedure: Lower Extremity Angiography;  Surgeon: Algernon Huxley, MD;  Location: [place] [facility];  Service: Cardiovascular;  Laterality: Left;  . Peripheral vascular catheterization  02/28/2015    Procedure: Lower Extremity Intervention;  Surgeon: Algernon Huxley, MD;  Location: [place] [facility];  Service: Cardiovascular;;  . Peripheral vascular catheterization Left 05/22/2015    Procedure: Lower Extremity Angiography;  Surgeon: Algernon Huxley, MD;  Location: [place] [facility];  Service: Cardiovascular;  Laterality: Left;  . Peripheral vascular catheterization  05/22/2015    Procedure: Lower Extremity Intervention;  Surgeon: Algernon Huxley, MD;  Location: [place] [facility];  Service: Cardiovascular;;  . Peripheral vascular catheterization Left 05/23/2015    Procedure: Lower Extremity Angiography;  Surgeon: Algernon Huxley, MD;  Location: [place] [facility];  Service: Cardiovascular;  Laterality: Left;  . Peripheral vascular catheterization  05/23/2015    Procedure: Lower Extremity Intervention;  Surgeon: Algernon Huxley, MD;  Location: [place] [facility];  Service: Cardiovascular;;    SOCIAL HISTORY:   Social History  Substance Use Topics  . Smoking status: Former Research scientist (life sciences)  . Smokeless tobacco: Not on file  . Alcohol Use: No    FAMILY HISTORY:   Family History  Problem Relation Age of Onset  . Heart disease Mother   . Heart disease Father   . Cancer Sister     lung  . Breast cancer Daughter     DRUG ALLERGIES:  Allergies  Allergen Reactions  . Codeine Diarrhea  . Hydrocodone Nausea And Vomiting  . Levofloxacin     Other reaction(s): Other (See Comments) Pt denies, questionable  . Tramadol Nausea And Vomiting    REVIEW OF SYSTEMS:   Review of Systems  Constitutional: Positive for fever, chills and malaise/fatigue.  HENT: Negative for ear discharge, ear pain, hearing loss, nosebleeds and sore throat.   Eyes: Negative for blurred vision and pain.  Respiratory: Positive for cough and shortness of breath. Negative for hemoptysis and  wheezing.   Cardiovascular: Negative for chest pain, palpitations and leg swelling.  Gastrointestinal: Negative for nausea, vomiting, abdominal pain, diarrhea and blood in stool.  Genitourinary: Negative for dysuria.  Musculoskeletal: Negative for back pain.  Neurological: Positive for weakness. Negative for dizziness, tremors, speech change, focal weakness, seizures and headaches.  Endo/Heme/Allergies: Does not bruise/bleed easily.  Psychiatric/Behavioral: Negative for depression, suicidal ideas and hallucinations.    MEDICATIONS AT HOME:   Prior to Admission medications   Medication Sig Start Date End Date Taking? Authorizing Provider  albuterol (PROVENTIL HFA;VENTOLIN HFA) 108 (90 BASE) MCG/ACT inhaler Inhale 2 puffs into the lungs 2 (two) times daily as needed for wheezing or shortness of breath.    Yes Historical Provider, MD  albuterol (PROVENTIL) (2.5 MG/3ML) 0.083% nebulizer solution Take 3 mLs (2.5 mg total) by nebulization every 6 (six) hours as needed for wheezing or shortness of breath. 06/11/15  Yes Loletha Grayer, MD  aspirin 325 MG tablet Take 325 mg by mouth daily.   Yes Historical Provider, MD  atorvastatin (LIPITOR) 10 MG tablet Take 10 mg by mouth at bedtime.   Yes Historical Provider, MD  budesonide (PULMICORT) 180 MCG/ACT inhaler Inhale 2 puffs into the lungs 2 (two) times daily.   Yes Historical Provider, MD  Calcium Carbonate-Vit D-Min (CALCIUM 1200) 1200-1000 MG-UNIT CHEW Chew 1 tablet by mouth daily.   Yes Historical Provider, MD  diltiazem (CARDIZEM CD) 180 MG 24 hr capsule Take 180 mg by mouth daily.   Yes Historical Provider, MD  esomeprazole (NEXIUM) 20 MG capsule Take 20 mg by mouth 2 (two) times daily.    Yes Historical Provider, MD  ferrous sulfate 325 (65 FE) MG tablet Take 1 tablet (325 mg total) by mouth daily. 05/24/15  Yes Kimberly A Stegmayer, PA-C  furosemide (LASIX) 20 MG tablet Take 20 mg by mouth 2 (two) times daily.   Yes Historical Provider, MD   gabapentin (NEURONTIN) 300 MG capsule Take 300 mg by mouth 2 (two) times daily.   Yes Historical Provider, MD  glucosamine-chondroitin 500-400 MG tablet Take 1 tablet by mouth daily.   Yes Historical Provider, MD  Melatonin 5 MG TABS Take 5 mg by mouth at bedtime.    Yes Historical Provider, MD  montelukast (SINGULAIR) 10 MG tablet Take 10 mg by mouth daily.   Yes Historical Provider, MD  polyethylene glycol (MIRALAX / GLYCOLAX) packet Take 17 g by mouth daily as needed. Patient taking differently: Take 17 g by mouth daily as needed for mild constipation or moderate constipation.  06/11/15  Yes Richard Leslye Peer, MD  potassium chloride SA (K-DUR,KLOR-CON) 20 MEQ tablet Take 20-40 mEq by mouth 3 (three) times daily. 40 mEq every morning, 20 mEq every afternoon, and 40 mEq at bedtime   Yes Historical Provider, MD  pramipexole (MIRAPEX) 0.25 MG tablet Take 0.25 mg by mouth 2 (two) times daily.   Yes Historical Provider, MD  rivaroxaban (XARELTO) 20 MG TABS tablet Take 20 mg by  mouth daily.    Yes Historical Provider, MD  temazepam (RESTORIL) 15 MG capsule Take 1 capsule (15 mg total) by mouth at bedtime as needed. for sleep 06/11/15  Yes Loletha Grayer, MD  cefUROXime (CEFTIN) 500 MG tablet Take 1 tablet (500 mg total) by mouth 2 (two) times daily with a meal. Patient not taking: Reported on 06/27/2015 06/11/15   Loletha Grayer, MD  predniSONE (DELTASONE) 20 MG tablet Take 2 tablets (40 mg total) by mouth daily with breakfast. For 5 days Patient not taking: Reported on 06/27/2015 06/11/15   Loletha Grayer, MD      VITAL SIGNS:  Blood pressure 111/63, pulse 125, temperature 98.6 F (37 C), resp. rate 13, SpO2 90 %.  PHYSICAL EXAMINATION:   Physical Exam  Constitutional: She is oriented to person, place, and time and well-developed, well-nourished, and in no distress. No distress.  HENT:  Head: Normocephalic.  Eyes: No scleral icterus.  Neck: Normal range of motion. Neck supple. No JVD present.  No tracheal deviation present.  Cardiovascular: Normal rate, regular rhythm and normal heart sounds.  Exam reveals no gallop and no friction rub.   No murmur heard. Pulmonary/Chest: Effort normal and breath sounds normal. No respiratory distress. She has no wheezes. She has no rales. She exhibits no tenderness.  Abdominal: Soft. Bowel sounds are normal. She exhibits no distension and no mass. There is no tenderness. There is no rebound and no guarding.  Musculoskeletal: Normal range of motion. She exhibits no edema.  Neurological: She is alert and oriented to person, place, and time.  Skin: Skin is warm. No rash noted. No erythema.  Psychiatric: Affect and judgment normal.      LABORATORY PANEL:   CBC  Recent Labs Lab 06/27/15 1239  WBC 10.9  HGB 10.3*  HCT 32.2*  PLT 209   ------------------------------------------------------------------------------------------------------------------  Chemistries   Recent Labs Lab 06/27/15 1239  NA 137  K 4.3  CL 98*  CO2 30  GLUCOSE 100*  BUN 16  CREATININE 0.92  CALCIUM 9.0   ------------------------------------------------------------------------------------------------------------------  Cardiac Enzymes  Recent Labs Lab 06/27/15 1239  TROPONINI <0.03   ------------------------------------------------------------------------------------------------------------------  RADIOLOGY:  Dg Chest 2 View  06/27/2015  CLINICAL DATA:  Shortness of breath for 1 day EXAM: CHEST  2 VIEW COMPARISON:  Jun 07, 2015 FINDINGS: There is airspace consolidation in the posterior segment of the right upper lobe, increased from prior study. There is also airspace consolidation in a portion of the superior segment of the right lower, increased from prior study. There has been interval significant clearing from the left lower lobe with patchy atelectasis currently in this area. There is cardiomegaly with pulmonary vascularity within normal limits.  There is a moderate hiatal type hernia. Bones are osteoporotic. There is an old fracture of the left clavicle with remodeling. There is degenerative change in the thoracic spine. IMPRESSION: Infiltrate throughout portions of the right upper and lower lobe, increased from prior study. Partial clearing from the left lower lobe compared to prior study. Stable cardiomegaly. Hiatal type hernia present. Followup PA and lateral chest radiographs recommended in 3-4 weeks following trial of antibiotic therapy to ensure resolution and exclude underlying malignancy. Electronically Signed   By: Lowella Grip III M.D.   On: 06/27/2015 13:49    EKG:  Sinus rhythm no ST elevation or depression  IMPRESSION AND PLAN:   82 year old female with recent hospitalization approximate month ago for pneumonia who presents withHCAP.  1. Acute hypoxic respiratory failure: Patient will be  treated for HCAP. I will also have speech evaluation for possible aspiration due to the location of the pneumonia. Wean oxygen as tolerated.Marland Kitchen  2.HCAP: Continue vancomycin and Zosyn. Follow up on blood and sputum culture   3. History of asthma: Patient does not appear to be in exacerbation at this time. Continue inhalers.  4. Essential hypertension: Continue diltiazem.  5. Anemia of chronic disease: Continue iron supplementation.  6. Hyperlipidemia: Continue atorvastatin.  All the records are reviewed and case discussed with ED provider. Management plans discussed with the patient and she is in agreement  CODE STATUS: limited  TOTAL TIME TAKING CARE OF THIS PATIENT: 50 minutes.    Tyler Robidoux M.D on 06/27/2015 at 3:27 PM  Between 7am to 6pm - Pager - [phone]  After 6pm go to [email or web address] - password [email or web address]  Office  [phone]  CC: Primary care physician; Rusty Aus, MD

## 2015-06-27 NOTE — Progress Notes (Signed)
ANTIBIOTIC CONSULT NOTE - INITIAL  Pharmacy Consult for Vancomycin, Zosyn  Indication: HCAP  Allergies  Allergen Reactions  . Codeine Diarrhea  . Hydrocodone Nausea And Vomiting  . Levofloxacin     Other reaction(s): Other (See Comments) Pt denies, questionable  . Tramadol Nausea And Vomiting    Patient Measurements: Height: 5\' 3"  (160 cm) Weight: 146 lb 4 oz (66.339 kg) IBW/kg (Calculated) : 52.4 Adjusted Body Weight: 57.96 kg   Vital Signs: Temp: 98.4 F (36.9 C) (06/16 1648) Temp Source: Oral (06/16 1648) BP: 110/58 mmHg (06/16 1650) Pulse Rate: 86 (06/16 1648) Intake/Output from previous day:   Intake/Output from this shift: Total I/O In: -  Out: 250 [Urine:250]  Labs:  Recent Labs  06/27/15 1239  WBC 10.9  HGB 10.3*  PLT 209  CREATININE 0.92   Estimated Creatinine Clearance: 41.7 mL/min (by C-G formula based on Cr of 0.92). No results for input(s): VANCOTROUGH, VANCOPEAK, VANCORANDOM, GENTTROUGH, GENTPEAK, GENTRANDOM, TOBRATROUGH, TOBRAPEAK, TOBRARND, AMIKACINPEAK, AMIKACINTROU, AMIKACIN in the last 72 hours.   Microbiology: Recent Results (from the past 720 hour(s))  Culture, blood (routine x 2)     Status: None   Collection Time: 06/07/15  3:51 PM  Result Value Ref Range Status   Specimen Description BLOOD LEFT ANTECUBITAL  Final   Special Requests BOTTLES DRAWN AEROBIC AND ANAEROBIC  6CC  Final   Culture NO GROWTH 12 DAYS  Final   Report Status 06/19/2015 FINAL  Final  Urine culture     Status: Abnormal   Collection Time: 06/07/15  4:51 PM  Result Value Ref Range Status   Specimen Description URINE, RANDOM  Final   Special Requests NONE  Final   Culture MULTIPLE SPECIES PRESENT, SUGGEST RECOLLECTION (A)  Final   Report Status 06/09/2015 FINAL  Final  Culture, blood (routine x 2)     Status: None   Collection Time: 06/07/15  6:43 PM  Result Value Ref Range Status   Specimen Description BLOOD LEFT ASSIST CONTROL  Final   Special Requests  BOTTLES DRAWN AEROBIC AND ANAEROBIC 5 CC  Final   Culture NO GROWTH 12 DAYS  Final   Report Status 06/19/2015 FINAL  Final    Medical History: Past Medical History  Diagnosis Date  . GERD (gastroesophageal reflux disease)   . Asthma   . Anemia   . Arthritis   . H/O blood clots 2014  . Gallstones 2015  . Hypertension   . History of home oxygen therapy     at night  . Bilateral pneumonia may 2017    Medications:  Prescriptions prior to admission  Medication Sig Dispense Refill Last Dose  . albuterol (PROVENTIL HFA;VENTOLIN HFA) 108 (90 BASE) MCG/ACT inhaler Inhale 2 puffs into the lungs 2 (two) times daily as needed for wheezing or shortness of breath.    PRN  . albuterol (PROVENTIL) (2.5 MG/3ML) 0.083% nebulizer solution Take 3 mLs (2.5 mg total) by nebulization every 6 (six) hours as needed for wheezing or shortness of breath. 75 mL 0 PRN  . aspirin 325 MG tablet Take 325 mg by mouth daily.   unknown  . atorvastatin (LIPITOR) 10 MG tablet Take 10 mg by mouth at bedtime.   unknown  . budesonide (PULMICORT) 180 MCG/ACT inhaler Inhale 2 puffs into the lungs 2 (two) times daily.   unknown  . Calcium Carbonate-Vit D-Min (CALCIUM 1200) 1200-1000 MG-UNIT CHEW Chew 1 tablet by mouth daily.   unknown  . diltiazem (CARDIZEM CD) 180 MG 24 hr  capsule Take 180 mg by mouth daily.   unknown  . esomeprazole (NEXIUM) 20 MG capsule Take 20 mg by mouth 2 (two) times daily.    unknown  . ferrous sulfate 325 (65 FE) MG tablet Take 1 tablet (325 mg total) by mouth daily. 30 tablet 3 unknown  . furosemide (LASIX) 20 MG tablet Take 20 mg by mouth 2 (two) times daily.   unknown  . gabapentin (NEURONTIN) 300 MG capsule Take 300 mg by mouth 2 (two) times daily.   unknown  . glucosamine-chondroitin 500-400 MG tablet Take 1 tablet by mouth daily.   unknown  . Melatonin 5 MG TABS Take 5 mg by mouth at bedtime.    unknown  . montelukast (SINGULAIR) 10 MG tablet Take 10 mg by mouth daily.   unknown  .  polyethylene glycol (MIRALAX / GLYCOLAX) packet Take 17 g by mouth daily as needed. (Patient taking differently: Take 17 g by mouth daily as needed for mild constipation or moderate constipation. ) 14 each 0 PRN  . potassium chloride SA (K-DUR,KLOR-CON) 20 MEQ tablet Take 20-40 mEq by mouth 3 (three) times daily. 40 mEq every morning, 20 mEq every afternoon, and 40 mEq at bedtime   unknown  . pramipexole (MIRAPEX) 0.25 MG tablet Take 0.25 mg by mouth 2 (two) times daily.   unknown  . rivaroxaban (XARELTO) 20 MG TABS tablet Take 20 mg by mouth daily.    unknown  . temazepam (RESTORIL) 15 MG capsule Take 1 capsule (15 mg total) by mouth at bedtime as needed. for sleep 30 capsule 0 PRN  . cefUROXime (CEFTIN) 500 MG tablet Take 1 tablet (500 mg total) by mouth 2 (two) times daily with a meal. (Patient not taking: Reported on 06/27/2015) 10 tablet 0   . predniSONE (DELTASONE) 20 MG tablet Take 2 tablets (40 mg total) by mouth daily with breakfast. For 5 days (Patient not taking: Reported on 06/27/2015) 10 tablet 0    Assessment: CrCl = 41.7 ml/min Ke = 0.039 hr-1 T1/2 = 17.8 hrs Vd = 46.4 L  No Pseudomonas risk factors noted.   Goal of Therapy:  Vancomycin trough level 15-20 mcg/ml  Plan:  Expected duration 7 days with resolution of temperature and/or normalization of WBC   Zosyn 3.375 gm IV Q8H EI ordered to start 6/16 @ 22:00.  Vancomycin 1 gm IV X 1 given in ED on 6/16 @ 15:00. Vancomycin 750 mg IV Q18H ordered to start 6/16 @ 21:00, ~ 6 hrs after 1st dose (stacked dosing). This pt will reach Css by 6/20 @ 9:00. Will draw 1st trough on 6/19 @ 20:30, which will be close to Css.   Sharyl Panchal D 06/27/2015,6:18 PM

## 2015-06-27 NOTE — Progress Notes (Signed)
PHARMACIST - PHYSICIAN ORDER COMMUNICATION  CONCERNING: P&T Medication Policy on Herbal Medications  DESCRIPTION:  This patient's order for:  Glucosamine-Chondroitin  has been noted.  This product(s) is classified as an "herbal" or natural product. Due to a lack of definitive safety studies or FDA approval, nonstandard manufacturing practices, plus the potential risk of unknown drug-drug interactions while on inpatient medications, the Pharmacy and Therapeutics Committee does not permit the use of "herbal" or natural products of this type within [place].   ACTION TAKEN: The pharmacy department is unable to verify this order at this time and your patient has been informed of this safety policy. Please reevaluate patient's clinical condition at discharge and address if the herbal or natural product(s) should be resumed at that time.   

## 2015-06-27 NOTE — ED Provider Notes (Signed)
[facility] Emergency Department Provider Note    ____________________________________________  Time seen: ~1315  I have reviewed the triage vital signs and the nursing notes.   HISTORY  Chief Complaint Respiratory Distress   History limited by: Not Limited   HPI Natasha [name] is a 82 y.o. female who presents to the emergency department today because of concern for shortness of breath. Patient states that she was feeling a little off yesterday when she went to bed. This morning at around 2 am she woke up and had chills. Additionally she had increasing shortness of breath. She measured her temperature this morning and it was elevated to 100.8. She took a tylenol which relieved the fever. She was recently admitted to the hospital for pneumonia and is concerned she has pneumonia again.   Past Medical History  Diagnosis Date  . GERD (gastroesophageal reflux disease)   . Asthma   . Anemia   . Arthritis   . H/O blood clots 2014  . Gallstones 2015  . Hypertension   . History of home oxygen therapy     at night  . Bilateral pneumonia may 2017    Patient Active Problem List   Diagnosis Date Noted  . Pneumonia 06/07/2015  . Hypotension 03/11/2015  . Lower extremity atheroembolism ([place]) 02/27/2015  . Ischemic leg 02/27/2015  . Calculus of gallbladder with other cholecystitis, without mention of obstruction 04/20/2013    Past Surgical History  Procedure Laterality Date  . Abdominal hysterectomy  1960  . Appendectomy  1946  . Cochlear implant  2009  . Eye surgery  [phone]    cataract  . Stent placement  2006-2014    multiple stent placements in legs  . Colonoscopy  2015    Dr. Rayann Heman   . Cholecystectomy  04-24-13  . Breast biopsy Bilateral     negative  . Peripheral vascular catheterization N/A 02/27/2015    Procedure: Abdominal Aortogram w/Lower Extremity;  Surgeon: Algernon Huxley, MD;  Location: [place] [facility];  Service: Cardiovascular;   Laterality: N/A;  . Peripheral vascular catheterization  02/27/2015    Procedure: Lower Extremity Intervention;  Surgeon: Algernon Huxley, MD;  Location: [place] [facility];  Service: Cardiovascular;;  . Peripheral vascular catheterization Left 02/28/2015    Procedure: Lower Extremity Angiography;  Surgeon: Algernon Huxley, MD;  Location: [place] [facility];  Service: Cardiovascular;  Laterality: Left;  . Peripheral vascular catheterization  02/28/2015    Procedure: Lower Extremity Intervention;  Surgeon: Algernon Huxley, MD;  Location: [place] [facility];  Service: Cardiovascular;;  . Peripheral vascular catheterization Left 05/22/2015    Procedure: Lower Extremity Angiography;  Surgeon: Algernon Huxley, MD;  Location: [place] [facility];  Service: Cardiovascular;  Laterality: Left;  . Peripheral vascular catheterization  05/22/2015    Procedure: Lower Extremity Intervention;  Surgeon: Algernon Huxley, MD;  Location: [place] [facility];  Service: Cardiovascular;;  . Peripheral vascular catheterization Left 05/23/2015    Procedure: Lower Extremity Angiography;  Surgeon: Algernon Huxley, MD;  Location: [place] [facility];  Service: Cardiovascular;  Laterality: Left;  . Peripheral vascular catheterization  05/23/2015    Procedure: Lower Extremity Intervention;  Surgeon: Algernon Huxley, MD;  Location: [place] [facility];  Service: Cardiovascular;;    Current Outpatient Rx  Name  Route  Sig  Dispense  Refill  . albuterol (PROVENTIL HFA;VENTOLIN HFA) 108 (90 BASE) MCG/ACT inhaler   Inhalation   Inhale 2  puffs into the lungs 2 (two) times daily as needed for wheezing or shortness of breath.          Marland Kitchen albuterol (PROVENTIL) (2.5 MG/3ML) 0.083% nebulizer solution   Nebulization   Take 3 mLs (2.5 mg total) by nebulization every 6 (six) hours as needed for wheezing or shortness of breath.   75 mL   0   . budesonide (PULMICORT) 180 MCG/ACT inhaler   Inhalation   Inhale 2 puffs into the lungs 2 (two) times  daily.         . Calcium-Magnesium-Vitamin D (CALCIUM 1200+D3 PO)   Oral   Take 1 tablet by mouth daily.         . cefUROXime (CEFTIN) 500 MG tablet   Oral   Take 1 tablet (500 mg total) by mouth 2 (two) times daily with a meal.   10 tablet   0   . diltiazem (CARDIZEM CD) 180 MG 24 hr capsule      TAKE 1 CAPSULE (180 MG TOTAL) BY MOUTH ONCE DAILY.      3   . esomeprazole (NEXIUM) 20 MG capsule   Oral   Take 20 mg by mouth 2 (two) times daily.          . ferrous sulfate 325 (65 FE) MG tablet   Oral   Take 1 tablet (325 mg total) by mouth daily.   30 tablet   3   . furosemide (LASIX) 20 MG tablet   Oral   Take 20 mg by mouth 2 (two) times daily.         Marland Kitchen gabapentin (NEURONTIN) 300 MG capsule   Oral   Take 300 mg by mouth 2 (two) times daily.         Marland Kitchen KLOR-CON M20 20 MEQ tablet   Oral   Take 20-40 mEq by mouth See admin instructions. Take 2 tablets (50meq) by mouth every morning, Take 1 tablet by mouth in midday, and take 2 tablets (42meq) by mouth every evening.           Dispense as written.   . Melatonin 5 MG TABS   Oral   Take 10 mg by mouth at bedtime. Reported on 05/22/2015         . montelukast (SINGULAIR) 10 MG tablet   Oral   Take 10 mg by mouth daily.         . OXYGEN   Inhalation   Inhale 2 L into the lungs at bedtime.         . polyethylene glycol (MIRALAX / GLYCOLAX) packet   Oral   Take 17 g by mouth daily as needed.   14 each   0   . pramipexole (MIRAPEX) 0.5 MG tablet   Oral   Take 0.5 mg by mouth 2 (two) times daily.         . predniSONE (DELTASONE) 20 MG tablet   Oral   Take 2 tablets (40 mg total) by mouth daily with breakfast. For 5 days   10 tablet   0   . rivaroxaban (XARELTO) 20 MG TABS tablet   Oral   Take 20 mg by mouth daily with supper.         . simvastatin (ZOCOR) 20 MG tablet   Oral   Take 20 mg by mouth every evening.          . temazepam (RESTORIL) 15 MG capsule   Oral   Take 1  capsule (  15 mg total) by mouth at bedtime as needed. for sleep   30 capsule   0     Allergies Codeine; Hydrocodone; Levofloxacin; and Tramadol  Family History  Problem Relation Age of Onset  . Heart disease Mother   . Heart disease Father   . Cancer Sister     lung  . Breast cancer Daughter     Social History Social History  Substance Use Topics  . Smoking status: Former Research scientist (life sciences)  . Smokeless tobacco: None  . Alcohol Use: No    Review of Systems  Constitutional: Positive for fever. Cardiovascular: Negative for chest pain. Respiratory: Positive for shortness of breath. Gastrointestinal: Negative for abdominal pain, vomiting and diarrhea. Neurological: Negative for headaches, focal weakness or numbness.  10-point ROS otherwise negative.  ____________________________________________   PHYSICAL EXAM:  VITAL SIGNS: ED Triage Vitals  Enc Vitals Group     BP 06/27/15 1231 106/47 mmHg     Pulse Rate 06/27/15 1232 92     Resp --      Temp 06/27/15 1232 98.6 F (37 C)     Temp src --      SpO2 06/27/15 1232 94 %     Weight --      Height --      Head Cir --      Peak Flow --      Pain Score 06/27/15 1230 0   Constitutional: Alert and oriented. Well appearing and in no distress. Eyes: Conjunctivae are normal. PERRL. Normal extraocular movements. ENT   Head: Normocephalic and atraumatic.   Nose: No congestion/rhinnorhea.   Mouth/Throat: Mucous membranes are moist.   Neck: No stridor. Hematological/Lymphatic/Immunilogical: No cervical lymphadenopathy. Cardiovascular: Normal rate, regular rhythm.  No murmurs, rubs, or gallops. Respiratory: Normal respiratory effort without tachypnea nor retractions. Breath sounds are clear and equal bilaterally. No wheezes/rales/rhonchi. Gastrointestinal: Soft and nontender. No distention.  Genitourinary: Deferred Musculoskeletal: Normal range of motion in all extremities. No joint effusions.  No lower extremity  tenderness nor edema. Neurologic:  Normal speech and language. No gross focal neurologic deficits are appreciated.  Skin:  Skin is warm, dry and intact. No rash noted. Psychiatric: Mood and affect are normal. Speech and behavior are normal. Patient exhibits appropriate insight and judgment.  ____________________________________________    LABS (pertinent positives/negatives)  Labs Reviewed  CBC WITH DIFFERENTIAL/PLATELET - Abnormal; Notable for the following:    Hemoglobin 10.3 (*)    HCT 32.2 (*)    RDW 28.0 (*)    Neutro Abs 9.4 (*)    Lymphs Abs 0.9 (*)    All other components within normal limits  BASIC METABOLIC PANEL - Abnormal; Notable for the following:    Chloride 98 (*)    Glucose, Bld 100 (*)    GFR calc non Af Amer 56 (*)    All other components within normal limits  CULTURE, BLOOD (ROUTINE X 2)  CULTURE, BLOOD (ROUTINE X 2)  TROPONIN I  LACTIC ACID, PLASMA  LACTIC ACID, PLASMA     ____________________________________________   EKG  I, Nance Pear, attending physician, personally viewed and interpreted this EKG  EKG Time: 1238 Rate: 90 Rhythm: normal sinus rhythm Axis: normal Intervals: qtc 429 QRS: narrow, LAFB ST changes: no st elevation Impression: abnormal ekg   ____________________________________________    RADIOLOGY  CXR IMPRESSION: Infiltrate throughout portions of the right upper and lower lobe, increased from prior study. Partial clearing from the left lower lobe compared to prior study. Stable cardiomegaly. Hiatal type  hernia present.  Followup PA and lateral chest radiographs recommended in 3-4 weeks following trial of antibiotic therapy to ensure resolution and exclude underlying malignancy.  ____________________________________________   PROCEDURES  Procedure(s) performed: None  Critical Care performed: No  ____________________________________________   INITIAL IMPRESSION / ASSESSMENT AND PLAN / ED  COURSE  Pertinent labs & imaging results that were available during my care of the patient were reviewed by me and considered in my medical decision making (see chart for details).  She presented to the emergency department today because of concerns for shortness of breath and chills that started this morning. Patient was recently admitted for pneumonia is concerned that pneumonia again. Chest x-ray is concerning for pneumonia. The leukocytosis was normal however there is a slight left shift. Will plan on starting antibiotics and admitted to the service.  ____________________________________________   FINAL CLINICAL IMPRESSION(S) / ED DIAGNOSES  Final diagnoses:  Healthcare-associated pneumonia     Note: This dictation was prepared with Dragon dictation. Any transcriptional errors that result from this process are unintentional    Nance Pear, MD 06/27/15 1419

## 2015-06-27 NOTE — Clinical Social Work Note (Signed)
Clinical Social Work Assessment  Patient Details  Name: Natasha [name] MRN: [id number] Date of Birth: Feb 15, 1928  Date of referral:  06/27/15               Reason for consult:  Facility Placement                Permission sought to share information with:  Family Supports, Customer service manager Permission granted to share information::  Yes, Verbal Permission Granted  Name::     Natasha [name] [phone] year old husband) [place]::  Yes  Relationship::  yes  Contact Information:  yes  Housing/Transportation Living arrangements for the past 2 months:  [place] of Information:  Patient Patient Interpreter Needed:  None Criminal Activity/Legal Involvement Pertinent to Current Situation/Hospitalization:  No - Comment as needed Significant Relationships:  Adult Children Lives with:  Spouse Do you feel safe going back to the place where you live?  Yes Need for family participation in patient care:  Yes (Comment)  Care giving concerns: Patient reports she receives home health x3 per week   Social Worker assessment / plan: LCSW met with patient and verbal consent was given to speak to her Husband and facility. She is very hard of hearing. She is married to [place] for many years and he is 80 years old. She has again not been feeling well and returned to hospital within last 2 weeks. She has all her meals in the room. She reports she needs assistance with bathing, incontinent at night and uses 2 litres of 02 at night time. She uses a walker to get around but feels very weak again and that is why she came back. Her insurance  Medicare/AARP  Employment status:  Retired Forensic scientist:  Research scientist (physical sciences)) PT Recommendations:  Not assessed at this time Information / Referral to community resources:  None required at this time  Patient/Family's Response to care:  Husband wants her to be well. She has 3 grown daughter out of  state  Patient/Family's Understanding of and Emotional Response to Diagnosis, Current Treatment, and Prognosis:  Patient reports she just wants to get better  Emotional Assessment Appearance:  Appears stated age Attitude/Demeanor/Rapport:   (Polite and calm VERY HARD OF HEARING) Affect (typically observed):  Accepting, Adaptable, Hopeful Orientation:    To [place] and thing Alcohol / Substance use:  Never Used Psych involvement (Current and /or in the community):  No (Comment)  Discharge Needs  Concerns to be addressed:  Discharge Planning Concerns Readmission within the last 30 days:   yes Current discharge risk:  None Barriers to Discharge:  No Barriers Identified   Joana Reamer, LCSW 06/27/2015, 4:56 PM

## 2015-06-27 NOTE — NC FL2 (Signed)
[place] LEVEL OF CARE SCREENING TOOL     IDENTIFICATION  Patient Name: Natasha [name] Birthdate: 09/24/1928 Sex: female Admission Date (Current Location): 06/27/2015  [place] and [place] Number:  Engineering geologist and Address:  [facility], [address] [place], [place] [address]      Provider Number: [id number]  Attending Physician Name and Address:  Bettey Costa, MD  Relative Name and Phone Number:  Rozanne Mcmeekin [phone]    Current Level of Care:  SNF Recommended Level of Care:  SNF Prior Approval Number:    Date Approved/Denied:   PASRR Number: [id number]  Discharge Plan: SNF    Current Diagnoses: Patient Active Problem List   Diagnosis Date Noted  . HCAP (healthcare-associated pneumonia) 06/27/2015  . Pneumonia 06/07/2015  . Hypotension 03/11/2015  . Lower extremity atheroembolism ([place]) 02/27/2015  . Ischemic leg 02/27/2015  . Calculus of gallbladder with other cholecystitis, without mention of obstruction 04/20/2013    Orientation RESPIRATION BLADDER Height & Weight     Self, Time, Situation, Place  O2 (2 litres at night) Incontinent Weight: 146 lb 4 oz (66.339 kg) Height:     BEHAVIORAL SYMPTOMS/MOOD NEUROLOGICAL BOWEL NUTRITION STATUS      Incontinent Diet (Heart Healthy)  AMBULATORY STATUS COMMUNICATION OF NEEDS Skin   Extensive Assist Verbally Normal                       Personal Care Assistance Level of Assistance  Bathing, Feeding, Dressing Bathing Assistance: Limited assistance Feeding assistance: Independent Dressing Assistance: Limited assistance Total Care Assistance: Limited assistance   Functional Limitations Info  Sight, Hearing, Speech Sight Info: Adequate Hearing Info: Impaired (Cochlear Implant) Speech Info: Adequate    SPECIAL CARE FACTORS FREQUENCY        PT Frequency: x5 OT Frequency: x5            Contractures      Additional Factors Info  Code  Status, Allergies Code Status Info: FULL Allergies Info: Codeine, Hydrocodone, Levofloxacin, Tramadol)           Current Medications (06/27/2015):  This is the current hospital active medication list Current Facility-Administered Medications  Medication Dose Route Frequency Provider Last Rate Last Dose  . 0.9 %  sodium chloride infusion   Intravenous Continuous Bettey Costa, MD      . acetaminophen (TYLENOL) tablet 650 mg  650 mg Oral Q6H PRN Bettey Costa, MD       Or  . acetaminophen (TYLENOL) suppository 650 mg  650 mg Rectal Q6H PRN Sital Mody, MD      . albuterol (PROVENTIL) (2.5 MG/3ML) 0.083% nebulizer solution 2.5 mg  2.5 mg Nebulization Q6H PRN Bettey Costa, MD      . aspirin tablet 325 mg  325 mg Oral Daily Sital Mody, MD      . atorvastatin (LIPITOR) tablet 10 mg  10 mg Oral QHS Sital Mody, MD      . budesonide (PULMICORT) nebulizer solution 0.25 mg  2 mL Inhalation BID Bettey Costa, MD      . calcium-vitamin D (OSCAL WITH D) 500-200 MG-UNIT per tablet 1 tablet  1 tablet Oral Daily Sital Mody, MD      . ceFEPIme (MAXIPIME) 2 g in dextrose 5 % 50 mL IVPB  2 g Intravenous Q8H Vena Rua, RPH 100 mL/hr at 06/27/15 1547 2 g at 06/27/15 1547  . diltiazem (CARDIZEM CD) 24 hr capsule 180 mg  180 mg Oral Daily Bettey Costa, MD      . ferrous sulfate tablet 325 mg  325 mg Oral Daily Sital Mody, MD      . furosemide (LASIX) tablet 20 mg  20 mg Oral BID Bettey Costa, MD      . gabapentin (NEURONTIN) capsule 300 mg  300 mg Oral BID Bettey Costa, MD      . Melatonin TABS 5 mg  5 mg Oral QHS Sital Mody, MD      . montelukast (SINGULAIR) tablet 10 mg  10 mg Oral Daily Sital Mody, MD      . ondansetron (ZOFRAN) tablet 4 mg  4 mg Oral Q6H PRN Bettey Costa, MD       Or  . ondansetron (ZOFRAN) injection 4 mg  4 mg Intravenous Q6H PRN Sital Mody, MD      . pantoprazole (PROTONIX) EC tablet 40 mg  40 mg Oral Daily Sital Mody, MD      . potassium chloride SA (K-DUR,KLOR-CON) CR tablet 20-40 mEq  20-40 mEq  Oral TID Bettey Costa, MD      . pramipexole (MIRAPEX) tablet 0.25 mg  0.25 mg Oral BID Bettey Costa, MD      . rivaroxaban (XARELTO) tablet 20 mg  20 mg Oral Daily Sital Mody, MD      . senna-docusate (Senokot-S) tablet 1 tablet  1 tablet Oral QHS PRN Sital Mody, MD      . sodium chloride flush (NS) 0.9 % injection 3 mL  3 mL Intravenous Q12H Bettey Costa, MD         Discharge Medications: Please see discharge summary for a list of discharge medications.  Relevant Imaging Results:  Relevant Lab Results:   Additional Information SSN [id number], [place], [place]

## 2015-06-27 NOTE — Consult Note (Signed)
Pharmacy Antibiotic Note  Natasha [name] is a 82 y.o. female admitted on 06/27/2015 with pneumonia.  Pharmacy has been consulted for Cefepime dosing.  Plan: Cefepime 2g IV q8hrs, as calculated CrCl ~59 ml/min using weight from 06/11/15 of 69kg.  Temp (24hrs), Avg:98.6 F (37 C), Min:98.6 F (37 C), Max:98.6 F (37 C)   Recent Labs Lab 06/27/15 1239  WBC 10.9  CREATININE 0.92  LATICACIDVEN 1.7    CrCl cannot be calculated (Unknown ideal weight.).    Allergies  Allergen Reactions  . Codeine Diarrhea  . Hydrocodone Nausea And Vomiting  . Levofloxacin     Other reaction(s): Other (See Comments) Pt denies, questionable  . Tramadol Nausea And Vomiting    Antimicrobials this admission: Cefepime 6/16 >>  Vancomycin x1 in ED  Microbiology results: 5/11 MRSA PCR: Negative  Thank you for allowing pharmacy to be a part of this patient's care.  Vena Rua 06/27/2015 2:43 PM

## 2015-06-27 NOTE — Progress Notes (Signed)
Lactic acid came back 3.4, Dr. Benjie Karvonen made aware. No new orders at this time.  Clarise Cruz, RN

## 2015-06-28 LAB — BASIC METABOLIC PANEL
ANION GAP: 5 (ref 5–15)
BUN: 16 mg/dL (ref 6–20)
CALCIUM: 8.3 mg/dL — AB (ref 8.9–10.3)
CO2: 29 mmol/L (ref 22–32)
CREATININE: 0.84 mg/dL (ref 0.44–1.00)
Chloride: 105 mmol/L (ref 101–111)
GLUCOSE: 101 mg/dL — AB (ref 65–99)
Potassium: 4.3 mmol/L (ref 3.5–5.1)
Sodium: 139 mmol/L (ref 135–145)

## 2015-06-28 LAB — SURGICAL PCR SCREEN
MRSA, PCR: NEGATIVE
STAPHYLOCOCCUS AUREUS: NEGATIVE

## 2015-06-28 LAB — CBC
HCT: 26.9 % — ABNORMAL LOW (ref 35.0–47.0)
HEMOGLOBIN: 8.6 g/dL — AB (ref 12.0–16.0)
MCH: 26.1 pg (ref 26.0–34.0)
MCHC: 32 g/dL (ref 32.0–36.0)
MCV: 81.6 fL (ref 80.0–100.0)
PLATELETS: 172 10*3/uL (ref 150–440)
RBC: 3.3 MIL/uL — ABNORMAL LOW (ref 3.80–5.20)
RDW: 28.4 % — ABNORMAL HIGH (ref 11.5–14.5)
WBC: 7 10*3/uL (ref 3.6–11.0)

## 2015-06-28 MED ORDER — PANTOPRAZOLE SODIUM 40 MG PO TBEC
40.0000 mg | DELAYED_RELEASE_TABLET | Freq: Two times a day (BID) | ORAL | Status: DC
  Administered 2015-06-28 – 2015-06-30 (×4): 40 mg via ORAL
  Filled 2015-06-28 (×4): qty 1

## 2015-06-28 NOTE — Care Management Note (Signed)
Case Management Note  Patient Details  Name: Natasha [name] MRN: [id number] Date of Birth: Oct 01, 1928  Subjective/Objective:         82yo Mrs Natasha [name] was admitted from [facility] on 06/27/15 per PNA.            Action/Plan:   Expected Discharge Date:                  Expected Discharge Plan:     In-House Referral:     Discharge planning Services     Post Acute Care Choice:    Choice offered to:     DME Arranged:    DME Agency:     HH Arranged:    [place] Agency:     Status of Service:     Medicare Important Message Given:    Date Medicare IM Given:    Medicare IM give by:    Date Additional Medicare IM Given:    Additional Medicare Important Message give by:     If discussed at [place] of Stay Meetings, dates discussed:    Additional Comments:  Kourtney Montesinos A, RN 06/28/2015, 10:05 AM

## 2015-06-28 NOTE — Progress Notes (Signed)
MRSA PCR negative.  

## 2015-06-28 NOTE — Evaluation (Signed)
Clinical/Bedside Swallow Evaluation Patient Details  Name: LUCCIA LANGHORNE MRN: [id number] Date of Birth: 11-Apr-1928  Today's Date: 06/28/2015 Time: SLP Start Time (ACUTE ONLY): 1110 SLP Stop Time (ACUTE ONLY): 1210 SLP Time Calculation (min) (ACUTE ONLY): 60 min  Past Medical History:  Past Medical History  Diagnosis Date  . GERD (gastroesophageal reflux disease)   . Asthma   . Anemia   . Arthritis   . H/O blood clots 2014  . Gallstones 2015  . Hypertension   . History of home oxygen therapy     at night  . Bilateral pneumonia may 2017   Past Surgical History:  Past Surgical History  Procedure Laterality Date  . Abdominal hysterectomy  1960  . Appendectomy  1946  . Cochlear implant  2009  . Eye surgery  [phone]    cataract  . Stent placement  2006-2014    multiple stent placements in legs  . Colonoscopy  2015    Dr. Rayann Heman   . Cholecystectomy  04-24-13  . Breast biopsy Bilateral     negative  . Peripheral vascular catheterization N/A 02/27/2015    Procedure: Abdominal Aortogram w/Lower Extremity;  Surgeon: Algernon Huxley, MD;  Location: [facility];  Service: Cardiovascular;  Laterality: N/A;  . Peripheral vascular catheterization  02/27/2015    Procedure: Lower Extremity Intervention;  Surgeon: Algernon Huxley, MD;  Location: [facility];  Service: Cardiovascular;;  . Peripheral vascular catheterization Left 02/28/2015    Procedure: Lower Extremity Angiography;  Surgeon: Algernon Huxley, MD;  Location: [facility];  Service: Cardiovascular;  Laterality: Left;  . Peripheral vascular catheterization  02/28/2015    Procedure: Lower Extremity Intervention;  Surgeon: Algernon Huxley, MD;  Location: [facility];  Service: Cardiovascular;;  . Peripheral vascular catheterization Left 05/22/2015    Procedure: Lower Extremity Angiography;  Surgeon: Algernon Huxley, MD;  Location: [facility];  Service: Cardiovascular;  Laterality: Left;  . Peripheral  vascular catheterization  05/22/2015    Procedure: Lower Extremity Intervention;  Surgeon: Algernon Huxley, MD;  Location: [facility];  Service: Cardiovascular;;  . Peripheral vascular catheterization Left 05/23/2015    Procedure: Lower Extremity Angiography;  Surgeon: Algernon Huxley, MD;  Location: [facility];  Service: Cardiovascular;  Laterality: Left;  . Peripheral vascular catheterization  05/23/2015    Procedure: Lower Extremity Intervention;  Surgeon: Algernon Huxley, MD;  Location: [facility];  Service: Cardiovascular;;   HPI:  Pt is a 82 y.o. female with a known history of Hypertension, GERD, Asthma, pneumonia, Large Hiatal Hernia who wears oxygen at night when necessary presents today with shortness of breath. Patient reports since yesterday she has had increasing shortness of breath and cough. She also had chills this morning. Her husband reports that her temperature was 100.6 this morning. Antibiotics begun in the ED. Pt is verbally conversive, alert and oriented. She is HOH aided. Pt is able to describe effects of her Reflux/GERD and Hiatal Hernia as having episodes of regurgitation w/ reflux material "coming through my nose sometimes" - 3 episodes in the past few months per pt. Pt also stated her NExium was changed to the generic version d/t insurance coverage ~2 years ago.    Assessment / Plan / Recommendation Clinical Impression  Pt appears to adequate tolerate trials of thin liquids and soft solids w/ no apparent oropharyngeal phase dysphagia; no overt s/s of aspiration noted and adequate oral  phase management/clearing of boluses. However, pt does have a significant h/o large Hiatal Hernia and GERD w/ regurgitation episodes per pt report baseline. This type of Esophageal dysmotility can greatly increase risk for pneumonia from aspiration of Reflux. Recommend strict Reflux precautions w/ oral diet and f/u w/ GI for further education on Esophageal dysmotility; continue w/ a  po diet of foods easy for mastication and Esophageal clearing; Meds w/ Puree for easier clearing of the Esophagus if needed. Handouts on general reflux precautions given. Pt agreed. Information from BSE given to MD/NSG.  No further skilled ST services indicated at this time.     Aspiration Risk   (increased risk from REFLUX material d/t Esophageal defs.)    Diet Recommendation  regular w/ chopped, moistened meats/foods(less bread); thin liquids; strict REFLUX precautions; general aspiration precautions.   Medication Administration: Whole meds with liquid but can take whole in Puree for easier Esophageal motility/clearing if helpful    Other  Recommendations Recommended Consults: Consider GI evaluation (f/u for education and management) Oral Care Recommendations: Oral care BID;Patient independent with oral care   Follow up Recommendations  None    Frequency and Duration            Prognosis Prognosis for Safe Diet Advancement: Good (from an oropharyngeal standpoint) Barriers to Reach Goals:  (Esophageal dysmotility baseline)      Swallow Study   General Date of Onset: 06/27/15 HPI: Pt is a 82 y.o. female with a known history of Hypertension, GERD, Asthma, pneumonia, Large Hiatal Hernia who wears oxygen at night when necessary presents today with shortness of breath. Patient reports since yesterday she has had increasing shortness of breath and cough. She also had chills this morning. Her husband reports that her temperature was 100.6 this morning. Antibiotics begun in the ED. Pt is verbally conversive, alert and oriented. She is HOH aided. Pt is able to describe effects of her Reflux/GERD and Hiatal Hernia as having episodes of regurgitation w/ reflux material "coming through my nose sometimes" - 3 episodes in the past few months per pt. Pt also stated her NExium was changed to the generic version d/t insurance coverage ~2 years ago.  Type of Study: Bedside Swallow Evaluation Previous  Swallow Assessment: none Diet Prior to this Study: Regular;Thin liquids (less meats) Temperature Spikes Noted: No (wbc not elevated - reduced from admission to 7.0) Respiratory Status: Nasal cannula (4 liters) History of Recent Intubation: No Behavior/Cognition: Alert;Cooperative;Pleasant mood Oral Cavity Assessment: Within Functional Limits Oral Care Completed by SLP: Recent completion by staff Oral Cavity - Dentition: Adequate natural dentition Vision: Functional for self-feeding Self-Feeding Abilities: Able to feed self Patient Positioning: Upright in chair Baseline Vocal Quality: Normal Volitional Cough: Strong;Congested Volitional Swallow: Able to elicit    Oral/Motor/Sensory Function Overall Oral Motor/Sensory Function: Within functional limits   Ice Chips Ice chips: Not tested   Thin Liquid Thin Liquid: Within functional limits Presentation: Cup;Self Fed;Straw (~3 ozs)    Nectar Thick Nectar Thick Liquid: Not tested   Honey Thick Honey Thick Liquid: Not tested   Puree Puree: Within functional limits Presentation: Self Fed;Spoon (2-3 trials)   Solid   GO   Solid: Within functional limits (softened) Presentation: Self Fed (4-5 trials)       Orinda Kenner, MS, CCC-SLP  Darby Fleeman 06/28/2015,5:09 PM

## 2015-06-28 NOTE — Evaluation (Signed)
Physical Therapy Evaluation Patient Details Name: Natasha [name] MRN: [id number] DOB: [birth year] Today's Date: 06/28/2015   History of Present Illness  82 y/o female here with pneumonia.  She was here last month with similar and apparently went to rehab for a few weeks.  Clinical Impression  Pt shows good effort with PT but becomes very fatigued with more than minimal ambulation.  Her O2 dropped to the mid 80s on 4 liters with ambulation and though she was relatively safe (no LOBs, slow but consistent speed) she was exhausted with the effort.  She very much wishes to go home (not go to rehab again) but does realize she may need more time there to build up her tolerance.      Follow Up Recommendations SNF (pt wanting to go home, possible depending on progress)    Equipment Recommendations  None recommended by PT    Recommendations for Other Services       Precautions / Restrictions Precautions Precautions: Fall Restrictions Weight Bearing Restrictions: No      Mobility  Bed Mobility               General bed mobility comments: Pt in recliner on arrival, not tested  Transfers Overall transfer level: Modified independent Equipment used: Rolling walker (2 wheeled)             General transfer comment: Pt needs cues for hand placement and walker use, but ultimately is able to rise w/o direct assist  Ambulation/Gait Ambulation/Gait assistance: Min guard Ambulation Distance (Feet): 75 Feet Assistive device: Rolling walker (2 wheeled)       General Gait Details: Pt intially does well and is confident, but she quickly fatigues and becomes short of breath despite being on 4 liters O2 her sats drop to 86% and he is exhausted with the effort.  Pt with relative safety, but poor activity tolerance.   Stairs            Wheelchair Mobility    Modified Rankin (Stroke Patients Only)       Balance                                              Pertinent Vitals/Pain Pain Assessment: No/denies pain    Home Living Family/patient expects to be discharged to:: Skilled nursing facility (wants to go back to [facility]) Living Arrangements: Spouse/significant other             Home Equipment: Walker - 2 wheels      Prior Function Level of Independence: Independent with assistive device(s)         Comments: Pt reports that she is able to be relatively active and does chores, errands, etc     Hand Dominance        Extremity/Trunk Assessment   Upper Extremity Assessment: Generalized weakness;Overall WFL for tasks assessed (age appropriate limiations (shoulder elevation, etc))           Lower Extremity Assessment: Generalized weakness;Overall WFL for tasks assessed (age appropropriate deficits)         Communication   Communication: HOH  Cognition Arousal/Alertness: Awake/alert Behavior During Therapy: WFL for tasks assessed/performed Overall Cognitive Status: Within Functional Limits for tasks assessed                      General Comments  Exercises        Assessment/Plan    PT Assessment Patient needs continued PT services  PT Diagnosis Generalized weakness;Difficulty walking   PT Problem List Decreased strength;Decreased range of motion;Decreased activity tolerance;Decreased balance;Decreased mobility;Decreased coordination;Decreased safety awareness;Decreased knowledge of use of DME  PT Treatment Interventions DME instruction;Gait training;Functional mobility training;Therapeutic activities;Therapeutic exercise;Balance training   PT Goals (Current goals can be found in the Care Plan section) Acute Rehab PT Goals Patient Stated Goal: go back to [facility] PT Goal Formulation: With patient Time For Goal Achievement: 07/12/15 Potential to Achieve Goals: Fair    Frequency Min 2X/week   Barriers to discharge        Co-evaluation               End  of Session Equipment Utilized During Treatment: Gait belt;Oxygen (4 liters) Activity Tolerance: Patient limited by fatigue Patient left: with chair alarm set;with call bell/phone within reach           Time: 0931-0954 PT Time Calculation (min) (ACUTE ONLY): 23 min   Charges:   PT Evaluation $PT Eval Low Complexity: 1 Procedure     PT G Codes:        Kreg Shropshire, DPT 06/28/2015, 11:37 AM

## 2015-06-28 NOTE — Progress Notes (Signed)
[facility] [place] NAME: Natasha [name]    MRN#:  [id number]  DATE OF BIRTH:  [birth year]  SUBJECTIVE:  Hospital Day: 1 day Natasha [name] is a 82 y.o. female presenting with Respiratory Distress .   Overnight events: No overnight events Interval Events: Still complains of shortness of breath mainly on activity  REVIEW OF SYSTEMS:  CONSTITUTIONAL: No fever, fatigue or weakness.  EYES: No blurred or double vision.  EARS, NOSE, AND THROAT: No tinnitus or ear pain.  RESPIRATORY: Positive cough, shortness of breath, denies wheezing or hemoptysis.  CARDIOVASCULAR: No chest pain, orthopnea, edema.  GASTROINTESTINAL: No nausea, vomiting, diarrhea or abdominal pain. Positive dyspepsia GENITOURINARY: No dysuria, hematuria.  ENDOCRINE: No polyuria, nocturia,  HEMATOLOGY: No anemia, easy bruising or bleeding SKIN: No rash or lesion. MUSCULOSKELETAL: No joint pain or arthritis.   NEUROLOGIC: No tingling, numbness, weakness.  PSYCHIATRY: No anxiety or depression.   DRUG ALLERGIES:   Allergies  Allergen Reactions  . Codeine Diarrhea  . Hydrocodone Nausea And Vomiting  . Levofloxacin     Other reaction(s): Other (See Comments) Pt denies, questionable  . Tramadol Nausea And Vomiting    VITALS:  Blood pressure 124/43, pulse 87, temperature 98.7 F (37.1 C), temperature source Oral, resp. rate 18, height 5\' 3"  (1.6 m), weight 146 lb 4 oz (66.339 kg), SpO2 65 %.  PHYSICAL EXAMINATION:  VITAL SIGNS: Filed Vitals:   06/28/15 0624 06/28/15 0846  BP: 123/52 124/43  Pulse: 74 87  Temp: 98.7 F (37.1 C)   Resp: 18    GENERAL:82 y.o.female currently in no acute distress.  HEAD: Normocephalic, atraumatic.  EYES: Pupils equal, round, reactive to light. Extraocular muscles intact. No scleral icterus.  MOUTH: Moist mucosal membrane. Dentition intact. No abscess noted.  EAR, NOSE, THROAT: Clear without exudates. No external  lesions.  NECK: Supple. No thyromegaly. No nodules. No JVD.  PULMONARY: right-sided rhonchi without wheeze rails No use of accessory muscles, Good respiratory effort. good air entry bilaterally CHEST: Nontender to palpation.  CARDIOVASCULAR: S1 and S2. Regular rate and rhythm. No murmurs, rubs, or gallops. No edema. Pedal pulses 2+ bilaterally.  GASTROINTESTINAL: Soft, nontender, nondistended. No masses. Positive bowel sounds. No hepatosplenomegaly.  MUSCULOSKELETAL: No swelling, clubbing, or edema. Range of motion full in all extremities.  NEUROLOGIC: Cranial nerves II through XII are intact. No gross focal neurological deficits. Sensation intact. Reflexes intact.  SKIN: No ulceration, lesions, rashes, or cyanosis. Skin warm and dry. Turgor intact.  PSYCHIATRIC: Mood, affect within normal limits. The patient is awake, alert and oriented x 3. Insight, judgment intact.      LABORATORY PANEL:   CBC  Recent Labs Lab 06/28/15 0514  WBC 7.0  HGB 8.6*  HCT 26.9*  PLT 172   ------------------------------------------------------------------------------------------------------------------  Chemistries   Recent Labs Lab 06/28/15 0514  NA 139  K 4.3  CL 105  CO2 29  GLUCOSE 101*  BUN 16  CREATININE 0.84  CALCIUM 8.3*   ------------------------------------------------------------------------------------------------------------------  Cardiac Enzymes  Recent Labs Lab 06/27/15 1239  TROPONINI <0.03   ------------------------------------------------------------------------------------------------------------------  RADIOLOGY:  Dg Chest 2 View  06/27/2015  CLINICAL DATA:  Shortness of breath for 1 day EXAM: CHEST  2 VIEW COMPARISON:  Jun 07, 2015 FINDINGS: There is airspace consolidation in the posterior segment of the right upper lobe, increased from prior study. There is also airspace consolidation in a portion of the superior segment of the right lower, increased from prior  study. There  has been interval significant clearing from the left lower lobe with patchy atelectasis currently in this area. There is cardiomegaly with pulmonary vascularity within normal limits. There is a moderate hiatal type hernia. Bones are osteoporotic. There is an old fracture of the left clavicle with remodeling. There is degenerative change in the thoracic spine. IMPRESSION: Infiltrate throughout portions of the right upper and lower lobe, increased from prior study. Partial clearing from the left lower lobe compared to prior study. Stable cardiomegaly. Hiatal type hernia present. Followup PA and lateral chest radiographs recommended in 3-4 weeks following trial of antibiotic therapy to ensure resolution and exclude underlying malignancy. Electronically Signed   By: Lowella Grip III M.D.   On: 06/27/2015 13:49    EKG:   Orders placed or performed during the hospital encounter of 06/27/15  . EKG 12-Lead  . EKG 12-Lead    ASSESSMENT AND PLAN:   Natasha [name] is a 82 y.o. female presenting with Respiratory Distress . Admitted 06/27/2015 : Day #: 1 day  1. Acute on chronic hypoxic respiratory failure secondary to healthcare associated pneumonia: Currently on vancomycin/Zosyn MRSA PCR pending culture data pending wean oxygen as tolerated baseline uses 2 L nasal cannula intermittently  2. Aspiration: Evaluation by speech patient most likely has component of GERD which is playing an issue given her hiatal hernia has been given techniques to decrease risk 3. GERD without esophagitis: Increase PPI coverage   4. Essential hypertension: Continue diltiazem.  5. Anemia of chronic disease: Continue iron supplementation.  6. Hyperlipidemia unspecified: Continue atorvastatin.  Disposition: Physical therapy evaluation  All the records are reviewed and case discussed with Care Management/Social Workerr. Management plans discussed with the patient, family and they are in  agreement.  CODE STATUS: limited TOTAL TIME TAKING CARE OF THIS PATIENT: 33 minutes.   POSSIBLE D/C IN 2-3DAYS, DEPENDING ON CLINICAL CONDITION.   Natasha [name],  Natasha [name].D on 06/28/2015 at 11:36 AM  Between 7am to 6pm - Pager - [phone]  After 6pm: House Pager: - [phone]  [facility]  Office  [phone]  CC: Primary care physician; Rusty Aus, MD

## 2015-06-29 NOTE — Progress Notes (Signed)
[facility] at [place] NAME: Natasha [name]    MRN#:  [id number]  DATE OF BIRTH:  1928-04-19  SUBJECTIVE:  Hospital Day: 2 days Natasha [name] is a 82 y.o. female presenting with Respiratory Distress .   Overnight events: No overnight events Interval Events: Breathing somewhat improved, hard of hearing  REVIEW OF SYSTEMS:  CONSTITUTIONAL: No fever, fatigue or weakness.  EYES: No blurred or double vision.  EARS, NOSE, AND THROAT: No tinnitus or ear pain.  RESPIRATORY: Positive cough, shortness of breath, denies wheezing or hemoptysis.  CARDIOVASCULAR: No chest pain, orthopnea, edema.  GASTROINTESTINAL: No nausea, vomiting, diarrhea or abdominal pain. Positive dyspepsia GENITOURINARY: No dysuria, hematuria.  ENDOCRINE: No polyuria, nocturia,  HEMATOLOGY: No anemia, easy bruising or bleeding SKIN: No rash or lesion. MUSCULOSKELETAL: No joint pain or arthritis.   NEUROLOGIC: No tingling, numbness, weakness.  PSYCHIATRY: No anxiety or depression.   DRUG ALLERGIES:   Allergies  Allergen Reactions  . Codeine Diarrhea  . Hydrocodone Nausea And Vomiting  . Levofloxacin     Other reaction(s): Other (See Comments) Pt denies, questionable  . Tramadol Nausea And Vomiting    VITALS:  Blood pressure 139/48, pulse 89, temperature 97.9 F (36.6 C), temperature source Oral, resp. rate 17, height 5\' 3"  (1.6 m), weight 146 lb 4 oz (66.339 kg), SpO2 94 %.  PHYSICAL EXAMINATION:  VITAL SIGNS: Filed Vitals:   06/29/15 0527 06/29/15 1014  BP: 138/50 139/48  Pulse:  89  Temp:    Resp:     GENERAL:82 y.o.female currently in no acute distress.  HEAD: Normocephalic, atraumatic.  EYES: Pupils equal, round, reactive to light. Extraocular muscles intact. No scleral icterus.  MOUTH: Moist mucosal membrane. Dentition intact. No abscess noted.  EAR, NOSE, THROAT: Clear without exudates. No external lesions.  NECK: Supple. No thyromegaly.  No nodules. No JVD.  PULMONARY: Decreased right-sided rhonchi without wheeze rails No use of accessory muscles, Good respiratory effort. good air entry bilaterally CHEST: Nontender to palpation.  CARDIOVASCULAR: S1 and S2. Regular rate and rhythm. No murmurs, rubs, or gallops. No edema. Pedal pulses 2+ bilaterally.  GASTROINTESTINAL: Soft, nontender, nondistended. No masses. Positive bowel sounds. No hepatosplenomegaly.  MUSCULOSKELETAL: No swelling, clubbing, or edema. Range of motion full in all extremities.  NEUROLOGIC: Cranial nerves II through XII are intact. No gross focal neurological deficits. Sensation intact. Reflexes intact.  SKIN: No ulceration, lesions, rashes, or cyanosis. Skin warm and dry. Turgor intact.  PSYCHIATRIC: Mood, affect within normal limits. The patient is awake, alert and oriented x 3. Insight, judgment intact.      LABORATORY PANEL:   CBC  Recent Labs Lab 06/28/15 0514  WBC 7.0  HGB 8.6*  HCT 26.9*  PLT 172   ------------------------------------------------------------------------------------------------------------------  Chemistries   Recent Labs Lab 06/28/15 0514  NA 139  K 4.3  CL 105  CO2 29  GLUCOSE 101*  BUN 16  CREATININE 0.84  CALCIUM 8.3*   ------------------------------------------------------------------------------------------------------------------  Cardiac Enzymes  Recent Labs Lab 06/27/15 1239  TROPONINI <0.03   ------------------------------------------------------------------------------------------------------------------  RADIOLOGY:  Dg Chest 2 View  06/27/2015  CLINICAL DATA:  Shortness of breath for 1 day EXAM: CHEST  2 VIEW COMPARISON:  Jun 07, 2015 FINDINGS: There is airspace consolidation in the posterior segment of the right upper lobe, increased from prior study. There is also airspace consolidation in a portion of the superior segment of the right lower, increased from prior study. There has been interval  significant clearing  from the left lower lobe with patchy atelectasis currently in this area. There is cardiomegaly with pulmonary vascularity within normal limits. There is a moderate hiatal type hernia. Bones are osteoporotic. There is an old fracture of the left clavicle with remodeling. There is degenerative change in the thoracic spine. IMPRESSION: Infiltrate throughout portions of the right upper and lower lobe, increased from prior study. Partial clearing from the left lower lobe compared to prior study. Stable cardiomegaly. Hiatal type hernia present. Followup PA and lateral chest radiographs recommended in 3-4 weeks following trial of antibiotic therapy to ensure resolution and exclude underlying malignancy. Electronically Signed   By: Lowella Grip III M.D.   On: 06/27/2015 13:49    EKG:   Orders placed or performed during the hospital encounter of 06/27/15  . EKG 12-Lead  . EKG 12-Lead    ASSESSMENT AND PLAN:   Natasha [name] is a 82 y.o. female presenting with Respiratory Distress . Admitted 06/27/2015 : Day #: 2 days  1. Acute on chronic hypoxic respiratory failure secondary to healthcare associated pneumonia: Zosyn  wean oxygen as tolerated baseline uses 2 L nasal cannula intermittently Discontinue vancomycin given MRSA PCR negative  2. Aspiration: Evaluation by speech patient most likely has component of GERD which is playing an issue given her hiatal hernia has been given techniques to decrease risk 3. GERD without esophagitis: Increase PPI coverage   4. Essential hypertension: Continue diltiazem.  5. Anemia of chronic disease: Continue iron supplementation.  6. Hyperlipidemia unspecified: Continue atorvastatin.  Disposition: Likely SNF  All the records are reviewed and case discussed with Care Management/Social Workerr. Management plans discussed with the patient, family and they are in agreement.  CODE STATUS: limited TOTAL TIME TAKING CARE OF THIS PATIENT:  28 minutes.   POSSIBLE D/C IN 2-3DAYS, DEPENDING ON CLINICAL CONDITION.   Huntington Leverich,  Natasha [name] on 06/29/2015 at 11:43 AM  Between 7am to 6pm - Pager - [phone]  After 6pm: House Pager: - [phone]  [facility]  Office  [phone]  CC: Primary care physician; Rusty Aus, MD

## 2015-06-30 MED ORDER — AMOXICILLIN-POT CLAVULANATE ER 1000-62.5 MG PO TB12: 2.0000 | ORAL_TABLET | Freq: Two times a day (BID) | ORAL | Status: DC

## 2015-06-30 MED ORDER — AZITHROMYCIN 250 MG PO TABS: 250.0000 mg | ORAL_TABLET | Freq: Every day | ORAL | Status: DC

## 2015-06-30 NOTE — Care Management (Signed)
Admitted from [facility] with the diagnosis of pneumonia. Chronic oxygen at night 2 liters per nasal cannula. Currently on 4 liters. Ambulated 75 feet with physical therapy 06/28/15. Recommendations are skilled nursing facility. (home pending progress). IV Zosyn conrinues. Low grade temperature = 99.2. Up in chair.  Shelbie Ammons RN MSN CCM Care Management [phone]

## 2015-06-30 NOTE — Progress Notes (Signed)
Patient is discharged to [facility]. Concerns addressed. IV site removed. Patient discharge summary and prescriptions given to patient.

## 2015-06-30 NOTE — Care Management Important Message (Signed)
Important Message  Patient Details  Name: Natasha [name] MRN: [id number] Date of Birth: [birth year]   Medicare Important Message Given:  Yes    Shelbie Ammons, RN 06/30/2015, 8:39 AM

## 2015-06-30 NOTE — Care Management (Signed)
Discharge to home today per Dr. Lavetta Nielsen. Spoke with Ms. Deupree at the bedside. States that she has already called [facility] and arranged for a nurse to come to her home two hours a days twice a week and she will be going to outpatient therapy there at [facility] facility. Husband will transport. Shelbie Ammons RN MSN CCM Care Management [phone]

## 2015-06-30 NOTE — Clinical Social Work Note (Signed)
Pt is ready for discharge today and will return home to [facility]. RNCM is following for discharge planning needs. CSW is signing off as no further needs identified.   Darden Dates, MSW, LCSW  Clinical Social Worker  [phone]

## 2015-06-30 NOTE — Discharge Summary (Signed)
[facility] at [place] NAME: Natasha [name]    MR#:  [id number]  DATE OF BIRTH:  [birth year]  DATE OF ADMISSION:  06/27/2015 ADMITTING PHYSICIAN: Bettey Costa, MD  DATE OF DISCHARGE: 06/30/2015  PRIMARY CARE PHYSICIAN: Rusty Aus, MD    ADMISSION DIAGNOSIS:  Healthcare-associated pneumonia [J18.9]  DISCHARGE DIAGNOSIS:  Acute on chronic respiratory failure secondary to HCAP GERD without esophagitis  SECONDARY DIAGNOSIS:   Past Medical History  Diagnosis Date  . GERD (gastroesophageal reflux disease)   . Asthma   . Anemia   . Arthritis   . H/O blood clots 2014  . Gallstones 2015  . Hypertension   . History of home oxygen therapy     at night  . Bilateral pneumonia may 2017    HOSPITAL COURSE:  Natasha [name]  is a 82 y.o. female admitted 06/27/2015 with chief complaint Respiratory Distress . Please see H&P performed by Bettey Costa, MD for further information. She presented with the above complaints. Found to have multilobar pneumonia. Started on broad spectrum antibiotics which have been tapered down - MRSA PCR negative. She was evaluated by speech given a question of aspiration. Fortunately she does not have frank aspiration but does have a large hiatal hernia which predisposes her to micro aspiration. She was given information and techniques on how to avoid this.  Evaluated by PT who recommended SNF - the patient however wants to be discharged home, will set up for home health  DISCHARGE CONDITIONS:   stable  CONSULTS OBTAINED:     DRUG ALLERGIES:   Allergies  Allergen Reactions  . Codeine Diarrhea  . Hydrocodone Nausea And Vomiting  . Levofloxacin     Other reaction(s): Other (See Comments) Pt denies, questionable  . Tramadol Nausea And Vomiting    DISCHARGE MEDICATIONS:   Current Discharge Medication List    START taking these medications   Details  amoxicillin-clavulanate (AUGMENTIN XR) 1000-62.5 MG  12 hr tablet Take 2 tablets by mouth 2 (two) times daily. Qty: 6 tablet, Refills: 0    azithromycin (ZITHROMAX) 250 MG tablet Take 1 tablet (250 mg total) by mouth daily. Qty: 3 each, Refills: 0      CONTINUE these medications which have NOT CHANGED   Details  albuterol (PROVENTIL HFA;VENTOLIN HFA) 108 (90 BASE) MCG/ACT inhaler Inhale 2 puffs into the lungs 2 (two) times daily as needed for wheezing or shortness of breath.     albuterol (PROVENTIL) (2.5 MG/3ML) 0.083% nebulizer solution Take 3 mLs (2.5 mg total) by nebulization every 6 (six) hours as needed for wheezing or shortness of breath. Qty: 75 mL, Refills: 0    aspirin 325 MG tablet Take 325 mg by mouth daily.    atorvastatin (LIPITOR) 10 MG tablet Take 10 mg by mouth at bedtime.    budesonide (PULMICORT) 180 MCG/ACT inhaler Inhale 2 puffs into the lungs 2 (two) times daily.    Calcium Carbonate-Vit D-Min (CALCIUM 1200) 1200-1000 MG-UNIT CHEW Chew 1 tablet by mouth daily.    diltiazem (CARDIZEM CD) 180 MG 24 hr capsule Take 180 mg by mouth daily.    esomeprazole (NEXIUM) 20 MG capsule Take 20 mg by mouth 2 (two) times daily.     ferrous sulfate 325 (65 FE) MG tablet Take 1 tablet (325 mg total) by mouth daily. Qty: 30 tablet, Refills: 3    furosemide (LASIX) 20 MG tablet Take 20 mg by mouth 2 (two) times daily.    gabapentin (  NEURONTIN) 300 MG capsule Take 300 mg by mouth 2 (two) times daily.    glucosamine-chondroitin 500-400 MG tablet Take 1 tablet by mouth daily.    Melatonin 5 MG TABS Take 5 mg by mouth at bedtime.     montelukast (SINGULAIR) 10 MG tablet Take 10 mg by mouth daily.    polyethylene glycol (MIRALAX / GLYCOLAX) packet Take 17 g by mouth daily as needed. Qty: 14 each, Refills: 0    potassium chloride SA (K-DUR,KLOR-CON) 20 MEQ tablet Take 20-40 mEq by mouth 3 (three) times daily. 40 mEq every morning, 20 mEq every afternoon, and 40 mEq at bedtime    pramipexole (MIRAPEX) 0.25 MG tablet Take 0.25 mg  by mouth 2 (two) times daily.    rivaroxaban (XARELTO) 20 MG TABS tablet Take 20 mg by mouth daily.     temazepam (RESTORIL) 15 MG capsule Take 1 capsule (15 mg total) by mouth at bedtime as needed. for sleep Qty: 30 capsule, Refills: 0      STOP taking these medications     cefUROXime (CEFTIN) 500 MG tablet      predniSONE (DELTASONE) 20 MG tablet          DISCHARGE INSTRUCTIONS:    DIET:  Cardiac diet  DISCHARGE CONDITION:  Stable  ACTIVITY:  Activity as tolerated  OXYGEN:  Home Oxygen: Yes.     Oxygen Delivery: 2 liters/min via Patient connected to nasal cannula oxygen  DISCHARGE LOCATION:  home   If you experience worsening of your admission symptoms, develop shortness of breath, life threatening emergency, suicidal or homicidal thoughts you must seek medical attention immediately by calling 911 or calling your MD immediately  if symptoms less severe.  You Must read complete instructions/literature along with all the possible adverse reactions/side effects for all the Medicines you take and that have been prescribed to you. Take any new Medicines after you have completely understood and accpet all the possible adverse reactions/side effects.   Please note  You were cared for by a hospitalist during your hospital stay. If you have any questions about your discharge medications or the care you received while you were in the hospital after you are discharged, you can call the unit and asked to speak with the hospitalist on call if the hospitalist that took care of you is not available. Once you are discharged, your primary care physician will handle any further medical issues. Please note that NO REFILLS for any discharge medications will be authorized once you are discharged, as it is imperative that you return to your primary care physician (or establish a relationship with a primary care physician if you do not have one) for your aftercare needs so that they can  reassess your need for medications and monitor your lab values.    On the day of Discharge:   VITAL SIGNS:  Blood pressure 130/52, pulse 70, temperature 97.8 F (36.6 C), temperature source Oral, resp. rate 18, height 5\' 3"  (1.6 m), weight 146 lb 4 oz (66.339 kg), SpO2 95 %.  I/O:   Intake/Output Summary (Last 24 hours) at 06/30/15 0954 Last data filed at 06/30/15 0800  Gross per 24 hour  Intake    500 ml  Output    400 ml  Net    100 ml    PHYSICAL EXAMINATION:  GENERAL:  82 y.o.-year-old patient lying in the bed with no acute distress.  EYES: Pupils equal, round, reactive to light and accommodation. No scleral icterus. Extraocular muscles  intact.  HEENT: Head atraumatic, normocephalic. Oropharynx and nasopharynx clear.  NECK:  Supple, no jugular venous distention. No thyroid enlargement, no tenderness.  LUNGS: Normal breath sounds bilaterally, no wheezing, rales,rhonchi or crepitation. No use of accessory muscles of respiration.  CARDIOVASCULAR: S1, S2 normal. No murmurs, rubs, or gallops.  ABDOMEN: Soft, non-tender, non-distended. Bowel sounds present. No organomegaly or mass.  EXTREMITIES: No pedal edema, cyanosis, or clubbing.  NEUROLOGIC: Cranial nerves II through XII are intact. Muscle strength 5/5 in all extremities. Sensation intact. Gait not checked.  PSYCHIATRIC: The patient is alert and oriented x 3.  SKIN: No obvious rash, lesion, or ulcer.   DATA REVIEW:   CBC  Recent Labs Lab 06/28/15 0514  WBC 7.0  HGB 8.6*  HCT 26.9*  PLT 172    Chemistries   Recent Labs Lab 06/28/15 0514  NA 139  K 4.3  CL 105  CO2 29  GLUCOSE 101*  BUN 16  CREATININE 0.84  CALCIUM 8.3*    Cardiac Enzymes  Recent Labs Lab 06/27/15 1239  [place] <0.03    Microbiology Results  Results for orders placed or performed during the hospital encounter of 06/27/15  Blood culture (routine x 2)     Status: None (Preliminary result)   Collection Time: 06/27/15 12:39 PM    Result Value Ref Range Status   Specimen Description BLOOD LEFT ARM  Final   Special Requests BOTTLES DRAWN AEROBIC AND ANAEROBIC  1CC  Final   Culture NO GROWTH 2 DAYS  Final   Report Status PENDING  Incomplete  Blood culture (routine x 2)     Status: None (Preliminary result)   Collection Time: 06/27/15  2:40 PM  Result Value Ref Range Status   Specimen Description BLOOD LEFT ASSIST CONTROL  Final   Special Requests   Final    BOTTLES DRAWN AEROBIC AND ANAEROBIC 5CC AERO [place] ANA   Culture NO GROWTH 2 DAYS  Final   Report Status PENDING  Incomplete  Surgical pcr screen     Status: None   Collection Time: 06/28/15 11:20 AM  Result Value Ref Range Status   MRSA, PCR NEGATIVE NEGATIVE Final   Staphylococcus aureus NEGATIVE NEGATIVE Final    Comment:        The Xpert SA Assay (FDA approved for NASAL specimens in patients over 66 years of age), is one component of a comprehensive surveillance program.  Test performance has been validated by [facility] for patients greater than or equal to 66 year old. It is not intended to diagnose infection nor to guide or monitor treatment.     RADIOLOGY:  No results found.   Management plans discussed with the patient, family and they are in agreement.  CODE STATUS:     Code Status Orders        Start     Ordered   06/27/15 1642  Limited resuscitation (code)   Continuous    Question Answer Comment  In the event of cardiac or respiratory ARREST: Initiate Code Blue, Call Rapid Response Yes   In the event of cardiac or respiratory ARREST: Perform CPR Yes   In the event of cardiac or respiratory ARREST: Perform Intubation/Mechanical Ventilation No   In the event of cardiac or respiratory ARREST: Use NIPPV/BiPAp only if indicated Yes   In the event of cardiac or respiratory ARREST: Administer ACLS medications if indicated Yes   In the event of cardiac or respiratory ARREST: Perform Defibrillation or Cardioversion if indicated Yes  06/27/15 1642    Code Status History    Date Active Date Inactive Code Status Order ID Comments User Context   06/07/2015  8:08 PM 06/11/2015  7:26 PM Full Code [id number]  Fritzi Mandes, MD Inpatient   05/22/2015  1:52 PM 05/24/2015  4:12 PM Full Code [id number]  Algernon Huxley, MD Inpatient   03/11/2015  6:20 PM 03/16/2015  2:15 PM Full Code [id number]  Fritzi Mandes, MD Inpatient   02/27/2015  3:53 PM 03/03/2015  6:24 PM Full Code [id number]  Sela Hua, PA-C Inpatient    Advance Directive Documentation        Most Recent Value   Type of Advance Directive  Healthcare Power of [place], Living will   Pre-existing out of facility DNR order (yellow form or pink MOST form)     "MOST" Form in Place?        TOTAL TIME TAKING CARE OF THIS PATIENT: 28 minutes.    Manveer Gomes,  Karenann Cai.D on 06/30/2015 at 9:54 AM  Between 7am to 6pm - Pager - [phone]  After 6pm go to [email or web address] - Proofreader  [facility]  Office  [phone]  CC: Primary care physician; Rusty Aus, MD

## 2015-07-04 LAB — CULTURE, BLOOD (ROUTINE X 2)
CULTURE: NO GROWTH
Culture: NO GROWTH

## 2015-07-22 ENCOUNTER — Ambulatory Visit (INDEPENDENT_AMBULATORY_CARE_PROVIDER_SITE_OTHER): Payer: Medicare Other | Admitting: Podiatry

## 2015-07-22 ENCOUNTER — Encounter: Payer: Self-pay | Admitting: Podiatry

## 2015-07-22 DIAGNOSIS — M79676 Pain in unspecified toe(s): Secondary | ICD-10-CM

## 2015-07-22 DIAGNOSIS — M2040 Other hammer toe(s) (acquired), unspecified foot: Secondary | ICD-10-CM

## 2015-07-22 DIAGNOSIS — M79606 Pain in leg, unspecified: Secondary | ICD-10-CM

## 2015-07-22 DIAGNOSIS — B351 Tinea unguium: Secondary | ICD-10-CM

## 2015-07-22 DIAGNOSIS — L84 Corns and callosities: Secondary | ICD-10-CM

## 2015-07-22 NOTE — Progress Notes (Signed)
Patient ID: Natasha [name], female   DOB: 23-Nov-1928, 82 y.o.   MRN: [id number]  Subjective: 82 y.o. returns the office today for painful, elongated, thickened toenails which she cannot trim herself. Denies any redness or drainage around the nails. Also has painful calluses to both of her feet. Denies any acute changes since last appointment and no new complaints today. Denies any systemic complaints such as fevers, chills, nausea, vomiting.   Objective: AAO 3, NAD DP/PT pulses palpable, CRT less than 3 seconds Nails hypertrophic, dystrophic, elongated, brittle, discolored 10. There is tenderness overlying the nails 1-5 bilaterally. There is no surrounding erythema or drainage along the nail sites. Hyperkeratotic lesion right foot Center metatarsal 1 in left digit distal. Upon debridement there is no underlying ulceration however on the left foot the area is pre-ulcerative. No edema, erythema. Hammertoes are present. No open lesions or pre-ulcerative lesions are identified. No other areas of tenderness bilateral lower extremities. No overlying edema, erythema, increased warmth. No pain with calf compression, swelling, warmth, erythema.  Assessment: Patient presents with symptomatic onychomycosis; hyperkeratotic lesions  Plan: -Treatment options including alternatives, risks, complications were discussed -Nails sharply debrided 10 without complication/bleeding. -Hyperkeratotic lesions debrided 3 without complications or bleeding. -Offloading pads dispensed they for the hammertoe. This any further skin breakdown the call the office and she verbally understood this. -Discussed daily foot inspection. If there are any changes, to call the office immediately.  -Follow-up in 3 months or sooner if any problems are to arise. In the meantime, encouraged to call the office with any questions, concerns, changes symptoms.  Celesta Gentile, DPM'

## 2015-08-04 ENCOUNTER — Encounter (HOSPITAL_COMMUNITY): Payer: Self-pay

## 2015-08-04 ENCOUNTER — Other Ambulatory Visit: Payer: Self-pay

## 2015-08-04 ENCOUNTER — Emergency Department (HOSPITAL_COMMUNITY): Payer: Medicare Other

## 2015-08-04 ENCOUNTER — Emergency Department (HOSPITAL_COMMUNITY)
Admission: EM | Admit: 2015-08-04 | Discharge: 2015-08-04 | Disposition: A | Discharge status: Home / Self Care | Payer: Medicare Other | Attending: Emergency Medicine | Admitting: Emergency Medicine

## 2015-08-04 DIAGNOSIS — J209 Acute bronchitis, unspecified: Secondary | ICD-10-CM | POA: Diagnosis not present

## 2015-08-04 DIAGNOSIS — Z95828 Presence of other vascular implants and grafts: Secondary | ICD-10-CM | POA: Diagnosis not present

## 2015-08-04 DIAGNOSIS — R0602 Shortness of breath: Secondary | ICD-10-CM | POA: Diagnosis present

## 2015-08-04 DIAGNOSIS — Z7982 Long term (current) use of aspirin: Secondary | ICD-10-CM | POA: Insufficient documentation

## 2015-08-04 DIAGNOSIS — J45909 Unspecified asthma, uncomplicated: Secondary | ICD-10-CM | POA: Insufficient documentation

## 2015-08-04 DIAGNOSIS — I509 Heart failure, unspecified: Secondary | ICD-10-CM | POA: Insufficient documentation

## 2015-08-04 DIAGNOSIS — Z7901 Long term (current) use of anticoagulants: Secondary | ICD-10-CM | POA: Diagnosis not present

## 2015-08-04 DIAGNOSIS — I110 Hypertensive heart disease with heart failure: Secondary | ICD-10-CM | POA: Diagnosis not present

## 2015-08-04 DIAGNOSIS — J40 Bronchitis, not specified as acute or chronic: Secondary | ICD-10-CM

## 2015-08-04 DIAGNOSIS — Z87891 Personal history of nicotine dependence: Secondary | ICD-10-CM | POA: Diagnosis not present

## 2015-08-04 LAB — CBC WITH DIFFERENTIAL/PLATELET
BASOS PCT: 0 %
Basophils Absolute: 0 10*3/uL (ref 0.0–0.1)
EOS PCT: 0 %
Eosinophils Absolute: 0 10*3/uL (ref 0.0–0.7)
HEMATOCRIT: 32.6 % — AB (ref 36.0–46.0)
HEMOGLOBIN: 10.1 g/dL — AB (ref 12.0–15.0)
Lymphocytes Relative: 18 %
Lymphs Abs: 1.6 10*3/uL (ref 0.7–4.0)
MCH: 28.3 pg (ref 26.0–34.0)
MCHC: 31 g/dL (ref 30.0–36.0)
MCV: 91.3 fL (ref 78.0–100.0)
MONO ABS: 0.8 10*3/uL (ref 0.1–1.0)
Monocytes Relative: 9 %
NEUTROS PCT: 73 %
Neutro Abs: 6.4 10*3/uL (ref 1.7–7.7)
PLATELETS: 243 10*3/uL (ref 150–400)
RBC: 3.57 MIL/uL — ABNORMAL LOW (ref 3.87–5.11)
RDW: 21.5 % — ABNORMAL HIGH (ref 11.5–15.5)
WBC: 8.8 10*3/uL (ref 4.0–10.5)

## 2015-08-04 LAB — COMPREHENSIVE METABOLIC PANEL
ALK PHOS: 100 U/L (ref 38–126)
ALT: 14 U/L (ref 14–54)
AST: 21 U/L (ref 15–41)
Albumin: 3.1 g/dL — ABNORMAL LOW (ref 3.5–5.0)
Anion gap: 7 (ref 5–15)
BILIRUBIN TOTAL: 0.5 mg/dL (ref 0.3–1.2)
BUN: 12 mg/dL (ref 6–20)
CALCIUM: 8.9 mg/dL (ref 8.9–10.3)
CO2: 29 mmol/L (ref 22–32)
CREATININE: 0.94 mg/dL (ref 0.44–1.00)
Chloride: 100 mmol/L — ABNORMAL LOW (ref 101–111)
GFR, EST NON AFRICAN AMERICAN: 54 mL/min — AB (ref >60)
Glucose, Bld: 118 mg/dL — ABNORMAL HIGH (ref 65–99)
Potassium: 3.9 mmol/L (ref 3.5–5.1)
Sodium: 136 mmol/L (ref 135–145)
TOTAL PROTEIN: 6 g/dL — AB (ref 6.5–8.1)

## 2015-08-04 LAB — URINALYSIS, ROUTINE W REFLEX MICROSCOPIC
Bilirubin Urine: NEGATIVE
GLUCOSE, UA: NEGATIVE mg/dL
KETONES UR: NEGATIVE mg/dL
Nitrite: NEGATIVE
PH: 6 (ref 5.0–8.0)
PROTEIN: NEGATIVE mg/dL
Specific Gravity, Urine: 1.01 (ref 1.005–1.030)

## 2015-08-04 LAB — I-STAT TROPONIN, ED: TROPONIN I, POC: 0.03 ng/mL (ref 0.00–0.08)

## 2015-08-04 LAB — I-STAT CG4 LACTIC ACID, ED: Lactic Acid, Venous: 0.84 mmol/L (ref 0.5–1.9)

## 2015-08-04 LAB — BRAIN NATRIURETIC PEPTIDE: B Natriuretic Peptide: 343.1 pg/mL — ABNORMAL HIGH (ref 0.0–100.0)

## 2015-08-04 LAB — URINE MICROSCOPIC-ADD ON

## 2015-08-04 MED ORDER — AZITHROMYCIN 250 MG PO TABS
500.0000 mg | ORAL_TABLET | Freq: Once | ORAL | Status: AC
  Administered 2015-08-04: 500 mg via ORAL
  Filled 2015-08-04: qty 2

## 2015-08-04 MED ORDER — ALBUTEROL SULFATE (2.5 MG/3ML) 0.083% IN NEBU: 5.0000 mg | INHALATION_SOLUTION | Freq: Once | RESPIRATORY_TRACT | Status: DC

## 2015-08-04 MED ORDER — AZITHROMYCIN 250 MG PO TABS: 250.0000 mg | ORAL_TABLET | Freq: Every day | ORAL | 0 refills | Status: DC

## 2015-08-04 NOTE — ED Notes (Signed)
Pt returned from X-ray.  

## 2015-08-04 NOTE — ED Provider Notes (Signed)
[facility] DEPT Provider Note   CSN: [id number] Arrival date & time: 08/04/15  1122  First Provider Contact:  None       History   Chief Complaint Chief Complaint  Patient presents with  . Shortness of Breath    HPI Natasha [name] is a 82 y.o. female.  Patient is an 82 year old female with a history of anemia, asthma, home oxygen requirement at night and recent healthcare associated pneumonia in June presenting today with one week of worsening shortness of breath, nonproductive cough and a fever that started today of 101 at home. Patient has increased her home oxygen to improve her shortness of breath. She denies any nausea, vomiting or diarrhea. She slept all day yesterday but denies poor by mouth intake. Her PCP on Friday and at that time had an x-ray done.  She denies any chest pain or new leg swelling   The history is provided by the patient.  Shortness of Breath  This is a recurrent problem. The average episode lasts 1 week. The problem occurs frequently.Episode onset: 1 week. The problem has been gradually worsening. Associated symptoms include a fever and cough. Pertinent negatives include no rhinorrhea, no sputum production, no wheezing, no chest pain, no vomiting, no abdominal pain and no leg swelling. It is unknown what precipitated the problem. She has tried ipratropium inhalers for the symptoms. The treatment provided no relief. She has had prior hospitalizations. She has had prior ED visits. Associated medical issues include COPD and pneumonia.    Past Medical History:  Diagnosis Date  . Anemia   . Arthritis   . Asthma   . Bilateral pneumonia may 2017  . Gallstones 2015  . GERD (gastroesophageal reflux disease)   . H/O blood clots 2014  . History of home oxygen therapy    at night  . Hypertension     Patient Active Problem List   Diagnosis Date Noted  . HCAP (healthcare-associated pneumonia) 06/27/2015  . Pneumonia 06/07/2015  . Hypotension  03/11/2015  . Lower extremity atheroembolism ([place]) 02/27/2015  . Ischemic leg 02/27/2015  . Calculus of gallbladder with other cholecystitis, without mention of obstruction 04/20/2013    Past Surgical History:  Procedure Laterality Date  . ABDOMINAL HYSTERECTOMY  1960  . APPENDECTOMY  1946  . BREAST BIOPSY Bilateral    negative  . CHOLECYSTECTOMY  04-24-13  . COCHLEAR IMPLANT  2009  . COLONOSCOPY  2015   Dr. Rayann Heman   . EYE SURGERY  2008,2009   cataract  . PERIPHERAL VASCULAR CATHETERIZATION N/A 02/27/2015   Procedure: Abdominal Aortogram w/Lower Extremity;  Surgeon: Algernon Huxley, MD;  Location: [facility];  Service: Cardiovascular;  Laterality: N/A;  . PERIPHERAL VASCULAR CATHETERIZATION  02/27/2015   Procedure: Lower Extremity Intervention;  Surgeon: Algernon Huxley, MD;  Location: [facility];  Service: Cardiovascular;;  . PERIPHERAL VASCULAR CATHETERIZATION Left 02/28/2015   Procedure: Lower Extremity Angiography;  Surgeon: Algernon Huxley, MD;  Location: [facility];  Service: Cardiovascular;  Laterality: Left;  . PERIPHERAL VASCULAR CATHETERIZATION  02/28/2015   Procedure: Lower Extremity Intervention;  Surgeon: Algernon Huxley, MD;  Location: [facility];  Service: Cardiovascular;;  . PERIPHERAL VASCULAR CATHETERIZATION Left 05/22/2015   Procedure: Lower Extremity Angiography;  Surgeon: Algernon Huxley, MD;  Location: [facility];  Service: Cardiovascular;  Laterality: Left;  . PERIPHERAL VASCULAR CATHETERIZATION  05/22/2015   Procedure: Lower Extremity Intervention;  Surgeon: Algernon Huxley, MD;  Location: [facility];  Service: Cardiovascular;;  . PERIPHERAL VASCULAR CATHETERIZATION Left 05/23/2015   Procedure: Lower Extremity Angiography;  Surgeon: Algernon Huxley, MD;  Location: [facility];  Service: Cardiovascular;  Laterality: Left;  . PERIPHERAL VASCULAR CATHETERIZATION  05/23/2015   Procedure: Lower Extremity Intervention;  Surgeon: Algernon Huxley, MD;  Location: [facility];  Service: Cardiovascular;;  . stent placement  2006-2014   multiple stent placements in legs    OB History    Gravida Para Term Preterm AB Living   5 3     2 3    SAB TAB Ectopic Multiple Live Births   2              Obstetric Comments   1st Menstrual Cycle:  14 1st Pregnancy:  18       Home Medications    Prior to Admission medications   Medication Sig Start Date End Date Taking? Authorizing Provider  albuterol (PROVENTIL HFA;VENTOLIN HFA) 108 (90 BASE) MCG/ACT inhaler Inhale 2 puffs into the lungs 2 (two) times daily as needed for wheezing or shortness of breath.     Historical Provider, MD  albuterol (PROVENTIL) (2.5 MG/3ML) 0.083% nebulizer solution Take 3 mLs (2.5 mg total) by nebulization every 6 (six) hours as needed for wheezing or shortness of breath. 06/11/15   Loletha Grayer, MD  amoxicillin-clavulanate (AUGMENTIN XR) 1000-62.5 MG 12 hr tablet Take 2 tablets by mouth 2 (two) times daily. 06/30/15   Lytle Butte, MD  aspirin 325 MG tablet Take 325 mg by mouth daily.    Historical Provider, MD  atorvastatin (LIPITOR) 10 MG tablet Take 10 mg by mouth at bedtime.    Historical Provider, MD  azithromycin (ZITHROMAX) 250 MG tablet Take 1 tablet (250 mg total) by mouth daily. 06/30/15   Lytle Butte, MD  budesonide (PULMICORT) 180 MCG/ACT inhaler Inhale 2 puffs into the lungs 2 (two) times daily.    Historical Provider, MD  Calcium Carbonate-Vit D-Min (CALCIUM 1200) 1200-1000 MG-UNIT CHEW Chew 1 tablet by mouth daily.    Historical Provider, MD  diltiazem (CARDIZEM CD) 180 MG 24 hr capsule Take 180 mg by mouth daily.    Historical Provider, MD  esomeprazole (NEXIUM) 20 MG capsule Take 20 mg by mouth 2 (two) times daily.     Historical Provider, MD  ferrous sulfate 325 (65 FE) MG tablet Take 1 tablet (325 mg total) by mouth daily. 05/24/15   Kimberly A Stegmayer, PA-C  furosemide (LASIX) 20 MG tablet Take 20 mg by mouth 2 (two) times daily.     Historical Provider, MD  gabapentin (NEURONTIN) 300 MG capsule Take 300 mg by mouth 2 (two) times daily.    Historical Provider, MD  glucosamine-chondroitin 500-400 MG tablet Take 1 tablet by mouth daily.    Historical Provider, MD  Melatonin 5 MG TABS Take 5 mg by mouth at bedtime.     Historical Provider, MD  montelukast (SINGULAIR) 10 MG tablet Take 10 mg by mouth daily.    Historical Provider, MD  polyethylene glycol (MIRALAX / GLYCOLAX) packet Take 17 g by mouth daily as needed. Patient taking differently: Take 17 g by mouth daily as needed for mild constipation or moderate constipation.  06/11/15   Loletha Grayer, MD  potassium chloride SA (K-DUR,KLOR-CON) 20 MEQ tablet Take 20-40 mEq by mouth 3 (three) times daily. 40 mEq every morning, 20 mEq every afternoon, and 40 mEq at bedtime    Historical  Provider, MD  pramipexole (MIRAPEX) 0.25 MG tablet Take 0.25 mg by mouth 2 (two) times daily.    Historical Provider, MD  rivaroxaban (XARELTO) 20 MG TABS tablet Take 20 mg by mouth daily.     Historical Provider, MD  temazepam (RESTORIL) 15 MG capsule Take 1 capsule (15 mg total) by mouth at bedtime as needed. for sleep 06/11/15   Loletha Grayer, MD    Family History Family History  Problem Relation Age of Onset  . Heart disease Mother   . Heart disease Father   . Cancer Sister     lung  . Breast cancer Daughter     Social History Social History  Substance Use Topics  . Smoking status: Former Research scientist (life sciences)  . Smokeless tobacco: Never Used  . Alcohol use No     Allergies   Codeine; Hydrocodone; Levofloxacin; and Tramadol   Review of Systems Review of Systems  Constitutional: Positive for fever.  HENT: Negative for rhinorrhea.   Respiratory: Positive for cough and shortness of breath. Negative for sputum production and wheezing.   Cardiovascular: Negative for chest pain and leg swelling.  Gastrointestinal: Negative for abdominal pain and vomiting.  All other systems reviewed and  are negative.    Physical Exam Updated Vital Signs BP 132/63 (BP Location: Right Arm)   Pulse 88   Temp 98.3 F (36.8 C) (Oral)   Resp 19   SpO2 97%   Physical Exam  Constitutional: She is oriented to person, place, and time. She appears well-developed and well-nourished. No distress.  HENT:  Head: Normocephalic and atraumatic.  Mouth/Throat: Oropharynx is clear and moist.  Eyes: Conjunctivae and EOM are normal. Pupils are equal, round, and reactive to light.  Neck: Normal range of motion. Neck supple.  Cardiovascular: Normal rate, regular rhythm and intact distal pulses.   No murmur heard. Pulmonary/Chest: Effort normal. No respiratory distress. She has no wheezes. She has rales in the right lower field and the left lower field. She exhibits no tenderness.  Abdominal: Soft. She exhibits no distension. There is no tenderness. There is no rebound and no guarding.  Musculoskeletal: Normal range of motion. She exhibits no edema or tenderness.  Neurological: She is alert and oriented to person, place, and time.  Skin: Skin is warm and dry. No rash noted. No erythema.  Psychiatric: She has a normal mood and affect. Her behavior is normal.  Nursing note and vitals reviewed.    ED Treatments / Results  Labs (all labs ordered are listed, but only abnormal results are displayed) Labs Reviewed  CBC WITH DIFFERENTIAL/PLATELET - Abnormal; Notable for the following:       Result Value   RBC 3.57 (*)    Hemoglobin 10.1 (*)    HCT 32.6 (*)    RDW 21.5 (*)    All other components within normal limits  COMPREHENSIVE METABOLIC PANEL - Abnormal; Notable for the following:    Chloride 100 (*)    Glucose, Bld 118 (*)    Total Protein 6.0 (*)    Albumin 3.1 (*)    GFR calc non Af Amer 54 (*)    All other components within normal limits  URINALYSIS, ROUTINE W REFLEX MICROSCOPIC (NOT AT [facility]) - Abnormal; Notable for the following:    Hgb urine dipstick SMALL (*)    Leukocytes, UA SMALL  (*)    All other components within normal limits  BRAIN NATRIURETIC PEPTIDE - Abnormal; Notable for the following:    B Natriuretic Peptide  343.1 (*)    All other components within normal limits  URINE MICROSCOPIC-ADD ON - Abnormal; Notable for the following:    Squamous Epithelial / LPF 0-5 (*)    Bacteria, UA RARE (*)    All other components within normal limits  URINE CULTURE  CULTURE, BLOOD (ROUTINE X 2)  CULTURE, BLOOD (ROUTINE X 2)  I-STAT TROPOININ, ED  I-STAT CG4 LACTIC ACID, ED    EKG ED ECG REPORT   Date: 08/04/2015  Rate: 88  Rhythm: normal sinus rhythm  QRS Axis: left  Intervals: normal  ST/T Wave abnormalities: normal  Conduction Disutrbances:none  Narrative Interpretation:   Old EKG Reviewed: none available  I have personally reviewed the EKG tracing and agree with the computerized printout as noted.  Radiology Dg Chest 2 View  Result Date: 08/04/2015 CLINICAL DATA:  Worsening shortness of breath with some left-sided crackles. Chronic oxygen use. EXAM: CHEST  2 VIEW COMPARISON:  06/27/2015 FINDINGS: The cardiac silhouette remains enlarged. Aortic atherosclerosis is noted. There is a chronic large hiatal hernia. The lungs are mildly hyperinflated. There is mild pulmonary vascular congestion. No overt alveolar edema. Right lung consolidation on the prior study has resolved. There is no evidence of new airspace consolidation or pneumothorax. There are trace bilateral pleural effusions. Right upper quadrant abdominal surgical clips and thoracolumbar spondylosis are noted. IMPRESSION: 1. Cardiomegaly with mild pulmonary vascular congestion and trace pleural effusions. 2. Clearing of right lung consolidation without current evidence of pneumonia. 3. Aortic atherosclerosis. Electronically Signed   By: Logan Bores M.D.   On: 08/04/2015 12:40   Procedures Procedures (including critical care time)  Medications Ordered in ED Medications - No data to display   Initial  Impression / Assessment and Plan / ED Course  I have reviewed the triage vital signs and the nursing notes.  Pertinent labs & imaging results that were available during my care of the patient were reviewed by me and considered in my medical decision making (see chart for details).  Clinical Course   Patient is an 82 year old female with multiple medical problems including asthma on home oxygen therapy, repeat healthcare associated pneumonia presenting today with worsening nonproductive cough, fever and shortness of breath. Fever just started today was 101 at home. Patient did not take anything for the fever and here is afebrile. Patient saw her doctor on Friday and had an x-ray done but does not know the results. She denies any prior cardiac history and no recent leg swelling.  On exam she has mild bibasilar rales but no other significant findings on exam. She is able to speak in complete sentences and is in no acute distress at this time.  EKG shows left axis deviation but otherwise a normal EKG.  Today patient's troponin, CBC and BMP are all unchanged. Chest x-ray shows signs of clearing pneumonia but mild pulmonary vascular congestion and trace pleural effusions.  BNP is mildly elevated today at 340. Back in May her BNP was only 80. This may be the cause of why she has slightly short of breath.  At this time there is no evidence for pneumonia.  Patient may have bronchitis but the cough may also be related to mild fluid retention. Feel that patient would probably benefit from a few days of Lasix.  We'll discuss with her PCP Dr. Sabra Heck.  4:56 PM Patient was able to ambulate to the bathroom independently with only minimal desaturation to 88% which quickly resolved without intervention. At no time did patient feel  short of breath and was not tachypnea. Discussed with Dr. Sabra Heck and will start patient on azithromycin as she does not tolerate Levaquin and she will increase her Lasix to 2 tablets in the  morning and 1 at night. Currently she takes 1 in the morning and 1 at night. Office will call her for a follow-up appointment.  Final Clinical Impressions(s) / ED Diagnoses   Final diagnoses:  Bronchitis  Acute on chronic congestive heart failure, unspecified congestive heart failure type (HCC)    New Prescriptions New Prescriptions   AZITHROMYCIN (ZITHROMAX) 250 MG TABLET    Take 1 tablet (250 mg total) by mouth daily. Take first 2 tablets together, then 1 every day until finished.     Blanchie Dessert, MD 08/04/15 [phone]

## 2015-08-04 NOTE — Progress Notes (Signed)
Patient is not wheezing and clear to ascultation.

## 2015-08-04 NOTE — ED Triage Notes (Signed)
Per [facility], Pt is coming from [facility].  Pt's nurse reported increase SOB with some left crackles. Pt has Hx of PNA. Chronically on 2L of Oxygen. When EMS arrived, pt was Mid 80s on 2L. They increased oxygen to 4L with Low 90s. BP 135/64, 99.9 Temp, 90-100 A fib. Pt had a temperature of 101.1 this morning at 0300. Was given Tylenol by Nurse. With effective relief. Reports lower right back pain that started on Friday.Hx of cochlear implants.

## 2015-08-04 NOTE — ED Notes (Signed)
MD Plunkett at the bedside

## 2015-08-04 NOTE — ED Notes (Signed)
Phlebotomy at the bedside  

## 2015-08-04 NOTE — ED Notes (Signed)
Pt ambulated in the hallway to the restroom and back. Sats did drop to 80s but pt tolerate well and stated she feel fine and thought she was doing better.

## 2015-08-05 LAB — URINE CULTURE

## 2015-08-09 LAB — CULTURE, BLOOD (ROUTINE X 2)
CULTURE: NO GROWTH
Culture: NO GROWTH

## 2015-08-11 ENCOUNTER — Other Ambulatory Visit: Payer: Self-pay | Admitting: Orthopedic Surgery

## 2015-08-11 DIAGNOSIS — S32040A Wedge compression fracture of fourth lumbar vertebra, initial encounter for closed fracture: Secondary | ICD-10-CM

## 2015-08-14 ENCOUNTER — Encounter
Admission: RE | Admit: 2015-08-14 | Discharge: 2015-08-14 | Disposition: A | Discharge status: Home / Self Care | Payer: Medicare Other | Source: Ambulatory Visit | Attending: Orthopedic Surgery | Admitting: Orthopedic Surgery

## 2015-08-14 DIAGNOSIS — S32040A Wedge compression fracture of fourth lumbar vertebra, initial encounter for closed fracture: Secondary | ICD-10-CM | POA: Diagnosis present

## 2015-08-14 DIAGNOSIS — X58XXXA Exposure to other specified factors, initial encounter: Secondary | ICD-10-CM | POA: Insufficient documentation

## 2015-08-14 MED ORDER — TECHNETIUM TC 99M MEDRONATE IV KIT
25.0000 | PACK | Freq: Once | INTRAVENOUS | Status: AC | PRN
  Administered 2015-08-14: 22.2 via INTRAVENOUS

## 2015-08-15 ENCOUNTER — Encounter
Admission: RE | Admit: 2015-08-15 | Discharge: 2015-08-15 | Disposition: A | Discharge status: Home / Self Care | Payer: Medicare Other | Source: Ambulatory Visit | Attending: Orthopedic Surgery | Admitting: Orthopedic Surgery

## 2015-08-15 DIAGNOSIS — I10 Essential (primary) hypertension: Secondary | ICD-10-CM | POA: Insufficient documentation

## 2015-08-15 DIAGNOSIS — D649 Anemia, unspecified: Secondary | ICD-10-CM | POA: Insufficient documentation

## 2015-08-15 DIAGNOSIS — M4856XA Collapsed vertebra, not elsewhere classified, lumbar region, initial encounter for fracture: Secondary | ICD-10-CM | POA: Insufficient documentation

## 2015-08-15 DIAGNOSIS — M1990 Unspecified osteoarthritis, unspecified site: Secondary | ICD-10-CM | POA: Diagnosis not present

## 2015-08-15 DIAGNOSIS — K219 Gastro-esophageal reflux disease without esophagitis: Secondary | ICD-10-CM | POA: Insufficient documentation

## 2015-08-15 DIAGNOSIS — Z01812 Encounter for preprocedural laboratory examination: Secondary | ICD-10-CM | POA: Insufficient documentation

## 2015-08-15 DIAGNOSIS — Z9981 Dependence on supplemental oxygen: Secondary | ICD-10-CM | POA: Diagnosis not present

## 2015-08-15 DIAGNOSIS — I739 Peripheral vascular disease, unspecified: Secondary | ICD-10-CM | POA: Insufficient documentation

## 2015-08-15 DIAGNOSIS — G2581 Restless legs syndrome: Secondary | ICD-10-CM | POA: Diagnosis not present

## 2015-08-15 DIAGNOSIS — Z86718 Personal history of other venous thrombosis and embolism: Secondary | ICD-10-CM | POA: Diagnosis not present

## 2015-08-15 HISTORY — DX: Cochlear implant status: Z96.21

## 2015-08-15 HISTORY — DX: Peripheral vascular disease, unspecified: I73.9

## 2015-08-15 HISTORY — DX: Restless legs syndrome: G25.81

## 2015-08-15 HISTORY — DX: Bronchitis, not specified as acute or chronic: J40

## 2015-08-15 LAB — DIFFERENTIAL
BASOS ABS: 0.1 10*3/uL (ref 0–0.1)
BASOS PCT: 1 %
Eosinophils Absolute: 0 10*3/uL (ref 0–0.7)
Eosinophils Relative: 0 %
LYMPHS ABS: 1.8 10*3/uL (ref 1.0–3.6)
Lymphocytes Relative: 17 %
MONOS PCT: 7 %
Monocytes Absolute: 0.8 10*3/uL (ref 0.2–0.9)
NEUTROS ABS: 7.5 10*3/uL — AB (ref 1.4–6.5)
NEUTROS PCT: 75 %

## 2015-08-15 LAB — PROTIME-INR
INR: 1.08
Prothrombin Time: 14 seconds (ref 11.4–15.2)

## 2015-08-15 LAB — CBC
HEMATOCRIT: 36.1 % (ref 35.0–47.0)
HEMOGLOBIN: 11.9 g/dL — AB (ref 12.0–16.0)
MCH: 29.1 pg (ref 26.0–34.0)
MCHC: 33 g/dL (ref 32.0–36.0)
MCV: 88.1 fL (ref 80.0–100.0)
Platelets: 463 10*3/uL — ABNORMAL HIGH (ref 150–440)
RBC: 4.09 MIL/uL (ref 3.80–5.20)
RDW: 21.2 % — ABNORMAL HIGH (ref 11.5–14.5)
WBC: 10.2 10*3/uL (ref 3.6–11.0)

## 2015-08-15 LAB — SURGICAL PCR SCREEN
MRSA, PCR: NEGATIVE
Staphylococcus aureus: NEGATIVE

## 2015-08-15 LAB — POTASSIUM: POTASSIUM: 4.5 mmol/L (ref 3.5–5.1)

## 2015-08-15 LAB — APTT: APTT: 37 s — AB (ref 24–36)

## 2015-08-15 NOTE — Patient Instructions (Signed)
  Your procedure is scheduled [id number] 8, 2017 (Tuesday) Report to Same Day Surgery 2nd floor Medical Mall To find out your arrival time please call [phone] between 1PM - 3PM on  August 18, 2015 (Monday)  Remember: Instructions that are not followed completely may result in serious medical risk, up to and including death, or upon the discretion of your surgeon and anesthesiologist your surgery may need to be rescheduled.    _x___ 1. Do not eat food or drink liquids after midnight. No gum chewing or hard candies.     __x__ 2. No Alcohol for 24 hours before or after surgery.   __x__3. No Smoking for 24 prior to surgery.   ____  4. Bring all medications with you on the day of surgery if instructed.    __x__ 5. Notify your doctor if there is any change in your medical condition     (cold, fever, infections).     Do not wear jewelry, make-up, hairpins, clips or nail polish.  Do not wear lotions, powders, or perfumes. You may wear deodorant.  Do not shave 48 hours prior to surgery. Men may shave face and neck.  Do not bring valuables to the hospital.    [facility] is not responsible for any belongings or valuables.               Contacts, dentures or bridgework may not be worn into surgery.  Leave your suitcase in the car. After surgery it may be brought to your room.  For patients admitted to the hospital, discharge time is determined by your treatment team.   Patients discharged the day of surgery will not be allowed to drive home.    Please read over the following fact sheets that you were given:   [facility] Preparing for Surgery and or MRSA Information   _x___ Take these medicines the morning of surgery with A SIP OF WATER:    1. Nexium  2. Diltiazem  3. Singulair  4. Gabentin  5.  6.  ____ Fleet Enema (as directed)   _x___ Use CHG Soap or sage wipes as directed on instruction sheet   _x_ Use inhalers on the day of surgery and bring to hospital day of surgery (Use  Pulmicort and Pro Air inhalers the morning of surgery, and bring to hospital)  ____ Stop metformin 2 days prior to surgery    ____ Take 1/2 of usual insulin dose the night before surgery and none on the morning of  Surgery          __x__ Stop aspirin or coumadin, or plavix (Patient stated she has already stopped Aspirin and Xarelto on August 13, 2015 and started Lovenox injections )  _x__ Stop Anti-inflammatories such as Advil, Aleve, Ibuprofen, Motrin, Naproxen,          Naprosyn, Goodies powders or aspirin products. Ok to take Tylenol.   _x___ Stop supplements until after surgery.  (Stop Glucosamine and Melatonin now until after surgery)  ____ Bring C-Pap to the hospital.

## 2015-08-19 ENCOUNTER — Ambulatory Visit: Payer: Medicare Other | Admitting: Anesthesiology

## 2015-08-19 ENCOUNTER — Encounter: Payer: Self-pay | Admitting: *Deleted

## 2015-08-19 ENCOUNTER — Ambulatory Visit: Payer: Medicare Other

## 2015-08-19 ENCOUNTER — Encounter
Admission: RE | Disposition: A | Discharge status: Home / Self Care | Payer: Self-pay | Source: Ambulatory Visit | Attending: Orthopedic Surgery

## 2015-08-19 ENCOUNTER — Ambulatory Visit
Admission: RE | Admit: 2015-08-19 | Discharge: 2015-08-19 | Disposition: A | Discharge status: Home / Self Care | Payer: Medicare Other | Source: Ambulatory Visit | Attending: Orthopedic Surgery | Admitting: Orthopedic Surgery

## 2015-08-19 DIAGNOSIS — I2510 Atherosclerotic heart disease of native coronary artery without angina pectoris: Secondary | ICD-10-CM | POA: Insufficient documentation

## 2015-08-19 DIAGNOSIS — Z881 Allergy status to other antibiotic agents status: Secondary | ICD-10-CM | POA: Insufficient documentation

## 2015-08-19 DIAGNOSIS — K5909 Other constipation: Secondary | ICD-10-CM | POA: Insufficient documentation

## 2015-08-19 DIAGNOSIS — K5790 Diverticulosis of intestine, part unspecified, without perforation or abscess without bleeding: Secondary | ICD-10-CM | POA: Insufficient documentation

## 2015-08-19 DIAGNOSIS — Z90710 Acquired absence of both cervix and uterus: Secondary | ICD-10-CM | POA: Insufficient documentation

## 2015-08-19 DIAGNOSIS — Z7982 Long term (current) use of aspirin: Secondary | ICD-10-CM | POA: Insufficient documentation

## 2015-08-19 DIAGNOSIS — Z87891 Personal history of nicotine dependence: Secondary | ICD-10-CM | POA: Insufficient documentation

## 2015-08-19 DIAGNOSIS — Z9049 Acquired absence of other specified parts of digestive tract: Secondary | ICD-10-CM | POA: Diagnosis not present

## 2015-08-19 DIAGNOSIS — Z9889 Other specified postprocedural states: Secondary | ICD-10-CM | POA: Insufficient documentation

## 2015-08-19 DIAGNOSIS — I10 Essential (primary) hypertension: Secondary | ICD-10-CM | POA: Diagnosis not present

## 2015-08-19 DIAGNOSIS — Z7901 Long term (current) use of anticoagulants: Secondary | ICD-10-CM | POA: Insufficient documentation

## 2015-08-19 DIAGNOSIS — X58XXXA Exposure to other specified factors, initial encounter: Secondary | ICD-10-CM | POA: Insufficient documentation

## 2015-08-19 DIAGNOSIS — Z79899 Other long term (current) drug therapy: Secondary | ICD-10-CM | POA: Insufficient documentation

## 2015-08-19 DIAGNOSIS — M1990 Unspecified osteoarthritis, unspecified site: Secondary | ICD-10-CM | POA: Insufficient documentation

## 2015-08-19 DIAGNOSIS — J449 Chronic obstructive pulmonary disease, unspecified: Secondary | ICD-10-CM | POA: Diagnosis not present

## 2015-08-19 DIAGNOSIS — M810 Age-related osteoporosis without current pathological fracture: Secondary | ICD-10-CM | POA: Insufficient documentation

## 2015-08-19 DIAGNOSIS — Z885 Allergy status to narcotic agent status: Secondary | ICD-10-CM | POA: Diagnosis not present

## 2015-08-19 DIAGNOSIS — E785 Hyperlipidemia, unspecified: Secondary | ICD-10-CM | POA: Insufficient documentation

## 2015-08-19 DIAGNOSIS — Z419 Encounter for procedure for purposes other than remedying health state, unspecified: Secondary | ICD-10-CM

## 2015-08-19 DIAGNOSIS — K219 Gastro-esophageal reflux disease without esophagitis: Secondary | ICD-10-CM | POA: Diagnosis not present

## 2015-08-19 DIAGNOSIS — D649 Anemia, unspecified: Secondary | ICD-10-CM | POA: Diagnosis not present

## 2015-08-19 DIAGNOSIS — S32040A Wedge compression fracture of fourth lumbar vertebra, initial encounter for closed fracture: Secondary | ICD-10-CM | POA: Diagnosis present

## 2015-08-19 DIAGNOSIS — E1151 Type 2 diabetes mellitus with diabetic peripheral angiopathy without gangrene: Secondary | ICD-10-CM | POA: Diagnosis not present

## 2015-08-19 DIAGNOSIS — G500 Trigeminal neuralgia: Secondary | ICD-10-CM | POA: Insufficient documentation

## 2015-08-19 HISTORY — PX: KYPHOPLASTY: SHX5884

## 2015-08-19 SURGERY — KYPHOPLASTY
Anesthesia: Monitor Anesthesia Care | Wound class: Clean

## 2015-08-19 MED ORDER — ONDANSETRON HCL 4 MG/2ML IJ SOLN
INTRAMUSCULAR | Status: DC | PRN
  Administered 2015-08-19: 4 mg via INTRAVENOUS

## 2015-08-19 MED ORDER — PROPOFOL 500 MG/50ML IV EMUL
INTRAVENOUS | Status: DC | PRN
  Administered 2015-08-19: 50 ug/kg/min via INTRAVENOUS

## 2015-08-19 MED ORDER — IOPAMIDOL (ISOVUE-M 200) INJECTION 41%
INTRAMUSCULAR | Status: DC | PRN
  Administered 2015-08-19: 1 mL

## 2015-08-19 MED ORDER — CEFAZOLIN SODIUM-DEXTROSE 2-4 GM/100ML-% IV SOLN
INTRAVENOUS | Status: AC
  Filled 2015-08-19: qty 100

## 2015-08-19 MED ORDER — FENTANYL CITRATE (PF) 100 MCG/2ML IJ SOLN
25.0000 ug | INTRAMUSCULAR | Status: DC | PRN
  Administered 2015-08-19 (×4): 25 ug via INTRAVENOUS

## 2015-08-19 MED ORDER — OXYCODONE HCL 5 MG PO TABS: 5.0000 mg | ORAL_TABLET | ORAL | 0 refills | Status: DC | PRN

## 2015-08-19 MED ORDER — IOPAMIDOL (ISOVUE-M 200) INJECTION 41%
INTRAMUSCULAR | Status: AC
  Filled 2015-08-19: qty 10

## 2015-08-19 MED ORDER — ONDANSETRON HCL 4 MG/2ML IJ SOLN: 4.0000 mg | Freq: Once | INTRAMUSCULAR | Status: DC | PRN

## 2015-08-19 MED ORDER — CEFAZOLIN SODIUM-DEXTROSE 2-4 GM/100ML-% IV SOLN: 2.0000 g | Freq: Once | INTRAVENOUS | Status: DC

## 2015-08-19 MED ORDER — LIDOCAINE HCL (PF) 1 % IJ SOLN
INTRAMUSCULAR | Status: AC
  Filled 2015-08-19: qty 30

## 2015-08-19 MED ORDER — BUPIVACAINE-EPINEPHRINE (PF) 0.5% -1:200000 IJ SOLN
INTRAMUSCULAR | Status: AC
  Filled 2015-08-19: qty 30

## 2015-08-19 MED ORDER — FENTANYL CITRATE (PF) 100 MCG/2ML IJ SOLN
INTRAMUSCULAR | Status: AC
  Administered 2015-08-19: 25 ug via INTRAVENOUS
  Filled 2015-08-19: qty 2

## 2015-08-19 MED ORDER — MIDAZOLAM HCL 2 MG/2ML IJ SOLN
INTRAMUSCULAR | Status: DC | PRN
  Administered 2015-08-19: .5 mg via INTRAVENOUS

## 2015-08-19 MED ORDER — LIDOCAINE HCL 1 % IJ SOLN
INTRAMUSCULAR | Status: DC | PRN
  Administered 2015-08-19: 20 mL
  Administered 2015-08-19: 10 mL

## 2015-08-19 MED ORDER — FENTANYL CITRATE (PF) 100 MCG/2ML IJ SOLN
INTRAMUSCULAR | Status: DC | PRN
  Administered 2015-08-19 (×2): 25 ug via INTRAVENOUS

## 2015-08-19 MED ORDER — CEFAZOLIN SODIUM-DEXTROSE 2-3 GM-% IV SOLR
INTRAVENOUS | Status: DC | PRN
  Administered 2015-08-19: 2 g via INTRAVENOUS

## 2015-08-19 MED ORDER — PROPOFOL 10 MG/ML IV BOLUS
INTRAVENOUS | Status: DC | PRN
  Administered 2015-08-19: 20 mg via INTRAVENOUS
  Administered 2015-08-19 (×2): 10 mg via INTRAVENOUS

## 2015-08-19 MED ORDER — BUPIVACAINE-EPINEPHRINE (PF) 0.5% -1:200000 IJ SOLN
INTRAMUSCULAR | Status: DC | PRN
  Administered 2015-08-19: 20 mL via PERINEURAL

## 2015-08-19 MED ORDER — LACTATED RINGERS IV SOLN
INTRAVENOUS | Status: DC
  Administered 2015-08-19: 17:00:00 via INTRAVENOUS

## 2015-08-19 SURGICAL SUPPLY — 16 items
CEMENT BONE KYPHON CDS (Cement) ×2 IMPLANT
CEMENT KYPHON CX01A KIT/MIXER (Cement) ×3 IMPLANT
DEVICE BIOPSY BONE KYPHX (INSTRUMENTS) ×3 IMPLANT
DRAPE C-ARM XRAY 36X54 (DRAPES) ×3 IMPLANT
DURAPREP 26ML APPLICATOR (WOUND CARE) ×3 IMPLANT
GLOVE BIO SURGEON STRL SZ7.5 (GLOVE) ×4 IMPLANT
GLOVE SURG ORTHO 9.0 STRL STRW (GLOVE) ×3 IMPLANT
GOWN SRG 2XL LVL 4 RGLN SLV (GOWNS) ×1 IMPLANT
GOWN STRL NON-REIN 2XL LVL4 (GOWNS) ×3
GOWN STRL REUS W/ TWL LRG LVL3 (GOWN DISPOSABLE) ×1 IMPLANT
GOWN STRL REUS W/TWL LRG LVL3 (GOWN DISPOSABLE) ×3
LIQUID BAND (GAUZE/BANDAGES/DRESSINGS) ×3 IMPLANT
PACK KYPHOPLASTY (MISCELLANEOUS) ×3 IMPLANT
STRAP SAFETY BODY (MISCELLANEOUS) ×3 IMPLANT
TRAY KYPHOPAK 15/3 EXPRESS 1ST (MISCELLANEOUS) ×1 IMPLANT
TRAY KYPHOPAK 20/3 EXPRESS 1ST (MISCELLANEOUS) ×3 IMPLANT

## 2015-08-19 NOTE — Discharge Instructions (Addendum)
Remove Band-Aid on Thursday. Okay to shower starting tomorrow. Resume Plavix tomorrow morning Activity as tolerated starting tomorrow

## 2015-08-19 NOTE — Anesthesia Procedure Notes (Signed)
Procedure Name: MAC Date/Time: 08/19/2015 4:52 PM Performed by: Allean Found Pre-anesthesia Checklist: Patient identified, Emergency Drugs available, Suction available, Patient being monitored and Timeout performed Patient Re-evaluated:Patient Re-evaluated prior to inductionOxygen Delivery Method: Nasal cannula Intubation Type: IV induction

## 2015-08-19 NOTE — Op Note (Signed)
08/19/2015  5:46 PM  PATIENT:  Natasha [name]  82 y.o. female  PRE-OPERATIVE DIAGNOSIS:  L4 compression fracture       POST-OPERATIVE DIAGNOSIS:  L4 compression fracture       PROCEDURE:  Procedure(s): KYPHOPLASTY (N/A)  SURGEON: Laurene Footman, MD  ASSISTANTS: None  ANESTHESIA:   local and MAC  EBL:  Total I/O In: 800 [I.V.:800] Out: 5 [Blood:5]  BLOOD ADMINISTERED:none  DRAINS: none   LOCAL MEDICATIONS USED:  MARCAINE    and XYLOCAINE   SPECIMEN:  Source of Specimen:  L4 vertebral body  DISPOSITION OF SPECIMEN:  PATHOLOGY  COUNTS:  YES  TOURNIQUET:  * No tourniquets in log *  IMPLANTS: Bone cement  DICTATION: .Dragon Dictation patient was brought the operating room and after adequate sedation was given, patient was placed prone on the operative table. C-arm was brought in and good visualization in both AP and lateral projections was obtained using 2C arms. After patient identification and timeout procedures were completed, 10 cc 1% Xylocaine was infiltrated on the right side a subcutaneous layer. The back was then prepped and draped in sterile fashion and repeat timeout procedure carried out. Spinal needle was used to get local anesthetic down to the L4 pedicle on the right side with a mixture of 1% Xylocaine and half percent Sensorcaine with epinephrine proximally 15 cc of each. After allowing this to set a small incision was made and a trochars advanced and extrapedicular approach into the L4 vertebral body. Biopsy was obtained followed by drilling and placement of a balloon inflation to about 3 cc. When the cement was the appropriate consistency it was used to fill the vertebral body and get good interdigitation using a proximally 4 to have cc of bone cement. There is no extravasation. After the cement had set the trochars removed and permanent C-arm views were obtained. The wound was closed using Dermabond and that was covered with a Band-Aid  PLAN OF CARE: Discharge  to home after PACU  PATIENT DISPOSITION:  PACU - hemodynamically stable.

## 2015-08-19 NOTE — Anesthesia Postprocedure Evaluation (Signed)
Anesthesia Post Note  Patient: Natasha [name]  Procedure(s) Performed: Procedure(s) (LRB): KYPHOPLASTY (N/A)  Patient location during evaluation: PACU Anesthesia Type: General Level of consciousness: awake and alert Pain management: pain level controlled Vital Signs Assessment: post-procedure vital signs reviewed and stable Respiratory status: spontaneous breathing, nonlabored ventilation, respiratory function stable and patient connected to nasal cannula oxygen Cardiovascular status: blood pressure returned to baseline and stable Postop Assessment: no signs of nausea or vomiting Anesthetic complications: no    Last Vitals:  Vitals:   08/19/15 1815 08/19/15 1832  BP: (!) 123/54 (!) 163/69  Pulse: 79 86  Resp: 16 20  Temp: 36.4 C (!) 35.7 C    Last Pain:  Vitals:   08/19/15 1832  TempSrc: Tympanic  PainSc: 3                  Martha Clan

## 2015-08-19 NOTE — H&P (Signed)
Reviewed paper H+P, will be scanned into chart. No changes noted.  

## 2015-08-19 NOTE — Anesthesia Preprocedure Evaluation (Addendum)
Anesthesia Evaluation  Patient identified by MRN, date of birth, ID band Patient awake    Reviewed: Allergy & Precautions, NPO status , Patient's Chart, lab work & pertinent test results, reviewed documented beta blocker date and time   Airway Mallampati: II  TM Distance: >3 FB     Dental  (+) Chipped   Pulmonary asthma , pneumonia, resolved, former smoker,           Cardiovascular hypertension, Pt. on medications + Peripheral Vascular Disease       Neuro/Psych    GI/Hepatic GERD  ,  Endo/Other    Renal/GU      Musculoskeletal  (+) Arthritis ,   Abdominal   Peds  Hematology  (+) anemia ,   Anesthesia Other Findings Decreased hearing. Anemic Hb 11.9. EKG ok.  Reproductive/Obstetrics                            Anesthesia Physical Anesthesia Plan  ASA: III  Anesthesia Plan: MAC   Post-op Pain Management:    Induction:   Airway Management Planned:   Additional Equipment:   Intra-op Plan:   Post-operative Plan:   Informed Consent: I have reviewed the patients History and Physical, chart, labs and discussed the procedure including the risks, benefits and alternatives for the proposed anesthesia with the patient or authorized representative who has indicated his/her understanding and acceptance.     Plan Discussed with: CRNA  Anesthesia Plan Comments:         Anesthesia Quick Evaluation

## 2015-08-19 NOTE — Transfer of Care (Signed)
Immediate Anesthesia Transfer of Care Note  Patient: Natasha [name]  Procedure(s) Performed: Procedure(s): KYPHOPLASTY (N/A)  Patient Location: PACU  Anesthesia Type:MAC  Level of Consciousness: awake, alert  and oriented  Airway & Oxygen Therapy: Patient Spontanous Breathing and Patient connected to nasal cannula oxygen  Post-op Assessment: Report given to RN and Post -op Vital signs reviewed and stable  Post vital signs: Reviewed and stable  Last Vitals:  Vitals:   08/19/15 1442 08/19/15 1745  BP: (!) 150/71 (!) (P) 154/63  Pulse: 76 75  Resp: 16 22  Temp: 36.8 C (P) 37.1 C    Last Pain:  Vitals:   08/19/15 1442  TempSrc: Oral  PainSc: 7          Complications: No apparent anesthesia complications

## 2015-08-20 ENCOUNTER — Encounter: Payer: Self-pay | Admitting: Orthopedic Surgery

## 2015-08-21 ENCOUNTER — Encounter: Payer: Self-pay | Admitting: Emergency Medicine

## 2015-08-21 ENCOUNTER — Emergency Department: Payer: Medicare Other

## 2015-08-21 ENCOUNTER — Emergency Department
Admission: EM | Admit: 2015-08-21 | Discharge: 2015-08-21 | Disposition: A | Discharge status: Home / Self Care | Payer: Medicare Other | Source: Home / Self Care | Attending: Emergency Medicine | Admitting: Emergency Medicine

## 2015-08-21 DIAGNOSIS — Z7951 Long term (current) use of inhaled steroids: Secondary | ICD-10-CM

## 2015-08-21 DIAGNOSIS — A419 Sepsis, unspecified organism: Secondary | ICD-10-CM | POA: Diagnosis not present

## 2015-08-21 DIAGNOSIS — I10 Essential (primary) hypertension: Secondary | ICD-10-CM | POA: Insufficient documentation

## 2015-08-21 DIAGNOSIS — Z87891 Personal history of nicotine dependence: Secondary | ICD-10-CM | POA: Insufficient documentation

## 2015-08-21 DIAGNOSIS — Z7982 Long term (current) use of aspirin: Secondary | ICD-10-CM

## 2015-08-21 DIAGNOSIS — M8448XD Pathological fracture, other site, subsequent encounter for fracture with routine healing: Secondary | ICD-10-CM | POA: Diagnosis present

## 2015-08-21 DIAGNOSIS — Z9181 History of falling: Secondary | ICD-10-CM

## 2015-08-21 DIAGNOSIS — Z9621 Cochlear implant status: Secondary | ICD-10-CM | POA: Diagnosis present

## 2015-08-21 DIAGNOSIS — Z79899 Other long term (current) drug therapy: Secondary | ICD-10-CM

## 2015-08-21 DIAGNOSIS — G2581 Restless legs syndrome: Secondary | ICD-10-CM | POA: Diagnosis present

## 2015-08-21 DIAGNOSIS — R0902 Hypoxemia: Secondary | ICD-10-CM | POA: Diagnosis present

## 2015-08-21 DIAGNOSIS — E785 Hyperlipidemia, unspecified: Secondary | ICD-10-CM | POA: Diagnosis present

## 2015-08-21 DIAGNOSIS — J45909 Unspecified asthma, uncomplicated: Secondary | ICD-10-CM | POA: Insufficient documentation

## 2015-08-21 DIAGNOSIS — I739 Peripheral vascular disease, unspecified: Secondary | ICD-10-CM | POA: Diagnosis present

## 2015-08-21 DIAGNOSIS — M79662 Pain in left lower leg: Secondary | ICD-10-CM | POA: Insufficient documentation

## 2015-08-21 DIAGNOSIS — Z8249 Family history of ischemic heart disease and other diseases of the circulatory system: Secondary | ICD-10-CM

## 2015-08-21 DIAGNOSIS — M79605 Pain in left leg: Secondary | ICD-10-CM

## 2015-08-21 DIAGNOSIS — Z7901 Long term (current) use of anticoagulants: Secondary | ICD-10-CM

## 2015-08-21 DIAGNOSIS — I119 Hypertensive heart disease without heart failure: Secondary | ICD-10-CM | POA: Diagnosis present

## 2015-08-21 DIAGNOSIS — Z803 Family history of malignant neoplasm of breast: Secondary | ICD-10-CM

## 2015-08-21 DIAGNOSIS — Z9981 Dependence on supplemental oxygen: Secondary | ICD-10-CM

## 2015-08-21 DIAGNOSIS — K219 Gastro-esophageal reflux disease without esophagitis: Secondary | ICD-10-CM | POA: Diagnosis present

## 2015-08-21 DIAGNOSIS — J189 Pneumonia, unspecified organism: Secondary | ICD-10-CM | POA: Diagnosis present

## 2015-08-21 DIAGNOSIS — Y95 Nosocomial condition: Secondary | ICD-10-CM | POA: Diagnosis present

## 2015-08-21 DIAGNOSIS — J440 Chronic obstructive pulmonary disease with acute lower respiratory infection: Secondary | ICD-10-CM | POA: Diagnosis present

## 2015-08-21 DIAGNOSIS — I743 Embolism and thrombosis of arteries of the lower extremities: Secondary | ICD-10-CM | POA: Diagnosis present

## 2015-08-21 LAB — SURGICAL PATHOLOGY

## 2015-08-21 NOTE — ED Provider Notes (Signed)
[facility] Emergency Department Provider Note   ____________________________________________   First MD Initiated Contact with Patient 08/21/15 1445     (approximate)  I have reviewed the triage vital signs and the nursing notes.   HISTORY  Chief Complaint Leg Pain   HPI Natasha [name] is a 82 y.o. female with a history of recent kyphoplasty on 08/19/2015 was presenting to the emergency department today with left lower summary pain. She says the pain is sharp and only is there when she walks. She had a 5 day period where she was off of her Xarelto and on Lovenox. As of 2 days ago she is back on her eliquis but has begun to have left lower extremity pain which is similar to this past March when she required a procedure to relieve a clot in her left arterial bypass graft in her left lower extremity. She is pain-free at this time when she is sitting. She called her vascular surgeon this morning, Dr. due, who recommended that she come to the emergency department for further workup. She says the pain is usually down the front of her thigh. It then, it can radiate down to the foot.   Past Medical History:  Diagnosis Date  . Anemia   . Arthritis   . Asthma   . Bilateral pneumonia may 2017  . Bronchitis   . Cochlear implant in place    bilateral, Natasha [name], 04/26/2007   . Gallstones 2015  . GERD (gastroesophageal reflux disease)   . H/O blood clots 2014  . History of home oxygen therapy    at night  . Hypertension   . Peripheral vascular disease ([place])   . Restless leg syndrome     Patient Active Problem List   Diagnosis Date Noted  . HCAP (healthcare-associated pneumonia) 06/27/2015  . Pneumonia 06/07/2015  . Hypotension 03/11/2015  . 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 Ischemic leg 02/27/2015  . Calculus of gallbladder with other cholecystitis, without mention of obstruction 04/20/2013    Past Surgical History:    Procedure Laterality Date  . ABDOMINAL HYSTERECTOMY  1960  . APPENDECTOMY  1946  . BREAST BIOPSY Bilateral    negative  . CHOLECYSTECTOMY  04-24-13  . COCHLEAR IMPLANT  2009  . COLONOSCOPY  2015   Dr. Rayann Heman   . EYE SURGERY  2008,2009   cataract  . KYPHOPLASTY N/A 08/19/2015   Procedure: KYPHOPLASTY;  Surgeon: Hessie Knows, MD;  Location: [facility] ORS;  Service: Orthopedics;  Laterality: N/A;  . PERIPHERAL VASCULAR CATHETERIZATION N/A 02/27/2015   Procedure: Abdominal Aortogram w/Lower Extremity;  Surgeon: Algernon Huxley, MD;  Location: [place] [facility];  Service: Cardiovascular;  Laterality: N/A;  . PERIPHERAL VASCULAR CATHETERIZATION  02/27/2015   Procedure: Lower Extremity Intervention;  Surgeon: Algernon Huxley, MD;  Location: [place] [facility];  Service: Cardiovascular;;  . PERIPHERAL VASCULAR CATHETERIZATION Left 02/28/2015   Procedure: Lower Extremity Angiography;  Surgeon: Algernon Huxley, MD;  Location: [place] [facility];  Service: Cardiovascular;  Laterality: Left;  . PERIPHERAL VASCULAR CATHETERIZATION  02/28/2015   Procedure: Lower Extremity Intervention;  Surgeon: Algernon Huxley, MD;  Location: [place] [facility];  Service: Cardiovascular;;  . PERIPHERAL VASCULAR CATHETERIZATION Left 05/22/2015   Procedure: Lower Extremity Angiography;  Surgeon: Algernon Huxley, MD;  Location: [place] [facility];  Service: Cardiovascular;  Laterality: Left;  . PERIPHERAL VASCULAR CATHETERIZATION  05/22/2015   Procedure: Lower Extremity Intervention;  Surgeon: Corene Cornea  Bunnie Domino, MD;  Location: [place] [facility];  Service: Cardiovascular;;  . PERIPHERAL VASCULAR CATHETERIZATION Left 05/23/2015   Procedure: Lower Extremity Angiography;  Surgeon: Algernon Huxley, MD;  Location: [place] [facility];  Service: Cardiovascular;  Laterality: Left;  . PERIPHERAL VASCULAR CATHETERIZATION  05/23/2015   Procedure: Lower Extremity Intervention;  Surgeon: Algernon Huxley, MD;  Location: [place] [facility];  Service: Cardiovascular;;   . stent placement  2006-2014   multiple stent placements in legs    Prior to Admission medications   Medication Sig Start Date End Date Taking? Authorizing Provider  albuterol (PROVENTIL HFA;VENTOLIN HFA) 108 (90 BASE) MCG/ACT inhaler Inhale 2 puffs into the lungs 2 (two) times daily as needed for wheezing or shortness of breath.     Historical Provider, MD  albuterol (PROVENTIL) (2.5 MG/3ML) 0.083% nebulizer solution Take 3 mLs (2.5 mg total) by nebulization every 6 (six) hours as needed for wheezing or shortness of breath. 06/11/15   Loletha Grayer, MD  aspirin EC 325 MG tablet Take 325 mg by mouth at bedtime.     Historical Provider, MD  azithromycin (ZITHROMAX) 250 MG tablet Take 1 tablet (250 mg total) by mouth daily. Take first 2 tablets together, then 1 every day until finished. Patient not taking: Reported on 08/15/2015 08/04/15   Blanchie Dessert, MD  budesonide (PULMICORT) 180 MCG/ACT inhaler Inhale 2 puffs into the lungs 2 (two) times daily.    Historical Provider, MD  Calcium Carbonate-Vit D-Min (CALCIUM 1200) 1200-1000 MG-UNIT CHEW Chew 1 tablet by mouth daily.    Historical Provider, MD  cholecalciferol (VITAMIN D) 1000 units tablet Take 1,000 Units by mouth daily.    Historical Provider, MD  diltiazem (CARDIZEM CD) 180 MG 24 hr capsule Take 180 mg by mouth daily.    Historical Provider, MD  docusate sodium (COLACE) 100 MG capsule Take 400 mg by mouth at bedtime.    Historical Provider, MD  Esomeprazole Magnesium (NEXIUM PO) Take 22.3 mg by mouth 2 (two) times daily.     Historical Provider, MD  ferrous sulfate 325 (65 FE) MG tablet Take 1 tablet (325 mg total) by mouth daily. 05/24/15   Kimberly A Stegmayer, PA-C  furosemide (LASIX) 20 MG tablet Take 20 mg by mouth 2 (two) times daily.    Historical Provider, MD  gabapentin (NEURONTIN) 300 MG capsule Take 300 mg by mouth 2 (two) times daily.    Historical Provider, MD  glucosamine-chondroitin 500-400 MG tablet Take 1 tablet by mouth  daily.    Historical Provider, MD  MAGNESIUM PO Take 1 tablet by mouth daily.    Historical Provider, MD  Melatonin 5 MG TABS Take 5 mg by mouth at bedtime.     Historical Provider, MD  montelukast (SINGULAIR) 10 MG tablet Take 10 mg by mouth daily.    Historical Provider, MD  oxyCODONE (ROXICODONE) 5 MG immediate release tablet Take 1 tablet (5 mg total) by mouth every 4 (four) hours as needed for severe pain. 08/19/15   Hessie Knows, MD  oxyCODONE-acetaminophen (PERCOCET/ROXICET) 5-325 MG tablet Take 1 tablet by mouth daily as needed for severe pain.     Historical Provider, MD  polyethylene glycol (MIRALAX / GLYCOLAX) packet Take 17 g by mouth daily as needed. Patient taking differently: Take 17 g by mouth daily as needed for mild constipation or moderate constipation. Mix in 8 oz liquid and drink 06/11/15   Loletha Grayer, MD  potassium chloride SA (K-DUR,KLOR-CON) 20 MEQ tablet Take 20  mEq by mouth 3 (three) times daily. 40 mEq every morning, 20 mEq every afternoon, and 40 mEq at bedtime     Historical Provider, MD  pramipexole (MIRAPEX) 0.5 MG tablet Take 0.5 mg by mouth 2 (two) times daily. 06/30/15   Historical Provider, MD  rivaroxaban (XARELTO) 20 MG TABS tablet Take 20 mg by mouth daily with supper.     Historical Provider, MD  simvastatin (ZOCOR) 20 MG tablet Take 20 mg by mouth at bedtime. 07/03/15   Historical Provider, MD  temazepam (RESTORIL) 15 MG capsule Take 1 capsule (15 mg total) by mouth at bedtime as needed. for sleep Patient taking differently: Take 15 mg by mouth at bedtime as needed for sleep.  06/11/15   Loletha Grayer, MD    Allergies Codeine; Hydrocodone; Levofloxacin; and Tramadol  Family History  Problem Relation Age of Onset  . Heart disease Mother   . Heart disease Father   . Cancer Sister     lung  . Breast cancer Daughter     Social History Social History  Substance Use Topics  . Smoking status: Former Smoker    Types: Cigarettes  . Smokeless tobacco:  Never Used  . Alcohol use No    Review of Systems Constitutional: No fever/chills Eyes: No visual changes. ENT: No sore throat. Cardiovascular: Denies chest pain. Respiratory: Denies shortness of breath. Gastrointestinal: No abdominal pain.  No nausea, no vomiting.  No diarrhea.  No constipation. Genitourinary: Negative for dysuria. Musculoskeletal: Negative for back pain. Skin: Negative for rash. Neurological: Negative for headaches, focal weakness or numbness.  10-point ROS otherwise negative.  ____________________________________________   PHYSICAL EXAM:  VITAL SIGNS: ED Triage Vitals  Enc Vitals Group     BP 08/21/15 1444 (!) 133/54     Pulse Rate 08/21/15 1444 82     Resp 08/21/15 1444 20     Temp 08/21/15 1444 99.3 F (37.4 C)     Temp Source 08/21/15 1444 Oral     SpO2 08/21/15 1444 93 %     Weight 08/21/15 1438 142 lb (64.4 kg)     Height 08/21/15 1438 4\' 11"  (1.499 m)     Head Circumference --      Peak Flow --      Pain Score 08/21/15 1438 8     Pain Loc --      Pain Edu? --      Excl. in [place]? --     Constitutional: Alert and oriented. Well appearing and in no acute distress. Eyes: Conjunctivae are normal. PERRL. EOMI. Head: Atraumatic. Nose: No congestion/rhinnorhea. Mouth/Throat: Mucous membranes are moist.   Neck: No stridor.   Cardiovascular: Normal rate, regular rhythm. Grossly normal heart sounds.  Good peripheral circulation With intact bilateral posterior tibial as well as dorsalis pedis pulses. Respiratory: Normal respiratory effort.  No retractions. Lungs CTAB. Gastrointestinal: Soft and nontender. No distention.  Musculoskeletal: mild ttp to the quadriceps muscle group and to the popliteal fossa without any swelling, discoloration.  The extremity is warm.  She has full and active rom in the stretcher which is pain free.  Negative straight leg raises bilaterally. Neurologic:  Normal speech and language. No gross focal neurologic deficits are  appreciated. 5/5 strength to the bilateral lower extremities.   Skin:  Skin is warm, dry and intact. No rash noted. Psychiatric: Mood and affect are normal. Speech and behavior are normal.  ____________________________________________   LABS (all labs ordered are listed, but only abnormal results are displayed)  Labs Reviewed - No data to display ____________________________________________  EKG   ____________________________________________  RADIOLOGY  US Venous Img Lower Unilateral Left (Accession [id number]) (Order [id number])  Imaging  Date: 08/21/2015 Department: [facility] EMERGENCY DEPARTMENT Released By/Authorizing: Orbie Pyo, MD (auto-released)  PACS Images   Show images for US Venous Img Lower Unilateral Left  Study Result   CLINICAL DATA:  Left leg pain  EXAM: LEFT LOWER EXTREMITY VENOUS DUPLEX ULTRASOUND  TECHNIQUE: Doppler venous assessment of the left lower extremity deep venous system was performed, including characterization of spectral flow, compressibility, and phasicity.  COMPARISON:  None.  FINDINGS: There is complete compressibility of the left common femoral, femoral, and popliteal veins. Doppler analysis demonstrates respiratory phasicity and augmentation of flow with calf compression. No obvious superficial vein or calf vein thrombosis.  IMPRESSION: No evidence of left lower extremity DVT.   Electronically Signed   By: Marybelle Killings M.D.   On: 08/21/2015 15:55     ____________________________________________   PROCEDURES  Procedure(s) performed:   Procedures  Critical Care performed:   ____________________________________________   INITIAL IMPRESSION / ASSESSMENT AND PLAN / ED COURSE  Pertinent labs & imaging results that were available during my care of the patient were reviewed by me and considered in my medical decision making (see chart for details).  Patient without any  circulatory deficits. Pulses are intact throughout. I will proceed with a DVT ultrasound because of the patient's recent procedure. She is back on her Xarelto. I also discussed case Dr. Lucky Cowboy who agrees that if the ultrasound for DVT is negative that she may follow-up in office for further evaluation.  Clinical Course   ----------------------------------------- 4:22 PM on 08/21/2015 -----------------------------------------  Patient remains resting comfortably in her room. I updated her as well as her husband asked her ultrasound results. They already have an appointment scheduled for 1 PM tomorrow with Dr. Lucky Cowboy.  The patient says that she has been using oxycodone at home which has helped with her pain. I also recommended icy hot or another salve to the muscle for any muscle component of her pain. She is understanding of this plan and willing to comply. Will be discharged home. Unclear exact cause of the pain. However, with the patient's pulses being intact and a negative DVT or sound as well as strength and sensation intact it is highly unlikely that a emergency condition exists.  ____________________________________________   FINAL CLINICAL IMPRESSION(S) / ED DIAGNOSES  Left lower extremity pain.    NEW MEDICATIONS STARTED DURING THIS VISIT:  New Prescriptions   No medications on file     Note:  This document was prepared using Dragon voice recognition software and may include unintentional dictation errors.    Orbie Pyo, MD 08/21/15 [phone]

## 2015-08-21 NOTE — ED Triage Notes (Signed)
Patient presents to the ED with left leg pain.  Patient states she is normally on blood thinners but they were stopped in order for her to have a recent back surgery.  Patient states her leg pain is in the leg that she has had a bypass and had clots in that leg in the past.  Patient states Dr. Lucky Cowboy instructed her to come to the ED for an [place] when she called him.

## 2015-08-24 ENCOUNTER — Encounter: Payer: Self-pay | Admitting: Emergency Medicine

## 2015-08-24 ENCOUNTER — Emergency Department: Payer: Medicare Other

## 2015-08-24 ENCOUNTER — Inpatient Hospital Stay: Payer: Medicare Other

## 2015-08-24 ENCOUNTER — Inpatient Hospital Stay
Admission: EM | Admit: 2015-08-24 | Discharge: 2015-08-26 | DRG: 871 | Disposition: A | Discharge status: Home Health | Payer: Medicare Other | Attending: Internal Medicine | Admitting: Internal Medicine

## 2015-08-24 DIAGNOSIS — Y95 Nosocomial condition: Secondary | ICD-10-CM | POA: Diagnosis present

## 2015-08-24 DIAGNOSIS — Z7982 Long term (current) use of aspirin: Secondary | ICD-10-CM | POA: Diagnosis not present

## 2015-08-24 DIAGNOSIS — Z7901 Long term (current) use of anticoagulants: Secondary | ICD-10-CM | POA: Diagnosis not present

## 2015-08-24 DIAGNOSIS — Z8249 Family history of ischemic heart disease and other diseases of the circulatory system: Secondary | ICD-10-CM | POA: Diagnosis not present

## 2015-08-24 DIAGNOSIS — J189 Pneumonia, unspecified organism: Secondary | ICD-10-CM | POA: Diagnosis present

## 2015-08-24 DIAGNOSIS — K219 Gastro-esophageal reflux disease without esophagitis: Secondary | ICD-10-CM | POA: Diagnosis present

## 2015-08-24 DIAGNOSIS — Z79899 Other long term (current) drug therapy: Secondary | ICD-10-CM | POA: Diagnosis not present

## 2015-08-24 DIAGNOSIS — Z9621 Cochlear implant status: Secondary | ICD-10-CM | POA: Diagnosis present

## 2015-08-24 DIAGNOSIS — Z7951 Long term (current) use of inhaled steroids: Secondary | ICD-10-CM | POA: Diagnosis not present

## 2015-08-24 DIAGNOSIS — M8448XD Pathological fracture, other site, subsequent encounter for fracture with routine healing: Secondary | ICD-10-CM | POA: Diagnosis present

## 2015-08-24 DIAGNOSIS — E785 Hyperlipidemia, unspecified: Secondary | ICD-10-CM | POA: Diagnosis present

## 2015-08-24 DIAGNOSIS — W19XXXA Unspecified fall, initial encounter: Secondary | ICD-10-CM

## 2015-08-24 DIAGNOSIS — I743 Embolism and thrombosis of arteries of the lower extremities: Secondary | ICD-10-CM | POA: Diagnosis present

## 2015-08-24 DIAGNOSIS — J440 Chronic obstructive pulmonary disease with acute lower respiratory infection: Secondary | ICD-10-CM | POA: Diagnosis present

## 2015-08-24 DIAGNOSIS — R0902 Hypoxemia: Secondary | ICD-10-CM

## 2015-08-24 DIAGNOSIS — Z87891 Personal history of nicotine dependence: Secondary | ICD-10-CM | POA: Diagnosis not present

## 2015-08-24 DIAGNOSIS — I739 Peripheral vascular disease, unspecified: Secondary | ICD-10-CM | POA: Diagnosis present

## 2015-08-24 DIAGNOSIS — M549 Dorsalgia, unspecified: Secondary | ICD-10-CM

## 2015-08-24 DIAGNOSIS — Z9981 Dependence on supplemental oxygen: Secondary | ICD-10-CM | POA: Diagnosis not present

## 2015-08-24 DIAGNOSIS — G2581 Restless legs syndrome: Secondary | ICD-10-CM | POA: Diagnosis present

## 2015-08-24 DIAGNOSIS — A419 Sepsis, unspecified organism: Secondary | ICD-10-CM | POA: Diagnosis present

## 2015-08-24 DIAGNOSIS — I70209 Unspecified atherosclerosis of native arteries of extremities, unspecified extremity: Secondary | ICD-10-CM

## 2015-08-24 DIAGNOSIS — Z803 Family history of malignant neoplasm of breast: Secondary | ICD-10-CM | POA: Diagnosis not present

## 2015-08-24 DIAGNOSIS — Z9181 History of falling: Secondary | ICD-10-CM | POA: Diagnosis not present

## 2015-08-24 DIAGNOSIS — I119 Hypertensive heart disease without heart failure: Secondary | ICD-10-CM | POA: Diagnosis present

## 2015-08-24 HISTORY — DX: Cardiomegaly: I51.7

## 2015-08-24 LAB — COMPREHENSIVE METABOLIC PANEL
ALK PHOS: 102 U/L (ref 38–126)
ALT: 18 U/L (ref 14–54)
AST: 35 U/L (ref 15–41)
Albumin: 3.4 g/dL — ABNORMAL LOW (ref 3.5–5.0)
Anion gap: 8 (ref 5–15)
BUN: 19 mg/dL (ref 6–20)
CALCIUM: 9.2 mg/dL (ref 8.9–10.3)
CO2: 25 mmol/L (ref 22–32)
CREATININE: 1.03 mg/dL — AB (ref 0.44–1.00)
Chloride: 98 mmol/L — ABNORMAL LOW (ref 101–111)
GFR calc non Af Amer: 49 mL/min — ABNORMAL LOW (ref >60)
GFR, EST AFRICAN AMERICAN: 56 mL/min — AB (ref >60)
GLUCOSE: 141 mg/dL — AB (ref 65–99)
Potassium: 4.7 mmol/L (ref 3.5–5.1)
SODIUM: 131 mmol/L — AB (ref 135–145)
Total Bilirubin: 0.4 mg/dL (ref 0.3–1.2)
Total Protein: 6.7 g/dL (ref 6.5–8.1)

## 2015-08-24 LAB — CBC
HCT: 34.5 % — ABNORMAL LOW (ref 35.0–47.0)
HEMOGLOBIN: 11.2 g/dL — AB (ref 12.0–16.0)
MCH: 28.6 pg (ref 26.0–34.0)
MCHC: 32.4 g/dL (ref 32.0–36.0)
MCV: 88 fL (ref 80.0–100.0)
Platelets: 292 10*3/uL (ref 150–440)
RBC: 3.91 MIL/uL (ref 3.80–5.20)
RDW: 19.2 % — ABNORMAL HIGH (ref 11.5–14.5)
WBC: 20.2 10*3/uL — ABNORMAL HIGH (ref 3.6–11.0)

## 2015-08-24 LAB — URINALYSIS COMPLETE WITH MICROSCOPIC (ARMC ONLY)
BACTERIA UA: NONE SEEN
Bilirubin Urine: NEGATIVE
Glucose, UA: NEGATIVE mg/dL
HGB URINE DIPSTICK: NEGATIVE
Ketones, ur: NEGATIVE mg/dL
LEUKOCYTES UA: NEGATIVE
NITRITE: NEGATIVE
PH: 5 (ref 5.0–8.0)
PROTEIN: NEGATIVE mg/dL
SPECIFIC GRAVITY, URINE: 1.018 (ref 1.005–1.030)

## 2015-08-24 LAB — APTT: aPTT: 51 seconds — ABNORMAL HIGH (ref 24–36)

## 2015-08-24 LAB — TROPONIN I: Troponin I: <0.03 ng/mL (ref <0.03)

## 2015-08-24 LAB — PROTIME-INR
INR: 2.27
PROTHROMBIN TIME: 25.4 s — AB (ref 11.4–15.2)

## 2015-08-24 LAB — LIPASE, BLOOD: Lipase: 16 U/L (ref 11–51)

## 2015-08-24 MED ORDER — PIPERACILLIN-TAZOBACTAM 3.375 G IVPB
3.3750 g | Freq: Three times a day (TID) | INTRAVENOUS | Status: DC
  Administered 2015-08-25 – 2015-08-26 (×4): 3.375 g via INTRAVENOUS
  Filled 2015-08-24 (×7): qty 50

## 2015-08-24 MED ORDER — RIVAROXABAN 10 MG PO TABS
20.0000 mg | ORAL_TABLET | Freq: Every day | ORAL | Status: DC
  Administered 2015-08-25: 20 mg via ORAL
  Filled 2015-08-24: qty 2

## 2015-08-24 MED ORDER — MAGNESIUM OXIDE 400 (241.3 MG) MG PO TABS
200.0000 mg | ORAL_TABLET | Freq: Every day | ORAL | Status: DC
  Administered 2015-08-25 – 2015-08-26 (×2): 200 mg via ORAL
  Filled 2015-08-24 (×2): qty 1

## 2015-08-24 MED ORDER — MONTELUKAST SODIUM 10 MG PO TABS
10.0000 mg | ORAL_TABLET | Freq: Every day | ORAL | Status: DC
  Administered 2015-08-25 – 2015-08-26 (×2): 10 mg via ORAL
  Filled 2015-08-24 (×3): qty 1

## 2015-08-24 MED ORDER — PRAMIPEXOLE DIHYDROCHLORIDE 0.25 MG PO TABS
0.5000 mg | ORAL_TABLET | Freq: Two times a day (BID) | ORAL | Status: DC
  Administered 2015-08-24 – 2015-08-26 (×4): 0.5 mg via ORAL
  Filled 2015-08-24 (×5): qty 2

## 2015-08-24 MED ORDER — CALCIUM CARBONATE ANTACID 500 MG PO CHEW
2.0000 | CHEWABLE_TABLET | Freq: Every day | ORAL | Status: DC
  Administered 2015-08-24 – 2015-08-26 (×3): 400 mg via ORAL
  Filled 2015-08-24 (×3): qty 2

## 2015-08-24 MED ORDER — MAGNESIUM 200 MG PO TABS: ORAL_TABLET | Freq: Every day | ORAL | Status: DC

## 2015-08-24 MED ORDER — TEMAZEPAM 15 MG PO CAPS: 15.0000 mg | ORAL_CAPSULE | Freq: Every evening | ORAL | Status: DC | PRN

## 2015-08-24 MED ORDER — BUDESONIDE 0.25 MG/2ML IN SUSP
0.2500 mg | Freq: Two times a day (BID) | RESPIRATORY_TRACT | Status: DC
  Administered 2015-08-25 – 2015-08-26 (×3): 0.25 mg via RESPIRATORY_TRACT
  Filled 2015-08-24 (×3): qty 2

## 2015-08-24 MED ORDER — SIMVASTATIN 20 MG PO TABS
20.0000 mg | ORAL_TABLET | Freq: Every day | ORAL | Status: DC
  Administered 2015-08-24 – 2015-08-25 (×2): 20 mg via ORAL
  Filled 2015-08-24 (×2): qty 1

## 2015-08-24 MED ORDER — SODIUM CHLORIDE 0.9% FLUSH
3.0000 mL | Freq: Two times a day (BID) | INTRAVENOUS | Status: DC
  Administered 2015-08-25 – 2015-08-26 (×2): 3 mL via INTRAVENOUS

## 2015-08-24 MED ORDER — ACETAMINOPHEN 650 MG RE SUPP
650.0000 mg | Freq: Four times a day (QID) | RECTAL | Status: DC | PRN
Start: 2015-08-24 — End: 2015-08-26

## 2015-08-24 MED ORDER — MELATONIN 5 MG PO TABS
5.0000 mg | ORAL_TABLET | Freq: Every day | ORAL | Status: DC
  Administered 2015-08-24 – 2015-08-25 (×2): 5 mg via ORAL
  Filled 2015-08-24 (×3): qty 1

## 2015-08-24 MED ORDER — VANCOMYCIN HCL IN DEXTROSE 1-5 GM/200ML-% IV SOLN
1000.0000 mg | Freq: Once | INTRAVENOUS | Status: AC
  Administered 2015-08-24: 1000 mg via INTRAVENOUS
  Filled 2015-08-24: qty 200

## 2015-08-24 MED ORDER — ONDANSETRON HCL 4 MG PO TABS: 4.0000 mg | ORAL_TABLET | Freq: Four times a day (QID) | ORAL | Status: DC | PRN

## 2015-08-24 MED ORDER — ONDANSETRON HCL 4 MG/2ML IJ SOLN: 4.0000 mg | Freq: Four times a day (QID) | INTRAMUSCULAR | Status: DC | PRN

## 2015-08-24 MED ORDER — POLYETHYLENE GLYCOL 3350 17 G PO PACK: 17.0000 g | PACK | Freq: Every day | ORAL | Status: DC | PRN

## 2015-08-24 MED ORDER — VITAMIN D 1000 UNITS PO TABS
1000.0000 [IU] | ORAL_TABLET | Freq: Every day | ORAL | Status: DC
  Administered 2015-08-25 – 2015-08-26 (×2): 1000 [IU] via ORAL
  Filled 2015-08-24 (×3): qty 1

## 2015-08-24 MED ORDER — KETOROLAC TROMETHAMINE 15 MG/ML IJ SOLN: 15.0000 mg | Freq: Four times a day (QID) | INTRAMUSCULAR | Status: DC | PRN

## 2015-08-24 MED ORDER — PANTOPRAZOLE SODIUM 40 MG PO TBEC
40.0000 mg | DELAYED_RELEASE_TABLET | Freq: Every day | ORAL | Status: DC
  Administered 2015-08-25 – 2015-08-26 (×2): 40 mg via ORAL
  Filled 2015-08-24 (×3): qty 1

## 2015-08-24 MED ORDER — GLUCOSAMINE-CHONDROITIN 500-400 MG PO TABS: 1.0000 | ORAL_TABLET | Freq: Every day | ORAL | Status: DC

## 2015-08-24 MED ORDER — ALBUTEROL SULFATE (2.5 MG/3ML) 0.083% IN NEBU: 2.5000 mg | INHALATION_SOLUTION | Freq: Four times a day (QID) | RESPIRATORY_TRACT | Status: DC | PRN

## 2015-08-24 MED ORDER — DOCUSATE SODIUM 100 MG PO CAPS
400.0000 mg | ORAL_CAPSULE | Freq: Every day | ORAL | Status: DC
  Administered 2015-08-24 – 2015-08-25 (×2): 400 mg via ORAL
  Filled 2015-08-24 (×2): qty 4

## 2015-08-24 MED ORDER — ASPIRIN EC 325 MG PO TBEC
325.0000 mg | DELAYED_RELEASE_TABLET | Freq: Every day | ORAL | Status: DC
  Administered 2015-08-24 – 2015-08-25 (×2): 325 mg via ORAL
  Filled 2015-08-24 (×2): qty 1

## 2015-08-24 MED ORDER — ACETAMINOPHEN 325 MG PO TABS
650.0000 mg | ORAL_TABLET | Freq: Four times a day (QID) | ORAL | Status: DC | PRN
  Administered 2015-08-25: 650 mg via ORAL
  Filled 2015-08-24: qty 2

## 2015-08-24 MED ORDER — VANCOMYCIN HCL IN DEXTROSE 750-5 MG/150ML-% IV SOLN
750.0000 mg | INTRAVENOUS | Status: DC
  Filled 2015-08-24: qty 150

## 2015-08-24 MED ORDER — OXYCODONE HCL 5 MG PO TABS
5.0000 mg | ORAL_TABLET | ORAL | Status: DC | PRN
  Administered 2015-08-25 (×3): 5 mg via ORAL
  Filled 2015-08-24 (×3): qty 1

## 2015-08-24 MED ORDER — BUDESONIDE 180 MCG/ACT IN AEPB: 2.0000 | INHALATION_SPRAY | Freq: Two times a day (BID) | RESPIRATORY_TRACT | Status: DC

## 2015-08-24 MED ORDER — CALCIUM 1200 1200-1000 MG-UNIT PO CHEW
1.0000 | CHEWABLE_TABLET | Freq: Every day | ORAL | Status: DC
Start: 2015-08-24 — End: 2015-08-24

## 2015-08-24 MED ORDER — GABAPENTIN 300 MG PO CAPS
300.0000 mg | ORAL_CAPSULE | Freq: Two times a day (BID) | ORAL | Status: DC
  Administered 2015-08-24 – 2015-08-26 (×4): 300 mg via ORAL
  Filled 2015-08-24 (×4): qty 1

## 2015-08-24 MED ORDER — PIPERACILLIN-TAZOBACTAM 3.375 G IVPB
3.3750 g | Freq: Once | INTRAVENOUS | Status: AC
  Administered 2015-08-24: 3.375 g via INTRAVENOUS
  Filled 2015-08-24: qty 50

## 2015-08-24 MED ORDER — FERROUS SULFATE 325 (65 FE) MG PO TABS
325.0000 mg | ORAL_TABLET | Freq: Every day | ORAL | Status: DC
  Administered 2015-08-25 – 2015-08-26 (×2): 325 mg via ORAL
  Filled 2015-08-24 (×3): qty 1

## 2015-08-24 MED ORDER — SODIUM CHLORIDE 0.9 % IV SOLN
INTRAVENOUS | Status: AC
  Administered 2015-08-25: 01:00:00 via INTRAVENOUS

## 2015-08-24 NOTE — ED Provider Notes (Signed)
[facility] Emergency Department Provider Note ____________________________________________   First MD Initiated Contact with Patient 08/24/15 1717     (approximate)  I have reviewed the triage vital signs and the nursing notes.   HISTORY  Chief Complaint Shortness of Breath    HPI Natasha [name] is a 82 y.o. female presents for evaluation of fever cough and shortness of breath for about the last 2 days.  Patient had a recent kyphoplasty, but went back on her Xarelto yesterday. She has seen Dr. dew in the interim and been told that she has another blockage of the artery in her left leg, but aside from putting her on Xarelto they cannot do a procedure to open it because of the proximity to her back surgery.  She denies back pain or bleeding. She reports she had a fever today, has been short of breath and coughing. She feels well overall and does not wish for any pain medication. No abdominal pain nausea or vomiting.  Denies new numbness tingling or pain in the left lower leg.  Took Xarelto this morning  Past Medical History:  Diagnosis Date  . Anemia   . Arthritis   . Asthma   . Bilateral pneumonia may 2017  . Bronchitis   . Cardiomegaly   . Cochlear implant in place    bilateral, [name], 04/26/2007   . Gallstones 2015  . GERD (gastroesophageal reflux disease)   . H/O blood clots 2014  . History of home oxygen therapy    at night  . Hypertension   . Peripheral vascular disease ([place])    with arterial clots- on xarelto  . Restless leg syndrome     Patient Active Problem List   Diagnosis Date Noted  . Sepsis ([place]) 08/24/2015  . HCAP (healthcare-associated pneumonia) 06/27/2015  . Pneumonia 06/07/2015  . Hypotension 03/11/2015  . 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 Ischemic leg 02/27/2015  . Calculus of gallbladder with other cholecystitis, without mention of obstruction 04/20/2013    Past Surgical History:    Procedure Laterality Date  . ABDOMINAL HYSTERECTOMY  1960  . APPENDECTOMY  1946  . BREAST BIOPSY Bilateral    negative  . CHOLECYSTECTOMY  04-24-13  . COCHLEAR IMPLANT  2009  . COLONOSCOPY  2015   Dr. Rayann Heman   . EYE SURGERY  2008,2009   cataract  . KYPHOPLASTY N/A 08/19/2015   Procedure: KYPHOPLASTY;  Surgeon: Hessie Knows, MD;  Location: [facility] ORS;  Service: Orthopedics;  Laterality: N/A;  . PERIPHERAL VASCULAR CATHETERIZATION N/A 02/27/2015   Procedure: Abdominal Aortogram w/Lower Extremity;  Surgeon: Algernon Huxley, MD;  Location: [place] [facility];  Service: Cardiovascular;  Laterality: N/A;  . PERIPHERAL VASCULAR CATHETERIZATION  02/27/2015   Procedure: Lower Extremity Intervention;  Surgeon: Algernon Huxley, MD;  Location: [place] [facility];  Service: Cardiovascular;;  . PERIPHERAL VASCULAR CATHETERIZATION Left 02/28/2015   Procedure: Lower Extremity Angiography;  Surgeon: Algernon Huxley, MD;  Location: [place] [facility];  Service: Cardiovascular;  Laterality: Left;  . PERIPHERAL VASCULAR CATHETERIZATION  02/28/2015   Procedure: Lower Extremity Intervention;  Surgeon: Algernon Huxley, MD;  Location: [place] [facility];  Service: Cardiovascular;;  . PERIPHERAL VASCULAR CATHETERIZATION Left 05/22/2015   Procedure: Lower Extremity Angiography;  Surgeon: Algernon Huxley, MD;  Location: [place] [facility];  Service: Cardiovascular;  Laterality: Left;  . PERIPHERAL VASCULAR CATHETERIZATION  05/22/2015   Procedure: Lower Extremity Intervention;  Surgeon: Algernon Huxley,  MD;  Location: [place] [facility];  Service: Cardiovascular;;  . PERIPHERAL VASCULAR CATHETERIZATION Left 05/23/2015   Procedure: Lower Extremity Angiography;  Surgeon: Algernon Huxley, MD;  Location: [place] [facility];  Service: Cardiovascular;  Laterality: Left;  . PERIPHERAL VASCULAR CATHETERIZATION  05/23/2015   Procedure: Lower Extremity Intervention;  Surgeon: Algernon Huxley, MD;  Location: [place] [facility];  Service: Cardiovascular;;   . stent placement  2006-2014   multiple stent placements in legs    Prior to Admission medications   Medication Sig Start Date End Date Taking? Authorizing Provider  albuterol (PROVENTIL HFA;VENTOLIN HFA) 108 (90 BASE) MCG/ACT inhaler Inhale 2 puffs into the lungs 2 (two) times daily as needed for wheezing or shortness of breath.    Yes Historical Provider, MD  aspirin EC 325 MG tablet Take 325 mg by mouth at bedtime.    Yes Historical Provider, MD  budesonide (PULMICORT) 180 MCG/ACT inhaler Inhale 2 puffs into the lungs 2 (two) times daily.   Yes Historical Provider, MD  Calcium Carbonate-Vit D-Min (CALCIUM 1200) 1200-1000 MG-UNIT CHEW Chew 1 tablet by mouth daily.   Yes Historical Provider, MD  cholecalciferol (VITAMIN D) 1000 units tablet Take 1,000 Units by mouth every morning.    Yes Historical Provider, MD  diltiazem (CARDIZEM CD) 180 MG 24 hr capsule Take 180 mg by mouth every morning.    Yes Historical Provider, MD  docusate sodium (COLACE) 100 MG capsule Take 400 mg by mouth at bedtime.   Yes Historical Provider, MD  Esomeprazole Magnesium (NEXIUM PO) Take 22.3 mg by mouth 2 (two) times daily.    Yes Historical Provider, MD  ferrous sulfate 325 (65 FE) MG tablet Take 1 tablet (325 mg total) by mouth daily. 05/24/15  Yes Kimberly A Stegmayer, PA-C  furosemide (LASIX) 20 MG tablet Take 20 mg by mouth 2 (two) times daily.   Yes Historical Provider, MD  gabapentin (NEURONTIN) 300 MG capsule Take 300 mg by mouth 2 (two) times daily.   Yes Historical Provider, MD  glucosamine-chondroitin 500-400 MG tablet Take 1 tablet by mouth daily.   Yes Historical Provider, MD  MAGNESIUM PO Take 1 tablet by mouth at bedtime.    Yes Historical Provider, MD  Melatonin 5 MG TABS Take 5 mg by mouth at bedtime.    Yes Historical Provider, MD  montelukast (SINGULAIR) 10 MG tablet Take 10 mg by mouth every morning.    Yes Historical Provider, MD  oxyCODONE (ROXICODONE) 5 MG immediate release tablet Take 1  tablet (5 mg total) by mouth every 4 (four) hours as needed for severe pain. 08/19/15  Yes Hessie Knows, MD  oxyCODONE-acetaminophen (PERCOCET/ROXICET) 5-325 MG tablet Take 1 tablet by mouth daily as needed for severe pain.    Yes Historical Provider, MD  polyethylene glycol (MIRALAX / GLYCOLAX) packet Take 17 g by mouth daily as needed. Patient taking differently: Take 17 g by mouth 2 (two) times daily. Mix in 8 oz liquid and drink 06/11/15  Yes Loletha Grayer, MD  potassium chloride SA (K-DUR,KLOR-CON) 20 MEQ tablet Take 20-40 mEq by mouth See admin instructions. Take 2 tablets (40 mEq) by mouth every morning, Take 20 mEq by mouth every afternoon, and Take 2 tablets (40 mEq) by mouth every night at bedtime.   Yes Historical Provider, MD  pramipexole (MIRAPEX) 0.5 MG tablet Take 0.5 mg by mouth 2 (two) times daily. 06/30/15  Yes Historical Provider, MD  rivaroxaban (XARELTO) 20 MG TABS tablet Take 20 mg  by mouth every morning.    Yes Historical Provider, MD  simvastatin (ZOCOR) 20 MG tablet Take 20 mg by mouth at bedtime. 07/03/15  Yes Historical Provider, MD  albuterol (PROVENTIL) (2.5 MG/3ML) 0.083% nebulizer solution Take 3 mLs (2.5 mg total) by nebulization every 6 (six) hours as needed for wheezing or shortness of breath. Patient not taking: Reported on 08/24/2015 06/11/15   Loletha Grayer, MD  azithromycin (ZITHROMAX) 250 MG tablet Take 1 tablet (250 mg total) by mouth daily. Take first 2 tablets together, then 1 every day until finished. Patient not taking: Reported on 08/15/2015 08/04/15   Blanchie Dessert, MD    Allergies Codeine; Hydrocodone; Levofloxacin; and Tramadol  Family History  Problem Relation Age of Onset  . Heart disease Mother   . Heart disease Father   . Cancer Sister     lung  . Breast cancer Daughter     Social History Social History  Substance Use Topics  . Smoking status: Former Smoker    Types: Cigarettes  . Smokeless tobacco: Never Used  . Alcohol use No     Review of Systems Constitutional: Fevers and fatigue  Eyes: No visual changes. ENT: No sore throat. Cardiovascular: Denies chest pain. Respiratory: cough with shortness of breath last 2 daysGastrointestinal: No abdominal pain.  No nausea, no vomiting.  No diarrhea.  No constipation. Genitourinary: Negative for dysuria. Musculoskeletal: Negative for back pain.patient reports she knows is a blod clot in her ft leg, she has not experienced any new syms such as numbness or cold foot, painful leg. Currently on Xarelto for this Skin: Negative for rash. Neurological: Negative for headaches, focal weakness or numbness.  10-point ROS otherwise negative.  ____________________________________________   PHYSICAL EXAM:  VITAL SIGNS: ED Triage Vitals  Enc Vitals Group     BP 08/24/15 1706 123/78     Pulse Rate 08/24/15 1706 88     Resp 08/24/15 1706 20     Temp 08/24/15 1706 99.7 F (37.6 C)     Temp Source 08/24/15 1706 Oral     SpO2 08/24/15 1703 (!) 75 %     Weight 08/24/15 1707 143 lb 6.4 oz (65 kg)     Height 08/24/15 1707 4\' 11"  (1.499 m)     Head Circumference --      Peak Flow --      Pain Score --      Pain Loc --      Pain Edu? --      Excl. in [place]? --     Constitutional: Alert and oriented. Well appearing and in no acute distress. Eyes: Conjunctivae are normal. PERRL. EOMI. Head: Atraumatic. Nose: No congestion/rhinnorhea. Mouth/Throat: Mucous membranes are moist.  Oropharynx non-erythematous. Neck: No stridor.   Cardiovascular: Normal rate, regular rhythm. Grossly normal heart sounds.  Good peripheral circulation. Respiratoryminimal tachypnea, mildly excess. muscles, but no distress. Normal oxygen saturation on 2 L nasal cannula stinal: Soft and nontender. No distention. No abdominal bruits. No CVA tenderness. Musculoskeletal: No lower extremity tenderness nor edema.  No joint effusions. both lower extremities are slightly cool to touch, the right has a palpable  dorsalis pedis, the left has slow approximately 3 second capillary refill and no dopplerable pulses. This was discussed with Dr. Delana Meyer, of vascular surgery and he reports that they're aware the patient continues to have a pulseless foot but Xarelto can be continued but due to the proximity of her kyphoplasty in time, she cannot have TPA procedure presently. The lack of  pulses in the left foot is not unusual for this patient for vascular surgery. They are aware and have been following this. The patient denies any pain numbness weakness or trouble using the left leg while at rest.  Neurologic:  Normal speech and language. No gross focal neurologic deficits are appreciated. Skin:  Skin is warm, dry and intact. No rash noted. Psychiatric: Mood and affect are normal. Speech and behavior are normal.  ____________________________________________   LABS (all labs ordered are listed, but only abnormal results are displayed)  Labs Reviewed  CBC - Abnormal; Notable for the following:       Result Value   WBC 20.2 (*)    Hemoglobin 11.2 (*)    HCT 34.5 (*)    RDW 19.2 (*)    All other components within normal limits  COMPREHENSIVE METABOLIC PANEL - Abnormal; Notable for the following:    Sodium 131 (*)    Chloride 98 (*)    Glucose, Bld 141 (*)    Creatinine, Ser 1.03 (*)    Albumin 3.4 (*)    GFR calc non Af Amer 49 (*)    GFR calc Af Amer 56 (*)    All other components within normal limits  PROTIME-INR - Abnormal; Notable for the following:    Prothrombin Time 25.4 (*)    All other components within normal limits  APTT - Abnormal; Notable for the following:    aPTT 51 (*)    All other components within normal limits  URINALYSIS COMPLETEWITH MICROSCOPIC ([facility] ONLY) - Abnormal; Notable for the following:    Color, Urine YELLOW (*)    APPearance HAZY (*)    Squamous Epithelial / LPF 0-5 (*)    All other components within normal limits  CULTURE, BLOOD (ROUTINE X 2)  CULTURE, BLOOD (ROUTINE  X 2)  MRSA PCR SCREENING  TROPONIN I  LIPASE, BLOOD   ____________________________________________  EKG  Reviewed and interpreted by me at 1705 Heart rate 90 QRS 90 QTc 4:30 Normal sinus rhythm, no evidence of acute ischemic change. ____________________________________________  RADIOLOGY  Dg Lumbar Spine 2-3 Views  Result Date: 08/24/2015 CLINICAL DATA:  Fever and shortness of breath. Back pain and hip pain. EXAM: LUMBAR SPINE - 2-3 VIEW COMPARISON:  08/19/2015.  08/07/2015. FINDINGS: Curvature convex to the right as seen previously. Previous augmentation of an L4 fracture. Chronic degenerative changes elsewhere throughout the lumbar spine. No unfavorable spread of methylmethacrylate. IMPRESSION: Vertebral augmentation L4 without visible complication. Curvature convex to the right with extensive lumbar degenerative changes as seen previously. Electronically Signed   By: Nelson Chimes M.D.   On: 08/24/2015 20:42   Dg Chest Portable 1 View  Result Date: 08/24/2015 CLINICAL DATA:  Fever, shortness of breath, back surgery 5 days ago, known blood clot in LEFT leg, history asthma, hypertension EXAM: PORTABLE CHEST 1 VIEW COMPARISON:  Portable exam 1718 hours compared to 08/04/2015 FINDINGS: Rotated to the RIGHT. Enlargement of cardiac silhouette. Mediastinal contours and pulmonary vascularity normal. RIGHT lung infiltrates question pneumonia. Minimal atelectasis at lung bases. No pleural effusion or pneumothorax. Bones demineralized. Deformity due to remote LEFT clavicular fracture. IMPRESSION: Enlargement of cardiac silhouette. Bibasilar atelectasis with RIGHT perihilar infiltrate. Electronically Signed   By: Lavonia Dana M.D.   On: 08/24/2015 17:29    ____________________________________________   PROCEDURES  Procedure(s) performed: None  Procedures  Critical Care performed: Yes, see critical care note(s)  CRITICAL CARE Performed by: Delman Kitten   Total critical care time: 40  minutes  Patient with initial oxygen saturation less than 80% requiring emergent evaluation by myself.   Critical care time was exclusive of separately billable procedures and treating other patients.  Critical care was necessary to treat or prevent imminent or life-threatening deterioration.  Critical care was time spent personally by me on the following activities: development of treatment plan with patient and/or surrogate as well as nursing, discussions with consultants, evaluation of patient's response to treatment, examination of patient, obtaining history from patient or surrogate, ordering and performing treatments and interventions, ordering and review of laboratory studies, ordering and review of radiographic studies, pulse oximetry and re-evaluation of patient's condition.  ____________________________________________   INITIAL IMPRESSION / ASSESSMENT AND PLAN / ED COURSE  Pertinent labs & imaging results that were available during my care of the patient were reviewed by me and considered in my medical decision making (see chart for details).  The patient presents with fever cough and recent hospitalization for kyphoplasty. Her chest x-ray and clinical symptomatology appear consistent with pneumonia. We'll treat for healthcare associated. In addition the patient has a pulseless left foot, but this was discussed with Dr. Ronalee Belts and vascular surgery is aware of this and the patient is not reporting any new symptoms or concerns acutely involving that leg. Currently on an continuing Xarelto. Dr. Hinton Lovely advises to continue the patient on Xarelto, do not heparinize at this time. They will follow the patient clinically, but reports there is no available acute intervention for her at this time other than continuation of Xarelto, in addition the patient does not appear to be symptomatic of this condition.  I considered the possibility of other causes for acute dyspnea after surgery  including DVT however the patient has had recent Dopplers with no evidence of DVT, and she is anticoagulated. She does not report pleuritic pain, and her symptomatology and x-ray appear consistent with pneumonia.  Clinical Course     ____________________________________________   FINAL CLINICAL IMPRESSION(S) / ED DIAGNOSES  Final diagnoses:  HCAP (healthcare-associated pneumonia)  Occlusion of artery of leg ([place])  Hypoxemia      NEW MEDICATIONS STARTED DURING THIS VISIT:  New Prescriptions   No medications on file     Note:  This document was prepared using Dragon voice recognition software and may include unintentional dictation errors.     Delman Kitten, MD 08/24/15 2051

## 2015-08-24 NOTE — Progress Notes (Signed)
Pharmacy Antibiotic Note  Natasha [name] is a 82 y.o. female admitted on 08/24/2015 with sepsis.  Pharmacy has been consulted for vancomycin & piperacillin/tazobactam dosing.  Plan: Piperacillin/tazobactam 3.375 g IV q8h EI  Vancomycin 1000 mg followed by 750 mg IV q24h (no stacked dose) Goal vancomycin trough 15-20 mcg/mL Vancomycin trough ordered for 8/16 at 1930  Kinetics: Using adjusted body weight of 52 kg Ke: 0.032 Half-life: 22 hrs Vd: 36 L  Height: 4\' 11"  (149.9 cm) Weight: 143 lb 6.4 oz (65 kg) IBW/kg (Calculated) : 43.2  Temp (24hrs), Avg:99.2 F (37.3 C), Min:98.7 F (37.1 C), Max:99.7 F (37.6 C)   Recent Labs Lab 08/24/15 1744  WBC 20.2*  CREATININE 1.03*    Estimated Creatinine Clearance: 33.3 mL/min (by C-G formula based on SCr of 1.03 mg/dL).    Allergies  Allergen Reactions  . Codeine Diarrhea  . Hydrocodone Nausea And Vomiting  . Levofloxacin Other (See Comments)    Muscle cramps  . Tramadol Nausea And Vomiting    Antimicrobials this admission: vancomycin 8/13 >>  Piperacillin/tazobactam 8/13 >>   Dose adjustments this admission:  Microbiology results:   Thank you for allowing pharmacy to be a part of this patient's care.  Darylene Price [facility] 08/24/2015 9:44 PM

## 2015-08-24 NOTE — H&P (Signed)
[place] at [place] NAME: Natasha [name]    MR#:  [id number]  DATE OF BIRTH:  [birth year]  DATE OF ADMISSION:  08/24/2015  PRIMARY CARE PHYSICIAN: Rusty Aus, MD   REQUESTING/REFERRING PHYSICIAN: Dr. Delman Kitten  CHIEF COMPLAINT:   Chief Complaint  Patient presents with  . Shortness of Breath    HISTORY OF PRESENT ILLNESS:  Natasha [name]  is a 82 y.o. female with a known history of Severe arthritis, GERD, hypertension, asthma and 3 L home oxygen, peripheral arterial disease with left lower extremity bypass arterial grafts on Xarelto, cardiomegaly who had recent kyphoplasty done for pathologic fractures of lumbar vertebrae last week and presents to Hospital secondary to fevers and weakness. Patient was having low back pain and was seen by PCP and lumbar x-rays ordered 2 weeks ago showed fracture and she had kyphoplasty done on 08/19/2015. 2 days later she had a ER visit for claudication pain in her left lower extremity and has seen the vascular surgeon for this same and was supposed to have angiogram done as an outpatient after her lower back has healed. Her claudication pain has remained stable. She was having fevers and chills associated with weakness and had a fall last night and so presents to the hospital. Complains of dry cough for the last couple of days. Also has shortness of breath especially on laying flat. She was hypoxic when EMS brought her to the emergency room. Normally she uses her oxygen only on lying down, sleeping and on severe exertion. Chest x-ray shows right perihilar infiltrate. So patient is being admitted for sepsis secondary to pneumonia.   PAST MEDICAL HISTORY:   Past Medical History:  Diagnosis Date  . Anemia   . Arthritis   . Asthma   . Bilateral pneumonia may 2017  . Bronchitis   . Cardiomegaly   . Cochlear implant in place    bilateral, [name], 04/26/2007   . Gallstones 2015  . GERD  (gastroesophageal reflux disease)   . H/O blood clots 2014  . History of home oxygen therapy    at night  . Hypertension   . Peripheral vascular disease ([place])    with arterial clots- on xarelto  . Restless leg syndrome     PAST SURGICAL HISTORY:   Past Surgical History:  Procedure Laterality Date  . ABDOMINAL HYSTERECTOMY  1960  . APPENDECTOMY  1946  . BREAST BIOPSY Bilateral    negative  . CHOLECYSTECTOMY  04-24-13  . COCHLEAR IMPLANT  2009  . COLONOSCOPY  2015   Dr. Rayann Heman   . EYE SURGERY  2008,2009   cataract  . KYPHOPLASTY N/A 08/19/2015   Procedure: KYPHOPLASTY;  Surgeon: Hessie Knows, MD;  Location: [facility];  Service: Orthopedics;  Laterality: N/A;  . PERIPHERAL VASCULAR CATHETERIZATION N/A 02/27/2015   Procedure: Abdominal Aortogram w/Lower Extremity;  Surgeon: Algernon Huxley, MD;  Location: [facility];  Service: Cardiovascular;  Laterality: N/A;  . PERIPHERAL VASCULAR CATHETERIZATION  02/27/2015   Procedure: Lower Extremity Intervention;  Surgeon: Algernon Huxley, MD;  Location: [facility] [place] [facility] [facility];  Service: Cardiovascular;;  . PERIPHERAL VASCULAR CATHETERIZATION Left 02/28/2015   Procedure: Lower Extremity Angiography;  Surgeon: Algernon Huxley, MD;  Location: [facility];  Service: Cardiovascular;  Laterality: Left;  . PERIPHERAL VASCULAR CATHETERIZATION  02/28/2015   Procedure: Lower Extremity Intervention;  Surgeon: Algernon Huxley, MD;  Location: [place] [facility];  Service:  Cardiovascular;;  . PERIPHERAL VASCULAR CATHETERIZATION Left 05/22/2015   Procedure: Lower Extremity Angiography;  Surgeon: Algernon Huxley, MD;  Location: [place] [facility];  Service: Cardiovascular;  Laterality: Left;  . PERIPHERAL VASCULAR CATHETERIZATION  05/22/2015   Procedure: Lower Extremity Intervention;  Surgeon: Algernon Huxley, MD;  Location: [facility];  Service: Cardiovascular;;  . PERIPHERAL VASCULAR CATHETERIZATION Left 05/23/2015   Procedure: Lower Extremity Angiography;   Surgeon: Algernon Huxley, MD;  Location: [place] [facility];  Service: Cardiovascular;  Laterality: Left;  . PERIPHERAL VASCULAR CATHETERIZATION  05/23/2015   Procedure: Lower Extremity Intervention;  Surgeon: Algernon Huxley, MD;  Location: [facility];  Service: Cardiovascular;;  . stent placement  2006-2014   multiple stent placements in legs    SOCIAL HISTORY:   Social History  Substance Use Topics  . Smoking status: Former Smoker    Types: Cigarettes  . Smokeless tobacco: Never Used  . Alcohol use No    FAMILY HISTORY:   Family History  Problem Relation Age of Onset  . Heart disease Mother   . Heart disease Father   . Cancer Sister     lung  . Breast cancer Daughter     DRUG ALLERGIES:   Allergies  Allergen Reactions  . Codeine Diarrhea  . Hydrocodone Nausea And Vomiting  . Levofloxacin Other (See Comments)    Muscle cramps  . Tramadol Nausea And Vomiting    REVIEW OF SYSTEMS:   Review of Systems  Constitutional: Positive for chills, fever and malaise/fatigue. Negative for weight loss.  HENT: Positive for hearing loss. Negative for ear discharge, ear pain, nosebleeds and tinnitus.   Eyes: Negative for blurred vision, double vision and photophobia.  Respiratory: Positive for cough and shortness of breath. Negative for hemoptysis and wheezing.   Cardiovascular: Positive for orthopnea and claudication. Negative for chest pain, palpitations and leg swelling.  Gastrointestinal: Positive for nausea. Negative for abdominal pain, constipation, diarrhea, heartburn, melena and vomiting.  Genitourinary: Negative for dysuria, frequency and urgency.  Musculoskeletal: Positive for joint pain and myalgias. Negative for back pain and neck pain.  Skin: Negative for rash.  Neurological: Positive for dizziness. Negative for tingling, tremors, sensory change, speech change, focal weakness and headaches.  Endo/Heme/Allergies: Does not bruise/bleed easily.    Psychiatric/Behavioral: Negative for depression.    MEDICATIONS AT HOME:   Prior to Admission medications   Medication Sig Start Date End Date Taking? Authorizing Provider  albuterol (PROVENTIL HFA;VENTOLIN HFA) 108 (90 BASE) MCG/ACT inhaler Inhale 2 puffs into the lungs 2 (two) times daily as needed for wheezing or shortness of breath.    Yes Historical Provider, MD  aspirin EC 325 MG tablet Take 325 mg by mouth at bedtime.    Yes Historical Provider, MD  budesonide (PULMICORT) 180 MCG/ACT inhaler Inhale 2 puffs into the lungs 2 (two) times daily.   Yes Historical Provider, MD  Calcium Carbonate-Vit D-Min (CALCIUM 1200) 1200-1000 MG-UNIT CHEW Chew 1 tablet by mouth daily.   Yes Historical Provider, MD  cholecalciferol (VITAMIN D) 1000 units tablet Take 1,000 Units by mouth every morning.    Yes Historical Provider, MD  diltiazem (CARDIZEM CD) 180 MG 24 hr capsule Take 180 mg by mouth every morning.    Yes Historical Provider, MD  docusate sodium (COLACE) 100 MG capsule Take 400 mg by mouth at bedtime.   Yes Historical Provider, MD  Esomeprazole Magnesium (NEXIUM PO) Take 22.3 mg by mouth 2 (two) times daily.  Yes Historical Provider, MD  ferrous sulfate 325 (65 FE) MG tablet Take 1 tablet (325 mg total) by mouth daily. 05/24/15  Yes Kimberly A Stegmayer, PA-C  furosemide (LASIX) 20 MG tablet Take 20 mg by mouth 2 (two) times daily.   Yes Historical Provider, MD  gabapentin (NEURONTIN) 300 MG capsule Take 300 mg by mouth 2 (two) times daily.   Yes Historical Provider, MD  glucosamine-chondroitin 500-400 MG tablet Take 1 tablet by mouth daily.   Yes Historical Provider, MD  MAGNESIUM PO Take 1 tablet by mouth at bedtime.    Yes Historical Provider, MD  Melatonin 5 MG TABS Take 5 mg by mouth at bedtime.    Yes Historical Provider, MD  montelukast (SINGULAIR) 10 MG tablet Take 10 mg by mouth every morning.    Yes Historical Provider, MD  oxyCODONE (ROXICODONE) 5 MG immediate release tablet Take  1 tablet (5 mg total) by mouth every 4 (four) hours as needed for severe pain. 08/19/15  Yes Hessie Knows, MD  oxyCODONE-acetaminophen (PERCOCET/ROXICET) 5-325 MG tablet Take 1 tablet by mouth daily as needed for severe pain.    Yes Historical Provider, MD  polyethylene glycol (MIRALAX / GLYCOLAX) packet Take 17 g by mouth daily as needed. Patient taking differently: Take 17 g by mouth 2 (two) times daily. Mix in 8 oz liquid and drink 06/11/15  Yes Loletha Grayer, MD  potassium chloride SA (K-DUR,KLOR-CON) 20 MEQ tablet Take 20-40 mEq by mouth See admin instructions. Take 2 tablets (40 mEq) by mouth every morning, Take 20 mEq by mouth every afternoon, and Take 2 tablets (40 mEq) by mouth every night at bedtime.   Yes Historical Provider, MD  pramipexole (MIRAPEX) 0.5 MG tablet Take 0.5 mg by mouth 2 (two) times daily. 06/30/15  Yes Historical Provider, MD  rivaroxaban (XARELTO) 20 MG TABS tablet Take 20 mg by mouth every morning.    Yes Historical Provider, MD  simvastatin (ZOCOR) 20 MG tablet Take 20 mg by mouth at bedtime. 07/03/15  Yes Historical Provider, MD  albuterol (PROVENTIL) (2.5 MG/3ML) 0.083% nebulizer solution Take 3 mLs (2.5 mg total) by nebulization every 6 (six) hours as needed for wheezing or shortness of breath. Patient not taking: Reported on 08/24/2015 06/11/15   Loletha Grayer, MD  azithromycin (ZITHROMAX) 250 MG tablet Take 1 tablet (250 mg total) by mouth daily. Take first 2 tablets together, then 1 every day until finished. Patient not taking: Reported on 08/15/2015 08/04/15   Blanchie Dessert, MD      VITAL SIGNS:  Blood pressure (!) 109/58, pulse 71, temperature 99.7 F (37.6 C), temperature source Oral, resp. rate (!) 26, height 4\' 11"  (1.499 m), weight 65 kg (143 lb 6.4 oz), SpO2 94 %.  PHYSICAL EXAMINATION:   Physical Exam  GENERAL:  82 y.o.-year-old patient lying in the bed with no acute distress.  EYES: Pupils equal, round, reactive to light and accommodation. No  scleral icterus. Extraocular muscles intact.  HEENT: Head atraumatic, normocephalic. Oropharynx and nasopharynx clear.  NECK:  Supple, no jugular venous distention. No thyroid enlargement, no tenderness.  LUNGS: Normal breath sounds bilaterally, no wheezing, rales,rhonchi or crepitation. No use of accessory muscles of respiration. Decreased bibasilar breath sounds. CARDIOVASCULAR: S1, S2 normal. No murmurs, rubs, or gallops.  ABDOMEN: Soft, nontender, nondistended. Bowel sounds present. No organomegaly or mass.  EXTREMITIES: No pedal edema, cyanosis, or clubbing. Spider veins noted on both lower extremities, dopplerable pulses in both lower extremities. They feel warm to touch. NEUROLOGIC: Cranial  nerves II through XII are intact. Muscle strength 5/5 in all extremities. Sensation intact. Gait not checked. Global weakness noted PSYCHIATRIC: The patient is alert and oriented x 3.  SKIN: No obvious rash, lesion, or ulcer. Echhymoses on her lower abdomen and also right flank noted.  LABORATORY PANEL:   CBC  Recent Labs Lab 08/24/15 1744  WBC 20.2*  HGB 11.2*  HCT 34.5*  PLT 292   ------------------------------------------------------------------------------------------------------------------  Chemistries   Recent Labs Lab 08/24/15 1744  NA 131*  K 4.7  CL 98*  CO2 25  GLUCOSE 141*  BUN 19  CREATININE 1.03*  CALCIUM 9.2  AST 35  ALT 18  ALKPHOS 102  BILITOT 0.4   ------------------------------------------------------------------------------------------------------------------  Cardiac Enzymes  Recent Labs Lab 08/24/15 1742  TROPONINI <0.03   ------------------------------------------------------------------------------------------------------------------  RADIOLOGY:  Dg Chest Portable 1 View  Result Date: 08/24/2015 CLINICAL DATA:  Fever, shortness of breath, back surgery 5 days ago, known blood clot in LEFT leg, history asthma, hypertension EXAM: PORTABLE CHEST 1  VIEW COMPARISON:  Portable exam 1718 hours compared to 08/04/2015 FINDINGS: Rotated to the RIGHT. Enlargement of cardiac silhouette. Mediastinal contours and pulmonary vascularity normal. RIGHT lung infiltrates question pneumonia. Minimal atelectasis at lung bases. No pleural effusion or pneumothorax. Bones demineralized. Deformity due to remote LEFT clavicular fracture. IMPRESSION: Enlargement of cardiac silhouette. Bibasilar atelectasis with RIGHT perihilar infiltrate. Electronically Signed   By: Lavonia Dana M.D.   On: 08/24/2015 17:29    EKG:   Orders placed or performed during the hospital encounter of 08/24/15  . EKG 12-Lead  . EKG 12-Lead  . ED EKG  . ED EKG    IMPRESSION AND PLAN:   Marylinda Bortnick  is a 82 y.o. female with a known history of Severe arthritis, GERD, hypertension, asthma and 3 L home oxygen, peripheral arterial disease with left lower extremity bypass arterial grafts on Xarelto, cardiomegaly who had recent kyphoplasty done for pathologic fractures of lumbar vertebrae last week and presents to Hospital secondary to fevers and weakness.  #1 sepsis-secondary to pneumonia. Likely healthcare acquired. -Blood cultures ordered. Check MRSA PCR. -Broad-spectrum antibiotics with vancomycin and Zosyn at this time. -Continue to monitor blood pressure and give fluids as needed. Hold Lasix at this time.  #2 chronic asthma/COPD on home oxygen-no active wheezing. Continue when necessary nebulizer and her home inhalers. -Continue oxygen support. -Check echocardiogram with her cardiomegaly and she takes Lasix at home for lower extremity edema.  #3 chronic lower extremity peripheral vascular disease-with left leg bypass arterial graft with arterial clots. - Continue Xarelto - outpatient f/u with vascular as needed  #4 Lumbar vertebral fracture with kyphoplasty last week and new fall- repeat lumbar X rays - physical therapy eval  #5 Hyperlipidemia- statin  #6 DVT Prophylaxis-  xarelto    All the records are reviewed and case discussed with ED provider. Management plans discussed with the patient, family and they are in agreement.  CODE STATUS: Full Code  TOTAL TIME TAKING CARE OF THIS PATIENT: 50 minutes.    Gladstone Lighter M.D on 08/24/2015 at 8:32 PM  Between 7am to 6pm - Pager - [phone]  After 6pm go to [email or web address] - password [email or web address] [facility]  Office  [phone]  CC: Primary care physician; Rusty Aus, MD

## 2015-08-24 NOTE — Progress Notes (Signed)
PHARMACIST - PHYSICIAN ORDER COMMUNICATION  CONCERNING: P&T Medication Policy on Herbal Medications  DESCRIPTION:  This patient's order for:  Glucosamine-chondroitin  has been noted.  This product(s) is classified as an "herbal" or natural product. Due to a lack of definitive safety studies or FDA approval, nonstandard manufacturing practices, plus the potential risk of unknown drug-drug interactions while on inpatient medications, the Pharmacy and Therapeutics Committee does not permit the use of "herbal" or natural products of this type within [place].   ACTION TAKEN: The pharmacy department is unable to verify this order at this time  Please reevaluate patient's clinical condition at discharge and address if the herbal or natural product(s) should be resumed at that time.    

## 2015-08-24 NOTE — ED Triage Notes (Signed)
Pt presents to ED c/o fever and SOB post back surgery Tuesday. Pt had O2 sat 75% RA ; applied 3 L [place] 90%. Pt has known blood clot in left leg but has to wait until clearance due to surgery.

## 2015-08-25 ENCOUNTER — Inpatient Hospital Stay
Admit: 2015-08-25 | Discharge: 2015-08-25 | Disposition: A | Discharge status: Home / Self Care | Payer: Medicare Other | Attending: Internal Medicine | Admitting: Internal Medicine

## 2015-08-25 LAB — BASIC METABOLIC PANEL
ANION GAP: 5 (ref 5–15)
BUN: 17 mg/dL (ref 6–20)
CALCIUM: 8.9 mg/dL (ref 8.9–10.3)
CO2: 27 mmol/L (ref 22–32)
Chloride: 103 mmol/L (ref 101–111)
Creatinine, Ser: 0.76 mg/dL (ref 0.44–1.00)
Glucose, Bld: 107 mg/dL — ABNORMAL HIGH (ref 65–99)
Potassium: 4.1 mmol/L (ref 3.5–5.1)
SODIUM: 135 mmol/L (ref 135–145)

## 2015-08-25 LAB — CBC
HEMATOCRIT: 29.9 % — AB (ref 35.0–47.0)
Hemoglobin: 9.9 g/dL — ABNORMAL LOW (ref 12.0–16.0)
MCH: 29.2 pg (ref 26.0–34.0)
MCHC: 33.3 g/dL (ref 32.0–36.0)
MCV: 87.8 fL (ref 80.0–100.0)
PLATELETS: 226 10*3/uL (ref 150–440)
RBC: 3.41 MIL/uL — ABNORMAL LOW (ref 3.80–5.20)
RDW: 18.6 % — AB (ref 11.5–14.5)
WBC: 14.5 10*3/uL — AB (ref 3.6–11.0)

## 2015-08-25 LAB — MRSA PCR SCREENING: MRSA BY PCR: NEGATIVE

## 2015-08-25 MED ORDER — POTASSIUM CHLORIDE CRYS ER 20 MEQ PO TBCR: 20.0000 meq | EXTENDED_RELEASE_TABLET | ORAL | Status: DC

## 2015-08-25 MED ORDER — POTASSIUM CHLORIDE CRYS ER 20 MEQ PO TBCR
20.0000 meq | EXTENDED_RELEASE_TABLET | Freq: Every day | ORAL | Status: DC
  Administered 2015-08-25 – 2015-08-26 (×2): 20 meq via ORAL
  Filled 2015-08-25 (×2): qty 1

## 2015-08-25 MED ORDER — DILTIAZEM HCL ER COATED BEADS 180 MG PO CP24
180.0000 mg | ORAL_CAPSULE | Freq: Every day | ORAL | Status: DC
  Administered 2015-08-25 – 2015-08-26 (×2): 180 mg via ORAL
  Filled 2015-08-25 (×2): qty 1

## 2015-08-25 MED ORDER — FUROSEMIDE 20 MG PO TABS
20.0000 mg | ORAL_TABLET | Freq: Two times a day (BID) | ORAL | Status: DC
  Administered 2015-08-25 – 2015-08-26 (×2): 20 mg via ORAL
  Filled 2015-08-25 (×2): qty 1

## 2015-08-25 NOTE — Progress Notes (Signed)
[place] at [place] NAME: Natasha [name]    MR#:  [id number]  DATE OF BIRTH:  [birth year]  SUBJECTIVE:  Very Hard on hearing. Came in after started having increasing shortness of breath and fever. She was found to have pneumonia. Coughing up minimal amount of dark colored phlegm  REVIEW OF SYSTEMS:   Review of Systems  Constitutional: Positive for fever. Negative for chills and weight loss.  HENT: Positive for hearing loss. Negative for ear discharge, ear pain and nosebleeds.   Eyes: Negative for blurred vision, pain and discharge.  Respiratory: Positive for cough, sputum production and shortness of breath. Negative for wheezing and stridor.   Cardiovascular: Negative for chest pain, palpitations, orthopnea and PND.  Gastrointestinal: Negative for abdominal pain, diarrhea, nausea and vomiting.  Genitourinary: Negative for frequency and urgency.  Musculoskeletal: Negative for back pain and joint pain.  Neurological: Positive for weakness. Negative for sensory change, speech change and focal weakness.  Psychiatric/Behavioral: Negative for depression and hallucinations. The patient is not nervous/anxious.    Tolerating Diet:yes Tolerating PT: pending  DRUG ALLERGIES:   Allergies  Allergen Reactions  . Codeine Diarrhea  . Hydrocodone Nausea And Vomiting  . Levofloxacin Other (See Comments)    Muscle cramps  . Tramadol Nausea And Vomiting    VITALS:  Blood pressure (!) 170/71, pulse 78, temperature 99.3 F (37.4 C), temperature source Oral, resp. rate (!) 24, height 4\' 11"  (1.499 m), weight 143 lb 6.4 oz (65 kg), SpO2 94 %.  PHYSICAL EXAMINATION:   Physical Exam  GENERAL:  82 y.o.-year-old patient lying in the bed with no acute distress.  EYES: Pupils equal, round, reactive to light and accommodation. No scleral icterus. Extraocular muscles intact.  HEENT: Head atraumatic, normocephalic. Oropharynx and nasopharynx clear.   NECK:  Supple, no jugular venous distention. No thyroid enlargement, no tenderness.  LUNGSdistant breath sounds bilaterally, no wheezing, rales, rhonchi. No use of accessory muscles of respiration.  CARDIOVASCULAR: S1, S2 normal. No murmurs, rubs, or gallops.  ABDOMEN: Soft, nontender, nondistended. Bowel sounds present. No organomegaly or mass.  EXTREMITIES: No cyanosis, clubbing or edema b/l.    NEUROLOGIC: Cranial nerves II through XII are intact. No focal Motor or sensory deficits b/l.   PSYCHIATRIC:  patient is alert and oriented x 3.  SKIN: No obvious rash, lesion, or ulcer.   LABORATORY PANEL:  CBC  Recent Labs Lab 08/25/15 0420  WBC 14.5*  HGB 9.9*  HCT 29.9*  PLT 226    Chemistries   Recent Labs Lab 08/24/15 1744 08/25/15 0420  NA 131* 135  K 4.7 4.1  CL 98* 103  CO2 25 27  GLUCOSE 141* 107*  BUN 19 17  CREATININE 1.03* 0.76  CALCIUM 9.2 8.9  AST 35  --   ALT 18  --   ALKPHOS 102  --   BILITOT 0.4  --    Cardiac Enzymes  Recent Labs Lab 08/24/15 1742  TROPONINI <0.03   RADIOLOGY:  Dg Lumbar Spine 2-3 Views  Result Date: 08/24/2015 CLINICAL DATA:  Fever and shortness of breath. Back pain and hip pain. EXAM: LUMBAR SPINE - 2-3 VIEW COMPARISON:  08/19/2015.  08/07/2015. FINDINGS: Curvature convex to the right as seen previously. Previous augmentation of an L4 fracture. Chronic degenerative changes elsewhere throughout the lumbar spine. No unfavorable spread of methylmethacrylate. IMPRESSION: Vertebral augmentation L4 without visible complication. Curvature convex to the right with extensive lumbar degenerative changes as seen previously. Electronically Signed  By: Nelson Chimes M.D.   On: 08/24/2015 20:42   Dg Chest Portable 1 View  Result Date: 08/24/2015 CLINICAL DATA:  Fever, shortness of breath, back surgery 5 days ago, known blood clot in LEFT leg, history asthma, hypertension EXAM: PORTABLE CHEST 1 VIEW COMPARISON:  Portable exam 1718 hours compared  to 08/04/2015 FINDINGS: Rotated to the RIGHT. Enlargement of cardiac silhouette. Mediastinal contours and pulmonary vascularity normal. RIGHT lung infiltrates question pneumonia. Minimal atelectasis at lung bases. No pleural effusion or pneumothorax. Bones demineralized. Deformity due to remote LEFT clavicular fracture. IMPRESSION: Enlargement of cardiac silhouette. Bibasilar atelectasis with RIGHT perihilar infiltrate. Electronically Signed   By: Lavonia Dana M.D.   On: 08/24/2015 17:29   ASSESSMENT AND PLAN:  Jacob Wolak  is a 82 y.o. female with a known history of Severe arthritis, GERD, hypertension, asthma and 3 L home oxygen, peripheral arterial disease with left lower extremity bypass arterial grafts on Xarelto, cardiomegaly who had recent kyphoplasty done for pathologic fractures of lumbar vertebrae last week and presents to Hospital secondary to fevers and weakness.  #1 sepsis-secondary to pneumonia. Likely healthcare acquired. -Blood cultures neg so far - negative MRSA PCR. -Broad-spectrum antibiotics with  Zosyn at this time. -d/c vanc -wbc trending down  #2 chronic asthma/COPD on home oxygen-no active wheezing. Continue when necessary nebulizer and her home inhalers. -Continue oxygen support. -Check echocardiogram with her cardiomegaly and she takes Lasix at home for lower extremity edema.  #3 chronic lower extremity peripheral vascular disease-with left leg bypass arterial graft with arterial clots. - Continue Xarelto - outpatient f/u with vascular as needed  #4 Lumbar vertebral fracture with kyphoplasty last week and new fall- repeat lumbar X rays - physical therapy eval  #5 Hyperlipidemia- statin  #6 DVT Prophylaxis- xarelto  Case discussed with Care Management/Social Worker. Management plans discussed with the patient, family and they are in agreement.  CODE STATUS: full  DVT Prophylaxis: xarelto  TOTAL TIME TAKING CARE OF THIS PATIENT: 30 minutes.  >50%  time spent on counselling and coordination of care  POSSIBLE D/C IN 1-2DAYS, DEPENDING ON CLINICAL CONDITION.  Note: This dictation was prepared with Dragon dictation along with smaller phrase technology. Any transcriptional errors that result from this process are unintentional.  Matison Nuccio M.D on 08/25/2015 at 12:50 PM  Between 7am to 6pm - Pager - [phone]  After 6pm go to [email or web address] - password [email or web address] [facility]  Office  [phone]  CC: Primary care physician; Rusty Aus, MD

## 2015-08-25 NOTE — Progress Notes (Signed)
*  PRELIMINARY RESULTS* Echocardiogram 2D Echocardiogram has been performed.  Sherrie Sport 08/25/2015, 11:03 AM

## 2015-08-25 NOTE — Evaluation (Signed)
Physical Therapy Evaluation Patient Details Name: Natasha [name] MRN: [id number] DOB: [birth year] Today's Date: 08/25/2015/ History of Present Illness  Pt is an 82 yr old female presenting s/p a fall  8/13 and with c/o fever, cough, SOB, and generalized weakness. Pt admitted with sepsis 2/2 pneumonia. PMH significant for anemia, asthma, arthritis, PAD, and h/o LE clotting s/p multiple stents/grafts LLE. Of note, pt is s/p kyphoplasty 8/8 as result of L4 compression fx.  She had a ER visit 8/10 for claudication pain in her LLE and was d/c'ed home awaiting outpatient angiogram once her lower back healed. Her claudication pain has remained constant and unchanged.    Clinical Impression  Prior to admission, pt was mod I with RW for household mobility and ADLs, using 3L supplemental O2 only when sleeping or laying flat.  Pt lives with her spouse in a level-entry, one story home at an independent living facility. Currently, pt is mod I with bed mobility and CGA for sit <> stand transfers and short bouts of ambulation with RW.  Bilat UE/LE strength and ROM grossly WFL and symmetrical; moderate pain in L LE with any WB'ing (5/10). SaO2 dropped from 92% to 86% on 2L O2 with exertion (RN notified) and though she was safe, she was significantly fatigued by the transfer. Pt would benefit from skilled PT to address noted impairments and functional limitations.  Recommend pt discharge home with HHPT when medically appropriate.     Follow Up Recommendations Home health PT     Equipment Recommendations  None recommended by PT (pt has RW and home O2)     Recommendations for Other Services       Precautions / Restrictions Precautions Precautions: Fall;Back Precaution Comments: Pt is s/p kyphoplasty 8/8 Restrictions Weight Bearing Restrictions: No      Mobility  Bed Mobility Overal bed mobility: Modified Independent General bed mobility comments: With [facility] elevated ~45 deg, pt achieves supine > sit  mod I. No vc's required.  Transfers Overall transfer level: Needs assistance Equipment used: Rolling walker (2 wheeled) Transfers: Sit to/from Stand Sit to Stand: Min guard General transfer comment: Min guard for safety, no physical assist required. Pt relies on moderate UE support to achieve standing.  Ambulation/Gait Ambulation/Gait assistance: Min guard Ambulation Distance (Feet):  (3 x 69ft) Assistive device: Rolling walker (2 wheeled) Gait Pattern/deviations: Step-to pattern;Decreased step length - right;Decreased step length - left;Narrow base of support Gait velocity: Decreased General Gait Details: Pt able to perform ambulatory transfers bed > chair, chair > BSC, and BSC > chair with RW and min guard for safety. Slow and steady.   Stairs      Balance Overall balance assessment: Needs assistance Sitting-balance support: Bilateral upper extremity supported;Feet supported Sitting balance-Leahy Scale: Good Standing balance support: Bilateral upper extremity supported (on RW) Standing balance-Leahy Scale: Fair      Pertinent Vitals/Pain Pain Assessment: 0-10 Pain Score: 5  (2/10 at rest, 5/10 with WB'ing) Pain Location: LLE Pain Descriptors / Indicators: Constant Pain Intervention(s): Limited activity within patient's tolerance;Monitored during session;Repositioned   HR monitored throughout session and maintained WFL.  SaO2 on 2L O2: Rest: 92% Ambulatory transfer to chair (26ft): 86%, recovered with ~1 minute seated rest to 94% End of session: 94% RN notified    Home Living Family/patient expects to be discharged to:: Private residence Living Arrangements: Spouse/significant other Available Help at Discharge: Family Type of Home: Independent living facility (([facility])) Home Access: Level entry Home Layout: One level Home Equipment: Environmental consultant -  2 wheels   Prior Function Level of Independence: Independent with assistive device(s)  Comments: Pt able to ambulate and  perform all ADLs mod I with RW. Uses 3L O2 while sleeping or laying flat.        Extremity/Trunk Assessment   Upper Extremity Assessment: Generalized weakness  Lower Extremity Assessment: Generalized weakness  Cervical / Trunk Assessment: Kyphotic    Communication   Communication: HOH (Pt with bilat cochlear implants)  Cognition Arousal/Alertness: Awake/alert Behavior During Therapy: WFL for tasks assessed/performed Overall Cognitive Status: Within Functional Limits for tasks assessed    General Comments Nursing cleared pt for participation in physical therapy.  Pt agreeable to PT session. Pt without battery for cochlear implants and thus unable to hear PT this date. Communicated with pt via pen and paper and demonstration.    Exercises General Exercises - Lower Extremity Ankle Circles/Pumps: AROM;Left Short Arc Quad: AROM;Both Heel Slides: AROM;Both Hip ABduction/ADduction: AROM;Both Straight Leg Raises: AROM;Both All exercises performed bilaterally x 10 reps in supine.      Assessment/Plan    PT Assessment Patient needs continued PT services  PT Diagnosis Generalized weakness   PT Problem List Decreased strength;Decreased activity tolerance;Decreased balance;Cardiopulmonary status limiting activity  PT Treatment Interventions Gait training;Functional mobility training;Therapeutic activities;Therapeutic exercise;Balance training;Patient/family education   PT Goals (Current goals can be found in the Care Plan section) Acute Rehab PT Goals Patient Stated Goal: To go home PT Goal Formulation: With patient Time For Goal Achievement: 09/08/15 Potential to Achieve Goals: Good    Frequency Min 2X/week   Barriers to discharge           End of Session Equipment Utilized During Treatment: Gait belt;Oxygen (2L O2) Activity Tolerance: Patient tolerated treatment well;Patient limited by pain;Patient limited by fatigue Patient left: in chair;with call bell/phone within  reach;with chair alarm set Nurse Communication: Mobility status;Precautions (O2 desaturation)         Time: [id number] PT Time Calculation (min) (ACUTE ONLY): 43 min   Charges:         PT G Codes:        Kentrail Shew, SPT 08/25/2015, 12:46 PM

## 2015-08-25 NOTE — Care Management Important Message (Signed)
Important Message  Patient Details  Name: Natasha [name] MRN: [id number] Date of Birth: [birth year]   Medicare Important Message Given:  Yes    Alvie Heidelberg, RN 08/25/2015, 9:52 AM

## 2015-08-25 NOTE — Care Management Note (Signed)
Case Management Note  Patient Details  Name: Natasha [name] MRN: [id number] Date of Birth: 1928-11-10  Subjective/Objective:   Spoke with the patient for discharge planning. Patient is very H O H and does not have her hearing aids.  Used Pen and paper to communicate. Patient answered my questions appropriately. She is from [place] with her Husband. She was offered choice of home health providers and chose to go with [facility]. She has a Corporate investment banker and a front rolling walker. No other CM needs identified.                 Action/Plan: Anticipated discharge to [facility] assisted living with [facility] Referral placed with [place]  Expected Discharge Date:                  Expected Discharge Plan:  [place]  In-House Referral:     Discharge planning Services  CM Consult  Post Acute Care Choice:    Choice offered to:  Patient  DME Arranged:  N/A DME Agency:     HH Arranged:  PT HH Agency:  [facility] [place]  Status of Service:  In process, will continue to follow  If discussed at Long Length of Stay Meetings, dates discussed:    Additional Comments:  Alvie Heidelberg, RN 08/25/2015, 2:10 PM

## 2015-08-25 NOTE — Progress Notes (Signed)
Pt called for nursing, coughing up sputum containing blood clots (marble to pea sized). MD notified. Spoke with Dr. Posey Pronto, do not d/c xarelto. No new orders at this time. Will continue to monitor.

## 2015-08-26 MED ORDER — AMOXICILLIN-POT CLAVULANATE 875-125 MG PO TABS
1.0000 | ORAL_TABLET | Freq: Two times a day (BID) | ORAL | Status: DC
  Administered 2015-08-26: 1 via ORAL
  Filled 2015-08-26: qty 1

## 2015-08-26 MED ORDER — DIPHENOXYLATE-ATROPINE 2.5-0.025 MG PO TABS
2.0000 | ORAL_TABLET | Freq: Once | ORAL | Status: AC
  Administered 2015-08-26: 2 via ORAL
  Filled 2015-08-26: qty 2

## 2015-08-26 MED ORDER — AMOXICILLIN-POT CLAVULANATE 875-125 MG PO TABS: 1.0000 | ORAL_TABLET | Freq: Two times a day (BID) | ORAL | 0 refills | Status: DC

## 2015-08-26 NOTE — Progress Notes (Signed)
Patient given discharge orders. Verified Rx sent to [facility]. All questions answered. Information verbalized back to me. Patient discharged via w/c with volunteer services. Husband driving. No further needs.

## 2015-08-26 NOTE — Progress Notes (Signed)
Pt with loose stools . No odor. Ate raisin bran this am followed by dose of augmentin. Dr patel notified. Lomotil 2 po oncwe ordered

## 2015-08-26 NOTE — Progress Notes (Signed)
Physical Therapy Treatment Patient Details Name: Natasha [name] MRN: [id number] DOB: 12-15-1928 Today's Date: 08/26/2015    History of Present Illness Pt is an 82 yr old female presenting s/p a fall  8/13 and with c/o fever, cough, SOB, and generalized weakness. Pt admitted with sepsis 2/2 pneumonia. PMH significant for anemia, asthma, arthritis, PAD, and h/o LE clotting s/p multiple stents/grafts LLE. Of note, pt is s/p kyphoplasty 8/8 as result of L4 compression fx.  She had a ER visit 8/10 for claudication pain in her LLE and was d/c'ed home awaiting outpatient angiogram once her lower back healed. Her claudication pain has remained constant and unchanged.     PT Comments    Pt demonstrated great progress with PT this date, able to ambulate 147ft on unit with RW, 2L supplemental O2, and close supervision. Slight decrease in SaO2 following ambulation, from 96% to 93%, but quickly improved to 97% with seated rest (all on 2L). Pt continues with c/o LLE pain with any WB'ing, up to 4/10 during our session. Will continue to progress with functional LE strengthening, balance activity, and gait training as able.      Follow Up Recommendations  Home health PT     Equipment Recommendations  None recommended by PT (pt has RW and home O2)    Recommendations for Other Services       Precautions / Restrictions Precautions Precautions: Fall;Back Precaution Comments: Pt is s/p kyphoplasty 8/8 Restrictions Weight Bearing Restrictions: No    Mobility  Bed Mobility General bed mobility comments: Received in chair, returned to chair; not assessed  Transfers Overall transfer level: Modified independent Equipment used: Rolling walker (2 wheeled) Transfers: Sit to/from Stand General transfer comment: Pt able to complete sit <> stand transfers safely with appropriate use of RW.   Ambulation/Gait Ambulation/Gait assistance: Supervision Ambulation Distance (Feet): 100 Feet Assistive device:  Rolling walker (2 wheeled) Gait Pattern/deviations: WFL(Within Functional Limits);Step-through pattern;Trunk flexed Gait velocity: Decreased   General Gait Details: Safe and appropriate use of RW with ambulation. No significant gait deviation or LOB. SaO2 maintained >93% on 2L supplemental O2.   Stairs      Balance Overall balance assessment: Needs assistance Sitting-balance support: Feet supported Sitting balance-Leahy Scale: Good Standing balance support: Single extremity supported Standing balance-Leahy Scale: Good Standing balance comment: Pt able to perform LE dressing tasts with only unilateral UE support. Close standby assist for safety; no LOB occured.    Cognition Arousal/Alertness: Awake/alert Behavior During Therapy: WFL for tasks assessed/performed Overall Cognitive Status: Within Functional Limits for tasks assessed   Exercises General Exercises - Lower Extremity Ankle Circles/Pumps: AROM;Both Quad Sets: AROM;Both Short Arc Quad: AROM;Both Heel Slides: AROM;Both Hip ABduction/ADduction: AROM;Both Straight Leg Raises: AROM;Both  All exercises performed bilaterally x 10 reps in long sitting.     General Comments General comments (skin integrity, edema, etc.): Pt with hearing aids this AM, improved communication with PT.   Pt up in chair upon room entry, agreeable to PT session. Per chart review, pt was having episodes of dark colored sputum, but per conversation with pt, the last episode was 13:00 yesterday (8/14).      Pertinent Vitals/Pain Pain Assessment: 0-10 Pain Score: 4  Pain Location: LLE Pain Descriptors / Indicators: Constant Pain Intervention(s): Limited activity within patient's tolerance;Monitored during session;Repositioned  HR and O2 monitored throughout session and maintained WFL.            PT Goals (current goals can now be found in the care plan section)  Acute Rehab PT Goals Patient Stated Goal: To go home PT Goal Formulation: With  patient Time For Goal Achievement: 09/08/15 Potential to Achieve Goals: Good Progress towards PT goals: Progressing toward goals    Frequency  Min 2X/week    PT Plan Current plan remains appropriate       End of Session Equipment Utilized During Treatment: Gait belt;Oxygen (2L O2) Activity Tolerance: Patient tolerated treatment well Patient left: in chair;with call bell/phone within reach;with chair alarm set;with nursing/sitter in room  Nursing Communication: mobility status; precautions      Time: [id number] PT Time Calculation (min) (ACUTE ONLY): 24 min  Charges:                       G Codes:      Zakiyah Diop, SPT 08/26/2015, 10:28 AM

## 2015-08-26 NOTE — Discharge Summary (Signed)
[facility] [place] NAME: Natasha [name]    MR#:  [id number]  DATE OF BIRTH:  Oct 04, 1928  DATE OF ADMISSION:  08/24/2015 ADMITTING PHYSICIAN: Gladstone Lighter, MD  DATE OF DISCHARGE: 08/26/15  PRIMARY CARE PHYSICIAN: Rusty Aus, MD    ADMISSION DIAGNOSIS:  Hypoxemia [R09.02] Back pain [M54.9] Fall [W19.XXXA] Occlusion of artery of leg (HCC) [I74.3] HCAP (healthcare-associated pneumonia) [J18.9]  DISCHARGE DIAGNOSIS:  Right side pneumonia  SECONDARY DIAGNOSIS:   Past Medical History:  Diagnosis Date  . Anemia   . Arthritis   . Asthma   . Bilateral pneumonia may 2017  . Bronchitis   . Cardiomegaly   . Cochlear implant in place    bilateral, Natasha [name], 04/26/2007   . Gallstones 2015  . GERD (gastroesophageal reflux disease)   . H/O blood clots 2014  . History of home oxygen therapy    at night  . Hypertension   . Peripheral vascular disease ([place])    with arterial clots- on xarelto  . Restless leg syndrome     HOSPITAL COURSE:   Natasha [name] a 82 y.o. femalewith a known history of Severe arthritis, GERD, hypertension, asthma and 3 L home oxygen, peripheral arterial disease with left lower extremity bypass arterial grafts on Xarelto, cardiomegaly who had recent kyphoplasty done for pathologic fractures of lumbar vertebrae last week and presents to Hospital secondary to fevers and weakness.  #1 sepsis-secondary to pneumonia. -Blood cultures neg so far - negative MRSA PCR. -Broad-spectrum antibiotics with  Zosyn ----change to po augmenting -d/c vanc -wbc trending down -afebrile, HR stable, sats>92%  On 2 liter [place](chronic home oxygen)  #2 chronic asthma/COPD on home oxygen-no active wheezing. Continue when necessary nebulizer and her home inhalers. -Continue oxygen support. -echocardiogram with her cardiomegaly and she takes Lasix at home for lower extremity edema. Results pending  #3  chronic lower extremity peripheral vascular disease-with left leg bypass arterial graft with arterialclots. - Continue Xarelto - outpatient f/u with vascular as needed  #4 Lumbar vertebral fracture with kyphoplasty last week and new fall- repeat lumbar X rays - physical therapy eval recommends HHPT/RN  #5 Hyperlipidemia- statin  #6 DVT Prophylaxis- xarelto  Overall stable D/c back to [facility] CONSULTS OBTAINED:    DRUG ALLERGIES:   Allergies  Allergen Reactions  . Codeine Diarrhea  . Hydrocodone Nausea And Vomiting  . Levofloxacin Other (See Comments)    Muscle cramps  . Tramadol Nausea And Vomiting    DISCHARGE MEDICATIONS:   Current Discharge Medication List    START taking these medications   Details  amoxicillin-clavulanate (AUGMENTIN) 875-125 MG tablet Take 1 tablet by mouth every 12 (twelve) hours. Qty: 16 tablet, Refills: 0      CONTINUE these medications which have NOT CHANGED   Details  albuterol (PROVENTIL HFA;VENTOLIN HFA) 108 (90 BASE) MCG/ACT inhaler Inhale 2 puffs into the lungs 2 (two) times daily as needed for wheezing or shortness of breath.     aspirin EC 325 MG tablet Take 325 mg by mouth at bedtime.     budesonide (PULMICORT) 180 MCG/ACT inhaler Inhale 2 puffs into the lungs 2 (two) times daily.    Calcium Carbonate-Vit D-Min (CALCIUM 1200) 1200-1000 MG-UNIT CHEW Chew 1 tablet by mouth daily.    cholecalciferol (VITAMIN D) 1000 units tablet Take 1,000 Units by mouth every morning.     diltiazem (CARDIZEM CD) 180 MG 24 hr capsule Take 180 mg by mouth every morning.  docusate sodium (COLACE) 100 MG capsule Take 400 mg by mouth at bedtime.    Esomeprazole Magnesium (NEXIUM PO) Take 22.3 mg by mouth 2 (two) times daily.     ferrous sulfate 325 (65 FE) MG tablet Take 1 tablet (325 mg total) by mouth daily. Qty: 30 tablet, Refills: 3    furosemide (LASIX) 20 MG tablet Take 20 mg by mouth 2 (two) times daily.    gabapentin (NEURONTIN)  300 MG capsule Take 300 mg by mouth 2 (two) times daily.    glucosamine-chondroitin 500-400 MG tablet Take 1 tablet by mouth daily.    MAGNESIUM PO Take 1 tablet by mouth at bedtime.     Melatonin 5 MG TABS Take 5 mg by mouth at bedtime.     montelukast (SINGULAIR) 10 MG tablet Take 10 mg by mouth every morning.     oxyCODONE (ROXICODONE) 5 MG immediate release tablet Take 1 tablet (5 mg total) by mouth every 4 (four) hours as needed for severe pain. Qty: 30 tablet, Refills: 0    oxyCODONE-acetaminophen (PERCOCET/ROXICET) 5-325 MG tablet Take 1 tablet by mouth daily as needed for severe pain.     polyethylene glycol (MIRALAX / GLYCOLAX) packet Take 17 g by mouth daily as needed. Qty: 14 each, Refills: 0    potassium chloride SA (K-DUR,KLOR-CON) 20 MEQ tablet Take 20-40 mEq by mouth See admin instructions. Take 2 tablets (40 mEq) by mouth every morning, Take 20 mEq by mouth every afternoon, and Take 2 tablets (40 mEq) by mouth every night at bedtime.    pramipexole (MIRAPEX) 0.5 MG tablet Take 0.5 mg by mouth 2 (two) times daily.    rivaroxaban (XARELTO) 20 MG TABS tablet Take 20 mg by mouth every morning.     simvastatin (ZOCOR) 20 MG tablet Take 20 mg by mouth at bedtime.    albuterol (PROVENTIL) (2.5 MG/3ML) 0.083% nebulizer solution Take 3 mLs (2.5 mg total) by nebulization every 6 (six) hours as needed for wheezing or shortness of breath. Qty: 75 mL, Refills: 0      STOP taking these medications     temazepam (RESTORIL) 15 MG capsule         If you experience worsening of your admission symptoms, develop shortness of breath, life threatening emergency, suicidal or homicidal thoughts you must seek medical attention immediately by calling 911 or calling your MD immediately  if symptoms less severe.  You Must read complete instructions/literature along with all the possible adverse reactions/side effects for all the Medicines you take and that have been prescribed to you. Take  any new Medicines after you have completely understood and accept all the possible adverse reactions/side effects.   Please note  You were cared for by a hospitalist during your hospital stay. If you have any questions about your discharge medications or the care you received while you were in the hospital after you are discharged, you can call the unit and asked to speak with the hospitalist on call if the hospitalist that took care of you is not available. Once you are discharged, your primary care physician will handle any further medical issues. Please note that NO REFILLS for any discharge medications will be authorized once you are discharged, as it is imperative that you return to your primary care physician (or establish a relationship with a primary care physician if you do not have one) for your aftercare needs so that they can reassess your need for medications and monitor your lab values. Today  SUBJECTIVE   Feels a lot better. No more coughing blood(has tiny amount yday)  VITAL SIGNS:  Blood pressure (!) 143/54, pulse 66, temperature 98.1 F (36.7 C), temperature source Oral, resp. rate 19, height 4\' 11"  (1.499 m), weight 143 lb 6.4 oz (65 kg), SpO2 91 %.  I/O:   Intake/Output Summary (Last 24 hours) at 08/26/15 0930 Last data filed at 08/25/15 1900  Gross per 24 hour  Intake             1835 ml  Output                0 ml  Net             1835 ml    PHYSICAL EXAMINATION:  GENERAL:  82 y.o.-year-old patient lying in the bed with no acute distress.  EYES: Pupils equal, round, reactive to light and accommodation. No scleral icterus. Extraocular muscles intact.  HEENT: Head atraumatic, normocephalic. Oropharynx and nasopharynx clear.  NECK:  Supple, no jugular venous distention. No thyroid enlargement, no tenderness.  LUNGS: distnat breath sounds bilaterally, no wheezing, rales,rhonchi or crepitation. No use of accessory muscles of respiration.  CARDIOVASCULAR: S1, S2  normal. No murmurs, rubs, or gallops.  ABDOMEN: Soft, non-tender, non-distended. Bowel sounds present. No organomegaly or mass.  EXTREMITIES: No pedal edema, cyanosis, or clubbing.  NEUROLOGIC: Cranial nerves II through XII are intact. Muscle strength 5/5 in all extremities. Sensation intact. Gait not checked.  PSYCHIATRIC: The patient is alert and oriented x 3.  SKIN: No obvious rash, lesion, or ulcer.   DATA REVIEW:   CBC   Recent Labs Lab 08/25/15 0420  WBC 14.5*  HGB 9.9*  HCT 29.9*  PLT 226    Chemistries   Recent Labs Lab 08/24/15 1744 08/25/15 0420  NA 131* 135  K 4.7 4.1  CL 98* 103  CO2 25 27  GLUCOSE 141* 107*  BUN 19 17  CREATININE 1.03* 0.76  CALCIUM 9.2 8.9  AST 35  --   ALT 18  --   ALKPHOS 102  --   BILITOT 0.4  --     Microbiology Results   Recent Results (from the past 240 hour(s))  Culture, blood (Routine X 2) w Reflex to ID Panel     Status: None (Preliminary result)   Collection Time: 08/24/15  5:46 PM  Result Value Ref Range Status   Specimen Description BLOOD LT AC  Final   Special Requests BOTTLES DRAWN AEROBIC AND ANAEROBIC 3CC  Final   Culture NO GROWTH 2 DAYS  Final   Report Status PENDING  Incomplete  Culture, blood (Routine X 2) w Reflex to ID Panel     Status: None (Preliminary result)   Collection Time: 08/24/15  5:46 PM  Result Value Ref Range Status   Specimen Description BLOOD LT FA  Final   Special Requests BOTTLES DRAWN AEROBIC AND ANAEROBIC 3CC  Final   Culture NO GROWTH 2 DAYS  Final   Report Status PENDING  Incomplete  MRSA PCR Screening     Status: None   Collection Time: 08/25/15  3:02 AM  Result Value Ref Range Status   MRSA by PCR NEGATIVE NEGATIVE Final    Comment:        The GeneXpert MRSA Assay (FDA approved for NASAL specimens only), is one component of a comprehensive MRSA colonization surveillance program. It is not intended to diagnose MRSA infection nor to guide or monitor treatment for MRSA  infections.  RADIOLOGY:  Dg Lumbar Spine 2-3 Views  Result Date: 08/24/2015 CLINICAL DATA:  Fever and shortness of breath. Back pain and hip pain. EXAM: LUMBAR SPINE - 2-3 VIEW COMPARISON:  08/19/2015.  08/07/2015. FINDINGS: Curvature convex to the right as seen previously. Previous augmentation of an L4 fracture. Chronic degenerative changes elsewhere throughout the lumbar spine. No unfavorable spread of methylmethacrylate. IMPRESSION: Vertebral augmentation L4 without visible complication. Curvature convex to the right with extensive lumbar degenerative changes as seen previously. Electronically Signed   By: Nelson Chimes M.D.   On: 08/24/2015 20:42   Dg Chest Portable 1 View  Result Date: 08/24/2015 CLINICAL DATA:  Fever, shortness of breath, back surgery 5 days ago, known blood clot in LEFT leg, history asthma, hypertension EXAM: PORTABLE CHEST 1 VIEW COMPARISON:  Portable exam 1718 hours compared to 08/04/2015 FINDINGS: Rotated to the RIGHT. Enlargement of cardiac silhouette. Mediastinal contours and pulmonary vascularity normal. RIGHT lung infiltrates question pneumonia. Minimal atelectasis at lung bases. No pleural effusion or pneumothorax. Bones demineralized. Deformity due to remote LEFT clavicular fracture. IMPRESSION: Enlargement of cardiac silhouette. Bibasilar atelectasis with RIGHT perihilar infiltrate. Electronically Signed   By: Lavonia Dana M.D.   On: 08/24/2015 17:29     Management plans discussed with the patient, family and they are in agreement.  CODE STATUS:     Code Status Orders        Start     Ordered   08/24/15 2117  Full code  Continuous     08/24/15 2116    Code Status History    Date Active Date Inactive Code Status Order ID Comments User Context   08/24/2015  9:16 PM 08/25/2015 10:24 AM Full Code [id number]  Gladstone Lighter, MD Inpatient   06/27/2015  4:42 PM 06/30/2015  4:16 PM Partial Code [id number]  Bettey Costa, MD Inpatient   06/07/2015  8:08 PM  06/11/2015  7:26 PM Full Code [id number]  Fritzi Mandes, MD Inpatient   05/22/2015  1:52 PM 05/24/2015  4:12 PM Full Code [id number]  Algernon Huxley, MD Inpatient   03/11/2015  6:20 PM 03/16/2015  2:15 PM Full Code [id number]  Fritzi Mandes, MD Inpatient   02/27/2015  3:53 PM 03/03/2015  6:24 PM Full Code [id number]  Sela Hua, PA-C Inpatient    Advance Directive Documentation   Flowsheet Row Most Recent Value  Type of Advance Directive  Healthcare Power of Attorney  Pre-existing out of facility DNR order (yellow form or pink MOST form)  No data  "MOST" Form in Place?  No data      TOTAL TIME TAKING CARE OF THIS PATIENT: 40  minutes.    Summer Parthasarathy M.D on 08/26/2015 at 9:30 AM  Between 7am to 6pm - Pager - [phone] After 6pm go to [email or web address] - password [email or web address] [facility]  Office  [phone]  CC: Primary care physician; Rusty Aus, MD

## 2015-08-26 NOTE — Discharge Instructions (Signed)
HHRN

## 2015-08-28 LAB — ECHOCARDIOGRAM COMPLETE
Height: 59 in
WEIGHTICAEL: 2294.4 [oz_av]

## 2015-08-29 LAB — CULTURE, BLOOD (ROUTINE X 2)
Culture: NO GROWTH
Culture: NO GROWTH

## 2015-09-09 ENCOUNTER — Encounter: Payer: Self-pay | Admitting: Podiatry

## 2015-09-09 ENCOUNTER — Ambulatory Visit (INDEPENDENT_AMBULATORY_CARE_PROVIDER_SITE_OTHER): Payer: Medicare Other | Admitting: Podiatry

## 2015-09-09 DIAGNOSIS — B351 Tinea unguium: Secondary | ICD-10-CM

## 2015-09-09 DIAGNOSIS — L84 Corns and callosities: Secondary | ICD-10-CM

## 2015-09-09 DIAGNOSIS — M79676 Pain in unspecified toe(s): Secondary | ICD-10-CM | POA: Diagnosis not present

## 2015-09-16 NOTE — Progress Notes (Signed)
[facility]  Patient Status: closed, made initial contact with patient and she wanted to wait to start [facility] services until after vein surgery. Called back to schedule and stated she didn't know she had [place] services setup. She wanted call back, we attempted numerous call backs with no return. Episode closed.       Florene Glen 09/16/2015, 8:56 AM

## 2015-09-22 NOTE — Progress Notes (Signed)
Patient ID: Natasha [name], female   DOB: Aug 28, 1928, 82 y.o.   MRN: [id number]  Subjective: 82 y.o. returns the office today for painful, elongated, thickened toenails which she cannot trim herself. Denies any redness or drainage around the nails. Also has painful calluses to both of her feet which have become painful again. Denies any acute changes since last appointment and no new complaints today. Denies any systemic complaints such as fevers, chills, nausea, vomiting.   Objective: AAO 3, NAD DP/PT pulses palpable, CRT less than 3 seconds Nails hypertrophic, dystrophic, elongated, brittle, discolored 10. There is tenderness overlying the nails 1-5 bilaterally. There is no surrounding erythema or drainage along the nail sites. Hyperkeratotic lesion right foot sub-metatarsal 1 and left distal 3rd toe. Upon debridement there is no underlying ulceration however on the right foot the area is pre-ulcerative. No edema, erythema or signs of infection. No drainage.  Hammertoes are present. No open lesions or pre-ulcerative lesions are identified. No other areas of tenderness bilateral lower extremities. No overlying edema, erythema, increased warmth. No pain with calf compression, swelling, warmth, erythema.  Assessment: Patient presents with symptomatic onychomycosis; hyperkeratotic lesions  Plan: -Treatment options including alternatives, risks, complications were discussed -Nails sharply debrided 10 without complication/bleeding. -Hyperkeratotic lesions debrided 2 without complications or bleeding. -If there is any further skin breakdown the call the office and she verbally understood this. -Discussed daily foot inspection. If there are any changes, to call the office immediately.  -Follow-up as scheduled or sooner if any problems are to arise. In the meantime, encouraged to call the office with any questions, concerns, changes symptoms.  Celesta Gentile, DPM

## 2015-09-23 ENCOUNTER — Encounter: Payer: Self-pay | Admitting: Podiatry

## 2015-09-23 ENCOUNTER — Ambulatory Visit (INDEPENDENT_AMBULATORY_CARE_PROVIDER_SITE_OTHER): Payer: Medicare Other | Admitting: Podiatry

## 2015-09-23 DIAGNOSIS — M79676 Pain in unspecified toe(s): Secondary | ICD-10-CM

## 2015-09-23 DIAGNOSIS — L97522 Non-pressure chronic ulcer of other part of left foot with fat layer exposed: Secondary | ICD-10-CM

## 2015-09-23 DIAGNOSIS — L84 Corns and callosities: Secondary | ICD-10-CM

## 2015-09-23 DIAGNOSIS — L97421 Non-pressure chronic ulcer of left heel and midfoot limited to breakdown of skin: Secondary | ICD-10-CM

## 2015-09-23 DIAGNOSIS — B351 Tinea unguium: Secondary | ICD-10-CM

## 2015-09-23 DIAGNOSIS — L97529 Non-pressure chronic ulcer of other part of left foot with unspecified severity: Secondary | ICD-10-CM

## 2015-09-23 DIAGNOSIS — M79606 Pain in leg, unspecified: Secondary | ICD-10-CM

## 2015-09-23 NOTE — Progress Notes (Addendum)
Subjective:   A 82 year old female presents with her husband for evaluation of the left third digit ulcer she states is very painful. Patient also has a painful callus to the right foot. Patient and her husband state that she has appointment with vascular for a revascularization procedure on September 20. Patient states she has no flow to the left lower extremity. Patient is hard of hearing  Objective: Physical Exam General: The patient is alert and oriented x3 in no acute distress.  Dermatology: Skin is warm, dry and supple bilateral lower extremities. Negative for open lesions or macerations.  Vascular: Palpable pedal pulses bilaterally. No edema or erythema noted. Capillary refill within normal limits.  Neurological: Epicritic and protective threshold grossly intact bilaterally.   Musculoskeletal Exam: Range of motion within normal limits to all pedal and ankle joints bilateral. Muscle strength 5/5 in all groups bilateral.    Assessment: #1 peripheral vascular disease #2 ulcer third digit left foot secondary to peripheral vascular disease #3 callus plantar aspect of the first MPJ right foot-painful #4 pain in bilateral feet  Problem List Items Addressed This Visit    None    Visit Diagnoses    Ulcer of toe, left, with fat layer exposed ([place])    -  Primary   3rd digit distal tuft         Plan of Care:  #1 Patient was evaluated. #2 Medically necessary excisional debridement including subcutaneous tissues performed third digit left foot distal tuft. Excisional debridement of all the necrotic nonviable tissue down to healthy bleeding viable tissues performed with post-debridement measurements and was pre-the measurements are 0.2cm in length by 0.2 cm in width by 0.1 cm in depth.  #3 mechanical debridement of hyperkeratotic painful callus lesion first MPJ right foot plantar was performed using a chisel blade  #4 patient is to follow up in 2 weeks after her revascularization  procedure for routine follow-up evaluation.   Dr. [name], [place]

## 2015-09-24 ENCOUNTER — Telehealth: Payer: Self-pay | Admitting: Podiatry

## 2015-09-24 NOTE — Telephone Encounter (Signed)
Pt called and was to have a rx for fungal cream called into [facility] on [address] in [place] and they have not gotten it. Can we let pt know when its been called in

## 2015-09-30 ENCOUNTER — Ambulatory Visit: Payer: Medicare Other | Admitting: Podiatry

## 2015-10-10 ENCOUNTER — Ambulatory Visit: Payer: Medicare Other | Admitting: Podiatry

## 2015-10-28 DIAGNOSIS — G9009 Other idiopathic peripheral autonomic neuropathy: Secondary | ICD-10-CM | POA: Diagnosis not present

## 2015-10-28 DIAGNOSIS — I739 Peripheral vascular disease, unspecified: Secondary | ICD-10-CM

## 2015-10-28 DIAGNOSIS — J690 Pneumonitis due to inhalation of food and vomit: Secondary | ICD-10-CM | POA: Diagnosis not present

## 2015-10-28 DIAGNOSIS — I10 Essential (primary) hypertension: Secondary | ICD-10-CM

## 2015-10-28 DIAGNOSIS — J9611 Chronic respiratory failure with hypoxia: Secondary | ICD-10-CM

## 2015-11-19 ENCOUNTER — Emergency Department: Payer: Medicare Other

## 2015-11-19 ENCOUNTER — Emergency Department
Admission: EM | Admit: 2015-11-19 | Discharge: 2015-11-19 | Payer: Medicare Other | Attending: Student in an Organized Health Care Education/Training Program | Admitting: Student in an Organized Health Care Education/Training Program

## 2015-11-19 DIAGNOSIS — J45909 Unspecified asthma, uncomplicated: Secondary | ICD-10-CM | POA: Diagnosis not present

## 2015-11-19 DIAGNOSIS — Z79899 Other long term (current) drug therapy: Secondary | ICD-10-CM | POA: Insufficient documentation

## 2015-11-19 DIAGNOSIS — L089 Local infection of the skin and subcutaneous tissue, unspecified: Secondary | ICD-10-CM

## 2015-11-19 DIAGNOSIS — I120 Hypertensive chronic kidney disease with stage 5 chronic kidney disease or end stage renal disease: Secondary | ICD-10-CM | POA: Insufficient documentation

## 2015-11-19 DIAGNOSIS — T814XXA Infection following a procedure, initial encounter: Secondary | ICD-10-CM | POA: Diagnosis present

## 2015-11-19 DIAGNOSIS — Z7982 Long term (current) use of aspirin: Secondary | ICD-10-CM | POA: Insufficient documentation

## 2015-11-19 DIAGNOSIS — Y658 Other specified misadventures during surgical and medical care: Secondary | ICD-10-CM | POA: Insufficient documentation

## 2015-11-19 DIAGNOSIS — N185 Chronic kidney disease, stage 5: Secondary | ICD-10-CM | POA: Diagnosis not present

## 2015-11-19 DIAGNOSIS — Z87891 Personal history of nicotine dependence: Secondary | ICD-10-CM | POA: Diagnosis not present

## 2015-11-19 DIAGNOSIS — T148XXA Other injury of unspecified body region, initial encounter: Secondary | ICD-10-CM

## 2015-11-19 HISTORY — DX: Polyneuropathy, unspecified: G62.9

## 2015-11-19 HISTORY — DX: Hypokalemia: E87.6

## 2015-11-19 HISTORY — DX: Disorder of kidney and ureter, unspecified: N28.9

## 2015-11-19 HISTORY — DX: Chronic respiratory failure with hypoxia: J96.11

## 2015-11-19 LAB — CBC WITH DIFFERENTIAL/PLATELET
Basophils Absolute: 0.1 10*3/uL (ref 0–0.1)
Basophils Relative: 1 %
Eosinophils Absolute: 0.4 10*3/uL (ref 0–0.7)
Eosinophils Relative: 2 %
HEMATOCRIT: 32.9 % — AB (ref 35.0–47.0)
HEMOGLOBIN: 10.6 g/dL — AB (ref 12.0–16.0)
LYMPHS ABS: 1.3 10*3/uL (ref 1.0–3.6)
Lymphocytes Relative: 8 %
MCH: 29.8 pg (ref 26.0–34.0)
MCHC: 32.3 g/dL (ref 32.0–36.0)
MCV: 92.3 fL (ref 80.0–100.0)
MONO ABS: 0.6 10*3/uL (ref 0.2–0.9)
MONOS PCT: 4 %
NEUTROS ABS: 13.2 10*3/uL — AB (ref 1.4–6.5)
NEUTROS PCT: 85 %
Platelets: 303 10*3/uL (ref 150–440)
RBC: 3.56 MIL/uL — ABNORMAL LOW (ref 3.80–5.20)
RDW: 23.6 % — AB (ref 11.5–14.5)
WBC: 15.6 10*3/uL — ABNORMAL HIGH (ref 3.6–11.0)

## 2015-11-19 LAB — BASIC METABOLIC PANEL
Anion gap: 8 (ref 5–15)
BUN: 19 mg/dL (ref 6–20)
CHLORIDE: 94 mmol/L — AB (ref 101–111)
CO2: 35 mmol/L — AB (ref 22–32)
CREATININE: 0.88 mg/dL (ref 0.44–1.00)
Calcium: 9.5 mg/dL (ref 8.9–10.3)
GFR calc Af Amer: >60 mL/min (ref >60)
GFR calc non Af Amer: 58 mL/min — ABNORMAL LOW (ref >60)
GLUCOSE: 105 mg/dL — AB (ref 65–99)
Potassium: 3.8 mmol/L (ref 3.5–5.1)
Sodium: 137 mmol/L (ref 135–145)

## 2015-11-19 MED ORDER — VANCOMYCIN HCL IN DEXTROSE 1-5 GM/200ML-% IV SOLN
1000.0000 mg | Freq: Once | INTRAVENOUS | Status: AC
  Administered 2015-11-19: 1000 mg via INTRAVENOUS
  Filled 2015-11-19: qty 200

## 2015-11-19 MED ORDER — PIPERACILLIN-TAZOBACTAM 3.375 G IVPB 30 MIN
3.3750 g | Freq: Once | INTRAVENOUS | Status: AC
  Administered 2015-11-19: 3.375 g via INTRAVENOUS
  Filled 2015-11-19: qty 50

## 2015-11-19 NOTE — ED Notes (Signed)
Patient transported to X-ray 

## 2015-11-19 NOTE — ED Triage Notes (Signed)
Per EMS, pt [facility] who had DVT surgery on her left leg October 5th at [facility]. Pt presents with swelling and redness to left leg and reports there is a staple left in the leg. Pt has not had fevers but pt reports increased pain and leg is tender to touch. Pt A&O at this time.

## 2015-11-19 NOTE — ED Notes (Signed)
PT was able to ambulate with one assist. No distress noted.

## 2015-11-19 NOTE — ED Notes (Signed)
EMS in room with pt. Pt aware of transfer.

## 2015-11-19 NOTE — ED Provider Notes (Addendum)
[facility] Emergency Department Provider Note  ____________________________________________   I have reviewed the triage vital signs and the nursing notes.   HISTORY  Chief Complaint Wound Infection (Leg)    HPI Natasha [name] is a 82 y.o. female pt had LLE bypass graft done on 10/5, she states that she has had redness and pain to that lower leg going all over to the groin over the last couple days. She is somewhat of a limited historian but does tell me that she has not had any fever or vomiting or other systemic illness. She has had some bleeding and some discharge from the lower wound and she is concerned because he left the staple in there.     Past Medical History:  Diagnosis Date  . Anemia   . Arthritis   . Asthma   . Bilateral pneumonia may 2017  . Bronchitis   . Cardiomegaly   . Chronic respiratory failure with hypoxia ([place])   . Cochlear implant in place    bilateral, [name], 04/26/2007   . Gallstones 2015  . GERD (gastroesophageal reflux disease)   . H/O blood clots 2014  . History of home oxygen therapy    at night  . Hypertension   . Hypokalemia   . Peripheral vascular disease ([place])    with arterial clots- on xarelto  . Polyneuropathy ([place])   . Renal disorder    Chronic Kidney Disease, Stage 5  . Restless leg syndrome     Patient Active Problem List   Diagnosis Date Noted  . Sepsis ([place]) 08/24/2015  . HCAP (healthcare-associated pneumonia) 06/27/2015  . Pneumonia 06/07/2015  . Hypotension 03/11/2015  . Lower extremity atheroembolism ([place]) 02/27/2015  . Ischemic leg 02/27/2015  . Calculus of gallbladder with other cholecystitis, without mention of obstruction 04/20/2013    Past Surgical History:  Procedure Laterality Date  . ABDOMINAL HYSTERECTOMY  1960  . APPENDECTOMY  1946  . BREAST BIOPSY Bilateral    negative  . CHOLECYSTECTOMY  04-24-13  . COCHLEAR IMPLANT  2009  . COLONOSCOPY  2015   Dr. Rayann Heman   .  EYE SURGERY  2008,2009   cataract  . KYPHOPLASTY N/A 08/19/2015   Procedure: KYPHOPLASTY;  Surgeon: Hessie Knows, MD;  Location: [facility] ORS;  Service: Orthopedics;  Laterality: N/A;  . PERIPHERAL VASCULAR CATHETERIZATION N/A 02/27/2015   Procedure: Abdominal Aortogram w/Lower Extremity;  Surgeon: Algernon Huxley, MD;  Location: [place] CV [facility];  Service: Cardiovascular;  Laterality: N/A;  . PERIPHERAL VASCULAR CATHETERIZATION  02/27/2015   Procedure: Lower Extremity Intervention;  Surgeon: Algernon Huxley, MD;  Location: [place] [facility];  Service: Cardiovascular;;  . PERIPHERAL VASCULAR CATHETERIZATION Left 02/28/2015   Procedure: Lower Extremity Angiography;  Surgeon: Algernon Huxley, MD;  Location: [place] [facility];  Service: Cardiovascular;  Laterality: Left;  . PERIPHERAL VASCULAR CATHETERIZATION  02/28/2015   Procedure: Lower Extremity Intervention;  Surgeon: Algernon Huxley, MD;  Location: [place] [facility];  Service: Cardiovascular;;  . PERIPHERAL VASCULAR CATHETERIZATION Left 05/22/2015   Procedure: Lower Extremity Angiography;  Surgeon: Algernon Huxley, MD;  Location: [place] [facility];  Service: Cardiovascular;  Laterality: Left;  . PERIPHERAL VASCULAR CATHETERIZATION  05/22/2015   Procedure: Lower Extremity Intervention;  Surgeon: Algernon Huxley, MD;  Location: [place] [facility] LAB;  Service: Cardiovascular;;  . PERIPHERAL VASCULAR CATHETERIZATION Left 05/23/2015   Procedure: Lower Extremity Angiography;  Surgeon: Algernon Huxley, MD;  [facility] INV[facility] CV  [facility];  Service: Cardiovascular;  Laterality: Left;  . PERIPHERAL VASCULAR CATHETERIZATION  05/23/2015   Procedure: Lower Extremity Intervention;  Surgeon: Algernon Huxley, MD;  Location: [place] CV LAB;  Service: Cardiovascular;;  . stent placement  2006-2014   multiple stent placements in legs    Prior to Admission medications   Medication Sig Start Date End Date Taking? Authorizing Provider  albuterol (PROVENTIL HFA;VENTOLIN HFA) 108 (90  BASE) MCG/ACT inhaler Inhale 2 puffs into the lungs 2 (two) times daily as needed for wheezing or shortness of breath.     Historical Provider, MD  albuterol (PROVENTIL) (2.5 MG/3ML) 0.083% nebulizer solution Take 3 mLs (2.5 mg total) by nebulization every 6 (six) hours as needed for wheezing or shortness of breath. 06/11/15   Loletha Grayer, MD  amoxicillin-clavulanate (AUGMENTIN) 875-125 MG tablet Take 1 tablet by mouth every 12 (twelve) hours. 08/26/15   Fritzi Mandes, MD  aspirin EC 325 MG tablet Take 325 mg by mouth at bedtime.     Historical Provider, MD  budesonide (PULMICORT) 180 MCG/ACT inhaler Inhale 2 puffs into the lungs 2 (two) times daily.    Historical Provider, MD  Calcium Carbonate-Vit D-Min (CALCIUM 1200) 1200-1000 MG-UNIT CHEW Chew 1 tablet by mouth daily.    Historical Provider, MD  cholecalciferol (VITAMIN D) 1000 units tablet Take 1,000 Units by mouth every morning.     Historical Provider, MD  diltiazem (CARDIZEM CD) 180 MG 24 hr capsule Take 180 mg by mouth every morning.     Historical Provider, MD  docusate sodium (COLACE) 100 MG capsule Take 400 mg by mouth at bedtime.    Historical Provider, MD  Esomeprazole Magnesium (NEXIUM PO) Take 22.3 mg by mouth 2 (two) times daily.     Historical Provider, MD  ferrous sulfate 325 (65 FE) MG tablet Take 1 tablet (325 mg total) by mouth daily. 05/24/15   Kimberly A Stegmayer, PA-C  furosemide (LASIX) 20 MG tablet Take 20 mg by mouth 2 (two) times daily.    Historical Provider, MD  gabapentin (NEURONTIN) 300 MG capsule Take 300 mg by mouth 2 (two) times daily.    Historical Provider, MD  glucosamine-chondroitin 500-400 MG tablet Take 1 tablet by mouth daily.    Historical Provider, MD  MAGNESIUM PO Take 1 tablet by mouth at bedtime.     Historical Provider, MD  Melatonin 5 MG TABS Take 5 mg by mouth at bedtime.     Historical Provider, MD  montelukast (SINGULAIR) 10 MG tablet Take 10 mg by mouth every morning.     Historical Provider, MD   oxyCODONE (ROXICODONE) 5 MG immediate release tablet Take 1 tablet (5 mg total) by mouth every 4 (four) hours as needed for severe pain. 08/19/15   Hessie Knows, MD  oxyCODONE-acetaminophen (PERCOCET/ROXICET) 5-325 MG tablet Take 1 tablet by mouth daily as needed for severe pain.     Historical Provider, MD  polyethylene glycol (MIRALAX / GLYCOLAX) packet Take 17 g by mouth daily as needed. Patient taking differently: Take 17 g by mouth 2 (two) times daily. Mix in 8 oz liquid and drink 06/11/15   Loletha Grayer, MD  potassium chloride SA (K-DUR,KLOR-CON) 20 MEQ tablet Take 20-40 mEq by mouth See admin instructions. Take 2 tablets (40 mEq) by mouth every morning, Take 20 mEq by mouth every afternoon, and Take 2 tablets (40 mEq) by mouth every night at bedtime.    Historical Provider, MD  pramipexole (MIRAPEX) 0.5 MG tablet Take 0.5 mg by  mouth 2 (two) times daily. 06/30/15   Historical Provider, MD  rivaroxaban (XARELTO) 20 MG TABS tablet Take 20 mg by mouth every morning.     Historical Provider, MD  simvastatin (ZOCOR) 20 MG tablet Take 20 mg by mouth at bedtime. 07/03/15   Historical Provider, MD    Allergies Codeine; Hydrocodone; Levofloxacin; and Tramadol  Family History  Problem Relation Age of Onset  . Heart disease Mother   . Heart disease Father   . Cancer Sister     lung  . Breast cancer Daughter     Social History Social History  Substance Use Topics  . Smoking status: Former Smoker    Types: Cigarettes  . Smokeless tobacco: Never Used  . Alcohol use No    Review of Systems Constitutional: No fever/chills Eyes: No visual changes. ENT: No sore throat. No stiff neck no neck pain Cardiovascular: Denies chest pain. Respiratory: Denies shortness of breath. Gastrointestinal:   no vomiting.  No diarrhea.  No constipation. Genitourinary: Negative for dysuria. Musculoskeletal: Negative lower extremity swelling Skin: Negative for rash. Neurological: Negative for severe  headaches, focal weakness or numbness. 10-point ROS otherwise negative.  ____________________________________________   PHYSICAL EXAM:  VITAL SIGNS: ED Triage Vitals  Enc Vitals Group     BP 11/19/15 1233 122/68     Pulse Rate 11/19/15 1233 84     Resp 11/19/15 1233 (!) 23     Temp 11/19/15 1233 98.7 F (37.1 C)     Temp Source 11/19/15 1233 Oral     SpO2 11/19/15 1233 99 %     Weight 11/19/15 1234 138 lb (62.6 kg)     Height 11/19/15 1234 4\' 11"  (1.499 m)     Head Circumference --      Peak Flow --      Pain Score 11/19/15 1234 4     Pain Loc --      Pain Edu? --      Excl. in [place]? --     Constitutional: Alert and oriented. Well appearing and in no acute distress. Mouth/Throat: Mucous membranes are moist.  Oropharynx non-erythematous. Neck: No stridor.   Nontender with no meningismus Cardiovascular: Normal rate, regular rhythm. Grossly normal heart sounds.  Good peripheral circulation. Respiratory: Normal respiratory effort.  No retractions. Lungs CTAB. Abdominal: Soft and nontender. No distention. No guarding no rebound Musculoskeletal: Patient has erythema to the lower leg circumferentially and heat, she does have no crepitus, there are strong distal pulses fortunately. The patient has intact sensation. The wound itself has some areas of mild dehiscence with purulent appearing drainage. There is erythema going up towards the groin. No joint effusions, no DVT signs strong distal pulses positive mild (right edema which was noted on prior visit Neurologic:  Normal speech and language. No gross focal neurologic deficits are appreciated.  Skin:  Skin is warm, dry and intact. No rash noted. Psychiatric: Mood and affect are normal. Speech and behavior are normal.  ____________________________________________   LABS (all labs ordered are listed, but only abnormal results are displayed)  Labs Reviewed  CBC WITH DIFFERENTIAL/PLATELET - Abnormal; Notable for the following:        Result Value   WBC 15.6 (*)    RBC 3.56 (*)    Hemoglobin 10.6 (*)    HCT 32.9 (*)    RDW 23.6 (*)    Neutro Abs 13.2 (*)    All other components within normal limits  CULTURE, BLOOD (ROUTINE X 2)  CULTURE, BLOOD (  ROUTINE X 2)  AEROBIC CULTURE (SUPERFICIAL SPECIMEN)  BASIC METABOLIC PANEL   ____________________________________________  EKG  I personally interpreted any EKGs ordered by me or triage  ____________________________________________  RADIOLOGY  I reviewed any imaging ordered by me or triage that were performed during my shift and, if possible, patient and/or family made aware of any abnormal findings. ____________________________________________   PROCEDURES  Procedure(s) performed: None  Procedures  Critical Care performed: None  ____________________________________________   INITIAL IMPRESSION / ASSESSMENT AND PLAN / ED COURSE  Pertinent labs & imaging results that were available during my care of the patient were reviewed by me and considered in my medical decision making (see chart for details).  Patient is a partially one-month postop for a lower leg bypass graft with evidence of cellulitic changes and wound infection. We have written for empiric MRSA coverage, I have cultured the wound and we will culture the patient. Vital signs are reassuring she is nontoxic in appearance. This was done at [facility]. [facility] call them for transfer will get her white count.  ----------------------------------------- 3:39 PM on 11/19/2015 -----------------------------------------  Awaiting [facility] input signed out to dr. Quentin Cornwall at the end of my shift.  Clinical Course    ____________________________________________   FINAL CLINICAL IMPRESSION(S) / ED DIAGNOSES  Final diagnoses:  None      This chart was dictated using voice recognition software.  Despite best efforts to proofread,  errors can occur which can change meaning.      Schuyler Amor, MD 11/19/15 [place], MD 11/19/15 1539

## 2015-11-19 NOTE — ED Provider Notes (Signed)
Patient received in signout from Dr. Burlene Arnt. A spoke with do transfer center and Dr. Verner Mould patient's surgery has agreed to accept the patient and transfer to their ED for admission for operative management.  Have discussed with the patient and available family all diagnostics and treatments performed thus far and all questions were answered to the best of my ability. The patient demonstrates understanding and agreement with plan.    Merlyn Lot, MD 11/19/15 [phone]

## 2015-11-22 LAB — AEROBIC CULTURE  (SUPERFICIAL SPECIMEN)

## 2015-11-22 LAB — AEROBIC CULTURE W GRAM STAIN (SUPERFICIAL SPECIMEN)

## 2015-11-24 LAB — CULTURE, BLOOD (ROUTINE X 2)
Culture: NO GROWTH
Culture: NO GROWTH

## 2016-01-15 ENCOUNTER — Encounter: Payer: Self-pay | Admitting: Podiatry

## 2016-01-15 ENCOUNTER — Ambulatory Visit (INDEPENDENT_AMBULATORY_CARE_PROVIDER_SITE_OTHER): Payer: Medicare Other | Admitting: Podiatry

## 2016-01-15 ENCOUNTER — Ambulatory Visit (INDEPENDENT_AMBULATORY_CARE_PROVIDER_SITE_OTHER): Payer: Medicare Other

## 2016-01-15 DIAGNOSIS — M79672 Pain in left foot: Secondary | ICD-10-CM | POA: Diagnosis not present

## 2016-01-15 DIAGNOSIS — M2032 Hallux varus (acquired), left foot: Secondary | ICD-10-CM | POA: Diagnosis not present

## 2016-01-15 DIAGNOSIS — B351 Tinea unguium: Secondary | ICD-10-CM

## 2016-01-15 DIAGNOSIS — L97522 Non-pressure chronic ulcer of other part of left foot with fat layer exposed: Secondary | ICD-10-CM

## 2016-01-15 DIAGNOSIS — M79609 Pain in unspecified limb: Secondary | ICD-10-CM | POA: Diagnosis not present

## 2016-01-15 DIAGNOSIS — I83025 Varicose veins of left lower extremity with ulcer other part of foot: Secondary | ICD-10-CM

## 2016-01-15 DIAGNOSIS — I739 Peripheral vascular disease, unspecified: Secondary | ICD-10-CM

## 2016-01-15 DIAGNOSIS — L84 Corns and callosities: Secondary | ICD-10-CM

## 2016-01-15 DIAGNOSIS — L603 Nail dystrophy: Secondary | ICD-10-CM

## 2016-01-15 DIAGNOSIS — L851 Acquired keratosis [keratoderma] palmaris et plantaris: Secondary | ICD-10-CM | POA: Diagnosis not present

## 2016-01-15 DIAGNOSIS — L97529 Non-pressure chronic ulcer of other part of left foot with unspecified severity: Secondary | ICD-10-CM

## 2016-01-15 DIAGNOSIS — M79671 Pain in right foot: Secondary | ICD-10-CM | POA: Diagnosis not present

## 2016-01-15 DIAGNOSIS — L608 Other nail disorders: Secondary | ICD-10-CM

## 2016-01-18 NOTE — Progress Notes (Signed)
Subjective:  Patient presents today for evaluation of an ulceration to the great toe left foot. Patient also presents for evaluation of painful, elongated, thickened, discolored toenails 1-5 bilateral. Patient also has painful callus lesions noted to the lateral feet. Patient experiences significant pain during ambulation. Patient is hard of hearing.   Objective/Physical Exam General: The patient is alert and oriented x3 in no acute distress.  Dermatology:  Wound #1 noted to the distal tuft of the left great toe measuring [phone] cm (LxWxD).   To the noted ulceration(s), there is no eschar. There is a moderate amount of slough, fibrin, and necrotic tissue noted. Granulation tissue and wound base is red. There is a minimal amount of serosanguineous drainage noted. There is no exposed bone muscle-tendon ligament or joint. There is no malodor. Periwound integrity is intact. Thickened, discolored, hyperkeratotic nails noted 1-5 bilateral. Hyperkeratotic callus lesions also noted to the bilateral forefoot 3. Skin is warm, dry and supple bilateral lower extremities.  Vascular: Palpable pedal pulses bilaterally. Mild edema noted. Capillary refill within normal limits. Varicosities noted bilateral lower extremities.   Neurological: Epicritic and protective threshold absent bilaterally.   Musculoskeletal Exam: Range of motion within normal limits to all pedal and ankle joints bilateral. Muscle strength 5/5 in all groups bilateral.   Assessment: #1 ulceration distal tuft left great toe secondary to venous insufficiency #2 varicosities bilateral lower extremities #3 onychodystrophy, hyperkeratotic, onychomycosis of toenails 1-5 bilateral #4 painful callus lesions bilateral forefoot 3  Plan of Care:  #1 Patient was evaluated. #2 medically necessary excisional debridement including subcutaneous tissue was performed using a tissue nipper and a chisel blade. Excisional debridement of all the  necrotic nonviable tissue down to healthy bleeding viable tissue was performed with post-debridement measurements same as pre-. #3 the wound was cleansed with normal saline. #4 dry sterile dressing applied #5 excisional debridement of painful callus lesions was performed using a chisel blade without incident or bleeding 3  #6 mechanical debridement of nails 1-5 bilaterally was performed using a nail nipper without incident  #7 return to clinic in 3 months   [name], DPM [facility]  Dr. [name], [address]                                        [place], [place] [address]                Office [phone]  Fax [phone]

## 2016-01-19 ENCOUNTER — Emergency Department: Payer: Medicare Other

## 2016-01-19 ENCOUNTER — Encounter: Payer: Self-pay | Admitting: Emergency Medicine

## 2016-01-19 ENCOUNTER — Encounter: Payer: Medicare Other | Attending: Surgery | Admitting: Surgery

## 2016-01-19 ENCOUNTER — Emergency Department
Admission: EM | Admit: 2016-01-19 | Discharge: 2016-01-19 | Disposition: A | Discharge status: Home / Self Care | Payer: Medicare Other | Attending: Emergency Medicine | Admitting: Emergency Medicine

## 2016-01-19 DIAGNOSIS — M79671 Pain in right foot: Secondary | ICD-10-CM | POA: Diagnosis present

## 2016-01-19 DIAGNOSIS — L97122 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 exposed: Secondary | ICD-10-CM | POA: Diagnosis not present

## 2016-01-19 DIAGNOSIS — Z7982 Long term (current) use of aspirin: Secondary | ICD-10-CM | POA: Insufficient documentation

## 2016-01-19 DIAGNOSIS — J45909 Unspecified asthma, uncomplicated: Secondary | ICD-10-CM | POA: Insufficient documentation

## 2016-01-19 DIAGNOSIS — J449 Chronic obstructive pulmonary disease, unspecified: Secondary | ICD-10-CM | POA: Insufficient documentation

## 2016-01-19 DIAGNOSIS — I70242 Atherosclerosis 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ration of calf: Secondary | ICD-10-CM | POA: Insufficient documentation

## 2016-01-19 DIAGNOSIS — I120 Hypertensive chronic kidney disease with stage 5 chronic kidney disease or end stage renal disease: Secondary | ICD-10-CM | POA: Diagnosis not present

## 2016-01-19 DIAGNOSIS — D649 Anemia, unspecified: Secondary | ICD-10-CM | POA: Insufficient documentation

## 2016-01-19 DIAGNOSIS — I10 Essential (primary) hypertension: Secondary | ICD-10-CM | POA: Diagnosis not present

## 2016-01-19 DIAGNOSIS — Z87891 Personal history of nicotine dependence: Secondary | ICD-10-CM | POA: Insufficient documentation

## 2016-01-19 DIAGNOSIS — E1140 Type 2 diabetes mellitus with diabetic neuropathy, unspecified: Secondary | ICD-10-CM | POA: Diagnosis not present

## 2016-01-19 DIAGNOSIS — G500 Trigeminal neuralgia: Secondary | ICD-10-CM | POA: Insufficient documentation

## 2016-01-19 DIAGNOSIS — E11622 Type 2 diabetes mellitus with other skin ulcer: Secondary | ICD-10-CM | POA: Diagnosis not present

## 2016-01-19 DIAGNOSIS — I2510 Atherosclerotic heart disease of native coronary artery without angina pectoris: Secondary | ICD-10-CM | POA: Insufficient documentation

## 2016-01-19 DIAGNOSIS — M722 Plantar fascial fibromatosis: Secondary | ICD-10-CM | POA: Diagnosis not present

## 2016-01-19 DIAGNOSIS — L97222 Non-pressure chronic ulcer of left calf with fat layer exposed: Secondary | ICD-10-CM | POA: Insufficient documentation

## 2016-01-19 DIAGNOSIS — Z79899 Other long term (current) drug therapy: Secondary | ICD-10-CM | POA: Insufficient documentation

## 2016-01-19 DIAGNOSIS — K219 Gastro-esophageal reflux disease without esophagitis: Secondary | ICD-10-CM | POA: Diagnosis not present

## 2016-01-19 DIAGNOSIS — N185 Chronic kidney disease, stage 5: Secondary | ICD-10-CM | POA: Diagnosis not present

## 2016-01-19 DIAGNOSIS — M1990 Unspecified osteoarthritis, unspecified site: Secondary | ICD-10-CM | POA: Insufficient documentation

## 2016-01-19 DIAGNOSIS — G2581 Restless legs syndrome: Secondary | ICD-10-CM | POA: Insufficient documentation

## 2016-01-19 MED ORDER — PREDNISONE 10 MG PO TABS: 10.0000 mg | ORAL_TABLET | Freq: Every day | ORAL | 0 refills | Status: DC

## 2016-01-19 NOTE — Progress Notes (Addendum)
JERICKA, KADAR ([id number]) Visit Report for 01/19/2016 Allergy List Details Patient Name: Natasha [name], Natasha [name] Date of Service: 01/19/2016 1:15 PM Medical Record Number: [id number] Patient Account Number: [id number] Date of Birth/Sex: July 31, 1928 (83 y.o. Female) Treating RN: Ahmed Prima Primary Care Physician: Emily Filbert Other Clinician: Referring Physician: Maura Crandall Treating Physician/Extender: Frann Rider in Treatment: 0 Allergies Active Allergies codeine hydrocodone levofloxacin tramadol Allergy Notes Electronic Signature(s) Signed: 01/19/2016 3:33:30 PM By: Alric Quan Entered By: Alric Quan on 01/19/2016 13:49:33 Natasha [name] ([id number]) -------------------------------------------------------------------------------- Arrival Information Details Patient Name: Natasha [name] Date of Service: 01/19/2016 1:15 PM Medical Record Number: [id number] Patient Account Number: [id number] Date of Birth/Sex: 1928-06-09 (83 y.o. Female) Treating RN: Ahmed Prima Primary Care Physician: Emily Filbert Other Clinician: Referring Physician: Maura Crandall Treating Physician/Extender: Frann Rider in Treatment: 0 Visit Information Patient Arrived: Wheel Chair Arrival Time: 13:44 Accompanied By: husband Transfer Assistance: EasyPivot Patient Lift Patient Identification Verified: Yes Secondary Verification Process Yes Completed: Patient Requires Transmission- No Based Precautions: Patient Has Alerts: Yes Patient Alerts: Patient on Blood Thinner Plavix Electronic Signature(s) Signed: 01/19/2016 3:33:30 PM By: Alric Quan Entered By: Alric Quan on 01/19/2016 13:47:27 Natasha [name] ([id number]) -------------------------------------------------------------------------------- Clinic Level of Care Assessment Details Patient Name: Natasha [name] Date of Service: 01/19/2016 1:15 PM Medical Record Number:  [id number] Patient Account Number: [id number] Date of Birth/Sex: 07-Feb-1928 (83 y.o. Female) Treating RN: Ahmed Prima Primary Care Physician: Emily Filbert Other Clinician: Referring Physician: Maura Crandall Treating Physician/Extender: Frann Rider in Treatment: 0 Clinic Level of Care Assessment Items TOOL 1 Quantity Score X - Use when EandM and Procedure is performed on INITIAL visit 1 0 ASSESSMENTS - Nursing Assessment / Reassessment X - General Physical Exam (combine w/ comprehensive assessment (listed just 1 20 below) when performed on new pt. evals) X - Comprehensive Assessment (HX, ROS, Risk Assessments, Wounds Hx, etc.) 1 25 ASSESSMENTS - Wound and Skin Assessment / Reassessment _0  - Dermatologic / Skin Assessment (not related to wound area) 0 ASSESSMENTS - Ostomy and/or Continence Assessment and Care _1  - Incontinence Assessment and Management 0 _2  - Ostomy Care Assessment and Management (repouching, etc.) 0 PROCESS - Coordination of Care _3  - Simple Patient / Family Education for ongoing care 0 X - Complex (extensive) Patient / Family Education for ongoing care 1 20 X - Staff obtains Programmer, systems, Records, Test Results / Process Orders 1 10 X - Staff telephones HHA, Nursing Homes / Clarify orders / etc 1 10 _4  - Routine Transfer to another Facility (non-emergent condition) 0 _5  - Routine Hospital Admission (non-emergent condition) 0 X - New Admissions / Biomedical engineer / Ordering NPWT, Apligraf, etc. 1 15 _6  - Emergency Hospital Admission (emergent condition) 0 PROCESS - Special Needs _7  - Pediatric / Minor Patient Management 0 _8  - Isolation Patient Management 0 Natasha [name], Natasha [name] ([id number]) _9  - Hearing / Language / Visual special needs 0 _10  - Assessment of Community assistance (transportation, D/C planning, etc.) 0 _11  - Additional assistance / Altered mentation 0 _12  - Support Surface(s) Assessment (bed, cushion, seat, etc.) 0 INTERVENTIONS -  Miscellaneous _13  - External ear exam 0 X - Patient Transfer (multiple staff / Civil Service fast streamer / Similar devices) 1 10 _14  - Simple Staple / Suture removal (25 or less) 0 _15  - Complex Staple / Suture removal (26 or more) 0 _16  - Hypo/Hyperglycemic Management (do not check if billed separately) 0 _17  - Ankle / Brachial Index (ABI) - do not check if billed separately 0 Has  the patient been seen at the hospital within the last three years: Yes Total Score: 110 Level Of Care: New/Established - Level 3 Electronic Signature(s) Signed: 01/19/2016 3:33:30 PM By: Alric Quan Entered By: Alric Quan on 01/19/2016 14:51:57 Natasha [name] ([id number]) -------------------------------------------------------------------------------- Encounter Discharge Information Details Patient Name: Natasha [name] Date of Service: 01/19/2016 1:15 PM Medical Record Number: [id number] Patient Account Number: [id number] Date of Birth/Sex: [birth year] (84 y.o. Female) Treating RN: Ahmed Prima Primary Care Physician: Emily Filbert Other Clinician: Referring Physician: Maura Crandall Treating Physician/Extender: Frann Rider in Treatment: 0 Encounter Discharge Information Items Discharge Pain Level: 0 Discharge Condition: Stable Ambulatory Status: Wheelchair Discharge Destination: Home Transportation: Private Auto Accompanied By: husband Schedule Follow-up Appointment: Yes Medication Reconciliation completed No and provided to Patient/Care Nochum Fenter: Provided on Clinical Summary of Care: 01/19/2016 Form Type Recipient Paper Patient [facility] Electronic Signature(s) Signed: 01/19/2016 3:33:30 PM By: Alric Quan Previous Signature: 01/19/2016 2:43:02 PM Version By: Ruthine Dose Entered By: Alric Quan on 01/19/2016 14:55:22 Natasha [name] ([id number]) -------------------------------------------------------------------------------- Lower Extremity Assessment Details Patient Name:  Natasha [name] Date of Service: 01/19/2016 1:15 PM Medical Record Number: [id number] Patient Account Number: [id number] Date of Birth/Sex: January 10, 1929 (83 y.o. Female) Treating RN: Ahmed Prima Primary Care Physician: Emily Filbert Other Clinician: Referring Physician: Maura Crandall Treating Physician/Extender: Frann Rider in Treatment: 0 Edema Assessment Assessed: [Left: No] [Right: No] E[Left: dema] [Right: :] Calf Left: Right: Point of Measurement: 28 cm From Medial Instep 32.5 cm cm Ankle Left: Right: Point of Measurement: 10 cm From Medial Instep 24 cm cm Vascular Assessment Pulses: Dorsalis Pedis Palpable: [Left:Yes] Posterior Tibial Extremity colors, hair growth, and conditions: Extremity Color: [Left:Mottled] Temperature of Extremity: [Left:Warm] Capillary Refill: [Left:< 3 seconds] Toe Nail Assessment Left: Right: Thick: Yes Discolored: Yes Deformed: No Improper Length and Hygiene: No Electronic Signature(s) Signed: 01/19/2016 3:33:30 PM By: Alric Quan Entered By: Alric Quan on 01/19/2016 14:12:49 Natasha [name] ([id number]) -------------------------------------------------------------------------------- Multi Wound Chart Details Patient Name: Natasha [name] Date of Service: 01/19/2016 1:15 PM Medical Record Number: [id number] Patient Account Number: [id number] Date of Birth/Sex: 09/01/1928 (83 y.o. Female) Treating RN: Ahmed Prima Primary Care Physician: Emily Filbert Other Clinician: Referring Physician: Maura Crandall Treating Physician/Extender: Frann Rider in Treatment: 0 Vital Signs Height(in): 59 Pulse(bpm): 82 Weight(lbs): 140 Blood Pressure 155/58 (mmHg): Body Mass Index(BMI): 28 Temperature(F): 97.7 Respiratory Rate 16 (breaths/min): Photos: [1:No Photos] [2:No Photos] [3:No Photos] Wound Location: [1:Left Upper Leg - Medial Left Lower Leg - Proximal Left Lower Leg - Distal] Wounding  Event: [1:Surgical Injury] [2:Surgical Injury] [3:Surgical Injury] Primary Etiology: [1:Open Surgical Wound] [2:Open Surgical Wound] [3:Open Surgical Wound] Comorbid History: [1:Anemia, Asthma, Hypertension, Peripheral Hypertension, Peripheral Hypertension, Peripheral Venous Disease, Osteoarthritis, Neuropathy Osteoarthritis, Neuropathy Osteoarthritis, Neuropathy] [2:Anemia, Asthma, Venous Disease,]  [3:Anemia, Asthma, Venous Disease,] Date Acquired: [1:12/04/2015] [2:12/04/2015] [3:12/04/2015] Weeks of Treatment: [1:0] [2:0] [3:0] Wound Status: [1:Open] [2:Open] [3:Open] Measurements L x W x D 2.4x0.5x1.4 [2:1.2x0.6x0.9] [3:7.5x3x0.7] (cm) Area (cm) : [1:0.942] [2:0.565] [3:17.671] Volume (cm) : [1:1.319] [2:0.509] [3:12.37] Classification: [1:Full Thickness With Exposed Support Structures] [2:Full Thickness With Exposed Support Structures] [3:Full Thickness With Exposed Support Structures] Exudate Amount: [1:Large] [2:Large] [3:Large] Exudate Type: [1:Serosanguineous] [2:Serosanguineous] [3:Serosanguineous] Exudate Color: [1:red, brown] [2:red, brown] [3:red, brown] Wound Margin: [1:Distinct, outline attached Distinct, outline attached Distinct, outline attached] Granulation Amount: [1:Large (67-100%)] [2:Large (67-100%)] [3:Large (67-100%)] Granulation Quality: [1:Red, Pink] [2:Red, Pink] [3:Red, Pink] Necrotic Amount: [1:Small (1-33%)] [2:Small (1-33%)] [3:Small (1-33%)] Exposed Structures: [1:Fascia: Yes Fat: Yes Muscle: Yes] [2:Fascia: Yes Fat:  Yes] [3:Fascia: Yes Fat: Yes Muscle: Yes] Epithelialization: [1:None] [2:None] [3:None] Debridement: [1:N/A] [2:N/A] Debridement ([id number]) Pre-procedure N/A N/A 14:23 Verification/Time Out Taken: Pain Control: N/A N/A Lidocaine 4% Topical Solution Tissue Debrided: N/A N/A Fibrin/Slough, Exudates, Subcutaneous Level: N/A N/A Skin/Subcutaneous Tissue Debridement Area (sq N/A N/A 22.5 cm): Instrument: N/A N/A Curette Bleeding:  N/A N/A Minimum Hemostasis Achieved: N/A N/A Pressure Procedural Pain: N/A N/A 0 Post Procedural Pain: N/A N/A 0 Debridement Treatment N/A N/A Procedure was tolerated Response: well Post Debridement N/A N/A 7.5x3x0.7 Measurements L x W x D (cm) Post Debridement N/A N/A 12.37 Volume: (cm) Periwound Skin Texture: Edema: Yes Edema: Yes Edema: Yes Periwound Skin Moist: Yes No Abnormalities Noted Moist: Yes Moisture: Periwound Skin Color: Erythema: Yes Erythema: Yes Erythema: Yes Erythema Location: Circumferential Circumferential Circumferential Temperature: No Abnormality No Abnormality No Abnormality Tenderness on Yes Yes Yes Palpation: Wound Preparation: Ulcer Cleansing: Ulcer Cleansing: Ulcer Cleansing: Rinsed/Irrigated with Rinsed/Irrigated with Rinsed/Irrigated with Saline Saline Saline Topical Anesthetic Topical Anesthetic Topical Anesthetic Applied: Other: lidocaine Applied: Other: lidocaine Applied: Other: lidocaine 4% 4% 4% Procedures Performed: N/A N/A Debridement Treatment Notes Electronic Signature(s) Signed: 01/19/2016 2:27:51 PM By: Christin Fudge MD, FACS Previous Signature: 01/19/2016 1:10:28 PM Version By: Christin Fudge MD, FACS Entered By: Christin Fudge on 01/19/2016 14:27:51 Natasha [name], Natasha [name] ([id number]) Natasha [name], Natasha [name] ([id number]) -------------------------------------------------------------------------------- Multi-Disciplinary Care Plan Details Patient Name: Natasha [name] Date of Service: 01/19/2016 1:15 PM Medical Record Number: [id number] Patient Account Number: [id number] Date of Birth/Sex: 05-10-1928 (83 y.o. Female) Treating RN: Ahmed Prima Primary Care Physician: Emily Filbert Other Clinician: Referring Physician: Maura Crandall Treating Physician/Extender: Frann Rider in Treatment: 0 Active Inactive Abuse / Safety / Falls / Self Care Management Nursing Diagnoses: Potential for falls Goals: Patient will remain injury  free Date Initiated: 01/19/2016 Goal Status: Active Interventions: Assess fall risk on admission and as needed Assess: immobility, friction, shearing, incontinence upon admission and as needed Assess impairment of mobility on admission and as needed per policy Notes: Nutrition Nursing Diagnoses: Imbalanced nutrition Goals: Patient/caregiver agrees to and verbalizes understanding of need to use nutritional supplements and/or vitamins as prescribed Date Initiated: 01/19/2016 Goal Status: Active Interventions: Assess patient nutrition upon admission and as needed per policy Notes: Orientation to the Wound Care Program Nursing Diagnoses: Knowledge deficit related to the wound healing center program JASLYNE, BEECK ([id number]) Goals: Patient/caregiver will verbalize understanding of the [place] Date Initiated: 01/19/2016 Goal Status: Active Interventions: Provide education on orientation to the wound center Notes: Pain, Acute or Chronic Nursing Diagnoses: Pain, acute or chronic: actual or potential Potential alteration in comfort, pain Goals: Patient will verbalize adequate pain control and receive pain control interventions during procedures as needed Date Initiated: 01/19/2016 Goal Status: Active Patient/caregiver will verbalize adequate pain control between visits Date Initiated: 01/19/2016 Goal Status: Active Patient/caregiver will verbalize comfort level met Date Initiated: 01/19/2016 Goal Status: Active Interventions: Assess comfort goal upon admission Complete pain assessment as per visit requirements Notes: Wound/Skin Impairment Nursing Diagnoses: Impaired tissue integrity Knowledge deficit related to ulceration/compromised skin integrity Goals: Ulcer/skin breakdown will have a volume reduction of 30% by week 4 Date Initiated: 01/19/2016 Goal Status: Active Ulcer/skin breakdown will have a volume reduction of 50% by week 8 Date Initiated:  01/19/2016 Natasha [name], Natasha [name] ([id number]) Goal Status: Active Ulcer/skin breakdown will have a volume reduction of 80% by week 12 Date Initiated: 01/19/2016 Goal Status: Active Interventions: Assess patient/caregiver ability to perform ulcer/skin care regimen upon admission and as needed Assess  ulceration(s) every visit Notes: Electronic Signature(s) Signed: 01/19/2016 3:33:30 PM By: Alric Quan Entered By: Alric Quan on 01/19/2016 14:20:59 Natasha [name] ([id number]) -------------------------------------------------------------------------------- Pain Assessment Details Patient Name: Natasha [name] Date of Service: 01/19/2016 1:15 PM Medical Record Number: [id number] Patient Account Number: [id number] Date of Birth/Sex: Nov 10, 1928 (83 y.o. Female) Treating RN: Ahmed Prima Primary Care Physician: Emily Filbert Other Clinician: Referring Physician: Maura Crandall Treating Physician/Extender: Frann Rider in Treatment: 0 Active Problems Location of Pain Severity and Description of Pain Patient Has Paino No Site Locations With Dressing Change: No Pain Management and Medication Current Pain Management: Electronic Signature(s) Signed: 01/19/2016 3:33:30 PM By: Alric Quan Entered By: Alric Quan on 01/19/2016 13:47:32 Natasha [name] ([id number]) -------------------------------------------------------------------------------- Patient/Caregiver Education Details Patient Name: Natasha [name] Date of Service: 01/19/2016 1:15 PM Medical Record Number: [id number] Patient Account Number: [id number] Date of Birth/Gender: Oct 29, 1928 (83 y.o. Female) Treating RN: Ahmed Prima Primary Care Physician: Emily Filbert Other Clinician: Referring Physician: Maura Crandall Treating Physician/Extender: Frann Rider in Treatment: 0 Education Assessment Education Provided To: Patient Education Topics Provided Wound/Skin  Impairment: Handouts: Other: change dressing as ordered Methods: Demonstration, Explain/Verbal Responses: State content correctly Electronic Signature(s) Signed: 01/19/2016 3:33:30 PM By: Alric Quan Entered By: Alric Quan on 01/19/2016 14:55:43 Natasha [name] ([id number]) -------------------------------------------------------------------------------- Wound Assessment Details Patient Name: Natasha [name] Date of Service: 01/19/2016 1:15 PM Medical Record Number: [id number] Patient Account Number: [id number] Date of Birth/Sex: July 15, 1928 (83 y.o. Female) Treating RN: Carolyne Fiscal, Debi Primary Care Physician: Emily Filbert Other Clinician: Referring Physician: Maura Crandall Treating Physician/Extender: Frann Rider in Treatment: 0 Wound Status Wound Number: 1 Primary Open Surgical Wound Etiology: Wound Location: Left Upper Leg - Medial Wound Open Wounding Event: Surgical Injury Status: Date Acquired: 12/04/2015 Comorbid Anemia, Asthma, Hypertension, Weeks Of Treatment: 0 History: Peripheral Venous Disease, Clustered Wound: No Osteoarthritis, Neuropathy Photos Photo Uploaded By: Alric Quan on 01/19/2016 15:15:50 Wound Measurements Length: (cm) 2.4 Width: (cm) 0.5 Depth: (cm) 1.4 Area: (cm) 0.942 Volume: (cm) 1.319 % Reduction in Area: % Reduction in Volume: Epithelialization: None Tunneling: No Undermining: No Wound Description Full Thickness With Exposed Classification: Support Structures Wound Margin: Distinct, outline attached Exudate Large Amount: Exudate Type: Serosanguineous Exudate Color: red, brown Foul Odor After Cleansing: No Wound Bed Granulation Amount: Large (67-100%) Exposed Structure Granulation Quality: Red, Pink Fascia Exposed: Yes Natasha [name], Natasha [name] ([id number]) Necrotic Amount: Small (1-33%) Fat Layer Exposed: Yes Necrotic Quality: Adherent Slough Muscle Exposed: Yes Necrosis of Muscle:  No Periwound Skin Texture Texture Color No Abnormalities Noted: No No Abnormalities Noted: No Localized Edema: Yes Erythema: Yes Erythema Location: Circumferential Moisture No Abnormalities Noted: No Temperature / Pain Moist: Yes Temperature: No Abnormality Tenderness on Palpation: Yes Wound Preparation Ulcer Cleansing: Rinsed/Irrigated with Saline Topical Anesthetic Applied: Other: lidocaine 4%, Treatment Notes Wound #1 (Left, Medial Upper Leg) 1. Cleansed with: Clean wound with Normal Saline 2. Anesthetic Topical Lidocaine 4% cream to wound bed prior to debridement 4. Dressing Applied: Aquacel Ag 5. Secondary Dressing Applied Bordered Foam Dressing Electronic Signature(s) Signed: 01/19/2016 3:33:30 PM By: Alric Quan Entered By: Alric Quan on 01/19/2016 14:15:39 Natasha [name] ([id number]) -------------------------------------------------------------------------------- Wound Assessment Details Patient Name: Natasha [name] Date of Service: 01/19/2016 1:15 PM Medical Record Number: [id number] Patient Account Number: [id number] Date of Birth/Sex: [birth year] (83 y.o. Female) Treating RN: Ahmed Prima Primary Care Physician: Emily Filbert Other Clinician: Referring Physician: Maura Crandall Treating Physician/Extender: Frann Rider in Treatment: 0 Wound Status Wound Number: 2 Primary Open Surgical Wound  Etiology: Wound Location: Left Lower Leg - Proximal Wound Open Wounding Event: Surgical Injury Status: Date Acquired: 12/04/2015 Comorbid Anemia, Asthma, Hypertension, Weeks Of Treatment: 0 History: Peripheral Venous Disease, Clustered Wound: No Osteoarthritis, Neuropathy Photos Photo Uploaded By: Alric Quan on 01/19/2016 15:15:51 Wound Measurements Length: (cm) 1.2 Width: (cm) 0.6 Depth: (cm) 0.9 Area: (cm) 0.565 Volume: (cm) 0.509 % Reduction in Area: % Reduction in Volume: Epithelialization: None Tunneling:  No Undermining: No Wound Description Full Thickness With Exposed Classification: Support Structures Wound Margin: Distinct, outline attached Exudate Large Amount: Exudate Type: Serosanguineous Exudate Color: red, brown Foul Odor After Cleansing: No Wound Bed Granulation Amount: Large (67-100%) Exposed Structure Granulation Quality: Red, Pink Fascia Exposed: Yes Natasha [name], Natasha [name] ([id number]) Necrotic Amount: Small (1-33%) Fat Layer Exposed: Yes Necrotic Quality: Adherent Slough Periwound Skin Texture Texture Color No Abnormalities Noted: No No Abnormalities Noted: No Localized Edema: Yes Erythema: Yes Erythema Location: Circumferential Moisture No Abnormalities Noted: No Temperature / Pain Temperature: No Abnormality Tenderness on Palpation: Yes Wound Preparation Ulcer Cleansing: Rinsed/Irrigated with Saline Topical Anesthetic Applied: Other: lidocaine 4%, Treatment Notes Wound #2 (Left, Proximal Lower Leg) 1. Cleansed with: Clean wound with Normal Saline 2. Anesthetic Topical Lidocaine 4% cream to wound bed prior to debridement 4. Dressing Applied: Aquacel Ag 5. Secondary Dressing Applied ABD and Kerlix/Conform 7. Secured with Tape Notes netting Electronic Signature(s) Signed: 01/19/2016 3:33:30 PM By: Alric Quan Entered By: Alric Quan on 01/19/2016 14:17:15 Natasha [name] ([id number]) -------------------------------------------------------------------------------- Wound Assessment Details Patient Name: Natasha [name] Date of Service: 01/19/2016 1:15 PM Medical Record Number: [id number] Patient Account Number: [id number] Date of Birth/Sex: July 29, 1928 (83 y.o. Female) Treating RN: Carolyne Fiscal, Debi Primary Care Physician: Emily Filbert Other Clinician: Referring Physician: Maura Crandall Treating Physician/Extender: Frann Rider in Treatment: 0 Wound Status Wound Number: 3 Primary Open Surgical Wound Etiology: Wound  Location: Left Lower Leg - Distal Wound Open Wounding Event: Surgical Injury Status: Date Acquired: 12/04/2015 Comorbid Anemia, Asthma, Hypertension, Weeks Of Treatment: 0 History: Peripheral Venous Disease, Clustered Wound: No Osteoarthritis, Neuropathy Photos Photo Uploaded By: Alric Quan on 01/19/2016 15:16:11 Wound Measurements Length: (cm) 7.5 Width: (cm) 3 Depth: (cm) 0.7 Area: (cm) 17.671 Volume: (cm) 12.37 % Reduction in Area: % Reduction in Volume: Epithelialization: None Tunneling: No Undermining: No Wound Description Full Thickness With Exposed Support Classification: Structures Wound Margin: Distinct, outline attached Exudate Large Amount: Exudate Type: Serosanguineous Exudate Color: red, brown Wound Bed Granulation Amount: Large (67-100%) Exposed Structure Granulation Quality: Red, Pink Fascia Exposed: Yes Natasha [name], Natasha [name] ([id number]) Necrotic Amount: Small (1-33%) Fat Layer Exposed: Yes Necrotic Quality: Adherent Slough Muscle Exposed: Yes Necrosis of Muscle: No Periwound Skin Texture Texture Color No Abnormalities Noted: No No Abnormalities Noted: No Localized Edema: Yes Erythema: Yes Erythema Location: Circumferential Moisture No Abnormalities Noted: No Temperature / Pain Moist: Yes Temperature: No Abnormality Tenderness on Palpation: Yes Wound Preparation Ulcer Cleansing: Rinsed/Irrigated with Saline Topical Anesthetic Applied: Other: lidocaine 4%, Treatment Notes Wound #3 (Left, Distal Lower Leg) 1. Cleansed with: Clean wound with Normal Saline 2. Anesthetic Topical Lidocaine 4% cream to wound bed prior to debridement 4. Dressing Applied: Aquacel Ag 5. Secondary Dressing Applied ABD and Kerlix/Conform 7. Secured with Tape Notes netting Electronic Signature(s) Signed: 01/19/2016 3:33:30 PM By: Alric Quan Entered By: Alric Quan on 01/19/2016 14:18:27 Natasha [name]  ([id number]) -------------------------------------------------------------------------------- Vitals Details Patient Name: Natasha [name] Date of Service: 01/19/2016 1:15 PM Medical Record Number: [id number] Patient Account Number: [id number] Date of Birth/Sex: Dec 12, 1928 (83 y.o. Female) Treating RN:  Carolyne Fiscal, Debi Primary Care Physician: Emily Filbert Other Clinician: Referring Physician: Maura Crandall Treating Physician/Extender: Frann Rider in Treatment: 0 Vital Signs Time Taken: 13:47 Temperature (F): 97.7 Height (in): 59 Pulse (bpm): 82 Source: Stated Respiratory Rate (breaths/min): 16 Weight (lbs): 140 Blood Pressure (mmHg): 155/58 Source: Measured Reference Range: 80 - 120 mg / dl Body Mass Index (BMI): 28.3 Electronic Signature(s) Signed: 01/19/2016 3:33:30 PM By: Alric Quan Entered By: Alric Quan on 01/19/2016 13:48:02

## 2016-01-19 NOTE — ED Notes (Signed)
Natasha [name] at bedside with update.

## 2016-01-19 NOTE — Progress Notes (Signed)
NELEH, MULDOON ([id number]) Visit Report for 01/19/2016 Abuse/Suicide Risk Screen Details Patient Name: Natasha, [name] Date of Service: 01/19/2016 1:15 PM Medical Record Number: [id number] Patient Account Number: [id number] Date of Birth/Sex: 04-09-1928 (83 y.o. Female) Treating RN: Ahmed Prima Primary Care Physician: Emily Filbert Other Clinician: Referring Physician: Maura Crandall Treating Physician/Extender: Frann Rider in Treatment: 0 Abuse/Suicide Risk Screen Items Answer ABUSE/SUICIDE RISK SCREEN: Has anyone close to you tried to hurt or harm you recentlyo No Do you feel uncomfortable with anyone in your familyo No Has anyone forced you do things that you didnot want to doo No Do you have any thoughts of harming yourselfo No Patient displays signs or symptoms of abuse and/or neglect. No Electronic Signature(s) Signed: 01/19/2016 3:33:30 PM By: Alric Quan Entered By: Alric Quan on 01/19/2016 13:59:28 Natasha [name] ([id number]) -------------------------------------------------------------------------------- Activities of Daily Living Details Patient Name: Natasha [name] Date of Service: 01/19/2016 1:15 PM Medical Record Number: [id number] Patient Account Number: [id number] Date of Birth/Sex: [birth year] (83 y.o. Female) Treating RN: Ahmed Prima Primary Care Physician: Emily Filbert Other Clinician: Referring Physician: Maura Crandall Treating Physician/Extender: Frann Rider in Treatment: 0 Activities of Daily Living Items Answer Activities of Daily Living (Please select one for each item) Drive Automobile Completely Able Take Medications Completely Able Use Telephone Completely Able Care for Appearance Completely Able Use Toilet Completely Able Bath / Shower Need Assistance Dress Self Completely Able Feed Self Completely Able Walk Need Assistance Get In / Out Bed Need Assistance Housework Not Able Prepare Meals  Need Assistance Handle Money Need Assistance Shop for Self Need Assistance Electronic Signature(s) Signed: 01/19/2016 3:33:30 PM By: Alric Quan Entered By: Alric Quan on 01/19/2016 14:00:59 Natasha [name] ([id number]) -------------------------------------------------------------------------------- Education Assessment Details Patient Name: Natasha [name] Date of Service: 01/19/2016 1:15 PM Medical Record Number: [id number] Patient Account Number: [id number] Date of Birth/Sex: 13-Nov-1928 (83 y.o. Female) Treating RN: Ahmed Prima Primary Care Physician: Emily Filbert Other Clinician: Referring Physician: Maura Crandall Treating Physician/Extender: Frann Rider in Treatment: 0 Primary Learner Assessed: Patient Learning Preferences/Education Level/Primary Language Learning Preference: Explanation, Printed Material Highest Education Level: High School Preferred Language: English Cognitive Barrier Assessment/Beliefs Language Barrier: No Translator Needed: No Memory Deficit: No Emotional Barrier: No Cultural/Religious Beliefs Affecting Medical No Care: Physical Barrier Assessment Impaired Vision: Yes Glasses Impaired Hearing: Yes conchlear implant Knowledge/Comprehension Assessment Knowledge Level: High Comprehension Level: High Ability to understand written High instructions: Ability to understand verbal High instructions: Motivation Assessment Anxiety Level: Calm Cooperation: Cooperative Education Importance: Acknowledges Need Interest in Health Problems: Asks Questions Perception: Coherent Willingness to Engage in Self- High Management Activities: Readiness to Engage in Self- High Management Activities: Electronic Signature(s) Signed: 01/19/2016 3:33:30 PM By: Colon Branch ([id number]) Entered By: Alric Quan on 01/19/2016 14:01:41 Natasha [name]  ([id number]) -------------------------------------------------------------------------------- Fall Risk Assessment Details Patient Name: Natasha [name] Date of Service: 01/19/2016 1:15 PM Medical Record Number: [id number] Patient Account Number: [id number] Date of Birth/Sex: August 07, 1928 (83 y.o. Female) Treating RN: Carolyne Fiscal, Debi Primary Care Physician: Emily Filbert Other Clinician: Referring Physician: Maura Crandall Treating Physician/Extender: Frann Rider in Treatment: 0 Fall Risk Assessment Items Have you had 2 or more falls in the last 12 monthso 0 No Have you had any fall that resulted in injury in the last 12 monthso 0 No FALL RISK ASSESSMENT: History of falling - immediate or within 3 months 0 No Secondary diagnosis 15 Yes Ambulatory aid None/bed rest/wheelchair/nurse 0 Yes Crutches/cane/walker 15 Yes Furniture 0  No IV Access/Saline Lock 0 No Gait/Training Normal/bed rest/immobile 0 No Weak 10 Yes Impaired 20 Yes Mental Status Oriented to own ability 0 Yes Electronic Signature(s) Signed: 01/19/2016 3:33:30 PM By: Alric Quan Entered By: Alric Quan on 01/19/2016 14:02:31 Natasha [name] ([id number]) -------------------------------------------------------------------------------- Foot Assessment Details Patient Name: Natasha [name] Date of Service: 01/19/2016 1:15 PM Medical Record Number: [id number] Patient Account Number: [id number] Date of Birth/Sex: [birth year] (83 y.o. Female) Treating RN: Ahmed Prima Primary Care Physician: Emily Filbert Other Clinician: Referring Physician: Maura Crandall Treating Physician/Extender: Frann Rider in Treatment: 0 Foot Assessment Items Site Locations + = Sensation present, - = Sensation absent, C = Callus, U = Ulcer R = Redness, W = Warmth, M = Maceration, PU = Pre-ulcerative lesion F = Fissure, S = Swelling, D = Dryness Assessment Right: Left: Other Deformity: No No Prior Foot  Ulcer: No No Prior Amputation: No No Charcot Joint: No No Ambulatory Status: Ambulatory With Help Assistance Device: Walker Gait: Steady Electronic Signature(s) Signed: 01/19/2016 3:33:30 PM By: Alric Quan Entered By: Alric Quan on 01/19/2016 14:05:48 Natasha [name] ([id number]) -------------------------------------------------------------------------------- Nutrition Risk Assessment Details Patient Name: Natasha [name] Date of Service: 01/19/2016 1:15 PM Medical Record Number: [id number] Patient Account Number: [id number] Date of Birth/Sex: Jul 04, 1928 (83 y.o. Female) Treating RN: Ahmed Prima Primary Care Physician: Emily Filbert Other Clinician: Referring Physician: Maura Crandall Treating Physician/Extender: Frann Rider in Treatment: 0 Height (in): 59 Weight (lbs): 140 Body Mass Index (BMI): 28.3 Nutrition Risk Assessment Items NUTRITION RISK SCREEN: I have an illness or condition that made me change the kind and/or 2 Yes amount of food I eat I eat fewer than two meals per day 3 Yes I eat few fruits and vegetables, or milk products 0 No I have three or more drinks of beer, liquor or wine almost every day 0 No I have tooth or mouth problems that make it hard for me to eat 0 No I don't always have enough money to buy the food I need 0 No I eat alone most of the time 0 No I take three or more different prescribed or over-the-counter drugs a 1 Yes day Without wanting to, I have lost or gained 10 pounds in the last six 2 Yes months I am not always physically able to shop, cook and/or feed myself 2 Yes Nutrition Protocols Good Risk Protocol Moderate Risk Protocol Electronic Signature(s) Signed: 01/19/2016 3:33:30 PM By: Alric Quan Entered By: Alric Quan on 01/19/2016 14:03:08

## 2016-01-19 NOTE — Progress Notes (Addendum)
ELANY, FELIX ([id number]) Visit Report for 01/19/2016 Chief Complaint Document Details Patient Name: Natasha [name], MCGLADE Date of Service: 01/19/2016 1:15 PM Medical Record Number: [id number] Patient Account Number: [id number] Date of Birth/Sex: 1928-12-18 (83 y.o. Female) Treating RN: Ahmed Prima Primary Care Physician: Emily Filbert Other Clinician: Referring Physician: Maura Crandall Treating Physician/Extender: Frann Rider in Treatment: 0 Information Obtained from: Patient Chief Complaint Patient presents to the wound care center today with an open arterial ulcer along with diabetes mellitus to the left lower extremity which she's had for over a month Electronic Signature(s) Signed: 01/19/2016 2:28:23 PM By: Christin Fudge MD, FACS Previous Signature: 01/19/2016 1:10:52 PM Version By: Christin Fudge MD, FACS Entered By: Christin Fudge on 01/19/2016 14:28:22 Natasha [name] ([id number]) -------------------------------------------------------------------------------- Debridement Details Patient Name: Natasha [name] Date of Service: 01/19/2016 1:15 PM Medical Record Number: [id number] Patient Account Number: [id number] Date of Birth/Sex: 1928-12-10 (83 y.o. Female) Treating RN: Ahmed Prima Primary Care Physician: Emily Filbert Other Clinician: Referring Physician: Maura Crandall Treating Physician/Extender: Frann Rider in Treatment: 0 Debridement Performed for Wound #3 Left,Distal Lower Leg Assessment: Performed By: Physician Christin Fudge, MD Debridement: Debridement Pre-procedure Yes - 14:23 Verification/Time Out Taken: Start Time: 14:24 Pain Control: Lidocaine 4% Topical Solution Level: Skin/Subcutaneous Tissue Total Area Debrided (L x 5 (cm) x 3 (cm) = 15 (cm) W): Tissue and other Viable, Non-Viable, Exudate, Fibrin/Slough, Subcutaneous material debrided: Instrument: Curette Bleeding: Minimum Hemostasis Achieved: Pressure End Time:  14:26 Procedural Pain: 0 Post Procedural Pain: 0 Response to Treatment: Procedure was tolerated well Post Debridement Measurements of Total Wound Length: (cm) 7.5 Width: (cm) 3 Depth: (cm) 0.7 Volume: (cm) 12.37 Character of Wound/Ulcer Post Requires Further Debridement Debridement: Severity of Tissue Post Debridement: Fat layer exposed Post Procedure Diagnosis Same as Pre-procedure Electronic Signature(s) Signed: 01/19/2016 2:28:07 PM By: Christin Fudge MD, FACS Signed: 01/19/2016 3:33:30 PM By: Alric Quan Entered By: Christin Fudge on 01/19/2016 14:28:06 Natasha [name] ([id number], RIEMENSCHNEIDER ([id number]) -------------------------------------------------------------------------------- HPI Details Patient Name: Natasha [name] Date of Service: 01/19/2016 1:15 PM Medical Record Number: [id number] Patient Account Number: [id number] Date of Birth/Sex: Jul 31, 1928 (83 y.o. Female) Treating RN: Ahmed Prima Primary Care Physician: Emily Filbert Other Clinician: Referring Physician: Maura Crandall Treating Physician/Extender: Frann Rider in Treatment: 0 History of Present Illness Location: left thigh and left calf in the area where there was previous vascular surgery Quality: Patient reports experiencing a dull pain to affected area(s). Severity: Patient states wound (s) are getting better. Duration: Patient has had the wound for > 2 months prior to seeking treatment at the wound center Timing: Pain in wound is Intermittent (comes and goes Context: The wound occurred when the patient had vascular surgery and the wounds were not healing very well Modifying Factors: Other treatment(s) tried include:local care and oral antibiotics Associated Signs and Symptoms: Patient reports having increase swelling. HPI Description: 83 year old female with significant past medical history of anemia, cellulitis and abscess of the leg, COPD, coronary artery disease,  hypertension, osteoarthritis, peripheral vascular disease with claudication, trigeminal neuralgia and type 2 diabetes mellitus without complications. A past surgical history significant for appendectomy, left femoral o peroneal bypass graft in October 2017, and debridement of skin and subcutis tissue on the left side in December 2017. She was treated by Dr. Lucky Cowboy earlier, with multiple left leg revascularization both endovascular and open. She is a former smoker who quit 30 years ago. When she was last seen at [facility], at the vascular surgery clinic she had  a left lower extremity 3 separate wounds along the saphenectomy site. She was placed on Bactrim DS for 10 days. She had a good signal over the peroneal artery and the bypass remains patent. Prior to surgery her left ABI was 0.46 and a right was 0.98. Note the patient was seen by Dr. Amalia Hailey of podiatry on 01/15/2016 and he saw for ulceration of her left great toe. Most recently the patient was seen by Dr. Maura Crandall, the vascular surgeon on January 5 and these notes are reviewed on Epic-- he noted that the 3 wounds on the left lower extremity are granulation very slowly, and he recommended packing with wet-to-dry Kerlix. The surgeon noted excellent signal over the peroneal artery and the recommendation was to see as at the wound center to help with healing Electronic Signature(s) Signed: 01/19/2016 2:29:24 PM By: Christin Fudge MD, FACS Previous Signature: 01/19/2016 1:28:07 PM Version By: Christin Fudge MD, FACS Previous Signature: 01/19/2016 1:12:37 PM Version By: Christin Fudge MD, FACS Entered By: Christin Fudge on 01/19/2016 14:29:23 Natasha [name] ([id number]) -------------------------------------------------------------------------------- Physical Exam Details Patient Name: Natasha [name] Date of Service: 01/19/2016 1:15 PM Medical Record Number: [id number] Patient Account Number: [id number] Date of Birth/Sex: 22-Feb-1928 (83 y.o.  Female) Treating RN: Ahmed Prima Primary Care Physician: Emily Filbert Other Clinician: Referring Physician: Maura Crandall Treating Physician/Extender: Frann Rider in Treatment: 0 Constitutional . Pulse regular. Respirations normal and unlabored. Afebrile. . Eyes Nonicteric. Reactive to light. Ears, Nose, Mouth, and Throat Lips, teeth, and gums WNL.Marland Kitchen Moist mucosa without lesions. Neck supple and nontender. No palpable supraclavicular or cervical adenopathy. Normal sized without goiter. Respiratory WNL. No retractions.. Breath sounds WNL, No rubs, rales, rhonchi, or wheeze.. Cardiovascular BN not measured due to her wounds but recent notes from [place] noted. she has some swelling of her lower extremities. Gastrointestinal (GI) Abdomen without masses or tenderness.. No liver or spleen enlargement or tenderness.. Lymphatic No adneopathy. No adenopathy. No adenopathy. Musculoskeletal Adexa without tenderness or enlargement.. Digits and nails w/o clubbing, cyanosis, infection, petechiae, ischemia, or inflammatory conditions.. Integumentary (Hair, Skin) No suspicious lesions. No crepitus or fluctuance. No peri-wound warmth or erythema. No masses.Marland Kitchen Psychiatric Judgement and insight Intact.. No evidence of depression, anxiety, or agitation.. Notes the patient has linear ulcerations the largest being on her medial calf and this has some slough which was sharply debrided with a #3 curet and bleeding controlled with pressure. The other wounds along her left medial thigh and left upper calf are healing and have healthy granulation tissue Electronic Signature(s) Signed: 01/19/2016 2:31:21 PM By: Christin Fudge MD, FACS Entered By: Christin Fudge on 01/19/2016 14:31:20 Natasha [name] ([id number]) -------------------------------------------------------------------------------- Physician Orders Details Patient Name: Natasha [name] Date of Service: 01/19/2016 1:15  PM Medical Record Number: [id number] Patient Account Number: [id number] Date of Birth/Sex: 01/01/1929 (83 y.o. 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 o Cleanse wound with mild soap and water Wound #2 Left,Proximal Lower Leg o Clean wound with Normal Saline. o Cleanse wound with mild soap and water Wound #  3 Left,Distal Lower Leg o Clean wound with Normal Saline. o Cleanse wound with mild soap and water Anesthetic Wound #1 Left,Medial Upper Leg o Topical Lidocaine 4% cream applied to wound bed prior to debridement - for clinic use Wound #2 Left,Proximal Lower Leg o Topical Lidocaine 4% cream applied to wound bed prior to debridement - for clinic use Wound #3 Left,Distal Lower Leg o Topical Lidocaine 4% cream applied to wound bed prior to debridement - for clinic use Primary Wound Dressing Wound #1 Left,Medial Upper Leg o Aquacel Ag - pack light into wound Wound #2 Left,Proximal Lower Leg o Aquacel Ag - pack light into wound Wound #3 Left,Distal Lower Leg LASHE, OLIVEIRA ([id number]) o Aquacel Ag - pack light into wound Secondary Dressing Wound #1 Left,Medial Upper Leg o Boardered Foam Dressing Wound #2 Left,Proximal Lower Leg o ABD and Kerlix/Conform o Other - medipore tape Wound #3 Left,Distal Lower Leg o ABD and Kerlix/Conform o Other - medipore tape stretch net  #5 Dressing Change Frequency Wound #1 Left,Medial Upper Leg o Change dressing every other day. Wound #2 Left,Proximal Lower Leg o Change dressing every other day. Wound #3 Left,Distal Lower Leg o Change dressing every other day. Follow-up Appointments Wound #1 Left,Medial Upper Leg o Return Appointment in 1 week. Wound #2 Left,Proximal Lower Leg o Return Appointment in 1 week. Wound #3 Left,Distal Lower Leg o Return Appointment in 1 week. Edema Control Wound #1 Left,Medial Upper Leg o Elevate legs to the level of the heart and pump ankles as often as possible Wound #2 Left,Proximal Lower Leg o Elevate legs to the level of the heart and pump ankles as often as possible Wound #3 Left,Distal Lower Leg o Elevate legs to the level of the heart and pump ankles as often as possible Additional Orders / Instructions Wound #1 Left,Medial Upper Leg KARELLY, DEWALT ([id number]) o Increase protein intake. Wound #2 Left,Proximal Lower Leg o Increase protein intake. Wound #3 Left,Distal Lower Leg o Increase protein intake. Electronic Signature(s) Signed: 01/19/2016 3:33:30 PM By: Alric Quan Signed: 01/20/2016 8:20:37 AM By: Christin Fudge MD, FACS Previous Signature: 01/19/2016 2:35:57 PM Version By: Christin Fudge MD, FACS Entered By: Alric Quan on 01/19/2016 14:42:59 Natasha [name] ([id number]) -------------------------------------------------------------------------------- Problem List Details Patient Name: Natasha [name] Date of Service: 01/19/2016 1:15 PM Medical Record Number: [id number] Patient Account Number: [id number] Date of Birth/Sex: 1928/11/07 (83 y.o. Female) Treating RN: Ahmed Prima Primary Care Physician: Emily Filbert Other Clinician: Referring Physician: Maura Crandall Treating Physician/Extender: Frann Rider in Treatment: 0 Active Problems ICD-10 Encounter Code Description Active Date Diagnosis I70.242  Atherosclerosis 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ration 01/19/2016 Yes of calf L97.222 Non-pressure chronic ulcer of left calf with fat layer 01/19/2016 Yes exposed L97.122 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 01/19/2016 Yes exposed Inactive Problems Resolved Problems Electronic Signature(s) Signed: 01/19/2016 2:27:46 PM By: Christin Fudge MD, FACS Previous Signature: 01/19/2016 1:10:23 PM Version By: Christin Fudge MD, FACS Entered By: Christin Fudge on 01/19/2016 14:27:46 Natasha [name] ([id number]) -------------------------------------------------------------------------------- Progress Note Details Patient Name: Natasha [name] Date of Service: 01/19/2016 1:15 PM Medical Record Number: [id number] Patient Account Number: [id number] Date of Birth/Sex: [birth year] (83 y.o. Female) Treating RN: Ahmed Prima Primary Care Physician: Emily Filbert Other Clinician: Referring Physician: Maura Crandall Treating Physician/Extender: Frann Rider in Treatment: 0 Subjective Chief Complaint Information obtained from Patient Patient presents to the wound care center today with an open arterial ulcer along with diabetes mellitus to  the left lower extremity which she's had for over a month History of Present Illness (HPI) The following HPI elements were documented for the patient's wound: Location: left thigh and left calf in the area where there was previous vascular surgery Quality: Patient reports experiencing a dull pain to affected area(s). Severity: Patient states wound (s) are getting better. Duration: Patient has had the wound for > 2 months prior to seeking treatment at the wound center Timing: Pain in wound is Intermittent (comes and goes Context: The wound occurred when the patient had vascular surgery and the wounds were not healing very well Modifying Factors: Other treatment(s) tried include:local care and oral antibiotics Associated Signs and  Symptoms: Patient reports having increase swelling. 83 year old female with significant past medical history of anemia, cellulitis and abscess of the leg, COPD, coronary artery disease, hypertension, osteoarthritis, peripheral vascular disease with claudication, trigeminal neuralgia and type 2 diabetes mellitus without complications. A past surgical history significant for appendectomy, left femoral peroneal bypass graft in October 2017, and debridement of skin and subcutis tissue on the left side in December 2017. She was treated by Dr. Lucky Cowboy earlier, with multiple left leg revascularization both endovascular and open. She is a former smoker who quit 30 years ago. When she was last seen at [facility], at the vascular surgery clinic she had a left lower extremity 3 separate wounds along the saphenectomy site. She was placed on Bactrim DS for 10 days. She had a good signal over the peroneal artery and the bypass remains patent. Prior to surgery her left ABI was 0.46 and a right was 0.98. Note the patient was seen by Dr. Amalia Hailey of podiatry on 01/15/2016 and he saw for ulceration of her left great toe. Most recently the patient was seen by Dr. Maura Crandall, the vascular surgeon on January 5 and these notes are reviewed on Epic-- he noted that the 3 wounds on the left lower extremity are granulation very slowly, and he recommended packing with wet-to-dry Kerlix. The surgeon noted excellent signal over the peroneal artery and the recommendation was to see as at the wound center to help with healing Wound History Patient presents with 3 open wounds that have been present for approximately end of nov 2017. Patient IDALIA, ALLBRITTON ([id number]) has been treating wounds in the following manner: nurse packs gauze. Laboratory tests have not been performed in the last month. Patient reportedly has not tested positive for an antibiotic resistant organism. Patient reportedly has not tested positive for  osteomyelitis. Patient reportedly has had testing performed to evaluate circulation in the legs. Patient experiences the following problems associated with their wounds: swelling. Patient History Information obtained from Patient. Allergies codeine, hydrocodone, levofloxacin, tramadol Family History Cancer - Siblings, Child, Heart Disease - Mother, Father, No family history of Hereditary Spherocytosis, Hypertension, Kidney Disease, Lung Disease, Seizures, Stroke, Thyroid Problems, Tuberculosis. Social History Former smoker - quit 1987, Marital Status - Married, Alcohol Use - Never, Drug Use - No History, Caffeine Use - Never. Medical History Hematologic/Lymphatic Patient has history of Anemia Respiratory Patient has history of Asthma Cardiovascular Patient has history of Hypertension, Peripheral Venous Disease Musculoskeletal Patient has history of Osteoarthritis Neurologic Patient has history of Neuropathy Review of Systems (ROS) Constitutional Symptoms ([place]) The patient has no complaints or symptoms. Eyes Complains or has symptoms of Glasses / Contacts. Ear/Nose/Mouth/Throat cochlear implant Hematologic/Lymphatic hx blood clots hypokalemia Respiratory hx bilateral PNA hx bronchitis hx respiratory failure with hypoxia O2 therapy at night Cardiovascular cardiomegaly Gastrointestinal GERD  gallstones Endocrine The patient has no complaints or symptoms. MARICELLA, FILYAW ([id number]) Genitourinary renal disorder Immunological The patient has no complaints or symptoms. Integumentary (Skin) Complains or has symptoms of Wounds. Musculoskeletal restless leg syndrome Oncologic The patient has no complaints or symptoms. Psychiatric The patient has no complaints or symptoms. Medications aspirin 325 mg tablet oral tablet oral acetaminophen ER 650 mg tablet,extended release oral tablet extended release oral diltiazem ER 180 mg capsule,24 hr,extended release  oral capsule,extended release 24 hr oral calcium carbonate 600 mg (1,500 mg)-vitamin D3 200 unit tablet oral tablet oral budesonide 180 mcg/actuation breath activated powder inhaler inhalation aerosol powdr breath activated inhalation sennosides 8.6 mg tablet oral 2 2 tablet oral clopidogrel 75 mg tablet oral tablet oral esomeprazole magnesium 20 mg capsule,delayed release oral capsule,delayed release(DR/EC) oral doxycycline monohydrate 100 mg capsule oral capsule oral Objective Constitutional Pulse regular. Respirations normal and unlabored. Afebrile. Vitals Time Taken: 1:47 PM, Height: 59 in, Source: Stated, Weight: 140 lbs, Source: Measured, BMI: 28.3, Temperature: 97.7 F, Pulse: 82 bpm, Respiratory Rate: 16 breaths/min, Blood Pressure: 155/58 mmHg. Eyes Nonicteric. Reactive to light. Ears, Nose, Mouth, and Throat Lips, teeth, and gums WNL.Marland Kitchen Moist mucosa without lesions. Neck supple and nontender. No palpable supraclavicular or cervical adenopathy. Normal sized without goiter. TIFFANY, CALMES ([id number]) Respiratory WNL. No retractions.. Breath sounds WNL, No rubs, rales, rhonchi, or wheeze.. Cardiovascular BN not measured due to her wounds but recent notes from [place] noted. she has some swelling of her lower extremities. Gastrointestinal (GI) Abdomen without masses or tenderness.. No liver or spleen enlargement or tenderness.. Lymphatic No adneopathy. No adenopathy. No adenopathy. Musculoskeletal Adexa without tenderness or enlargement.. Digits and nails w/o clubbing, cyanosis, infection, petechiae, ischemia, or inflammatory conditions.Marland Kitchen Psychiatric Judgement and insight Intact.. No evidence of depression, anxiety, or agitation.. General Notes: the patient has linear ulcerations the largest being on her medial calf and this has some slough which was sharply debrided with a #3 curet and bleeding controlled with pressure. The other wounds along her left medial thigh and  left upper calf are healing and have healthy granulation tissue Integumentary (Hair, Skin) No suspicious lesions. No crepitus or fluctuance. No peri-wound warmth or erythema. No masses.. Wound #1 status is Open. Original cause of wound was Surgical Injury. The wound is located on the Left,Medial Upper Leg. The wound measures 2.4cm length x 0.5cm width x 1.4cm depth; 0.942cm^2 area and 1.319cm^3 volume. There is muscle, fat, and fascia exposed. There is no tunneling or undermining noted. There is a large amount of serosanguineous drainage noted. The wound margin is distinct with the outline attached to the wound base. There is large (67-100%) red, pink granulation within the wound bed. There is a small (1-33%) amount of necrotic tissue within the wound bed including Adherent Slough. The periwound skin appearance exhibited: Localized Edema, Moist, Erythema. The surrounding wound skin color is noted with erythema which is circumferential. Periwound temperature was noted as No Abnormality. The periwound has tenderness on palpation. Wound #2 status is Open. Original cause of wound was Surgical Injury. The wound is located on the Left,Proximal Lower Leg. The wound measures 1.2cm length x 0.6cm width x 0.9cm depth; 0.565cm^2 area and 0.509cm^3 volume. There is fat and fascia exposed. There is no tunneling or undermining noted. There is a large amount of serosanguineous drainage noted. The wound margin is distinct with the outline attached to the wound base. There is large (67-100%) red, pink granulation within the wound bed. There is a small (1-33%) amount  of necrotic tissue within the wound bed including Adherent Slough. The periwound skin appearance exhibited: Localized Edema, Erythema. The surrounding wound skin color is noted with erythema which is circumferential. Periwound temperature was noted as No Abnormality. The periwound has tenderness on palpation. Wound #3 status is Open. Original cause  of wound was Surgical Injury. The wound is located on the Left,Distal Lower Leg. The wound measures 7.5cm length x 3cm width x 0.7cm depth; 17.671cm^2 area TAMAIYA, BUMP. ([id number]) and 12.37cm^3 volume. There is muscle, fat, and fascia exposed. There is no tunneling or undermining noted. There is a large amount of serosanguineous drainage noted. The wound margin is distinct with the outline attached to the wound base. There is large (67-100%) red, pink granulation within the wound bed. There is a small (1-33%) amount of necrotic tissue within the wound bed including Adherent Slough. The periwound skin appearance exhibited: Localized Edema, Moist, Erythema. The surrounding wound skin color is noted with erythema which is circumferential. Periwound temperature was noted as No Abnormality. The periwound has tenderness on palpation. 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 After careful review of her recent vascular studies and nonhealing wound are have recommended: 1. packing of the wound with silver alginate and an appropriate foam border. 2. continue to see the vascular surgeons at [facility] as requested 3. Good amount of protein intake, vitamin A, vitamin C and zinc 4. Regular visits to the wound center Patient is hard of hearing but her husband has had all questions answered and will be compliant Procedures Wound #3 Wound #3 is an Open Surgical Wound located on the Left,Distal Lower Leg . There was a Skin/Subcutaneous Tissue Debridement ([id number]) debridement with total area of 15 sq cm performed by Christin Fudge, MD. with  the following instrument(s): Curette to remove Viable and Non-Viable tissue/material including Exudate, Fibrin/Slough, and Subcutaneous after achieving pain control using Lidocaine 4% Topical Solution. A time out was conducted at 14:23, prior to the start of the procedure. A Minimum amount of bleeding was controlled with Pressure. The procedure was tolerated well with a pain level of 0 throughout and a pain level of 0 following the procedure. Post Debridement Measurements: 7.5cm length x 3cm width x 0.7cm depth; 12.37cm^3 volume. Character of Wound/Ulcer Post Debridement requires further debridement. Severity of Tissue Post ZYLIE, MUMAW ([id number]) Debridement is: Fat layer exposed. Post procedure Diagnosis Wound #3: Same as Pre-Procedure Plan Wound Cleansing: Wound #1 Left,Medial Upper Leg: Clean wound with Normal Saline. Cleanse wound with mild soap and water Wound #2 Left,Proximal Lower Leg: Clean wound with Normal Saline. Cleanse wound with mild soap and water Wound #3 Left,Distal Lower Leg: Clean wound with Normal Saline. Cleanse wound with mild soap and water Anesthetic: Wound #1 Left,Medial Upper Leg: Topical Lidocaine 4% cream applied to wound bed prior to debridement - for clinic use Wound #2 Left,Proximal Lower Leg: Topical Lidocaine 4% cream applied to wound bed prior to debridement - for clinic use Wound #3 Left,Distal Lower Leg: Topical Lidocaine 4% cream applied to wound bed prior to debridement - for clinic use Primary Wound Dressing: Wound #1 Left,Medial Upper Leg: Aquacel Ag - pack light into wound  Wound #2 Left,Proximal Lower Leg: Aquacel Ag - pack light into wound Wound #3 Left,Distal Lower Leg: Aquacel Ag - pack light into wound Secondary Dressing: Wound #1 Left,Medial Upper Leg: Boardered Foam Dressing Wound #2 Left,Proximal Lower Leg: ABD and Kerlix/Conform Other - medipore tape Wound #3 Left,Distal Lower Leg: ABD and Kerlix/Conform Other -  medipore tape stretch net #5 Dressing Change Frequency: Wound #1 Left,Medial Upper Leg: Change dressing every other day. Wound #2 Left,Proximal Lower Leg: Change dressing every other day. Wound #3 Left,Distal Lower Leg: Change dressing every other day. ZYONA, PETTAWAY ([id number]) Follow-up Appointments: Wound #1 Left,Medial Upper Leg: Return Appointment in 1 week. Wound #2 Left,Proximal Lower Leg: Return Appointment in 1 week. Wound #3 Left,Distal Lower Leg: Return Appointment in 1 week. Edema Control: Wound #1 Left,Medial Upper Leg: Elevate legs to the level of the heart and pump ankles as often as possible Wound #2 Left,Proximal Lower Leg: Elevate legs to the level of the heart and pump ankles as often as possible Wound #3 Left,Distal Lower Leg: Elevate legs to the level of the heart and pump ankles as often as possible Additional Orders / Instructions: Wound #1 Left,Medial Upper Leg: Increase protein intake. Wound #2 Left,Proximal Lower Leg: Increase protein intake. Wound #3 Left,Distal Lower Leg: Increase protein intake. Though her HandP said that she had diabetes mellitus her husband and she both confirm that she has been never treated for diabetes mellitus. After careful review of her recent vascular studies and nonhealing wound are have recommended: 1. packing of the wound with silver alginate and an appropriate foam border. 2. continue to see the vascular surgeons at [facility] as requested 3. Good amount of protein intake, vitamin A, vitamin C and zinc 4. Regular visits to the wound center Patient is hard of hearing but her husband has had all questions answered and will be compliant Electronic Signature(s) Signed: 01/20/2016 8:23:03 AM By: Christin Fudge MD, FACS Previous Signature: 01/19/2016 2:34:03 PM Version By: Christin Fudge MD, FACS Entered By: Christin Fudge on 01/20/2016 08:23:03 Natasha [name]  ([id number]) -------------------------------------------------------------------------------- ROS/PFSH Details Patient Name: Natasha [name] Date of Service: 01/19/2016 1:15 PM Medical Record Number: [id number] Patient Account Number: [id number] Date of Birth/Sex: [birth year] (83 y.o. Female) Treating RN: Ahmed Prima Primary Care Physician: Emily Filbert Other Clinician: Referring Physician: Maura Crandall Treating Physician/Extender: Frann Rider in Treatment: 0 Information Obtained From Patient Wound History Do you currently have one or more open woundso Yes How many open wounds do you currently haveo 3 Approximately how long have you had your woundso end of nov 2017 How have you been treating your wound(s) until [facility] nurse packs gauze Has your wound(s) ever healed and then re-openedo No Have you had any lab work done in the past montho No Have you tested positive for an antibiotic resistant organism (MRSA, VRE)o No Have you tested positive for osteomyelitis (bone infection)o No Have you had any tests for circulation on your legso Yes Who ordered the testo Dr. Verner Mould Where was the test doneo [facility] Have you had other problems associated with your woundso Swelling Eyes Complaints and Symptoms: Positive for: Glasses / Contacts Integumentary (Skin) Complaints and Symptoms: Positive for: Wounds Constitutional Symptoms (General Health) Complaints and Symptoms: No Complaints or Symptoms Ear/Nose/Mouth/Throat Complaints and Symptoms: Review of System Notes: cochlear implant Hematologic/Lymphatic Complaints and Symptoms: Review of System Notes: hx blood clots CYLINDA, SANTOLI ([id number]) hypokalemia Medical History: Positive for: Anemia Respiratory Complaints and Symptoms: Review of System Notes: hx bilateral PNA hx  bronchitis hx respiratory failure with hypoxia O2 therapy at night Medical History: Positive for: Asthma Cardiovascular Complaints and  Symptoms: Review of System Notes: cardiomegaly Medical History: Positive for: Hypertension; Peripheral Venous Disease Gastrointestinal Complaints and Symptoms: Review of System Notes: GERD gallstones Endocrine Complaints and Symptoms: No Complaints or Symptoms Genitourinary Complaints and Symptoms: Review of System Notes: renal disorder Immunological Complaints and Symptoms: No Complaints or Symptoms Musculoskeletal RAYLEN, KEN ([id number]) Complaints and Symptoms: Review of System Notes: restless leg syndrome Medical History: Positive for: Osteoarthritis Neurologic Medical History: Positive for: Neuropathy Oncologic Complaints and Symptoms: No Complaints or Symptoms Psychiatric Complaints and Symptoms: No Complaints or Symptoms Immunizations Pneumococcal Vaccine: Received Pneumococcal Vaccination: Yes Family and Social History Cancer: Yes - Siblings, Child; Heart Disease: Yes - Mother, Father; Hereditary Spherocytosis: No; Hypertension: No; Kidney Disease: No; Lung Disease: No; Seizures: No; Stroke: No; Thyroid Problems: No; Tuberculosis: No; Former smoker - quit 1987; Marital Status - Married; Alcohol Use: Never; Drug Use: No History; Caffeine Use: Never; Financial Concerns: No; Food, Clothing or Shelter Needs: No; Support System Lacking: No; Transportation Concerns: No; Advanced Directives: No; Patient does not want information on Advanced Directives; Do not resuscitate: No; Living Will: Yes (Not Provided); Medical Power of Attorney: Yes - Evette Doffing (husband) (Not Provided) Physician Affirmation I have reviewed and agree with the above information. Electronic Signature(s) Signed: 01/19/2016 2:35:57 PM By: Christin Fudge MD, FACS Signed: 01/19/2016 3:33:30 PM By: Alric Quan Previous Signature: 01/19/2016 2:05:36 PM Version By: Christin Fudge MD, FACS Entered By: Christin Fudge on 01/19/2016 14:15:50 Natasha [name]  ([id number]) -------------------------------------------------------------------------------- SuperBill Details Patient Name: Natasha [name] Date of Service: 01/19/2016 Medical Record Number: [id number] Patient Account Number: [id number] Date of Birth/Sex: [birth year] (83 y.o. 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 Facility Procedures CPT4 Code Description: 96283662 99213 - WOUND CARE VISIT-LEV 3 EST PT Modifier: Quantity: 1 CPT4 Code Description: [id number] 11042 - DEB SUBQ TISSUE 20 SQ CM/< ICD-10 Description Diagnosis I70.242 Atherosclerosis 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 L97.222 Non-pressure chronic ulcer of left calf with fat layer e L97.122 Non-pressure chronic  ulcer of left thigh with fat layer Modifier: ration of ca xposed exposed Quantity: 1 lf Physician Procedures CPT4 Code Description: [id number] 81275 - WC PHYS LEVEL 4 - NEW PT ICD-10 Description Diagnosis I70.242 Atherosclerosis 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 L97.222 Non-pressure chronic ulcer of left calf with fat layer e L97.122 Non-pressure chronic  ulcer of left thigh with fat layer Modifier: 25 ration of ca xposed exposed Quantity: 1 lf CPT4 Code Description: [id number] 94496 - WC PHYS SUBQ TISS 20 SQ CM ICD-10 Description Diagnosis I70.242 Atherosclerosis 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 L97.222 Non-pressure chronic ulcer of left calf with fat layer e L97.122 Non-pressure chronic  ulcer of left thigh with fat layer TINEY, ZIPPER ([id number]) Modifier: ration of ca xposed  exposed Quantity: 1 lf Electronic Signature(s) Signed: 01/19/2016 3:33:30 PM By: Alric Quan Signed: 01/20/2016 8:20:37 AM By: Christin Fudge MD, FACS Previous Signature: 01/19/2016 2:34:55 PM Version By: Christin Fudge MD, FACS Entered By: Alric Quan on 01/19/2016 14:52:10

## 2016-01-19 NOTE — ED Provider Notes (Signed)
[facility] Emergency Department Provider Note  ____________________________________________  Time seen: Approximately 5:17 PM  I have reviewed the triage vital signs and the nursing notes.   HISTORY  Chief Complaint Foot Pain    HPI Natasha [name] is a 83 y.o. female who presents to emergency department complaining of sudden onset right foot pain. Patient states that she has pain in the heel and arch aspect of her right foot. Patient states that she was walking through her house when she stepped and felt a sharp pain. She denies any direct trauma to the area. She denies any numbness or tingling in her toes.  Patient has a significant medical history as well as somewhat recent previous surgery to the left lower extremity with repeated infections and ulcerations. Patient is being seen by a podiatrist for her left foot for chronic ulceration. She denies any problems with her right lower extremity or right leg prior to today.   Past Medical History:  Diagnosis Date  . Anemia   . Arthritis   . Asthma   . Bilateral pneumonia may 2017  . Bronchitis   . Cardiomegaly   . Chronic respiratory failure with hypoxia ([place])   . Cochlear implant in place    bilateral, [name], 04/26/2007   . Gallstones 2015  . GERD (gastroesophageal reflux disease)   . H/O blood clots 2014  . History of home oxygen therapy    at night  . Hypertension   . Hypokalemia   . Peripheral vascular disease ([place])    with arterial clots- on xarelto  . Polyneuropathy ([place])   . Renal disorder    Chronic Kidney Disease, Stage 5  . Restless leg syndrome     Patient Active Problem List   Diagnosis Date Noted  . Sepsis ([place]) 08/24/2015  . HCAP (healthcare-associated pneumonia) 06/27/2015  . Pneumonia 06/07/2015  . Hypotension 03/11/2015  . 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 Ischemic leg 02/27/2015  . Calculus of gallbladder with other cholecystitis, without  mention of obstruction 04/20/2013    Past Surgical History:  Procedure Laterality Date  . ABDOMINAL HYSTERECTOMY  1960  . APPENDECTOMY  1946  . BREAST BIOPSY Bilateral    negative  . CHOLECYSTECTOMY  04-24-13  . COCHLEAR IMPLANT  2009  . COLONOSCOPY  2015   Dr. Rayann Heman   . EYE SURGERY  2008,2009   cataract  . KYPHOPLASTY N/A 08/19/2015   Procedure: KYPHOPLASTY;  Surgeon: Hessie Knows, MD;  Location: [facility] ORS;  Service: Orthopedics;  Laterality: N/A;  . PERIPHERAL VASCULAR CATHETERIZATION N/A 02/27/2015   Procedure: Abdominal Aortogram w/Lower Extremity;  Surgeon: Algernon Huxley, MD;  Location: [place] [facility];  Service: Cardiovascular;  Laterality: N/A;  . PERIPHERAL VASCULAR CATHETERIZATION  02/27/2015   Procedure: Lower Extremity Intervention;  Surgeon: Algernon Huxley, MD;  Location: [place] [facility];  Service: Cardiovascular;;  . PERIPHERAL VASCULAR CATHETERIZATION Left 02/28/2015   Procedure: Lower Extremity Angiography;  Surgeon: Algernon Huxley, MD;  Location: [place] [facility];  Service: Cardiovascular;  Laterality: Left;  . PERIPHERAL VASCULAR CATHETERIZATION  02/28/2015   Procedure: Lower Extremity Intervention;  Surgeon: Algernon Huxley, MD;  Location: [place] [facility];  Service: Cardiovascular;;  . PERIPHERAL VASCULAR CATHETERIZATION Left 05/22/2015   Procedure: Lower Extremity Angiography;  Surgeon: Algernon Huxley, MD;  Location: [place] [facility];  Service: Cardiovascular;  Laterality: Left;  . PERIPHERAL VASCULAR CATHETERIZATION  05/22/2015   Procedure: Lower Extremity Intervention;  Surgeon: Algernon Huxley, MD;  Location: [place] [facility];  Service: Cardiovascular;;  . PERIPHERAL VASCULAR CATHETERIZATION Left 05/23/2015   Procedure: Lower Extremity Angiography;  Surgeon: Algernon Huxley, MD;  Location: [place] [facility];  Service: Cardiovascular;  Laterality: Left;  . PERIPHERAL VASCULAR CATHETERIZATION  05/23/2015   Procedure: Lower Extremity Intervention;  Surgeon: Algernon Huxley,  MD;  Location: [place] [facility];  Service: Cardiovascular;;  . stent placement  2006-2014   multiple stent placements in legs    Prior to Admission medications   Medication Sig Start Date End Date Taking? Authorizing Provider  albuterol (PROVENTIL HFA;VENTOLIN HFA) 108 (90 BASE) MCG/ACT inhaler Inhale 2 puffs into the lungs 2 (two) times daily as needed for wheezing or shortness of breath.     Historical Provider, MD  albuterol (PROVENTIL) (2.5 MG/3ML) 0.083% nebulizer solution Take 3 mLs (2.5 mg total) by nebulization every 6 (six) hours as needed for wheezing or shortness of breath. Patient not taking: Reported on 11/19/2015 06/11/15   Loletha Grayer, MD  amoxicillin-clavulanate (AUGMENTIN) 875-125 MG tablet Take 1 tablet by mouth every 12 (twelve) hours. Patient not taking: Reported on 11/19/2015 08/26/15   Fritzi Mandes, MD  aspirin EC 325 MG tablet Take 325 mg by mouth at bedtime.     Historical Provider, MD  budesonide (PULMICORT) 180 MCG/ACT inhaler Inhale 2 puffs into the lungs 2 (two) times daily.    Historical Provider, MD  Calcium Carbonate-Vit D-Min (CALCIUM 1200) 1200-1000 MG-UNIT CHEW Chew 1 tablet by mouth daily.    Historical Provider, MD  cholecalciferol (VITAMIN D) 1000 units tablet Take 1,000 Units by mouth every morning.     Historical Provider, MD  clopidogrel (PLAVIX) 75 MG tablet Take 75 mg by mouth daily.    Historical Provider, MD  diltiazem (CARDIZEM CD) 180 MG 24 hr capsule Take 180 mg by mouth every morning.     Historical Provider, MD  docusate sodium (COLACE) 100 MG capsule Take 400 mg by mouth at bedtime.    Historical Provider, MD  Esomeprazole Magnesium (NEXIUM PO) Take 22.3 mg by mouth 2 (two) times daily.     Historical Provider, MD  ferrous sulfate 325 (65 FE) MG tablet Take 1 tablet (325 mg total) by mouth daily. 05/24/15   Kimberly A Stegmayer, PA-C  furosemide (LASIX) 20 MG tablet Take 20 mg by mouth 2 (two) times daily.    Historical Provider, MD  gabapentin  (NEURONTIN) 300 MG capsule Take 300 mg by mouth 2 (two) times daily.    Historical Provider, MD  glucosamine-chondroitin 500-400 MG tablet Take 1 tablet by mouth daily.    Historical Provider, MD  MAGNESIUM PO Take 400 mg by mouth at bedtime.     Historical Provider, MD  Melatonin 5 MG TABS Take 5 mg by mouth at bedtime.     Historical Provider, MD  montelukast (SINGULAIR) 10 MG tablet Take 10 mg by mouth every morning.     Historical Provider, MD  oxyCODONE (ROXICODONE) 5 MG immediate release tablet Take 1 tablet (5 mg total) by mouth every 4 (four) hours as needed for severe pain. 08/19/15   Hessie Knows, MD  oxyCODONE-acetaminophen (PERCOCET/ROXICET) 5-325 MG tablet Take 1 tablet by mouth daily as needed for severe pain.     Historical Provider, MD  polyethylene glycol (MIRALAX / GLYCOLAX) packet Take 17 g by mouth daily as needed. Patient taking differently: Take 17 g by mouth 2 (two) times daily. Mix in 8 oz liquid  and drink 06/11/15   Loletha Grayer, MD  potassium chloride SA (K-DUR,KLOR-CON) 20 MEQ tablet Take 20-40 mEq by mouth See admin instructions. Take 2 tablets (40 mEq) by mouth every morning, Take 20 mEq by mouth every afternoon, and Take 2 tablets (40 mEq) by mouth every night at bedtime.    Historical Provider, MD  pramipexole (MIRAPEX) 0.5 MG tablet Take 0.5 mg by mouth 2 (two) times daily. 06/30/15   Historical Provider, MD  predniSONE (DELTASONE) 10 MG tablet Take 1 tablet (10 mg total) by mouth daily. 01/19/16   Charline Bills Terius Jacuinde, PA-C  rivaroxaban (XARELTO) 20 MG TABS tablet Take 20 mg by mouth every morning.     Historical Provider, MD  simvastatin (ZOCOR) 20 MG tablet Take 20 mg by mouth at bedtime. 07/03/15   Historical Provider, MD  temazepam (RESTORIL) 15 MG capsule Take 15 mg by mouth at bedtime as needed for sleep.    Historical Provider, MD    Allergies Codeine; Hydrocodone; Levofloxacin; and Tramadol  Family History  Problem Relation Age of Onset  . Heart disease  Mother   . Heart disease Father   . Cancer Sister     lung  . Breast cancer Daughter     Social History Social History  Substance Use Topics  . Smoking status: Former Smoker    Types: Cigarettes  . Smokeless tobacco: Never Used  . Alcohol use No     Review of Systems  Constitutional: No fever/chills Cardiovascular: no chest pain. Respiratory: no cough. No SOB. Gastrointestinal: No abdominal pain.  No nausea, no vomiting.   Musculoskeletal: Positive for sharp right plantar foot pain Skin: Negative for rash, abrasions, lacerations, ecchymosis. Neurological: Negative for headaches, focal weakness or numbness. 10-point ROS otherwise negative.  ____________________________________________   PHYSICAL EXAM:  VITAL SIGNS: ED Triage Vitals  Enc Vitals Group     BP 01/19/16 1610 132/63     Pulse Rate 01/19/16 1610 78     Resp 01/19/16 1610 14     Temp 01/19/16 1615 98.3 F (36.8 C)     Temp Source 01/19/16 1615 Oral     SpO2 01/19/16 1610 97 %     Weight 01/19/16 1610 140 lb (63.5 kg)     Height 01/19/16 1610 4\' 11"  (1.499 m)     Head Circumference --      Peak Flow --      Pain Score 01/19/16 1611 8     Pain Loc --      Pain Edu? --      Excl. in [place]? --      Constitutional: Alert and oriented. Well appearing and in no acute distress. Eyes: Conjunctivae are normal. PERRL. EOMI. Head: Atraumatic. Neck: No stridor.    Cardiovascular: Normal rate, regular rhythm. Normal S1 and S2.  Good peripheral circulation. Respiratory: Normal respiratory effort without tachypnea or retractions. Lungs CTAB. Good air entry to the bases with no decreased or absent breath sounds. Musculoskeletal: Full range of motion to all extremities. No gross deformities appreciated.No gross deformity or edema noted to the right foot on inspection. No erythema. Patient is very tender to palpation along the calcaneus to mid foot. No palpable abnormality. Full range of motion ankle and all digits right  foot. Dorsalis pedis pulse intact. Sensation intact 5 digits. Cap refill less than 2 seconds all 5 digits. Neurologic:  Normal speech and language. No gross focal neurologic deficits are appreciated.  Skin:  Skin is warm, dry and intact. No rash  noted. Psychiatric: Mood and affect are normal. Speech and behavior are normal. Patient exhibits appropriate insight and judgement.   ____________________________________________   LABS (all labs ordered are listed, but only abnormal results are displayed)  Labs Reviewed - No data to display ____________________________________________  EKG   ____________________________________________  RADIOLOGY Diamantina Providence Deetra Booton, personally viewed and evaluated these images (plain radiographs) as part of my medical decision making, as well as reviewing the written report by the radiologist.  Dg Foot Complete Right  Result Date: 01/19/2016 CLINICAL DATA:  RIGHT foot pain after walking into the kitchen today, sudden onset at plantar aspect from heel to ball of foot, no no known injury EXAM: RIGHT FOOT COMPLETE - 3+ VIEW COMPARISON:  None FINDINGS: Osseous demineralization. Joint spaces preserved. Band of sclerosis and slight deformity identified at the head of the proximal phalanx of the second toe favor subacute to old fracture. Questionable cortical defect at the margin of the cuboid on the lateral view, slightly ill-defined, cannot exclude subacute fracture. Tiny plantar calcaneal spur. No definite acute fracture, dislocation or bone destruction. IMPRESSION: Probable subacute to old fracture at head of proximal phalanx second toe. Questionable artifact versus subacute fracture at distal cuboid. Osseous demineralization. Electronically Signed   By: Lavonia Dana M.D.   On: 01/19/2016 17:48    ____________________________________________    PROCEDURES  Procedure(s) performed:    Procedures    Medications - No data to  display   ____________________________________________   INITIAL IMPRESSION / ASSESSMENT AND PLAN / ED COURSE  Pertinent labs & imaging results that were available during my care of the patient were reviewed by me and considered in my medical decision making (see chart for details).  Review of the [place] CSRS was performed in accordance of the [place] prior to dispensing any controlled drugs.  Clinical Course     Patient's diagnosis is consistent with Plantar fasciitis of the right foot. Patient is very tender to palpation over the calcaneus into the mid foot. X-ray obtained revealed possible old fracture to the cuboid bone. There is some mild diffuse pain in this region, however patient verbalizes that she had previous fracture to this area. At this time, fracture does not appear to be new or acute. Diagnosis is more consistent with plantar fasciitis.. Patient will be discharged home with prescriptions for oral steroids.. Patient has a podiatrist and is advised follow-up with same. Patient is given ED precautions to return to the ED for any worsening or new symptoms.     ____________________________________________  FINAL CLINICAL IMPRESSION(S) / ED DIAGNOSES  Final diagnoses:  Plantar fasciitis of right foot      NEW MEDICATIONS STARTED DURING THIS VISIT:  Discharge Medication List as of 01/19/2016  6:36 PM    START taking these medications   Details  predniSONE (DELTASONE) 10 MG tablet Take 1 tablet (10 mg total) by mouth daily., Starting Mon 01/19/2016, Print            This chart was dictated using voice recognition software/Dragon. Despite best efforts to proofread, errors can occur which can change the meaning. Any change was purely unintentional.    Darletta Moll, PA-C 01/19/16 2014    Eula Listen, MD 01/19/16 2322

## 2016-01-19 NOTE — ED Triage Notes (Signed)
Says right foot pain started after walking into kitchen today.  No injury.

## 2016-01-26 ENCOUNTER — Encounter: Payer: Medicare Other | Admitting: Surgery

## 2016-01-26 DIAGNOSIS — E11622 Type 2 diabetes mellitus with other skin ulcer: Secondary | ICD-10-CM | POA: Diagnosis not present

## 2016-01-26 NOTE — Progress Notes (Signed)
Natasha [name], Natasha [name] ([id number]) Visit Report for 01/26/2016 Arrival Information Details Patient Name: Natasha [name], Natasha [name] Date of Service: 01/26/2016 11:15 AM Medical Record Number: [id number] Patient Account Number: [id number] Date of Birth/Sex: 1928/08/06 (83 y.o. Female) Treating RN: Natasha [name] Primary Care Physician: Natasha [name] Other Clinician: Referring Physician: Emily [name] Treating Physician/Extender: Natasha [name] in Treatment: 1 Visit Information History Since Last Visit Added or deleted any medications: No Patient Arrived: Natasha [name] Any new allergies or adverse reactions: No Arrival Time: 11:09 Had a fall or experienced change in No Accompanied By: husband activities of daily living that may affect Transfer Assistance: None risk of falls: Patient Identification Verified: Yes Signs or symptoms of abuse/neglect since last No Secondary Verification Process Yes visito Completed: Hospitalized since last visit: No Patient Requires Transmission- No Has Dressing in Place as Prescribed: Yes Based Precautions: Pain Present Now: Yes Patient Has Alerts: Yes Patient Alerts: Patient on Blood Thinner Plavix Electronic Signature(s) Signed: 01/26/2016 3:12:31 PM By: Natasha Cool, RN, [name], Natasha [name] Entered By: Natasha Cool, RN, [name], Natasha on 01/26/2016 11:10:13 Natasha [name] ([id number]) -------------------------------------------------------------------------------- Encounter Discharge Information Details Patient Name: Natasha [name] Date of Service: 01/26/2016 11:15 AM Medical Record Number: [id number] Patient Account Number: [id number] Date of Birth/Sex: [birth year] (83 y.o. Female) Treating RN: Natasha [name] Primary Care Physician: Natasha [name] Other Clinician: Referring Physician: Emily [name] Treating Physician/Extender: Natasha [name] in Treatment: 1 Encounter Discharge Information Items Discharge Pain Level: 0 Discharge Condition: Stable Ambulatory Status:  Walker Discharge Destination: Home Private Transportation: Auto Accompanied By: husband Schedule Follow-up Appointment: Yes Medication Reconciliation completed Natasha [name] Yes provided to Patient/Care Natasha [name]: Clinical Summary of Care: Electronic Signature(s) Signed: 01/26/2016 3:12:31 PM By: Natasha Cool RN, [name], Natasha [name] Entered By: Natasha Cool, RN, [name], Natasha on 01/26/2016 11:39:06 Natasha [name] ([id number]) -------------------------------------------------------------------------------- Lower Extremity Assessment Details Patient Name: Natasha [name] Date of Service: 01/26/2016 11:15 AM Medical Record Number: [id number] Patient Account Number: [id number] Date of Birth/Sex: 1928-07-03 (83 y.o. Female) Treating RN: Natasha [name] Primary Care Physician: Natasha [name] Other Clinician: Referring Physician: Emily [name] Treating Physician/Extender: Natasha [name] in Treatment: 1 Edema Assessment Assessed: [Left: No] [Right: No] E[Left: dema] [Right: :] Calf Left: Right: Point of Measurement: 28 cm From Medial Instep 32 cm cm Ankle Left: Right: Point of Measurement: 10 cm From Medial Instep 23.8 cm cm Vascular Assessment Pulses: Dorsalis Pedis Palpable: [Left:Yes] Posterior Tibial Extremity colors, hair growth, Natasha [name] conditions: Extremity Color: [Left:Normal] Hair Growth on Extremity: [Left:No] Temperature of Extremity: [Left:[name]] Capillary Refill: [Left:< 3 seconds] Dependent Rubor: [Left:No] Blanched when Elevated: [Left:No] Lipodermatosclerosis: [Left:No] Toe Nail Assessment Left: Right: Thick: Yes Discolored: No Deformed: Yes Improper Length Natasha [name] Hygiene: No Electronic Signature(s) Signed: 01/26/2016 3:12:31 PM By: Natasha Cool, RN, [name], Natasha [name] Natasha [name], Natasha [name] ([id number]) Entered By: Natasha Cool, RN, [name], Natasha on 01/26/2016 11:22:01 Natasha [name], Natasha [name] ([id number]) -------------------------------------------------------------------------------- Multi Wound Chart  Details Patient Name: Natasha [name] Date of Service: 01/26/2016 11:15 AM Medical Record Number: [id number] Patient Account Number: [id number] Date of Birth/Sex: 1928-05-04 (83 y.o. Female) Treating RN: Natasha [name] Primary Care Physician: Natasha [name] Other Clinician: Referring Physician: Emily [name] Treating Physician/Extender: Natasha [name] in Treatment: 1 Vital Signs Height(in): 59 Pulse(bpm): 78 Weight(lbs): 140 Blood Pressure 165/60 (mmHg): Body Mass Index(BMI): 28 Temperature(F): 98.2 Respiratory Rate 16 (breaths/min): Photos: Wound Location: Left Upper Leg - Medial Left Lower Leg - Proximal Left Lower Leg - Distal Wounding Event: Surgical Injury Surgical Injury Surgical Injury Primary Etiology: Open Surgical Wound Open Surgical Wound Open Surgical  Wound Comorbid History: Anemia, Asthma, Anemia, Asthma, Anemia, Asthma, Hypertension, Peripheral Hypertension, Peripheral Hypertension, Peripheral Venous Disease, Venous Disease, Venous Disease, Osteoarthritis, Neuropathy Osteoarthritis, Neuropathy Osteoarthritis, Neuropathy Date Acquired: 12/04/2015 12/04/2015 12/04/2015 Weeks of Treatment: '1 1 1 ' Wound Status: Open Open Open Measurements L x W x D 2.2x0.5x1.6 1.2x0.4x0.6 7x2x1 (cm) Area (cm) : 0.864 0.377 10.996 Volume (cm) : 1.382 0.226 10.996 % Reduction in Area: 8.30% 33.30% 37.80% % Reduction in Volume: -4.80% 55.60% 11.10% Classification: Full Thickness With Full Thickness With Full Thickness With Exposed Support Exposed Support Exposed Support Structures Structures Structures Exudate Amount: Large Large Large Exudate Type: Serosanguineous Serosanguineous Serosanguineous Exudate Color: red, brown red, brown red, brown Wound Margin: Distinct, outline attached Distinct, outline attached Distinct, outline attached Granulation Amount: Large (67-100%) Large (67-100%) Large (67-100%) Natasha [name], Natasha [name]. ([id number]) Granulation Quality: Red, Pink Red,  Pink Red, Pink, Hyper- granulation Necrotic Amount: Small (1-33%) Small (1-33%) Small (1-33%) Exposed Structures: Fascia: Yes Fascia: Yes Fascia: Yes Fat: Yes Fat: Yes Fat: Yes Muscle: Yes Muscle: Yes Epithelialization: None Large (67-100%) None Debridement: N/A N/A Debridement ([id number]) Pre-procedure N/A N/A 11:27 Verification/Time Out Taken: Pain Control: N/A N/A Other Tissue Debrided: N/A N/A Fibrin/Slough, Subcutaneous Level: N/A N/A Skin/Subcutaneous Tissue Debridement Area (sq N/A N/A 10 cm): Instrument: N/A N/A Curette Bleeding: N/A N/A Minimum Hemostasis Achieved: N/A N/A Pressure Procedural Pain: N/A N/A 3 Post Procedural Pain: N/A N/A 0 Debridement Treatment N/A N/A Procedure was tolerated Response: well Post Debridement N/A N/A 7x2x1 Measurements L x W x D (cm) Post Debridement N/A N/A 10.996 Volume: (cm) Periwound Skin Texture: Edema: Yes Edema: Yes Edema: Yes Induration: Yes Induration: Yes Scarring: Yes Periwound Skin Moist: Yes No Abnormalities Noted Moist: Yes Moisture: Periwound Skin Color: Erythema: Yes Erythema: No Erythema: No Erythema Location: Circumferential N/A N/A Temperature: No Abnormality No Abnormality No Abnormality Tenderness on Yes Yes Yes Palpation: Wound Preparation: Ulcer Cleansing: Ulcer Cleansing: Ulcer Cleansing: Rinsed/Irrigated with Rinsed/Irrigated with Rinsed/Irrigated with Saline Saline Saline Topical Anesthetic Topical Anesthetic Topical Anesthetic Applied: Other: lidocaine Applied: Other: lidocaine Applied: Other: lidocaine 4% 4% 4% Procedures Performed: N/A N/A Debridement Natasha [name], Natasha [name] ([id number]) Treatment Notes Electronic Signature(s) Signed: 01/26/2016 11:32:01 AM By: Christin Fudge MD, FACS Entered By: Christin Fudge on 01/26/2016 11:32:01 Natasha [name] ([id number]) -------------------------------------------------------------------------------- [place]  Details Patient Name: Natasha [name] Date of Service: 01/26/2016 11:15 AM Medical Record Number: [id number] Patient Account Number: [id number] Date of Birth/Sex: 03/08/1928 (83 y.o. Female) Treating RN: Natasha [name] Primary Care Physician: Natasha [name] Other Clinician: Referring Physician: Emily [name] Treating Physician/Extender: Natasha [name] in Treatment: 1 Active Inactive Abuse / Safety / Falls / Self Care Management Nursing Diagnoses: Potential for falls Goals: Patient will remain injury free Date Initiated: 01/19/2016 Goal Status: Active Interventions: Assess fall risk on admission Natasha [name] as needed Assess: immobility, friction, shearing, incontinence upon admission Natasha [name] as needed Assess impairment of mobility on admission Natasha [name] as needed per policy Notes: Nutrition Nursing Diagnoses: Imbalanced nutrition Goals: Patient/caregiver agrees to Natasha [name] verbalizes understanding of need to use nutritional supplements Natasha [name]/or vitamins as prescribed Date Initiated: 01/19/2016 Goal Status: Active Interventions: Assess patient nutrition upon admission Natasha [name] as needed per policy Notes: Orientation to the Wound Care Program Nursing Diagnoses: Knowledge deficit related to the wound healing center program Natasha [name], Natasha [name] ([id number]) Goals: Patient/caregiver will verbalize understanding of the [place] Date Initiated: 01/19/2016 Goal Status: Active Interventions: Provide education on orientation to the wound center Notes: Pain, Acute or Chronic Nursing Diagnoses: Pain, acute or  chronic: actual or potential Potential alteration in comfort, pain Goals: Patient will verbalize adequate pain control Natasha [name] receive pain control interventions during procedures as needed Date Initiated: 01/19/2016 Goal Status: Active Patient/caregiver will verbalize adequate pain control between visits Date Initiated: 01/19/2016 Goal Status: Active Patient/caregiver will verbalize  comfort level met Date Initiated: 01/19/2016 Goal Status: Active Interventions: Assess comfort goal upon admission Complete pain assessment as per visit requirements Notes: Wound/Skin Impairment Nursing Diagnoses: Impaired tissue integrity Knowledge deficit related to ulceration/compromised skin integrity Goals: Ulcer/skin breakdown will have a volume reduction of 30% by week 4 Date Initiated: 01/19/2016 Goal Status: Active Ulcer/skin breakdown will have a volume reduction of 50% by week 8 Date Initiated: 01/19/2016 Natasha [name], Natasha [name] ([id number]) Goal Status: Active Ulcer/skin breakdown will have a volume reduction of 80% by week 12 Date Initiated: 01/19/2016 Goal Status: Active Interventions: Assess patient/caregiver ability to perform ulcer/skin care regimen upon admission Natasha [name] as needed Assess ulceration(s) every visit Notes: Electronic Signature(s) Signed: 01/26/2016 3:12:31 PM By: Natasha Cool, RN, [name], Natasha [name] Entered By: Natasha Cool, RN, [name], Natasha on 01/26/2016 11:27:05 Natasha [name] ([id number]) -------------------------------------------------------------------------------- Pain Assessment Details Patient Name: Natasha [name] Date of Service: 01/26/2016 11:15 AM Medical Record Number: [id number] Patient Account Number: [id number] Date of Birth/Sex: 1928-01-12 (83 y.o. Female) Treating RN: Natasha [name] Primary Care Physician: Natasha [name] Other Clinician: Referring Physician: Emily [name] Treating Physician/Extender: Natasha [name] in Treatment: 1 Active Problems Location of Pain Severity Natasha [name] Description of Pain Patient Has Paino No Site Locations With Dressing Change: No Pain Management Natasha [name] Medication Current Pain Management: Electronic Signature(s) Signed: 01/26/2016 3:12:31 PM By: Natasha Cool, RN, [name], Natasha [name] Entered By: Natasha Cool, RN, [name], Natasha on 01/26/2016 11:10:22 Natasha [name]  ([id number]) -------------------------------------------------------------------------------- Wound Assessment Details Patient Name: Natasha [name] Date of Service: 01/26/2016 11:15 AM Medical Record Number: [id number] Patient Account Number: [id number] Date of Birth/Sex: 10-Jun-1928 (83 y.o. Female) Treating RN: Natasha [name] Primary Care Physician: Natasha [name] Other Clinician: Referring Physician: Emily [name] Treating Physician/Extender: Natasha [name] in Treatment: 1 Wound Status Wound Number: 1 Primary Open Surgical Wound Etiology: Wound Location: Left Upper Leg - Medial Wound Open Wounding Event: Surgical Injury Status: Date Acquired: 12/04/2015 Comorbid Anemia, Asthma, Hypertension, Weeks Of Treatment: 1 History: Peripheral Venous Disease, Clustered Wound: No Osteoarthritis, Neuropathy Photos Wound Measurements Length: (cm) 2.2 Width: (cm) 0.5 Depth: (cm) 1.6 Area: (cm) 0.864 Volume: (cm) 1.382 % Reduction in Area: 8.3% % Reduction in Volume: -4.8% Epithelialization: None Tunneling: No Undermining: No Wound Description Full Thickness With Exposed Classification: Support Structures Wound Margin: Distinct, outline attached Exudate Large Amount: Exudate Type: Serosanguineous Exudate Color: red, brown Foul Odor After Cleansing: No Wound Bed Granulation Amount: Large (67-100%) Exposed Structure Granulation Quality: Red, Pink Fascia Exposed: Yes Necrotic Amount: Small (1-33%) Fat Layer Exposed: Yes Necrotic Quality: Adherent Slough Muscle Exposed: Yes Necrosis of Muscle: No Natasha [name], ANDREEN ([id number]) Periwound Skin Texture Texture Color No Abnormalities Noted: No No Abnormalities Noted: No Localized Edema: Yes Erythema: Yes Erythema Location: Circumferential Moisture No Abnormalities Noted: No Temperature / Pain Moist: Yes Temperature: No Abnormality Tenderness on Palpation: Yes Wound Preparation Ulcer Cleansing:  Rinsed/Irrigated with Saline Topical Anesthetic Applied: Other: lidocaine 4%, Treatment Notes Wound #1 (Left, Medial Upper Leg) 1. Cleansed with: Clean wound with Normal Saline 2. Anesthetic Topical Lidocaine 4% cream to wound bed prior to debridement 4. Dressing Applied: Aquacel Ag 5. Secondary Dressing Applied Bordered Foam Dressing Notes ABD Natasha [name] conform on lower wounds. Electronic Signature(s) Signed: 01/26/2016 3:12:31 PM  By: Natasha Cool, RN, [name], Natasha [name] Entered By: Natasha Cool, RN, [name], Natasha on 01/26/2016 11:17:47 Natasha [name] ([id number]) -------------------------------------------------------------------------------- Wound Assessment Details Patient Name: Natasha [name] Date of Service: 01/26/2016 11:15 AM Medical Record Number: [id number] Patient Account Number: [id number] Date of Birth/Sex: Jan 19, 1928 (83 y.o. Female) Treating RN: Natasha [name] Primary Care Physician: Natasha [name] Other Clinician: Referring Physician: Emily [name] Treating Physician/Extender: Natasha [name] in Treatment: 1 Wound Status Wound Number: 2 Primary Open Surgical Wound Etiology: Wound Location: Left Lower Leg - Proximal Wound Open Wounding Event: Surgical Injury Status: Date Acquired: 12/04/2015 Comorbid Anemia, Asthma, Hypertension, Weeks Of Treatment: 1 History: Peripheral Venous Disease, Clustered Wound: No Osteoarthritis, Neuropathy Photos Wound Measurements Length: (cm) 1.2 Width: (cm) 0.4 Depth: (cm) 0.6 Area: (cm) 0.377 Volume: (cm) 0.226 % Reduction in Area: 33.3% % Reduction in Volume: 55.6% Epithelialization: Large (67-100%) Tunneling: No Undermining: No Wound Description Full Thickness With Exposed Classification: Support Structures Wound Margin: Distinct, outline attached Exudate Large Amount: Exudate Type: Serosanguineous Exudate Color: red, brown Foul Odor After Cleansing: No Wound Bed Granulation Amount: Large (67-100%) Exposed  Structure Granulation Quality: Red, Pink Fascia Exposed: Yes Necrotic Amount: Small (1-33%) Fat Layer Exposed: Yes Necrotic Quality: Adherent [id number] LEKESHA, CLAW ([id number]) Periwound Skin Texture Texture Color No Abnormalities Noted: No No Abnormalities Noted: No Induration: Yes Erythema: No Localized Edema: Yes Temperature / Pain Moisture Temperature: No Abnormality No Abnormalities Noted: No Tenderness on Palpation: Yes Wound Preparation Ulcer Cleansing: Rinsed/Irrigated with Saline Topical Anesthetic Applied: Other: lidocaine 4%, Treatment Notes Wound #2 (Left, Proximal Lower Leg) 1. Cleansed with: Clean wound with Normal Saline 2. Anesthetic Topical Lidocaine 4% cream to wound bed prior to debridement 4. Dressing Applied: Aquacel Ag 5. Secondary Dressing Applied Bordered Foam Dressing Notes ABD Natasha [name] conform on lower wounds. Electronic Signature(s) Signed: 01/26/2016 3:12:31 PM By: Natasha Cool, RN, [name], Natasha [name] Entered By: Natasha Cool, RN, [name], Natasha on 01/26/2016 11:18:42 KHRISTY, KALAN ([id number]) -------------------------------------------------------------------------------- Wound Assessment Details Patient Name: Natasha [name] Date of Service: 01/26/2016 11:15 AM Medical Record Number: [id number] Patient Account Number: [id number] Date of Birth/Sex: 08/19/1928 (83 y.o. Female) Treating RN: Natasha [name] Primary Care Physician: Natasha [name] Other Clinician: Referring Physician: Emily [name] Treating Physician/Extender: Natasha [name] in Treatment: 1 Wound Status Wound Number: 3 Primary Open Surgical Wound Etiology: Wound Location: Left Lower Leg - Distal Wound Open Wounding Event: Surgical Injury Status: Date Acquired: 12/04/2015 Comorbid Anemia, Asthma, Hypertension, Weeks Of Treatment: 1 History: Peripheral Venous Disease, Clustered Wound: No Osteoarthritis, Neuropathy Photos Wound Measurements Length: (cm) 7 Width: (cm) 2 Depth:  (cm) 1 Area: (cm) 10.996 Volume: (cm) 10.996 % Reduction in Area: 37.8% % Reduction in Volume: 11.1% Epithelialization: None Tunneling: No Wound Description Full Thickness With Exposed Support Classification: Structures Wound Margin: Distinct, outline attached Exudate Large Amount: Exudate Type: Serosanguineous Exudate Color: red, brown Wound Bed Granulation Amount: Large (67-100%) Exposed Structure Granulation Quality: Red, Pink, Hyper-granulation Fascia Exposed: Yes Necrotic Amount: Small (1-33%) Fat Layer Exposed: Yes Necrotic Quality: Adherent Slough Muscle Exposed: Yes Necrosis of Muscle: No SHANTASIA, HUNNELL ([id number]) Periwound Skin Texture Texture Color No Abnormalities Noted: No No Abnormalities Noted: No Induration: Yes Erythema: No Localized Edema: Yes Temperature / Pain Scarring: Yes Temperature: No Abnormality Moisture Tenderness on Palpation: Yes No Abnormalities Noted: No Moist: Yes Wound Preparation Ulcer Cleansing: Rinsed/Irrigated with Saline Topical Anesthetic Applied: Other: lidocaine 4%, Treatment Notes Wound #3 (Left, Distal Lower Leg) 1. Cleansed with: Clean wound with Normal Saline 2. Anesthetic Topical Lidocaine 4%  cream to wound bed prior to debridement 4. Dressing Applied: Aquacel Ag 5. Secondary Dressing Applied Bordered Foam Dressing Notes ABD Natasha [name] conform on lower wounds. Electronic Signature(s) Signed: 01/26/2016 3:12:31 PM By: Natasha Cool, RN, [name], Natasha [name] Entered By: Natasha Cool, RN, [name], Natasha on 01/26/2016 11:23:13 VIANNY, SCHRAEDER ([id number]) -------------------------------------------------------------------------------- Vitals Details Patient Name: Natasha [name] Date of Service: 01/26/2016 11:15 AM Medical Record Number: [id number] Patient Account Number: [id number] Date of Birth/Sex: 06/13/1928 (83 y.o. Female) Treating RN: Natasha [name] Primary Care Physician: Natasha [name] Other Clinician: Referring  Physician: Emily [name] Treating Physician/Extender: Natasha [name] in Treatment: 1 Vital Signs Time Taken: 11:10 Temperature (F): 98.2 Height (in): 59 Pulse (bpm): 78 Weight (lbs): 140 Respiratory Rate (breaths/min): 16 Body Mass Index (BMI): 28.3 Blood Pressure (mmHg): 165/60 Reference Range: 80 - 120 mg / dl Electronic Signature(s) Signed: 01/26/2016 3:12:31 PM By: Natasha Cool, RN, [name], Natasha [name] Entered By: Natasha Cool, RN, [name], Natasha on 01/26/2016 11:10:38

## 2016-01-26 NOTE — Progress Notes (Addendum)
[name], Natasha ([id number]) Visit Report for 01/26/2016 Chief Complaint Document Details Patient Name: Natasha, [name] 01/26/2016 11:15 Date of Service: AM Medical Record [id number] Number: Patient Account Number: [id number] 1928-05-19 (83 y.o. Treating RN: Cornell Barman Date of Birth/Sex: Female) Other Clinician: Primary Care Physician: Emily Filbert Treating Christin Fudge Referring Physician: Emily Filbert Physician/Extender: Suella Grove in Treatment: 1 Information Obtained from: Patient Chief Complaint Patient presents to the wound care center today with an open arterial ulcer along with diabetes mellitus to the left lower extremity which she's had for over a month Electronic Signature(s) Signed: 01/26/2016 11:32:19 AM By: Christin Fudge MD, FACS Entered By: Christin Fudge on 01/26/2016 11:32:19 Natasha [name] ([id number]) -------------------------------------------------------------------------------- Debridement Details Patient Name: Natasha, [name] 01/26/2016 11:15 Date of Service: AM Medical Record [id number] Number: Patient Account Number: [id number] [birth year] (83 y.o. Treating RN: Cornell Barman Date of Birth/Sex: Female) Other Clinician: Primary Care Physician: Emily Filbert Treating Christin Fudge Referring Physician: Emily Filbert Physician/Extender: Suella Grove in Treatment: 1 Debridement Performed for Wound #3 Left,Distal Lower Leg Assessment: Performed By: Physician Christin Fudge, MD Debridement: Debridement Pre-procedure Yes - 11:27 Verification/Time Out Taken: Start Time: 11:28 Pain Control: Other : lidocaine 4% Level: Skin/Subcutaneous Tissue Total Area Debrided (L x 5 (cm) x 2 (cm) = 10 (cm) W): Tissue and other Viable, Non-Viable, Fibrin/Slough, Subcutaneous material debrided: Instrument: Curette Bleeding: Minimum Hemostasis Achieved: Pressure End Time: 11:30 Procedural Pain: 3 Post Procedural Pain: 0 Response to Treatment: Procedure was  tolerated well Post Debridement Measurements of Total Wound Length: (cm) 7 Width: (cm) 2 Depth: (cm) 1 Volume: (cm) 10.996 Character of Wound/Ulcer Post Requires Further Debridement Debridement: Muscle involvement without Severity of Tissue Post Debridement: necrosis Post Procedure Diagnosis Same as Pre-procedure Electronic Signature(s) Signed: 01/26/2016 11:32:13 AM By: Christin Fudge MD, FACS Natasha [name] ([id number]) Signed: 01/26/2016 3:12:31 PM By: [name], RN, [name], [name] RN, [name] Entered By: Christin Fudge on 01/26/2016 11:32:13 Natasha, [name] ([id number]) -------------------------------------------------------------------------------- HPI Details Patient Name: Natasha [name] 01/26/2016 11:15 Date of Service: AM Medical Record [id number] Number: Patient Account Number: [id number] Jun 11, 1928 (83 y.o. Treating RN: Cornell Barman Date of Birth/Sex: Female) Other Clinician: Primary Care Physician: Emily Filbert Treating Christin Fudge Referring Physician: Emily Filbert Physician/Extender: Suella Grove in Treatment: 1 History of Present Illness Location: left thigh and left calf in the area where there was previous vascular surgery Quality: Patient reports experiencing a dull pain to affected area(s). Severity: Patient states wound (s) are getting better. Duration: Patient has had the wound for > 2 months prior to seeking treatment at the wound center Timing: Pain in wound is Intermittent (comes and goes Context: The wound occurred when the patient had vascular surgery and the wounds were not healing very well Modifying Factors: Other treatment(s) tried include:local care and oral antibiotics Associated Signs and Symptoms: Patient reports having increase swelling. HPI Description: 83 year old female with significant past medical history of anemia, cellulitis and abscess of the leg, COPD, coronary artery disease, hypertension, osteoarthritis, peripheral vascular disease  with claudication, trigeminal neuralgia and type 2 diabetes mellitus without complications. A past surgical history significant for appendectomy, left femoral o peroneal bypass graft in October 2017, and debridement of skin and subcutis tissue on the left side in December 2017. She was treated by Dr. Lucky Cowboy earlier, with multiple left leg revascularization both endovascular and open. She is a former smoker who quit 30 years ago. When she was last seen at [facility], at the vascular surgery clinic she had a left lower extremity 3 separate wounds  along the saphenectomy site. She was placed on Bactrim DS for 10 days. She had a good signal over the peroneal artery and the bypass remains patent. Prior to surgery her left ABI was 0.46 and a right was 0.98. Note the patient was seen by Dr. Amalia Hailey of podiatry on 01/15/2016 and he saw for ulceration of her left great toe. Most recently the patient was seen by Dr. Maura Crandall, the vascular surgeon on January 5 and these notes are reviewed on Epic-- he noted that the 3 wounds on the left lower extremity are granulation very slowly, and he recommended packing with wet-to-dry Kerlix. The surgeon noted excellent signal over the peroneal artery and the recommendation was to see as at the wound center to help with healing Electronic Signature(s) Signed: 01/26/2016 11:32:24 AM By: Christin Fudge MD, FACS Entered By: Christin Fudge on 01/26/2016 11:32:24 Natasha [name] ([id number]) -------------------------------------------------------------------------------- Physical Exam Details Patient Name: Natasha, [name] 01/26/2016 11:15 Date of Service: AM Medical Record [id number] Number: Patient Account Number: [id number] [birth year] (83 y.o. Treating RN: Cornell Barman Date of Birth/Sex: Female) Other Clinician: Primary Care Physician: Emily Filbert Treating Christin Fudge Referring Physician: Emily Filbert Physician/Extender: Suella Grove in Treatment:  1 Constitutional . Pulse regular. Respirations normal and unlabored. Afebrile. . Eyes Nonicteric. Reactive to light. Ears, Nose, Mouth, and Throat Lips, teeth, and gums WNL.Marland Kitchen Moist mucosa without lesions. Neck supple and nontender. No palpable supraclavicular or cervical adenopathy. Normal sized without goiter. Respiratory WNL. No retractions.. Breath sounds WNL, No rubs, rales, rhonchi, or wheeze.. Cardiovascular Heart rhythm and rate regular, no murmur or gallop.. Pedal Pulses WNL. No clubbing, cyanosis or edema. Chest Breasts symmetical and no nipple discharge.. Breast tissue WNL, no masses, lumps, or tenderness.. Lymphatic No adneopathy. No adenopathy. No adenopathy. Musculoskeletal Adexa without tenderness or enlargement.. Digits and nails w/o clubbing, cyanosis, infection, petechiae, ischemia, or inflammatory conditions.. Integumentary (Hair, Skin) No suspicious lesions. No crepitus or fluctuance. No peri-wound warmth or erythema. No masses.Marland Kitchen Psychiatric Judgement and insight Intact.. No evidence of depression, anxiety, or agitation.. Notes the wound on the thigh continues to be quite deep superiorly and the rest of the wounds have minimal debris and some of it were sharply removed with a #3 curet and minimal bleeding controlled with pressure Electronic Signature(s) Signed: 01/26/2016 11:33:09 AM By: Christin Fudge MD, FACS Entered By: Christin Fudge on 01/26/2016 11:33:08 Natasha [name] ([id number]) -------------------------------------------------------------------------------- Physician Orders Details Patient Name: Natasha, [name] 01/26/2016 11:15 Date of Service: AM Medical Record [id number] Number: Patient Account Number: [id number] 12-14-1928 (83 y.o. Treating RN: Cornell Barman Date of Birth/Sex: Female) Other Clinician: Primary Care Physician: Emily Filbert Treating Christin Fudge Referring Physician: Emily Filbert Physician/Extender: Suella Grove in Treatment:  1 Verbal / Phone Orders: No Diagnosis Coding Wound Cleansing Wound #1 Left,Medial Upper Leg o Clean wound with Normal Saline. o Cleanse wound with mild soap and water Wound #2 Left,Proximal Lower Leg o Clean wound with Normal Saline. o Cleanse wound with mild soap and water Wound #3 Left,Distal Lower Leg o Clean wound with Normal Saline. o Cleanse wound with mild soap and water Anesthetic Wound #1 Left,Medial Upper Leg o Topical Lidocaine 4% cream applied to wound bed prior to debridement - for clinic use Wound #2 Left,Proximal Lower Leg o Topical Lidocaine 4% cream applied to wound bed prior to debridement - for clinic use Wound #3 Left,Distal Lower Leg o Topical Lidocaine 4% cream applied to wound bed prior to debridement - for clinic use Primary Wound Dressing Wound #1 Left,Medial  Upper Leg o Aquacel Ag - pack light into wound Wound #2 Left,Proximal Lower Leg o Aquacel Ag - pack light into wound Wound #3 Left,Distal Lower Leg o Aquacel Ag - pack light into wound Secondary Dressing Wound #1 Left,Medial Upper Leg Natasha, [name]. ([id number]) o Boardered Foam Dressing Wound #2 Left,Proximal Lower Leg o ABD and Kerlix/Conform o Other - tape Wound #3 Left,Distal Lower Leg o ABD and Kerlix/Conform o Other - tape Dressing Change Frequency Wound #1 Left,Medial Upper Leg o Change dressing every other day. Wound #2 Left,Proximal Lower Leg o Change dressing every other day. Wound #3 Left,Distal Lower Leg o Change dressing every other day. Follow-up Appointments Wound #1 Left,Medial Upper Leg o Return Appointment in 1 week. Wound #2 Left,Proximal Lower Leg o Return Appointment in 1 week. Wound #3 Left,Distal Lower Leg o Return Appointment in 1 week. Edema Control Wound #1 Left,Medial Upper Leg o Elevate legs to the level of the heart and pump ankles as often as possible Wound #2 Left,Proximal Lower Leg o Elevate legs  to the level of the heart and pump ankles as often as possible Wound #3 Left,Distal Lower Leg o Elevate legs to the level of the heart and pump ankles as often as possible Additional Orders / Instructions Wound #1 Left,Medial Upper Leg o Increase protein intake. Wound #2 Left,Proximal Lower Leg o Increase protein intake. Wound #3 Left,Distal Lower Leg Natasha, [name] ([id number]) o Increase protein intake. Electronic Signature(s) Signed: 01/26/2016 3:12:31 PM By: [name] RN, [name], [name] RN, [name] Signed: 01/26/2016 3:40:08 PM By: Christin Fudge MD, FACS Entered By: [name] RN, [name], [name] on 01/26/2016 11:36:57 HERMA, UBALLE ([id number]) -------------------------------------------------------------------------------- Problem List Details Patient Name: SHADDAI, SHAPLEY 01/26/2016 11:15 Date of Service: AM Medical Record [id number] Number: Patient Account Number: [id number] [birth year] (83 y.o. 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 Treating RN: Cornell Barman Date of Birth/Sex: Female) Other Clinician: Primary Care Physician: Emily Filbert Treating Christin Fudge Referring Physician: Emily Filbert Physician/Extender: Suella Grove in Treatment: 1 Subjective Chief Complaint Information obtained from Patient Patient presents to the wound care center today with an open arterial ulcer along with diabetes mellitus to the left lower extremity which she's had for over a month History of Present Illness (HPI) The following HPI elements were documented for the patient's wound: Location: left thigh and left calf in the area where there was previous vascular surgery Quality: Patient reports experiencing a dull pain to affected area(s). Severity: Patient states wound (s) are getting better. Duration: Patient has had the wound for > 2 months prior to seeking treatment at the wound center Timing: Pain in wound is Intermittent (comes and goes Context: The wound occurred when the patient had vascular surgery and the wounds were not healing very well Modifying Factors: Other treatment(s) tried  include:local care and oral antibiotics Associated Signs and Symptoms: Patient reports having increase swelling. 84 year old female with significant past medical history of anemia, cellulitis and abscess of the leg, COPD, coronary artery disease, hypertension, osteoarthritis, peripheral vascular disease with claudication, trigeminal neuralgia and type 2 diabetes mellitus without complications. A past surgical history significant for appendectomy, left femoral peroneal bypass graft in October 2017, and debridement of skin and subcutis tissue on the left side in December 2017. She was treated by Dr. Lucky Cowboy earlier, with multiple left leg revascularization both endovascular and open. She is a former smoker who quit 30 years ago. When she was last seen at [facility], at the vascular surgery clinic she had a left lower extremity 3  separate wounds along the saphenectomy site. She was placed on Bactrim DS for 10 days. She had a good signal over the peroneal artery and the bypass remains patent. Prior to surgery her left ABI was 0.46 and a right was 0.98. Note the patient was seen by Dr. Amalia Hailey of podiatry on 01/15/2016 and he saw for ulceration of her left great toe. Most recently the patient was seen by Dr. Maura Crandall, the vascular surgeon on January 5 and these notes are reviewed on Epic-- he noted that the 3 wounds on the left lower extremity are granulation very slowly, and he recommended packing with wet-to-dry Kerlix. The surgeon noted excellent signal over the peroneal artery and the recommendation was to see as at the wound center to help with healing Natasha, [name]. ([id number]) Objective Constitutional Pulse regular. Respirations normal and unlabored. Afebrile. Vitals Time Taken: 11:10 AM, Height: 59 in, Weight: 140 lbs, BMI: 28.3, Temperature: 98.2 F, Pulse: 78 bpm, Respiratory Rate: 16 breaths/min, Blood Pressure: 165/60 mmHg. Eyes Nonicteric. Reactive to light. Ears, Nose, Mouth, and Throat Lips, teeth, and gums WNL.Marland Kitchen Moist mucosa without lesions. Neck supple and nontender. No palpable supraclavicular or cervical adenopathy. Normal sized without goiter. Respiratory WNL. No retractions.. Breath sounds WNL, No rubs, rales, rhonchi, or wheeze.. Cardiovascular Heart rhythm and rate regular, no murmur or gallop.. Pedal Pulses WNL. No clubbing, cyanosis or edema. Chest Breasts symmetical and no nipple discharge.. Breast tissue WNL, no masses, lumps, or tenderness.. Lymphatic No adneopathy. No adenopathy. No adenopathy. Musculoskeletal Adexa without tenderness or enlargement.. Digits and nails w/o clubbing, cyanosis, infection, petechiae, ischemia, or inflammatory conditions.Marland Kitchen Psychiatric Judgement and insight Intact.. No evidence of depression, anxiety, or agitation.. General Notes: the wound  on the thigh continues to be quite deep superiorly and the rest of the wounds have minimal debris and some of it were sharply removed with a #3 curet and minimal bleeding controlled with pressure Integumentary (Hair, Skin) No suspicious lesions. No crepitus or fluctuance. No peri-wound warmth or erythema. No masses.. Wound #1 status is Open. Original cause of wound was Surgical Injury. The wound is located on the Left,Medial Upper Leg. The wound measures 2.2cm length x 0.5cm width x 1.6cm depth; 0.864cm^2 area Natasha, [name]. ([id number]) and 1.382cm^3 volume. There is muscle, fat, and fascia exposed. There is no tunneling or undermining noted. There is a large amount of serosanguineous drainage noted. The wound margin is distinct with the outline attached to the wound base. There is large (67-100%) red, pink granulation within the wound bed. There is a small (1-33%) amount of necrotic tissue within the wound bed including Adherent Slough. The periwound skin appearance exhibited: Localized Edema, Moist, Erythema. The surrounding wound skin color is noted with erythema which is circumferential. Periwound temperature was noted as  No Abnormality. The periwound has tenderness on palpation. Wound #2 status is Open. Original cause of wound was Surgical Injury. The wound is located on the Left,Proximal Lower Leg. The wound measures 1.2cm length x 0.4cm width x 0.6cm depth; 0.377cm^2 area and 0.226cm^3 volume. There is fat and fascia exposed. There is no tunneling or undermining noted. There is a large amount of serosanguineous drainage noted. The wound margin is distinct with the outline attached to the wound base. There is large (67-100%) red, pink granulation within the wound bed. There is a small (1-33%) amount of necrotic tissue within the wound bed including Adherent Slough. The periwound skin appearance exhibited: Induration, Localized Edema. The periwound skin appearance did not  exhibit: Erythema. Periwound temperature was noted as No Abnormality. The periwound has tenderness on palpation. Wound #3 status is Open. Original cause of wound was Surgical Injury. The wound is located on the Left,Distal Lower Leg. The wound measures 7cm length x 2cm width x 1cm depth; 10.996cm^2 area and 10.996cm^3 volume. There is muscle, fat, and fascia exposed. There is no tunneling noted. There is a large amount of serosanguineous drainage noted. The wound margin is distinct with the outline attached to the wound base. There is large (67-100%) red, pink granulation within the wound bed. There is a small (1-33%) amount of necrotic tissue within the wound bed including Adherent Slough. The periwound skin appearance exhibited: Induration, Localized Edema, Scarring, Moist. The periwound skin appearance did not exhibit: Erythema. Periwound temperature was noted as No Abnormality. The periwound has tenderness on palpation. 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 There was a Skin/Subcutaneous Tissue Debridement ([id number]) debridement with total area of 10 sq cm performed Natasha, [name] ([id number]) by Christin Fudge, MD. with the following instrument(s): Curette to remove Viable and Non-Viable tissue/material including Fibrin/Slough and Subcutaneous after achieving pain control using Other (lidocaine 4%). A time out was conducted at 11:27, prior to the start of the procedure. A Minimum amount of bleeding was controlled with Pressure. The procedure was tolerated well with a pain level of 3 throughout and a pain level of 0 following the procedure. Post Debridement  Measurements: 7cm length x 2cm width x 1cm depth; 10.996cm^3 volume. Character of Wound/Ulcer Post Debridement requires further debridement. Severity of Tissue Post Debridement is: Muscle involvement without necrosis. Post procedure Diagnosis Wound #3: Same as Pre-Procedure Plan Wound Cleansing: Wound #1 Left,Medial Upper Leg: Clean wound with Normal Saline. Cleanse wound with mild soap and water Wound #2 Left,Proximal Lower Leg: Clean wound with Normal Saline. Cleanse wound with mild soap and water Wound #3 Left,Distal Lower Leg: Clean wound with Normal Saline. Cleanse wound with mild soap and water Anesthetic: Wound #1 Left,Medial Upper Leg: Topical Lidocaine 4% cream applied to wound bed prior to debridement - for clinic use Wound #2 Left,Proximal Lower Leg: Topical Lidocaine 4% cream applied to wound bed prior to debridement - for clinic use Wound #3 Left,Distal Lower Leg: Topical Lidocaine 4% cream applied to wound bed prior to debridement - for clinic use Primary Wound Dressing: Wound #1 Left,Medial Upper Leg: Aquacel Ag - pack light into wound Wound #2 Left,Proximal Lower Leg: Aquacel Ag - pack light into wound Wound #3 Left,Distal  Lower Leg: Aquacel Ag - pack light into wound Secondary Dressing: Wound #1 Left,Medial Upper Leg: Boardered Foam Dressing Wound #2 Left,Proximal Lower Leg: ABD and Kerlix/Conform Other - tape Wound #3 Left,Distal Lower Leg: ABD and Kerlix/Conform Other - tape Natasha [name], MCGLINN ([id number]) Dressing Change Frequency: Wound #1 Left,Medial Upper Leg: Change dressing every other day. Wound #2 Left,Proximal Lower Leg: Change dressing every other day. Wound #3 Left,Distal Lower Leg: Change dressing every other day. Follow-up Appointments: Wound #1 Left,Medial Upper Leg: Return Appointment in 1 week. Wound #2 Left,Proximal Lower Leg: Return Appointment in 1 week. Wound #3 Left,Distal Lower Leg: Return Appointment in 1 week. Edema  Control: Wound #1 Left,Medial Upper Leg: Elevate legs to the level of the heart and pump ankles as often as possible Wound #2 Left,Proximal Lower Leg: Elevate legs to the level of the heart and pump ankles as often as possible Wound #3 Left,Distal Lower Leg: Elevate legs to the level of the heart and pump ankles as often as possible Additional Orders / Instructions: Wound #1 Left,Medial Upper Leg: Increase protein intake. Wound #2 Left,Proximal Lower Leg: Increase protein intake. Wound #3 Left,Distal Lower Leg: Increase protein intake. I have recommended: 1. packing of the wound with silver alginate and an appropriate foam border. 2. continue to see the vascular surgeons at [facility] as requested 3. Good amount of protein intake, vitamin A, vitamin C and zinc 4. Regular visits to the wound center Electronic Signature(s) Signed: 01/26/2016 3:43:13 PM By: Christin Fudge MD, FACS Previous Signature: 01/26/2016 11:33:45 AM Version By: Christin Fudge MD, FACS MICHON, KACZMAREK ([id number]) Entered By: Christin Fudge on 01/26/2016 15:43:12 Natasha [name] ([id number]) -------------------------------------------------------------------------------- SuperBill Details Patient Name: Natasha [name] Date of Service: 01/26/2016 Medical Record Number: [id number] Patient Account Number: [id number] Date of Birth/Sex: 1928-12-28 (83 y.o. 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 exposed Facility Procedures CPT4 Code Description: [id number] 11042 - DEB SUBQ TISSUE 20 SQ CM/< ICD-10 Description  Diagnosis I70.242 Atherosclerosis 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 L97.222 Non-pressure chronic ulcer of left calf with fat layer e L97.122 Non-pressure chronic  ulcer of left thigh with fat layer Modifier: ration of ca xposed exposed Quantity: 1 lf Physician Procedures CPT4 Code Description: [id number] - WC PHYS SUBQ TISS 20 SQ CM ICD-10 Description Diagnosis I70.242 Atherosclerosis 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 L97.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

## 2016-01-27 ENCOUNTER — Ambulatory Visit (INDEPENDENT_AMBULATORY_CARE_PROVIDER_SITE_OTHER): Payer: Medicare Other | Admitting: Podiatry

## 2016-01-27 DIAGNOSIS — M2580 Other specified joint disorders, unspecified joint: Secondary | ICD-10-CM

## 2016-01-27 DIAGNOSIS — M7751 Other enthesopathy of right foot: Secondary | ICD-10-CM | POA: Diagnosis not present

## 2016-01-30 ENCOUNTER — Encounter: Payer: Self-pay | Admitting: Medical Oncology

## 2016-01-30 ENCOUNTER — Inpatient Hospital Stay
Admission: EM | Admit: 2016-01-30 | Discharge: 2016-02-02 | DRG: 871 | Disposition: A | Discharge status: Home Health | Payer: Medicare Other | Attending: Internal Medicine | Admitting: Internal Medicine

## 2016-01-30 ENCOUNTER — Emergency Department: Payer: Medicare Other

## 2016-01-30 DIAGNOSIS — J181 Lobar pneumonia, unspecified organism: Secondary | ICD-10-CM

## 2016-01-30 DIAGNOSIS — S81802A Unspecified open wound, left lower leg, initial encounter: Secondary | ICD-10-CM | POA: Diagnosis present

## 2016-01-30 DIAGNOSIS — N185 Chronic kidney disease, stage 5: Secondary | ICD-10-CM | POA: Diagnosis present

## 2016-01-30 DIAGNOSIS — Z8249 Family history of ischemic heart disease and other diseases of the circulatory system: Secondary | ICD-10-CM | POA: Diagnosis not present

## 2016-01-30 DIAGNOSIS — Z79891 Long term (current) use of opiate analgesic: Secondary | ICD-10-CM

## 2016-01-30 DIAGNOSIS — I1311 Hypertensive heart and chronic kidney disease without heart failure, with stage 5 chronic kidney disease, or end stage renal disease: Secondary | ICD-10-CM | POA: Diagnosis present

## 2016-01-30 DIAGNOSIS — Z79899 Other long term (current) drug therapy: Secondary | ICD-10-CM

## 2016-01-30 DIAGNOSIS — Z7982 Long term (current) use of aspirin: Secondary | ICD-10-CM | POA: Diagnosis not present

## 2016-01-30 DIAGNOSIS — G2581 Restless legs syndrome: Secondary | ICD-10-CM | POA: Diagnosis present

## 2016-01-30 DIAGNOSIS — M1990 Unspecified osteoarthritis, unspecified site: Secondary | ICD-10-CM | POA: Diagnosis present

## 2016-01-30 DIAGNOSIS — J189 Pneumonia, unspecified organism: Secondary | ICD-10-CM | POA: Diagnosis present

## 2016-01-30 DIAGNOSIS — Z803 Family history of malignant neoplasm of breast: Secondary | ICD-10-CM | POA: Diagnosis not present

## 2016-01-30 DIAGNOSIS — Z7952 Long term (current) use of systemic steroids: Secondary | ICD-10-CM | POA: Diagnosis not present

## 2016-01-30 DIAGNOSIS — E876 Hypokalemia: Secondary | ICD-10-CM | POA: Diagnosis present

## 2016-01-30 DIAGNOSIS — J9611 Chronic respiratory failure with hypoxia: Secondary | ICD-10-CM | POA: Diagnosis present

## 2016-01-30 DIAGNOSIS — I517 Cardiomegaly: Secondary | ICD-10-CM | POA: Diagnosis present

## 2016-01-30 DIAGNOSIS — I739 Peripheral vascular disease, unspecified: Secondary | ICD-10-CM | POA: Diagnosis present

## 2016-01-30 DIAGNOSIS — G629 Polyneuropathy, unspecified: Secondary | ICD-10-CM | POA: Diagnosis present

## 2016-01-30 DIAGNOSIS — Z7902 Long term (current) use of antithrombotics/antiplatelets: Secondary | ICD-10-CM

## 2016-01-30 DIAGNOSIS — J45909 Unspecified asthma, uncomplicated: Secondary | ICD-10-CM | POA: Diagnosis present

## 2016-01-30 DIAGNOSIS — Z888 Allergy status to other drugs, medicaments and biological substances status: Secondary | ICD-10-CM

## 2016-01-30 DIAGNOSIS — H9190 Unspecified hearing loss, unspecified ear: Secondary | ICD-10-CM | POA: Diagnosis present

## 2016-01-30 DIAGNOSIS — Z9981 Dependence on supplemental oxygen: Secondary | ICD-10-CM | POA: Diagnosis not present

## 2016-01-30 DIAGNOSIS — E785 Hyperlipidemia, unspecified: Secondary | ICD-10-CM | POA: Diagnosis present

## 2016-01-30 DIAGNOSIS — Z87891 Personal history of nicotine dependence: Secondary | ICD-10-CM | POA: Diagnosis not present

## 2016-01-30 DIAGNOSIS — M6281 Muscle weakness (generalized): Secondary | ICD-10-CM

## 2016-01-30 DIAGNOSIS — A419 Sepsis, unspecified organism: Principal | ICD-10-CM | POA: Diagnosis present

## 2016-01-30 DIAGNOSIS — Z881 Allergy status to other antibiotic agents status: Secondary | ICD-10-CM

## 2016-01-30 DIAGNOSIS — K219 Gastro-esophageal reflux disease without esophagitis: Secondary | ICD-10-CM | POA: Diagnosis present

## 2016-01-30 DIAGNOSIS — Z885 Allergy status to narcotic agent status: Secondary | ICD-10-CM

## 2016-01-30 LAB — CBC WITH DIFFERENTIAL/PLATELET
BASOS ABS: 0 10*3/uL (ref 0–0.1)
Basophils Relative: 0 %
EOS ABS: 0 10*3/uL (ref 0–0.7)
EOS PCT: 0 %
HCT: 35.2 % (ref 35.0–47.0)
HEMOGLOBIN: 11.3 g/dL — AB (ref 12.0–16.0)
LYMPHS ABS: 1.1 10*3/uL (ref 1.0–3.6)
LYMPHS PCT: 5 %
MCH: 28.1 pg (ref 26.0–34.0)
MCHC: 32.2 g/dL (ref 32.0–36.0)
MCV: 87.3 fL (ref 80.0–100.0)
Monocytes Absolute: 0.8 10*3/uL (ref 0.2–0.9)
Monocytes Relative: 4 %
NEUTROS PCT: 91 %
Neutro Abs: 20.9 10*3/uL — ABNORMAL HIGH (ref 1.4–6.5)
PLATELETS: 300 10*3/uL (ref 150–440)
RBC: 4.03 MIL/uL (ref 3.80–5.20)
RDW: 17.3 % — ABNORMAL HIGH (ref 11.5–14.5)
WBC: 22.9 10*3/uL — AB (ref 3.6–11.0)

## 2016-01-30 LAB — COMPREHENSIVE METABOLIC PANEL
ALBUMIN: 3.4 g/dL — AB (ref 3.5–5.0)
ALT: 16 U/L (ref 14–54)
AST: 27 U/L (ref 15–41)
Alkaline Phosphatase: 66 U/L (ref 38–126)
Anion gap: 10 (ref 5–15)
BUN: 33 mg/dL — ABNORMAL HIGH (ref 6–20)
CHLORIDE: 96 mmol/L — AB (ref 101–111)
CO2: 28 mmol/L (ref 22–32)
CREATININE: 1.1 mg/dL — AB (ref 0.44–1.00)
Calcium: 9 mg/dL (ref 8.9–10.3)
GFR calc Af Amer: 52 mL/min — ABNORMAL LOW (ref >60)
GFR calc non Af Amer: 44 mL/min — ABNORMAL LOW (ref >60)
GLUCOSE: 101 mg/dL — AB (ref 65–99)
POTASSIUM: 3.3 mmol/L — AB (ref 3.5–5.1)
SODIUM: 134 mmol/L — AB (ref 135–145)
Total Bilirubin: 0.4 mg/dL (ref 0.3–1.2)
Total Protein: 6.2 g/dL — ABNORMAL LOW (ref 6.5–8.1)

## 2016-01-30 LAB — INFLUENZA PANEL BY PCR (TYPE A & B)
INFLBPCR: NEGATIVE
Influenza A By PCR: NEGATIVE

## 2016-01-30 LAB — LIPASE, BLOOD: LIPASE: 26 U/L (ref 11–51)

## 2016-01-30 LAB — MRSA PCR SCREENING: MRSA by PCR: NEGATIVE

## 2016-01-30 LAB — LACTIC ACID, PLASMA: LACTIC ACID, VENOUS: 1.5 mmol/L (ref 0.5–1.9)

## 2016-01-30 MED ORDER — MONTELUKAST SODIUM 10 MG PO TABS
10.0000 mg | ORAL_TABLET | ORAL | Status: DC
  Administered 2016-01-31 – 2016-02-02 (×3): 10 mg via ORAL
  Filled 2016-01-30 (×3): qty 1

## 2016-01-30 MED ORDER — CEFEPIME-DEXTROSE 2 GM/50ML IV SOLR
2.0000 g | Freq: Once | INTRAVENOUS | Status: AC
  Administered 2016-01-30: 2 g via INTRAVENOUS
  Filled 2016-01-30: qty 50

## 2016-01-30 MED ORDER — ALBUTEROL SULFATE (2.5 MG/3ML) 0.083% IN NEBU: 2.5000 mg | INHALATION_SOLUTION | Freq: Two times a day (BID) | RESPIRATORY_TRACT | Status: DC | PRN

## 2016-01-30 MED ORDER — FERROUS SULFATE 325 (65 FE) MG PO TABS
325.0000 mg | ORAL_TABLET | Freq: Every day | ORAL | Status: DC
  Administered 2016-01-31 – 2016-02-02 (×3): 325 mg via ORAL
  Filled 2016-01-30 (×3): qty 1

## 2016-01-30 MED ORDER — OXYCODONE HCL 5 MG PO TABS
2.5000 mg | ORAL_TABLET | ORAL | Status: DC | PRN
  Administered 2016-01-30: 2.5 mg via ORAL
  Filled 2016-01-30: qty 1

## 2016-01-30 MED ORDER — POLYETHYLENE GLYCOL 3350 17 G PO PACK: 17.0000 g | PACK | Freq: Every day | ORAL | Status: DC | PRN

## 2016-01-30 MED ORDER — SODIUM CHLORIDE 0.9 % IV BOLUS (SEPSIS)
500.0000 mL | Freq: Once | INTRAVENOUS | Status: AC
  Administered 2016-01-30: 14:00:00 via INTRAVENOUS

## 2016-01-30 MED ORDER — PANTOPRAZOLE SODIUM 40 MG PO TBEC
40.0000 mg | DELAYED_RELEASE_TABLET | Freq: Every day | ORAL | Status: DC
  Administered 2016-01-31 – 2016-02-02 (×3): 40 mg via ORAL
  Filled 2016-01-30 (×3): qty 1

## 2016-01-30 MED ORDER — ACETAMINOPHEN 325 MG PO TABS: 650.0000 mg | ORAL_TABLET | Freq: Four times a day (QID) | ORAL | Status: DC | PRN

## 2016-01-30 MED ORDER — SIMVASTATIN 20 MG PO TABS
20.0000 mg | ORAL_TABLET | Freq: Every day | ORAL | Status: DC
  Administered 2016-01-30: 20 mg via ORAL
  Filled 2016-01-30: qty 1

## 2016-01-30 MED ORDER — SODIUM CHLORIDE 0.9 % IV BOLUS (SEPSIS)
1000.0000 mL | Freq: Once | INTRAVENOUS | Status: AC
  Administered 2016-01-30: 1000 mL via INTRAVENOUS

## 2016-01-30 MED ORDER — POTASSIUM CHLORIDE CRYS ER 20 MEQ PO TBCR
40.0000 meq | EXTENDED_RELEASE_TABLET | Freq: Two times a day (BID) | ORAL | Status: DC
  Administered 2016-01-30 – 2016-01-31 (×2): 40 meq via ORAL
  Filled 2016-01-30 (×3): qty 2

## 2016-01-30 MED ORDER — BUDESONIDE 0.5 MG/2ML IN SUSP
0.5000 mg | Freq: Two times a day (BID) | RESPIRATORY_TRACT | Status: DC
  Administered 2016-01-30 – 2016-02-01 (×5): 0.5 mg via RESPIRATORY_TRACT
  Filled 2016-01-30 (×6): qty 2

## 2016-01-30 MED ORDER — ACETAMINOPHEN 650 MG RE SUPP: 650.0000 mg | Freq: Four times a day (QID) | RECTAL | Status: DC | PRN

## 2016-01-30 MED ORDER — PIPERACILLIN-TAZOBACTAM 3.375 G IVPB
3.3750 g | Freq: Three times a day (TID) | INTRAVENOUS | Status: DC
  Administered 2016-01-30 – 2016-02-02 (×8): 3.375 g via INTRAVENOUS
  Filled 2016-01-30 (×10): qty 50

## 2016-01-30 MED ORDER — VANCOMYCIN HCL IN DEXTROSE 750-5 MG/150ML-% IV SOLN
750.0000 mg | INTRAVENOUS | Status: DC
  Administered 2016-01-31: 750 mg via INTRAVENOUS
  Filled 2016-01-30: qty 150

## 2016-01-30 MED ORDER — CALCIUM CARBONATE ANTACID 500 MG PO CHEW
2.0000 | CHEWABLE_TABLET | Freq: Every day | ORAL | Status: DC
  Administered 2016-01-31 – 2016-02-02 (×3): 400 mg via ORAL
  Filled 2016-01-30 (×3): qty 2

## 2016-01-30 MED ORDER — VANCOMYCIN HCL IN DEXTROSE 1-5 GM/200ML-% IV SOLN
1000.0000 mg | Freq: Once | INTRAVENOUS | Status: AC
  Administered 2016-01-30: 1000 mg via INTRAVENOUS
  Filled 2016-01-30: qty 200

## 2016-01-30 MED ORDER — PRAMIPEXOLE DIHYDROCHLORIDE 0.25 MG PO TABS
0.5000 mg | ORAL_TABLET | Freq: Two times a day (BID) | ORAL | Status: DC
Start: 2016-01-30 — End: 2016-02-02
  Administered 2016-01-30 – 2016-02-02 (×6): 0.5 mg via ORAL
  Filled 2016-01-30 (×6): qty 2

## 2016-01-30 MED ORDER — ASPIRIN EC 325 MG PO TBEC
325.0000 mg | DELAYED_RELEASE_TABLET | Freq: Every day | ORAL | Status: DC
  Administered 2016-01-30 – 2016-02-01 (×3): 325 mg via ORAL
  Filled 2016-01-30 (×4): qty 1

## 2016-01-30 MED ORDER — ONDANSETRON HCL 4 MG PO TABS: 4.0000 mg | ORAL_TABLET | Freq: Four times a day (QID) | ORAL | Status: DC | PRN

## 2016-01-30 MED ORDER — DILTIAZEM HCL ER COATED BEADS 180 MG PO CP24
180.0000 mg | ORAL_CAPSULE | ORAL | Status: DC
  Administered 2016-01-31: 180 mg via ORAL
  Filled 2016-01-30 (×2): qty 1

## 2016-01-30 MED ORDER — GABAPENTIN 300 MG PO CAPS
300.0000 mg | ORAL_CAPSULE | Freq: Two times a day (BID) | ORAL | Status: DC
  Administered 2016-01-30 – 2016-02-02 (×6): 300 mg via ORAL
  Filled 2016-01-30 (×6): qty 1

## 2016-01-30 MED ORDER — ENOXAPARIN SODIUM 30 MG/0.3ML ~~LOC~~ SOLN
30.0000 mg | SUBCUTANEOUS | Status: DC
  Administered 2016-01-30 – 2016-01-31 (×2): 30 mg via SUBCUTANEOUS
  Filled 2016-01-30 (×2): qty 0.3

## 2016-01-30 MED ORDER — ONDANSETRON HCL 4 MG/2ML IJ SOLN: 4.0000 mg | Freq: Four times a day (QID) | INTRAMUSCULAR | Status: DC | PRN

## 2016-01-30 MED ORDER — VITAMIN D 1000 UNITS PO TABS
1000.0000 [IU] | ORAL_TABLET | ORAL | Status: DC
  Administered 2016-01-31 – 2016-02-02 (×3): 1000 [IU] via ORAL
  Filled 2016-01-30 (×3): qty 1

## 2016-01-30 MED ORDER — DOCUSATE SODIUM 100 MG PO CAPS
400.0000 mg | ORAL_CAPSULE | Freq: Every day | ORAL | Status: DC
  Administered 2016-01-30 – 2016-02-01 (×3): 400 mg via ORAL
  Filled 2016-01-30 (×3): qty 4

## 2016-01-30 MED ORDER — SODIUM CHLORIDE 0.9 % IV BOLUS (SEPSIS): 1000.0000 mL | Freq: Once | INTRAVENOUS | Status: DC

## 2016-01-30 MED ORDER — TEMAZEPAM 15 MG PO CAPS: 15.0000 mg | ORAL_CAPSULE | Freq: Every evening | ORAL | Status: DC | PRN

## 2016-01-30 MED ORDER — CALCIUM 1200 1200-1000 MG-UNIT PO CHEW: 1.0000 | CHEWABLE_TABLET | Freq: Every day | ORAL | Status: DC

## 2016-01-30 MED ORDER — CLOPIDOGREL BISULFATE 75 MG PO TABS
75.0000 mg | ORAL_TABLET | Freq: Every day | ORAL | Status: DC
Start: 2016-01-31 — End: 2016-02-02
  Administered 2016-01-31 – 2016-02-02 (×3): 75 mg via ORAL
  Filled 2016-01-30 (×3): qty 1

## 2016-01-30 MED ORDER — FUROSEMIDE 20 MG PO TABS
20.0000 mg | ORAL_TABLET | Freq: Two times a day (BID) | ORAL | Status: DC
  Administered 2016-01-30 – 2016-01-31 (×2): 20 mg via ORAL
  Filled 2016-01-30 (×2): qty 1

## 2016-01-30 MED ORDER — BUDESONIDE 180 MCG/ACT IN AEPB: 2.0000 | INHALATION_SPRAY | Freq: Two times a day (BID) | RESPIRATORY_TRACT | Status: DC

## 2016-01-30 MED ORDER — MELATONIN 5 MG PO TABS
5.0000 mg | ORAL_TABLET | Freq: Every day | ORAL | Status: DC
  Administered 2016-01-30 – 2016-02-01 (×3): 5 mg via ORAL
  Filled 2016-01-30 (×4): qty 1

## 2016-01-30 MED ORDER — ALBUTEROL SULFATE HFA 108 (90 BASE) MCG/ACT IN AERS: 2.0000 | INHALATION_SPRAY | Freq: Two times a day (BID) | RESPIRATORY_TRACT | Status: DC | PRN

## 2016-01-30 NOTE — Progress Notes (Signed)
   [place] [facility] Day: 0 days Natasha [name] is a 83 y.o. female presenting with Chills and Fever due to Pneumonia.   Advance care planning discussed with patient  with additional Family at bedside. All questions in regards to overall condition and expected prognosis answered. The decision was made to continue current code status  CODE STATUS: full Time spent: 15 minutes

## 2016-01-30 NOTE — ED Triage Notes (Signed)
Pt from [facility] living facility via ems with reports of fever and chills that began this am. Pt reports her temp at home was 101.3. Pt has also had non productive cough. Pt wears 2L [place] at home. When ems arrived pt was 90% on 2L.

## 2016-01-30 NOTE — H&P (Signed)
[facility] NAME: Natasha [name]    MR#:  [id number]  DATE OF BIRTH:  [birth year]  DATE OF ADMISSION:  01/30/2016  PRIMARY CARE PHYSICIAN: Rusty Aus, MD   REQUESTING/REFERRING PHYSICIAN: Dr. Delman Kitten  CHIEF COMPLAINT:   Chief Complaint  Patient presents with  . Chills  . Fever    HISTORY OF PRESENT ILLNESS:  Natasha [name]  is a 83 y.o. female with a known history of Asthma, peripheral vascular disease, hypertension, chronic respiratory failure on oxygen at bedtime, previous history of pneumonia, restless leg syndrome, who presents to the hospital due to chills, fever, cough and also noted to be mildly hypotensive at the independent living at [facility]. Patient herself is very hard of hearing therefore most of the history obtained from the husband at bedside. As per the husband patient was having chills all last night and he checked her temperature this morning was 101. He called the nurse at [facility] who came to evaluate the patient and she was noted to be hypotensive with systolic blood pressures in 80s to 90s and with ongoing fever she recommended patient be evaluated in the emergency room. In the emergency room patient was noted to be hypoxic with O2 sats in the 80s and had chest x-ray findings of multifocal pneumonia. Hospitalist services were contacted further treatment evaluation. Patient denies any chest pain, nausea, vomiting, abdominal pain, melena, hematochezia, sick contacts or any other associated symptoms presently.  PAST MEDICAL HISTORY:   Past Medical History:  Diagnosis Date  . Anemia   . Arthritis   . Asthma   . Bilateral pneumonia may 2017  . Bronchitis   . Cardiomegaly   . Chronic respiratory failure with hypoxia ([place])   . Cochlear implant in place    bilateral, [name], 04/26/2007   . Gallstones 2015  . GERD (gastroesophageal reflux disease)   . H/O blood clots 2014  . History of home  oxygen therapy    at night  . Hypertension   . Hypokalemia   . Peripheral vascular disease ([name])    with arterial clots- on xarelto  . Polyneuropathy ([place])   . Renal disorder    Chronic Kidney Disease, Stage 5  . Restless leg syndrome     PAST SURGICAL HISTORY:   Past Surgical History:  Procedure Laterality Date  . ABDOMINAL HYSTERECTOMY  1960  . APPENDECTOMY  1946  . BREAST BIOPSY Bilateral    negative  . CHOLECYSTECTOMY  04-24-13  . COCHLEAR IMPLANT  2009  . COLONOSCOPY  2015   Dr. Rayann Heman   . EYE SURGERY  2008,2009   cataract  . KYPHOPLASTY N/A 08/19/2015   Procedure: KYPHOPLASTY;  Surgeon: Hessie Knows, MD;  Location: [facility]S;  Service: Orthopedics;  Laterality: N/A;  . PERIPHERAL VASCULAR CATHETERIZATION N/A 02/27/2015   Procedure: Abdominal Aortogram w/Lower Extremity;  Surgeon: Algernon Huxley, MD;  Location: [facility];  Service: Cardiovascular;  Laterality: N/A;  . PERIPHERAL VASCULAR CATHETERIZATION  02/27/2015   Procedure: Lower Extremity Intervention;  Surgeon: Algernon Huxley, MD;  Location: [facility];  Service: Cardiovascular;;  . PERIPHERAL VASCULAR CATHETERIZATION Left 02/28/2015   Procedure: Lower Extremity Angiography;  Surgeon: Algernon Huxley, MD;  Location: [facility];  Service: Cardiovascular;  Laterality: Left;  . PERIPHERAL VASCULAR CATHETERIZATION  02/28/2015   Procedure: Lower Extremity Intervention;  Surgeon: Algernon Huxley, MD;  Location: [facility];  Service: Cardiovascular;;  . PERIPHERAL VASCULAR CATHETERIZATION Left 05/22/2015   Procedure: Lower Extremity Angiography;  Surgeon: Algernon Huxley, MD;  Location: [facility];  Service: Cardiovascular;  Laterality: Left;  . PERIPHERAL VASCULAR CATHETERIZATION  05/22/2015   Procedure: Lower Extremity Intervention;  Surgeon: Algernon Huxley, MD;  Location: [facility];  Service: Cardiovascular;;  . PERIPHERAL VASCULAR CATHETERIZATION Left 05/23/2015   Procedure: Lower Extremity  Angiography;  Surgeon: Algernon Huxley, MD;  Location: [facility];  Service: Cardiovascular;  Laterality: Left;  . PERIPHERAL VASCULAR CATHETERIZATION  05/23/2015   Procedure: Lower Extremity Intervention;  Surgeon: Algernon Huxley, MD;  Location: [facility];  Service: Cardiovascular;;  . stent placement  2006-2014   multiple stent placements in legs    SOCIAL HISTORY:   Social History  Substance Use Topics  . Smoking status: Former Smoker    Packs/day: 1.00    Years: 25.00    Types: Cigarettes  . Smokeless tobacco: Never Used  . Alcohol use No    FAMILY HISTORY:   Family History  Problem Relation Age of Onset  . Heart disease Mother   . Heart disease Father   . Cancer Sister     lung  . Breast cancer Daughter     DRUG ALLERGIES:   Allergies  Allergen Reactions  . Codeine Diarrhea  . Hydrocodone Nausea And Vomiting  . Levofloxacin Other (See Comments)    Muscle cramps  . Tramadol Nausea And Vomiting    REVIEW OF SYSTEMS:   Review of Systems  Constitutional: Positive for chills and fever. Negative for weight loss.  HENT: Negative for congestion, nosebleeds and tinnitus.   Eyes: Negative for blurred vision, double vision and redness.  Respiratory: Positive for cough and shortness of breath. Negative for hemoptysis and sputum production.   Cardiovascular: Negative for chest pain, orthopnea, leg swelling and PND.  Gastrointestinal: Negative for abdominal pain, diarrhea, melena, nausea and vomiting.  Genitourinary: Negative for dysuria, hematuria and urgency.  Musculoskeletal: Negative for falls and joint pain.  Neurological: Negative for dizziness, tingling, sensory change, focal weakness, seizures, weakness and headaches.  Endo/Heme/Allergies: Negative for polydipsia. Does not bruise/bleed easily.  Psychiatric/Behavioral: Negative for depression and memory loss. The patient is not nervous/anxious.     MEDICATIONS AT HOME:   Prior to Admission  medications   Medication Sig Start Date End Date Taking? Authorizing Provider  albuterol (PROVENTIL HFA;VENTOLIN HFA) 108 (90 BASE) MCG/ACT inhaler Inhale 2 puffs into the lungs 2 (two) times daily as needed for wheezing or shortness of breath.    Yes Historical Provider, MD  aspirin EC 325 MG tablet Take 325 mg by mouth at bedtime.    Yes Historical Provider, MD  budesonide (PULMICORT) 180 MCG/ACT inhaler Inhale 2 puffs into the lungs 2 (two) times daily.   Yes Historical Provider, MD  Calcium Carbonate-Vit D-Min (CALCIUM 1200) 1200-1000 MG-UNIT CHEW Chew 1 tablet by mouth daily.   Yes Historical Provider, MD  cholecalciferol (VITAMIN D) 1000 units tablet Take 1,000 Units by mouth every morning.    Yes Historical Provider, MD  clopidogrel (PLAVIX) 75 MG tablet Take 75 mg by mouth daily.   Yes Historical Provider, MD  diltiazem (CARDIZEM CD) 180 MG 24 hr capsule Take 180 mg by mouth every morning.    Yes Historical Provider, MD  docusate sodium (COLACE) 100 MG capsule Take 400 mg by mouth at bedtime.   Yes Historical Provider, MD  Esomeprazole Magnesium (NEXIUM PO)  Take 22.3 mg by mouth 2 (two) times daily.    Yes Historical Provider, MD  ferrous sulfate 325 (65 FE) MG tablet Take 1 tablet (325 mg total) by mouth daily. 05/24/15  Yes Kimberly A Stegmayer, PA-C  furosemide (LASIX) 20 MG tablet Take 20 mg by mouth 2 (two) times daily.   Yes Historical Provider, MD  gabapentin (NEURONTIN) 300 MG capsule Take 300 mg by mouth 2 (two) times daily.   Yes Historical Provider, MD  Melatonin 5 MG TABS Take 5 mg by mouth at bedtime.    Yes Historical Provider, MD  montelukast (SINGULAIR) 10 MG tablet Take 10 mg by mouth every morning.    Yes Historical Provider, MD  oxyCODONE (ROXICODONE) 5 MG immediate release tablet Take 1 tablet (5 mg total) by mouth every 4 (four) hours as needed for severe pain. Patient taking differently: Take 2.5 mg by mouth every 4 (four) hours as needed for severe pain.  08/19/15  Yes  Hessie Knows, MD  oxyCODONE-acetaminophen (PERCOCET/ROXICET) 5-325 MG tablet Take 0.5 tablets by mouth daily as needed for severe pain.    Yes Historical Provider, MD  polyethylene glycol (MIRALAX / GLYCOLAX) packet Take 17 g by mouth daily as needed. Patient taking differently: Take 17 g by mouth 2 (two) times daily. Mix in 8 oz liquid and drink 06/11/15  Yes Loletha Grayer, MD  potassium chloride SA (K-DUR,KLOR-CON) 20 MEQ tablet Take 20-40 mEq by mouth See admin instructions. Take 2 tablets (40 mEq) by mouth every morning, Take 20 mEq by mouth every afternoon, and Take 2 tablets (40 mEq) by mouth every night at bedtime.   Yes Historical Provider, MD  pramipexole (MIRAPEX) 0.5 MG tablet Take 0.5 mg by mouth 2 (two) times daily. 06/30/15  Yes Historical Provider, MD  simvastatin (ZOCOR) 20 MG tablet Take 20 mg by mouth at bedtime. 07/03/15  Yes Historical Provider, MD  temazepam (RESTORIL) 15 MG capsule Take 15 mg by mouth at bedtime as needed for sleep.   Yes Historical Provider, MD  albuterol (PROVENTIL) (2.5 MG/3ML) 0.083% nebulizer solution Take 3 mLs (2.5 mg total) by nebulization every 6 (six) hours as needed for wheezing or shortness of breath. Patient not taking: Reported on 11/19/2015 06/11/15   Loletha Grayer, MD  amoxicillin-clavulanate (AUGMENTIN) 875-125 MG tablet Take 1 tablet by mouth every 12 (twelve) hours. Patient not taking: Reported on 11/19/2015 08/26/15   Fritzi Mandes, MD  predniSONE (DELTASONE) 10 MG tablet Take 1 tablet (10 mg total) by mouth daily. Patient not taking: Reported on 01/30/2016 01/19/16   Charline Bills Cuthriell, PA-C      VITAL SIGNS:  Blood pressure (!) 95/41, pulse 94, temperature 98.1 F (36.7 C), temperature source Oral, resp. rate 20, height 4\' 11"  (1.499 m), weight 63 kg (139 lb), SpO2 92 %.  PHYSICAL EXAMINATION:  Physical Exam  GENERAL:  83 y.o.-year-old patient lying in the bed in no acute distress.  EYES: Pupils equal, round, reactive to light and  accommodation. No scleral icterus. Extraocular muscles intact.  HEENT: Head atraumatic, normocephalic. Oropharynx and nasopharynx clear. No oropharyngeal erythema, moist oral mucosa  NECK:  Supple, no jugular venous distention. No thyroid enlargement, no tenderness.  LUNGS: Prolonged insp. & exp. phase, no wheezing, rales at right base, No rhonchi. No use of accessory muscles of respiration.  CARDIOVASCULAR: S1, S2 RRR. No murmurs, rubs, gallops, clicks.  ABDOMEN: Soft, nontender, nondistended. Bowel sounds present. No organomegaly or mass.  EXTREMITIES: + edema L > R, No cyanosis, or  clubbing. + 2 pedal & radial pulses b/l.  LLE dressing from recent PVD surgery with some open wounds.  Not draining.  NEUROLOGIC: Cranial nerves II through XII are intact. No focal Motor or sensory deficits appreciated b/l. Globally weak PSYCHIATRIC: The patient is alert and oriented x 3. Good affect.  SKIN: No obvious rash, lesion, or ulcer.   LABORATORY PANEL:   CBC  Recent Labs Lab 01/30/16 1340  WBC 22.9*  HGB 11.3*  HCT 35.2  PLT 300   ------------------------------------------------------------------------------------------------------------------  Chemistries   Recent Labs Lab 01/30/16 1340  NA 134*  K 3.3*  CL 96*  CO2 28  GLUCOSE 101*  BUN 33*  CREATININE 1.10*  CALCIUM 9.0  AST 27  ALT 16  ALKPHOS 66  BILITOT 0.4   ------------------------------------------------------------------------------------------------------------------  Cardiac Enzymes No results for input(s): TROPONINI in the last 168 hours. ------------------------------------------------------------------------------------------------------------------  RADIOLOGY:  Dg Chest Portable 1 View  Result Date: 01/30/2016 CLINICAL DATA:  Fever, dyspnea, chills EXAM: PORTABLE CHEST 1 VIEW COMPARISON:  08/24/15 FINDINGS: Cardiomegaly again noted. Large hiatal hernia. Persistent streaky infiltrate/pneumonia right perihilar  and right base. New left basilar atelectasis or infiltrate. IMPRESSION: Large hiatal hernia. Persistent streaky infiltrate/pneumonia right perihilar and right base. New left basilar atelectasis or infiltrate. Follow-up to resolution is recommended. Electronically Signed   By: Lahoma Crocker M.D.   On: 01/30/2016 14:54     IMPRESSION AND PLAN:   83 year old female with past medical history of chronic respiratory failure on oxygen at bedtime, history of pneumonia, hypertension, peripheral vascular disease, restless leg syndrome, GERD who presents to the hospital due to fever, chills, cough and noted to have multifocal pneumonia.  1. Sepsis-patient meets sepsis criteria given fever at home, leukocytosis and chest x-ray findings suggestive pneumonia. -We'll treat the patient with IV vancomycin, Zosyn, follow blood, sputum cultures. Clinically presently hemodynamically stable.  2. Pneumonia-we will treat the patient with IV vancomycin, Zosyn for HCAP.  - follow sputum, Blood cultures.   3. Leukocytosis - due to # 2.  - follow w/ IV abx therapy.   4. Hyperlipidemia - cont. Simvastatin  5. Restless Leg syndrome - cont. Mirapex.    6. GERD - cont. Protonix.   7. Neuropathy - cont. Neurontin  8. Essential HTN - cont. Cardizem.   9. Hx of PVD - cont. Plavix, Statin.    All the records are reviewed and case discussed with ED provider. Management plans discussed with the patient, family and they are in agreement.  CODE STATUS: Full code  TOTAL TIME TAKING CARE OF THIS PATIENT: 45 minutes.    Henreitta Leber M.D on 01/30/2016 at 3:56 PM  Between 7am to 6pm - Pager - [phone]  After 6pm go to [email or web address] - password [email or web address] [facility]  Office  [phone]  CC: Primary care physician; Rusty Aus, MD

## 2016-01-30 NOTE — ED Notes (Signed)
Pt to ed via ems from home with c/o chills and fever since this am, per pt fever was 101.3 at home.  Pt reports she took tylenol,  Pt skin warm and dry.  Pt placed on cm, ekg done.  IV started and labs drawn on arrival.  Pt alert and oriented.  Also reports nonproductive cough and congestion.  +body aches,  Pt sr on monitor with PVC's

## 2016-01-30 NOTE — Progress Notes (Signed)
Pharmacy Antibiotic Note  Natasha [name] is a 83 y.o. female admitted on 01/30/2016 with  pneumonia/HCAP.  Pharmacy has been consulted for Zosyn and Vancomcyin dosing. Patient was given Cefepime 1gm IV x 1 and Vancomcyin 1 gm IV x1 dose in ED.   Plan: Ke: 0.029  T1/2:  23.9  Vd: 44 Will start patient on Vancomycin 750mg  IV every 24 hours with 10 hours stack dosing. Calculated trough at [facility] is 17.  Trough planned prior to 4th dose. MRSA ordered- recommend D/C of vancomycin if negative.  Will start Zosyn 3.375 IV EI every 8 hours.  Pharmacy will continue to monitor renal function and adjust dose as needed.   Height: 4\' 11"  (149.9 cm) Weight: 139 lb (63 kg) IBW/kg (Calculated) : 43.2  Temp (24hrs), Avg:98.1 F (36.7 C), Min:98.1 F (36.7 C), Max:98.1 F (36.7 C)   Recent Labs Lab 01/30/16 1340 01/30/16 1418  WBC 22.9*  --   CREATININE 1.10*  --   LATICACIDVEN  --  1.5    Estimated Creatinine Clearance: 30.2 mL/min (by C-G formula based on SCr of 1.1 mg/dL (H)).    Allergies  Allergen Reactions  . Codeine Diarrhea  . Hydrocodone Nausea And Vomiting  . Levofloxacin Other (See Comments)    Muscle cramps  . Tramadol Nausea And Vomiting    Antimicrobials this admission: 1/19 vancomycin >>  1/19 Zosyn >>   Dose adjustments this admission:   Microbiology results: 1/19  BCx: sent  1/19 MRSA PCR: ordered   Thank you for allowing pharmacy to be a part of this patient's care.  Pernell Dupre, PharmD, BCPS Clinical Pharmacist 01/30/2016 4:03 PM

## 2016-01-30 NOTE — Progress Notes (Signed)
Wound care to left leg.  2.5 x 0.75 to above the knee                                        7 x 2.5   Left calf area                                         1 x 1     Just below the knee  Dressing in place from home. Wrap with an ace wrap.

## 2016-01-30 NOTE — ED Notes (Signed)
Blood cultures x 2 sent to lab, pt with family at bedside.  Saline started per orders, see mar.  Pt resting, VSS at this time.  Pt remains on 4 lpm o2 via , sats 94%.

## 2016-01-30 NOTE — ED Provider Notes (Signed)
[facility] Emergency Department Provider Note   ____________________________________________   First MD Initiated Contact with Patient 01/30/16 1417     (approximate)  I have reviewed the triage vital signs and the nursing notes.   HISTORY  Chief Complaint Chills and Fever    HPI Natasha [name] is a 83 y.o. female having fever for 2 days chills and cough.  Patient reports that she's been feeling somewhat short of breath, been coughing with chills. Cough is productive at times. She is also been under recent treatment for a slow healing laceration over the left leg, which has been healing well without recent infection.  No chest pain. No nausea or vomiting. No abdominal pain.  Patient reports moderate shortness of breath. Feels similar to previous pneumonia which she's had "3 times since February"  Past Medical History:  Diagnosis Date  . Anemia   . Arthritis   . Asthma   . Bilateral pneumonia may 2017  . Bronchitis   . Cardiomegaly   . Chronic respiratory failure with hypoxia ([place])   . Cochlear implant in place    bilateral, [name], 04/26/2007   . Gallstones 2015  . GERD (gastroesophageal reflux disease)   . H/O blood clots 2014  . History of home oxygen therapy    at night  . Hypertension   . Hypokalemia   . Peripheral vascular disease ([place])    with arterial clots- on xarelto  . Polyneuropathy ([place])   . Renal disorder    Chronic Kidney Disease, Stage 5  . Restless leg syndrome     Patient Active Problem List   Diagnosis Date Noted  . Sepsis ([place]) 08/24/2015  . HCAP (healthcare-associated pneumonia) 06/27/2015  . Pneumonia 06/07/2015  . Hypotension 03/11/2015  . Lower extremity atheroembolism ([place]) 02/27/2015  . Ischemic leg 02/27/2015  . Calculus of gallbladder with other cholecystitis, without mention of obstruction 04/20/2013    Past Surgical History:  Procedure Laterality Date  . ABDOMINAL HYSTERECTOMY   1960  . APPENDECTOMY  1946  . BREAST BIOPSY Bilateral    negative  . CHOLECYSTECTOMY  04-24-13  . COCHLEAR IMPLANT  2009  . COLONOSCOPY  2015   Dr. Rayann Heman   . EYE SURGERY  2008,2009   cataract  . KYPHOPLASTY N/A 08/19/2015   Procedure: KYPHOPLASTY;  Surgeon: Hessie Knows, MD;  Location: [facility] ORS;  Service: Orthopedics;  Laterality: N/A;  . PERIPHERAL VASCULAR CATHETERIZATION N/A 02/27/2015   Procedure: Abdominal Aortogram w/Lower Extremity;  Surgeon: Algernon Huxley, MD;  Location: [place] [facility];  Service: Cardiovascular;  Laterality: N/A;  . PERIPHERAL VASCULAR CATHETERIZATION  02/27/2015   Procedure: Lower Extremity Intervention;  Surgeon: Algernon Huxley, MD;  Location: [place] [facility];  Service: Cardiovascular;;  . PERIPHERAL VASCULAR CATHETERIZATION Left 02/28/2015   Procedure: Lower Extremity Angiography;  Surgeon: Algernon Huxley, MD;  Location: [place] [facility];  Service: Cardiovascular;  Laterality: Left;  . PERIPHERAL VASCULAR CATHETERIZATION  02/28/2015   Procedure: Lower Extremity Intervention;  Surgeon: Algernon Huxley, MD;  Location: [place] [facility];  Service: Cardiovascular;;  . PERIPHERAL VASCULAR CATHETERIZATION Left 05/22/2015   Procedure: Lower Extremity Angiography;  Surgeon: Algernon Huxley, MD;  Location: [place] [facility];  Service: Cardiovascular;  Laterality: Left;  . PERIPHERAL VASCULAR CATHETERIZATION  05/22/2015   Procedure: Lower Extremity Intervention;  Surgeon: Algernon Huxley, MD;  Location: [place] [facility];  Service: Cardiovascular;;  . PERIPHERAL VASCULAR CATHETERIZATION Left 05/23/2015  Procedure: Lower Extremity Angiography;  Surgeon: Algernon Huxley, MD;  Location: [place] [facility];  Service: Cardiovascular;  Laterality: Left;  . PERIPHERAL VASCULAR CATHETERIZATION  05/23/2015   Procedure: Lower Extremity Intervention;  Surgeon: Algernon Huxley, MD;  Location: [place] [facility];  Service: Cardiovascular;;  . stent placement  2006-2014   multiple stent  placements in legs    Prior to Admission medications   Medication Sig Start Date End Date Taking? Authorizing Provider  albuterol (PROVENTIL HFA;VENTOLIN HFA) 108 (90 BASE) MCG/ACT inhaler Inhale 2 puffs into the lungs 2 (two) times daily as needed for wheezing or shortness of breath.     Historical Provider, MD  albuterol (PROVENTIL) (2.5 MG/3ML) 0.083% nebulizer solution Take 3 mLs (2.5 mg total) by nebulization every 6 (six) hours as needed for wheezing or shortness of breath. Patient not taking: Reported on 11/19/2015 06/11/15   Loletha Grayer, MD  amoxicillin-clavulanate (AUGMENTIN) 875-125 MG tablet Take 1 tablet by mouth every 12 (twelve) hours. Patient not taking: Reported on 11/19/2015 08/26/15   Fritzi Mandes, MD  aspirin EC 325 MG tablet Take 325 mg by mouth at bedtime.     Historical Provider, MD  budesonide (PULMICORT) 180 MCG/ACT inhaler Inhale 2 puffs into the lungs 2 (two) times daily.    Historical Provider, MD  Calcium Carbonate-Vit D-Min (CALCIUM 1200) 1200-1000 MG-UNIT CHEW Chew 1 tablet by mouth daily.    Historical Provider, MD  cholecalciferol (VITAMIN D) 1000 units tablet Take 1,000 Units by mouth every morning.     Historical Provider, MD  clopidogrel (PLAVIX) 75 MG tablet Take 75 mg by mouth daily.    Historical Provider, MD  diltiazem (CARDIZEM CD) 180 MG 24 hr capsule Take 180 mg by mouth every morning.     Historical Provider, MD  docusate sodium (COLACE) 100 MG capsule Take 400 mg by mouth at bedtime.    Historical Provider, MD  Esomeprazole Magnesium (NEXIUM PO) Take 22.3 mg by mouth 2 (two) times daily.     Historical Provider, MD  ferrous sulfate 325 (65 FE) MG tablet Take 1 tablet (325 mg total) by mouth daily. 05/24/15   Kimberly A Stegmayer, PA-C  furosemide (LASIX) 20 MG tablet Take 20 mg by mouth 2 (two) times daily.    Historical Provider, MD  gabapentin (NEURONTIN) 300 MG capsule Take 300 mg by mouth 2 (two) times daily.    Historical Provider, MD    glucosamine-chondroitin 500-400 MG tablet Take 1 tablet by mouth daily.    Historical Provider, MD  MAGNESIUM PO Take 400 mg by mouth at bedtime.     Historical Provider, MD  Melatonin 5 MG TABS Take 5 mg by mouth at bedtime.     Historical Provider, MD  montelukast (SINGULAIR) 10 MG tablet Take 10 mg by mouth every morning.     Historical Provider, MD  oxyCODONE (ROXICODONE) 5 MG immediate release tablet Take 1 tablet (5 mg total) by mouth every 4 (four) hours as needed for severe pain. 08/19/15   Hessie Knows, MD  oxyCODONE-acetaminophen (PERCOCET/ROXICET) 5-325 MG tablet Take 1 tablet by mouth daily as needed for severe pain.     Historical Provider, MD  polyethylene glycol (MIRALAX / GLYCOLAX) packet Take 17 g by mouth daily as needed. Patient taking differently: Take 17 g by mouth 2 (two) times daily. Mix in 8 oz liquid and drink 06/11/15   Loletha Grayer, MD  potassium chloride SA (K-DUR,KLOR-CON) 20 MEQ tablet Take 20-40 mEq by mouth See  admin instructions. Take 2 tablets (40 mEq) by mouth every morning, Take 20 mEq by mouth every afternoon, and Take 2 tablets (40 mEq) by mouth every night at bedtime.    Historical Provider, MD  pramipexole (MIRAPEX) 0.5 MG tablet Take 0.5 mg by mouth 2 (two) times daily. 06/30/15   Historical Provider, MD  predniSONE (DELTASONE) 10 MG tablet Take 1 tablet (10 mg total) by mouth daily. 01/19/16   Charline Bills Cuthriell, PA-C  rivaroxaban (XARELTO) 20 MG TABS tablet Take 20 mg by mouth every morning.     Historical Provider, MD  simvastatin (ZOCOR) 20 MG tablet Take 20 mg by mouth at bedtime. 07/03/15   Historical Provider, MD  temazepam (RESTORIL) 15 MG capsule Take 15 mg by mouth at bedtime as needed for sleep.    Historical Provider, MD    Allergies Codeine; Hydrocodone; Levofloxacin; and Tramadol  Family History  Problem Relation Age of Onset  . Heart disease Mother   . Heart disease Father   . Cancer Sister     lung  . Breast cancer Daughter      Social History Social History  Substance Use Topics  . Smoking status: Former Smoker    Types: Cigarettes  . Smokeless tobacco: Never Used  . Alcohol use No    Review of Systems - the patient's husband assists with review of systems and history is a patient is very hard of hearing Constitutional: Fevers and fatigue Eyes: No visual changes. ENT: No sore throat. Cardiovascular: Denies chest pain. Respiratory: Shortness of breath, cough Gastrointestinal: No abdominal pain.  No nausea, no vomiting.  No diarrhea.  No constipation. Genitourinary: Negative for dysuria. Musculoskeletal: Negative for back pain. Skin: Negative for rash though she does have 3 slowly healing lacerations over the left leg which are under care by the wound care team. Neurological: Negative for headaches, focal weakness or numbness.  10-point ROS otherwise negative.  ____________________________________________   PHYSICAL EXAM:  VITAL SIGNS: ED Triage Vitals  Enc Vitals Group     BP 01/30/16 1321 (!) 95/41     Pulse Rate 01/30/16 1321 94     Resp 01/30/16 1321 20     Temp 01/30/16 1321 98.1 F (36.7 C)     Temp Source 01/30/16 1321 Oral     SpO2 01/30/16 1321 92 %     Weight 01/30/16 1322 139 lb (63 kg)     Height 01/30/16 1322 4\' 11"  (1.499 m)     Head Circumference --      Peak Flow --      Pain Score 01/30/16 1322 0     Pain Loc --      Pain Edu? --      Excl. in [place]? --    Constitutional: Alert and oriented. Chronically ill, somewhat fatigued but in no distress. She is very pleasant and notably very hard of hearing, generally having to read lips and communicate via writing Eyes: Conjunctivae are normal. PERRL. EOMI. Head: Atraumatic. Nose: No congestion/rhinnorhea. Mouth/Throat: Mucous membranes are moist.  Oropharynx non-erythematous. Neck: No stridor.   Cardiovascular: Normal rate, regular rhythm. Grossly normal heart sounds.  Good peripheral circulation. Respiratory: Mild use of the  base accessory muscles. No wheezing. The patient has rales noted in the lower lobes bilaterally. She speaks in phrases, and appears mildly dyspneic. Gastrointestinal: Soft and nontender. No distention.  Musculoskeletal: No lower extremity tenderness in the left lower extremity has a couple of bandage linear wounds from a previous laceration. They  appear to be clean at the base with no evidence of superinfection, the largest being over the left who her leg that probably over 8 cm in size No joint effusions. Neurologic:  Normal speech and language. No gross focal neurologic deficits are appreciated.Skin:  Skin is warm, dry and intact. No rash noted. Psychiatric: Mood and affect are normal. Speech and behavior are normal.  ____________________________________________   LABS (all labs ordered are listed, but only abnormal results are displayed)  Labs Reviewed  CBC WITH DIFFERENTIAL/PLATELET - Abnormal; Notable for the following:       Result Value   WBC 22.9 (*)    Hemoglobin 11.3 (*)    RDW 17.3 (*)    Neutro Abs 20.9 (*)    All other components within normal limits  COMPREHENSIVE METABOLIC PANEL - Abnormal; Notable for the following:    Sodium 134 (*)    Potassium 3.3 (*)    Chloride 96 (*)    Glucose, Bld 101 (*)    BUN 33 (*)    Creatinine, Ser 1.10 (*)    Total Protein 6.2 (*)    Albumin 3.4 (*)    GFR calc non Af Amer 44 (*)    GFR calc Af Amer 52 (*)    All other components within normal limits  CULTURE, BLOOD (ROUTINE X 2)  CULTURE, BLOOD (ROUTINE X 2)  INFLUENZA PANEL BY PCR (TYPE A & B)  LIPASE, BLOOD  LACTIC ACID, PLASMA  LACTIC ACID, PLASMA   ____________________________________________  EKG  Reviewed and interpreted by me at 1345 In heart rate 90 PR 160 QTc 4:30 Normal sinus rhythm, occasional PVC, no evidence of acute ischemia ____________________________________________  RADIOLOGY    Dg Chest Portable 1 View  Result Date: 01/30/2016 CLINICAL DATA:   Fever, dyspnea, chills EXAM: PORTABLE CHEST 1 VIEW COMPARISON:  08/24/15 FINDINGS: Cardiomegaly again noted. Large hiatal hernia. Persistent streaky infiltrate/pneumonia right perihilar and right base. New left basilar atelectasis or infiltrate. IMPRESSION: Large hiatal hernia. Persistent streaky infiltrate/pneumonia right perihilar and right base. New left basilar atelectasis or infiltrate. Follow-up to resolution is recommended. Electronically Signed   By: Lahoma Crocker M.D.   On: 01/30/2016 14:54    ____________________________________________   PROCEDURES  Procedure(s) performed: None  Procedures  Critical Care performed: No  ____________________________________________   INITIAL IMPRESSION / ASSESSMENT AND PLAN / ED COURSE  Pertinent labs & imaging results that were available during my care of the patient were reviewed by me and considered in my medical decision making (see chart for details).  Distress as fever or cough shortness of breath and bilateral rales. Her clinical examination seems most consistent with bilateral lower lobe pneumonia, given the patient's previous history I'm concerned this could represent a healthcare associated pneumonia.  ----------------------------------------- 2:43 PM on 01/30/2016 -----------------------------------------   Patient remained clinically stable. Chest x-ray reveals bilateral infiltrates, appears consistent with her symptomatology clean productive cough shortness of breath and reported fevers at home. At this point, given the patient's history I will treat her for a possible healthcare associated pneumonia.  Patient agreeable with plan for admission along with her husband.     ____________________________________________   FINAL CLINICAL IMPRESSION(S) / ED DIAGNOSES  Final diagnoses:  Pneumonia of both lower lobes due to infectious organism      NEW MEDICATIONS STARTED DURING THIS VISIT:  New Prescriptions   No medications  on file     Note:  This document was prepared using Dragon voice recognition software and may include  unintentional dictation errors.     Delman Kitten, MD 01/30/16 1500

## 2016-01-31 LAB — CBC
HEMATOCRIT: 30.2 % — AB (ref 35.0–47.0)
Hemoglobin: 9.9 g/dL — ABNORMAL LOW (ref 12.0–16.0)
MCH: 28.7 pg (ref 26.0–34.0)
MCHC: 32.7 g/dL (ref 32.0–36.0)
MCV: 87.9 fL (ref 80.0–100.0)
PLATELETS: 251 10*3/uL (ref 150–440)
RBC: 3.43 MIL/uL — ABNORMAL LOW (ref 3.80–5.20)
RDW: 18 % — AB (ref 11.5–14.5)
WBC: 17.2 10*3/uL — ABNORMAL HIGH (ref 3.6–11.0)

## 2016-01-31 LAB — BASIC METABOLIC PANEL
Anion gap: 7 (ref 5–15)
BUN: 24 mg/dL — AB (ref 6–20)
CO2: 28 mmol/L (ref 22–32)
Calcium: 8 mg/dL — ABNORMAL LOW (ref 8.9–10.3)
Chloride: 101 mmol/L (ref 101–111)
Creatinine, Ser: 0.88 mg/dL (ref 0.44–1.00)
GFR calc Af Amer: >60 mL/min (ref >60)
GFR, EST NON AFRICAN AMERICAN: 58 mL/min — AB (ref >60)
GLUCOSE: 107 mg/dL — AB (ref 65–99)
POTASSIUM: 2.7 mmol/L — AB (ref 3.5–5.1)
Sodium: 136 mmol/L (ref 135–145)

## 2016-01-31 MED ORDER — ATORVASTATIN CALCIUM 10 MG PO TABS
10.0000 mg | ORAL_TABLET | Freq: Every day | ORAL | Status: DC
  Administered 2016-01-31 – 2016-02-01 (×2): 10 mg via ORAL
  Filled 2016-01-31 (×2): qty 1

## 2016-01-31 MED ORDER — SODIUM CHLORIDE 0.9 % IV BOLUS (SEPSIS)
500.0000 mL | Freq: Once | INTRAVENOUS | Status: AC
  Administered 2016-01-31: 500 mL via INTRAVENOUS

## 2016-01-31 MED ORDER — AZITHROMYCIN 250 MG PO TABS
250.0000 mg | ORAL_TABLET | Freq: Every day | ORAL | Status: DC
  Administered 2016-02-01 – 2016-02-02 (×2): 250 mg via ORAL
  Filled 2016-01-31 (×2): qty 1

## 2016-01-31 MED ORDER — POTASSIUM CHLORIDE CRYS ER 20 MEQ PO TBCR
40.0000 meq | EXTENDED_RELEASE_TABLET | Freq: Two times a day (BID) | ORAL | Status: DC
  Administered 2016-01-31 (×2): 40 meq via ORAL
  Filled 2016-01-31 (×2): qty 2

## 2016-01-31 MED ORDER — MAGNESIUM SULFATE 2 GM/50ML IV SOLN
2.0000 g | Freq: Once | INTRAVENOUS | Status: AC
  Administered 2016-01-31: 2 g via INTRAVENOUS
  Filled 2016-01-31: qty 50

## 2016-01-31 MED ORDER — FUROSEMIDE 20 MG PO TABS
20.0000 mg | ORAL_TABLET | Freq: Two times a day (BID) | ORAL | Status: DC
Start: 2016-02-01 — End: 2016-02-02
  Administered 2016-02-01 – 2016-02-02 (×3): 20 mg via ORAL
  Filled 2016-01-31 (×3): qty 1

## 2016-01-31 MED ORDER — AZITHROMYCIN 500 MG PO TABS
500.0000 mg | ORAL_TABLET | Freq: Every day | ORAL | Status: AC
  Administered 2016-01-31: 500 mg via ORAL
  Filled 2016-01-31: qty 1

## 2016-01-31 NOTE — Progress Notes (Signed)
Pharmacist - Prescriber Communication  Simvastatin 20 mg po daily changed to atorvastatin 10 mg po daily to reduce risk of drug-drug interaction with diltiazem.  Autry Prust A. [place], [place].D., BCPS Clinical Pharmacist 01/31/2016 [phone]

## 2016-01-31 NOTE — Progress Notes (Signed)
Sitting up in chair, ambulating with rolling walker, no complaints of pain, continues on iv.

## 2016-01-31 NOTE — Progress Notes (Signed)
PT Cancellation Note  Patient Details Name: Natasha [name] MRN: [id number] DOB: 01-15-1928   Cancelled Treatment:    Reason Eval/Treat Not Completed: Medical issues which prohibited therapy. After performing chart review, it is determined that mobility assessment is contraindicated at this time due to K+ levels being 2.7. Will check back at later time if time permits to determine if medically appropriate.   Dorice Lamas, PT, DPT 01/31/2016, 2:19 PM

## 2016-01-31 NOTE — Progress Notes (Signed)
Patient ID: Natasha [name], female   DOB: 04/08/1928, 83 y.o.   MRN: [id number]  [employer] PROGRESS NOTE  Natasha [name] SHF:[id number] DOB: [birth year] DOA: 01/30/2016 PCP: Rusty Aus, MD  HPI/Subjective: Patient states that this is the fourth time she's had pneumonia and a year. She's feeling better than when she came in.  Objective: Vitals:   01/31/16 1022 01/31/16 1330  BP: 95/73 (!) 96/47  Pulse: 85 77  Resp: 17 18  Temp: 98.3 F (36.8 C) 98.7 F (37.1 C)    Filed Weights   01/30/16 1322 01/30/16 1825  Weight: 63 kg (139 lb) 63 kg (139 lb)    ROS: Review of Systems  Constitutional: Negative for chills and fever.  Eyes: Negative for blurred vision.  Respiratory: Positive for cough and shortness of breath.   Cardiovascular: Negative for chest pain.  Gastrointestinal: Negative for abdominal pain, constipation, diarrhea, nausea and vomiting.  Genitourinary: Negative for dysuria.  Musculoskeletal: Negative for joint pain.  Neurological: Negative for dizziness and headaches.   Exam: Physical Exam  Constitutional: She is oriented to person, place, and time.  HENT:  Nose: No mucosal edema.  Mouth/Throat: No oropharyngeal exudate or posterior oropharyngeal edema.  Eyes: Conjunctivae, EOM and lids are normal. Pupils are equal, round, and reactive to light.  Neck: No JVD present. Carotid bruit is not present. No edema present. No thyroid mass and no thyromegaly present.  Cardiovascular: S1 normal and S2 normal.  Exam reveals no gallop.   No murmur heard. Pulses:      Dorsalis pedis pulses are 2+ on the right side, and 2+ on the left side.  Respiratory: No respiratory distress. She has no wheezes. She has rhonchi in the right lower field and the left lower field. She has no rales.  GI: Soft. Bowel sounds are normal. There is no tenderness.  Musculoskeletal:       Right ankle: She exhibits no swelling.       Left ankle: She exhibits no swelling.   Lymphadenopathy:    She has no cervical adenopathy.  Neurological: She is alert and oriented to person, place, and time. No cranial nerve deficit.  Skin: Skin is warm. Nails show no clubbing.  Left leg to open spots from previous surgery. On the calf in the larger area that's open. No signs of infection.  Psychiatric: She has a normal mood and affect.      Data Reviewed: Basic Metabolic Panel:  Recent Labs Lab 01/30/16 1340 01/31/16 0318  NA 134* 136  K 3.3* 2.7*  CL 96* 101  CO2 28 28  GLUCOSE 101* 107*  BUN 33* 24*  CREATININE 1.10* 0.88  CALCIUM 9.0 8.0*   Liver Function Tests:  Recent Labs Lab 01/30/16 1340  AST 27  ALT 16  ALKPHOS 66  BILITOT 0.4  PROT 6.2*  ALBUMIN 3.4*    Recent Labs Lab 01/30/16 1340  LIPASE 26   CBC:  Recent Labs Lab 01/30/16 1340 01/31/16 0318  WBC 22.9* 17.2*  NEUTROABS 20.9*  --   HGB 11.3* 9.9*  HCT 35.2 30.2*  MCV 87.3 87.9  PLT 300 251   BNP (last 3 results)  Recent Labs  06/07/15 1551 08/04/15 1251  BNP 81.0 343.1*     Recent Results (from the past 240 hour(s))  Culture, blood (Routine X 2) w Reflex to ID Panel     Status: None (Preliminary result)   Collection Time: 01/30/16  2:18 PM  Result Value Ref Range  Status   Specimen Description BLOOD LEFT ASSIST CONTROL  Final   Special Requests   Final    BOTTLES DRAWN AEROBIC AND ANAEROBIC ANA10ML AER11ML   Culture NO GROWTH < 24 HOURS  Final   Report Status PENDING  Incomplete  Culture, blood (Routine X 2) w Reflex to ID Panel     Status: None (Preliminary result)   Collection Time: 01/30/16  2:18 PM  Result Value Ref Range Status   Specimen Description BLOOD RIGHT FOREARM  Final   Special Requests BOTTLES DRAWN AEROBIC AND ANAEROBIC AER11ML ANA9ML  Final   Culture NO GROWTH < 24 HOURS  Final   Report Status PENDING  Incomplete  MRSA PCR Screening     Status: None   Collection Time: 01/30/16  7:03 PM  Result Value Ref Range Status   MRSA by PCR  NEGATIVE NEGATIVE Final    Comment:        The GeneXpert MRSA Assay (FDA approved for NASAL specimens only), is one component of a comprehensive MRSA colonization surveillance program. It is not intended to diagnose MRSA infection nor to guide or monitor treatment for MRSA infections.      Studies: Dg Chest Portable 1 View  Result Date: 01/30/2016 CLINICAL DATA:  Fever, dyspnea, chills EXAM: PORTABLE CHEST 1 VIEW COMPARISON:  08/24/15 FINDINGS: Cardiomegaly again noted. Large hiatal hernia. Persistent streaky infiltrate/pneumonia right perihilar and right base. New left basilar atelectasis or infiltrate. IMPRESSION: Large hiatal hernia. Persistent streaky infiltrate/pneumonia right perihilar and right base. New left basilar atelectasis or infiltrate. Follow-up to resolution is recommended. Electronically Signed   By: Lahoma Crocker M.D.   On: 01/30/2016 14:54    Scheduled Meds: . aspirin EC  325 mg Oral QHS  . atorvastatin  10 mg Oral q1800  . [START ON 02/01/2016] azithromycin  250 mg Oral Daily  . budesonide (PULMICORT) nebulizer solution  0.5 mg Nebulization BID  . calcium carbonate  2 tablet Oral Daily  . cholecalciferol  1,000 Units Oral BH-q7a  . clopidogrel  75 mg Oral Daily  . docusate sodium  400 mg Oral QHS  . enoxaparin (LOVENOX) injection  30 mg Subcutaneous Q24H  . ferrous sulfate  325 mg Oral Daily  . [START ON 02/01/2016] furosemide  20 mg Oral BID  . gabapentin  300 mg Oral BID  . Melatonin  5 mg Oral QHS  . montelukast  10 mg Oral BH-q7a  . pantoprazole  40 mg Oral Daily  . piperacillin-tazobactam (ZOSYN)  IV  3.375 g Intravenous Q8H  . potassium chloride SA  40 mEq Oral BID  . potassium chloride  40 mEq Oral BID  . pramipexole  0.5 mg Oral BID   Assessment/Plan:   1. Clinical sepsis with bilateral pneumonia, fever, leukocytosis. Since MRSA PCR is negative I will discontinue vancomycin. Continue Zosyn and add atypical coverage with Zithromax. 2. Hypokalemia.  Replace magnesium IV and potassium orally. 3. Relative hypotension hold Cardizem and Lasix at this time. 4. Open wound left leg. Continue local wound care 5. Hyperlipidemia unspecified on simvastatin 6. Restless leg syndrome on Mirapex 7. GERD on Protonix 8. Neuropathy on Neurontin 9. History of peripheral vascular disease on Plavix and statin 10. Weakness. Physical therapy evaluation  Code Status:     Code Status Orders        Start     Ordered   01/30/16 1807  Full code  Continuous     01/30/16 1806    Code Status History  Date Active Date Inactive Code Status Order ID Comments User Context   08/24/2015  9:16 PM 08/25/2015 10:24 AM Full Code [id number]  Gladstone Lighter, MD Inpatient   06/27/2015  4:42 PM 06/30/2015  4:16 PM Partial Code [id number]  Bettey Costa, MD Inpatient   06/07/2015  8:08 PM 06/11/2015  7:26 PM Full Code [id number]  Fritzi Mandes, MD Inpatient   05/22/2015  1:52 PM 05/24/2015  4:12 PM Full Code [id number]  Algernon Huxley, MD Inpatient   03/11/2015  6:20 PM 03/16/2015  2:15 PM Full Code [id number]  Fritzi Mandes, MD Inpatient   02/27/2015  3:53 PM 03/03/2015  6:24 PM Full Code [id number]  Sela Hua, PA-C Inpatient    Advance Directive Documentation   Flowsheet Row Most Recent Value  Type of Advance Directive  Healthcare Power of Attorney, Living will  Pre-existing out of facility DNR order (yellow form or pink MOST form)  No data  "MOST" Form in Place?  No data      Disposition Plan: Home  Antibiotics:  Zosyn  Zithromax  Time spent: 28 minutes  Loletha Grayer  [employer]

## 2016-02-01 LAB — BASIC METABOLIC PANEL
ANION GAP: 6 (ref 5–15)
BUN: 13 mg/dL (ref 6–20)
CHLORIDE: 106 mmol/L (ref 101–111)
CO2: 28 mmol/L (ref 22–32)
Calcium: 8.1 mg/dL — ABNORMAL LOW (ref 8.9–10.3)
Creatinine, Ser: 0.81 mg/dL (ref 0.44–1.00)
GFR calc Af Amer: >60 mL/min (ref >60)
GLUCOSE: 104 mg/dL — AB (ref 65–99)
POTASSIUM: 3.6 mmol/L (ref 3.5–5.1)
Sodium: 140 mmol/L (ref 135–145)

## 2016-02-01 LAB — MAGNESIUM: MAGNESIUM: 2 mg/dL (ref 1.7–2.4)

## 2016-02-01 MED ORDER — MAGNESIUM HYDROXIDE 400 MG/5ML PO SUSP
30.0000 mL | Freq: Four times a day (QID) | ORAL | Status: DC | PRN
Start: 2016-02-01 — End: 2016-02-02
  Administered 2016-02-01: 30 mL via ORAL
  Filled 2016-02-01: qty 30

## 2016-02-01 MED ORDER — BETAMETHASONE SOD PHOS & ACET 6 (3-3) MG/ML IJ SUSP: 3.0000 mg | Freq: Once | INTRAMUSCULAR | Status: DC

## 2016-02-01 MED ORDER — ENOXAPARIN SODIUM 40 MG/0.4ML ~~LOC~~ SOLN
40.0000 mg | SUBCUTANEOUS | Status: DC
  Administered 2016-02-01: 40 mg via SUBCUTANEOUS
  Filled 2016-02-01: qty 0.4

## 2016-02-01 MED ORDER — SENNA 8.6 MG PO TABS: 1.0000 | ORAL_TABLET | Freq: Two times a day (BID) | ORAL | Status: DC | PRN

## 2016-02-01 MED ORDER — POTASSIUM CHLORIDE CRYS ER 20 MEQ PO TBCR
20.0000 meq | EXTENDED_RELEASE_TABLET | Freq: Two times a day (BID) | ORAL | Status: DC
  Administered 2016-02-01 – 2016-02-02 (×3): 20 meq via ORAL
  Filled 2016-02-01 (×3): qty 1

## 2016-02-01 NOTE — Progress Notes (Signed)
   Subjective:  Patient presents today with a new complaint of pain and tenderness to the right forefoot. Patient states that she went to the ER last week for 7 foot pain. Patient denies trauma. Patient was given prednisone in the emergency department and instructed to follow up in 1 week if there is no improvement. Patient was also given a immobilization boots in the emergency department and she states that she try to wear it however the providing no relief.    Objective/Physical Exam General: The patient is alert and oriented x3 in no acute distress.  Dermatology: Skin is warm, dry and supple bilateral lower extremities. Negative for open lesions or macerations.  Vascular: Palpable pedal pulses bilaterally. No edema or erythema noted. Capillary refill within normal limits.  Neurological: Epicritic and protective threshold grossly intact bilaterally.   Musculoskeletal Exam: Severe pain on palpation noted to the sesamoid apparatus of the right foot consistent with sesamoiditis with possible fracture. Range of motion within normal limits to all pedal and ankle joints bilateral. Muscle strength 5/5 in all groups bilateral.   Radiographic Exam:  Suspect for tibial sesamoid fracture versus bipartite sesamoid right foot   Assessment: #1 sesamoiditis with possible tibial sesamoid fracture right #2 edema first MPJ right foot   Plan of Care:  #1 Patient was evaluated. #2 injection 0.5 mL Celestone Soluspan injected in the patient's sesamoidal apparatus right foot #3 continue prednisone #4 offloading metatarsal pads dispensed today #5 postoperative shoe dispensed #6 return to clinic in 4 weeks   [name], DPM [facility]  Dr. [name], [address]                                        [place], [place] [address]                Office [phone]  Fax [phone]

## 2016-02-01 NOTE — Progress Notes (Signed)
Physical Therapy Evaluation Patient Details Name: Natasha [name] MRN: [id number] DOB: 26-Jul-1928 Today's Date: 02/01/2016   History of Present Illness  Patient is an 83 y.o. female admitted on 65 JAN from [place] at [facility] with pneumonia. PMH includes asthma, PVD, HTN, chronic respiratory failure, and home oxygen at night. Patient reports L LE bypass surgery in OCT 2017 and subsequent fxs in R foot (unable to recall bones) with admittedly no WB precautions.  Clinical Impression  Patient is a pleasant 83 y.o. Female who is a great historian and slightly tearful during evaluation. Upon PT presenting for evaluation, patient was not wearing nasal canula, with resting O2 86%. Patient educated about wearing O2 to keep saturations WNL, explaining she may not be at baseline level of function. Patient demonstrates modified independence with transfers and gait with CGA. O2 saturations dropped from 96% to 92% while on 2L O2. Patient does use home O2 when sleeping, but at this point, she is requiring it at rest. Patient may need home O2 24 hours/day, depending on improvements noted throughout hospital stay. Patient will continue to benefit from progressive PT to improve cardiopulmonary endurance and muscular strength to return her to PLOF.    Follow Up Recommendations Home health PT    Equipment Recommendations       Recommendations for Other Services       Precautions / Restrictions Precautions Precautions: Fall Restrictions Weight Bearing Restrictions: No      Mobility  Bed Mobility                  Transfers Overall transfer level: Modified independent Equipment used: Rolling walker (2 wheeled)             General transfer comment: Patient moves from sit to stand and stand to sit, demonstrating good safety awareness and proper sequencing.  Ambulation/Gait Ambulation/Gait assistance: Min guard Ambulation Distance (Feet): 30 Feet Assistive device: Rolling walker (2  wheeled)       General Gait Details: Patient ambulates at decreased cadence with RW, complaining that it is too low. PT explained that it had been fitted properly at wrist height. Patient's oxygen saturations dropped from 96% to 92% on 2L O2.  Stairs            Wheelchair Mobility    Modified Rankin (Stroke Patients Only)       Balance Overall balance assessment: Needs assistance Sitting-balance support: Feet supported Sitting balance-Leahy Scale: Good     Standing balance support: Bilateral upper extremity supported Standing balance-Leahy Scale: Good                               Pertinent Vitals/Pain Pain Assessment: No/denies pain    Home Living Family/patient expects to be discharged to:: Assisted living               Home Equipment: Walker - 2 wheels      Prior Function Level of Independence: Independent with assistive device(s)         Comments: Patient lives at home with husband. Reports driving prior to L LE bypass surgery.     Hand Dominance        Extremity/Trunk Assessment   Upper Extremity Assessment Upper Extremity Assessment: Generalized weakness    Lower Extremity Assessment Lower Extremity Assessment: Generalized weakness;LLE deficits/detail LLE Deficits / Details: L LE wrapped due to bypass surgery       Communication   Communication: [facility]  Cognition Arousal/Alertness: Awake/alert Behavior During Therapy: WFL for tasks assessed/performed Overall Cognitive Status: Within Functional Limits for tasks assessed                      General Comments      Exercises     Assessment/Plan    PT Assessment Patient needs continued PT services  PT Problem List Decreased strength;Decreased activity tolerance;Decreased balance;Decreased mobility;Decreased safety awareness;Cardiopulmonary status limiting activity;Decreased knowledge of use of DME          PT Treatment Interventions DME instruction;Gait  training;Functional mobility training;Therapeutic activities;Therapeutic exercise;Balance training;Patient/family education    PT Goals (Current goals can be found in the Care Plan section)  Acute Rehab PT Goals Patient Stated Goal: "To feel better" PT Goal Formulation: With patient Time For Goal Achievement: 02/15/16 Potential to Achieve Goals: Good    Frequency Min 2X/week   Barriers to discharge        Co-evaluation               End of Session Equipment Utilized During Treatment: Gait belt;Oxygen Activity Tolerance: Patient tolerated treatment well;Patient limited by fatigue Patient left: in chair;with call bell/phone within reach;with chair alarm set           Time: 1240-1309 PT Time Calculation (min) (ACUTE ONLY): 29 min   Charges:   PT Evaluation $PT Eval Low Complexity: 1 Procedure     PT G Codes:        Dorice Lamas, PT, DPT 02/01/2016, 2:25 PM

## 2016-02-01 NOTE — Progress Notes (Signed)
Pt requesting something to help her move her bowels. She has miralax ordered, but states that it does not work for her. She is requesting either sennakot or milk of magnesia to help her have a bowel movement. Dr. Earleen Newport paged, verbal order for senna 1 tab PO BID PRN and milk of magnesia 26ml PO Q 6 hours PRN received.

## 2016-02-01 NOTE — Progress Notes (Signed)
Pharmacist - Prescriber Communication  Enoxaparin dose modified to 40 mg subcutaneously once daily for creatinine clearance > 30 mL/min.  Natasha [name], [place].D., BCPS Clinical Pharmacist 02/01/2016 [phone]

## 2016-02-01 NOTE — Progress Notes (Signed)
Patient ID: Natasha [name], female   DOB: 1928/09/06, 83 y.o.   MRN: [id number]   [employer] PROGRESS NOTE  Natasha [name] PPJ:[id number] DOB: 1928/05/26 DOA: 01/30/2016 PCP: Rusty Aus, MD  HPI/Subjective: Patient feeling miserable today with runny nose and shortness of breath, sneezing and chest congestion  Objective: Vitals:   02/01/16 1120 02/01/16 1342  BP: (!) 115/48 102/90  Pulse: 77 83  Resp:    Temp: 97.2 F (36.2 C) 97.7 F (36.5 C)    Filed Weights   01/30/16 1322 01/30/16 1825  Weight: 63 kg (139 lb) 63 kg (139 lb)    ROS: Review of Systems  Constitutional: Negative for chills and fever.  HENT: Positive for congestion.   Eyes: Negative for blurred vision.  Respiratory: Positive for cough and shortness of breath.   Cardiovascular: Negative for chest pain.  Gastrointestinal: Negative for abdominal pain, constipation, diarrhea, nausea and vomiting.  Genitourinary: Negative for dysuria.  Musculoskeletal: Negative for joint pain.  Neurological: Positive for weakness. Negative for dizziness and headaches.   Exam: Physical Exam  Constitutional: She is oriented to person, place, and time.  HENT:  Nose: No mucosal edema.  Mouth/Throat: No oropharyngeal exudate or posterior oropharyngeal edema.  Eyes: Conjunctivae, EOM and lids are normal. Pupils are equal, round, and reactive to light.  Neck: No JVD present. Carotid bruit is not present. No edema present. No thyroid mass and no thyromegaly present.  Cardiovascular: S1 normal and S2 normal.  Exam reveals no gallop.   No murmur heard. Pulses:      Dorsalis pedis pulses are 2+ on the right side, and 2+ on the left side.  Respiratory: No respiratory distress. She has no wheezes. She has rhonchi in the right lower field and the left lower field. She has no rales.  GI: Soft. Bowel sounds are normal. There is no tenderness.  Musculoskeletal:       Right ankle: She exhibits no swelling.       Left  ankle: She exhibits no swelling.  Lymphadenopathy:    She has no cervical adenopathy.  Neurological: She is alert and oriented to person, place, and time. No cranial nerve deficit.  Skin: Skin is warm. Nails show no clubbing.  Left leg to open spots from previous surgery. On the calf in the larger area that's open. No signs of infection.  Psychiatric: She has a normal mood and affect.      Data Reviewed: Basic Metabolic Panel:  Recent Labs Lab 01/30/16 1340 01/31/16 0318 02/01/16 0348  NA 134* 136 140  K 3.3* 2.7* 3.6  CL 96* 101 106  CO2 28 28 28   GLUCOSE 101* 107* 104*  BUN 33* 24* 13  CREATININE 1.10* 0.88 0.81  CALCIUM 9.0 8.0* 8.1*  MG  --   --  2.0   Liver Function Tests:  Recent Labs Lab 01/30/16 1340  AST 27  ALT 16  ALKPHOS 66  BILITOT 0.4  PROT 6.2*  ALBUMIN 3.4*    Recent Labs Lab 01/30/16 1340  LIPASE 26   CBC:  Recent Labs Lab 01/30/16 1340 01/31/16 0318  WBC 22.9* 17.2*  NEUTROABS 20.9*  --   HGB 11.3* 9.9*  HCT 35.2 30.2*  MCV 87.3 87.9  PLT 300 251   BNP (last 3 results)  Recent Labs  06/07/15 1551 08/04/15 1251  BNP 81.0 343.1*     Recent Results (from the past 240 hour(s))  Culture, blood (Routine X 2) w Reflex to ID  Panel     Status: None (Preliminary result)   Collection Time: 01/30/16  2:18 PM  Result Value Ref Range Status   Specimen Description BLOOD LEFT ASSIST CONTROL  Final   Special Requests   Final    BOTTLES DRAWN AEROBIC AND ANAEROBIC ANA10ML AER11ML   Culture NO GROWTH 2 DAYS  Final   Report Status PENDING  Incomplete  Culture, blood (Routine X 2) w Reflex to ID Panel     Status: None (Preliminary result)   Collection Time: 01/30/16  2:18 PM  Result Value Ref Range Status   Specimen Description BLOOD RIGHT FOREARM  Final   Special Requests BOTTLES DRAWN AEROBIC AND ANAEROBIC AER11ML ANA9ML  Final   Culture NO GROWTH 2 DAYS  Final   Report Status PENDING  Incomplete  MRSA PCR Screening     Status: None    Collection Time: 01/30/16  7:03 PM  Result Value Ref Range Status   MRSA by PCR NEGATIVE NEGATIVE Final    Comment:        The GeneXpert MRSA Assay (FDA approved for NASAL specimens only), is one component of a comprehensive MRSA colonization surveillance program. It is not intended to diagnose MRSA infection nor to guide or monitor treatment for MRSA infections.      Studies: Dg Chest Portable 1 View  Result Date: 01/30/2016 CLINICAL DATA:  Fever, dyspnea, chills EXAM: PORTABLE CHEST 1 VIEW COMPARISON:  08/24/15 FINDINGS: Cardiomegaly again noted. Large hiatal hernia. Persistent streaky infiltrate/pneumonia right perihilar and right base. New left basilar atelectasis or infiltrate. IMPRESSION: Large hiatal hernia. Persistent streaky infiltrate/pneumonia right perihilar and right base. New left basilar atelectasis or infiltrate. Follow-up to resolution is recommended. Electronically Signed   By: Lahoma Crocker M.D.   On: 01/30/2016 14:54    Scheduled Meds: . aspirin EC  325 mg Oral QHS  . atorvastatin  10 mg Oral q1800  . azithromycin  250 mg Oral Daily  . budesonide (PULMICORT) nebulizer solution  0.5 mg Nebulization BID  . calcium carbonate  2 tablet Oral Daily  . cholecalciferol  1,000 Units Oral BH-q7a  . clopidogrel  75 mg Oral Daily  . docusate sodium  400 mg Oral QHS  . enoxaparin (LOVENOX) injection  40 mg Subcutaneous Q24H  . ferrous sulfate  325 mg Oral Daily  . furosemide  20 mg Oral BID  . gabapentin  300 mg Oral BID  . Melatonin  5 mg Oral QHS  . montelukast  10 mg Oral BH-q7a  . pantoprazole  40 mg Oral Daily  . piperacillin-tazobactam (ZOSYN)  IV  3.375 g Intravenous Q8H  . potassium chloride  20 mEq Oral BID  . pramipexole  0.5 mg Oral BID   Assessment/Plan:   1. Clinical sepsis with bilateral pneumonia, fever, leukocytosis. Since MRSA PCR is negative I will discontinue vancomycin. Continue Zosyn and Zithromax. 2. Hypokalemia. Replaced 3. Relative  hypotension hold Cardizem, and restart Lasix 4. Open wound left leg. Continue local wound care 5. Hyperlipidemia unspecified on simvastatin 6. Restless leg syndrome on Mirapex 7. GERD on Protonix 8. Neuropathy on Neurontin 9. History of peripheral vascular disease on Plavix and statin 10. Weakness. Physical therapy evaluation  Code Status:     Code Status Orders        Start     Ordered   01/30/16 1807  Full code  Continuous     01/30/16 1806    Code Status History    Date Active Date Inactive Code Status  Order ID Comments User Context   08/24/2015  9:16 PM 08/25/2015 10:24 AM Full Code [id number]  Gladstone Lighter, MD Inpatient   06/27/2015  4:42 PM 06/30/2015  4:16 PM Partial Code [id number]  Bettey Costa, MD Inpatient   06/07/2015  8:08 PM 06/11/2015  7:26 PM Full Code [id number]  Fritzi Mandes, MD Inpatient   05/22/2015  1:52 PM 05/24/2015  4:12 PM Full Code [id number]  Algernon Huxley, MD Inpatient   03/11/2015  6:20 PM 03/16/2015  2:15 PM Full Code [id number]  Fritzi Mandes, MD Inpatient   02/27/2015  3:53 PM 03/03/2015  6:24 PM Full Code [id number]  Sela Hua, PA-C Inpatient    Advance Directive Documentation   Flowsheet Row Most Recent Value  Type of Advance Directive  Healthcare Power of Attorney, Living will  Pre-existing out of facility DNR order (yellow form or pink MOST form)  No data  "MOST" Form in Place?  No data      Disposition Plan: Home once breathing better  Antibiotics:  Zosyn  Zithromax  Time spent: 26 minutes  Loletha Grayer  [employer]

## 2016-02-02 ENCOUNTER — Ambulatory Visit: Payer: Medicare Other | Admitting: Surgery

## 2016-02-02 LAB — BASIC METABOLIC PANEL
Anion gap: 6 (ref 5–15)
BUN: 11 mg/dL (ref 6–20)
CO2: 31 mmol/L (ref 22–32)
Calcium: 8.6 mg/dL — ABNORMAL LOW (ref 8.9–10.3)
Chloride: 100 mmol/L — ABNORMAL LOW (ref 101–111)
Creatinine, Ser: 0.7 mg/dL (ref 0.44–1.00)
GFR calc Af Amer: >60 mL/min (ref >60)
Glucose, Bld: 104 mg/dL — ABNORMAL HIGH (ref 65–99)
POTASSIUM: 3.7 mmol/L (ref 3.5–5.1)
Sodium: 137 mmol/L (ref 135–145)

## 2016-02-02 LAB — CBC
HCT: 29.4 % — ABNORMAL LOW (ref 35.0–47.0)
Hemoglobin: 9.9 g/dL — ABNORMAL LOW (ref 12.0–16.0)
MCH: 29.7 pg (ref 26.0–34.0)
MCHC: 33.6 g/dL (ref 32.0–36.0)
MCV: 88.5 fL (ref 80.0–100.0)
PLATELETS: 234 10*3/uL (ref 150–440)
RBC: 3.32 MIL/uL — AB (ref 3.80–5.20)
RDW: 17.2 % — ABNORMAL HIGH (ref 11.5–14.5)
WBC: 8.3 10*3/uL (ref 3.6–11.0)

## 2016-02-02 MED ORDER — AMOXICILLIN-POT CLAVULANATE 875-125 MG PO TABS: 1.0000 | ORAL_TABLET | Freq: Two times a day (BID) | ORAL | 0 refills | Status: DC

## 2016-02-02 MED ORDER — OXYCODONE HCL 5 MG PO TABS: 2.5000 mg | ORAL_TABLET | ORAL | Status: DC | PRN

## 2016-02-02 MED ORDER — AMOXICILLIN-POT CLAVULANATE 875-125 MG PO TABS
1.0000 | ORAL_TABLET | Freq: Two times a day (BID) | ORAL | Status: DC
  Administered 2016-02-02: 1 via ORAL
  Filled 2016-02-02: qty 1

## 2016-02-02 NOTE — Care Management Note (Signed)
Case Management Note  Patient Details  Name: Natasha [name] MRN: [id number] Date of Birth: May 19, 1928  Subjective/Objective:                  Met with patient to discuss home health PT. She states she lives at independent living at [facility] with her husband. She agrees to [place] but declined HHRN as she states she does not need help with leg wounds. She would like to use [facility] for home health services. She states she has a rollator she ambulates with. She states her husband will provide transportation to home today. She uses [facility] on [address]. And denies difficulty obtaining medications. She uses nocturnal O2. PCP is DR. Emily Filbert.    Action/Plan:   Referral to [facility]. Spoke with RN about continuous O2 need however patient is back to baseline- nocturnal O2. No further RNCM needs.   Expected Discharge Date:  02/02/16               Expected Discharge Plan:     In-House Referral:     Discharge planning Services     Post Acute Care Choice:  Home Health, Durable Medical Equipment Choice offered to:  Patient  DME Arranged:    DME Agency:     HH Arranged:  PT [place]:  [facility] (now [facility])  Status of Service:  Completed, signed off  If discussed at [place] of Stay Meetings, dates discussed:    Additional Comments:  Marshell Garfinkel, RN 02/02/2016, 11:46 AM

## 2016-02-02 NOTE — Discharge Summary (Signed)
[place] [facility] [place] NAME: Natasha [name]    MR#:  [id number]  DATE OF BIRTH:  1928/05/29  DATE OF ADMISSION:  01/30/2016 ADMITTING PHYSICIAN: Henreitta Leber, MD  DATE OF DISCHARGE: 02/02/2016 12:15 PM  PRIMARY CARE PHYSICIAN: Rusty Aus, MD    ADMISSION DIAGNOSIS:  Pneumonia of both lower lobes due to infectious organism [J18.9]  DISCHARGE DIAGNOSIS:  Active Problems:   Pneumonia   SECONDARY DIAGNOSIS:   Past Medical History:  Diagnosis Date  . Anemia   . Arthritis   . Asthma   . Bilateral pneumonia may 2017  . Bronchitis   . Cardiomegaly   . Chronic respiratory failure with hypoxia ([place])   . Cochlear implant in place    bilateral, [name], 04/26/2007   . Gallstones 2015  . GERD (gastroesophageal reflux disease)   . H/O blood clots 2014  . History of home oxygen therapy    at night  . Hypertension   . Hypokalemia   . Peripheral vascular disease ([place])    with arterial clots- on xarelto  . Polyneuropathy ([place])   . Renal disorder    Chronic Kidney Disease, Stage 5  . Restless leg syndrome     HOSPITAL COURSE:   1. Clinical sepsis with bilateral pneumonia, fever and leukocytosis. Patient was started on aggressive antibiotics with vancomycin and Zosyn. Since the MRSA PCR was negative I discontinued the vancomycin and I added Zithromax during the hospital course for atypicals. The patient was feeling better and wanted to go home on the day of discharge. I switched antibiotics over to Augmentin to complete a course. 2. Hypokalemia replaced during the hospital course 3. The patient had relative hypotension during the hospital course and I need to give a fluid bolus and I held the Cardizem and Lasix 1 day. Restart upon discharge. 4. Open wound left leg. Continue local wound care. Aloe up at the wound care center as she does. 5. Hyperlipidemia unspecified on simvastatin 6. Restless leg syndrome on Mirapex 7. GERD  on Protonix 8. Neuropathy on Neurontin 9. Peripheral vascular disease on Plavix and statin 10. Weakness. Physical therapy recommended home with home health which was set up  [place]:   Satisfactory  CONSULTS OBTAINED:   none  DRUG ALLERGIES:   Allergies  Allergen Reactions  . Codeine Diarrhea  . Hydrocodone Nausea And Vomiting  . Levofloxacin Other (See Comments)    Muscle cramps  . Tramadol Nausea And Vomiting    DISCHARGE MEDICATIONS:   Discharge Medication List as of 02/02/2016 11:40 AM    CONTINUE these medications which have CHANGED   Details  amoxicillin-clavulanate (AUGMENTIN) 875-125 MG tablet Take 1 tablet by mouth every 12 (twelve) hours., Starting Mon 02/02/2016, Print      CONTINUE these medications which have NOT CHANGED   Details  albuterol (PROVENTIL HFA;VENTOLIN HFA) 108 (90 BASE) MCG/ACT inhaler Inhale 2 puffs into the lungs 2 (two) times daily as needed for wheezing or shortness of breath. , Historical Med    aspirin EC 325 MG tablet Take 325 mg by mouth at bedtime. , Historical Med    budesonide (PULMICORT) 180 MCG/ACT inhaler Inhale 2 puffs into the lungs 2 (two) times daily., Historical Med    Calcium Carbonate-Vit D-Min (CALCIUM 1200) 1200-1000 MG-UNIT CHEW Chew 1 tablet by mouth daily., Historical Med    cholecalciferol (VITAMIN D) 1000 units tablet Take 1,000 Units by mouth every morning. , Historical Med  clopidogrel (PLAVIX) 75 MG tablet Take 75 mg by mouth daily., Historical Med    diltiazem (CARDIZEM CD) 180 MG 24 hr capsule Take 180 mg by mouth every morning. , Historical Med    docusate sodium (COLACE) 100 MG capsule Take 400 mg by mouth at bedtime., Historical Med    Esomeprazole Magnesium (NEXIUM PO) Take 22.3 mg by mouth 2 (two) times daily. , Historical Med    ferrous sulfate 325 (65 FE) MG tablet Take 1 tablet (325 mg total) by mouth daily., Starting Sat 05/24/2015, Print    furosemide (LASIX) 20 MG tablet Take 20 mg  by mouth 2 (two) times daily., Historical Med    gabapentin (NEURONTIN) 300 MG capsule Take 300 mg by mouth 2 (two) times daily., Historical Med    Melatonin 5 MG TABS Take 5 mg by mouth at bedtime. , Historical Med    montelukast (SINGULAIR) 10 MG tablet Take 10 mg by mouth every morning. , Historical Med    oxyCODONE (ROXICODONE) 5 MG immediate release tablet Take 1 tablet (5 mg total) by mouth every 4 (four) hours as needed for severe pain., Starting Tue 08/19/2015, Print    polyethylene glycol (MIRALAX / GLYCOLAX) packet Take 17 g by mouth daily as needed., Starting Wed 06/11/2015, Print    potassium chloride SA (K-DUR,KLOR-CON) 20 MEQ tablet Take 20-40 mEq by mouth See admin instructions. Take 2 tablets (40 mEq) by mouth every morning, Take 20 mEq by mouth every afternoon, and Take 2 tablets (40 mEq) by mouth every night at bedtime., Historical Med    pramipexole (MIRAPEX) 0.5 MG tablet Take 0.5 mg by mouth 2 (two) times daily., Starting Mon 06/30/2015, Historical Med    simvastatin (ZOCOR) 20 MG tablet Take 20 mg by mouth at bedtime., Starting Thu 07/03/2015, Historical Med    temazepam (RESTORIL) 15 MG capsule Take 15 mg by mouth at bedtime as needed for sleep., Historical Med    albuterol (PROVENTIL) (2.5 MG/3ML) 0.083% nebulizer solution Take 3 mLs (2.5 mg total) by nebulization every 6 (six) hours as needed for wheezing or shortness of breath., Starting Wed 06/11/2015, Print      STOP taking these medications     oxyCODONE-acetaminophen (PERCOCET/ROXICET) 5-325 MG tablet      predniSONE (DELTASONE) 10 MG tablet          DISCHARGE INSTRUCTIONS:   Follow-up with PMD one week  If you experience worsening of your admission symptoms, develop shortness of breath, life threatening emergency, suicidal or homicidal thoughts you must seek medical attention immediately by calling 911 or calling your MD immediately  if symptoms less severe.  You Must read complete  instructions/literature along with all the possible adverse reactions/side effects for all the Medicines you take and that have been prescribed to you. Take any new Medicines after you have completely understood and accept all the possible adverse reactions/side effects.   Please note  You were cared for by a hospitalist during your hospital stay. If you have any questions about your discharge medications or the care you received while you were in the hospital after you are discharged, you can call the unit and asked to speak with the hospitalist on call if the hospitalist that took care of you is not available. Once you are discharged, your primary care physician will handle any further medical issues. Please note that NO REFILLS for any discharge medications will be authorized once you are discharged, as it is imperative that you return to your primary  care physician (or establish a relationship with a primary care physician if you do not have one) for your aftercare needs so that they can reassess your need for medications and monitor your lab values.    Today   CHIEF COMPLAINT:   Chief Complaint  Patient presents with  . Chills  . Fever    HISTORY OF PRESENT ILLNESS:  Natasha [name]  is a 83 y.o. female presented with fever and chills and found to have pneumonia   VITAL SIGNS:  Blood pressure (!) 152/68, pulse 82, temperature 98.1 F (36.7 C), temperature source Oral, resp. rate 18, height 4\' 10"  (1.473 m), weight 63 kg (139 lb), SpO2 97 %.   PHYSICAL EXAMINATION:  GENERAL:  83 y.o.-year-old patient lying in the bed with no acute distress.  EYES: Pupils equal, round, reactive to light and accommodation. No scleral icterus. Extraocular muscles intact.  HEENT: Head atraumatic, normocephalic. Oropharynx and nasopharynx clear.  NECK:  Supple, no jugular venous distention. No thyroid enlargement, no tenderness.  LUNGS: Decreased breath sounds bilaterally bases, no wheezing,  rales,rhonchi or crepitation. No use of accessory muscles of respiration.  CARDIOVASCULAR: S1, S2 normal. No murmurs, rubs, or gallops.  ABDOMEN: Soft, non-tender, non-distended. Bowel sounds present. No organomegaly or mass.  EXTREMITIES: No pedal edema, cyanosis, or clubbing.  NEUROLOGIC: Cranial nerves II through XII are intact. Muscle strength 5/5 in all extremities. Sensation intact. Gait not checked.  PSYCHIATRIC: The patient is alert and oriented x 3.  SKIN: Left leg to open wounds the one on the lower leg is larger. No signs of infection  DATA REVIEW:   CBC  Recent Labs Lab 02/02/16 0353  WBC 8.3  HGB 9.9*  HCT 29.4*  PLT 234    Chemistries   Recent Labs Lab 01/30/16 1340  02/01/16 0348 02/02/16 0353  NA 134*  < > 140 137  K 3.3*  < > 3.6 3.7  CL 96*  < > 106 100*  CO2 28  < > 28 31  GLUCOSE 101*  < > 104* 104*  BUN 33*  < > 13 11  CREATININE 1.10*  < > 0.81 0.70  CALCIUM 9.0  < > 8.1* 8.6*  MG  --   --  2.0  --   AST 27  --   --   --   ALT 16  --   --   --   ALKPHOS 66  --   --   --   BILITOT 0.4  --   --   --   < > = values in this interval not displayed.  Cardiac Enzymes No results for input(s): TROPONINI in the last 168 hours.  Microbiology Results  Results for orders placed or performed during the hospital encounter of 01/30/16  Culture, blood (Routine X 2) w Reflex to ID Panel     Status: None (Preliminary result)   Collection Time: 01/30/16  2:18 PM  Result Value Ref Range Status   Specimen Description BLOOD LEFT ASSIST CONTROL  Final   Special Requests   Final    BOTTLES DRAWN AEROBIC AND ANAEROBIC ANA10ML AER11ML   Culture NO GROWTH 3 DAYS  Final   Report Status PENDING  Incomplete  Culture, blood (Routine X 2) w Reflex to ID Panel     Status: None (Preliminary result)   Collection Time: 01/30/16  2:18 PM  Result Value Ref Range Status   Specimen Description BLOOD RIGHT FOREARM  Final   Special Requests BOTTLES DRAWN  AEROBIC AND ANAEROBIC  AER11ML ANA9ML  Final   Culture NO GROWTH 3 DAYS  Final   Report Status PENDING  Incomplete  MRSA PCR Screening     Status: None   Collection Time: 01/30/16  7:03 PM  Result Value Ref Range Status   MRSA by PCR NEGATIVE NEGATIVE Final    Comment:        The GeneXpert MRSA Assay (FDA approved for NASAL specimens only), is one component of a comprehensive MRSA colonization surveillance program. It is not intended to diagnose MRSA infection nor to guide or monitor treatment for MRSA infections.      Management plans discussed with the patient, And she is in agreement.  CODE STATUS:  Code Status History    Date Active Date Inactive Code Status Order ID Comments User Context   01/30/2016  6:06 PM 02/02/2016  3:20 PM Full Code [id number]  Henreitta Leber, MD Inpatient   08/24/2015  9:16 PM 08/25/2015 10:24 AM Full Code [id number]  Gladstone Lighter, MD Inpatient   06/27/2015  4:42 PM 06/30/2015  4:16 PM Partial Code [id number]  Bettey Costa, MD Inpatient   06/07/2015  8:08 PM 06/11/2015  7:26 PM Full Code [id number]  Fritzi Mandes, MD Inpatient   05/22/2015  1:52 PM 05/24/2015  4:12 PM Full Code [id number]  Algernon Huxley, MD Inpatient   03/11/2015  6:20 PM 03/16/2015  2:15 PM Full Code [id number]  Fritzi Mandes, MD Inpatient   02/27/2015  3:53 PM 03/03/2015  6:24 PM Full Code [id number]  Sela Hua, PA-C Inpatient    Advance Directive Documentation   Flowsheet Row Most Recent Value  Type of Advance Directive  Healthcare Power of Attorney, Living will  Pre-existing out of facility DNR order (yellow form or pink MOST form)  No data  "MOST" Form in Place?  No data      TOTAL TIME TAKING CARE OF THIS PATIENT: 35 minutes.    Loletha Grayer M.D on 02/02/2016 at 6:20 PM  Between 7am to 6pm - Pager - [phone]  After 6pm go to [email or web address] - password [email or web address]  [facility] Office  [phone]  CC: Primary care physician; Rusty Aus, MD

## 2016-02-02 NOTE — Progress Notes (Signed)
Discharge instructions and prescription given with verbalized understanding.  IV's removed per policy and procedure.  Patient taken to visitors entrance via wheelchair by nurse aid to be taken home by husband via personal vehicle.

## 2016-02-02 NOTE — Discharge Instructions (Signed)
Community-Acquired Pneumonia, Adult °Introduction °Pneumonia is an infection of the lungs. One type of pneumonia can happen while a person is in a hospital. A different type can happen when a person is not in a hospital (community-acquired pneumonia). It is easy for this kind to spread from person to person. It can spread to you if you breathe near an infected person who coughs or sneezes. Some symptoms include: °· A dry cough. °· A wet (productive) cough. °· Fever. °· Sweating. °· Chest pain. °Follow these instructions at home: °· Take over-the-counter and prescription medicines only as told by your doctor. °¨ Only take cough medicine if you are losing sleep. °¨ If you were prescribed an antibiotic medicine, take it as told by your doctor. Do not stop taking the antibiotic even if you start to feel better. °· Sleep with your head and neck raised (elevated). You can do this by putting a few pillows under your head, or you can sleep in a recliner. °· Do not use tobacco products. These include cigarettes, chewing tobacco, and e-cigarettes. If you need help quitting, ask your doctor. °· Drink enough water to keep your pee (urine) clear or pale yellow. °A shot (vaccine) can help prevent pneumonia. Shots are often suggested for: °· People older than 81 years of age. °· People older than 81 years of age: °¨ Who are having cancer treatment. °¨ Who have long-term (chronic) lung disease. °¨ Who have problems with their body's defense system (immune system). °You may also prevent pneumonia if you take these actions: °· Get the flu (influenza) shot every year. °· Go to the dentist as often as told. °· Wash your hands often. If soap and water are not available, use hand sanitizer. °Contact a doctor if: °· You have a fever. °· You lose sleep because your cough medicine does not help. °Get help right away if: °· You are short of breath and it gets worse. °· You have more chest pain. °· Your sickness gets worse. This is very  serious if: °¨ You are an older adult. °¨ Your body's defense system is weak. °· You cough up blood. °This information is not intended to replace advice given to you by your health care provider. Make sure you discuss any questions you have with your health care provider. °Document Released: 06/16/2007 Document Revised: 06/05/2015 Document Reviewed: 04/24/2014 °© 2017 [employer]vier ° °

## 2016-02-02 NOTE — Progress Notes (Signed)
Pharmacy Antibiotic Note  Natasha [name] is a 83 y.o. female admitted on 01/30/2016 with pneumonia/HCAP.  Pharmacy has been consulted for piperacillin/tazobactam dosing.  Plan: Continue piperacillin/tazobactam 3.375 g IE q8h  Pt also receiving azithromycin 250 PO daily for atypical coverage per MD  Will f/u stop dates with MD  Height: 4\' 10"  (147.3 cm) Weight: 139 lb (63 kg) IBW/kg (Calculated) : 40.9  Temp (24hrs), Avg:98 F (36.7 C), Min:97.2 F (36.2 C), Max:99 F (37.2 C)   Recent Labs Lab 01/30/16 1340 01/30/16 1418 01/31/16 0318 02/01/16 0348 02/02/16 0353  WBC 22.9*  --  17.2*  --  8.3  CREATININE 1.10*  --  0.88 0.81 0.70  LATICACIDVEN  --  1.5  --   --   --     Estimated Creatinine Clearance: 40.4 mL/min (by C-G formula based on SCr of 0.7 mg/dL).    Allergies  Allergen Reactions  . Codeine Diarrhea  . Hydrocodone Nausea And Vomiting  . Levofloxacin Other (See Comments)    Muscle cramps  . Tramadol Nausea And Vomiting    Antimicrobials this admission: Cefepime x1 vancomycin 1/19 >> 1/20 Piperacillin/tazobactam 1/19 >> Azithromycin 1/20 >>  Dose adjustments this admission:  Microbiology results: 1/19 BCx: NG x 3 days 1/19 MRSA PCR: negative  Thank you for allowing pharmacy to be a part of this patient's care.  Darrow Bussing, PharmD Pharmacy Resident 02/02/2016 9:13 AM

## 2016-02-04 LAB — CULTURE, BLOOD (ROUTINE X 2)
CULTURE: NO GROWTH
Culture: NO GROWTH

## 2016-02-04 NOTE — Care Management (Signed)
Post discharge: received message at my desk phone [phone] from patient's husband asking "where is home health agency you were supposed to be sending; is someone going to call us?"  I have sent request to Lisette Abu and Sonia Side at [facility] to follow up with patient at [phone].

## 2016-02-09 ENCOUNTER — Ambulatory Visit: Payer: Medicare Other | Admitting: Surgery

## 2016-02-12 ENCOUNTER — Encounter: Payer: Medicare Other | Attending: Surgery | Admitting: Surgery

## 2016-02-12 DIAGNOSIS — M1990 Unspecified osteoarthritis, unspecified site: Secondary | ICD-10-CM | POA: Insufficient documentation

## 2016-02-12 DIAGNOSIS — I2510 Atherosclerotic heart disease of native coronary artery without angina pectoris: Secondary | ICD-10-CM | POA: Diagnosis not present

## 2016-02-12 DIAGNOSIS — Z87891 Personal history of nicotine dependence: Secondary | ICD-10-CM | POA: Insufficient documentation

## 2016-02-12 DIAGNOSIS — L97122 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 exposed: Secondary | ICD-10-CM | POA: Diagnosis not present

## 2016-02-12 DIAGNOSIS — E11622 Type 2 diabetes mellitus with other skin ulcer: Secondary | ICD-10-CM | POA: Insufficient documentation

## 2016-02-12 DIAGNOSIS — I70242 Atherosclerosis 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ration of calf: Secondary | ICD-10-CM | POA: Diagnosis not present

## 2016-02-12 DIAGNOSIS — G2581 Restless legs syndrome: Secondary | ICD-10-CM | POA: Diagnosis not present

## 2016-02-12 DIAGNOSIS — Z7982 Long term (current) use of aspirin: Secondary | ICD-10-CM | POA: Diagnosis not present

## 2016-02-12 DIAGNOSIS — K219 Gastro-esophageal reflux disease without esophagitis: Secondary | ICD-10-CM | POA: Insufficient documentation

## 2016-02-12 DIAGNOSIS — G500 Trigeminal neuralgia: Secondary | ICD-10-CM | POA: Insufficient documentation

## 2016-02-12 DIAGNOSIS — J449 Chronic obstructive pulmonary disease, unspecified: Secondary | ICD-10-CM | POA: Insufficient documentation

## 2016-02-12 DIAGNOSIS — L97222 Non-pressure chronic ulcer of left calf with fat layer exposed: Secondary | ICD-10-CM | POA: Diagnosis not present

## 2016-02-12 DIAGNOSIS — E1140 Type 2 diabetes mellitus with diabetic neuropathy, unspecified: Secondary | ICD-10-CM | POA: Diagnosis not present

## 2016-02-12 DIAGNOSIS — D649 Anemia, unspecified: Secondary | ICD-10-CM | POA: Insufficient documentation

## 2016-02-12 DIAGNOSIS — I10 Essential (primary) hypertension: Secondary | ICD-10-CM | POA: Diagnosis not present

## 2016-02-12 DIAGNOSIS — Z79899 Other long term (current) drug therapy: Secondary | ICD-10-CM | POA: Diagnosis not present

## 2016-02-13 NOTE — Progress Notes (Addendum)
CAMYAH, PULTZ ([id number]) Visit Report for 02/12/2016 Arrival Information Details Patient Name: Natasha [name], Natasha [name] Date of Service: 02/12/2016 10:30 AM Medical Record Number: [id number] Patient Account Number: [id number] Date of Birth/Sex: December 20, 1928 (83 y.o. Female) Treating RN: Afful, RN, [name], Velva Harman Primary Care Isabelly Kobler: Emily Filbert Other Clinician: Referring Crissie Aloi: Emily Filbert Treating Stepfanie Yott/Extender: Frann Rider in Treatment: 3 Visit Information History Since Last Visit All ordered tests and consults were completed: No Patient Arrived: [name] Added or deleted any medications: No Arrival Time: 10:35 Any new allergies or adverse reactions: No Accompanied By: hubby Had a fall or experienced change in No Transfer Assistance: None activities of daily living that may affect Patient Identification Verified: Yes risk of falls: Secondary Verification Process Yes Hospitalized since last visit: No Completed: Has Dressing in Place as Prescribed: Yes Patient Requires Transmission- No Pain Present Now: No Based Precautions: Patient Has Alerts: Yes Patient Alerts: Patient on Blood Thinner Plavix Electronic Signature(s) Signed: 02/12/2016 3:37:28 PM By: Regan Lemming [name], RN Entered By: Regan Lemming on 02/12/2016 10:35:56 Natasha [name] ([id number]) -------------------------------------------------------------------------------- Encounter Discharge Information Details Patient Name: Natasha [name] Date of Service: 02/12/2016 10:30 AM Medical Record Number: [id number] Patient Account Number: [id number] Date of Birth/Sex: 26-Jul-1928 (83 y.o. Female) Treating RN: Baruch Gouty, RN, [name], Velva Harman Primary Care Lennin Osmond: Emily Filbert Other Clinician: Referring Elif Yonts: Emily Filbert Treating Chrystel Barefield/Extender: Frann Rider in Treatment: 3 Encounter Discharge Information Items Discharge Pain Level: 0 Discharge Condition: Stable Ambulatory Status:  Walker Discharge Destination: Home Transportation: Private Auto Accompanied By: Micheline Rough Schedule Follow-up Appointment: No Medication Reconciliation completed No and provided to Patient/Care Rc Amison: Provided on Clinical Summary of Care: 02/12/2016 Form Type Recipient Paper Patient [facility] Electronic Signature(s) Signed: 02/12/2016 11:17:31 AM By: Ruthine Dose Entered By: Ruthine Dose on 02/12/2016 11:17:31 Natasha [name] ([id number]) -------------------------------------------------------------------------------- Lower Extremity Assessment Details Patient Name: Natasha [name] Date of Service: 02/12/2016 10:30 AM Medical Record Number: [id number] Patient Account Number: [id number] Date of Birth/Sex: 01-07-1929 (83 y.o. Female) Treating RN: Afful, RN, [name], Velva Harman Primary Care Ambrea Hegler: Emily Filbert Other Clinician: Referring Amit Meloy: Emily Filbert Treating Amarria Andreasen/Extender: Frann Rider in Treatment: 3 Edema Assessment Assessed: Shirlyn Goltz: No] [Right: No] E[Left: dema] [Right: :] Calf Left: Right: Point of Measurement: 28 cm From Medial Instep 32.2 cm cm Ankle Left: Right: Point of Measurement: 10 cm From Medial Instep 23.7 cm cm Vascular Assessment Claudication: Claudication Assessment [Left:None] Pulses: Dorsalis Pedis Palpable: [Left:Yes] Posterior Tibial Extremity colors, hair growth, and conditions: Extremity Color: [Left:Mottled] Hair Growth on Extremity: [Left:No] Temperature of Extremity: [Left:Warm] Capillary Refill: [Left:< 3 seconds] Electronic Signature(s) Signed: 02/12/2016 3:37:28 PM By: Regan Lemming [name], RN Entered By: Regan Lemming on 02/12/2016 11:00:35 Natasha [name] ([id number]) -------------------------------------------------------------------------------- Multi Wound Chart Details Patient Name: Natasha [name] Date of Service: 02/12/2016 10:30 AM Medical Record Number: [id number] Patient Account Number: [id number] Date of  Birth/Sex: 09-May-1928 (83 y.o. Female) Treating RN: Baruch Gouty, RN, [name], Velva Harman Primary Care Jaydrien Wassenaar: Emily Filbert Other Clinician: Referring Ledia Hanford: Emily Filbert Treating Willie Loy/Extender: Frann Rider in Treatment: 3 Vital Signs Height(in): 59 Pulse(bpm): 83 Weight(lbs): 140 Blood Pressure 129/55 (mmHg): Body Mass Index(BMI): 28 Temperature(F): 97.6 Respiratory Rate 16 (breaths/min): Photos: [1:No Photos] [2:No Photos] [3:No Photos] Wound Location: [1:Left Upper Leg - Medial Left Lower Leg - Proximal Left Lower Leg - Distal] Wounding Event: [1:Surgical Injury] [2:Surgical Injury] [3:Surgical Injury] Primary Etiology: [1:Open Surgical Wound] [2:Open Surgical Wound] [3:Open Surgical Wound] Comorbid History: [1:Anemia, Asthma, Hypertension, Peripheral Hypertension, Peripheral Hypertension, Peripheral Venous Disease, Osteoarthritis, Neuropathy Osteoarthritis, Neuropathy  Osteoarthritis, Neuropathy] [2:Anemia, Asthma, Venous Disease,]  [3:Anemia, Asthma, Venous Disease,] Date Acquired: [1:12/04/2015] [2:12/04/2015] [3:12/04/2015] Weeks of Treatment: [1:3] [2:3] [3:3] Wound Status: [1:Open] [2:Open] [3:Open] Measurements L x W x D 2.5x0.8x1.5 [2:1x0.3x0.1] [3:6.5x1.5x0.8] (cm) Area (cm) : [1:1.571] [2:0.236] [3:7.658] Volume (cm) : [1:2.356] [2:0.024] [3:6.126] % Reduction in Area: [1:-66.80%] [2:58.20%] [3:56.70%] % Reduction in Volume: -78.60% [2:95.30%] [3:50.50%] Classification: [1:Full Thickness With Exposed Support Structures] [2:Full Thickness With Exposed Support Structures] [3:Full Thickness With Exposed Support Structures] Exudate Amount: [1:Large] [2:None Present] [3:Large] Exudate Type: [1:Serosanguineous] [2:N/A] [3:Serosanguineous] Exudate Color: [1:red, brown] [2:N/A] [3:red, brown] Wound Margin: [1:Distinct, outline attached Distinct, outline attached Distinct, outline attached] Granulation Amount: [1:Large (67-100%)] [2:None Present (0%)] [3:Medium  (34-66%)] Granulation Quality: [1:Red, Pink] [2:N/A] [3:Red, Pink, Hyper- granulation] Necrotic Amount: [1:Small (1-33%)] [2:Large (67-100%)] [3:Medium (34-66%)] Necrotic Tissue: [1:Adherent Slough] [2:Eschar] [3:Adherent Slough] Exposed Structures: AUBRIANA, RAVELO ([id number]) Fascia: Yes Fascia: Yes Fascia: Yes Fat Layer (Subcutaneous Fat Layer (Subcutaneous Fat Layer (Subcutaneous Tissue) Exposed: Yes Tissue) Exposed: Yes Tissue) Exposed: Yes Muscle: Yes Muscle: Yes Epithelialization: None Large (67-100%) None Debridement: N/A N/A Debridement ([id number]) Pre-procedure N/A N/A 11:00 Verification/Time Out Taken: Pain Control: N/A N/A Lidocaine 4% Topical Solution Tissue Debrided: N/A N/A Necrotic/Eschar, Fibrin/Slough, Exudates, Subcutaneous Level: N/A N/A Skin/Subcutaneous Tissue/Muscle Debridement Area (sq N/A N/A 9.75 cm): Instrument: N/A N/A Curette Bleeding: N/A N/A Minimum Hemostasis Achieved: N/A N/A Pressure Procedural Pain: N/A N/A 0 Post Procedural Pain: N/A N/A 0 Debridement Treatment N/A N/A Procedure was tolerated Response: well Post Debridement N/A N/A 6.5x1.5x1 Measurements L x W x D (cm) Post Debridement N/A N/A 7.658 Volume: (cm) Periwound Skin Texture: No Abnormalities Noted Induration: No Induration: Yes Scarring: Yes Periwound Skin No Abnormalities Noted Dry/Scaly: Yes No Abnormalities Noted Moisture: Periwound Skin Color: Erythema: No Erythema: No Erythema: No Temperature: No Abnormality No Abnormality No Abnormality Tenderness on Yes No Yes Palpation: Wound Preparation: Ulcer Cleansing: Ulcer Cleansing: Ulcer Cleansing: Rinsed/Irrigated with Rinsed/Irrigated with Rinsed/Irrigated with Saline Saline Saline Topical Anesthetic Topical Anesthetic Topical Anesthetic Applied: Other: lidocaine Applied: Other: lidocaine Applied: Other: lidocaine 4% 4% 4% Procedures Performed: N/A N/A Debridement Treatment Notes TIFFNY, GEMMER ([id number]) Wound #1 (Left, Medial Upper Leg) 1. Cleansed with: Clean wound with Normal Saline 4. Dressing Applied: Aquacel Ag Medihoney Gel 5. Secondary Dressing Applied ABD Pad Bordered Foam Dressing 7. Secured with Tape Wound #2 (Left, Proximal Lower Leg) 1. Cleansed with: Clean wound with Normal Saline 4. Dressing Applied: Aquacel Ag Medihoney Gel 5. Secondary Dressing Applied ABD Pad Bordered Foam Dressing 7. Secured with Tape Wound #3 (Left, Distal Lower Leg) 1. Cleansed with: Clean wound with Normal Saline 4. Dressing Applied: Aquacel Ag Medihoney Gel 5. Secondary Dressing Applied ABD Pad Bordered Foam Dressing 7. Secured with Secretary/administrator) Signed: 02/12/2016 11:28:24 AM By: Evlyn Kanner MD, FACS Entered By: Evlyn Kanner on 02/12/2016 11:28:23 Delman Kitten ([id number]) -------------------------------------------------------------------------------- Multi-Disciplinary Care Plan Details Patient Name: Delman Kitten Date of Service: 02/12/2016 10:30 AM Medical Record Number: [id number] Patient Account Number: [id number] Date of Birth/Sex: 04-Apr-1928 (83 y.o. Female) Treating RN: Clover Mealy, RN, [name], [place] Primary Care Angline Schweigert: Bethann Punches Other Clinician: Referring Sha Burling: Bethann Punches Treating Calton Harshfield/Extender: Rudene Re in Treatment: 3 Active Inactive ` Abuse / Safety / Falls / Self Care Management Nursing Diagnoses: Potential for falls Goals: Patient will remain injury free Date Initiated: 01/19/2016 Target Resolution Date: 05/25/2016 Goal Status: Active Interventions: Assess fall risk on admission and as needed Assess: immobility, friction, shearing, incontinence upon admission and as  needed Assess impairment of mobility on admission and as needed per policy Notes: ` Nutrition Nursing Diagnoses: Imbalanced nutrition Goals: Patient/caregiver agrees to and verbalizes understanding of need to use  nutritional supplements and/or vitamins as prescribed Date Initiated: 01/19/2016 Target Resolution Date: 05/25/2016 Goal Status: Active Interventions: Assess patient nutrition upon admission and as needed per policy Notes: ` Orientation to the [place] [place]. ([id number]) Nursing Diagnoses: Knowledge deficit related to the wound healing center program Goals: Patient/caregiver will verbalize understanding of the [place] Date Initiated: 01/19/2016 Target Resolution Date: 05/25/2016 Goal Status: Active Interventions: Provide education on orientation to the wound center Notes: ` Pain, Acute or Chronic Nursing Diagnoses: Pain, acute or chronic: actual or potential Potential alteration in comfort, pain Goals: Patient will verbalize adequate pain control and receive pain control interventions during procedures as needed Date Initiated: 01/19/2016 Target Resolution Date: 05/25/2016 Goal Status: Active Patient/caregiver will verbalize adequate pain control between visits Date Initiated: 01/19/2016 Target Resolution Date: 05/25/2016 Goal Status: Active Patient/caregiver will verbalize comfort level met Date Initiated: 01/19/2016 Target Resolution Date: 05/25/2016 Goal Status: Active Interventions: Assess comfort goal upon admission Complete pain assessment as per visit requirements Notes: ` Wound/Skin Impairment Nursing Diagnoses: Impaired tissue integrity Knowledge deficit related to ulceration/compromised skin integrity Goals: CHERIKA, JESSIE ([id number]) Ulcer/skin breakdown will have a volume reduction of 30% by week 4 Date Initiated: 01/19/2016 Target Resolution Date: 05/25/2016 Goal Status: Active Ulcer/skin breakdown will have a volume reduction of 50% by week 8 Date Initiated: 01/19/2016 Target Resolution Date: 05/25/2016 Goal Status: Active Ulcer/skin breakdown will have a volume reduction of 80% by week 12 Date Initiated:  01/19/2016 Target Resolution Date: 05/25/2016 Goal Status: Active Interventions: Assess patient/caregiver ability to perform ulcer/skin care regimen upon admission and as needed Assess ulceration(s) every visit Notes: Electronic Signature(s) Signed: 02/12/2016 1:46:37 PM By: Regan Lemming [name], RN Entered By: Regan Lemming on 02/12/2016 13:46:37 Natasha [name] ([id number]) -------------------------------------------------------------------------------- Pain Assessment Details Patient Name: Natasha [name] Date of Service: 02/12/2016 10:30 AM Medical Record Number: [id number] Patient Account Number: [id number] Date of Birth/Sex: [birth year] (83 y.o. Female) Treating RN: Baruch Gouty, RN, [name], Velva Harman Primary Care Adriena Manfre: Emily Filbert Other Clinician: Referring Levora Werden: Emily Filbert Treating Shamiya Demeritt/Extender: Frann Rider in Treatment: 3 Active Problems Location of Pain Severity and Description of Pain Patient Has Paino No Site Locations With Dressing Change: No Pain Management and Medication Current Pain Management: Electronic Signature(s) Signed: 02/12/2016 3:37:28 PM By: Regan Lemming [name], RN Entered By: Regan Lemming on 02/12/2016 10:36:05 Natasha [name] ([id number]) -------------------------------------------------------------------------------- Patient/Caregiver Education Details Patient Name: Natasha [name] Date of Service: 02/12/2016 10:30 AM Medical Record Number: [id number] Patient Account Number: [id number] Date of Birth/Gender: 1928/01/26 (83 y.o. Female) Treating RN: Baruch Gouty, RN, [name], Velva Harman Primary Care Physician: Emily Filbert Other Clinician: Referring Physician: Emily Filbert Treating Physician/Extender: Frann Rider in Treatment: 3 Education Assessment Education Provided To: Patient Education Topics Provided Welcome To [facility]: Methods: Explain/Verbal Responses: State content correctly Electronic Signature(s) Signed: 02/12/2016  3:37:28 PM By: Regan Lemming [name], RN Entered By: Regan Lemming on 02/12/2016 11:15:38 Natasha [name] ([id number]) -------------------------------------------------------------------------------- Wound Assessment Details Patient Name: Natasha [name] Date of Service: 02/12/2016 10:30 AM Medical Record Number: [id number] Patient Account Number: [id number] Date of Birth/Sex: 08/12/1928 (83 y.o. Female) Treating RN: Baruch Gouty, RN, [name], Velva Harman Primary Care Oleda Borski: Emily Filbert Other Clinician: Referring Teka Chanda: Emily Filbert Treating Danen Lapaglia/Extender: Frann Rider in Treatment: 3 Wound Status Wound Number: 1 Primary Open Surgical Wound  Etiology: Wound Location: Left Upper Leg - Medial Wound Open Wounding Event: Surgical Injury Status: Date Acquired: 12/04/2015 Comorbid Anemia, Asthma, Hypertension, Weeks Of Treatment: 3 History: Peripheral Venous Disease, Clustered Wound: No Osteoarthritis, Neuropathy Photos Photo Uploaded By: Regan Lemming on 02/12/2016 15:33:35 Wound Measurements Length: (cm) 2.5 Width: (cm) 0.8 Depth: (cm) 1.5 Area: (cm) 1.571 Volume: (cm) 2.356 % Reduction in Area: -66.8% % Reduction in Volume: -78.6% Epithelialization: None Tunneling: No Undermining: No Wound Description Full Thickness With Exposed Classification: Support Structures Wound Margin: Distinct, outline attached Exudate Large Amount: Exudate Type: Serosanguineous Exudate Color: red, brown Foul Odor After Cleansing: No Wound Bed Granulation Amount: Large (67-100%) Exposed Structure Granulation Quality: Red, Pink Fascia Exposed: Yes ANYSA, TACEY ([id number]) Necrotic Amount: Small (1-33%) Fat Layer (Subcutaneous Tissue) Exposed: Yes Necrotic Quality: Adherent Slough Muscle Exposed: Yes Necrosis of Muscle: No Periwound Skin Texture Texture Color No Abnormalities Noted: No No Abnormalities Noted: No Erythema: No Moisture No Abnormalities Noted: No  Temperature / Pain Temperature: No Abnormality Tenderness on Palpation: Yes Wound Preparation Ulcer Cleansing: Rinsed/Irrigated with Saline Topical Anesthetic Applied: Other: lidocaine 4%, Electronic Signature(s) Signed: 02/12/2016 3:37:28 PM By: Regan Lemming [name], RN Entered By: Regan Lemming on 02/12/2016 10:46:16 Natasha [name] ([id number]) -------------------------------------------------------------------------------- Wound Assessment Details Patient Name: Natasha [name] Date of Service: 02/12/2016 10:30 AM Medical Record Number: [id number] Patient Account Number: [id number] Date of Birth/Sex: Mar 17, 1928 (83 y.o. Female) Treating RN: Afful, RN, [name], [employer] Primary Care Anapaula Severt: Emily Filbert Other Clinician: Referring Kyasia Steuck: Emily Filbert Treating Diamond Martucci/Extender: Frann Rider in Treatment: 3 Wound Status Wound Number: 2 Primary Open Surgical Wound Etiology: Wound Location: Left Lower Leg - Proximal Wound Open Wounding Event: Surgical Injury Status: Date Acquired: 12/04/2015 Comorbid Anemia, Asthma, Hypertension, Weeks Of Treatment: 3 History: Peripheral Venous Disease, Clustered Wound: No Osteoarthritis, Neuropathy Photos Photo Uploaded By: Regan Lemming on 02/12/2016 15:33:36 Wound Measurements Length: (cm) 1 Width: (cm) 0.3 Depth: (cm) 0.1 Area: (cm) 0.236 Volume: (cm) 0.024 % Reduction in Area: 58.2% % Reduction in Volume: 95.3% Epithelialization: Large (67-100%) Tunneling: No Undermining: No Wound Description Full Thickness With Exposed Foul Odor Aft Classification: Support Structures Slough/Fibrin Wound Margin: Distinct, outline attached None Present SHEALYNN, SAULNIER ([id number]) er Cleansing: No o No Exudate Amount: Wound Bed Granulation Amount: None Present (0%) Exposed Structure Necrotic Amount: Large (67-100%) Fascia Exposed: Yes Necrotic Quality: Eschar Fat Layer (Subcutaneous Tissue) Exposed: Yes Periwound Skin  Texture Texture Color No Abnormalities Noted: No No Abnormalities Noted: No Induration: No Erythema: No Moisture Temperature / Pain No Abnormalities Noted: No Temperature: No Abnormality Dry / Scaly: Yes Wound Preparation Ulcer Cleansing: Rinsed/Irrigated with Saline Topical Anesthetic Applied: Other: lidocaine 4%, Electronic Signature(s) Signed: 02/12/2016 3:37:28 PM By: Regan Lemming [name], RN Entered By: Regan Lemming on 02/12/2016 10:46:42 Natasha [name] ([id number]) -------------------------------------------------------------------------------- Wound Assessment Details Patient Name: Natasha [name] Date of Service: 02/12/2016 10:30 AM Medical Record Number: [id number] Patient Account Number: [id number] Date of Birth/Sex: Feb 11, 1928 (83 y.o. Female) Treating RN: Afful, RN, [name], [place] Primary Care Yossef Gilkison: Emily Filbert Other Clinician: Referring Megann Easterwood: Emily Filbert Treating Kandi Brusseau/Extender: Frann Rider in Treatment: 3 Wound Status Wound Number: 3 Primary Open Surgical Wound Etiology: Wound Location: Left Lower Leg - Distal Wound Open Wounding Event: Surgical Injury Status: Date Acquired: 12/04/2015 Comorbid Anemia, Asthma, Hypertension, Weeks Of Treatment: 3 History: Peripheral Venous Disease, Clustered Wound: No Osteoarthritis, Neuropathy Photos Wound Measurements Length: (cm) 6.5 Width: (cm) 1.5 Depth: (cm) 0.8 Area: (cm) 7.658 Volume: (cm) 6.126 % Reduction in Area: 56.7% %  Reduction in Volume: 50.5% Epithelialization: None Tunneling: No Undermining: No Wound Description Full Thickness With Exposed Classification: Support Structures Wound Margin: Distinct, outline attached Exudate Large Amount: Exudate Type: Serosanguineous Exudate Color: red, brown Foul Odor After Cleansing: No Slough/Fibrino Yes Wound Bed Granulation Amount: Medium (34-66%) Exposed Structure Granulation Quality: Red, Pink, Hyper-granulation Fascia  Exposed: Yes Necrotic Amount: Medium (34-66%) Fat Layer (Subcutaneous Tissue) Exposed: Yes MATRICIA, BEGNAUD ([id number]) Necrotic Quality: Adherent Slough Muscle Exposed: No Periwound Skin Texture Texture Color No Abnormalities Noted: No No Abnormalities Noted: No Induration: Yes Erythema: No Scarring: Yes Temperature / Pain Moisture Temperature: No Abnormality No Abnormalities Noted: No Tenderness on Palpation: Yes Wound Preparation Ulcer Cleansing: Rinsed/Irrigated with Saline Topical Anesthetic Applied: Other: lidocaine 4%, Treatment Notes Wound #3 (Left, Distal Lower Leg) 1. Cleansed with: Clean wound with Normal Saline 4. Dressing Applied: Aquacel Ag Medihoney Gel 5. Secondary Dressing Applied ABD Pad Bordered Foam Dressing 7. Secured with Recruitment consultant) Signed: 03/11/2016 3:13:54 PM By: Regan Lemming [name], RN Previous Signature: 02/12/2016 3:37:28 PM Version By: Regan Lemming [name], RN Entered By: Regan Lemming on 03/11/2016 15:13:54 Natasha [name] ([id number]) -------------------------------------------------------------------------------- Vitals Details Patient Name: Natasha [name] Date of Service: 02/12/2016 10:30 AM Medical Record Number: [id number] Patient Account Number: [id number] Date of Birth/Sex: [birth year] (83 y.o. Female) Treating RN: Afful, RN, [name], [place] Primary Care Darald Uzzle: Emily Filbert Other Clinician: Referring Savonna Birchmeier: Emily Filbert Treating Ari Engelbrecht/Extender: Frann Rider in Treatment: 3 Vital Signs Time Taken: 10:36 Temperature (F): 97.6 Height (in): 59 Pulse (bpm): 83 Weight (lbs): 140 Respiratory Rate (breaths/min): 16 Body Mass Index (BMI): 28.3 Blood Pressure (mmHg): 129/55 Reference Range: 80 - 120 mg / dl Electronic Signature(s) Signed: 02/12/2016 3:37:28 PM By: Regan Lemming [name], RN Entered By: Regan Lemming on 02/12/2016 10:39:15

## 2016-02-13 NOTE — Progress Notes (Addendum)
Natasha [name], Natasha [name] ([id number]) Visit Report for 02/12/2016 Chief Complaint Document Details Patient Name: Natasha [name] Date of Service: 02/12/2016 10:30 AM Medical Record Number: [id number] Patient Account Number: [id number] Date of Birth/Sex: [birth year] (83 y.o. Female) Treating RN: Baruch Gouty, RN, [name], Velva Harman Primary Care Provider: Emily Filbert Other Clinician: Referring Provider: Emily Filbert Treating Provider/Extender: Frann Rider in Treatment: 3 Information Obtained from: Patient Chief Complaint Patient presents to the wound care center today with an open arterial ulcer along with diabetes mellitus to the left lower extremity which she's had for over a month Electronic Signature(s) Signed: 02/12/2016 11:28:38 AM By: Christin Fudge MD, FACS Entered By: Christin Fudge on 02/12/2016 11:28:38 Natasha [name] ([id number]) -------------------------------------------------------------------------------- Debridement Details Patient Name: Natasha [name] Date of Service: 02/12/2016 10:30 AM Medical Record Number: [id number] Patient Account Number: [id number] Date of Birth/Sex: Oct 03, 1928 (83 y.o. Female) Treating RN: Cornell Barman Primary Care Provider: Emily Filbert Other Clinician: Referring Provider: Emily Filbert Treating Provider/Extender: Frann Rider in Treatment: 3 Debridement Performed for Wound #3 Left,Distal Lower Leg Assessment: Performed By: Physician Christin Fudge, MD Debridement: Debridement Pre-procedure Yes - 11:00 Verification/Time Out Taken: Start Time: 11:00 Pain Control: Lidocaine 4% Topical Solution Level: Skin/Subcutaneous Tissue Total Area Debrided (L x 6.5 (cm) x 1.5 (cm) = 9.75 (cm) W): Tissue and other Non-Viable, Eschar, Exudate, Fibrin/Slough, Subcutaneous material debrided: Instrument: Curette Bleeding: Minimum Hemostasis Achieved: Pressure End Time: 11:05 Procedural Pain: 0 Post Procedural Pain: 0 Response to Treatment:  Procedure was tolerated well Post Debridement Measurements of Total Wound Length: (cm) 6.5 Width: (cm) 1.5 Depth: (cm) 1 Volume: (cm) 7.658 Character of Wound/Ulcer Post Requires Further Debridement Debridement: Severity of Tissue Post Debridement: Fat layer exposed Post Procedure Diagnosis Same as Pre-procedure Electronic Signature(s) Signed: 03/11/2016 4:18:59 PM By: Christin Fudge MD, FACS Signed: 03/11/2016 4:56:48 PM By: [name] RN, [name], [name] RN, [name] Previous Signature: 02/12/2016 11:28:31 AM Version By: Christin Fudge MD, FACS Previous Signature: 02/12/2016 3:37:28 PM Version By: Regan Lemming [name], RN Natasha [name], Natasha [name] ([id number]) Entered By: [name] RN, [name], [name] on 03/11/2016 15:11:48 Natasha [name], Natasha [name] ([id number]) -------------------------------------------------------------------------------- HPI Details Patient Name: Natasha [name] Date of Service: 02/12/2016 10:30 AM Medical Record Number: [id number] Patient Account Number: [id number] Date of Birth/Sex: 01/19/1928 (83 y.o. Female) Treating RN: Baruch Gouty, RN, [name], Velva Harman Primary Care Provider: Emily Filbert Other Clinician: Referring Provider: Emily Filbert Treating Provider/Extender: Frann Rider in Treatment: 3 History of Present Illness Location: left thigh and left calf in the area where there was previous vascular surgery Quality: Patient reports experiencing a dull pain to affected area(s). Severity: Patient states wound (s) are getting better. Duration: Patient has had the wound for > 2 months prior to seeking treatment at the wound center Timing: Pain in wound is Intermittent (comes and goes Context: The wound occurred when the patient had vascular surgery and the wounds were not healing very well Modifying Factors: Other treatment(s) tried include:local care and oral antibiotics Associated Signs and Symptoms: Patient reports having increase swelling. HPI Description: 83 year old female with significant past  medical history of anemia, cellulitis and abscess of the leg, COPD, coronary artery disease, hypertension, osteoarthritis, peripheral vascular disease with claudication, trigeminal neuralgia and type 2 diabetes mellitus without complications. A past surgical history significant for appendectomy, left femoral o peroneal bypass graft in October 2017, and debridement of skin and subcutis tissue on the left side in December 2017. She was treated by Dr. Lucky Cowboy earlier, with multiple left leg revascularization both endovascular and open. She is a  former smoker who quit 30 years ago. When she was last seen at [facility], at the vascular surgery clinic she had a left lower extremity 3 separate wounds along the saphenectomy site. She was placed on Bactrim DS for 10 days. She had a good signal over the peroneal artery and the bypass remains patent. Prior to surgery her left ABI was 0.46 and a right was 0.98. Note the patient was seen by Dr. Amalia Hailey of podiatry on 01/15/2016 and he saw for ulceration of her left great toe. Most recently the patient was seen by Dr. Maura Crandall, the vascular surgeon on January 5 and these notes are reviewed on Epic-- he noted that the 3 wounds on the left lower extremity are granulation very slowly, and he recommended packing with wet-to-dry Kerlix. The surgeon noted excellent signal over the peroneal artery and the recommendation was to see as at the wound center to help with healing. 02/12/2016 -- we have not seen her for 2 weeks and her husband tells me that she was admitted to the hospital with a pneumonia. I have reviewed a hospital notes, she was there between January 19 and January 22, with clinical sepsis and bilateral pneumonia, fever and leukocytosis and was treated with vancomycin and Zosyn. Vancomycin was then stopped and Zithromax was added. He was discharged home on Augmentin Electronic Signature(s) Signed: 02/12/2016 11:31:13 AM By: Christin Fudge MD, FACS Entered By:  Christin Fudge on 02/12/2016 11:31:13 Natasha [name] ([id number]) -------------------------------------------------------------------------------- Physical Exam Details Patient Name: Natasha [name] Date of Service: 02/12/2016 10:30 AM Medical Record Number: [id number] Patient Account Number: [id number] Date of Birth/Sex: Jan 07, 1929 (83 y.o. Female) Treating RN: Afful, RN, [name], Velva Harman Primary Care Provider: Emily Filbert Other Clinician: Referring Provider: Emily Filbert Treating Provider/Extender: Frann Rider in Treatment: 3 Constitutional . Pulse regular. Respirations normal and unlabored. Afebrile. . Eyes Nonicteric. Reactive to light. Ears, Nose, Mouth, and Throat Lips, teeth, and gums WNL.Marland Kitchen Moist mucosa without lesions. Neck supple and nontender. No palpable supraclavicular or cervical adenopathy. Normal sized without goiter. Respiratory WNL. No retractions.. Breath sounds WNL, No rubs, rales, rhonchi, or wheeze.. Cardiovascular Heart rhythm and rate regular, no murmur or gallop.. Pedal Pulses WNL. No clubbing, cyanosis or edema. Chest Breasts symmetical and no nipple discharge.. Breast tissue WNL, no masses, lumps, or tenderness.. Lymphatic No adneopathy. No adenopathy. No adenopathy. Musculoskeletal Adexa without tenderness or enlargement.. Digits and nails w/o clubbing, cyanosis, infection, petechiae, ischemia, or inflammatory conditions.. Integumentary (Hair, Skin) No suspicious lesions. No crepitus or fluctuance. No peri-wound warmth or erythema. No masses.Marland Kitchen Psychiatric Judgement and insight Intact.. No evidence of depression, anxiety, or agitation.. Notes the wound in the thigh continues to have some depth to it and I printed out with moist saline gauze. The wound which was on the lateral calf was sharply debrided with a #3 curet and bleeding controlled with pressure Electronic Signature(s) Signed: 02/12/2016 11:31:46 AM By: Christin Fudge MD,  FACS Entered By: Christin Fudge on 02/12/2016 11:31:45 Natasha [name] ([id number]) -------------------------------------------------------------------------------- Physician Orders Details Patient Name: Natasha [name] Date of Service: 02/12/2016 10:30 AM Medical Record Number: [id number] Patient Account Number: [id number] Date of Birth/Sex: [birth year] (83 y.o. Female) Treating RN: Baruch Gouty, RN, [name], Velva Harman Primary Care Provider: Emily Filbert Other Clinician: Referring Provider: Emily Filbert Treating Provider/Extender: Frann Rider in Treatment: 3 Verbal / Phone Orders: No Diagnosis Coding Wound Cleansing Wound #1 Left,Medial Upper Leg o Clean wound with Normal Saline. o Cleanse wound with mild soap and water Wound #2 Left,Proximal  Lower Leg o Clean wound with Normal Saline. o Cleanse wound with mild soap and water Wound #3 Left,Distal Lower Leg o Clean wound with Normal Saline. o Cleanse wound with mild soap and water Anesthetic Wound #1 Left,Medial Upper Leg o Topical Lidocaine 4% cream applied to wound bed prior to debridement - for clinic use Wound #2 Left,Proximal Lower Leg o Topical Lidocaine 4% cream applied to wound bed prior to debridement - for clinic use Wound #3 Left,Distal Lower Leg o Topical Lidocaine 4% cream applied to wound bed prior to debridement - for clinic use Primary Wound Dressing Wound #1 Left,Medial Upper Leg o Aquacel Ag - pack light into wound Wound #2 Left,Proximal Lower Leg o Medihoney gel Wound #3 Left,Distal Lower Leg o Medihoney gel Secondary Dressing Wound #1 Left,Medial Upper Leg o Boardered Foam Dressing Natasha [name], Natasha [name] ([id number]) Wound #2 Left,Proximal Lower Leg o ABD and Kerlix/Conform o Other - tape Wound #3 Left,Distal Lower Leg o ABD and Kerlix/Conform o Other - tape Dressing Change Frequency Wound #1 Left,Medial Upper Leg o Change dressing every other day. Wound #2  Left,Proximal Lower Leg o Change dressing every other day. Wound #3 Left,Distal Lower Leg o Change dressing every other day. Follow-up Appointments Wound #1 Left,Medial Upper Leg o Return Appointment in 1 week. Wound #2 Left,Proximal Lower Leg o Return Appointment in 1 week. Wound #3 Left,Distal Lower Leg o Return Appointment in 1 week. Edema Control Wound #1 Left,Medial Upper Leg o Elevate legs to the level of the heart and pump ankles as often as possible Wound #2 Left,Proximal Lower Leg o Elevate legs to the level of the heart and pump ankles as often as possible Wound #3 Left,Distal Lower Leg o Elevate legs to the level of the heart and pump ankles as often as possible Additional Orders / Instructions Wound #1 Left,Medial Upper Leg o Increase protein intake. Wound #2 Left,Proximal Lower Leg o Increase protein intake. Wound #3 Left,Distal Lower Leg o Increase protein intake. Natasha [name], Natasha [name] ([id number]) Patient Medications Allergies: codeine, hydrocodone, levofloxacin, tramadol Notifications Medication Indication Start End MediHoney (honey) 02/12/2016 DOSE topical 100 % paste - paste topical as directed Electronic Signature(s) Signed: 02/12/2016 11:34:30 AM By: Christin Fudge MD, FACS Entered By: Christin Fudge on 02/12/2016 11:34:30 Natasha [name] ([id number]) -------------------------------------------------------------------------------- Problem List Details Patient Name: Natasha [name] Date of Service: 02/12/2016 10:30 AM Medical Record Number: [id number] Patient Account Number: [id number] Date of Birth/Sex: [birth year] (83 y.o. 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 Female) Treating RN: Baruch Gouty, RN, [name], Velva Harman Primary Care Provider: Emily Filbert Other Clinician: Referring Provider: Emily Filbert Treating Provider/Extender: Frann Rider in Treatment: 3 Subjective Chief Complaint Information obtained from Patient Patient presents to the wound care center today with an open arterial ulcer along with diabetes  mellitus to the left lower extremity which she's had for over a month History of Present Illness (HPI) The following HPI elements were documented for the patient's wound: Location: left thigh and left calf in the area where there was previous vascular surgery Quality: Patient reports experiencing a dull pain to affected area(s). Severity: Patient states wound (s) are getting better. Duration: Patient has had the wound for > 2 months prior to seeking treatment at the wound center Timing: Pain in wound is Intermittent (comes and goes Context: The wound occurred when the patient had vascular surgery and the wounds were not healing very well Modifying Factors: Other treatment(s) tried include:local care and oral antibiotics Associated Signs and Symptoms: Patient reports having increase swelling. 83 year old female with significant past medical history of  anemia, cellulitis and abscess of the leg, COPD, coronary artery disease, hypertension, osteoarthritis, peripheral vascular disease with claudication, trigeminal neuralgia and type 2 diabetes mellitus without complications. A past surgical history significant for appendectomy, left femoral peroneal bypass graft in October 2017, and debridement of skin and subcutis tissue on the left side in December 2017. She was treated by Dr. Lucky Cowboy earlier, with multiple left leg revascularization both endovascular and open. She is a former smoker who quit 30 years ago. When she was last seen at [facility], at the vascular surgery clinic she had a left lower extremity 3 separate wounds along the saphenectomy site. She was placed on Bactrim DS for 10 days. She had a good signal over the peroneal artery and the bypass remains patent. Prior to surgery her left ABI was 0.46 and a right was 0.98. Note the patient was seen by Dr. Amalia Hailey of podiatry on 01/15/2016 and he saw for ulceration of her left great toe. Most recently the patient was seen by Dr. Maura Crandall, the vascular surgeon on January 5 and these notes are reviewed on Epic-- he noted that the 3 wounds on the left lower extremity are granulation very slowly, and he recommended packing with wet-to-dry Kerlix. The surgeon noted excellent signal over the peroneal artery and the recommendation was to see as at the wound center to help with healing. 02/12/2016 -- we have not seen her for 2 weeks and her husband tells me that she was admitted to the hospital with a pneumonia. I have reviewed a hospital notes, she was there between January 19 and January 22, with clinical sepsis and bilateral pneumonia, fever and leukocytosis and was treated with Natasha [name], Natasha [name]. ([id number]) vancomycin and Zosyn. Vancomycin was then stopped and Zithromax was added. He was discharged home on Augmentin Objective Constitutional Pulse regular. Respirations normal and unlabored.  Afebrile. Vitals Time Taken: 10:36 AM, Height: 59 in, Weight: 140 lbs, BMI: 28.3, Temperature: 97.6 F, Pulse: 83 bpm, Respiratory Rate: 16 breaths/min, Blood Pressure: 129/55 mmHg. Eyes Nonicteric. Reactive to light. Ears, Nose, Mouth, and Throat Lips, teeth, and gums WNL.Marland Kitchen Moist mucosa without lesions. Neck supple and nontender. No palpable supraclavicular or cervical adenopathy. Normal sized without goiter. Respiratory WNL. No retractions.. Breath sounds WNL, No rubs, rales, rhonchi, or wheeze.. Cardiovascular Heart rhythm and rate regular, no murmur or gallop.. Pedal Pulses WNL. No clubbing, cyanosis or edema. Chest Breasts symmetical and no nipple discharge.. Breast tissue WNL, no masses, lumps, or tenderness.. Lymphatic No adneopathy. No adenopathy. No adenopathy. Musculoskeletal Adexa without tenderness or enlargement.. Digits and nails w/o clubbing, cyanosis, infection, petechiae, ischemia, or inflammatory conditions.Marland Kitchen Psychiatric Judgement and insight Intact.. No evidence of depression, anxiety, or agitation.. General Notes: the wound in  the thigh continues to have some depth to it and I printed out with moist saline gauze. The wound which was on the lateral calf was sharply debrided with a #3 curet and bleeding controlled with pressure Natasha [name], Natasha [name]. ([id number]) Integumentary (Hair, Skin) No suspicious lesions. No crepitus or fluctuance. No peri-wound warmth or erythema. No masses.. Wound #1 status is Open. Original cause of wound was Surgical Injury. The wound is located on the Left,Medial Upper Leg. The wound measures 2.5cm length x 0.8cm width x 1.5cm depth; 1.571cm^2 area and 2.356cm^3 volume. There is muscle, Fat Layer (Subcutaneous Tissue) Exposed, and fascia exposed. There is no tunneling or undermining noted. There is a large amount of serosanguineous drainage noted. The wound margin is distinct with the outline attached to the wound base. There is large  (67-100%) red, pink granulation within the wound bed. There is a small (1-33%) amount of necrotic tissue within the wound bed including Adherent Slough. The periwound skin appearance did not exhibit: Erythema. Periwound temperature was noted as No Abnormality. The periwound has tenderness on palpation. Wound #2 status is Open. Original cause of wound was Surgical Injury. The wound is located on the Left,Proximal Lower Leg. The wound measures 1cm length x 0.3cm width x 0.1cm depth; 0.236cm^2 area and 0.024cm^3 volume. There is Fat Layer (Subcutaneous Tissue) Exposed and fascia exposed. There is no tunneling or undermining noted. There is a none present amount of drainage noted. The wound margin is distinct with the outline attached to the wound base. There is no granulation within the wound bed. There is a large (67-100%) amount of necrotic tissue within the wound bed including Eschar. The periwound skin appearance exhibited: Dry/Scaly. The periwound skin appearance did not exhibit: Induration, Erythema. Periwound temperature was noted as No Abnormality. Wound #3 status is Open. Original cause of wound was Surgical Injury. The wound is located on the Left,Distal Lower Leg. The wound measures 6.5cm length x 1.5cm width x 0.8cm depth; 7.658cm^2 area and 6.126cm^3 volume. There is muscle, Fat Layer (Subcutaneous Tissue) Exposed, and fascia exposed. There is no tunneling or undermining noted. There is a large amount of serosanguineous drainage noted. The wound margin is distinct with the outline attached to the wound base. There is medium (34-66%) red, pink granulation within the wound bed. There is a medium (34-66%) amount of necrotic tissue within the wound bed including Adherent Slough. The periwound skin appearance exhibited: Induration, Scarring. The periwound skin appearance did not exhibit: Erythema. Periwound temperature was noted as No Abnormality. The periwound has tenderness on  palpation. Assessment Active Problems ICD-10 I70.242 - Atherosclerosis 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ration of calf L97.222 - Non-pressure chronic ulcer of left calf with fat layer exposed L97.122 - 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 exposed Procedures Natasha [name], Natasha [name] ([id number]) Wound #3 Wound #3 is an Open Surgical Wound located on the Left,Distal Lower Leg . There was a Skin/Subcutaneous Tissue/Muscle Debridement ([id number]) debridement with total area of 9.75 sq cm performed by Christin Fudge, MD. with the following instrument(s): Curette to remove Non-Viable tissue/material including Exudate, Fibrin/Slough, Eschar, and Subcutaneous after achieving pain control using Lidocaine 4% Topical Solution. A time out was conducted at 11:00, prior to the start of the procedure. A Minimum amount of bleeding was controlled with Pressure. The procedure was tolerated well with a pain level of 0 throughout and a pain level of 0 following the procedure. Post Debridement Measurements: 6.5cm length x 1.5cm width x 1cm depth; 7.658cm^3  volume. Character of Wound/Ulcer Post Debridement requires further debridement. Severity of Tissue Post Debridement is: Fat layer exposed. Post procedure Diagnosis Wound #3: Same as Pre-Procedure Plan Wound Cleansing: Wound #1 Left,Medial Upper Leg: Clean wound with Normal Saline. Cleanse wound with mild soap and water Wound #2 Left,Proximal Lower Leg: Clean wound with Normal Saline. Cleanse wound with mild soap and water Wound #3 Left,Distal Lower Leg: Clean wound with Normal Saline. Cleanse wound with mild soap and water Anesthetic: Wound #1 Left,Medial Upper Leg: Topical Lidocaine 4% cream applied to wound bed prior to debridement - for clinic use Wound #2 Left,Proximal Lower Leg: Topical Lidocaine 4% cream applied to wound bed prior to debridement - for clinic use Wound #3 Left,Distal Lower Leg: Topical Lidocaine 4%  cream applied to wound bed prior to debridement - for clinic use Primary Wound Dressing: Wound #1 Left,Medial Upper Leg: Aquacel Ag - pack light into wound Wound #2 Left,Proximal Lower Leg: Medihoney gel Wound #3 Left,Distal Lower Leg: Medihoney gel Secondary Dressing: Wound #1 Left,Medial Upper Leg: Boardered Foam Dressing Wound #2 Left,Proximal Lower Leg: ABD and Kerlix/Conform Natasha [name], Natasha [name]. ([id number]) Other - tape Wound #3 Left,Distal Lower Leg: ABD and Kerlix/Conform Other - tape Dressing Change Frequency: Wound #1 Left,Medial Upper Leg: Change dressing every other day. Wound #2 Left,Proximal Lower Leg: Change dressing every other day. Wound #3 Left,Distal Lower Leg: Change dressing every other day. Follow-up Appointments: Wound #1 Left,Medial Upper Leg: Return Appointment in 1 week. Wound #2 Left,Proximal Lower Leg: Return Appointment in 1 week. Wound #3 Left,Distal Lower Leg: Return Appointment in 1 week. Edema Control: Wound #1 Left,Medial Upper Leg: Elevate legs to the level of the heart and pump ankles as often as possible Wound #2 Left,Proximal Lower Leg: Elevate legs to the level of the heart and pump ankles as often as possible Wound #3 Left,Distal Lower Leg: Elevate legs to the level of the heart and pump ankles as often as possible Additional Orders / Instructions: Wound #1 Left,Medial Upper Leg: Increase protein intake. Wound #2 Left,Proximal Lower Leg: Increase protein intake. Wound #3 Left,Distal Lower Leg: Increase protein intake. The following medication(s) was prescribed: MediHoney (honey) topical 100 % paste paste topical as directed starting 02/12/2016 I have recommended: 1. packing of the thigh wound with silver alginate and an appropriate foam border. 2. the wound lower down will be covered with Medihoney and an appropriate bordered foam 3. continue to see the vascular surgeons at [facility] as requested 4. Good amount of protein intake,  vitamin A, vitamin C and zinc 5. Regular visits to the wound center Natasha [name], NIKKEL ([id number]) Electronic Signature(s) Signed: 02/12/2016 11:34:58 AM By: Christin Fudge MD, FACS Previous Signature: 02/12/2016 11:33:22 AM Version By: Christin Fudge MD, FACS Entered By: Christin Fudge on 02/12/2016 11:34:58 Natasha [name] ([id number]) -------------------------------------------------------------------------------- SuperBill Details Patient Name: Natasha [name] Date of Service: 02/12/2016 Medical Record Number: [id number] Patient Account Number: [id number] Date of Birth/Sex: 09-Jun-1928 (83 y.o. 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 exposed Facility Procedures CPT4 Code Description: 63016010 11042 - DEB SUBQ TISSUE 20 SQ CM/< ICD-10 Description Diagnosis I70.242 Atherosclerosis of native arteries of left leg with  ulce I71.292 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 L97.222 Non-pressure chronic  ulcer of left calf with fat layer e Modifier: ration of ca exposed xposed Quantity: 1 lf Physician Procedures CPT4 Code Description: 9090301 99213 - WC PHYS LEVEL 3 - EST PT ICD-10 Description Diagnosis I70.242 Atherosclerosis 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 L97.222 Non-pressure chronic ulcer of left calf with fat layer e L97.122 Non-pressure chronic  ulcer of left thigh with fat layer Modifier: 25 ration of ca xposed exposed Quantity: 1 lf CPT4 Code Description: 4996924 11042 - WC PHYS SUBQ TISS 20 SQ CM ICD-10 Description  Diagnosis I70.242 Atherosclerosis 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 L97.122 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

## 2016-02-16 ENCOUNTER — Encounter: Payer: Medicare Other | Admitting: Surgery

## 2016-02-16 DIAGNOSIS — E11622 Type 2 diabetes mellitus with other skin ulcer: Secondary | ICD-10-CM | POA: Diagnosis not present

## 2016-02-17 NOTE — Progress Notes (Signed)
Natasha, [name] ([id number]) Visit Report for 02/16/2016 Arrival Information Details Patient Name: Natasha [name], Natasha [name] Date of Service: 02/16/2016 11:15 AM Medical Record Number: [id number] Patient Account Number: [id number] Date of Birth/Sex: February 20, 1928 (83 y.o. Female) Treating RN: Natasha [name] Primary Care Natasha [name]: Natasha [name] Other Clinician: Referring Natasha [name]: Natasha [name] Treating Natasha [name]/Extender: Natasha [name] in Treatment: 4 Visit Information History Since Last Visit Added or deleted any medications: No Patient Arrived: Natasha [name] Any new allergies or adverse reactions: No Arrival Time: 11:14 Had a fall or experienced change in No Accompanied By: husband activities of daily living that may affect Transfer Assistance: None risk of falls: Patient Identification Verified: Yes Signs or symptoms of abuse/neglect since last No Secondary Verification Process Yes visito Completed: Hospitalized since last visit: No Patient Requires Transmission- No Has Dressing in Place as Prescribed: Yes Based Precautions: Pain Present Now: No Patient Has Alerts: Yes Patient Alerts: Patient on Blood Thinner Plavix Electronic Signature(s) Signed: 02/16/2016 4:39:09 PM By: Natasha Cool, RN, [name], [name] RN, [name] Entered By: Natasha Cool, RN, [name], Natasha [name] on 02/16/2016 11:14:56 Natasha [name] ([id number]) -------------------------------------------------------------------------------- Encounter Discharge Information Details Patient Name: Natasha [name] Date of Service: 02/16/2016 11:15 AM Medical Record Number: [id number] Patient Account Number: [id number] Date of Birth/Sex: Aug 31, 1928 (83 y.o. Female) Treating RN: Natasha [name] Primary Care Coti Burd: Natasha [name] Other Clinician: Referring Nikaya Nasby: Natasha [name] Treating Katalina Magri/Extender: Natasha [name] in Treatment: 4 Encounter Discharge Information Items Discharge Pain Level: 0 Discharge Condition: Stable Ambulatory Status:  Walker Discharge Destination: Home Transportation: Private Auto Accompanied By: husband Schedule Follow-up Appointment: Yes Medication Reconciliation completed Yes and provided to Patient/Care Natasha [name]: Provided on Clinical Summary of Care: 02/16/2016 Form Type Recipient Paper Patient Natasha [name] Williamson Electronic Signature(s) Signed: 02/16/2016 4:39:09 PM By: Natasha Cool, RN, [name], [name] RN, [name] Previous Signature: 02/16/2016 11:52:21 AM Version By: Natasha [name] Entered By: Natasha Cool RN, [name], Natasha [name] on 02/16/2016 11:57:19 Natasha [name] ([id number]) -------------------------------------------------------------------------------- Lower Extremity Assessment Details Patient Name: Natasha [name] Date of Service: 02/16/2016 11:15 AM Medical Record Number: [id number] Patient Account Number: [id number] Date of Birth/Sex: [birth year] (83 y.o. Female) Treating RN: Natasha [name] Primary Care Duwayne Matters: Natasha [name] Other Clinician: Referring Kemora Pinard: Natasha [name] Treating Natasha [name]/Extender: Natasha [name] in Treatment: 4 Vascular Assessment Claudication: Claudication Assessment [Left:None] Pulses: Dorsalis Pedis Palpable: [Left:Yes] Posterior Tibial Palpable: [Left:Yes] Extremity colors, hair growth, and conditions: Extremity Color: [Left:Normal] Hair Growth on Extremity: [Left:Yes] Temperature of Extremity: [Left:Warm] Capillary Refill: [Left:< 3 seconds] Dependent Rubor: [Left:No] Blanched when Elevated: [Left:No] Lipodermatosclerosis: [Left:No] Toe Nail Assessment Left: Right: Thick: Yes Discolored: Yes Deformed: Yes Improper Length and Hygiene: Yes Electronic Signature(s) Signed: 02/16/2016 4:39:09 PM By: Natasha Cool, RN, [name], [name] RN, [name] Entered By: Natasha Cool, RN, [name], Natasha [name] on 02/16/2016 11:31:57 Natasha [name] ([id number]) -------------------------------------------------------------------------------- Multi Wound Chart Details Patient Name: Natasha [name] Date of Service: 02/16/2016  11:15 AM Medical Record Number: [id number] Patient Account Number: [id number] Date of Birth/Sex: 1929/01/09 (83 y.o. Female) Treating RN: Natasha [name] Primary Care Natasha [name]: Natasha [name] Other Clinician: Referring Natasha [name]: Natasha [name] Treating Natasha [name]/Extender: Natasha [name] in Treatment: 4 Vital Signs Height(in): 59 Pulse(bpm): 89 Weight(lbs): 140 Blood Pressure 140/58 (mmHg): Body Mass Index(BMI): 28 Temperature(F): 98.0 Respiratory Rate 16 (breaths/min): Photos: Wound Location: Left Upper Leg - Medial Left Lower Leg - Proximal Left Lower Leg - Distal Wounding Event: Surgical Injury Surgical Injury Surgical Injury Primary Etiology: Open Surgical Wound Open Surgical Wound Open Surgical Wound Comorbid History: Anemia, Asthma, Anemia, Asthma, Anemia, Asthma, Hypertension, Peripheral Hypertension, Peripheral Hypertension, Peripheral Venous Disease, Venous Disease,  Venous Disease, Osteoarthritis, Neuropathy Osteoarthritis, Neuropathy Osteoarthritis, Neuropathy Date Acquired: 12/04/2015 12/04/2015 12/04/2015 Weeks of Treatment: '4 4 4 ' Wound Status: Open Open Open Measurements L x W x D 2x0.5x0.9 0.7x0.2x0.1 6.1x1.4x0.5 (cm) Area (cm) : 0.785 0.11 6.707 Volume (cm) : 0.707 0.011 3.354 % Reduction in Area: 16.70% 80.50% 62.00% % Reduction in Volume: 46.40% 97.80% 72.90% Position 1 (o'clock): 12 Maximum Distance 1 1 (cm): Tunneling: Yes No N/A Classification: Full Thickness With Full Thickness With Full Thickness With Exposed Support Exposed Support Exposed Support Structures Structures Structures Exudate Amount: Medium None Present Large Natasha, [name] ([id number]) Exudate Type: Serosanguineous N/A Serosanguineous Exudate Color: red, brown N/A red, brown Wound Margin: Epibole Distinct, outline attached Epibole Granulation Amount: Large (67-100%) None Present (0%) Small (1-33%) Granulation Quality: Red, Pink N/A Red, Pink, Hyper- granulation Necrotic  Amount: Small (1-33%) Large (67-100%) Large (67-100%) Necrotic Tissue: Adherent Natasha [name] Exposed Structures: Fat Layer (Subcutaneous Fat Layer (Subcutaneous Fat Layer (Subcutaneous Tissue) Exposed: Yes Tissue) Exposed: Yes Tissue) Exposed: Yes Fascia: No Fascia: No Fascia: No Tendon: No Tendon: No Tendon: No Muscle: No Muscle: No Muscle: No Joint: No Joint: No Joint: No Bone: No Bone: No Bone: No Epithelialization: Small (1-33%) None None Debridement: N/A Open Wound/Selective Debridement ([id number]) - Selective 11047) Pre-procedure N/A 11:35 11:35 Verification/Time Out Taken: Pain Control: N/A Other Other Tissue Debrided: N/A Necrotic/Eschar Exudates, Subcutaneous Level: N/A Non-Viable Tissue Skin/Subcutaneous Tissue Debridement Area (sq N/A 0.14 8.54 cm): Instrument: N/A Forceps Curette Bleeding: N/A Minimum Minimum Hemostasis Achieved: N/A Pressure Pressure Procedural Pain: N/A 2 2 Post Procedural Pain: N/A 2 2 Debridement Treatment N/A Procedure was tolerated Procedure was tolerated Response: well well Post Debridement N/A 0.7x0.2x0.1 6.1x1.4x0.6 Measurements L x W x D (cm) Post Debridement N/A 0.011 4.024 Volume: (cm) Periwound Skin Texture: No Abnormalities Noted Excoriation: Yes Scarring: Yes Induration: Yes Excoriation: No Callus: Yes Induration: No Crepitus: Yes Callus: No Rash: Yes Crepitus: No Scarring: Yes Rash: No Periwound Skin No Abnormalities Noted Maceration: Yes Maceration: No Moisture: Dry/Scaly: Yes Dry/Scaly: No Periwound Skin Color: Erythema: No Atrophie Blanche: Yes Atrophie Blanche: No Cyanosis: Yes Cyanosis: No Ecchymosis: Yes Ecchymosis: No SHANTANIQUE, HODO ([id number]) Erythema: Yes Erythema: No Hemosiderin Staining: Yes Hemosiderin Staining: No Mottled: Yes Mottled: No Pallor: Yes Pallor: No Rubor: Yes Rubor: No Temperature: No Abnormality No Abnormality No  Abnormality Tenderness on Yes No Yes Palpation: Wound Preparation: Ulcer Cleansing: Ulcer Cleansing: Ulcer Cleansing: Rinsed/Irrigated with Rinsed/Irrigated with Rinsed/Irrigated with Saline Saline Saline Topical Anesthetic Topical Anesthetic Topical Anesthetic Applied: Other: lidocaine Applied: Other: lidocaine Applied: Other: lidocaine 4% 4% 4% Procedures Performed: N/A Debridement Debridement Treatment Notes Electronic Signature(s) Signed: 02/16/2016 11:46:43 AM By: Christin Fudge MD, FACS Entered By: Christin Fudge on 02/16/2016 11:46:42 Natasha [name] ([id number]) -------------------------------------------------------------------------------- [place] Details Patient Name: Natasha [name] Date of Service: 02/16/2016 11:15 AM Medical Record Number: [id number] Patient Account Number: [id number] Date of Birth/Sex: [birth year] (83 y.o. Female) Treating RN: Natasha [name] Primary Care Albertha Beattie: Natasha [name] Other Clinician: Referring Braedan Meuth: Natasha [name] Treating Jaide Hillenburg/Extender: Natasha [name] in Treatment: 4 Active Inactive ` Abuse / Safety / Falls / Self Care Management Nursing Diagnoses: Potential for falls Goals: Patient will remain injury free Date Initiated: 01/19/2016 Target Resolution Date: 05/25/2016 Goal Status: Active Interventions: Assess fall risk on admission and as needed Assess: immobility, friction, shearing, incontinence upon admission and as needed Assess impairment of mobility on admission and as needed per policy Notes: ` Nutrition Nursing Diagnoses: Imbalanced nutrition Goals: Patient/caregiver agrees  to and verbalizes understanding of need to use nutritional supplements and/or vitamins as prescribed Date Initiated: 01/19/2016 Target Resolution Date: 05/25/2016 Goal Status: Active Interventions: Assess patient nutrition upon admission and as needed per policy Notes: ` Orientation to the [place], [place] ([id number]) Nursing Diagnoses: Knowledge deficit related to the wound healing [name] program Goals: Patient/caregiver will verbalize understanding of the [place] Date Initiated: 01/19/2016 Target Resolution Date: 05/25/2016 Goal Status: Active Interventions: Provide education on orientation to the wound [name] Notes: ` Pain, Acute or Chronic Nursing Diagnoses: Pain, acute or chronic: actual or potential Potential alteration in comfort, pain Goals: Patient will verbalize adequate pain control and receive pain control interventions during procedures as needed Date Initiated: 01/19/2016 Target Resolution Date: 05/25/2016 Goal Status: Active Patient/caregiver will verbalize adequate pain control between visits Date Initiated: 01/19/2016 Target Resolution Date: 05/25/2016 Goal Status: Active Patient/caregiver will verbalize comfort level met Date Initiated: 01/19/2016 Target Resolution Date: 05/25/2016 Goal Status: Active Interventions: Assess comfort goal upon admission Complete pain assessment as per visit requirements Notes: ` Wound/Skin Impairment Nursing Diagnoses: Impaired tissue integrity Knowledge deficit related to ulceration/compromised skin integrity Goals: YALENA, COLON ([id number]) Ulcer/skin breakdown will have a volume reduction of 30% by week 4 Date Initiated: 01/19/2016 Target Resolution Date: 05/25/2016 Goal Status: Active Ulcer/skin breakdown will have a volume reduction of 50% by week 8 Date Initiated: 01/19/2016 Target Resolution Date: 05/25/2016 Goal Status: Active Ulcer/skin breakdown will have a volume reduction of 80% by week 12 Date Initiated: 01/19/2016 Target Resolution Date: 05/25/2016 Goal Status: Active Interventions: Assess patient/caregiver ability to perform ulcer/skin care regimen upon admission and as needed Assess ulceration(s) every visit Notes: Electronic Signature(s) Signed: 02/16/2016  4:39:09 PM By: Natasha Cool, RN, [name], [name] RN, [name] Entered By: Natasha Cool, RN, [name], Natasha [name] on 02/16/2016 11:32:02 Natasha [name] ([id number]) -------------------------------------------------------------------------------- Pain Assessment Details Patient Name: Natasha [name] Date of Service: 02/16/2016 11:15 AM Medical Record Number: [id number] Patient Account Number: [id number] Date of Birth/Sex: 06/19/1928 (83 y.o. Female) Treating RN: Natasha [name] Primary Care Karl Erway: Natasha [name] Other Clinician: Referring Nikolaus Pienta: Natasha [name] Treating Aubrynn Katona/Extender: Natasha [name] in Treatment: 4 Active Problems Location of Pain Severity and Description of Pain Patient Has Paino No Site Locations With Dressing Change: No Pain Management and Medication Current Pain Management: Goals for Pain Management walking makes it hurt Electronic Signature(s) Signed: 02/16/2016 4:39:09 PM By: Natasha Cool, RN, [name], [name] RN, [name] Entered By: Natasha Cool, RN, [name], Natasha [name] on 02/16/2016 11:15:27 Natasha [name] ([id number]) -------------------------------------------------------------------------------- Patient/Caregiver Education Details Patient Name: Natasha [name] Date of Service: 02/16/2016 11:15 AM Medical Record Number: [id number] Patient Account Number: [id number] Date of Birth/Gender: 1928/02/20 (83 y.o. Female) Treating RN: Natasha [name] Primary Care Physician: Natasha [name] Other Clinician: Referring Physician: Emily [name] Treating Physician/Extender: Natasha [name] in Treatment: 4 Education Assessment Education Provided To: Patient Education Topics Provided Wound/Skin Impairment: Handouts: Caring for Your Ulcer, Other: wound care as prescribed Methods: Demonstration, Explain/Verbal Responses: State content correctly Electronic Signature(s) Signed: 02/16/2016 4:39:09 PM By: Natasha Cool, RN, [name], [name] RN, [name] Entered By: Natasha Cool, RN, [name], Natasha [name] on 02/16/2016 11:57:41 Natasha [name]  ([id number]) -------------------------------------------------------------------------------- Wound Assessment Details Patient Name: Natasha [name] Date of Service: 02/16/2016 11:15 AM Medical Record Number: [id number] Patient Account Number: [id number] Date of Birth/Sex: 1929-01-02 (83 y.o. Female) Treating RN: Natasha [name] Primary Care Renise Gillies: Natasha [name] Other Clinician: Referring Merrianne Mccumbers: Natasha [name] Treating Oniyah Rohe/Extender: Natasha [name] in Treatment: 4 Wound Status Wound Number: 1 Primary Open Surgical Wound Etiology:  Wound Location: Left Upper Leg - Medial Wound Open Wounding Event: Surgical Injury Status: Date Acquired: 12/04/2015 Comorbid Anemia, Asthma, Hypertension, Weeks Of Treatment: 4 History: Peripheral Venous Disease, Clustered Wound: No Osteoarthritis, Neuropathy Photos Wound Measurements Length: (cm) 2 Width: (cm) 0.5 Depth: (cm) 0.9 Area: (cm) 0.785 Volume: (cm) 0.707 % Reduction in Area: 16.7% % Reduction in Volume: 46.4% Epithelialization: Small (1-33%) Tunneling: Yes Position (o'clock): 12 Maximum Distance: (cm) 1 Undermining: No Wound Description Full Thickness With Exposed Foul Odor Aft Classification: Support Structures Slough/Fibrin Wound Margin: Epibole Exudate Medium Amount: Exudate Type: Serosanguineous Exudate Color: red, brown er Cleansing: No o Yes Wound Bed Granulation Amount: Large (67-100%) Exposed Structure Granulation Quality: Red, Pink Fascia Exposed: No FELICITA, NUNCIO ([id number]) Necrotic Amount: Small (1-33%) Fat Layer (Subcutaneous Tissue) Exposed: Yes Necrotic Quality: Adherent Slough Tendon Exposed: No Muscle Exposed: No Joint Exposed: No Bone Exposed: No Periwound Skin Texture Texture Color No Abnormalities Noted: No No Abnormalities Noted: No Erythema: No Moisture No Abnormalities Noted: No Temperature / Pain Temperature: No Abnormality Tenderness on Palpation: Yes Wound  Preparation Ulcer Cleansing: Rinsed/Irrigated with Saline Topical Anesthetic Applied: Other: lidocaine 4%, Treatment Notes Wound #1 (Left, Medial Upper Leg) 1. Cleansed with: Clean wound with Normal Saline 4. Dressing Applied: Aquacel Ag Medihoney Gel 5. Secondary Dressing Applied Bordered Foam Dressing Gauze and Kerlix/Conform Electronic Signature(s) Signed: 02/16/2016 4:39:09 PM By: Natasha Cool, RN, [name], [name] RN, [name] Entered By: Natasha Cool, RN, [name], Natasha [name] on 02/16/2016 11:25:03 Natasha [name] ([id number]) -------------------------------------------------------------------------------- Wound Assessment Details Patient Name: Natasha [name] Date of Service: 02/16/2016 11:15 AM Medical Record Number: [id number] Patient Account Number: [id number] Date of Birth/Sex: 1929/01/05 (83 y.o. Female) Treating RN: Natasha [name] Primary Care Alazia Crocket: Natasha [name] Other Clinician: Referring Lateya Dauria: Natasha [name] Treating Claryce Friel/Extender: Natasha [name] in Treatment: 4 Wound Status Wound Number: 2 Primary Open Surgical Wound Etiology: Wound Location: Left Lower Leg - Proximal Wound Open Wounding Event: Surgical Injury Status: Date Acquired: 12/04/2015 Comorbid Anemia, Asthma, Hypertension, Weeks Of Treatment: 4 History: Peripheral Venous Disease, Clustered Wound: No Osteoarthritis, Neuropathy Photos Wound Measurements Length: (cm) 0.7 Width: (cm) 0.2 Depth: (cm) 0.1 Area: (cm) 0.11 Volume: (cm) 0.011 % Reduction in Area: 80.5% % Reduction in Volume: 97.8% Epithelialization: None Tunneling: No Undermining: No Wound Description Full Thickness With Exposed Classification: Support Structures Wound Margin: Distinct, outline attached Exudate None Present Amount: Foul Odor After Cleansing: No Slough/Fibrino Yes Wound Bed Granulation Amount: None Present (0%) Exposed Structure Necrotic Amount: Large (67-100%) Fascia Exposed: No Necrotic Quality: Eschar Fat Layer  (Subcutaneous Tissue) Exposed: Yes Tendon Exposed: No Muscle Exposed: No Joint Exposed: No Bone Exposed: No ANEL, CREIGHTON. ([id number]) Periwound Skin Texture Texture Color No Abnormalities Noted: No No Abnormalities Noted: No Callus: Yes Atrophie Blanche: Yes Crepitus: Yes Cyanosis: Yes Excoriation: Yes Ecchymosis: Yes Induration: Yes Erythema: Yes Rash: Yes Hemosiderin Staining: Yes Scarring: Yes Mottled: Yes Pallor: Yes Moisture Rubor: Yes No Abnormalities Noted: No Dry / Scaly: Yes Temperature / Pain Maceration: Yes Temperature: No Abnormality Wound Preparation Ulcer Cleansing: Rinsed/Irrigated with Saline Topical Anesthetic Applied: Other: lidocaine 4%, Treatment Notes Wound #2 (Left, Proximal Lower Leg) 1. Cleansed with: Clean wound with Normal Saline 4. Dressing Applied: Aquacel Ag Medihoney Gel 5. Secondary Dressing Applied Bordered Foam Dressing Gauze and Kerlix/Conform Electronic Signature(s) Signed: 02/16/2016 4:39:09 PM By: Natasha Cool, RN, [name], [name] RN, [name] Entered By: Natasha Cool, RN, [name], Natasha [name] on 02/16/2016 11:26:01 Natasha [name] ([id number]) -------------------------------------------------------------------------------- Wound Assessment Details Patient Name: Natasha [name] Date of Service: 02/16/2016 11:15 AM  Medical Record Number: [id number] Patient Account Number: [id number] Date of Birth/Sex: December 19, 1928 (83 y.o. Female) Treating RN: Natasha [name] Primary Care Merina Behrendt: Natasha [name] Other Clinician: Referring Malyia Moro: Natasha [name] Treating Glendora Clouatre/Extender: Natasha [name] in Treatment: 4 Wound Status Wound Number: 3 Primary Open Surgical Wound Etiology: Wound Location: Left Lower Leg - Distal Wound Open Wounding Event: Surgical Injury Status: Date Acquired: 12/04/2015 Comorbid Anemia, Asthma, Hypertension, Weeks Of Treatment: 4 History: Peripheral Venous Disease, Clustered Wound: No Osteoarthritis, Neuropathy Photos Wound  Measurements Length: (cm) 6.1 Width: (cm) 1.4 Depth: (cm) 0.5 Area: (cm) 6.707 Volume: (cm) 3.354 % Reduction in Area: 62% % Reduction in Volume: 72.9% Epithelialization: None Undermining: No Wound Description Full Thickness With Exposed Classification: Support Structures Wound Margin: Epibole Exudate Large Amount: Exudate Type: Serosanguineous Exudate Color: red, brown Foul Odor After Cleansing: No Slough/Fibrino Yes Wound Bed Granulation Amount: Small (1-33%) Exposed Structure Granulation Quality: Red, Pink, Hyper-granulation Fascia Exposed: No Necrotic Amount: Large (67-100%) Fat Layer (Subcutaneous Tissue) Exposed: Yes Necrotic Quality: Adherent Slough Tendon Exposed: No Muscle Exposed: No MARDELLE, PANDOLFI ([id number]) Joint Exposed: No Bone Exposed: No Periwound Skin Texture Texture Color No Abnormalities Noted: No No Abnormalities Noted: No Callus: No Atrophie Blanche: No Crepitus: No Cyanosis: No Excoriation: No Ecchymosis: No Induration: No Erythema: No Rash: No Hemosiderin Staining: No Scarring: Yes Mottled: No Pallor: No Moisture Rubor: No No Abnormalities Noted: No Dry / Scaly: No Temperature / Pain Maceration: No Temperature: No Abnormality Tenderness on Palpation: Yes Wound Preparation Ulcer Cleansing: Rinsed/Irrigated with Saline Topical Anesthetic Applied: Other: lidocaine 4%, Treatment Notes Wound #3 (Left, Distal Lower Leg) 1. Cleansed with: Clean wound with Normal Saline 4. Dressing Applied: Aquacel Ag Medihoney Gel 5. Secondary Dressing Applied Bordered Foam Dressing Gauze and Kerlix/Conform Electronic Signature(s) Signed: 02/16/2016 4:39:09 PM By: Natasha Cool, RN, [name], [name] RN, [name] Entered By: Natasha Cool, RN, [name], Natasha [name] on 02/16/2016 11:26:54 Natasha [name] ([id number]) -------------------------------------------------------------------------------- [place] Details Patient Name: Natasha [name] Date of Service:  02/16/2016 11:15 AM Medical Record Number: [id number] Patient Account Number: [id number] Date of Birth/Sex: Dec 28, 1928 (83 y.o. Female) Treating RN: Natasha [name] Primary Care Swayzie Choate: Natasha [name] Other Clinician: Referring Analeise Mccleery: Natasha [name] Treating Angi Goodell/Extender: Natasha [name] in Treatment: 4 Vital Signs Time Taken: 11:15 Temperature (F): 98.0 Height (in): 59 Pulse (bpm): 89 Weight (lbs): 140 Respiratory Rate (breaths/min): 16 Body Mass Index (BMI): 28.3 Blood Pressure (mmHg): 140/58 Reference Range: 80 - 120 mg / dl Electronic Signature(s) Signed: 02/16/2016 4:39:09 PM By: Natasha Cool, RN, [name], [name] RN, [name] Entered By: Natasha Cool, RN, [name], Natasha [name] on 02/16/2016 11:15:43

## 2016-02-17 NOTE — Progress Notes (Signed)
NICHOEL, DIGIULIO ([id number]) Visit Report for 02/16/2016 Chief Complaint Document Details Patient Name: Natasha [name], Natasha [name] Date of Service: 02/16/2016 11:15 AM Medical Record Number: [id number] Patient Account Number: [id number] Date of Birth/Sex: [birth year] (83 y.o. Female) Treating RN: Cornell Barman Primary Care Provider: Emily Filbert Other Clinician: Referring Provider: Emily Filbert Treating Provider/Extender: Frann Rider in Treatment: 4 Information Obtained from: Patient Chief Complaint Patient presents to the wound care center today with an open arterial ulcer along with diabetes mellitus to the left lower extremity which she's had for over a month Electronic Signature(s) Signed: 02/16/2016 11:47:08 AM By: Christin Fudge MD, FACS Entered By: Christin Fudge on 02/16/2016 11:47:08 Natasha [name] ([id number]) -------------------------------------------------------------------------------- Debridement Details Patient Name: Natasha [name] Date of Service: 02/16/2016 11:15 AM Medical Record Number: [id number] Patient Account Number: [id number] Date of Birth/Sex: [birth year] (83 y.o. Female) Treating RN: Cornell Barman Primary Care Provider: Emily Filbert Other Clinician: Referring Provider: Emily Filbert Treating Provider/Extender: Frann Rider in Treatment: 4 Debridement Performed for Wound #2 Left,Proximal Lower Leg Assessment: Performed By: Physician Christin Fudge, MD Debridement: Open Wound/Selective Debridement Selective Description: Pre-procedure Yes - 11:35 Verification/Time Out Taken: Start Time: 11:36 Pain Control: Other : lidocaine 4% Level: Non-Viable Tissue Total Area Debrided (L x 0.7 (cm) x 0.2 (cm) = 0.14 (cm) W): Tissue and other Viable, Non-Viable, Eschar material debrided: Instrument: Forceps Bleeding: Minimum Hemostasis Achieved: Pressure End Time: 11:40 Procedural Pain: 2 Post Procedural Pain: 2 Response to Treatment: Procedure  was tolerated well Post Debridement Measurements of Total Wound Length: (cm) 0.7 Width: (cm) 0.2 Depth: (cm) 0.1 Volume: (cm) 0.011 Character of Wound/Ulcer Post Requires Further Debridement Debridement: Severity of Tissue Post Debridement: Fat layer exposed Post Procedure Diagnosis Same as Pre-procedure Electronic Signature(s) Signed: 02/16/2016 11:46:55 AM By: Christin Fudge MD, FACS Signed: 02/16/2016 4:39:09 PM By: [name] RN, [name], [name] RN, [name] [place], [place] ([id number]) Entered By: Christin Fudge on 02/16/2016 11:46:54 Natasha [name] ([id number]) -------------------------------------------------------------------------------- Debridement Details Patient Name: Natasha [name] Date of Service: 02/16/2016 11:15 AM Medical Record Number: [id number] Patient Account Number: [id number] Date of Birth/Sex: 1928/05/09 (83 y.o. Female) Treating RN: Cornell Barman Primary Care Provider: Emily Filbert Other Clinician: Referring Provider: Emily Filbert Treating Provider/Extender: Frann Rider in Treatment: 4 Debridement Performed for Wound #3 Left,Distal Lower Leg Assessment: Performed By: Physician Christin Fudge, MD Debridement: Debridement Pre-procedure Yes - 11:35 Verification/Time Out Taken: Start Time: 11:36 Pain Control: Other : lidocaine 4% Level: Skin/Subcutaneous Tissue Total Area Debrided (L x 6.1 (cm) x 1.4 (cm) = 8.54 (cm) W): Tissue and other Viable, Non-Viable, Exudate, Subcutaneous material debrided: Instrument: Curette Bleeding: Minimum Hemostasis Achieved: Pressure End Time: 11:40 Procedural Pain: 2 Post Procedural Pain: 2 Response to Treatment: Procedure was tolerated well Post Debridement Measurements of Total Wound Length: (cm) 6.1 Width: (cm) 1.4 Depth: (cm) 0.6 Volume: (cm) 4.024 Character of Wound/Ulcer Post Requires Further Debridement Debridement: Severity of Tissue Post Debridement: Fat layer exposed Post Procedure  Diagnosis Same as Pre-procedure Electronic Signature(s) Signed: 02/16/2016 11:47:02 AM By: Christin Fudge MD, FACS Signed: 02/16/2016 4:39:09 PM By: [name] RN, [name], [name] RN, [name] Entered By: Christin Fudge on 02/16/2016 11:47:01 Natasha [name], Natasha [name] ([id number]) Natasha [name], Natasha [name] ([id number]) -------------------------------------------------------------------------------- HPI Details Patient Name: Natasha [name] Date of Service: 02/16/2016 11:15 AM Medical Record Number: [id number] Patient Account Number: [id number] Date of Birth/Sex: 02/22/1928 (83 y.o. Female) Treating RN: Cornell Barman Primary Care Provider: Emily Filbert Other Clinician: Referring Provider: Emily Filbert Treating Provider/Extender: Frann Rider in Treatment: 4 History  of Present Illness Location: left thigh and left calf in the area where there was previous vascular surgery Quality: Patient reports experiencing a dull pain to affected area(s). Severity: Patient states wound (s) are getting better. Duration: Patient has had the wound for > 2 months prior to seeking treatment at the wound center Timing: Pain in wound is Intermittent (comes and goes Context: The wound occurred when the patient had vascular surgery and the wounds were not healing very well Modifying Factors: Other treatment(s) tried include:local care and oral antibiotics Associated Signs and Symptoms: Patient reports having increase swelling. HPI Description: 83 year old female with significant past medical history of anemia, cellulitis and abscess of the leg, COPD, coronary artery disease, hypertension, osteoarthritis, peripheral vascular disease with claudication, trigeminal neuralgia and type 2 diabetes mellitus without complications. A past surgical history significant for appendectomy, left femoral o peroneal bypass graft in October 2017, and debridement of skin and subcutis tissue on the left side in December 2017. She was treated by Dr. Lucky Cowboy  earlier, with multiple left leg revascularization both endovascular and open. She is a former smoker who quit 30 years ago. When she was last seen at [facility], at the vascular surgery clinic she had a left lower extremity 3 separate wounds along the saphenectomy site. She was placed on Bactrim DS for 10 days. She had a good signal over the peroneal artery and the bypass remains patent. Prior to surgery her left ABI was 0.46 and a right was 0.98. Note the patient was seen by Dr. Amalia Hailey of podiatry on 01/15/2016 and he saw for ulceration of her left great toe. Most recently the patient was seen by Dr. Maura Crandall, the vascular surgeon on January 5 and these notes are reviewed on Epic-- he noted that the 3 wounds on the left lower extremity are granulation very slowly, and he recommended packing with wet-to-dry Kerlix. The surgeon noted excellent signal over the peroneal artery and the recommendation was to see as at the wound center to help with healing. 02/12/2016 -- we have not seen her for 2 weeks and her husband tells me that she was admitted to the hospital with a pneumonia. I have reviewed a hospital notes, she was there between January 19 and January 22, with clinical sepsis and bilateral pneumonia, fever and leukocytosis and was treated with vancomycin and Zosyn. Vancomycin was then stopped and Zithromax was added. He was discharged home on Augmentin Electronic Signature(s) Signed: 02/16/2016 11:47:14 AM By: Christin Fudge MD, FACS Entered By: Christin Fudge on 02/16/2016 11:47:14 Natasha [name] ([id number]) -------------------------------------------------------------------------------- Physical Exam Details Patient Name: Natasha [name] Date of Service: 02/16/2016 11:15 AM Medical Record Number: [id number] Patient Account Number: [id number] Date of Birth/Sex: 11/04/1928 (83 y.o. Female) Treating RN: Cornell Barman Primary Care Provider: Emily Filbert Other Clinician: Referring  Provider: Emily Filbert Treating Provider/Extender: Frann Rider in Treatment: 4 Constitutional . Pulse regular. Respirations normal and unlabored. Afebrile. . Eyes Nonicteric. Reactive to light. Ears, Nose, Mouth, and Throat Lips, teeth, and gums WNL.Marland Kitchen Moist mucosa without lesions. Neck supple and nontender. No palpable supraclavicular or cervical adenopathy. Normal sized without goiter. Respiratory WNL. No retractions.. Cardiovascular Pedal Pulses WNL. No clubbing, cyanosis or edema. Lymphatic No adneopathy. No adenopathy. No adenopathy. Musculoskeletal Adexa without tenderness or enlargement.. Digits and nails w/o clubbing, cyanosis, infection, petechiae, ischemia, or inflammatory conditions.. Integumentary (Hair, Skin) No suspicious lesions. No crepitus or fluctuance. No peri-wound warmth or erythema. No masses.Marland Kitchen Psychiatric Judgement and insight Intact.. No evidence of depression, anxiety,  or agitation.. Notes the wound on the left medial calf area was sharply debrided with a #3 curet and minimal bleeding controlled with pressure. A left thigh wound is looking fairly clean and was not debrided Electronic Signature(s) Signed: 02/16/2016 11:48:12 AM By: Christin Fudge MD, FACS Entered By: Christin Fudge on 02/16/2016 11:48:12 Natasha [name] ([id number]) -------------------------------------------------------------------------------- Physician Orders Details Patient Name: Natasha [name] Date of Service: 02/16/2016 11:15 AM Medical Record Number: [id number] Patient Account Number: [id number] Date of Birth/Sex: 27-Jul-1928 (83 y.o. Female) Treating RN: Cornell Barman Primary Care Provider: Emily Filbert Other Clinician: Referring Provider: Emily Filbert Treating Provider/Extender: Frann Rider in Treatment: 4 Verbal / Phone Orders: No Diagnosis Coding Wound Cleansing Wound #1 Left,Medial Upper Leg o Clean wound with Normal Saline. o Cleanse wound with  mild soap and water Wound #2 Left,Proximal Lower Leg o Clean wound with Normal Saline. o Cleanse wound with mild soap and water Wound #3 Left,Distal Lower Leg o Clean wound with Normal Saline. o Cleanse wound with mild soap and water Anesthetic Wound #1 Left,Medial Upper Leg o Topical Lidocaine 4% cream applied to wound bed prior to debridement - for clinic use Wound #2 Left,Proximal Lower Leg o Topical Lidocaine 4% cream applied to wound bed prior to debridement - for clinic use Wound #3 Left,Distal Lower Leg o Topical Lidocaine 4% cream applied to wound bed prior to debridement - for clinic use Primary Wound Dressing Wound #1 Left,Medial Upper Leg o Aquacel Ag - pack light into wound Wound #2 Left,Proximal Lower Leg o Medihoney gel Wound #3 Left,Distal Lower Leg o Medihoney gel Secondary Dressing Wound #1 Left,Medial Upper Leg o Boardered Foam Dressing Natasha [name], Natasha [name] ([id number]) Wound #2 Left,Proximal Lower Leg o ABD and Kerlix/Conform o Other - tape Wound #3 Left,Distal Lower Leg o ABD and Kerlix/Conform o Other - tape Dressing Change Frequency Wound #1 Left,Medial Upper Leg o Change dressing every other day. Wound #2 Left,Proximal Lower Leg o Change dressing every other day. Wound #3 Left,Distal Lower Leg o Change dressing every other day. Follow-up Appointments Wound #1 Left,Medial Upper Leg o Return Appointment in 1 week. Wound #2 Left,Proximal Lower Leg o Return Appointment in 1 week. Wound #3 Left,Distal Lower Leg o Return Appointment in 1 week. Edema Control Wound #1 Left,Medial Upper Leg o Elevate legs to the level of the heart and pump ankles as often as possible Wound #2 Left,Proximal Lower Leg o Elevate legs to the level of the heart and pump ankles as often as possible Wound #3 Left,Distal Lower Leg o Elevate legs to the level of the heart and pump ankles as often as possible Additional Orders /  Instructions Wound #1 Left,Medial Upper Leg o Increase protein intake. Wound #2 Left,Proximal Lower Leg o Increase protein intake. Wound #3 Left,Distal Lower Leg o Increase protein intake. Natasha [name], Natasha [name] ([id number]) Electronic Signature(s) Signed: 02/16/2016 4:07:26 PM By: Christin Fudge MD, FACS Signed: 02/16/2016 4:39:09 PM By: [name] RN, [name], [name] RN, [name] Entered By: [name], RN, [name], [name] on 02/16/2016 11:42:29 KAMARI, BILEK ([id number]) -------------------------------------------------------------------------------- Problem List Details Patient Name: Natasha [name] Date of Service: 02/16/2016 11:15 AM Medical Record Number: [id number] Patient Account Number: [id number] Date of Birth/Sex: 09/20/1928 (83 y.o. 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 Female) Treating RN: Cornell Barman Primary Care Provider: Emily Filbert Other Clinician: Referring Provider: Emily Filbert Treating Provider/Extender: Frann Rider  in Treatment: 4 Subjective Chief Complaint Information obtained from Patient Patient presents to the wound care center today with an open arterial ulcer along with diabetes mellitus to the left lower extremity which she's had for over a month History of Present Illness (HPI) The following HPI elements were documented for the patient's wound: Location: left thigh and left calf in the area where there was previous vascular surgery Quality: Patient reports experiencing a dull pain to affected area(s). Severity: Patient states wound (s) are getting better. Duration: Patient has had the wound for > 2 months prior to seeking treatment at the wound center Timing: Pain in wound is Intermittent (comes and goes Context: The wound occurred when the patient had vascular surgery and the wounds were not healing very well Modifying Factors: Other treatment(s) tried include:local care and oral antibiotics Associated Signs and Symptoms: Patient reports having increase swelling. 83 year old female with significant past medical history of anemia, cellulitis and abscess of the leg, COPD, coronary artery disease, hypertension, osteoarthritis, peripheral vascular disease with claudication, trigeminal neuralgia and type 2 diabetes mellitus without complications. A past surgical history significant for appendectomy, left femoral peroneal bypass graft in October 2017, and debridement of skin and subcutis tissue on the left side in December 2017. She was treated by Dr. Lucky Cowboy earlier, with multiple left leg revascularization both endovascular and open. She is a former smoker who quit 30 years ago. When she was last seen at [facility], at the vascular surgery clinic she had a left lower extremity 3 separate wounds along the saphenectomy site. She was placed on Bactrim DS for 10 days. She had a good signal over the peroneal artery and the bypass remains patent. Prior to surgery her left ABI was 0.46 and a right was  0.98. Note the patient was seen by Dr. Amalia Hailey of podiatry on 01/15/2016 and he saw for ulceration of her left great toe. Most recently the patient was seen by Dr. Maura Crandall, the vascular surgeon on January 5 and these notes are reviewed on Epic-- he noted that the 3 wounds on the left lower extremity are granulation very slowly, and he recommended packing with wet-to-dry Kerlix. The surgeon noted excellent signal over the peroneal artery and the recommendation was to see as at the wound center to help with healing. 02/12/2016 -- we have not seen her for 2 weeks and her husband tells me that she was admitted to the hospital with a pneumonia. I have reviewed a hospital notes, she was there between January 19 and January 22, with clinical sepsis and bilateral pneumonia, fever and leukocytosis and was treated with Natasha [name], Natasha [name]. ([id number]) vancomycin and Zosyn. Vancomycin was then stopped and Zithromax was added. He was discharged home on Augmentin Objective Constitutional Pulse regular. Respirations normal and unlabored. Afebrile. Vitals Time Taken: 11:15 AM, Height: 59 in, Weight: 140 lbs, BMI: 28.3, Temperature: 98.0 F, Pulse: 89 bpm, Respiratory Rate: 16  breaths/min, Blood Pressure: 140/58 mmHg. Eyes Nonicteric. Reactive to light. Ears, Nose, Mouth, and Throat Lips, teeth, and gums WNL.Marland Kitchen Moist mucosa without lesions. Neck supple and nontender. No palpable supraclavicular or cervical adenopathy. Normal sized without goiter. Respiratory WNL. No retractions.. Cardiovascular Pedal Pulses WNL. No clubbing, cyanosis or edema. Lymphatic No adneopathy. No adenopathy. No adenopathy. Musculoskeletal Adexa without tenderness or enlargement.. Digits and nails w/o clubbing, cyanosis, infection, petechiae, ischemia, or inflammatory conditions.Marland Kitchen Psychiatric Judgement and insight Intact.. No evidence of depression, anxiety, or agitation.. General Notes: the wound on the left medial  calf area was sharply debrided with a #3 curet and minimal bleeding controlled with pressure. A left thigh wound is looking fairly clean and was not debrided Integumentary (Hair, Skin) No suspicious lesions. No crepitus or fluctuance. No peri-wound warmth or erythema. No masses.. Wound #1 status is Open. Original cause of wound was Surgical Injury. The wound is located on the [place]. ([id number]) Left,Medial Upper Leg. The wound measures 2cm length x 0.5cm width x 0.9cm depth; 0.785cm^2 area and 0.707cm^3 volume. There is Fat Layer (Subcutaneous Tissue) Exposed exposed. There is no undermining noted, however, there is tunneling at 12:00 with a maximum distance of 1cm. There is a medium amount of serosanguineous drainage noted. The wound margin is epibole. There is large (67- 100%) red, pink granulation within the wound bed. There is a small (1-33%) amount of necrotic tissue within the wound bed including Adherent Slough. The periwound skin appearance did not exhibit: Erythema. Periwound temperature was noted as No Abnormality. The periwound has tenderness on palpation. Wound #2 status is Open. Original cause of wound was Surgical Injury. The wound is located on the Left,Proximal Lower Leg. The wound measures 0.7cm length x 0.2cm width x 0.1cm depth; 0.11cm^2 area and 0.011cm^3 volume. There is Fat Layer (Subcutaneous Tissue) Exposed exposed. There is no tunneling or undermining noted. There is a none present amount of drainage noted. The wound margin is distinct with the outline attached to the wound base. There is no granulation within the wound bed. There is a large (67- 100%) amount of necrotic tissue within the wound bed including Eschar. The periwound skin appearance exhibited: Callus, Crepitus, Excoriation, Induration, Rash, Scarring, Dry/Scaly, Maceration, Atrophie Blanche, Cyanosis, Ecchymosis, Hemosiderin Staining, Mottled, Pallor, Rubor, Erythema. The surrounding wound  skin color is noted with erythema. Periwound temperature was noted as No Abnormality. Wound #3 status is Open. Original cause of wound was Surgical Injury. The wound is located on the Left,Distal Lower Leg. The wound measures 6.1cm length x 1.4cm width x 0.5cm depth; 6.707cm^2 area and 3.354cm^3 volume. There is Fat Layer (Subcutaneous Tissue) Exposed exposed. There is no undermining noted. There is a large amount of serosanguineous drainage noted. The wound margin is epibole. There is small (1-33%) red, pink granulation within the wound bed. There is a large (67-100%) amount of necrotic tissue within the wound bed including Adherent Slough. The periwound skin appearance exhibited: Scarring. The periwound skin appearance did not exhibit: Callus, Crepitus, Excoriation, Induration, Rash, Dry/Scaly, Maceration, Atrophie Blanche, Cyanosis, Ecchymosis, Hemosiderin Staining, Mottled, Pallor, Rubor, Erythema. Periwound temperature was noted as No Abnormality. The periwound has tenderness on palpation. 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  There was a Non-Viable Natasha [name], Natasha [name]. ([id number]) Tissue Open Wound/Selective [phone]) debridement with total area of 0.14 sq cm performed by Christin Fudge, MD. with the following instrument(s): Forceps to remove Viable and Non-Viable tissue/material including Eschar after achieving pain control using Other (lidocaine 4%). A time out was conducted at 11:35, prior to the start of the procedure. A Minimum amount of bleeding was controlled with Pressure. The procedure was tolerated well with a pain level of 2 throughout and a pain level of 2  following the procedure. Post Debridement Measurements: 0.7cm length x 0.2cm width x 0.1cm depth; 0.011cm^3 volume. Character of Wound/Ulcer Post Debridement requires further debridement. Severity of Tissue Post Debridement is: Fat layer exposed. Post procedure Diagnosis Wound #2: Same as Pre-Procedure Wound #3 Wound #3 is an Open Surgical Wound located on the Left,Distal Lower Leg . There was a Skin/Subcutaneous Tissue Debridement ([id number]) debridement with total area of 8.54 sq cm performed by Christin Fudge, MD. with the following instrument(s): Curette to remove Viable and Non-Viable tissue/material including Exudate and Subcutaneous after achieving pain control using Other (lidocaine 4%). A time out was conducted at 11:35, prior to the start of the procedure. A Minimum amount of bleeding was controlled with Pressure. The procedure was tolerated well with a pain level of 2 throughout and a pain level of 2 following the procedure. Post Debridement Measurements: 6.1cm length x 1.4cm width x 0.6cm depth; 4.024cm^3 volume. Character of Wound/Ulcer Post Debridement requires further debridement. Severity of Tissue Post Debridement is: Fat layer exposed. Post procedure Diagnosis Wound #3: Same as Pre-Procedure Plan Wound Cleansing: Wound #1 Left,Medial Upper Leg: Clean wound with Normal Saline. Cleanse wound with mild soap and water Wound #2 Left,Proximal Lower Leg: Clean wound with Normal Saline. Cleanse wound with mild soap and water Wound #3 Left,Distal Lower Leg: Clean wound with Normal Saline. Cleanse wound with mild soap and water Anesthetic: Wound #1 Left,Medial Upper Leg: Topical Lidocaine 4% cream applied to wound bed prior to debridement - for clinic use Wound #2 Left,Proximal Lower Leg: Topical Lidocaine 4% cream applied to wound bed prior to debridement - for clinic use Wound #3 Left,Distal Lower Leg: Topical Lidocaine 4% cream applied to wound bed prior to debridement -  for clinic use Primary Wound Dressing: Wound #1 Left,Medial Upper Leg: Natasha [name], Natasha [name] ([id number]) Aquacel Ag - pack light into wound Wound #2 Left,Proximal Lower Leg: Medihoney gel Wound #3 Left,Distal Lower Leg: Medihoney gel Secondary Dressing: Wound #1 Left,Medial Upper Leg: Boardered Foam Dressing Wound #2 Left,Proximal Lower Leg: ABD and Kerlix/Conform Other - tape Wound #3 Left,Distal Lower Leg: ABD and Kerlix/Conform Other - tape Dressing Change Frequency: Wound #1 Left,Medial Upper Leg: Change dressing every other day. Wound #2 Left,Proximal Lower Leg: Change dressing every other day. Wound #3 Left,Distal Lower Leg: Change dressing every other day. Follow-up Appointments: Wound #1 Left,Medial Upper Leg: Return Appointment in 1 week. Wound #2 Left,Proximal Lower Leg: Return Appointment in 1 week. Wound #3 Left,Distal Lower Leg: Return Appointment in 1 week. Edema Control: Wound #1 Left,Medial Upper Leg: Elevate legs to the level of the heart and pump ankles as often as possible Wound #2 Left,Proximal Lower Leg: Elevate legs to the level of the heart and pump ankles as often as possible Wound #3 Left,Distal Lower Leg: Elevate legs to the level of the heart and pump ankles as often as possible Additional Orders / Instructions: Wound #1 Left,Medial Upper Leg: Increase protein intake. Wound #2 Left,Proximal Lower Leg: Increase protein intake. Wound #3  Left,Distal Lower Leg: Increase protein intake. I have recommended: Natasha [name], Natasha [name] ([id number]) 1. packing of the thigh wound with silver alginate and an appropriate foam border. 2. the wound lower down will be covered with Medihoney and an appropriate bordered foam 3. continue to see the vascular surgeons at [facility] as requested 4. Good amount of protein intake, vitamin A, vitamin C and zinc 5. Regular visits to the wound center Electronic Signature(s) Signed: 02/16/2016 11:48:56 AM By: Christin Fudge MD,  FACS Entered By: Christin Fudge on 02/16/2016 11:48:56 Natasha [name] ([id number]) -------------------------------------------------------------------------------- SuperBill Details Patient Name: Natasha [name] Date of Service: 02/16/2016 Medical Record Number: [id number] Patient Account Number: [id number] Date of Birth/Sex: 06/13/1928 (83 y.o. 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 exposed Facility Procedures CPT4 Code Description: [id number] 11042 - DEB SUBQ TISSUE 20 SQ CM/< ICD-10 Description Diagnosis I70.242 Atherosclerosis 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 L97.222 Non-pressure chronic ulcer of left calf with fat layer e L97.122 Non-pressure chronic  ulcer of left thigh with fat layer Modifier: ration of ca xposed exposed Quantity: 1 lf CPT4 Code Description: [id number] - DEBRIDE WOUND 1ST 20 SQ CM OR < ICD-10 Description Diagnosis I70.242 Atherosclerosis 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 L97.222 Non-pressure chronic ulcer of left calf with fat layer e L97.122 Non-pressure  chronic ulcer of left thigh with fat layer Modifier: ration of ca xposed exposed Quantity: 1 lf Physician Procedures CPT4 Code Description: [id number] 11042 - WC PHYS SUBQ TISS 20 SQ CM ICD-10 Description Diagnosis I70.242 Atherosclerosis 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 L97.222 Non-pressure chronic ulcer of left calf with fat layer e L97.122 Non-pressure chronic  ulcer of left thigh with fat layer Modifier: ration of ca xposed exposed Quantity: 1 lf CPT4 Code Description: [id number]  16384 - WC PHYS DEBR WO ANESTH 20 SQ CM ICD-10 Description Diagnosis Natasha [name], Natasha [name] ([id number]) Modifier: Quantity: 1 Electronic Signature(s) Signed: 02/16/2016 11:49:13 AM By: Christin Fudge MD, FACS Entered By: Christin Fudge on 02/16/2016 11:49:13

## 2016-02-23 ENCOUNTER — Encounter: Payer: Medicare Other | Admitting: Surgery

## 2016-02-23 DIAGNOSIS — E11622 Type 2 diabetes mellitus with other skin ulcer: Secondary | ICD-10-CM | POA: Diagnosis not present

## 2016-02-23 NOTE — Progress Notes (Signed)
Natasha, [name] ([id number]) Visit Report for 02/23/2016 Chief Complaint Document Details Patient Name: Natasha [name], Natasha [name] Date of Service: 02/23/2016 12:45 PM Medical Record Number: [id number] Patient Account Number: [id number] Date of Birth/Sex: [birth year] (83 y.o. Female) Treating RN: Cornell Barman Primary Care Provider: Emily Filbert Other Clinician: Referring Provider: Emily Filbert Treating Provider/Extender: Frann Rider in Treatment: 5 Information Obtained from: Patient Chief Complaint Patient presents to the wound care center today with an open arterial ulcer along with diabetes mellitus to the left lower extremity which she's had for over a month Electronic Signature(s) Signed: 02/23/2016 1:15:27 PM By: Christin Fudge MD, FACS Entered By: Christin Fudge on 02/23/2016 13:15:27 Natasha [name] ([id number]) -------------------------------------------------------------------------------- Debridement Details Patient Name: Natasha [name] Date of Service: 02/23/2016 12:45 PM Medical Record Number: [id number] Patient Account Number: [id number] Date of Birth/Sex: 08/05/1928 (83 y.o. Female) Treating RN: Cornell Barman Primary Care Provider: Emily Filbert Other Clinician: Referring Provider: Emily Filbert Treating Provider/Extender: Frann Rider in Treatment: 5 Debridement Performed for Wound #3 Left,Distal Lower Leg Assessment: Performed By: Physician Christin Fudge, MD Debridement: Open Wound/Selective Debridement Selective Description: Pre-procedure Yes - 13:00 Verification/Time Out Taken: Start Time: 13:01 Pain Control: Other : lidocaine 4% Level: Non-Viable Tissue Total Area Debrided (L x 6 (cm) x 1 (cm) = 6 (cm) W): Tissue and other Non-Viable, Eschar, Fibrin/Slough material debrided: Instrument: Forceps Bleeding: None End Time: 13:05 Procedural Pain: 0 Post Procedural Pain: 0 Response to Treatment: Procedure was tolerated well Post  Debridement Measurements of Total Wound Length: (cm) 6 Width: (cm) 1 Depth: (cm) 0.5 Volume: (cm) 2.356 Character of Wound/Ulcer Post Requires Further Debridement Debridement: Severity of Tissue Post Debridement: Fat layer exposed Post Procedure Diagnosis Same as Pre-procedure Electronic Signature(s) Signed: 02/23/2016 1:15:17 PM By: Christin Fudge MD, FACS Signed: 02/23/2016 4:19:38 PM By: [name] RN, [name], [name] RN, [name] Entered By: Christin Fudge on 02/23/2016 13:15:17 LUCCA, GREGGS ([id number]) CATHERIN, DOORN ([id number]) -------------------------------------------------------------------------------- HPI Details Patient Name: Natasha [name] Date of Service: 02/23/2016 12:45 PM Medical Record Number: [id number] Patient Account Number: [id number] Date of Birth/Sex: 04-09-1928 (83 y.o. Female) Treating RN: Cornell Barman Primary Care Provider: Emily Filbert Other Clinician: Referring Provider: Emily Filbert Treating Provider/Extender: Frann Rider in Treatment: 5 History of Present Illness Location: left thigh and left calf in the area where there was previous vascular surgery Quality: Patient reports experiencing a dull pain to affected area(s). Severity: Patient states wound (s) are getting better. Duration: Patient has had the wound for > 2 months prior to seeking treatment at the wound center Timing: Pain in wound is Intermittent (comes and goes Context: The wound occurred when the patient had vascular surgery and the wounds were not healing very well Modifying Factors: Other treatment(s) tried include:local care and oral antibiotics Associated Signs and Symptoms: Patient reports having increase swelling. HPI Description: 83 year old female with significant past medical history of anemia, cellulitis and abscess of the leg, COPD, coronary artery disease, hypertension, osteoarthritis, peripheral vascular disease with claudication, trigeminal neuralgia and type  2 diabetes mellitus without complications. A past surgical history significant for appendectomy, left femoral o peroneal bypass graft in October 2017, and debridement of skin and subcutis tissue on the left side in December 2017. She was treated by Dr. Lucky Cowboy earlier, with multiple left leg revascularization both endovascular and open. She is a former smoker who quit 30 years ago. When she was last seen at [facility], at the vascular surgery clinic she had a left lower extremity 3 separate wounds along  the saphenectomy site. She was placed on Bactrim DS for 10 days. She had a good signal over the peroneal artery and the bypass remains patent. Prior to surgery her left ABI was 0.46 and a right was 0.98. Note the patient was seen by Dr. Amalia Hailey of podiatry on 01/15/2016 and he saw for ulceration of her left great toe. Most recently the patient was seen by Dr. Maura Crandall, the vascular surgeon on January 5 and these notes are reviewed on Epic-- he noted that the 3 wounds on the left lower extremity are granulation very slowly, and he recommended packing with wet-to-dry Kerlix. The surgeon noted excellent signal over the peroneal artery and the recommendation was to see as at the wound center to help with healing. 02/12/2016 -- we have not seen her for 2 weeks and her husband tells me that she was admitted to the hospital with a pneumonia. I have reviewed a hospital notes, she was there between January 19 and January 22, with clinical sepsis and bilateral pneumonia, fever and leukocytosis and was treated with vancomycin and Zosyn. Vancomycin was then stopped and Zithromax was added. He was discharged home on Augmentin 02/23/2016 -- she had a arterial duplex study done recently at [facility] and her right ABI was 0.92 and the left ABI was 0.96. The femoral to peroneal bypass is widely patent. she was also seen by the surgeon who noted a postoperative seroma in the left groin. she was asked to return in 3  months for duplex examination ATHINA, FAHEY ([id number]) Electronic Signature(s) Signed: 02/23/2016 1:15:34 PM By: Christin Fudge MD, FACS Previous Signature: 02/23/2016 1:14:50 PM Version By: Christin Fudge MD, FACS Entered By: Christin Fudge on 02/23/2016 13:15:34 Natasha [name] ([id number]) -------------------------------------------------------------------------------- Physical Exam Details Patient Name: Natasha [name] Date of Service: 02/23/2016 12:45 PM Medical Record Number: [id number] Patient Account Number: [id number] Date of Birth/Sex: November 06, 1928 (83 y.o. Female) Treating RN: Cornell Barman Primary Care Provider: Emily Filbert Other Clinician: Referring Provider: Emily Filbert Treating Provider/Extender: Frann Rider in Treatment: 5 Constitutional . Pulse regular. Respirations normal and unlabored. Afebrile. . Eyes Nonicteric. Reactive to light. Ears, Nose, Mouth, and Throat Lips, teeth, and gums WNL.Marland Kitchen Moist mucosa without lesions. Neck supple and nontender. No palpable supraclavicular or cervical adenopathy. Normal sized without goiter. Respiratory WNL. No retractions.. Breath sounds WNL, No rubs, rales, rhonchi, or wheeze.. Cardiovascular Heart rhythm and rate regular, no murmur or gallop.. Pedal Pulses WNL. No clubbing, cyanosis or edema. Chest Breasts symmetical and no nipple discharge.. Breast tissue WNL, no masses, lumps, or tenderness.. Gastrointestinal (GI) Abdomen without masses or tenderness.. No liver or spleen enlargement or tenderness.. Genitourinary (GU) No hydrocele, spermatocele, tenderness of the cord, or testicular mass.Marland Kitchen Penis without lesions.Lowella Fairy without lesions. No cystocele, or rectocele. Pelvic support intact, no discharge.Marland Kitchen Urethra without masses, tenderness or scarring.Marland Kitchen Lymphatic No adneopathy. No adenopathy. No adenopathy. Musculoskeletal Adexa without tenderness or enlargement.. Digits and nails w/o clubbing,  cyanosis, infection, petechiae, ischemia, or inflammatory conditions.. Integumentary (Hair, Skin) No suspicious lesions. No crepitus or fluctuance. No peri-wound warmth or erythema. No masses.Marland Kitchen Psychiatric Judgement and insight Intact.. No evidence of depression, anxiety, or agitation.. Notes the wound on the left thigh has healthy granulation tissue superiorly and minimal debris inferiorly. The left lower extremity wound was debrided of some of the subcutaneous and his debris with a toothed forcep and there was no bleeding. CONSTANZA, MINCY ([id number]) Electronic Signature(s) Signed: 02/23/2016 1:16:12 PM By: Christin Fudge MD, FACS Entered By: Con Memos  Lamonte Hartt on 02/23/2016 13:16:11 GWYNN, CHALKER ([id number]) -------------------------------------------------------------------------------- Physician Orders Details Patient Name: Natasha [name] Date of Service: 02/23/2016 12:45 PM Medical Record Number: [id number] Patient Account Number: [id number] Date of Birth/Sex: 1928/06/11 (83 y.o. Female) Treating RN: Cornell Barman Primary Care Provider: Emily Filbert Other Clinician: Referring Provider: Emily Filbert Treating Provider/Extender: Frann Rider in Treatment: 5 Verbal / Phone Orders: No Diagnosis Coding Wound Cleansing Wound #1 Left,Medial Upper Leg o Clean wound with Normal Saline. o Cleanse wound with mild soap and water Wound #3 Left,Distal Lower Leg o Clean wound with Normal Saline. o Cleanse wound with mild soap and water Anesthetic Wound #1 Left,Medial Upper Leg o Topical Lidocaine 4% cream applied to wound bed prior to debridement - for clinic use Wound #3 Left,Distal Lower Leg o Topical Lidocaine 4% cream applied to wound bed prior to debridement - for clinic use Skin Barriers/Peri-Wound Care Wound #1 Left,Medial Upper Leg o Skin Prep Wound #3 Left,Distal Lower Leg o Skin Prep Primary Wound Dressing Wound #1 Left,Medial Upper  Leg o Iodoform packing Gauze Wound #3 Left,Distal Lower Leg o Medihoney gel Secondary Dressing Wound #1 Left,Medial Upper Leg o Boardered Foam Dressing Wound #3 Left,Distal Lower Leg o Boardered Foam Dressing XITLALLY, MOONEYHAM ([id number]) Dressing Change Frequency Wound #1 Left,Medial Upper Leg o Change dressing every other day. Wound #3 Left,Distal Lower Leg o Change dressing every other day. Follow-up Appointments Wound #1 Left,Medial Upper Leg o Return Appointment in 1 week. Wound #3 Left,Distal Lower Leg o Return Appointment in 1 week. Edema Control Wound #1 Left,Medial Upper Leg o Elevate legs to the level of the heart and pump ankles as often as possible Wound #3 Left,Distal Lower Leg o Elevate legs to the level of the heart and pump ankles as often as possible Additional Orders / Instructions Wound #1 Left,Medial Upper Leg o Increase protein intake. Wound #3 Left,Distal Lower Leg o Increase protein intake. Home Health Wound #1 Left,Medial Upper Leg o Continue Home Health Visits o Home Health Nurse may visit PRN to address patientos wound care needs. o FACE TO FACE ENCOUNTER: MEDICARE and MEDICAID PATIENTS: I certify that this patient is under my care and that I had a face-to-face encounter that meets the physician face-to-face encounter requirements with this patient on this date. The encounter with the patient was in whole or in part for the following MEDICAL CONDITION: (primary reason for [place]) MEDICAL NECESSITY: I certify, that based on my findings, NURSING services are a medically necessary home health service. HOME BOUND STATUS: I certify that my clinical findings support that this patient is homebound (i.e., Due to illness or injury, pt requires aid of supportive devices such as crutches, cane, wheelchairs, walkers, the use of special transportation or the assistance of another person to leave their place of  residence. There is a normal inability to leave the home and doing so requires considerable and taxing effort. Other absences are for medical reasons / religious services and are infrequent or of short duration when for other reasons). ALORA, GOREY ([id number]) o If current dressing causes regression in wound condition, may D/C ordered dressing product/s and apply Normal Saline Moist Dressing daily until next [place] / Other MD appointment. [place] of regression in wound condition at [phone]. o Please direct any NON-WOUND related issues/requests for orders to patient's Primary Care Physician Wound #3 Left,Distal Lower Leg o [place] Visits o Home Health Nurse may visit PRN to address patientos wound  care needs. o FACE TO FACE ENCOUNTER: MEDICARE and MEDICAID PATIENTS: I certify that this patient is under my care and that I had a face-to-face encounter that meets the physician face-to-face encounter requirements with this patient on this date. The encounter with the patient was in whole or in part for the following MEDICAL CONDITION: (primary reason for [place]) MEDICAL NECESSITY: I certify, that based on my findings, NURSING services are a medically necessary home health service. HOME BOUND STATUS: I certify that my clinical findings support that this patient is homebound (i.e., Due to illness or injury, pt requires aid of supportive devices such as crutches, cane, wheelchairs, walkers, the use of special transportation or the assistance of another person to leave their place of residence. There is a normal inability to leave the home and doing so requires considerable and taxing effort. Other absences are for medical reasons / religious services and are infrequent or of short duration when for other reasons). o If current dressing causes regression in wound condition, may D/C ordered dressing product/s and apply  Normal Saline Moist Dressing daily until next [place] / Other MD appointment. [place] of regression in wound condition at [phone]. o Please direct any NON-WOUND related issues/requests for orders to patient's Primary Care Physician Electronic Signature(s) Signed: 02/23/2016 3:56:09 PM By: Christin Fudge MD, FACS Signed: 02/23/2016 4:19:38 PM By: [name] RN, [name], [name] RN, [name] Entered By: [name], RN, [name], [name] on 02/23/2016 13:20:13 MARIADEJESUS, CADE ([id number]) -------------------------------------------------------------------------------- Problem List Details Patient Name: Natasha [name] Date of Service: 02/23/2016 12:45 PM Medical Record Number: [id number] Patient Account Number: [id number] Date of Birth/Sex: 1928-02-14 (83 y.o. 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 Female) Treating RN: Cornell Barman Primary Care Provider: Emily Filbert Other Clinician: Referring Provider: Emily Filbert Treating Provider/Extender: Frann Rider in Treatment: 5 Subjective Chief Complaint Information obtained from Patient Patient presents to the wound care center today with an open arterial ulcer along with diabetes mellitus to the left lower extremity which she's had for over a month History of Present Illness (HPI) The following HPI elements were documented for the patient's wound: Location: left thigh and left calf in the area where there was previous vascular surgery Quality: Patient reports experiencing a dull pain to affected area(s). Severity: Patient states wound (s) are getting better. Duration: Patient has had the wound for > 2 months prior to seeking treatment at the wound center Timing: Pain in wound is Intermittent (comes and goes Context: The wound occurred when the patient had vascular surgery and the wounds were not healing very well Modifying Factors: Other treatment(s) tried include:local care and oral antibiotics Associated Signs and Symptoms: Patient reports having increase swelling. 83 year old female with significant past medical history of anemia, cellulitis and abscess of  the leg, COPD, coronary artery disease, hypertension, osteoarthritis, peripheral vascular disease with claudication, trigeminal neuralgia and type 2 diabetes mellitus without complications. A past surgical history significant for appendectomy, left femoral peroneal bypass graft in October 2017, and debridement of skin and subcutis tissue on the left side in December 2017. She was treated by Dr. Lucky Cowboy earlier, with multiple left leg revascularization both endovascular and open. She is a former smoker who quit 30 years ago. When she was last seen at [facility], at the vascular surgery clinic she had a left lower extremity 3 separate wounds along the saphenectomy site. She was placed on Bactrim DS for 10 days. She had a good signal over the peroneal artery and the bypass remains patent. Prior to  surgery her left ABI was 0.46 and a right was 0.98. Note the patient was seen by Dr. Amalia Hailey of podiatry on 01/15/2016 and he saw for ulceration of her left great toe. Most recently the patient was seen by Dr. Maura Crandall, the vascular surgeon on January 5 and these notes are reviewed on Epic-- he noted that the 3 wounds on the left lower extremity are granulation very slowly, and he recommended packing with wet-to-dry Kerlix. The surgeon noted excellent signal over the peroneal artery and the recommendation was to see as at the wound center to help with healing. 02/12/2016 -- we have not seen her for 2 weeks and her husband tells me that she was admitted to the hospital with a pneumonia. I have reviewed a hospital notes, she was there between January 19 and January 22, with clinical sepsis and bilateral pneumonia, fever and leukocytosis and was treated with ARLENY, KRUGER. ([id number]) vancomycin and Zosyn. Vancomycin was then stopped and Zithromax was added. He was discharged home on Augmentin 02/23/2016 -- she had a arterial duplex study done recently at [facility] and her right ABI was 0.92 and the left ABI was 0.96. The femoral to peroneal bypass is widely patent. she was also seen by the surgeon who noted a postoperative seroma in the left groin. she was asked to return in 3 months for duplex examination Objective Constitutional Pulse regular. Respirations normal and unlabored. Afebrile. Vitals Time Taken: 12:43 PM, Height: 59 in, Weight: 140 lbs, BMI: 28.3, Temperature: 98.2 F, Pulse: 87 bpm, Respiratory Rate: 16 breaths/min, Blood Pressure: 129/53 mmHg. Eyes Nonicteric. Reactive to light. Ears, Nose, Mouth, and Throat Lips, teeth, and gums WNL.Marland Kitchen Moist mucosa without lesions. Neck supple and nontender. No palpable supraclavicular or cervical adenopathy. Normal sized without goiter. Respiratory WNL. No retractions.. Breath sounds WNL, No rubs, rales, rhonchi, or  wheeze.. Cardiovascular Heart rhythm and rate regular, no murmur or gallop.. Pedal Pulses WNL. No clubbing, cyanosis or edema. Chest Breasts symmetical and no nipple discharge.. Breast tissue WNL, no masses, lumps, or tenderness.. Gastrointestinal (GI) Abdomen without masses or tenderness.. No liver or spleen enlargement or tenderness.. Genitourinary (GU) No hydrocele, spermatocele, tenderness of the cord, or testicular mass.Marland Kitchen Penis without lesions.Lowella Fairy without lesions. No cystocele, or rectocele. Pelvic support intact, no discharge.Marland Kitchen Urethra without masses, tenderness or scarring.Marland Kitchen Lymphatic No adneopathy. No adenopathy. No adenopathy. KADEJA, GRANADA ([id number]) Musculoskeletal Adexa without tenderness or enlargement.. Digits and nails w/o clubbing, cyanosis, infection, petechiae, ischemia, or inflammatory conditions.Marland Kitchen Psychiatric Judgement and insight Intact.. No evidence of depression, anxiety, or agitation.. General Notes: the wound on the left thigh has healthy granulation tissue superiorly and minimal debris inferiorly. The left lower extremity wound was debrided of some of the subcutaneous and his debris  with a toothed forcep and there was no bleeding. Integumentary (Hair, Skin) No suspicious lesions. No crepitus or fluctuance. No peri-wound warmth or erythema. No masses.. Wound #1 status is Open. Original cause of wound was Surgical Injury. The wound is located on the Left,Medial Upper Leg. The wound measures 2cm length x 0.4cm width x 0.9cm depth; 0.628cm^2 area and 0.565cm^3 volume. There is Fat Layer (Subcutaneous Tissue) Exposed exposed. There is no tunneling or undermining noted. There is a medium amount of serosanguineous drainage noted. The wound margin is epibole. There is large (67-100%) red, pink granulation within the wound bed. There is a small (1-33%) amount of necrotic tissue within the wound bed including Adherent Slough. The periwound skin  appearance exhibited: Scarring. The periwound skin appearance did not exhibit: Erythema. Periwound temperature was noted as No Abnormality. The periwound has tenderness on palpation. Wound #2 status is Healed - Epithelialized. Original cause of wound was Surgical Injury. The wound is located on the Left,Proximal Lower Leg. The wound measures 0cm length x 0cm width x 0cm depth; 0cm^2 area and 0cm^3 volume. The wound is limited to skin breakdown. There is no tunneling or undermining noted. There is a none present amount of drainage noted. The wound margin is distinct with the outline attached to the wound base. There is no granulation within the wound bed. There is no necrotic tissue within the wound bed. The periwound skin appearance exhibited: Scarring. The periwound skin appearance did not exhibit: Callus, Crepitus, Excoriation, Induration, Rash, Dry/Scaly, Maceration, Atrophie Blanche, Cyanosis, Ecchymosis, Hemosiderin Staining, Mottled, Pallor, Rubor, Erythema. Periwound temperature was noted as No Abnormality. Wound #3 status is Open. Original cause of wound was Surgical Injury. The wound is located on the Left,Distal Lower Leg. The wound measures 6cm length x 1cm width x 0.5cm depth; 4.712cm^2 area and 2.356cm^3 volume. There is Fat Layer (Subcutaneous Tissue) Exposed exposed. There is no tunneling or undermining noted. There is a large amount of serosanguineous drainage noted. The wound margin is epibole. There is small (1-33%) red, pink granulation within the wound bed. There is a large (67-100%) amount of necrotic tissue within the wound bed including Adherent Slough. The periwound skin appearance exhibited: Scarring. The periwound skin appearance did not exhibit: Callus, Crepitus, Excoriation, Induration, Rash, Dry/Scaly, Maceration, Atrophie Blanche, Cyanosis, Ecchymosis, Hemosiderin Staining, Mottled, Pallor, Rubor, Erythema. Periwound temperature was noted as No Abnormality. The  periwound has tenderness on palpation. PRISCILLIA, FOUCH ([id number]) Assessment Active Problems ICD-10 [phone] - Atherosclerosis 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ration of calf L97.222 - Non-pressure chronic ulcer of left calf with fat layer exposed L97.122 - 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 exposed Procedures Wound #3 Wound #3 is an Open Surgical Wound located on the Left,Distal Lower Leg . There was a Non-Viable Tissue Open Wound/Selective [phone]) debridement with total area of 6 sq cm performed by Christin Fudge, MD. with the following instrument(s): Forceps to remove Non-Viable tissue/material including Fibrin/Slough and Eschar after achieving pain control using Other (lidocaine 4%). A time out was conducted at 13:00, prior to the start of the procedure. There was no bleeding. The procedure was tolerated well with a pain level of 0 throughout and a pain level of 0 following the procedure. Post Debridement Measurements: 6cm length x 1cm width x 0.5cm depth; 2.356cm^3 volume. Character of Wound/Ulcer Post Debridement requires further debridement. Severity of Tissue Post Debridement is: Fat layer exposed. Post procedure Diagnosis Wound #3: Same as Pre-Procedure Plan Wound Cleansing: Wound #1 Left,Medial  Upper Leg: Clean wound with Normal Saline. Cleanse wound with mild soap and water Wound #3 Left,Distal Lower Leg: Clean wound with Normal Saline. Cleanse wound with mild soap and water Anesthetic: Wound #1 Left,Medial Upper Leg: Topical Lidocaine 4% cream applied to wound bed prior to debridement - for clinic use Wound #3 Left,Distal Lower Leg: Topical Lidocaine 4% cream applied to wound bed prior to debridement - for clinic use Skin Barriers/Peri-Wound Care: Wound #1 Left,Medial Upper Leg: CECIA, EGGE ([id number]) Skin Prep Wound #3 Left,Distal Lower Leg: Skin Prep Primary Wound Dressing: Wound #1 Left,Medial Upper  Leg: Iodoform packing Gauze Wound #3 Left,Distal Lower Leg: Medihoney gel Secondary Dressing: Wound #1 Left,Medial Upper Leg: Boardered Foam Dressing Wound #3 Left,Distal Lower Leg: Boardered Foam Dressing Dressing Change Frequency: Wound #1 Left,Medial Upper Leg: Change dressing every other day. Wound #3 Left,Distal Lower Leg: Change dressing every other day. Follow-up Appointments: Wound #1 Left,Medial Upper Leg: Return Appointment in 1 week. Wound #3 Left,Distal Lower Leg: Return Appointment in 1 week. Edema Control: Wound #1 Left,Medial Upper Leg: Elevate legs to the level of the heart and pump ankles as often as possible Wound #3 Left,Distal Lower Leg: Elevate legs to the level of the heart and pump ankles as often as possible Additional Orders / Instructions: Wound #1 Left,Medial Upper Leg: Increase protein intake. Wound #3 Left,Distal Lower Leg: Increase protein intake. Home Health: Wound #1 Left,Medial Upper Leg: [place] Nurse may visit PRN to address patient s wound care needs. FACE TO FACE ENCOUNTER: MEDICARE and MEDICAID PATIENTS: I certify that this patient is under my care and that I had a face-to-face encounter that meets the physician face-to-face encounter requirements with this patient on this date. The encounter with the patient was in whole or in part for the following MEDICAL CONDITION: (primary reason for [place]) MEDICAL NECESSITY: I certify, that based on my findings, NURSING services are a medically necessary home health service. HOME BOUND STATUS: I certify that my clinical findings support that this patient is homebound (i.e., Due to illness or injury, pt requires aid of supportive devices such as crutches, cane, wheelchairs, walkers, the use of special transportation or the assistance of another person to leave their place of residence. There is a normal inability to leave the home and doing so requires  considerable and taxing effort. Other absences are for medical reasons / religious services and are infrequent or of short duration when for other reasons). If current dressing causes regression in wound condition, may D/C ordered dressing product/s and apply Normal Saline Moist Dressing daily until next [place] / Other MD appointment. [place] of regression in wound condition at [phone]. Please direct any NON-WOUND related issues/requests for orders to patient's Primary Care Physician TAYLYN, BRAME ([id number]) Wound #3 Left,Distal Lower Leg: [place] Nurse may visit PRN to address patient s wound care needs. FACE TO FACE ENCOUNTER: MEDICARE and MEDICAID PATIENTS: I certify that this patient is under my care and that I had a face-to-face encounter that meets the physician face-to-face encounter requirements with this patient on this date. The encounter with the patient was in whole or in part for the following MEDICAL CONDITION: (primary reason for [place]) MEDICAL NECESSITY: I certify, that based on my findings, NURSING services are a medically necessary home health service. HOME BOUND STATUS: I certify that my clinical findings support that this patient is homebound (i.e., Due to illness or  injury, pt requires aid of supportive devices such as crutches, cane, wheelchairs, walkers, the use of special transportation or the assistance of another person to leave their place of residence. There is a normal inability to leave the home and doing so requires considerable and taxing effort. Other absences are for medical reasons / religious services and are infrequent or of short duration when for other reasons). If current dressing causes regression in wound condition, may D/C ordered dressing product/s and apply Normal Saline Moist Dressing daily until next [place] / Other MD appointment. [place] of regression in wound condition at [phone]. Please direct any NON-WOUND related issues/requests for orders to patient's Primary Care Physician I have recommended: 1. packing of the thigh wound with 1/4 inch iodoform gauze and an appropriate foam border. 2. the wound lower down will be covered with Medihoney and an appropriate bordered foam 3. continue to see the vascular surgeons at [facility] as requested -- recent investigation and report reviewed 4. Good amount of protein intake, vitamin A, vitamin C and zinc 5. Regular visits to the wound center Electronic Signature(s) Signed: 02/23/2016 3:56:55 PM By: Christin Fudge MD, FACS Previous Signature: 02/23/2016 1:17:04 PM Version By: Christin Fudge MD, FACS Entered By: Christin Fudge on 02/23/2016 15:56:54 Natasha [name] ([id number]) -------------------------------------------------------------------------------- SuperBill Details Patient Name: Natasha [name] Date of Service: 02/23/2016 Medical Record Number: [id number] Patient Account Number: [id number] Date of Birth/Sex: 09/22/1928 (82 y.o. 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 exposed Facility Procedures CPT4 Code Description: [id number] - DEBRIDE WOUND 1ST 20 SQ CM OR < ICD-10 Description Diagnosis I70.242 Atherosclerosis 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 L97.222 Non-pressure chronic ulcer of left calf with fat layer e L97.122 Non-pressure  chronic ulcer of left thigh with fat layer Modifier: ration of ca xposed  exposed Quantity: 1 lf Physician Procedures CPT4 Code Description: [id number] - WC PHYS LEVEL 3 - EST PT ICD-10 Description Diagnosis I70.242 Atherosclerosis 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 L97.222 Non-pressure chronic ulcer of left calf with fat layer e L97.122 Non-pressure chronic  ulcer of left thigh with fat layer Modifier: 25 ration of ca xposed exposed Quantity: 1 lf CPT4 Code Description: [id number] - WC PHYS DEBR WO ANESTH 20 SQ CM ICD-10 Description Diagnosis I70.242 Atherosclerosis 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

## 2016-02-23 NOTE — Progress Notes (Signed)
Natasha [name] ([id number]) Visit Report for 02/23/2016 Arrival Information Details Patient Name: Natasha [name] Date of Service: 02/23/2016 12:45 PM Medical Record Number: [id number] Patient Account Number: [id number] Date of Birth/Sex: May 03, 1928 (83 y.o. Female) Treating RN: Cornell Barman Primary Care Mamta Rimmer: Emily Filbert Other Clinician: Referring Zoiee Wimmer: Emily Filbert Treating Alinna Siple/Extender: Frann Rider in Treatment: 5 Visit Information History Since Last Visit Added or deleted any medications: No Patient Arrived: Natasha [name] Any new allergies or adverse reactions: No Arrival Time: 12:42 Had a fall or experienced change in No Accompanied By: self activities of daily living that may affect Transfer Assistance: None risk of falls: Patient Identification Verified: Yes Signs or symptoms of abuse/neglect since last No Secondary Verification Process Yes visito Completed: Hospitalized since last visit: No Patient Requires Transmission- No Has Dressing in Place as Prescribed: Yes Based Precautions: Pain Present Now: No Patient Has Alerts: Yes Patient Alerts: Patient on Blood Thinner Plavix Electronic Signature(s) Signed: 02/23/2016 4:19:38 PM By: [name], RN, [name], [name] RN, [name] Entered By: [name], RN, [name], [name] on 02/23/2016 12:42:48 Natasha [name] ([id number]) -------------------------------------------------------------------------------- Encounter Discharge Information Details Patient Name: Natasha [name] Date of Service: 02/23/2016 12:45 PM Medical Record Number: [id number] Patient Account Number: [id number] Date of Birth/Sex: [birth year] (83 y.o. Female) Treating RN: Cornell Barman Primary Care Esten Dollar: Emily Filbert Other Clinician: Referring Wise Fees: Emily Filbert Treating Wagner Tanzi/Extender: Frann Rider in Treatment: 5 Encounter Discharge Information Items Discharge Pain Level: 0 Discharge Condition: Stable Ambulatory Status:  Walker Discharge Destination: Home Transportation: Private Auto Accompanied By: husband Schedule Follow-up Appointment: Yes Medication Reconciliation completed Yes and provided to Patient/Care Terren Jandreau: Provided on Clinical Summary of Care: 02/23/2016 Form Type Recipient Paper Patient [facility] Electronic Signature(s) Signed: 02/23/2016 4:19:38 PM By: [name] RN, [name], [name] RN, [name] Previous Signature: 02/23/2016 1:20:03 PM Version By: Ruthine Dose Entered By: [name] RN, [name], [name] on 02/23/2016 13:22:14 Natasha [name] ([id number]) -------------------------------------------------------------------------------- Lower Extremity Assessment Details Patient Name: Natasha [name] Date of Service: 02/23/2016 12:45 PM Medical Record Number: [id number] Patient Account Number: [id number] Date of Birth/Sex: September 12, 1928 (83 y.o. Female) Treating RN: Cornell Barman Primary Care Darryon Bastin: Emily Filbert Other Clinician: Referring Danilynn Jemison: Emily Filbert Treating Clemence Stillings/Extender: Frann Rider in Treatment: 5 Vascular Assessment Claudication: Claudication Assessment [Left:None] Pulses: Dorsalis Pedis Palpable: [Left:Yes] Posterior Tibial Palpable: [Left:Yes] Extremity colors, hair growth, and conditions: Extremity Color: [Left:Normal] Hair Growth on Extremity: [Left:No] Temperature of Extremity: [Left:Warm] Capillary Refill: [Left:< 3 seconds] Dependent Rubor: [Left:No] Blanched when Elevated: [Left:No] Lipodermatosclerosis: [Left:No] Toe Nail Assessment Left: Right: Thick: Yes Discolored: Yes Deformed: Yes Improper Length and Hygiene: Yes Electronic Signature(s) Signed: 02/23/2016 4:19:38 PM By: [name], RN, [name], [name] RN, [name] Entered By: [name], RN, [name], [name] on 02/23/2016 12:57:52 Natasha [name] ([id number]) -------------------------------------------------------------------------------- Multi Wound Chart Details Patient Name: Natasha [name] Date of Service: 02/23/2016  12:45 PM Medical Record Number: [id number] Patient Account Number: [id number] Date of Birth/Sex: Dec 09, 1928 (83 y.o. Female) Treating RN: Cornell Barman Primary Care Sachiko Methot: Emily Filbert Other Clinician: Referring Princetta Uplinger: Emily Filbert Treating Latavion Halls/Extender: Frann Rider in Treatment: 5 Vital Signs Height(in): 59 Pulse(bpm): 87 Weight(lbs): 140 Blood Pressure 129/53 (mmHg): Body Mass Index(BMI): 28 Temperature(F): 98.2 Respiratory Rate 16 (breaths/min): Photos: Wound Location: Left Upper Leg - Medial Left Lower Leg - Proximal Left Lower Leg - Distal Wounding Event: Surgical Injury Surgical Injury Surgical Injury Primary Etiology: Open Surgical Wound Open Surgical Wound Open Surgical Wound Comorbid History: Anemia, Asthma, Anemia, Asthma, Anemia, Asthma, Hypertension, Peripheral Hypertension, Peripheral Hypertension, Peripheral Venous Disease, Venous Disease,  Venous Disease, Osteoarthritis, Neuropathy Osteoarthritis, Neuropathy Osteoarthritis, Neuropathy Date Acquired: 12/04/2015 12/04/2015 12/04/2015 Weeks of Treatment: '5 5 5 ' Wound Status: Open Open Open Measurements L x W x D 2x0.4x0.9 0.1x0.1x0.1 6x1x0.5 (cm) Area (cm) : 0.628 0.008 4.712 Volume (cm) : 0.565 0.001 2.356 % Reduction in Area: 33.30% 98.60% 73.30% % Reduction in Volume: 57.20% 99.80% 81.00% Classification: Full Thickness With Full Thickness With Full Thickness With Exposed Support Exposed Support Exposed Support Structures Structures Structures Exudate Amount: Medium None Present Large Exudate Type: Serosanguineous N/A Serosanguineous Exudate Color: red, brown N/A red, brown Wound Margin: Epibole Distinct, outline attached Epibole Granulation Amount: Large (67-100%) None Present (0%) Small (1-33%) Natasha [name], Natasha [name]. ([id number]) Granulation Quality: Red, Pink N/A Red, Pink, Hyper- granulation Necrotic Amount: Small (1-33%) None Present (0%) Large (67-100%) Exposed Structures: Fat  Layer (Subcutaneous Fascia: No Fat Layer (Subcutaneous Tissue) Exposed: Yes Fat Layer (Subcutaneous Tissue) Exposed: Yes Fascia: No Tissue) Exposed: No Fascia: No Tendon: No Tendon: No Tendon: No Muscle: No Muscle: No Muscle: No Joint: No Joint: No Joint: No Bone: No Bone: No Bone: No Limited to Skin Breakdown Epithelialization: Small (1-33%) Large (67-100%) None Periwound Skin Texture: Scarring: Yes Scarring: Yes Scarring: Yes Excoriation: No Excoriation: No Induration: No Induration: No Callus: No Callus: No Crepitus: No Crepitus: No Rash: No Rash: No Periwound Skin No Abnormalities Noted Maceration: No Maceration: No Moisture: Dry/Scaly: No Dry/Scaly: No Periwound Skin Color: Erythema: No Atrophie Blanche: No Atrophie Blanche: No Cyanosis: No Cyanosis: No Ecchymosis: No Ecchymosis: No Erythema: No Erythema: No Hemosiderin Staining: No Hemosiderin Staining: No Mottled: No Mottled: No Pallor: No Pallor: No Rubor: No Rubor: No Temperature: No Abnormality No Abnormality No Abnormality Tenderness on Yes No Yes Palpation: Wound Preparation: Ulcer Cleansing: Ulcer Cleansing: Ulcer Cleansing: Rinsed/Irrigated with Rinsed/Irrigated with Rinsed/Irrigated with Saline Saline Saline Topical Anesthetic Topical Anesthetic Topical Anesthetic Applied: Other: lidocaine Applied: None Applied: Other: lidocaine 4% 4% Treatment Notes Electronic Signature(s) Signed: 02/23/2016 1:10:11 PM By: Christin Fudge MD, FACS Entered By: Christin Fudge on 02/23/2016 13:10:11 Natasha [name] ([id number]) -------------------------------------------------------------------------------- [place] Details Patient Name: Natasha [name] Date of Service: 02/23/2016 12:45 PM Medical Record Number: [id number] Patient Account Number: [id number] Date of Birth/Sex: January 09, 1929 (83 y.o. Female) Treating RN: Cornell Barman Primary Care Kayler Buckholtz: Emily Filbert Other  Clinician: Referring Vicki Pasqual: Emily Filbert Treating Jazmene Racz/Extender: Frann Rider in Treatment: 5 Active Inactive ` Abuse / Safety / Falls / Self Care Management Nursing Diagnoses: Potential for falls Goals: Patient will remain injury free Date Initiated: 01/19/2016 Target Resolution Date: 05/25/2016 Goal Status: Active Interventions: Assess fall risk on admission and as needed Assess: immobility, friction, shearing, incontinence upon admission and as needed Assess impairment of mobility on admission and as needed per policy Notes: ` Nutrition Nursing Diagnoses: Imbalanced nutrition Goals: Patient/caregiver agrees to and verbalizes understanding of need to use nutritional supplements and/or vitamins as prescribed Date Initiated: 01/19/2016 Target Resolution Date: 05/25/2016 Goal Status: Active Interventions: Assess patient nutrition upon admission and as needed per policy Notes: ` Orientation to the [place]. ([id number]) Nursing Diagnoses: Knowledge deficit related to the wound healing center program Goals: Patient/caregiver will verbalize understanding of the [facility] Date Initiated: 01/19/2016 Target Resolution Date: 05/25/2016 Goal Status: Active Interventions: Provide education on orientation to the wound center Notes: ` Pain, Acute or Chronic Nursing Diagnoses: Pain, acute or chronic: actual or potential Potential alteration in comfort, pain Goals: Patient will verbalize adequate pain control and receive pain control interventions during procedures  as needed Date Initiated: 01/19/2016 Target Resolution Date: 05/25/2016 Goal Status: Active Patient/caregiver will verbalize adequate pain control between visits Date Initiated: 01/19/2016 Target Resolution Date: 05/25/2016 Goal Status: Active Patient/caregiver will verbalize comfort level met Date Initiated: 01/19/2016 Target Resolution Date: 05/25/2016 Goal  Status: Active Interventions: Assess comfort goal upon admission Complete pain assessment as per visit requirements Notes: ` Wound/Skin Impairment Nursing Diagnoses: Impaired tissue integrity Knowledge deficit related to ulceration/compromised skin integrity Goals: Natasha [name], Natasha [name] ([id number]) Ulcer/skin breakdown will have a volume reduction of 30% by week 4 Date Initiated: 01/19/2016 Target Resolution Date: 05/25/2016 Goal Status: Active Ulcer/skin breakdown will have a volume reduction of 50% by week 8 Date Initiated: 01/19/2016 Target Resolution Date: 05/25/2016 Goal Status: Active Ulcer/skin breakdown will have a volume reduction of 80% by week 12 Date Initiated: 01/19/2016 Target Resolution Date: 05/25/2016 Goal Status: Active Interventions: Assess patient/caregiver ability to perform ulcer/skin care regimen upon admission and as needed Assess ulceration(s) every visit Notes: Electronic Signature(s) Signed: 02/23/2016 4:19:38 PM By: [name], RN, [name], [name] RN, [name] Entered By: [name], RN, [name], [name] on 02/23/2016 12:59:37 Natasha [name] ([id number]) -------------------------------------------------------------------------------- Pain Assessment Details Patient Name: Natasha [name] Date of Service: 02/23/2016 12:45 PM Medical Record Number: [id number] Patient Account Number: [id number] Date of Birth/Sex: April 05, 1928 (83 y.o. Female) Treating RN: Cornell Barman Primary Care Verneda Hollopeter: Emily Filbert Other Clinician: Referring Farris Geiman: Emily Filbert Treating Robbyn Hodkinson/Extender: Frann Rider in Treatment: 5 Active Problems Location of Pain Severity and Description of Pain Patient Has Paino No Site Locations With Dressing Change: No Pain Management and Medication Current Pain Management: Electronic Signature(s) Signed: 02/23/2016 4:19:38 PM By: [name], RN, [name], [name] RN, [name] Entered By: [name], RN, [name], [name] on 02/23/2016 12:43:12 Natasha [name]  ([id number]) -------------------------------------------------------------------------------- Patient/Caregiver Education Details Patient Name: Natasha [name] Date of Service: 02/23/2016 12:45 PM Medical Record Number: [id number] Patient Account Number: [id number] Date of Birth/Gender: [birth year] (83 y.o. Female) Treating RN: Cornell Barman Primary Care Physician: Emily Filbert Other Clinician: Referring Physician: Emily Filbert Treating Physician/Extender: Frann Rider in Treatment: 5 Education Assessment Education Provided To: Patient Education Topics Provided Wound/Skin Impairment: Handouts: Caring for Your Ulcer, Other: continue wound care as prescribed Methods: Demonstration, Explain/Verbal Responses: State content correctly Electronic Signature(s) Signed: 02/23/2016 4:19:38 PM By: [name], RN, [name], [name] RN, [name] Entered By: [name], RN, [name], [name] on 02/23/2016 13:22:32 Natasha [name], Natasha [name] ([id number]) -------------------------------------------------------------------------------- Wound Assessment Details Patient Name: Natasha [name] Date of Service: 02/23/2016 12:45 PM Medical Record Number: [id number] Patient Account Number: [id number] Date of Birth/Sex: 09-07-1928 (83 y.o. Female) Treating RN: Cornell Barman Primary Care Gladie Gravette: Emily Filbert Other Clinician: Referring [place]: Emily Filbert Treating Hollee Fate/Extender: Frann Rider in Treatment: 5 Wound Status Wound Number: 1 Primary Open Surgical Wound Etiology: Wound Location: Left Upper Leg - Medial Wound Open Wounding Event: Surgical Injury Status: Date Acquired: 12/04/2015 Comorbid Anemia, Asthma, Hypertension, Weeks Of Treatment: 5 History: Peripheral Venous Disease, Clustered Wound: No Osteoarthritis, Neuropathy Photos Wound Measurements Length: (cm) 2 Width: (cm) 0.4 Depth: (cm) 0.9 Area: (cm) 0.628 Volume: (cm) 0.565 % Reduction in Area: 33.3% % Reduction in Volume:  57.2% Epithelialization: Small (1-33%) Tunneling: No Undermining: No Wound Description Full Thickness With Exposed Classification: Support Structures Wound Margin: Epibole Exudate Medium Amount: Exudate Type: Serosanguineous Exudate Color: red, brown Foul Odor After Cleansing: No Slough/Fibrino Yes Wound Bed Granulation Amount: Large (67-100%) Exposed Structure Granulation Quality: Red, Pink Fascia Exposed: No Necrotic Amount: Small (1-33%) Fat Layer (Subcutaneous Tissue) Exposed: Yes Necrotic Quality: Adherent Slough Tendon Exposed:  No Muscle Exposed: No Natasha [name], Natasha [name] ([id number]) Joint Exposed: No Bone Exposed: No Periwound Skin Texture Texture Color No Abnormalities Noted: No No Abnormalities Noted: No Scarring: Yes Erythema: No Moisture Temperature / Pain No Abnormalities Noted: No Temperature: No Abnormality Tenderness on Palpation: Yes Wound Preparation Ulcer Cleansing: Rinsed/Irrigated with Saline Topical Anesthetic Applied: Other: lidocaine 4%, Treatment Notes Wound #1 (Left, Medial Upper Leg) 1. Cleansed with: Clean wound with Normal Saline 2. Anesthetic Topical Lidocaine 4% cream to wound bed prior to debridement 3. Peri-wound Care: Skin Prep 4. Dressing Applied: Medihoney Gel Iodoform packing Gauze 5. Secondary Dressing Applied Bordered Foam Dressing Notes 1 medihoney; 2-1/4 iodoform Electronic Signature(s) Signed: 02/23/2016 4:19:38 PM By: [name], RN, [name], [name] RN, [name] Entered By: [name], RN, [name], [name] on 02/23/2016 12:54:46 Natasha [name] ([id number]) -------------------------------------------------------------------------------- Wound Assessment Details Patient Name: Natasha [name] Date of Service: 02/23/2016 12:45 PM Medical Record Number: [id number] Patient Account Number: [id number] Date of Birth/Sex: 12-22-1928 (83 y.o. Female) Treating RN: Cornell Barman Primary Care Callaway Hailes: Emily Filbert Other Clinician: Referring  Jamecia Lerman: Emily Filbert Treating Attikus Bartoszek/Extender: Frann Rider in Treatment: 5 Wound Status Wound Number: 2 Primary Open Surgical Wound Etiology: Wound Location: Left, Proximal Lower Leg Wound Healed - Epithelialized Wounding Event: Surgical Injury Status: Date Acquired: 12/04/2015 Comorbid Anemia, Asthma, Hypertension, Weeks Of Treatment: 5 History: Peripheral Venous Disease, Clustered Wound: No Osteoarthritis, Neuropathy Photos Wound Measurements Length: (cm) 0 % Reduction i Width: (cm) 0 % Reduction i Depth: (cm) 0 Epithelializa Area: (cm) 0 Tunneling: Volume: (cm) 0 Undermining: n Area: 100% n Volume: 100% tion: Large (67-100%) No No Wound Description Full Thickness With Exposed Classification: Support Structures Wound Margin: Distinct, outline attached Exudate None Present Amount: Foul Odor After Cleansing: No Slough/Fibrino Yes Wound Bed Granulation Amount: None Present (0%) Exposed Structure Necrotic Amount: None Present (0%) Fascia Exposed: No Fat Layer (Subcutaneous Tissue) Exposed: No Tendon Exposed: No Muscle Exposed: No Joint Exposed: No Bone Exposed: No Natasha [name], Natasha [name] ([id number]) Limited to Skin Breakdown Periwound Skin Texture Texture Color No Abnormalities Noted: No No Abnormalities Noted: No Callus: No Atrophie Blanche: No Crepitus: No Cyanosis: No Excoriation: No Ecchymosis: No Induration: No Erythema: No Rash: No Hemosiderin Staining: No Scarring: Yes Mottled: No Pallor: No Moisture Rubor: No No Abnormalities Noted: No Dry / Scaly: No Temperature / Pain Maceration: No Temperature: No Abnormality Wound Preparation Ulcer Cleansing: Rinsed/Irrigated with Saline Topical Anesthetic Applied: None Electronic Signature(s) Signed: 02/23/2016 4:19:38 PM By: [name], RN, [name], [name] RN, [name] Entered By: [name], RN, [name], [name] on 02/23/2016 13:10:49 Natasha [name]  ([id number]) -------------------------------------------------------------------------------- Wound Assessment Details Patient Name: Natasha [name] Date of Service: 02/23/2016 12:45 PM Medical Record Number: [id number] Patient Account Number: [id number] Date of Birth/Sex: 02-Dec-1928 (83 y.o. Female) Treating RN: Cornell Barman Primary Care Galan Ghee: Emily Filbert Other Clinician: Referring Dominik Yordy: Emily Filbert Treating Kurtiss Wence/Extender: Frann Rider in Treatment: 5 Wound Status Wound Number: 3 Primary Open Surgical Wound Etiology: Wound Location: Left Lower Leg - Distal Wound Open Wounding Event: Surgical Injury Status: Date Acquired: 12/04/2015 Comorbid Anemia, Asthma, Hypertension, Weeks Of Treatment: 5 History: Peripheral Venous Disease, Clustered Wound: No Osteoarthritis, Neuropathy Photos Wound Measurements Length: (cm) 6 Width: (cm) 1 Depth: (cm) 0.5 Area: (cm) 4.712 Volume: (cm) 2.356 % Reduction in Area: 73.3% % Reduction in Volume: 81% Epithelialization: None Tunneling: No Undermining: No Wound Description Full Thickness With Exposed Classification: Support Structures Wound Margin: Epibole Exudate Large Amount: Exudate Type: Serosanguineous Exudate Color: red, brown Foul Odor After Cleansing: No Slough/Fibrino  Yes Wound Bed Granulation Amount: Small (1-33%) Exposed Structure Granulation Quality: Red, Pink, Hyper-granulation Fascia Exposed: No Necrotic Amount: Large (67-100%) Fat Layer (Subcutaneous Tissue) Exposed: Yes Necrotic Quality: Adherent Slough Tendon Exposed: No Muscle Exposed: No AUDIA, AMICK ([id number]) Joint Exposed: No Bone Exposed: No Periwound Skin Texture Texture Color No Abnormalities Noted: No No Abnormalities Noted: No Callus: No Atrophie Blanche: No Crepitus: No Cyanosis: No Excoriation: No Ecchymosis: No Induration: No Erythema: No Rash: No Hemosiderin Staining: No Scarring: Yes Mottled:  No Pallor: No Moisture Rubor: No No Abnormalities Noted: No Dry / Scaly: No Temperature / Pain Maceration: No Temperature: No Abnormality Tenderness on Palpation: Yes Wound Preparation Ulcer Cleansing: Rinsed/Irrigated with Saline Topical Anesthetic Applied: Other: lidocaine 4%, Treatment Notes Wound #3 (Left, Distal Lower Leg) 1. Cleansed with: Clean wound with Normal Saline 2. Anesthetic Topical Lidocaine 4% cream to wound bed prior to debridement 3. Peri-wound Care: Skin Prep 4. Dressing Applied: Medihoney Gel Iodoform packing Gauze 5. Secondary Dressing Applied Bordered Foam Dressing Notes 1 medihoney; 2-1/4 iodoform Electronic Signature(s) Signed: 02/23/2016 4:19:38 PM By: [name], RN, [name], [name] RN, [name] Entered By: [name], RN, [name], [name] on 02/23/2016 12:55:57 Natasha [name] ([id number]) -------------------------------------------------------------------------------- Vitals Details Patient Name: Natasha [name] Date of Service: 02/23/2016 12:45 PM Medical Record Number: [id number] Patient Account Number: [id number] Date of Birth/Sex: 1928-06-12 (83 y.o. Female) Treating RN: Cornell Barman Primary Care Bana Borgmeyer: Emily Filbert Other Clinician: Referring Asyria Kolander: Emily Filbert Treating Troyce Febo/Extender: Frann Rider in Treatment: 5 Vital Signs Time Taken: 12:43 Temperature (F): 98.2 Height (in): 59 Pulse (bpm): 87 Weight (lbs): 140 Respiratory Rate (breaths/min): 16 Body Mass Index (BMI): 28.3 Blood Pressure (mmHg): 129/53 Reference Range: 80 - 120 mg / dl Electronic Signature(s) Signed: 02/23/2016 4:19:38 PM By: [name], RN, [name], [name] RN, [name] Entered By: [name], RN, [name], [name] on 02/23/2016 12:43:35

## 2016-02-24 ENCOUNTER — Ambulatory Visit: Payer: Medicare Other | Admitting: Podiatry

## 2016-03-01 ENCOUNTER — Encounter: Payer: Medicare Other | Admitting: Surgery

## 2016-03-01 DIAGNOSIS — E11622 Type 2 diabetes mellitus with other skin ulcer: Secondary | ICD-10-CM | POA: Diagnosis not present

## 2016-03-01 NOTE — Progress Notes (Signed)
ODESTER, Natasha [name] ([id number]) Visit Report for 03/01/2016 Chief Complaint Document Details Patient Name: Natasha [name], Natasha [name] Date of Service: 03/01/2016 12:45 PM Medical Record Number: [id number] Patient Account Number: [id number] Date of Birth/Sex: [birth year] (83 y.o. Female) Treating RN: Cornell Barman Primary Care Provider: Emily Filbert Other Clinician: Referring Provider: Emily Filbert Treating Provider/Extender: Frann Rider in Treatment: 6 Information Obtained from: Patient Chief Complaint Patient presents to the wound care center today with an open arterial ulcer along with diabetes mellitus to the left lower extremity which she's had for over a month Electronic Signature(s) Signed: 03/01/2016 1:48:05 PM By: Christin Fudge MD, FACS Entered By: Christin Fudge on 03/01/2016 13:48:05 Natasha [name] ([id number]) -------------------------------------------------------------------------------- Debridement Details Patient Name: Natasha [name] Date of Service: 03/01/2016 12:45 PM Medical Record Number: [id number] Patient Account Number: [id number] Date of Birth/Sex: [birth year] (83 y.o. Female) Treating RN: Cornell Barman Primary Care Provider: Emily Filbert Other Clinician: Referring Provider: Emily Filbert Treating Provider/Extender: Frann Rider in Treatment: 6 Debridement Performed for Wound #3 Left,Distal Lower Leg Assessment: Performed By: Physician Christin Fudge, MD Debridement: Debridement Pre-procedure Yes - 13:05 Verification/Time Out Taken: Start Time: 13:06 Pain Control: Other : lidocaine 4% Level: Skin/Subcutaneous Tissue Total Area Debrided (L x 3.8 (cm) x 1.2 (cm) = 4.56 (cm) W): Tissue and other Viable, Non-Viable, Fibrin/Slough, Subcutaneous material debrided: Instrument: Curette Bleeding: Minimum Hemostasis Achieved: Pressure End Time: 13:08 Procedural Pain: 1 Post Procedural Pain: 1 Response to Treatment: Procedure was tolerated  well Post Debridement Measurements of Total Wound Length: (cm) 3.8 Width: (cm) 1.2 Depth: (cm) 0.5 Volume: (cm) 1.791 Character of Wound/Ulcer Post Requires Further Debridement Debridement: Severity of Tissue Post Debridement: Fat layer exposed Post Procedure Diagnosis Same as Pre-procedure Electronic Signature(s) Signed: 03/01/2016 1:47:21 PM By: Christin Fudge MD, FACS Signed: 03/01/2016 4:49:17 PM By: [name] RN, [name], [name] RN, [name] Entered By: Christin Fudge on 03/01/2016 13:47:21 Natasha [name], Natasha [name] ([id number]) Natasha [name], Natasha [name] ([id number]) -------------------------------------------------------------------------------- HPI Details Patient Name: Natasha [name] Date of Service: 03/01/2016 12:45 PM Medical Record Number: [id number] Patient Account Number: [id number] Date of Birth/Sex: 09-08-1928 (83 y.o. Female) Treating RN: Cornell Barman Primary Care Provider: Emily Filbert Other Clinician: Referring Provider: Emily Filbert Treating Provider/Extender: Frann Rider in Treatment: 6 History of Present Illness Location: left thigh and left calf in the area where there was previous vascular surgery Quality: Patient reports experiencing a dull pain to affected area(s). Severity: Patient states wound (s) are getting better. Duration: Patient has had the wound for > 2 months prior to seeking treatment at the wound center Timing: Pain in wound is Intermittent (comes and goes Context: The wound occurred when the patient had vascular surgery and the wounds were not healing very well Modifying Factors: Other treatment(s) tried include:local care and oral antibiotics Associated Signs and Symptoms: Patient reports having increase swelling. HPI Description: 83 year old female with significant past medical history of anemia, cellulitis and abscess of the leg, COPD, coronary artery disease, hypertension, osteoarthritis, peripheral vascular disease with claudication, trigeminal  neuralgia and type 2 diabetes mellitus without complications. A past surgical history significant for appendectomy, left femoral o peroneal bypass graft in October 2017, and debridement of skin and subcutis tissue on the left side in December 2017. She was treated by Dr. Lucky Cowboy earlier, with multiple left leg revascularization both endovascular and open. She is a former smoker who quit 30 years ago. When she was last seen at [facility], at the vascular surgery clinic she had a left lower extremity 3 separate wounds along  the saphenectomy site. She was placed on Bactrim DS for 10 days. She had a good signal over the peroneal artery and the bypass remains patent. Prior to surgery her left ABI was 0.46 and a right was 0.98. Note the patient was seen by Dr. Amalia Hailey of podiatry on 01/15/2016 and he saw for ulceration of her left great toe. Most recently the patient was seen by Dr. Maura Crandall, the vascular surgeon on January 5 and these notes are reviewed on Epic-- he noted that the 3 wounds on the left lower extremity are granulation very slowly, and he recommended packing with wet-to-dry Kerlix. The surgeon noted excellent signal over the peroneal artery and the recommendation was to see as at the wound center to help with healing. 02/12/2016 -- we have not seen her for 2 weeks and her husband tells me that she was admitted to the hospital with a pneumonia. I have reviewed a hospital notes, she was there between January 19 and January 22, with clinical sepsis and bilateral pneumonia, fever and leukocytosis and was treated with vancomycin and Zosyn. Vancomycin was then stopped and Zithromax was added. He was discharged home on Augmentin 02/23/2016 -- she had a arterial duplex study done recently at [facility] and her right ABI was 0.92 and the left ABI was 0.96. The femoral to peroneal bypass is widely patent. she was also seen by the surgeon who noted a postoperative seroma in the left groin. she was  asked to return in 3 months for duplex examination 03/01/16 -she has got a lot of lymphedema for left lower extremity and there is weeping from the lower lateral leg. She had been walking around a lot yesterday as she had gone shopping. Natasha [name], Natasha [name] ([id number]) Electronic Signature(s) Signed: 03/01/2016 1:49:12 PM By: Christin Fudge MD, FACS Entered By: Christin Fudge on 03/01/2016 13:49:12 Natasha [name], Natasha [name] ([id number]) -------------------------------------------------------------------------------- Physical Exam Details Patient Name: Natasha [name] Date of Service: 03/01/2016 12:45 PM Medical Record Number: [id number] Patient Account Number: [id number] Date of Birth/Sex: December 22, 1928 (83 y.o. Female) Treating RN: Cornell Barman Primary Care Provider: Emily Filbert Other Clinician: Referring Provider: Emily Filbert Treating Provider/Extender: Frann Rider in Treatment: 6 Constitutional . Pulse regular. Respirations normal and unlabored. Afebrile. . Eyes Nonicteric. Reactive to light. Ears, Nose, Mouth, and Throat Lips, teeth, and gums WNL.Marland Kitchen Moist mucosa without lesions. Neck supple and nontender. No palpable supraclavicular or cervical adenopathy. Normal sized without goiter. Respiratory WNL. No retractions.. Breath sounds WNL, No rubs, rales, rhonchi, or wheeze.. Cardiovascular Heart rhythm and rate regular, no murmur or gallop.. Pedal Pulses WNL. No clubbing, cyanosis or edema. Chest Breasts symmetical and no nipple discharge.. Breast tissue WNL, no masses, lumps, or tenderness.. Lymphatic No adneopathy. No adenopathy. No adenopathy. Musculoskeletal Adexa without tenderness or enlargement.. Digits and nails w/o clubbing, cyanosis, infection, petechiae, ischemia, or inflammatory conditions.. Integumentary (Hair, Skin) No suspicious lesions. No crepitus or fluctuance. No peri-wound warmth or erythema. No masses.Marland Kitchen Psychiatric Judgement and insight Intact.. No  evidence of depression, anxiety, or agitation.. Notes The superior wound looks very good and has 2 sinus tracks both clean. No debridement was required for this. The inferior wound had subcutaneous debris which I sharply removed with a #3 curet and bleeding controlled with pressure. She will continue local care as before Electronic Signature(s) Signed: 03/01/2016 1:50:34 PM By: Christin Fudge MD, FACS Entered By: Christin Fudge on 03/01/2016 13:50:34 Natasha [name] ([id number]) -------------------------------------------------------------------------------- Physician Orders Details Patient Name: Natasha [name] Date of Service: 03/01/2016 12:45  PM Medical Record Number: [id number] Patient Account Number: [id number] Date of Birth/Sex: August 08, 1928 (83 y.o. Female) Treating RN: Cornell Barman Primary Care Provider: Emily Filbert Other Clinician: Referring Provider: Emily Filbert Treating Provider/Extender: Frann Rider in Treatment: 6 Verbal / Phone Orders: No Diagnosis Coding Wound Cleansing Wound #1 Left,Medial Upper Leg o Clean wound with Normal Saline. o Cleanse wound with mild soap and water Wound #3 Left,Distal Lower Leg o Clean wound with Normal Saline. o Cleanse wound with mild soap and water Anesthetic Wound #1 Left,Medial Upper Leg o Topical Lidocaine 4% cream applied to wound bed prior to debridement - for clinic use Wound #3 Left,Distal Lower Leg o Topical Lidocaine 4% cream applied to wound bed prior to debridement - for clinic use Skin Barriers/Peri-Wound Care Wound #1 Left,Medial Upper Leg o Skin Prep Wound #3 Left,Distal Lower Leg o Skin Prep Primary Wound Dressing Wound #1 Left,Medial Upper Leg o Iodoform packing Gauze Wound #3 Left,Distal Lower Leg o Medihoney gel Secondary Dressing Wound #1 Left,Medial Upper Leg o Boardered Foam Dressing Wound #3 Left,Distal Lower Leg o ABD and Kerlix/Conform Natasha [name], Natasha [name].  ([id number]) Dressing Change Frequency Wound #1 Left,Medial Upper Leg o Change dressing every other day. Wound #3 Left,Distal Lower Leg o Change dressing every other day. Follow-up Appointments Wound #1 Left,Medial Upper Leg o Return Appointment in 1 week. Wound #3 Left,Distal Lower Leg o Return Appointment in 1 week. Edema Control Wound #1 Left,Medial Upper Leg o Elevate legs to the level of the heart and pump ankles as often as possible Wound #3 Left,Distal Lower Leg o Elevate legs to the level of the heart and pump ankles as often as possible Additional Orders / Instructions Wound #1 Left,Medial Upper Leg o Increase protein intake. Wound #3 Left,Distal Lower Leg o Increase protein intake. Home Health Wound #1 Left,Medial Upper Leg o Continue Home Health Visits o Home Health Nurse may visit PRN to address patientos wound care needs. o FACE TO FACE ENCOUNTER: MEDICARE and MEDICAID PATIENTS: I certify that this patient is under my care and that I had a face-to-face encounter that meets the physician face-to-face encounter requirements with this patient on this date. The encounter with the patient was in whole or in part for the following MEDICAL CONDITION: (primary reason for [place]) MEDICAL NECESSITY: I certify, that based on my findings, NURSING services are a medically necessary home health service. HOME BOUND STATUS: I certify that my clinical findings support that this patient is homebound (i.e., Due to illness or injury, pt requires aid of supportive devices such as crutches, cane, wheelchairs, walkers, the use of special transportation or the assistance of another person to leave their place of residence. There is a normal inability to leave the home and doing so requires considerable and taxing effort. Other absences are for medical reasons / religious services and are infrequent or of short duration when for other reasons). Natasha [name], Natasha [name] ([id number]) o If current dressing causes regression in wound condition, may D/C ordered dressing product/s and apply Normal Saline Moist Dressing daily until next [place] / Other MD appointment. [place] of regression in wound condition at [phone]. o Please direct any NON-WOUND related issues/requests for orders to patient's Primary Care Physician Wound #3 Left,Distal Lower Leg o [place] Nurse may visit PRN to address patientos wound care needs. o FACE TO FACE ENCOUNTER: MEDICARE and MEDICAID PATIENTS: I certify that this patient is under my care and that  I had a face-to-face encounter that meets the physician face-to-face encounter requirements with this patient on this date. The encounter with the patient was in whole or in part for the following MEDICAL CONDITION: (primary reason for [place]) MEDICAL NECESSITY: I certify, that based on my findings, NURSING services are a medically necessary home health service. HOME BOUND STATUS: I certify that my clinical findings support that this patient is homebound (i.e., Due to illness or injury, pt requires aid of supportive devices such as crutches, cane, wheelchairs, walkers, the use of special transportation or the assistance of another person to leave their place of residence. There is a normal inability to leave the home and doing so requires considerable and taxing effort. Other absences are for medical reasons / religious services and are infrequent or of short duration when for other reasons). o If current dressing causes regression in wound condition, may D/C ordered dressing product/s and apply Normal Saline Moist Dressing daily until next [place] / Other MD appointment. [place] of regression in wound condition at [phone]. o Please direct any NON-WOUND related issues/requests for orders to  patient's Primary Care Physician Electronic Signature(s) Signed: 03/01/2016 3:34:28 PM By: Christin Fudge MD, FACS Signed: 03/01/2016 4:49:17 PM By: [name] RN, [name], [name] RN, [name] Entered By: [name], RN, [name], [name] on 03/01/2016 13:09:22 Natasha [name] ([id number]) -------------------------------------------------------------------------------- Problem List Details Patient Name: Natasha [name] Date of Service: 03/01/2016 12:45 PM Medical Record Number: [id number] Patient Account Number: [id number] Date of Birth/Sex: 1928-07-26 (83 y.o. 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 01/19/2016 Yes exposed Inactive Problems Resolved Problems Electronic Signature(s) Signed: 03/01/2016 1:46:35 PM By: Christin Fudge MD, FACS Previous Signature: 03/01/2016 1:46:08 PM Version By: Christin Fudge MD, FACS Previous Signature: 03/01/2016 1:15:40 PM Version By: Christin Fudge MD, FACS Previous Signature: 03/01/2016 1:14:59 PM Version By: Christin Fudge MD, FACS Entered By: Christin Fudge on 03/01/2016 13:46:35 Natasha [name] ([id number]) -------------------------------------------------------------------------------- Progress Note Details Patient Name: Natasha [name] Date of Service: 03/01/2016 12:45 PM Medical Record Number: [id number] Patient Account Number: [id number] Date of Birth/Sex: 04-10-1928 (83 y.o. Female) Treating RN: Cornell Barman Primary Care Provider: Emily Filbert Other Clinician: Referring Provider: Emily Filbert Treating  Provider/Extender: Frann Rider in Treatment: 6 Subjective Chief Complaint Information obtained from Patient Patient presents to the wound care center today with an open arterial ulcer along with diabetes mellitus to the left lower extremity which she's had for over a month History of Present Illness (HPI) The following HPI elements were documented for the patient's wound: Location: left thigh and left calf in the area where there was previous vascular surgery Quality: Patient reports experiencing a dull pain to affected area(s). Severity: Patient states wound (s) are getting better. Duration: Patient has had the wound for > 2 months prior to seeking treatment at the wound center Timing: Pain in wound is Intermittent (comes and goes Context: The wound occurred when the patient had vascular surgery and the wounds were not healing very well Modifying Factors: Other treatment(s) tried include:local care and oral antibiotics Associated Signs and Symptoms: Patient reports having increase swelling. 83 year old female  with significant past medical history of anemia, cellulitis and abscess of the leg, COPD, coronary artery disease, hypertension, osteoarthritis, peripheral vascular disease with claudication, trigeminal neuralgia and type 2 diabetes mellitus without complications. A past surgical history significant for appendectomy, left femoral peroneal bypass graft in October 2017, and debridement of skin and subcutis tissue on the left side in December 2017. She was treated by Dr. Lucky Cowboy earlier, with multiple left leg revascularization both endovascular and open. She is a former smoker who quit 30 years ago. When she was last seen at [facility], at the vascular surgery clinic she had a left lower extremity 3 separate wounds along the saphenectomy site. She was placed on Bactrim DS for 10 days. She had a good signal over the peroneal artery and the bypass remains patent. Prior to surgery her left  ABI was 0.46 and a right was 0.98. Note the patient was seen by Dr. Amalia Hailey of podiatry on 01/15/2016 and he saw for ulceration of her left great toe. Most recently the patient was seen by Dr. Maura Crandall, the vascular surgeon on January 5 and these notes are reviewed on Epic-- he noted that the 3 wounds on the left lower extremity are granulation very slowly, and he recommended packing with wet-to-dry Kerlix. The surgeon noted excellent signal over the peroneal artery and the recommendation was to see as at the wound center to help with healing. 02/12/2016 -- we have not seen her for 2 weeks and her husband tells me that she was admitted to the hospital with a pneumonia. I have reviewed a hospital notes, she was there between January 19 and January 22, with clinical sepsis and bilateral pneumonia, fever and leukocytosis and was treated with AVANELLE, PIXLEY. ([id number]) vancomycin and Zosyn. Vancomycin was then stopped and Zithromax was added. He was discharged home on Augmentin 02/23/2016 -- she had a arterial duplex study done recently at [facility] and her right ABI was 0.92 and the left ABI was 0.96. The femoral to peroneal bypass is widely patent. she was also seen by the surgeon who noted a postoperative seroma in the left groin. she was asked to return in 3 months for duplex examination 03/01/16 -she has got a lot of lymphedema for left lower extremity and there is weeping from the lower lateral leg. She had been walking around a lot yesterday as she had gone shopping. Objective Constitutional Pulse regular. Respirations normal and unlabored. Afebrile. Vitals Time Taken: 12:47 PM, Height: 59 in, Weight: 140 lbs, BMI: 28.3, Temperature: 98.5 F, Pulse: 84 bpm, Respiratory Rate: 16 breaths/min, Blood Pressure: 118/60 mmHg. Eyes Nonicteric. Reactive to light. Ears, Nose, Mouth, and Throat Lips, teeth, and gums WNL.Marland Kitchen Moist mucosa without lesions. Neck supple and nontender.  No palpable supraclavicular or cervical adenopathy. Normal sized without goiter. Respiratory WNL. No retractions.. Breath sounds WNL, No rubs, rales, rhonchi, or wheeze.. Cardiovascular Heart rhythm and rate regular, no murmur or gallop.. Pedal Pulses WNL. No clubbing, cyanosis or edema. Chest Breasts symmetical and no nipple discharge.. Breast tissue WNL, no masses, lumps, or tenderness.. Lymphatic No adneopathy. No adenopathy. No adenopathy. Musculoskeletal Adexa without tenderness or enlargement.. Digits and nails w/o clubbing, cyanosis, infection, petechiae, ischemia, or inflammatory conditions.Marland Kitchen Psychiatric Natasha [name], Natasha [name] ([id number]) Judgement and insight Intact.. No evidence of depression, anxiety, or agitation.. General Notes: The superior wound looks very good and has 2 sinus tracks both clean. No debridement was required for this. The inferior wound had subcutaneous debris which I sharply removed with a #3  curet and bleeding controlled with pressure. She will continue local care as before Integumentary (Hair, Skin) No suspicious lesions. No crepitus or fluctuance. No peri-wound warmth or erythema. No masses.. Wound #1 status is Open. Original cause of wound was Surgical Injury. The wound is located on the Left,Medial Upper Leg. The wound measures 2cm length x 0.4cm width x 0.9cm depth; 0.628cm^2 area and 0.565cm^3 volume. There is Fat Layer (Subcutaneous Tissue) Exposed exposed. There is no tunneling noted. There is a medium amount of serosanguineous drainage noted. The wound margin is epibole. There is large (67-100%) red, pink granulation within the wound bed. There is a small (1-33%) amount of necrotic tissue within the wound bed including Adherent Slough. The periwound skin appearance exhibited: Scarring. The periwound skin appearance did not exhibit: Erythema. Periwound temperature was noted as No Abnormality. The periwound has tenderness on palpation. Wound #3 status  is Open. Original cause of wound was Surgical Injury. The wound is located on the Left,Distal Lower Leg. The wound measures 3.8cm length x 1.2cm width x 0.5cm depth; 3.581cm^2 area and 1.791cm^3 volume. There is Fat Layer (Subcutaneous Tissue) Exposed exposed. There is no tunneling or undermining noted. There is a large amount of serosanguineous drainage noted. The wound margin is epibole. There is small (1-33%) red, pink granulation within the wound bed. There is a large (67-100%) amount of necrotic tissue within the wound bed including Adherent Slough. The periwound skin appearance exhibited: Scarring. The periwound skin appearance did not exhibit: Callus, Crepitus, Excoriation, Induration, Rash, Dry/Scaly, Maceration, Atrophie Blanche, Cyanosis, Ecchymosis, Hemosiderin Staining, Mottled, Pallor, Rubor, Erythema. Periwound temperature was noted as No Abnormality. The periwound has tenderness on palpation. Assessment Active Problems ICD-10 I70.242 - Atherosclerosis 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ration of calf L97.222 - Non-pressure chronic ulcer of left calf with fat layer exposed L97.122 - 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 exposed Procedures Wound #3 Natasha [name], Natasha [name] ([id number]) Wound #3 is an Open Surgical Wound located on the Left,Distal Lower Leg . There was a Skin/Subcutaneous Tissue Debridement ([id number]) debridement with total area of 4.56 sq cm performed by Christin Fudge, MD. with the following instrument(s): Curette to remove Viable and Non-Viable tissue/material including Fibrin/Slough and Subcutaneous after achieving pain control using Other (lidocaine 4%). A time out was conducted at 13:05, prior to the start of the procedure. A Minimum amount of bleeding was controlled with Pressure. The procedure was tolerated well with a pain level of 1 throughout and a pain level of 1 following the procedure. Post Debridement Measurements: 3.8cm length  x 1.2cm width x 0.5cm depth; 1.791cm^3 volume. Character of Wound/Ulcer Post Debridement requires further debridement. Severity of Tissue Post Debridement is: Fat layer exposed. Post procedure Diagnosis Wound #3: Same as Pre-Procedure Plan Wound Cleansing: Wound #1 Left,Medial Upper Leg: Clean wound with Normal Saline. Cleanse wound with mild soap and water Wound #3 Left,Distal Lower Leg: Clean wound with Normal Saline. Cleanse wound with mild soap and water Anesthetic: Wound #1 Left,Medial Upper Leg: Topical Lidocaine 4% cream applied to wound bed prior to debridement - for clinic use Wound #3 Left,Distal Lower Leg: Topical Lidocaine 4% cream applied to wound bed prior to debridement - for clinic use Skin Barriers/Peri-Wound Care: Wound #1 Left,Medial Upper Leg: Skin Prep Wound #3 Left,Distal Lower Leg: Skin Prep Primary Wound Dressing: Wound #1 Left,Medial Upper Leg: Iodoform packing Gauze Wound #3 Left,Distal Lower Leg: Medihoney gel Secondary Dressing: Wound #1 Left,Medial Upper Leg: Boardered Foam Dressing Wound #3 Left,Distal Lower  Leg: ABD and Kerlix/Conform Dressing Change Frequency: Wound #1 Left,Medial Upper Leg: Change dressing every other day. Wound #3 Left,Distal Lower Leg: Natasha [name], Natasha [name] ([id number]) Change dressing every other day. Follow-up Appointments: Wound #1 Left,Medial Upper Leg: Return Appointment in 1 week. Wound #3 Left,Distal Lower Leg: Return Appointment in 1 week. Edema Control: Wound #1 Left,Medial Upper Leg: Elevate legs to the level of the heart and pump ankles as often as possible Wound #3 Left,Distal Lower Leg: Elevate legs to the level of the heart and pump ankles as often as possible Additional Orders / Instructions: Wound #1 Left,Medial Upper Leg: Increase protein intake. Wound #3 Left,Distal Lower Leg: Increase protein intake. Home Health: Wound #1 Left,Medial Upper Leg: [place] Nurse  may visit PRN to address patient s wound care needs. FACE TO FACE ENCOUNTER: MEDICARE and MEDICAID PATIENTS: I certify that this patient is under my care and that I had a face-to-face encounter that meets the physician face-to-face encounter requirements with this patient on this date. The encounter with the patient was in whole or in part for the following MEDICAL CONDITION: (primary reason for [place]) MEDICAL NECESSITY: I certify, that based on my findings, NURSING services are a medically necessary home health service. HOME BOUND STATUS: I certify that my clinical findings support that this patient is homebound (i.e., Due to illness or injury, pt requires aid of supportive devices such as crutches, cane, wheelchairs, walkers, the use of special transportation or the assistance of another person to leave their place of residence. There is a normal inability to leave the home and doing so requires considerable and taxing effort. Other absences are for medical reasons / religious services and are infrequent or of short duration when for other reasons). If current dressing causes regression in wound condition, may D/C ordered dressing product/s and apply Normal Saline Moist Dressing daily until next [place] / Other MD appointment. [place] of regression in wound condition at [phone]. Please direct any NON-WOUND related issues/requests for orders to patient's Primary Care Physician Wound #3 Left,Distal Lower Leg: [place] Nurse may visit PRN to address patient s wound care needs. FACE TO FACE ENCOUNTER: MEDICARE and MEDICAID PATIENTS: I certify that this patient is under my care and that I had a face-to-face encounter that meets the physician face-to-face encounter requirements with this patient on this date. The encounter with the patient was in whole or in part for the following MEDICAL CONDITION: (primary reason for  [employer]) MEDICAL NECESSITY: I certify, that based on my findings, NURSING services are a medically necessary home health service. HOME BOUND STATUS: I certify that my clinical findings support that this patient is homebound (i.e., Due to illness or injury, pt requires aid of supportive devices such as crutches, cane, wheelchairs, walkers, the use of special transportation or the assistance of another person to leave their place of residence. There is a normal inability to leave the home and doing so requires considerable and taxing effort. Other absences are for medical reasons / religious services and are infrequent or of short duration when for other reasons). If current dressing causes regression in wound condition, may D/C ordered dressing product/s and apply Normal Saline Moist Dressing daily until next [place] / Other MD appointment. [place] of regression in wound condition at [phone]. Please direct any NON-WOUND related issues/requests for orders to patient's Primary Care Physician Natasha [name], ZAUCHA ([id number]) I  have cautioned her about walking around too much and have recommended elevation and exercise. I have recommended: 1. packing of the thigh wound with 1/4 inch iodoform gauze and an appropriate foam border. 2. the wound lower down will be covered with Medihoney and an appropriate bordered foam 3. continue to see the vascular surgeons at [facility] as requested -- recent investigation and report reviewed 4. Good amount of protein intake, vitamin A, vitamin C and zinc 5. Regular visits to the wound center Electronic Signature(s) Signed: 03/01/2016 1:51:57 PM By: Christin Fudge MD, FACS Entered By: Christin Fudge on 03/01/2016 13:51:57 Natasha [name] ([id number]) -------------------------------------------------------------------------------- SuperBill Details Patient Name: Natasha [name] Date of Service: 03/01/2016 Medical  Record Number: [id number] Patient Account Number: [id number] Date of Birth/Sex: 1928-09-24 (83 y.o. 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 exposed Facility Procedures CPT4 Code Description: [id number] 11042 - DEB SUBQ TISSUE 20 SQ CM/< ICD-10 Description Diagnosis I70.242 Atherosclerosis 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 L97.222 Non-pressure chronic ulcer of left calf with fat layer e L97.122 Non-pressure chronic  ulcer of left thigh with fat layer Modifier: ration of ca xposed exposed Quantity: 1 lf Physician Procedures CPT4 Code Description: [id number] 00762 - WC PHYS SUBQ TISS 20 SQ CM ICD-10 Description Diagnosis I70.242 Atherosclerosis 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 L97.222 Non-pressure chronic ulcer of left calf with fat layer e L97.122 Non-pressure chronic  ulcer of left thigh with fat layer Modifier: ration of ca xposed exposed Quantity: 1 lf Electronic Signature(s) Signed: 03/01/2016 1:52:07 PM By: Christin Fudge MD, FACS Entered By: Christin Fudge on 03/01/2016 13:52:06

## 2016-03-01 NOTE — Progress Notes (Signed)
Natasha [name], Natasha [name] ([id number]) Visit Report for 03/01/2016 Arrival Information Details Patient Name: Natasha [name], Natasha [name] Date of Service: 03/01/2016 12:45 PM Medical Record Number: [id number] Patient Account Number: [id number] Date of Birth/Sex: 06-01-1928 (83 y.o. Female) Treating RN: Cornell Barman Primary Care Caydee Talkington: Emily Filbert Other Clinician: Referring Dimitris Shanahan: Emily Filbert Treating Marcelia Petersen/Extender: Frann Rider in Treatment: 6 Visit Information History Since Last Visit Added or deleted any medications: No Patient Arrived: Natasha [name] Any new allergies or adverse reactions: No Arrival Time: 12:47 Had a fall or experienced change in No Accompanied By: husband activities of daily living that may affect Transfer Assistance: None risk of falls: Patient Identification Verified: Yes Signs or symptoms of abuse/neglect since last No Secondary Verification Process Yes visito Completed: Hospitalized since last visit: No Patient Requires Transmission- No Has Dressing in Place as Prescribed: Yes Based Precautions: Pain Present Now: No Patient Has Alerts: Yes Patient Alerts: Patient on Blood Thinner Plavix Electronic Signature(s) Signed: 03/01/2016 4:49:17 PM By: [name], RN, [name], [name] RN, [name] Entered By: [name], RN, [name], [name] on 03/01/2016 12:47:25 Natasha [name] ([id number]) -------------------------------------------------------------------------------- Encounter Discharge Information Details Patient Name: Natasha [name] Date of Service: 03/01/2016 12:45 PM Medical Record Number: [id number] Patient Account Number: [id number] Date of Birth/Sex: [birth year] (83 y.o. Female) Treating RN: Cornell Barman Primary Care Dameshia Seybold: Emily Filbert Other Clinician: Referring Antonie Borjon: Emily Filbert Treating Floree Zuniga/Extender: Frann Rider in Treatment: 6 Encounter Discharge Information Items Discharge Pain Level: 0 Discharge Condition: Stable Ambulatory Status:  Walker Discharge Destination: Home Transportation: Private Auto Accompanied By: husband Schedule Follow-up Appointment: Yes Medication Reconciliation completed Yes and provided to Patient/Care Nicholi Ghuman: Provided on Clinical Summary of Care: 03/01/2016 Form Type Recipient Paper Patient [facility] Electronic Signature(s) Signed: 03/01/2016 4:49:17 PM By: [name], RN, [name], [name] RN, [name] Previous Signature: 03/01/2016 1:18:13 PM Version By: Ruthine Dose Entered By: [name] RN, [name], [name] on 03/01/2016 13:22:32 Natasha [name] ([id number]) -------------------------------------------------------------------------------- Lower Extremity Assessment Details Patient Name: Natasha [name] Date of Service: 03/01/2016 12:45 PM Medical Record Number: [id number] Patient Account Number: [id number] Date of Birth/Sex: 04/21/1928 (83 y.o. Female) Treating RN: Cornell Barman Primary Care Tyrrell Stephens: Emily Filbert Other Clinician: Referring Teanna Elem: Emily Filbert Treating Aarilyn Dye/Extender: Frann Rider in Treatment: 6 Vascular Assessment Claudication: Claudication Assessment [Right:None] Pulses: Dorsalis Pedis Palpable: [Right:Yes] Posterior Tibial Palpable: [Right:Yes] Extremity colors, hair growth, and conditions: Extremity Color: [Right:Red] Hair Growth on Extremity: [Right:No] Temperature of Extremity: [Right:Warm] Capillary Refill: [Right:< 3 seconds] Dependent Rubor: [Right:No] Blanched when Elevated: [Right:No] Lipodermatosclerosis: [Right:No] Toe Nail Assessment Left: Right: Thick: Yes Discolored: Yes Deformed: Yes Improper Length and Hygiene: Yes Electronic Signature(s) Signed: 03/01/2016 4:49:17 PM By: [name], RN, [name], [name] RN, [name] Entered By: [name], RN, [name], [name] on 03/01/2016 12:59:00 Natasha [name] ([id number]) -------------------------------------------------------------------------------- Multi Wound Chart Details Patient Name: Natasha [name] Date of Service:  03/01/2016 12:45 PM Medical Record Number: [id number] Patient Account Number: [id number] Date of Birth/Sex: 08/10/1928 (83 y.o. Female) Treating RN: Cornell Barman Primary Care Adilene Areola: Emily Filbert Other Clinician: Referring Loribeth Katich: Emily Filbert Treating Faria Casella/Extender: Frann Rider in Treatment: 6 Vital Signs Height(in): 59 Pulse(bpm): 84 Weight(lbs): 140 Blood Pressure 118/60 (mmHg): Body Mass Index(BMI): 28 Temperature(F): 98.5 Respiratory Rate 16 (breaths/min): Photos: [N/A:N/A] Wound Location: Left Upper Leg - Medial Left Lower Leg - Distal N/A Wounding Event: Surgical Injury Surgical Injury N/A Primary Etiology: Open Surgical Wound Open Surgical Wound N/A Comorbid History: Anemia, Asthma, Anemia, Asthma, N/A Hypertension, Peripheral Hypertension, Peripheral Venous Disease, Venous Disease, Osteoarthritis, Neuropathy Osteoarthritis, Neuropathy Date Acquired: 12/04/2015 12/04/2015 N/A  Weeks of Treatment: 6 6 N/A Wound Status: Open Open N/A Measurements L x W x D 2x0.4x0.9 3.8x1.2x0.5 N/A (cm) Area (cm) : 0.628 3.581 N/A Volume (cm) : 0.565 1.791 N/A % Reduction in Area: 33.30% 79.70% N/A % Reduction in Volume: 57.20% 85.50% N/A Classification: Full Thickness With Full Thickness With N/A Exposed Support Exposed Support Structures Structures Exudate Amount: Medium Large N/A Exudate Type: Serosanguineous Serosanguineous N/A Exudate Color: red, brown red, brown N/A Wound Margin: Epibole Epibole N/A Granulation Amount: Large (67-100%) Small (1-33%) N/A Natasha [name], BRATZ. ([id number]) Granulation Quality: Red, Pink Red, Pink, Hyper- N/A granulation Necrotic Amount: Small (1-33%) Large (67-100%) N/A Exposed Structures: Fat Layer (Subcutaneous Fat Layer (Subcutaneous N/A Tissue) Exposed: Yes Tissue) Exposed: Yes Fascia: No Fascia: No Tendon: No Tendon: No Muscle: No Muscle: No Joint: No Joint: No Bone: No Bone: No Epithelialization: Small  (1-33%) None N/A Debridement: N/A Debridement ([id number]) Pre-procedure N/A 13:05 N/A Verification/Time Out Taken: Pain Control: N/A Other N/A Tissue Debrided: N/A Fibrin/Slough, N/A Subcutaneous Level: N/A Skin/Subcutaneous N/A Tissue Debridement Area (sq N/A 4.56 N/A cm): Instrument: N/A Curette N/A Bleeding: N/A Minimum N/A Hemostasis Achieved: N/A Pressure N/A Procedural Pain: N/A 1 N/A Post Procedural Pain: N/A 1 N/A Debridement Treatment N/A Procedure was tolerated N/A Response: well Post Debridement N/A 3.8x1.2x0.5 N/A Measurements L x W x D (cm) Post Debridement N/A 1.791 N/A Volume: (cm) Periwound Skin Texture: Scarring: Yes Scarring: Yes N/A Excoriation: No Induration: No Callus: No Crepitus: No Rash: No Periwound Skin No Abnormalities Noted Maceration: No N/A Moisture: Dry/Scaly: No Periwound Skin Color: Erythema: No Atrophie Blanche: No N/A Cyanosis: No Ecchymosis: No Erythema: No Hemosiderin Staining: No Mottled: No Natasha [name], Natasha [name] ([id number]) Pallor: No Rubor: No Temperature: No Abnormality No Abnormality N/A Tenderness on Yes Yes N/A Palpation: Wound Preparation: Ulcer Cleansing: Ulcer Cleansing: N/A Rinsed/Irrigated with Rinsed/Irrigated with Saline Saline Topical Anesthetic Topical Anesthetic Applied: Other: lidocaine Applied: Other: lidocaine 4% 4% Procedures Performed: N/A Debridement N/A Treatment Notes Wound #1 (Left, Medial Upper Leg) 1. Cleansed with: Clean wound with Normal Saline 2. Anesthetic Topical Lidocaine 4% cream to wound bed prior to debridement 4. Dressing Applied: Medihoney Gel Iodoform packing Gauze 5. Secondary Dressing Applied Bordered Foam Dressing ABD and Kerlix/Conform 7. Secured with Paper tape Wound #3 (Left, Distal Lower Leg) 1. Cleansed with: Clean wound with Normal Saline 2. Anesthetic Topical Lidocaine 4% cream to wound bed prior to debridement 4. Dressing Applied: Medihoney  Gel Iodoform packing Gauze 5. Secondary Dressing Applied Bordered Foam Dressing ABD and Kerlix/Conform 7. Secured with Microbiologist) Signed: 03/01/2016 1:46:42 PM By: Christin Fudge MD, FACS Entered By: Christin Fudge on 03/01/2016 13:46:42 Natasha [name], Natasha [name] ([id number]) Natasha [name], Natasha [name] ([id number]) -------------------------------------------------------------------------------- [place] Details Patient Name: Natasha [name] Date of Service: 03/01/2016 12:45 PM Medical Record Number: [id number] Patient Account Number: [id number] Date of Birth/Sex: Dec 27, 1928 (83 y.o. Female) Treating RN: Cornell Barman Primary Care Christopher Glasscock: Emily Filbert Other Clinician: Referring Kele Barthelemy: Emily Filbert Treating Latika Kronick/Extender: Frann Rider in Treatment: 6 Active Inactive ` Abuse / Safety / Falls / Self Care Management Nursing Diagnoses: Potential for falls Goals: Patient will remain injury free Date Initiated: 01/19/2016 Target Resolution Date: 05/25/2016 Goal Status: Active Interventions: Assess fall risk on admission and as needed Assess: immobility, friction, shearing, incontinence upon admission and as needed Assess impairment of mobility on admission and as needed per policy Notes: ` Nutrition Nursing Diagnoses: Imbalanced nutrition Goals: Patient/caregiver agrees to and verbalizes understanding of need to use  nutritional supplements and/or vitamins as prescribed Date Initiated: 01/19/2016 Target Resolution Date: 05/25/2016 Goal Status: Active Interventions: Assess patient nutrition upon admission and as needed per policy Notes: ` Orientation to the [place]. ([id number]) Nursing Diagnoses: Knowledge deficit related to the wound healing center program Goals: Patient/caregiver will verbalize understanding of the [place] Date Initiated: 01/19/2016 Target Resolution Date:  05/25/2016 Goal Status: Active Interventions: Provide education on orientation to the wound center Notes: ` Pain, Acute or Chronic Nursing Diagnoses: Pain, acute or chronic: actual or potential Potential alteration in comfort, pain Goals: Patient will verbalize adequate pain control and receive pain control interventions during procedures as needed Date Initiated: 01/19/2016 Target Resolution Date: 05/25/2016 Goal Status: Active Patient/caregiver will verbalize adequate pain control between visits Date Initiated: 01/19/2016 Target Resolution Date: 05/25/2016 Goal Status: Active Patient/caregiver will verbalize comfort level met Date Initiated: 01/19/2016 Target Resolution Date: 05/25/2016 Goal Status: Active Interventions: Assess comfort goal upon admission Complete pain assessment as per visit requirements Notes: ` Wound/Skin Impairment Nursing Diagnoses: Impaired tissue integrity Knowledge deficit related to ulceration/compromised skin integrity Goals: Natasha [name], Natasha [name] ([id number]) Ulcer/skin breakdown will have a volume reduction of 30% by week 4 Date Initiated: 01/19/2016 Target Resolution Date: 05/25/2016 Goal Status: Active Ulcer/skin breakdown will have a volume reduction of 50% by week 8 Date Initiated: 01/19/2016 Target Resolution Date: 05/25/2016 Goal Status: Active Ulcer/skin breakdown will have a volume reduction of 80% by week 12 Date Initiated: 01/19/2016 Target Resolution Date: 05/25/2016 Goal Status: Active Interventions: Assess patient/caregiver ability to perform ulcer/skin care regimen upon admission and as needed Assess ulceration(s) every visit Notes: Electronic Signature(s) Signed: 03/01/2016 4:49:17 PM By: [name], RN, [name], [name] RN, [name] Entered By: [name], RN, [name], [name] on 03/01/2016 13:04:25 Natasha [name] ([id number]) -------------------------------------------------------------------------------- Pain Assessment Details Patient Name: Natasha [name] Date of Service: 03/01/2016 12:45 PM Medical Record Number: [id number] Patient Account Number: [id number] Date of Birth/Sex: 1928/07/21 (83 y.o. Female) Treating RN: Cornell Barman Primary Care Adrain Nesbit: Emily Filbert Other Clinician: Referring Graceann Boileau: Emily Filbert Treating Dwyane Dupree/Extender: Frann Rider in Treatment: 6 Active Problems Location of Pain Severity and Description of Pain Patient Has Paino No Site Locations With Dressing Change: No Pain Management and Medication Current Pain Management: Electronic Signature(s) Signed: 03/01/2016 4:49:17 PM By: [name], RN, [name], [name] RN, [name] Entered By: [name], RN, [name], [name] on 03/01/2016 12:47:47 Natasha [name] ([id number]) -------------------------------------------------------------------------------- Patient/Caregiver Education Details Patient Name: Natasha [name] Date of Service: 03/01/2016 12:45 PM Medical Record Number: [id number] Patient Account Number: [id number] Date of Birth/Gender: 1928-11-24 (83 y.o. Female) Treating RN: Cornell Barman Primary Care Physician: Emily Filbert Other Clinician: Referring Physician: Emily Filbert Treating Physician/Extender: Frann Rider in Treatment: 6 Education Assessment Education Provided To: Patient Education Topics Provided Wound/Skin Impairment: Handouts: Caring for Your Ulcer, Other: wound care as prescribed Methods: Demonstration Responses: State content correctly Electronic Signature(s) Signed: 03/01/2016 4:49:17 PM By: [name], RN, [name], [name] RN, [name] Entered By: [name], RN, [name], [name] on 03/01/2016 13:24:01 Natasha [name] ([id number]) -------------------------------------------------------------------------------- Wound Assessment Details Patient Name: Natasha [name] Date of Service: 03/01/2016 12:45 PM Medical Record Number: [id number] Patient Account Number: [id number] Date of Birth/Sex: Apr 07, 1928 (83 y.o. Female) Treating RN: Cornell Barman Primary Care Abbigal Radich: Emily Filbert Other Clinician: Referring Dub Maclellan: Emily Filbert Treating Chevis Weisensel/Extender: Frann Rider in Treatment: 6 Wound Status Wound Number: 1 Primary Open Surgical Wound Etiology: Wound Location: Left Upper Leg - Medial Wound Open Wounding Event: Surgical Injury Status: Date Acquired: 12/04/2015  Comorbid Anemia, Asthma, Hypertension, Weeks Of Treatment: 6 History: Peripheral Venous Disease, Clustered Wound: No Osteoarthritis, Neuropathy Photos Wound Measurements Length: (cm) 2 Width: (cm) 0.4 Depth: (cm) 0.9 Area: (cm) 0.628 Volume: (cm) 0.565 % Reduction in Area: 33.3% % Reduction in Volume: 57.2% Epithelialization: Small (1-33%) Tunneling: No Wound Description Full Thickness With Exposed Classification: Support Structures Wound Margin: Epibole Exudate Medium Amount: Exudate Type: Serosanguineous Exudate Color: red, brown Foul Odor After Cleansing: No Slough/Fibrino Yes Wound Bed Granulation Amount: Large (67-100%) Exposed Structure Granulation Quality: Red, Pink Fascia Exposed: No Necrotic Amount: Small (1-33%) Fat Layer (Subcutaneous Tissue) Exposed: Yes Necrotic Quality: Adherent Slough Tendon Exposed: No Muscle Exposed: No Natasha [name], Natasha [name] ([id number]) Joint Exposed: No Bone Exposed: No Periwound Skin Texture Texture Color No Abnormalities Noted: No No Abnormalities Noted: No Scarring: Yes Erythema: No Moisture Temperature / Pain No Abnormalities Noted: No Temperature: No Abnormality Tenderness on Palpation: Yes Wound Preparation Ulcer Cleansing: Rinsed/Irrigated with Saline Topical Anesthetic Applied: Other: lidocaine 4%, Treatment Notes Wound #1 (Left, Medial Upper Leg) 1. Cleansed with: Clean wound with Normal Saline 2. Anesthetic Topical Lidocaine 4% cream to wound bed prior to debridement 4. Dressing Applied: Medihoney Gel Iodoform packing Gauze 5. Secondary Dressing  Applied Bordered Foam Dressing ABD and Kerlix/Conform 7. Secured with Microbiologist) Signed: 03/01/2016 4:49:17 PM By: [name], RN, [name], [name] RN, [name] Entered By: [name], RN, [name], [name] on 03/01/2016 12:58:24 Natasha [name] ([id number]) -------------------------------------------------------------------------------- Wound Assessment Details Patient Name: Natasha [name] Date of Service: 03/01/2016 12:45 PM Medical Record Number: [id number] Patient Account Number: [id number] Date of Birth/Sex: 08/03/1928 (83 y.o. Female) Treating RN: Cornell Barman Primary Care [place] Greb: Emily Filbert Other Clinician: Referring Keilan Nichol: Emily Filbert Treating Senie Lanese/Extender: Frann Rider in Treatment: 6 Wound Status Wound Number: 3 Primary Open Surgical Wound Etiology: Wound Location: Left Lower Leg - Distal Wound Open Wounding Event: Surgical Injury Status: Date Acquired: 12/04/2015 Comorbid Anemia, Asthma, Hypertension, Weeks Of Treatment: 6 History: Peripheral Venous Disease, Clustered Wound: No Osteoarthritis, Neuropathy Photos Wound Measurements Length: (cm) 3.8 Width: (cm) 1.2 Depth: (cm) 0.5 Area: (cm) 3.581 Volume: (cm) 1.791 % Reduction in Area: 79.7% % Reduction in Volume: 85.5% Epithelialization: None Tunneling: No Undermining: No Wound Description Full Thickness With Exposed Classification: Support Structures Wound Margin: Epibole Exudate Large Amount: Exudate Type: Serosanguineous Exudate Color: red, brown Foul Odor After Cleansing: No Slough/Fibrino Yes Wound Bed Granulation Amount: Small (1-33%) Exposed Structure Granulation Quality: Red, Pink, Hyper-granulation Fascia Exposed: No Necrotic Amount: Large (67-100%) Fat Layer (Subcutaneous Tissue) Exposed: Yes Necrotic Quality: Adherent Slough Tendon Exposed: No Muscle Exposed: No Natasha [name], Natasha [name] ([id number]) Joint Exposed: No Bone Exposed: No Periwound Skin  Texture Texture Color No Abnormalities Noted: No No Abnormalities Noted: No Callus: No Atrophie Blanche: No Crepitus: No Cyanosis: No Excoriation: No Ecchymosis: No Induration: No Erythema: No Rash: No Hemosiderin Staining: No Scarring: Yes Mottled: No Pallor: No Moisture Rubor: No No Abnormalities Noted: No Dry / Scaly: No Temperature / Pain Maceration: No Temperature: No Abnormality Tenderness on Palpation: Yes Wound Preparation Ulcer Cleansing: Rinsed/Irrigated with Saline Topical Anesthetic Applied: Other: lidocaine 4%, Treatment Notes Wound #3 (Left, Distal Lower Leg) 1. Cleansed with: Clean wound with Normal Saline 2. Anesthetic Topical Lidocaine 4% cream to wound bed prior to debridement 4. Dressing Applied: Medihoney Gel Iodoform packing Gauze 5. Secondary Dressing Applied Bordered Foam Dressing ABD and Kerlix/Conform 7. Secured with Microbiologist) Signed: 03/01/2016 4:49:17 PM By: [name], RN, [name], [name] RN, [name] Entered By: [name], RN, [name],  [name] on 03/01/2016 12:57:34 Natasha [name], Natasha [name] ([id number]) -------------------------------------------------------------------------------- Vitals Details Patient Name: Natasha [name] Date of Service: 03/01/2016 12:45 PM Medical Record Number: [id number] Patient Account Number: [id number] Date of Birth/Sex: 04/15/1928 (83 y.o. Female) Treating RN: Cornell Barman Primary Care Cohen Doleman: Emily Filbert Other Clinician: Referring Tlaloc Taddei: Emily Filbert Treating Alise Calais/Extender: Frann Rider in Treatment: 6 Vital Signs Time Taken: 12:47 Temperature (F): 98.5 Height (in): 59 Pulse (bpm): 84 Weight (lbs): 140 Respiratory Rate (breaths/min): 16 Body Mass Index (BMI): 28.3 Blood Pressure (mmHg): 118/60 Reference Range: 80 - 120 mg / dl Electronic Signature(s) Signed: 03/01/2016 4:49:17 PM By: [name], RN, [name], [name] RN, [name] Entered By: [name], RN, [name], [name] on 03/01/2016 12:49:30

## 2016-03-08 ENCOUNTER — Encounter: Payer: Medicare Other | Admitting: Surgery

## 2016-03-08 DIAGNOSIS — E11622 Type 2 diabetes mellitus with other skin ulcer: Secondary | ICD-10-CM | POA: Diagnosis not present

## 2016-03-08 NOTE — Progress Notes (Signed)
DOROTHA, HIRSCHI ([id number]) Visit Report for 03/08/2016 Chief Complaint Document Details Patient Name: Natasha [name], Natasha [name] Date of Service: 03/08/2016 11:00 AM Medical Record Number: [id number] Patient Account Number: [id number] Date of Birth/Sex: [birth year] (83 y.o. Female) Treating RN: Montey Hora Primary Care Provider: Emily Filbert Other Clinician: Referring Provider: Emily Filbert Treating Provider/Extender: Frann Rider in Treatment: 7 Information Obtained from: Patient Chief Complaint Patient presents to the wound care center today with an open arterial ulcer along with diabetes mellitus to the left lower extremity which she's had for over a month Electronic Signature(s) Signed: 03/08/2016 11:44:56 AM By: Christin Fudge MD, FACS Entered By: Christin Fudge on 03/08/2016 11:44:56 Natasha [name] ([id number]) -------------------------------------------------------------------------------- Debridement Details Patient Name: Natasha [name] Date of Service: 03/08/2016 11:00 AM Medical Record Number: [id number] Patient Account Number: [id number] Date of Birth/Sex: 12-24-1928 (83 y.o. Female) Treating RN: Montey Hora Primary Care Provider: Emily Filbert Other Clinician: Referring Provider: Emily Filbert Treating Provider/Extender: Frann Rider in Treatment: 7 Debridement Performed for Wound #3 Left,Distal Lower Leg Assessment: Performed By: Physician Christin Fudge, MD Debridement: Debridement Pre-procedure Yes - 11:37 Verification/Time Out Taken: Start Time: 11:37 Pain Control: Lidocaine 4% Topical Solution Level: Skin/Subcutaneous Tissue Total Area Debrided (L x 3.1 (cm) x 1 (cm) = 3.1 (cm) W): Tissue and other Viable, Non-Viable, Fibrin/Slough, Subcutaneous material debrided: Instrument: Curette Bleeding: Minimum Hemostasis Achieved: Pressure End Time: 11:41 Procedural Pain: 0 Post Procedural Pain: 0 Response to Treatment: Procedure was  tolerated well Post Debridement Measurements of Total Wound Length: (cm) 3.1 Width: (cm) 1 Depth: (cm) 0.6 Volume: (cm) 1.461 Character of Wound/Ulcer Post Improved Debridement: Severity of Tissue Post Debridement: Fat layer exposed Post Procedure Diagnosis Same as Pre-procedure Electronic Signature(s) Signed: 03/08/2016 11:44:49 AM By: Christin Fudge MD, FACS Signed: 03/08/2016 3:42:44 PM By: Montey Hora Entered By: Christin Fudge on 03/08/2016 11:44:49 Natasha [name] ([id number], [place] ([id number]) -------------------------------------------------------------------------------- HPI Details Patient Name: Natasha [name] Date of Service: 03/08/2016 11:00 AM Medical Record Number: [id number] Patient Account Number: [id number] Date of Birth/Sex: 1929/01/02 (83 y.o. Female) Treating RN: Montey Hora Primary Care Provider: Emily Filbert Other Clinician: Referring Provider: Emily Filbert Treating Provider/Extender: Frann Rider in Treatment: 7 History of Present Illness Location: left thigh and left calf in the area where there was previous vascular surgery Quality: Patient reports experiencing a dull pain to affected area(s). Severity: Patient states wound (s) are getting better. Duration: Patient has had the wound for > 2 months prior to seeking treatment at the wound center Timing: Pain in wound is Intermittent (comes and goes Context: The wound occurred when the patient had vascular surgery and the wounds were not healing very well Modifying Factors: Other treatment(s) tried include:local care and oral antibiotics Associated Signs and Symptoms: Patient reports having increase swelling. HPI Description: 83 year old female with significant past medical history of anemia, cellulitis and abscess of the leg, COPD, coronary artery disease, hypertension, osteoarthritis, peripheral vascular disease with claudication, trigeminal neuralgia and type 2  diabetes mellitus without complications. A past surgical history significant for appendectomy, left femoral o peroneal bypass graft in October 2017, and debridement of skin and subcutis tissue on the left side in December 2017. She was treated by Dr. Lucky Cowboy earlier, with multiple left leg revascularization both endovascular and open. She is a former smoker who quit 30 years ago. When she was last seen at [facility], at the vascular surgery clinic she had a left lower extremity 3 separate wounds along the saphenectomy site. She was placed  on Bactrim DS for 10 days. She had a good signal over the peroneal artery and the bypass remains patent. Prior to surgery her left ABI was 0.46 and a right was 0.98. Note the patient was seen by Dr. Amalia Hailey of podiatry on 01/15/2016 and he saw for ulceration of her left great toe. Most recently the patient was seen by Dr. Maura Crandall, the vascular surgeon on January 5 and these notes are reviewed on Epic-- he noted that the 3 wounds on the left lower extremity are granulation very slowly, and he recommended packing with wet-to-dry Kerlix. The surgeon noted excellent signal over the peroneal artery and the recommendation was to see as at the wound center to help with healing. 02/12/2016 -- we have not seen her for 2 weeks and her husband tells me that she was admitted to the hospital with a pneumonia. I have reviewed a hospital notes, she was there between January 19 and January 22, with clinical sepsis and bilateral pneumonia, fever and leukocytosis and was treated with vancomycin and Zosyn. Vancomycin was then stopped and Zithromax was added. He was discharged home on Augmentin 02/23/2016 -- she had a arterial duplex study done recently at [facility] and her right ABI was 0.92 and the left ABI was 0.96. The femoral to peroneal bypass is widely patent. she was also seen by the surgeon who noted a postoperative seroma in the left groin. she was asked to return in 3  months for duplex examination 03/01/16 -she has got a lot of lymphedema for left lower extremity and there is weeping from the lower lateral leg. She had been walking around a lot yesterday as she had gone shopping. LANNA, LABELLA ([id number]) 03/08/2016 -- and did used to have a lot of lymphedema for left lower extremity but does not have much pain. Electronic Signature(s) Signed: 03/08/2016 11:45:19 AM By: Christin Fudge MD, FACS Entered By: Christin Fudge on 03/08/2016 11:45:19 Natasha [name] ([id number]) -------------------------------------------------------------------------------- Physical Exam Details Patient Name: Natasha [name] Date of Service: 03/08/2016 11:00 AM Medical Record Number: [id number] Patient Account Number: [id number] Date of Birth/Sex: 07/06/1928 (83 y.o. Female) Treating RN: Montey Hora Primary Care Provider: Emily Filbert Other Clinician: Referring Provider: Emily Filbert Treating Provider/Extender: Frann Rider in Treatment: 7 Constitutional . Pulse regular. Respirations normal and unlabored. Afebrile. . Eyes Nonicteric. Reactive to light. Ears, Nose, Mouth, and Throat Lips, teeth, and gums WNL.Marland Kitchen Moist mucosa without lesions. Neck supple and nontender. No palpable supraclavicular or cervical adenopathy. Normal sized without goiter. Respiratory WNL. No retractions.. Breath sounds WNL, No rubs, rales, rhonchi, or wheeze.. Cardiovascular Heart rhythm and rate regular, no murmur or gallop.. Pedal Pulses WNL. No clubbing, cyanosis or edema. Chest Breasts symmetical and no nipple discharge.. Breast tissue WNL, no masses, lumps, or tenderness.. Lymphatic No adneopathy. No adenopathy. No adenopathy. Musculoskeletal Adexa without tenderness or enlargement.. Digits and nails w/o clubbing, cyanosis, infection, petechiae, ischemia, or inflammatory conditions.. Integumentary (Hair, Skin) No suspicious lesions. No crepitus or fluctuance. No  peri-wound warmth or erythema. No masses.Marland Kitchen Psychiatric Judgement and insight Intact.. No evidence of depression, anxiety, or agitation.. Notes the wound on the left thigh continues to get Tina and minimal depth. The wound on the left calf needed sharp debridement with a #3 curet and minimal bleeding controlled with pressure. Electronic Signature(s) Signed: 03/08/2016 11:45:58 AM By: Christin Fudge MD, FACS Entered By: Christin Fudge on 03/08/2016 11:45:57 Natasha [name] ([id number]) -------------------------------------------------------------------------------- Physician Orders Details Patient Name: Natasha [name] Date of Service:  03/08/2016 11:00 AM Medical Record Number: [id number] Patient Account Number: [id number] Date of Birth/Sex: 1928-03-14 (83 y.o. Female) Treating RN: Montey Hora Primary Care Provider: Emily Filbert Other Clinician: Referring Provider: Emily Filbert Treating Provider/Extender: Frann Rider in Treatment: 7 Verbal / Phone Orders: No Diagnosis Coding Wound Cleansing Wound #1 Left,Medial Upper Leg o Clean wound with Normal Saline. o Cleanse wound with mild soap and water Wound #3 Left,Distal Lower Leg o Clean wound with Normal Saline. o Cleanse wound with mild soap and water Anesthetic Wound #1 Left,Medial Upper Leg o Topical Lidocaine 4% cream applied to wound bed prior to debridement - for clinic use Wound #3 Left,Distal Lower Leg o Topical Lidocaine 4% cream applied to wound bed prior to debridement - for clinic use Skin Barriers/Peri-Wound Care Wound #1 Left,Medial Upper Leg o Skin Prep Wound #3 Left,Distal Lower Leg o Skin Prep Primary Wound Dressing Wound #1 Left,Medial Upper Leg o Iodoform packing Gauze Wound #3 Left,Distal Lower Leg o Medihoney gel Secondary Dressing Wound #1 Left,Medial Upper Leg o Dry Gauze o Boardered Foam Dressing Wound #3 Left,Distal Lower Leg NASIRA, JANUSZ.  ([id number]) o ABD and Kerlix/Conform Dressing Change Frequency Wound #1 Left,Medial Upper Leg o Change dressing every other day. Wound #3 Left,Distal Lower Leg o Change dressing every other day. Follow-up Appointments Wound #1 Left,Medial Upper Leg o Return Appointment in 1 week. Wound #3 Left,Distal Lower Leg o Return Appointment in 1 week. Edema Control Wound #1 Left,Medial Upper Leg o Elevate legs to the level of the heart and pump ankles as often as possible Wound #3 Left,Distal Lower Leg o Elevate legs to the level of the heart and pump ankles as often as possible Additional Orders / Instructions Wound #1 Left,Medial Upper Leg o Increase protein intake. Wound #3 Left,Distal Lower Leg o Increase protein intake. Home Health Wound #1 Left,Medial Upper Leg o Continue Home Health Visits o Home Health Nurse may visit PRN to address patientos wound care needs. o FACE TO FACE ENCOUNTER: MEDICARE and MEDICAID PATIENTS: I certify that this patient is under my care and that I had a face-to-face encounter that meets the physician face-to-face encounter requirements with this patient on this date. The encounter with the patient was in whole or in part for the following MEDICAL CONDITION: (primary reason for [place]) MEDICAL NECESSITY: I certify, that based on my findings, NURSING services are a medically necessary home health service. HOME BOUND STATUS: I certify that my clinical findings support that this patient is homebound (i.e., Due to illness or injury, pt requires aid of supportive devices such as crutches, cane, wheelchairs, walkers, the use of special transportation or the assistance of another person to leave their place of residence. There is a normal inability to leave the home and doing so requires considerable and taxing effort. Other absences are for medical reasons / religious services and are infrequent or of short duration when for other  reasons). AMENAH, TUCCI ([id number]) o If current dressing causes regression in wound condition, may D/C ordered dressing product/s and apply Normal Saline Moist Dressing daily until next [place] / Other MD appointment. Gap of regression in wound condition at [phone]. o Please direct any NON-WOUND related issues/requests for orders to patient's Primary Care Physician Wound #3 Left,Distal Lower Leg o [place] Nurse may visit PRN to address patientos wound care needs. o FACE TO FACE ENCOUNTER: MEDICARE and MEDICAID PATIENTS: I certify that this patient is  under my care and that I had a face-to-face encounter that meets the physician face-to-face encounter requirements with this patient on this date. The encounter with the patient was in whole or in part for the following MEDICAL CONDITION: (primary reason for [place]) MEDICAL NECESSITY: I certify, that based on my findings, NURSING services are a medically necessary home health service. HOME BOUND STATUS: I certify that my clinical findings support that this patient is homebound (i.e., Due to illness or injury, pt requires aid of supportive devices such as crutches, cane, wheelchairs, walkers, the use of special transportation or the assistance of another person to leave their place of residence. There is a normal inability to leave the home and doing so requires considerable and taxing effort. Other absences are for medical reasons / religious services and are infrequent or of short duration when for other reasons). o If current dressing causes regression in wound condition, may D/C ordered dressing product/s and apply Normal Saline Moist Dressing daily until next [place] / Other MD appointment. [place] of regression in wound condition at [phone]. o Please direct any NON-WOUND related issues/requests  for orders to patient's Primary Care Physician Electronic Signature(s) Signed: 03/08/2016 2:09:34 PM By: Christin Fudge MD, FACS Signed: 03/08/2016 3:42:44 PM By: Montey Hora Entered By: Montey Hora on 03/08/2016 11:42:03 Natasha [name] ([id number]) -------------------------------------------------------------------------------- Problem List Details Patient Name: Natasha [name] Date of Service: 03/08/2016 11:00 AM Medical Record Number: [id number] Patient Account Number: [id number] Date of Birth/Sex: Sep 10, 1928 (83 y.o. 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 Female) Treating RN: Montey Hora Primary Care Provider: Emily Filbert Other Clinician: Referring Provider: Emily Filbert Treating Provider/Extender: Frann Rider in Treatment: 7 Subjective Chief Complaint Information obtained from Patient Patient presents to the wound care center today with  an open arterial ulcer along with diabetes mellitus to the left lower extremity which she's had for over a month History of Present Illness (HPI) The following HPI elements were documented for the patient's wound: Location: left thigh and left calf in the area where there was previous vascular surgery Quality: Patient reports experiencing a dull pain to affected area(s). Severity: Patient states wound (s) are getting better. Duration: Patient has had the wound for > 2 months prior to seeking treatment at the wound center Timing: Pain in wound is Intermittent (comes and goes Context: The wound occurred when the patient had vascular surgery and the wounds were not healing very well Modifying Factors: Other treatment(s) tried include:local care and oral antibiotics Associated Signs and Symptoms: Patient reports having increase swelling. 83 year old female with significant past medical history of anemia, cellulitis and abscess of the leg, COPD, coronary artery disease, hypertension, osteoarthritis, peripheral vascular disease with claudication, trigeminal neuralgia and  type 2 diabetes mellitus without complications. A past surgical history significant for appendectomy, left femoral peroneal bypass graft in October 2017, and debridement of skin and subcutis tissue on the left side in December 2017. She was treated by Dr. Lucky Cowboy earlier, with multiple left leg revascularization both endovascular and open. She is a former smoker who quit 30 years ago. When she was last seen at [facility], at the vascular surgery clinic she had a left lower extremity 3 separate wounds along the saphenectomy site. She was placed on Bactrim DS for 10 days. She had a good signal over the peroneal artery and the bypass remains patent. Prior to surgery her left ABI was 0.46 and a right was 0.98. Note the patient was seen by Dr. Amalia Hailey of podiatry on 01/15/2016 and he saw for ulceration of her left great toe. Most recently the  patient was seen by Dr. Maura Crandall, the vascular surgeon on January 5 and these notes are reviewed on Epic-- he noted that the 3 wounds on the left lower extremity are granulation very slowly, and he recommended packing with wet-to-dry Kerlix. The surgeon noted excellent signal over the peroneal artery and the recommendation was to see as at the wound center to help with healing. 02/12/2016 -- we have not seen her for 2 weeks and her husband tells me that she was admitted to the hospital with a pneumonia. I have reviewed a hospital notes, she was there between January 19 and January 22, with clinical sepsis and bilateral pneumonia, fever and leukocytosis and was treated with MARYKATE, HEUBERGER. ([id number]) vancomycin and Zosyn. Vancomycin was then stopped and Zithromax was added. He was discharged home on Augmentin 02/23/2016 -- she had a arterial duplex study done recently at [facility] and her right ABI was 0.92 and the left ABI was 0.96. The femoral to peroneal bypass is widely patent. she was also seen by the surgeon who noted a postoperative seroma in the left groin. she was asked to return in 3 months for duplex examination 03/01/16 -she has got a lot of lymphedema for left lower extremity and there is weeping from the lower lateral leg. She had been walking around a lot yesterday as she had gone shopping. 03/08/2016 -- and did used to have a lot of lymphedema for left lower extremity but does not have much pain. Objective Constitutional Pulse regular. Respirations normal and unlabored. Afebrile. Vitals Time Taken: 11:17 AM, Height: 59 in, Weight: 140 lbs, BMI: 28.3, Temperature: 97.9 F, Pulse: 74 bpm, Respiratory Rate: 16 breaths/min, Blood Pressure: 138/47 mmHg. Eyes Nonicteric. Reactive to light. Ears, Nose, Mouth, and Throat Lips, teeth, and gums WNL.Marland Kitchen Moist mucosa without lesions. Neck supple and nontender. No palpable supraclavicular or cervical adenopathy. Normal  sized without goiter. Respiratory WNL. No retractions.. Breath sounds WNL, No rubs, rales, rhonchi, or wheeze.. Cardiovascular Heart rhythm and rate regular, no murmur or gallop.. Pedal Pulses WNL. No clubbing, cyanosis or edema. Chest Breasts symmetical and no nipple discharge.. Breast tissue WNL, no masses, lumps, or tenderness.. Lymphatic No adneopathy. No adenopathy. No adenopathy. Musculoskeletal Adexa without tenderness or enlargement.. Digits and nails w/o clubbing, cyanosis, infection, petechiae, IRIDIANA, FONNER. ([id number]) ischemia, or inflammatory conditions.Marland Kitchen Psychiatric Judgement and insight Intact.. No evidence of depression, anxiety, or agitation.. General Notes: the wound on the left thigh continues to get Tina and minimal depth. The wound on the left calf needed sharp debridement with a #3 curet and minimal bleeding controlled with pressure. Integumentary (Hair, Skin) No suspicious lesions.  No crepitus or fluctuance. No peri-wound warmth or erythema. No masses.. Wound #1 status is Open. Original cause of wound was Surgical Injury. The wound is located on the Left,Medial Upper Leg. The wound measures 1.8cm length x 0.4cm width x 0.9cm depth; 0.565cm^2 area and 0.509cm^3 volume. There is Fat Layer (Subcutaneous Tissue) Exposed exposed. There is no tunneling or undermining noted. There is a medium amount of serosanguineous drainage noted. The wound margin is epibole. There is large (67-100%) red, pink granulation within the wound bed. There is a small (1-33%) amount of necrotic tissue within the wound bed including Adherent Slough. The periwound skin appearance exhibited: Scarring. The periwound skin appearance did not exhibit: Erythema. Periwound temperature was noted as No Abnormality. The periwound has tenderness on palpation. Wound #3 status is Open. Original cause of wound was Surgical Injury. The wound is located on the Left,Distal Lower Leg. The wound measures  3.1cm length x 1cm width x 0.5cm depth; 2.435cm^2 area and 1.217cm^3 volume. There is Fat Layer (Subcutaneous Tissue) Exposed exposed. There is no tunneling or undermining noted. There is a large amount of serosanguineous drainage noted. The wound margin is epibole. There is medium (34-66%) red, pink granulation within the wound bed. There is a medium (34- 66%) amount of necrotic tissue within the wound bed including Adherent Slough. The periwound skin appearance exhibited: Scarring. The periwound skin appearance did not exhibit: Callus, Crepitus, Excoriation, Induration, Rash, Dry/Scaly, Maceration, Atrophie Blanche, Cyanosis, Ecchymosis, Hemosiderin Staining, Mottled, Pallor, Rubor, Erythema. Periwound temperature was noted as No Abnormality. The periwound has tenderness on palpation. 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 There was a Skin/Subcutaneous Tissue Debridement ([id number]) debridement with total area of 3.1 sq cm performed by Christin Fudge, MD. with the following instrument(s): Curette to remove Viable and Non-Viable tissue/material including Fibrin/Slough and Subcutaneous after achieving pain control using Lidocaine 4% Topical Solution. A time out was conducted at 11:37, prior to the start of the procedure. A Minimum amount of bleeding was controlled with Pressure. The procedure was tolerated well with a pain level of 0 throughout and a pain level of 0 following the procedure. Post Debridement Measurements: 3.1cm length x 1cm width x 0.6cm depth; 1.461cm^3 volume. Character of  Wound/Ulcer Post Debridement is improved. Severity of Tissue Post Debridement is: Fat layer exposed. Post procedure Diagnosis Wound #3: Same as Pre-Procedure Plan Wound Cleansing: Wound #1 Left,Medial Upper Leg: Clean wound with Normal Saline. Cleanse wound with mild soap and water Wound #3 Left,Distal Lower Leg: Clean wound with Normal Saline. Cleanse wound with mild soap and water Anesthetic: Wound #1 Left,Medial Upper Leg: Topical Lidocaine 4% cream applied to wound bed prior to debridement - for clinic use Wound #3 Left,Distal Lower Leg: Topical Lidocaine 4% cream applied to wound bed prior to debridement - for clinic use Skin Barriers/Peri-Wound Care: Wound #1 Left,Medial Upper Leg: Skin Prep Wound #3 Left,Distal Lower Leg: Skin Prep Primary Wound Dressing: Wound #1 Left,Medial Upper Leg: Iodoform packing Gauze Wound #3 Left,Distal Lower Leg: Medihoney gel Secondary Dressing: Wound #1 Left,Medial Upper Leg: Dry Gauze Boardered Foam Dressing Wound #3 Left,Distal Lower Leg: SHAQUILA, SIGMAN ([id number]) ABD and [place]/Conform  Dressing Change Frequency: Wound #1 Left,Medial Upper Leg: Change dressing every other day. Wound #3 Left,Distal Lower Leg: Change dressing every other day. Follow-up Appointments: Wound #1 Left,Medial Upper Leg: Return Appointment in 1 week. Wound #3 Left,Distal Lower Leg: Return Appointment in 1 week. Edema Control: Wound #1 Left,Medial Upper Leg: Elevate legs to the level of the heart and pump ankles as often as possible Wound #3 Left,Distal Lower Leg: Elevate legs to the level of the heart and pump ankles as often as possible Additional Orders / Instructions: Wound #1 Left,Medial Upper Leg: Increase protein intake. Wound #3 Left,Distal Lower Leg: Increase protein intake. Home Health: Wound #1 Left,Medial Upper Leg: [place] Nurse may visit PRN to address patient s wound care needs. FACE TO FACE  ENCOUNTER: MEDICARE and MEDICAID PATIENTS: I certify that this patient is under my care and that I had a face-to-face encounter that meets the physician face-to-face encounter requirements with this patient on this date. The encounter with the patient was in whole or in part for the following MEDICAL CONDITION: (primary reason for [place]) MEDICAL NECESSITY: I certify, that based on my findings, NURSING services are a medically necessary home health service. HOME BOUND STATUS: I certify that my clinical findings support that this patient is homebound (i.e., Due to illness or injury, pt requires aid of supportive devices such as crutches, cane, wheelchairs, walkers, the use of special transportation or the assistance of another person to leave their place of residence. There is a normal inability to leave the home and doing so requires considerable and taxing effort. Other absences are for medical reasons / religious services and are infrequent or of short duration when for other reasons). If current dressing causes regression in wound condition, may D/C ordered dressing product/s and apply Normal Saline Moist Dressing daily until next [place] / Other MD appointment. [place] of regression in wound condition at [phone]. Please direct any NON-WOUND related issues/requests for orders to patient's Primary Care Physician Wound #3 Left,Distal Lower Leg: [place] Nurse may visit PRN to address patient s wound care needs. FACE TO FACE ENCOUNTER: MEDICARE and MEDICAID PATIENTS: I certify that this patient is under my care and that I had a face-to-face encounter that meets the physician face-to-face encounter requirements with this patient on this date. The encounter with the patient was in whole or in part for the following MEDICAL CONDITION: (primary reason for [place]) MEDICAL NECESSITY: I certify, that based on my  findings, NURSING services are a medically necessary home health service. HOME BOUND STATUS: I certify that my clinical findings support that this patient is homebound (i.e., Due to illness or injury, pt requires aid of supportive devices such as crutches, cane, wheelchairs, walkers, the use of special transportation or the assistance of another person to leave their place of residence. There is a normal inability to leave the home and doing so requires considerable and taxing effort. Other absences are ALYDA, MEGNA ([phone]) for medical reasons / religious services and are infrequent or of short duration when for other reasons). If current dressing causes regression in wound condition, may D/C ordered dressing product/s and apply Normal Saline Moist Dressing daily until next [place] / Other MD appointment. [place] of regression in wound condition at [phone]. Please direct any NON-WOUND related issues/requests for orders to patient's Primary Care Physician I have cautioned her about walking around too much  and have recommended elevation and exercise. At a later date we will get her some light compression stockings to help with the lymphedema I have recommended: 1. packing of the thigh wound with 1/4 inch iodoform gauze and an appropriate foam border. 2. the wound lower down will be covered with Medihoney and an appropriate bordered foam 3. Good amount of protein intake, vitamin A, vitamin C and zinc 4. Regular visits to the wound center Electronic Signature(s) Signed: 03/08/2016 11:47:01 AM By: Christin Fudge MD, FACS Entered By: Christin Fudge on 03/08/2016 11:47:01 Natasha [name] ([id number]) -------------------------------------------------------------------------------- SuperBill Details Patient Name: Natasha [name] Date of Service: 03/08/2016 Medical Record Number: [id number] Patient Account Number: [id number] Date of Birth/Sex:  [birth year] (83 y.o. 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 exposed I89.0 Lymphedema, not elsewhere classified Facility Procedures CPT4 Code Description: [id number] - DEB SUBQ TISSUE 20 SQ CM/< ICD-10 Description Diagnosis I70.242 Atherosclerosis 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 L97.222 Non-pressure chronic ulcer of left calf with fat layer e L97.122 Non-pressure chronic  ulcer of left thigh with fat layer I89.0 Lymphedema, not elsewhere classified Modifier: ration of ca xposed exposed Quantity: 1 lf Physician Procedures CPT4 Code Description: [id number] - WC PHYS SUBQ TISS 20 SQ CM ICD-10 Description Diagnosis I70.242 Atherosclerosis 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 L97.222 Non-pressure chronic ulcer of left calf with fat layer e L97.122 Non-pressure chronic  ulcer of left thigh with fat layer I89.0 Lymphedema, not elsewhere classified Modifier: ration of ca xposed exposed Quantity: 1 lf Electronic Signature(s) Signed: 03/08/2016 11:47:22 AM By: Christin Fudge MD, FACS Entered By: Christin Fudge on 03/08/2016 11:47:22

## 2016-03-08 NOTE — Progress Notes (Signed)
Natasha [name], Natasha [name] ([id number]) Visit Report for 03/08/2016 Arrival Information Details Patient Name: Natasha [name], Natasha [name] Date of Service: 03/08/2016 11:00 AM Medical Record Number: [id number] Patient Account Number: [id number] Date of Birth/Sex: July 26, 1928 (83 y.o. Female) Treating RN: Carolyne Fiscal, Debi Primary Care Saloma Cadena: Emily Filbert Other Clinician: Referring Bartosz Luginbill: Emily Filbert Treating Mikaelah Trostle/Extender: Frann Rider in Treatment: 7 Visit Information History Since Last Visit All ordered tests and consults were completed: No Patient Arrived: [name] Added or deleted any medications: No Arrival Time: 11:15 Any new allergies or adverse reactions: No Accompanied By: husband Had a fall or experienced change in No Transfer Assistance: EasyPivot Patient activities of daily living that may affect Lift risk of falls: Patient Identification Verified: Yes Signs or symptoms of abuse/neglect since last No Secondary Verification Process Yes visito Completed: Hospitalized since last visit: No Patient Requires Transmission- No Has Dressing in Place as Prescribed: Yes Based Precautions: Pain Present Now: Yes Patient Has Alerts: Yes Patient Alerts: Patient on Blood Thinner Plavix Electronic Signature(s) Signed: 03/08/2016 3:35:19 PM By: Alric Quan Entered By: Alric Quan on 03/08/2016 11:16:28 Natasha [name] ([id number]) -------------------------------------------------------------------------------- Encounter Discharge Information Details Patient Name: Natasha [name] Date of Service: 03/08/2016 11:00 AM Medical Record Number: [id number] Patient Account Number: [id number] Date of Birth/Sex: [birth year] (83 y.o. Female) Treating RN: Montey Hora Primary Care Braileigh Landenberger: Emily Filbert Other Clinician: Referring Chardonay Scritch[place]: Emily Filbert Treating Roxine Whittinghill/Extender: Frann Rider in Treatment: 7 Encounter Discharge Information Items Discharge  Pain Level: 0 Discharge Condition: Stable Ambulatory Status: Walker Discharge Destination: Home Transportation: Private Auto Accompanied By: spouse Schedule Follow-up Appointment: Yes Medication Reconciliation completed No and provided to Patient/Care Cledith Abdou: Provided on Clinical Summary of Care: 03/08/2016 Form Type Recipient Paper Patient [facility] Electronic Signature(s) Signed: 03/08/2016 11:54:05 AM By: Ruthine Dose Entered By: Ruthine Dose on 03/08/2016 11:54:05 Natasha [name] ([id number]) -------------------------------------------------------------------------------- Lower Extremity Assessment Details Patient Name: Natasha [name] Date of Service: 03/08/2016 11:00 AM Medical Record Number: [id number] Patient Account Number: [id number] Date of Birth/Sex: November 07, 1928 (83 y.o. Female) Treating RN: Carolyne Fiscal, Debi Primary Care Chayna Surratt: Emily Filbert Other Clinician: Referring Aloysius Heinle: Emily Filbert Treating Surah Pelley/Extender: Frann Rider in Treatment: 7 Edema Assessment Assessed: [Left: No] [Right: No] Edema: [Left: Ye] [Right: s] Calf Left: Right: Point of Measurement: 28 cm From Medial Instep 35.3 cm cm Ankle Left: Right: Point of Measurement: 10 cm From Medial Instep 24 cm cm Vascular Assessment Pulses: Dorsalis Pedis Palpable: [Left:Yes] Posterior Tibial Extremity colors, hair growth, and conditions: Extremity Color: [Left:Red] Hair Growth on Extremity: [Left:No] Temperature of Extremity: [Left:Warm] Capillary Refill: [Left:< 3 seconds] Electronic Signature(s) Signed: 03/08/2016 3:35:19 PM By: Alric Quan Entered By: Alric Quan on 03/08/2016 11:25:37 Natasha [name] ([id number]) -------------------------------------------------------------------------------- Multi Wound Chart Details Patient Name: Natasha [name] Date of Service: 03/08/2016 11:00 AM Medical Record Number: [id number] Patient Account Number:  [id number] Date of Birth/Sex: Dec 22, 1928 (83 y.o. Female) Treating RN: Montey Hora Primary Care Rayn Shorb: Emily Filbert Other Clinician: Referring Denya Buckingham: Emily Filbert Treating Navayah Sok/Extender: Frann Rider in Treatment: 7 Vital Signs Height(in): 59 Pulse(bpm): 74 Weight(lbs): 140 Blood Pressure 138/47 (mmHg): Body Mass Index(BMI): 28 Temperature(F): 97.9 Respiratory Rate 16 (breaths/min): Photos: [1:No Photos] [3:No Photos] [N/A:N/A] Wound Location: [1:Left Upper Leg - Medial Left Lower Leg - Distal] [N/A:N/A] Wounding Event: [1:Surgical Injury] [3:Surgical Injury] [N/A:N/A] Primary Etiology: [1:Open Surgical Wound] [3:Open Surgical Wound] [N/A:N/A] Comorbid History: [1:Anemia, Asthma, Hypertension, Peripheral Hypertension, Peripheral Venous Disease, Osteoarthritis, Neuropathy Osteoarthritis, Neuropathy] [3:Anemia, Asthma, Venous Disease,] [N/A:N/A] Date Acquired: [1:12/04/2015] [3:12/04/2015] [N/A:N/A] Weeks of Treatment: [1:7] [  3:7] [N/A:N/A] Wound Status: [1:Open] [3:Open] [N/A:N/A] Measurements L x W x D 1.8x0.4x0.9 [3:3.1x1x0.5] [N/A:N/A] (cm) Area (cm) : [1:0.565] [3:2.435] [N/A:N/A] Volume (cm) : [1:0.509] [3:1.217] [N/A:N/A] % Reduction in Area: [1:40.00%] [3:86.20%] [N/A:N/A] % Reduction in Volume: 61.40% [3:90.20%] [N/A:N/A] Classification: [1:Full Thickness With Exposed Support Structures] [3:Full Thickness With Exposed Support Structures] [N/A:N/A] Exudate Amount: [1:Medium] [3:Large] [N/A:N/A] Exudate Type: [1:Serosanguineous] [3:Serosanguineous] [N/A:N/A] Exudate Color: [1:red, brown] [3:red, brown] [N/A:N/A] Wound Margin: [1:Epibole] [3:Epibole] [N/A:N/A] Granulation Amount: [1:Large (67-100%)] [3:Medium (34-66%)] [N/A:N/A] Granulation Quality: [1:Red, Pink] [3:Red, Pink, Hyper- granulation] [N/A:N/A] Necrotic Amount: [1:Small (1-33%)] [3:Medium (34-66%)] [N/A:N/A] Exposed Structures: [1:Fat Layer (Subcutaneous Fat Layer (Subcutaneous N/A  Tissue) Exposed: Yes] [3:Tissue) Exposed: Yes] Fascia: No Fascia: No Tendon: No Tendon: No Muscle: No Muscle: No Joint: No Joint: No Bone: No Bone: No Epithelialization: Small (1-33%) None N/A Debridement: N/A Debridement (22025- N/A 11047) Pre-procedure N/A 11:37 N/A Verification/Time Out Taken: Pain Control: N/A Lidocaine 4% Topical N/A Solution Tissue Debrided: N/A Fibrin/Slough, N/A Subcutaneous Level: N/A Skin/Subcutaneous N/A Tissue Debridement Area (sq N/A 3.1 N/A cm): Instrument: N/A Curette N/A Bleeding: N/A Minimum N/A Hemostasis Achieved: N/A Pressure N/A Procedural Pain: N/A 0 N/A Post Procedural Pain: N/A 0 N/A Debridement Treatment N/A Procedure was tolerated N/A Response: well Post Debridement N/A 3.1x1x0.6 N/A Measurements L x W x D (cm) Post Debridement N/A 1.461 N/A Volume: (cm) Periwound Skin Texture: Scarring: Yes Scarring: Yes N/A Excoriation: No Induration: No Callus: No Crepitus: No Rash: No Periwound Skin No Abnormalities Noted Maceration: No N/A Moisture: Dry/Scaly: No Periwound Skin Color: Erythema: No Atrophie Blanche: No N/A Cyanosis: No Ecchymosis: No Erythema: No Hemosiderin Staining: No Mottled: No Pallor: No Rubor: No Temperature: No Abnormality No Abnormality N/A Tenderness on Yes Yes N/A Palpation: Natasha [name], Natasha [name] ([id number]) Wound Preparation: Ulcer Cleansing: Ulcer Cleansing: N/A Rinsed/Irrigated with Rinsed/Irrigated with Saline Saline Topical Anesthetic Topical Anesthetic Applied: Other: lidocaine Applied: Other: lidocaine 4% 4% Procedures Performed: N/A Debridement N/A Treatment Notes Electronic Signature(s) Signed: 03/08/2016 11:44:40 AM By: Christin Fudge MD, FACS Entered By: Christin Fudge on 03/08/2016 11:44:40 Natasha [name] ([id number]) -------------------------------------------------------------------------------- [place] Details Patient Name: Natasha [name] Date of Service: 03/08/2016 11:00 AM Medical Record Number: [id number] Patient Account Number: [id number] Date of Birth/Sex: [birth year] (83 y.o. Female) Treating RN: Montey Hora Primary Care Marquinn Meschke: Emily Filbert Other Clinician: Referring Miklo Aken: Emily Filbert Treating Tearra Ouk/Extender: Frann Rider in Treatment: 7 Active Inactive ` Abuse / Safety / Falls / Self Care Management Nursing Diagnoses: Potential for falls Goals: Patient will remain injury free Date Initiated: 01/19/2016 Target Resolution Date: 05/25/2016 Goal Status: Active Interventions: Assess fall risk on admission and as needed Assess: immobility, friction, shearing, incontinence upon admission and as needed Assess impairment of mobility on admission and as needed per policy Notes: ` Nutrition Nursing Diagnoses: Imbalanced nutrition Goals: Patient/caregiver agrees to and verbalizes understanding of need to use nutritional supplements and/or vitamins as prescribed Date Initiated: 01/19/2016 Target Resolution Date: 05/25/2016 Goal Status: Active Interventions: Assess patient nutrition upon admission and as needed per policy Notes: ` Orientation to the [place], [place]. ([id number]) Nursing Diagnoses: Knowledge deficit related to the wound healing center program Goals: Patient/caregiver will verbalize understanding of the [place] Date Initiated: 01/19/2016 Target Resolution Date: 05/25/2016 Goal Status: Active Interventions: Provide education on orientation to the wound center Notes: ` Pain, Acute or Chronic Nursing Diagnoses: Pain, acute or chronic: actual or potential Potential alteration in comfort, pain Goals: Patient will verbalize adequate pain control  and receive pain control interventions during procedures as needed Date Initiated: 01/19/2016 Target Resolution Date: 05/25/2016 Goal Status: Active Patient/caregiver will verbalize adequate  pain control between visits Date Initiated: 01/19/2016 Target Resolution Date: 05/25/2016 Goal Status: Active Patient/caregiver will verbalize comfort level met Date Initiated: 01/19/2016 Target Resolution Date: 05/25/2016 Goal Status: Active Interventions: Assess comfort goal upon admission Complete pain assessment as per visit requirements Notes: ` Wound/Skin Impairment Nursing Diagnoses: Impaired tissue integrity Knowledge deficit related to ulceration/compromised skin integrity Goals: Natasha [name], Natasha [name] ([id number]) Ulcer/skin breakdown will have a volume reduction of 30% by week 4 Date Initiated: 01/19/2016 Target Resolution Date: 05/25/2016 Goal Status: Active Ulcer/skin breakdown will have a volume reduction of 50% by week 8 Date Initiated: 01/19/2016 Target Resolution Date: 05/25/2016 Goal Status: Active Ulcer/skin breakdown will have a volume reduction of 80% by week 12 Date Initiated: 01/19/2016 Target Resolution Date: 05/25/2016 Goal Status: Active Interventions: Assess patient/caregiver ability to perform ulcer/skin care regimen upon admission and as needed Assess ulceration(s) every visit Notes: Electronic Signature(s) Signed: 03/08/2016 3:42:44 PM By: Montey Hora Entered By: Montey Hora on 03/08/2016 11:35:41 Natasha [name] ([id number]) -------------------------------------------------------------------------------- Pain Assessment Details Patient Name: Natasha [name] Date of Service: 03/08/2016 11:00 AM Medical Record Number: [id number] Patient Account Number: [id number] Date of Birth/Sex: [birth year] (83 y.o. Female) Treating RN: Carolyne Fiscal, Debi Primary Care Maleiah Dula: Emily Filbert Other Clinician: Referring Anselmo Reihl: Emily Filbert Treating Keyen Marban/Extender: Frann Rider in Treatment: 7 Active Problems Location of Pain Severity and Description of Pain Patient Has Paino Yes Site Locations Pain Location: Pain in Ulcers With Dressing  Change: Yes Rate the pain. Current Pain Level: 4 Character of Pain Describe the Pain: Aching Pain Management and Medication Current Pain Management: Electronic Signature(s) Signed: 03/08/2016 3:35:19 PM By: Alric Quan Entered By: Alric Quan on 03/08/2016 11:16:49 Natasha [name] ([id number]) -------------------------------------------------------------------------------- Patient/Caregiver Education Details Patient Name: Natasha [name] Date of Service: 03/08/2016 11:00 AM Medical Record Number: [id number] Patient Account Number: [id number] Date of Birth/Gender: 07/22/1928 (83 y.o. Female) Treating RN: Montey Hora Primary Care Physician: Emily Filbert Other Clinician: Referring Physician: Emily Filbert Treating Physician/Extender: Frann Rider in Treatment: 7 Education Assessment Education Provided To: Patient and Caregiver Education Topics Provided Wound/Skin Impairment: Handouts: Other: wound care as ordered Methods: Demonstration, Explain/Verbal Responses: State content correctly Electronic Signature(s) Signed: 03/08/2016 3:42:44 PM By: Montey Hora Entered By: Montey Hora on 03/08/2016 11:37:22 Natasha [name] ([id number]) -------------------------------------------------------------------------------- Wound Assessment Details Patient Name: Natasha [name] Date of Service: 03/08/2016 11:00 AM Medical Record Number: [id number] Patient Account Number: [id number] Date of Birth/Sex: Jun 03, 1928 (83 y.o. Female) Treating RN: Carolyne Fiscal, Debi Primary Care Emmi Wertheim: Emily Filbert Other Clinician: Referring Satchel Heidinger: Emily Filbert Treating Marzell Allemand/Extender: Frann Rider in Treatment: 7 Wound Status Wound Number: 1 Primary Open Surgical Wound Etiology: Wound Location: Left Upper Leg - Medial Wound Open Wounding Event: Surgical Injury Status: Date Acquired: 12/04/2015 Comorbid Anemia, Asthma, Hypertension, Weeks Of  Treatment: 7 History: Peripheral Venous Disease, Clustered Wound: No Osteoarthritis, Neuropathy Photos Photo Uploaded By: Alric Quan on 03/08/2016 13:18:51 Wound Measurements Length: (cm) 1.8 Width: (cm) 0.4 Depth: (cm) 0.9 Area: (cm) 0.565 Volume: (cm) 0.509 % Reduction in Area: 40% % Reduction in Volume: 61.4% Epithelialization: Small (1-33%) Tunneling: No Undermining: No Wound Description Full Thickness With Exposed Classification: Support Structures Wound Margin: Epibole Exudate Medium Amount: Exudate Type: Serosanguineous Exudate Color: red, brown Foul Odor After Cleansing: No Slough/Fibrino Yes Wound Bed Granulation Amount: Large (67-100%) Exposed Structure Granulation Quality: Red, Pink Fascia Exposed: No Natasha [name], Natasha J. (  [id number]) Necrotic Amount: Small (1-33%) Fat Layer (Subcutaneous Tissue) Exposed: Yes Necrotic Quality: Adherent Slough Tendon Exposed: No Muscle Exposed: No Joint Exposed: No Bone Exposed: No Periwound Skin Texture Texture Color No Abnormalities Noted: No No Abnormalities Noted: No Scarring: Yes Erythema: No Moisture Temperature / Pain No Abnormalities Noted: No Temperature: No Abnormality Tenderness on Palpation: Yes Wound Preparation Ulcer Cleansing: Rinsed/Irrigated with Saline Topical Anesthetic Applied: Other: lidocaine 4%, Treatment Notes Wound #1 (Left, Medial Upper Leg) 1. Cleansed with: Clean wound with Normal Saline 2. Anesthetic Topical Lidocaine 4% cream to wound bed prior to debridement 4. Dressing Applied: Iodoform packing Gauze 5. Secondary Dressing Applied Bordered Foam Dressing Dry Gauze Electronic Signature(s) Signed: 03/08/2016 3:35:19 PM By: Alric Quan Entered By: Alric Quan on 03/08/2016 11:23:53 Natasha [name] ([id number]) -------------------------------------------------------------------------------- Wound Assessment Details Patient Name: Natasha [name] Date of Service: 03/08/2016 11:00 AM Medical Record Number: [id number] Patient Account Number: [id number] Date of Birth/Sex: 03/15/1928 (83 y.o. Female) Treating RN: Carolyne Fiscal, Debi Primary Care Norell Brisbin: Emily Filbert Other Clinician: Referring Marqueze Ramcharan: Emily Filbert Treating Tomiko Schoon/Extender: Frann Rider in Treatment: 7 Wound Status Wound Number: 3 Primary Open Surgical Wound Etiology: Wound Location: Left Lower Leg - Distal Wound Open Wounding Event: Surgical Injury Status: Date Acquired: 12/04/2015 Comorbid Anemia, Asthma, Hypertension, Weeks Of Treatment: 7 History: Peripheral Venous Disease, Clustered Wound: No Osteoarthritis, Neuropathy Photos Photo Uploaded By: Alric Quan on 03/08/2016 13:19:14 Wound Measurements Length: (cm) 3.1 Width: (cm) 1 Depth: (cm) 0.5 Area: (cm) 2.435 Volume: (cm) 1.217 % Reduction in Area: 86.2% % Reduction in Volume: 90.2% Epithelialization: None Tunneling: No Undermining: No Wound Description Full Thickness With Exposed Classification: Support Structures Wound Margin: Epibole Exudate Large Amount: Exudate Type: Serosanguineous Exudate Color: red, brown Foul Odor After Cleansing: No Slough/Fibrino Yes Wound Bed Granulation Amount: Medium (34-66%) Exposed Structure Granulation Quality: Red, Pink, Hyper-granulation Fascia Exposed: No Natasha [name], Natasha [name] ([id number]) Necrotic Amount: Medium (34-66%) Fat Layer (Subcutaneous Tissue) Exposed: Yes Necrotic Quality: Adherent Slough Tendon Exposed: No Muscle Exposed: No Joint Exposed: No Bone Exposed: No Periwound Skin Texture Texture Color No Abnormalities Noted: No No Abnormalities Noted: No Callus: No Atrophie Blanche: No Crepitus: No Cyanosis: No Excoriation: No Ecchymosis: No Induration: No Erythema: No Rash: No Hemosiderin Staining: No Scarring: Yes Mottled: No Pallor: No Moisture Rubor: No No Abnormalities Noted: No Dry / Scaly: No  Temperature / Pain Maceration: No Temperature: No Abnormality Tenderness on Palpation: Yes Wound Preparation Ulcer Cleansing: Rinsed/Irrigated with Saline Topical Anesthetic Applied: Other: lidocaine 4%, Treatment Notes Wound #3 (Left, Distal Lower Leg) 1. Cleansed with: Clean wound with Normal Saline 2. Anesthetic Topical Lidocaine 4% cream to wound bed prior to debridement 4. Dressing Applied: Medihoney Gel 5. Secondary Dressing Applied Guaze, ABD and kerlix/Conform 7. Secured with Recruitment consultant) Signed: 03/08/2016 3:35:19 PM By: Alric Quan Entered By: Alric Quan on 03/08/2016 11:24:07 Natasha [name] ([id number]) -------------------------------------------------------------------------------- Vitals Details Patient Name: Natasha [name] Date of Service: 03/08/2016 11:00 AM Medical Record Number: [id number] Patient Account Number: [id number] Date of Birth/Sex: 12/28/1928 (83 y.o. Female) Treating RN: Carolyne Fiscal, Debi Primary Care Vonnetta Akey: Emily Filbert Other Clinician: Referring Yomira Flitton: Emily Filbert Treating Teckla Christiansen/Extender: Frann Rider in Treatment: 7 Vital Signs Time Taken: 11:17 Temperature (F): 97.9 Height (in): 59 Pulse (bpm): 74 Weight (lbs): 140 Respiratory Rate (breaths/min): 16 Body Mass Index (BMI): 28.3 Blood Pressure (mmHg): 138/47 Reference Range: 80 - 120 mg / dl Electronic Signature(s) Signed: 03/08/2016 3:35:19 PM By: Alric Quan Entered By: Alric Quan on 03/08/2016 11:18:24

## 2016-03-15 ENCOUNTER — Encounter: Payer: Medicare Other | Attending: Surgery | Admitting: Surgery

## 2016-03-15 DIAGNOSIS — I890 Lymphedema, not elsewhere classified: Secondary | ICD-10-CM | POA: Insufficient documentation

## 2016-03-15 DIAGNOSIS — J449 Chronic obstructive pulmonary disease, unspecified: Secondary | ICD-10-CM | POA: Insufficient documentation

## 2016-03-15 DIAGNOSIS — Z87891 Personal history of nicotine dependence: Secondary | ICD-10-CM | POA: Diagnosis not present

## 2016-03-15 DIAGNOSIS — I70242 Atherosclerosis 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ration of calf: Secondary | ICD-10-CM | POA: Diagnosis not present

## 2016-03-15 DIAGNOSIS — L97122 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 exposed: Secondary | ICD-10-CM | POA: Insufficient documentation

## 2016-03-15 DIAGNOSIS — G2581 Restless legs syndrome: Secondary | ICD-10-CM | POA: Diagnosis not present

## 2016-03-15 DIAGNOSIS — I2510 Atherosclerotic heart disease of native coronary artery without angina pectoris: Secondary | ICD-10-CM | POA: Diagnosis not present

## 2016-03-15 DIAGNOSIS — G500 Trigeminal neuralgia: Secondary | ICD-10-CM | POA: Insufficient documentation

## 2016-03-15 DIAGNOSIS — E1140 Type 2 diabetes mellitus with diabetic neuropathy, unspecified: Secondary | ICD-10-CM | POA: Insufficient documentation

## 2016-03-15 DIAGNOSIS — Z79899 Other long term (current) drug therapy: Secondary | ICD-10-CM | POA: Diagnosis not present

## 2016-03-15 DIAGNOSIS — I10 Essential (primary) hypertension: Secondary | ICD-10-CM | POA: Diagnosis not present

## 2016-03-15 DIAGNOSIS — D649 Anemia, unspecified: Secondary | ICD-10-CM | POA: Diagnosis not present

## 2016-03-15 DIAGNOSIS — L97222 Non-pressure chronic ulcer of left calf with fat layer exposed: Secondary | ICD-10-CM | POA: Insufficient documentation

## 2016-03-15 DIAGNOSIS — K219 Gastro-esophageal reflux disease without esophagitis: Secondary | ICD-10-CM | POA: Insufficient documentation

## 2016-03-15 DIAGNOSIS — Z7982 Long term (current) use of aspirin: Secondary | ICD-10-CM | POA: Diagnosis not present

## 2016-03-15 DIAGNOSIS — M1990 Unspecified osteoarthritis, unspecified site: Secondary | ICD-10-CM | POA: Diagnosis not present

## 2016-03-15 NOTE — Progress Notes (Signed)
NAWAAL, ALLING ([id number]) Visit Report for 03/15/2016 Chief Complaint Document Details Patient Name: Natasha [name], Natasha [name] Date of Service: 03/15/2016 12:30 PM Medical Record Number: [id number] Patient Account Number: [id number] Date of Birth/Sex: [birth year] (83 y.o. Female) Treating RN: Montey Hora Primary Care Provider: Emily Filbert Other Clinician: Referring Provider: Emily Filbert Treating Provider/Extender: Frann Rider in Treatment: 8 Information Obtained from: Patient Chief Complaint Patient presents to the wound care center today with an open arterial ulcer along with diabetes mellitus to the left lower extremity which she's had for over a month Electronic Signature(s) Signed: 03/15/2016 1:01:59 PM By: Christin Fudge MD, FACS Entered By: Christin Fudge on 03/15/2016 13:01:59 Natasha [name] ([id number]) -------------------------------------------------------------------------------- Debridement Details Patient Name: Natasha [name] Date of Service: 03/15/2016 12:30 PM Medical Record Number: [id number] Patient Account Number: [id number] Date of Birth/Sex: 06-Apr-1928 (83 y.o. Female) Treating RN: Montey Hora Primary Care Provider: Emily Filbert Other Clinician: Referring Provider: Emily Filbert Treating Provider/Extender: Frann Rider in Treatment: 8 Debridement Performed for Wound #3 Left,Distal Lower Leg Assessment: Performed By: Physician Christin Fudge, MD Debridement: Debridement Pre-procedure Yes - 12:53 Verification/Time Out Taken: Start Time: 12:53 Pain Control: Lidocaine 4% Topical Solution Level: Skin/Subcutaneous Tissue Total Area Debrided (L x 3 (cm) x 1 (cm) = 3 (cm) W): Tissue and other Viable, Non-Viable, Fibrin/Slough, Subcutaneous material debrided: Instrument: Curette Bleeding: Minimum Hemostasis Achieved: Pressure End Time: 12:56 Procedural Pain: 0 Post Procedural Pain: 0 Response to Treatment: Procedure was  tolerated well Post Debridement Measurements of Total Wound Length: (cm) 3 Width: (cm) 1 Depth: (cm) 0.5 Volume: (cm) 1.178 Character of Wound/Ulcer Post Improved Debridement: Severity of Tissue Post Debridement: Fat layer exposed Post Procedure Diagnosis Same as Pre-procedure Electronic Signature(s) Signed: 03/15/2016 1:01:51 PM By: Christin Fudge MD, FACS Signed: 03/15/2016 2:43:40 PM By: Montey Hora Entered By: Christin Fudge on 03/15/2016 13:01:51 Natasha [name] ([id number], Natasha [name] ([id number]) -------------------------------------------------------------------------------- HPI Details Patient Name: Natasha [name] Date of Service: 03/15/2016 12:30 PM Medical Record Number: [id number] Patient Account Number: [id number] Date of Birth/Sex: [birth year] (83 y.o. Female) Treating RN: Montey Hora Primary Care Provider: Emily Filbert Other Clinician: Referring Provider: Emily Filbert Treating Provider/Extender: Frann Rider in Treatment: 8 History of Present Illness Location: left thigh and left calf in the area where there was previous vascular surgery Quality: Patient reports experiencing a dull pain to affected area(s). Severity: Patient states wound (s) are getting better. Duration: Patient has had the wound for > 2 months prior to seeking treatment at the wound center Timing: Pain in wound is Intermittent (comes and goes Context: The wound occurred when the patient had vascular surgery and the wounds were not healing very well Modifying Factors: Other treatment(s) tried include:local care and oral antibiotics Associated Signs and Symptoms: Patient reports having increase swelling. HPI Description: 83 year old female with significant past medical history of anemia, cellulitis and abscess of the leg, COPD, coronary artery disease, hypertension, osteoarthritis, peripheral vascular disease with claudication, trigeminal neuralgia and type 2 diabetes  mellitus without complications. A past surgical history significant for appendectomy, left femoral o peroneal bypass graft in October 2017, and debridement of skin and subcutis tissue on the left side in December 2017. She was treated by Dr. Lucky Cowboy earlier, with multiple left leg revascularization both endovascular and open. She is a former smoker who quit 30 years ago. When she was last seen at [facility], at the vascular surgery clinic she had a left lower extremity 3 separate wounds along the saphenectomy site. She was placed  on Bactrim DS for 10 days. She had a good signal over the peroneal artery and the bypass remains patent. Prior to surgery her left ABI was 0.46 and a right was 0.98. Note the patient was seen by Dr. Amalia Hailey of podiatry on 01/15/2016 and he saw for ulceration of her left great toe. Most recently the patient was seen by Dr. Maura Crandall, the vascular surgeon on January 5 and these notes are reviewed on Epic-- he noted that the 3 wounds on the left lower extremity are granulation very slowly, and he recommended packing with wet-to-dry Kerlix. The surgeon noted excellent signal over the peroneal artery and the recommendation was to see as at the wound center to help with healing. 02/12/2016 -- we have not seen her for 2 weeks and her husband tells me that she was admitted to the hospital with a pneumonia. I have reviewed a hospital notes, she was there between January 19 and January 22, with clinical sepsis and bilateral pneumonia, fever and leukocytosis and was treated with vancomycin and Zosyn. Vancomycin was then stopped and Zithromax was added. He was discharged home on Augmentin 02/23/2016 -- she had a arterial duplex study done recently at [facility] and her right ABI was 0.92 and the left ABI was 0.96. The femoral to peroneal bypass is widely patent. she was also seen by the surgeon who noted a postoperative seroma in the left groin. she was asked to return in 3 months for  duplex examination 03/01/16 -she has got a lot of lymphedema for left lower extremity and there is weeping from the lower lateral leg. She had been walking around a lot yesterday as she had gone shopping. Natasha [name], Natasha [name] ([id number]) 03/08/2016 -- and did used to have a lot of lymphedema for left lower extremity but does not have much pain. Electronic Signature(s) Signed: 03/15/2016 1:02:04 PM By: Christin Fudge MD, FACS Entered By: Christin Fudge on 03/15/2016 13:02:04 Natasha [name] ([id number]) -------------------------------------------------------------------------------- Physical Exam Details Patient Name: Natasha [name] Date of Service: 03/15/2016 12:30 PM Medical Record Number: [id number] Patient Account Number: [id number] Date of Birth/Sex: 1928-01-31 (83 y.o. Female) Treating RN: Montey Hora Primary Care Provider: Emily Filbert Other Clinician: Referring Provider: Emily Filbert Treating Provider/Extender: Frann Rider in Treatment: 8 Constitutional . Pulse regular. Respirations normal and unlabored. Afebrile. . Eyes Nonicteric. Reactive to light. Ears, Nose, Mouth, and Throat Lips, teeth, and gums WNL.Marland Kitchen Moist mucosa without lesions. Neck supple and nontender. No palpable supraclavicular or cervical adenopathy. Normal sized without goiter. Respiratory WNL. No retractions.. Cardiovascular Pedal Pulses WNL. No clubbing, cyanosis or edema. Lymphatic No adneopathy. No adenopathy. No adenopathy. Musculoskeletal Adexa without tenderness or enlargement.. Digits and nails w/o clubbing, cyanosis, infection, petechiae, ischemia, or inflammatory conditions.. Integumentary (Hair, Skin) No suspicious lesions. No crepitus or fluctuance. No peri-wound warmth or erythema. No masses.Marland Kitchen Psychiatric Judgement and insight Intact.. No evidence of depression, anxiety, or agitation.. Notes the wound on the left calf region needed a minimal amount of subcutaneous  debridement with a #3 curet and bleeding was controlled with pressure. The wound on the thigh barely admits the tip of a Q-tip and is looking good. Electronic Signature(s) Signed: 03/15/2016 1:02:54 PM By: Christin Fudge MD, FACS Entered By: Christin Fudge on 03/15/2016 13:02:53 Natasha [name] ([id number]) -------------------------------------------------------------------------------- Physician Orders Details Patient Name: Natasha [name] Date of Service: 03/15/2016 12:30 PM Medical Record Number: [id number] Patient Account Number: [id number] Date of Birth/Sex: 1928-03-27 (83 y.o. Female) Treating RN: Montey Hora Primary Care Provider:  Emily Filbert Other Clinician: Referring Provider: Emily Filbert Treating Provider/Extender: Frann Rider in Treatment: 8 Verbal / Phone Orders: No Diagnosis Coding Wound Cleansing Wound #1 Left,Medial Upper Leg o Clean wound with Normal Saline. o Cleanse wound with mild soap and water Wound #3 Left,Distal Lower Leg o Clean wound with Normal Saline. o Cleanse wound with mild soap and water Anesthetic Wound #1 Left,Medial Upper Leg o Topical Lidocaine 4% cream applied to wound bed prior to debridement - for clinic use Wound #3 Left,Distal Lower Leg o Topical Lidocaine 4% cream applied to wound bed prior to debridement - for clinic use Skin Barriers/Peri-Wound Care Wound #1 Left,Medial Upper Leg o Skin Prep Wound #3 Left,Distal Lower Leg o Skin Prep Primary Wound Dressing Wound #1 Left,Medial Upper Leg o Iodoform packing Gauze Wound #3 Left,Distal Lower Leg o Medihoney gel Secondary Dressing Wound #1 Left,Medial Upper Leg o Dry Gauze o Boardered Foam Dressing Wound #3 Left,Distal Lower Leg Natasha [name], Natasha [name]. ([id number]) o ABD and Kerlix/Conform Dressing Change Frequency Wound #1 Left,Medial Upper Leg o Change dressing every other day. Wound #3 Left,Distal Lower Leg o Change dressing every  other day. Follow-up Appointments Wound #1 Left,Medial Upper Leg o Return Appointment in 1 week. Wound #3 Left,Distal Lower Leg o Return Appointment in 1 week. Edema Control Wound #1 Left,Medial Upper Leg o Elevate legs to the level of the heart and pump ankles as often as possible Wound #3 Left,Distal Lower Leg o Elevate legs to the level of the heart and pump ankles as often as possible Additional Orders / Instructions Wound #1 Left,Medial Upper Leg o Increase protein intake. Wound #3 Left,Distal Lower Leg o Increase protein intake. Home Health Wound #1 Left,Medial Upper Leg o Continue Home Health Visits o Home Health Nurse may visit PRN to address patientos wound care needs. o FACE TO FACE ENCOUNTER: MEDICARE and MEDICAID PATIENTS: I certify that this patient is under my care and that I had a face-to-face encounter that meets the physician face-to-face encounter requirements with this patient on this date. The encounter with the patient was in whole or in part for the following MEDICAL CONDITION: (primary reason for [place]) MEDICAL NECESSITY: I certify, that based on my findings, NURSING services are a medically necessary home health service. HOME BOUND STATUS: I certify that my clinical findings support that this patient is homebound (i.e., Due to illness or injury, pt requires aid of supportive devices such as crutches, cane, wheelchairs, walkers, the use of special transportation or the assistance of another person to leave their place of residence. There is a normal inability to leave the home and doing so requires considerable and taxing effort. Other absences are for medical reasons / religious services and are infrequent or of short duration when for other reasons). Natasha [name], Natasha [name] ([id number]) o If current dressing causes regression in wound condition, may D/C ordered dressing product/s and apply Normal Saline Moist Dressing daily until  next [place] / Other MD appointment. [place] of regression in wound condition at [phone]. o Please direct any NON-WOUND related issues/requests for orders to patient's Primary Care Physician Wound #3 Left,Distal Lower Leg o [place] Nurse may visit PRN to address patientos wound care needs. o FACE TO FACE ENCOUNTER: MEDICARE and MEDICAID PATIENTS: I certify that this patient is under my care and that I had a face-to-face encounter that meets the physician face-to-face encounter requirements with this patient on this date. The encounter  with the patient was in whole or in part for the following MEDICAL CONDITION: (primary reason for [place]) MEDICAL NECESSITY: I certify, that based on my findings, NURSING services are a medically necessary home health service. HOME BOUND STATUS: I certify that my clinical findings support that this patient is homebound (i.e., Due to illness or injury, pt requires aid of supportive devices such as crutches, cane, wheelchairs, walkers, the use of special transportation or the assistance of another person to leave their place of residence. There is a normal inability to leave the home and doing so requires considerable and taxing effort. Other absences are for medical reasons / religious services and are infrequent or of short duration when for other reasons). o If current dressing causes regression in wound condition, may D/C ordered dressing product/s and apply Normal Saline Moist Dressing daily until next [place] / Other MD appointment. [place] of regression in wound condition at [phone]. o Please direct any NON-WOUND related issues/requests for orders to patient's Primary Care Physician Electronic Signature(s) Signed: 03/15/2016 2:21:38 PM By: Christin Fudge MD, FACS Signed: 03/15/2016 2:43:40 PM By: Montey Hora Entered By: Montey Hora on 03/15/2016 12:57:05 Natasha [name] ([id number]) -------------------------------------------------------------------------------- Problem List Details Patient Name: Natasha [name] Date of Service: 03/15/2016 12:30 PM Medical Record Number: [id number] Patient Account Number: [id number] Date of Birth/Sex: [birth year] (83 y.o. 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 Female) Treating RN: Montey Hora Primary Care Provider: Emily Filbert Other Clinician: Referring Provider: Emily Filbert Treating Provider/Extender: Frann Rider in Treatment: 8 Subjective Chief Complaint Information obtained from Patient Patient presents to the wound care center today with an open arterial ulcer along with diabetes mellitus to the left lower extremity which she's had for over a month History of Present Illness (HPI) The following HPI elements were documented for the  patient's wound: Location: left thigh and left calf in the area where there was previous vascular surgery Quality: Patient reports experiencing a dull pain to affected area(s). Severity: Patient states wound (s) are getting better. Duration: Patient has had the wound for > 2 months prior to seeking treatment at the wound center Timing: Pain in wound is Intermittent (comes and goes Context: The wound occurred when the patient had vascular surgery and the wounds were not healing very well Modifying Factors: Other treatment(s) tried include:local care and oral antibiotics Associated Signs and Symptoms: Patient reports having increase swelling. 83 year old female with significant past medical history of anemia, cellulitis and abscess of the leg, COPD, coronary artery disease, hypertension, osteoarthritis, peripheral vascular disease with claudication, trigeminal neuralgia and type 2 diabetes mellitus without complications. A past surgical history significant for appendectomy, left femoral peroneal bypass graft in October 2017, and debridement of skin  and subcutis tissue on the left side in December 2017. She was treated by Dr. Lucky Cowboy earlier, with multiple left leg revascularization both endovascular and open. She is a former smoker who quit 30 years ago. When she was last seen at [facility], at the vascular surgery clinic she had a left lower extremity 3 separate wounds along the saphenectomy site. She was placed on Bactrim DS for 10 days. She had a good signal over the peroneal artery and the bypass remains patent. Prior to surgery her left ABI was 0.46 and a right was 0.98. Note the patient was seen by Dr. Amalia Hailey of podiatry on 01/15/2016 and he saw for ulceration of her left great toe. Most recently the patient was seen by Dr. Maura Crandall, the vascular surgeon on January 5 and these notes are reviewed on Epic-- he noted that the 3 wounds on the left lower extremity are granulation very slowly, and  he recommended packing with wet-to-dry Kerlix. The surgeon noted excellent signal over the peroneal artery and the recommendation was to see as at the wound center to help with healing. 02/12/2016 -- we have not seen her for 2 weeks and her husband tells me that she was admitted to the hospital with a pneumonia. I have reviewed a hospital notes, she was there between January 19 and January 22, with clinical sepsis and bilateral pneumonia, fever and leukocytosis and was treated with Natasha [name], Natasha [name]. ([id number]) vancomycin and Zosyn. Vancomycin was then stopped and Zithromax was added. He was discharged home on Augmentin 02/23/2016 -- she had a arterial duplex study done recently at [facility] and her right ABI was 0.92 and the left ABI was 0.96. The femoral to peroneal bypass is widely patent. she was also seen by the surgeon who noted a postoperative seroma in the left groin. she was asked to return in 3 months for duplex examination 03/01/16 -she has got a lot of lymphedema for left lower extremity and there is weeping from the lower lateral leg. She had been walking around a lot yesterday as she had gone shopping. 03/08/2016 -- and did used to have a lot of lymphedema for left lower extremity but does not have much pain. Objective Constitutional Pulse regular. Respirations normal and unlabored. Afebrile. Vitals Time Taken: 12:37 PM, Height: 59 in, Weight: 140 lbs, BMI: 28.3, Temperature: 97.4 F, Pulse: 74 bpm, Respiratory Rate: 18 breaths/min, Blood Pressure: 134/52 mmHg. Eyes Nonicteric. Reactive to light. Ears, Nose, Mouth, and Throat Lips, teeth, and gums WNL.Marland Kitchen Moist mucosa without lesions. Neck supple and nontender. No palpable supraclavicular or cervical adenopathy. Normal sized without goiter. Respiratory WNL. No retractions.. Cardiovascular Pedal Pulses WNL. No clubbing, cyanosis or edema. Lymphatic No adneopathy. No adenopathy. No adenopathy. Musculoskeletal Adexa  without tenderness or enlargement.. Digits and nails w/o clubbing, cyanosis, infection, petechiae, ischemia, or inflammatory conditions.Marland Kitchen Psychiatric Natasha [name], Natasha [name] ([id number]) Judgement and insight Intact.. No evidence of depression, anxiety, or agitation.. General Notes: the wound on the left calf region needed a minimal amount of subcutaneous debridement with a #3 curet and bleeding was controlled with pressure. The wound on the thigh barely admits the tip of a Q-tip and is looking good. Integumentary (Hair, Skin) No suspicious lesions. No crepitus or fluctuance. No peri-wound warmth or erythema. No masses.. Wound #1 status is Open. Original cause of wound was Surgical Injury. The wound is located on the Left,Medial Upper Leg. The wound measures 1.5cm length x 0.3cm width x 0.8cm depth; 0.353cm^2 area and 0.283cm^3 volume. There is  Fat Layer (Subcutaneous Tissue) Exposed exposed. There is no tunneling or undermining noted. There is a medium amount of serosanguineous drainage noted. The wound margin is epibole. There is large (67-100%) red, pink granulation within the wound bed. There is a small (1-33%) amount of necrotic tissue within the wound bed including Adherent Slough. The periwound skin appearance exhibited: Scarring. The periwound skin appearance did not exhibit: Erythema. Periwound temperature was noted as No Abnormality. The periwound has tenderness on palpation. Wound #3 status is Open. Original cause of wound was Surgical Injury. The wound is located on the Left,Distal Lower Leg. The wound measures 3cm length x 1cm width x 0.5cm depth; 2.356cm^2 area and 1.178cm^3 volume. There is Fat Layer (Subcutaneous Tissue) Exposed exposed. There is no tunneling or undermining noted. There is a large amount of serosanguineous drainage noted. The wound margin is epibole. There is medium (34-66%) red, pink granulation within the wound bed. There is a medium (34- 66%) amount of necrotic  tissue within the wound bed including Adherent Slough. The periwound skin appearance exhibited: Scarring. The periwound skin appearance did not exhibit: Callus, Crepitus, Excoriation, Induration, Rash, Dry/Scaly, Maceration, Atrophie Blanche, Cyanosis, Ecchymosis, Hemosiderin Staining, Mottled, Pallor, Rubor, Erythema. Periwound temperature was noted as No Abnormality. The periwound has tenderness on palpation. 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 There was a Skin/Subcutaneous Tissue Debridement ([id number]) debridement with total area of 3 sq cm performed by Christin Fudge, MD. with the following instrument(s): Curette to remove Viable and Non-Viable tissue/material including Fibrin/Slough and Subcutaneous after achieving pain control using Lidocaine 4% Topical Solution. A time out was conducted at 12:53, prior to the start of the procedure. A Minimum amount of bleeding was controlled with Pressure. The procedure was tolerated well with a pain level of 0 throughout and a pain level of 0 following the procedure. Post Debridement Measurements: 3cm length x 1cm width x 0.5cm depth; 1.178cm^3 volume. Character of Wound/Ulcer Post Debridement is improved. Severity of Tissue Post Debridement is: Fat layer exposed. Post procedure Diagnosis Wound #3: Same as Pre-Procedure Plan Wound Cleansing: Wound #1 Left,Medial Upper Leg: Clean wound with Normal Saline. Cleanse wound with mild soap and water Wound #3 Left,Distal Lower Leg: Clean wound with Normal Saline. Cleanse wound with mild soap and  water Anesthetic: Wound #1 Left,Medial Upper Leg: Topical Lidocaine 4% cream applied to wound bed prior to debridement - for clinic use Wound #3 Left,Distal Lower Leg: Topical Lidocaine 4% cream applied to wound bed prior to debridement - for clinic use Skin Barriers/Peri-Wound Care: Wound #1 Left,Medial Upper Leg: Skin Prep Wound #3 Left,Distal Lower Leg: Skin Prep Primary Wound Dressing: Wound #1 Left,Medial Upper Leg: Iodoform packing Gauze Wound #3 Left,Distal Lower Leg: Medihoney gel Secondary Dressing: Wound #1 Left,Medial Upper Leg: Dry Gauze Boardered Foam Dressing Wound #3 Left,Distal Lower Leg: ABD and Kerlix/Conform Dressing Change Frequency: Natasha [name], Natasha [name] ([id number]) Wound #1 Left,Medial Upper Leg: Change dressing every other day. Wound #3 Left,Distal Lower Leg: Change dressing every other day. Follow-up Appointments: Wound #1 Left,Medial Upper Leg: Return Appointment in 1 week. Wound #3 Left,Distal Lower Leg: Return Appointment in 1 week. Edema Control: Wound #1 Left,Medial  Upper Leg: Elevate legs to the level of the heart and pump ankles as often as possible Wound #3 Left,Distal Lower Leg: Elevate legs to the level of the heart and pump ankles as often as possible Additional Orders / Instructions: Wound #1 Left,Medial Upper Leg: Increase protein intake. Wound #3 Left,Distal Lower Leg: Increase protein intake. Home Health: Wound #1 Left,Medial Upper Leg: [place] Nurse may visit PRN to address patient s wound care needs. FACE TO FACE ENCOUNTER: MEDICARE and MEDICAID PATIENTS: I certify that this patient is under my care and that I had a face-to-face encounter that meets the physician face-to-face encounter requirements with this patient on this date. The encounter with the patient was in whole or in part for the following MEDICAL CONDITION: (primary reason for [place]) MEDICAL NECESSITY: I certify, that  based on my findings, NURSING services are a medically necessary home health service. HOME BOUND STATUS: I certify that my clinical findings support that this patient is homebound (i.e., Due to illness or injury, pt requires aid of supportive devices such as crutches, cane, wheelchairs, walkers, the use of special transportation or the assistance of another person to leave their place of residence. There is a normal inability to leave the home and doing so requires considerable and taxing effort. Other absences are for medical reasons / religious services and are infrequent or of short duration when for other reasons). If current dressing causes regression in wound condition, may D/C ordered dressing product/s and apply Normal Saline Moist Dressing daily until next [place] / Other MD appointment. [place] of regression in wound condition at [phone]. Please direct any NON-WOUND related issues/requests for orders to patient's Primary Care Physician Wound #3 Left,Distal Lower Leg: [place] Nurse may visit PRN to address patient s wound care needs. FACE TO FACE ENCOUNTER: MEDICARE and MEDICAID PATIENTS: I certify that this patient is under my care and that I had a face-to-face encounter that meets the physician face-to-face encounter requirements with this patient on this date. The encounter with the patient was in whole or in part for the following MEDICAL CONDITION: (primary reason for [place]) MEDICAL NECESSITY: I certify, that based on my findings, NURSING services are a medically necessary home health service. HOME BOUND STATUS: I certify that my clinical findings support that this patient is homebound (i.e., Due to illness or injury, pt requires aid of supportive devices such as crutches, cane, wheelchairs, walkers, the use of special transportation or the assistance of another person to leave their place of residence.  There is a normal inability to leave the home and doing so requires considerable and taxing effort. Other absences are for medical reasons / religious services and are infrequent or of short duration when for other reasons). If current dressing causes regression in wound condition, may D/C ordered dressing product/s and apply Natasha [name], Natasha [name] ([id number]) Normal Saline Moist Dressing daily until next [place] / Other MD appointment. [place] of regression in wound condition at [phone]. Please direct any NON-WOUND related issues/requests for orders to patient's Primary Care Physician I have recommended: 1. packing of the thigh wound with 1/4 inch iodoform gauze and an appropriate foam border. 2. the wound lower down will be covered with Medihoney and an appropriate bordered foam 3. elevation and exercise of both lower extremities 4. Good amount of protein intake, vitamin A, vitamin C and zinc 5. Regular visits to the  wound center Electronic Signature(s) Signed: 03/15/2016 1:03:48 PM By: Christin Fudge MD, FACS Entered By: Christin Fudge on 03/15/2016 13:03:48 Natasha [name] ([id number]) -------------------------------------------------------------------------------- SuperBill Details Patient Name: Natasha [name] Date of Service: 03/15/2016 Medical Record Number: [id number] Patient Account Number: [id number] Date of Birth/Sex: 1928-12-24 (83 y.o. 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 exposed I89.0 Lymphedema,  not elsewhere classified Facility Procedures CPT4 Code Description: 92010071 11042 - DEB SUBQ TISSUE 20 SQ CM/< ICD-10 Description Diagnosis I70.242 Atherosclerosis 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 L97.222 Non-pressure chronic ulcer of left calf with fat layer e L97.122 Non-pressure chronic  ulcer of left thigh with fat layer I89.0 Lymphedema, not elsewhere classified Modifier: ration of ca xposed exposed Quantity: 1 lf Physician Procedures CPT4 Code Description: 2197588 [id number] - WC PHYS SUBQ TISS 20 SQ CM ICD-10 Description Diagnosis I70.242 Atherosclerosis 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 L97.222 Non-pressure chronic ulcer of left calf with fat layer e L97.122 Non-pressure chronic  ulcer of left thigh with fat layer I89.0 Lymphedema, not elsewhere classified Modifier: ration of ca xposed exposed Quantity: 1 lf Electronic Signature(s) Signed: 03/15/2016 1:04:07 PM By: Christin Fudge MD, FACS Entered By: Christin Fudge on 03/15/2016 13:04:07

## 2016-03-15 NOTE — Progress Notes (Signed)
LILLIEANNA, TUOHY ([id number]) Visit Report for 03/15/2016 Arrival Information Details Patient Name: Natasha [name], Natasha [name] Date of Service: 03/15/2016 12:30 PM Medical Record Number: [id number] Patient Account Number: [id number] Date of Birth/Sex: 1928/01/12 (83 y.o. Female) Treating RN: Montey Hora Primary Care Kimberli Winne: Emily Filbert Other Clinician: Referring Myrikal Messmer: Emily Filbert Treating Cassanda Walmer/Extender: Frann Rider in Treatment: 8 Visit Information History Since Last Visit Added or deleted any medications: No Patient Arrived: Walker Any new allergies or adverse reactions: No Arrival Time: 12:36 Had a fall or experienced change in No Accompanied By: spouse activities of daily living that may affect Transfer Assistance: None risk of falls: Patient Identification Verified: Yes Signs or symptoms of abuse/neglect since last No Secondary Verification Process Yes visito Completed: Hospitalized since last visit: No Patient Requires Transmission- No Has Dressing in Place as Prescribed: Yes Based Precautions: Pain Present Now: No Patient Has Alerts: Yes Patient Alerts: Patient on Blood Thinner Plavix Electronic Signature(s) Signed: 03/15/2016 2:43:40 PM By: Montey Hora Entered By: Montey Hora on 03/15/2016 12:36:36 Natasha [name] ([id number]) -------------------------------------------------------------------------------- Encounter Discharge Information Details Patient Name: Natasha [name] Date of Service: 03/15/2016 12:30 PM Medical Record Number: [id number] Patient Account Number: [id number] Date of Birth/Sex: 04-21-1928 (83 y.o. Female) Treating RN: Montey Hora Primary Care Yong Grieser: Emily Filbert Other Clinician: Referring Mays Paino: Emily Filbert Treating [place]le[place] Langstaff/Extender: Frann Rider in Treatment: 8 Encounter Discharge Information Items Discharge Pain Level: 0 Discharge Condition: Stable Ambulatory Status: Walker Discharge  Destination: Home Transportation: Private Auto Accompanied By: spouse Schedule Follow-up Appointment: Yes Medication Reconciliation completed No and provided to Patient/Care Tacey Dimaggio: Provided on Clinical Summary of Care: 03/15/2016 Form Type Recipient Paper Patient [facility] Electronic Signature(s) Signed: 03/15/2016 1:13:02 PM By: Ruthine Dose Entered By: Ruthine Dose on 03/15/2016 13:13:02 Natasha [name] ([id number]) -------------------------------------------------------------------------------- Lower Extremity Assessment Details Patient Name: Natasha [name] Date of Service: 03/15/2016 12:30 PM Medical Record Number: [id number] Patient Account Number: [id number] Date of Birth/Sex: 1928/10/27 (83 y.o. Female) Treating RN: Montey Hora Primary Care Laketa Sandoz: Emily Filbert Other Clinician: Referring Iman Orourke: Emily Filbert Treating Bentlie Catanzaro/Extender: Frann Rider in Treatment: 8 Edema Assessment Assessed: [Left: No] [Right: No] Edema: [Left: Ye] [Right: s] Calf Left: Right: Point of Measurement: 28 cm From Medial Instep cm cm Ankle Left: Right: Point of Measurement: 10 cm From Medial Instep cm cm Vascular Assessment Pulses: Dorsalis Pedis Palpable: [Left:Yes] Posterior Tibial Extremity colors, hair growth, and conditions: Extremity Color: [Left:Normal] Hair Growth on Extremity: [Left:Yes] Temperature of Extremity: [Left:Warm] Capillary Refill: [Left:< 3 seconds] Electronic Signature(s) Signed: 03/15/2016 2:20:43 PM By: Montey Hora Entered By: Montey Hora on 03/15/2016 14:20:42 Natasha [name] ([id number]) -------------------------------------------------------------------------------- Multi Wound Chart Details Patient Name: Natasha [name] Date of Service: 03/15/2016 12:30 PM Medical Record Number: [id number] Patient Account Number: [id number] Date of Birth/Sex: [birth year] (83 y.o. Female) Treating RN: Montey Hora Primary Care Harbour Nordmeyer:  Emily Filbert Other Clinician: Referring Annick Dimaio: Emily Filbert Treating Quintell Bonnin/Extender: Frann Rider in Treatment: 8 Vital Signs Height(in): 59 Pulse(bpm): 74 Weight(lbs): 140 Blood Pressure 134/52 (mmHg): Body Mass Index(BMI): 28 Temperature(F): 97.4 Respiratory Rate 18 (breaths/min): Photos: [N/A:N/A] Wound Location: Left Upper Leg - Medial Left Lower Leg - Distal N/A Wounding Event: Surgical Injury Surgical Injury N/A Primary Etiology: Open Surgical Wound Open Surgical Wound N/A Comorbid History: Anemia, Asthma, Anemia, Asthma, N/A Hypertension, Peripheral Hypertension, Peripheral Venous Disease, Venous Disease, Osteoarthritis, Neuropathy Osteoarthritis, Neuropathy Date Acquired: 12/04/2015 12/04/2015 N/A Weeks of Treatment: 8 8 N/A Wound Status: Open Open N/A Measurements L x W x D 1.5x0.3x0.8 3x1x0.5 N/A (  cm) Area (cm) : 0.353 2.356 N/A Volume (cm) : 0.283 1.178 N/A % Reduction in Area: 62.50% 86.70% N/A % Reduction in Volume: 78.50% 90.50% N/A Classification: Full Thickness With Full Thickness With N/A Exposed Support Exposed Support Structures Structures Exudate Amount: Medium Large N/A Exudate Type: Serosanguineous Serosanguineous N/A Exudate Color: red, brown red, brown N/A Wound Margin: Epibole Epibole N/A Natasha [name], Natasha [name] ([id number]) Granulation Amount: Large (67-100%) Medium (34-66%) N/A Granulation Quality: Red, Pink Red, Pink, Hyper- N/A granulation Necrotic Amount: Small (1-33%) Medium (34-66%) N/A Exposed Structures: Fat Layer (Subcutaneous Fat Layer (Subcutaneous N/A Tissue) Exposed: Yes Tissue) Exposed: Yes Fascia: No Fascia: No Tendon: No Tendon: No Muscle: No Muscle: No Joint: No Joint: No Bone: No Bone: No Epithelialization: Small (1-33%) None N/A Debridement: N/A Debridement ([id number]) Pre-procedure N/A 12:53 N/A Verification/Time Out Taken: Pain Control: N/A Lidocaine 4% Topical N/A Solution Tissue  Debrided: N/A Fibrin/Slough, N/A Subcutaneous Level: N/A Skin/Subcutaneous N/A Tissue Debridement Area (sq N/A 3 N/A cm): Instrument: N/A Curette N/A Bleeding: N/A Minimum N/A Hemostasis Achieved: N/A Pressure N/A Procedural Pain: N/A 0 N/A Post Procedural Pain: N/A 0 N/A Debridement Treatment N/A Procedure was tolerated N/A Response: well Post Debridement N/A 3x1x0.5 N/A Measurements L x W x D (cm) Post Debridement N/A 1.178 N/A Volume: (cm) Periwound Skin Texture: Scarring: Yes Scarring: Yes N/A Excoriation: No Induration: No Callus: No Crepitus: No Rash: No Periwound Skin No Abnormalities Noted Maceration: No N/A Moisture: Dry/Scaly: No Periwound Skin Color: Erythema: No Atrophie Blanche: No N/A Cyanosis: No Ecchymosis: No Erythema: No Hemosiderin Staining: No Natasha [name], Natasha [name] ([id number]) Mottled: No Pallor: No Rubor: No Temperature: No Abnormality No Abnormality N/A Tenderness on Yes Yes N/A Palpation: Wound Preparation: Ulcer Cleansing: Ulcer Cleansing: N/A Rinsed/Irrigated with Rinsed/Irrigated with Saline Saline Topical Anesthetic Topical Anesthetic Applied: Other: lidocaine Applied: Other: lidocaine 4% 4% Procedures Performed: N/A Debridement N/A Treatment Notes Wound #1 (Left, Medial Upper Leg) 1. Cleansed with: Clean wound with Normal Saline 2. Anesthetic Topical Lidocaine 4% cream to wound bed prior to debridement 4. Dressing Applied: Iodoform packing Gauze 5. Secondary Dressing Applied Bordered Foam Dressing Dry Gauze Wound #3 (Left, Distal Lower Leg) 1. Cleansed with: Clean wound with Normal Saline 2. Anesthetic Topical Lidocaine 4% cream to wound bed prior to debridement 4. Dressing Applied: Medihoney Gel 5. Secondary Dressing Applied Guaze, ABD and kerlix/Conform 7. Secured with Recruitment consultant) Signed: 03/15/2016 1:01:44 PM By: Christin Fudge MD, FACS Entered By: Christin Fudge on 03/15/2016 13:01:44 Natasha [name] ([id number]) -------------------------------------------------------------------------------- [place] Details Patient Name: Natasha [name] Date of Service: 03/15/2016 12:30 PM Medical Record Number: [id number] Patient Account Number: [id number] Date of Birth/Sex: 07/02/1928 (83 y.o. Female) Treating RN: Montey Hora Primary Care Aigner Horseman: Emily Filbert Other Clinician: Referring Sean Malinowski: Emily Filbert Treating Mehr Depaoli/Extender: Frann Rider in Treatment: 8 Active Inactive ` Abuse / Safety / Falls / Self Care Management Nursing Diagnoses: Potential for falls Goals: Patient will remain injury free Date Initiated: 01/19/2016 Target Resolution Date: 05/25/2016 Goal Status: Active Interventions: Assess fall risk on admission and as needed Assess: immobility, friction, shearing, incontinence upon admission and as needed Assess impairment of mobility on admission and as needed per policy Notes: ` Nutrition Nursing Diagnoses: Imbalanced nutrition Goals: Patient/caregiver agrees to and verbalizes understanding of need to use nutritional supplements and/or vitamins as prescribed Date Initiated: 01/19/2016 Target Resolution Date: 05/25/2016 Goal Status: Active Interventions: Assess patient nutrition upon admission and as needed per policy Notes: ` Orientation to the Wound Care Program  Natasha [name], Natasha [name] ([id number]) Nursing Diagnoses: Knowledge deficit related to the wound healing center program Goals: Patient/caregiver will verbalize understanding of the [facility] Date Initiated: 01/19/2016 Target Resolution Date: 05/25/2016 Goal Status: Active Interventions: Provide education on orientation to the wound center Notes: ` Pain, Acute or Chronic Nursing Diagnoses: Pain, acute or chronic: actual or potential Potential alteration in comfort, pain Goals: Patient will verbalize adequate pain control and receive pain  control interventions during procedures as needed Date Initiated: 01/19/2016 Target Resolution Date: 05/25/2016 Goal Status: Active Patient/caregiver will verbalize adequate pain control between visits Date Initiated: 01/19/2016 Target Resolution Date: 05/25/2016 Goal Status: Active Patient/caregiver will verbalize comfort level met Date Initiated: 01/19/2016 Target Resolution Date: 05/25/2016 Goal Status: Active Interventions: Assess comfort goal upon admission Complete pain assessment as per visit requirements Notes: ` Wound/Skin Impairment Nursing Diagnoses: Impaired tissue integrity Knowledge deficit related to ulceration/compromised skin integrity Goals: Natasha [name], Natasha [name] ([id number]) Ulcer/skin breakdown will have a volume reduction of 30% by week 4 Date Initiated: 01/19/2016 Target Resolution Date: 05/25/2016 Goal Status: Active Ulcer/skin breakdown will have a volume reduction of 50% by week 8 Date Initiated: 01/19/2016 Target Resolution Date: 05/25/2016 Goal Status: Active Ulcer/skin breakdown will have a volume reduction of 80% by week 12 Date Initiated: 01/19/2016 Target Resolution Date: 05/25/2016 Goal Status: Active Interventions: Assess patient/caregiver ability to perform ulcer/skin care regimen upon admission and as needed Assess ulceration(s) every visit Notes: Electronic Signature(s) Signed: 03/15/2016 2:43:40 PM By: Montey Hora Entered By: Montey Hora on 03/15/2016 12:54:38 Natasha [name] ([id number]) -------------------------------------------------------------------------------- Pain Assessment Details Patient Name: Natasha [name] Date of Service: 03/15/2016 12:30 PM Medical Record Number: [id number] Patient Account Number: [id number] Date of Birth/Sex: 12-15-1928 (83 y.o. Female) Treating RN: Montey Hora Primary Care Salam Chesterfield: Emily Filbert Other Clinician: Referring Zhyon Antenucci: Emily Filbert Treating Lynzee Lindquist/Extender: Frann Rider in  Treatment: 8 Active Problems Location of Pain Severity and Description of Pain Patient Has Paino Yes Site Locations Pain Location: Pain in Ulcers With Dressing Change: Yes Duration of the Pain. Constant / Intermittento Intermittent Pain Management and Medication Current Pain Management: Notes Topical or injectable lidocaine is offered to patient for acute pain when surgical debridement is performed. If needed, Patient is instructed to use over the counter pain medication for the following 24-48 hours after debridement. Wound care MDs do not prescribed pain medications. Patient has chronic pain or uncontrolled pain. Patient has been instructed to make an appointment with their Primary Care Physician for pain management. Electronic Signature(s) Signed: 03/15/2016 2:43:40 PM By: Montey Hora Entered By: Montey Hora on 03/15/2016 12:37:43 Natasha [name] ([id number]) -------------------------------------------------------------------------------- Patient/Caregiver Education Details Patient Name: Natasha [name] Date of Service: 03/15/2016 12:30 PM Medical Record Number: [id number] Patient Account Number: [id number] Date of Birth/Gender: Aug 08, 1928 (83 y.o. Female) Treating RN: Montey Hora Primary Care Physician: Emily Filbert Other Clinician: Referring Physician: Emily Filbert Treating Physician/Extender: Frann Rider in Treatment: 8 Education Assessment Education Provided To: Patient and Caregiver Education Topics Provided Wound/Skin Impairment: Handouts: Other: wound care as ordered Methods: Demonstration, Explain/Verbal Responses: State content correctly Electronic Signature(s) Signed: 03/15/2016 2:43:40 PM By: Montey Hora Entered By: Montey Hora on 03/15/2016 12:58:19 Natasha [name] ([id number]) -------------------------------------------------------------------------------- Wound Assessment Details Patient Name: Natasha [name] Date  of Service: 03/15/2016 12:30 PM Medical Record Number: [id number] Patient Account Number: [id number] Date of Birth/Sex: 02-06-1928 (83 y.o. Female) Treating RN: Montey Hora Primary Care Dionisia Pacholski: Emily Filbert Other Clinician: Referring Safira Proffit: Emily Filbert Treating Josslynn Mentzer/Extender: Frann Rider in Treatment: 8 Wound  Status Wound Number: 1 Primary Open Surgical Wound Etiology: Wound Location: Left Upper Leg - Medial Wound Open Wounding Event: Surgical Injury Status: Date Acquired: 12/04/2015 Comorbid Anemia, Asthma, Hypertension, Weeks Of Treatment: 8 History: Peripheral Venous Disease, Clustered Wound: No Osteoarthritis, Neuropathy Photos Wound Measurements Length: (cm) 1.5 Width: (cm) 0.3 Depth: (cm) 0.8 Area: (cm) 0.353 Volume: (cm) 0.283 % Reduction in Area: 62.5% % Reduction in Volume: 78.5% Epithelialization: Small (1-33%) Tunneling: No Undermining: No Wound Description Full Thickness With Exposed Classification: Support Structures Wound Margin: Epibole Exudate Medium Amount: Exudate Type: Serosanguineous Exudate Color: red, brown Foul Odor After Cleansing: No Slough/Fibrino Yes Wound Bed Granulation Amount: Large (67-100%) Exposed Structure Granulation Quality: Red, Pink Fascia Exposed: No Necrotic Amount: Small (1-33%) Fat Layer (Subcutaneous Tissue) Exposed: Yes Natasha [name], Natasha [name] ([id number]) Necrotic Quality: Adherent Slough Tendon Exposed: No Muscle Exposed: No Joint Exposed: No Bone Exposed: No Periwound Skin Texture Texture Color No Abnormalities Noted: No No Abnormalities Noted: No Scarring: Yes Erythema: No Moisture Temperature / Pain No Abnormalities Noted: No Temperature: No Abnormality Tenderness on Palpation: Yes Wound Preparation Ulcer Cleansing: Rinsed/Irrigated with Saline Topical Anesthetic Applied: Other: lidocaine 4%, Treatment Notes Wound #1 (Left, Medial Upper Leg) 1. Cleansed with: Clean wound with  Normal Saline 2. Anesthetic Topical Lidocaine 4% cream to wound bed prior to debridement 4. Dressing Applied: Iodoform packing Gauze 5. Secondary Dressing Applied Bordered Foam Dressing Dry Gauze Electronic Signature(s) Signed: 03/15/2016 2:43:40 PM By: Montey Hora Entered By: Montey Hora on 03/15/2016 12:54:00 Natasha [name] ([id number]) -------------------------------------------------------------------------------- Wound Assessment Details Patient Name: Natasha [name] Date of Service: 03/15/2016 12:30 PM Medical Record Number: [id number] Patient Account Number: [id number] Date of Birth/Sex: 1928/09/12 (83 y.o. Female) Treating RN: Montey Hora Primary Care Jamaurie Bernier: Emily Filbert Other Clinician: Referring Denielle Bayard: Emily Filbert Treating Ader Fritze/Extender: Frann Rider in Treatment: 8 Wound Status Wound Number: 3 Primary Open Surgical Wound Etiology: Wound Location: Left Lower Leg - Distal Wound Open Wounding Event: Surgical Injury Status: Date Acquired: 12/04/2015 Comorbid Anemia, Asthma, Hypertension, Weeks Of Treatment: 8 History: Peripheral Venous Disease, Clustered Wound: No Osteoarthritis, Neuropathy Photos Wound Measurements Length: (cm) 3 Width: (cm) 1 Depth: (cm) 0.5 Area: (cm) 2.356 Volume: (cm) 1.178 % Reduction in Area: 86.7% % Reduction in Volume: 90.5% Epithelialization: None Tunneling: No Undermining: No Wound Description Full Thickness With Exposed Classification: Support Structures Wound Margin: Epibole Exudate Large Amount: Exudate Type: Serosanguineous Exudate Color: red, brown Foul Odor After Cleansing: No Slough/Fibrino Yes Wound Bed Granulation Amount: Medium (34-66%) Exposed Structure Granulation Quality: Red, Pink, Hyper-granulation Fascia Exposed: No Necrotic Amount: Medium (34-66%) Fat Layer (Subcutaneous Tissue) Exposed: Yes Natasha [name], Natasha [name] ([id number]) Necrotic Quality: Adherent  Slough Tendon Exposed: No Muscle Exposed: No Joint Exposed: No Bone Exposed: No Periwound Skin Texture Texture Color No Abnormalities Noted: No No Abnormalities Noted: No Callus: No Atrophie Blanche: No Crepitus: No Cyanosis: No Excoriation: No Ecchymosis: No Induration: No Erythema: No Rash: No Hemosiderin Staining: No Scarring: Yes Mottled: No Pallor: No Moisture Rubor: No No Abnormalities Noted: No Dry / Scaly: No Temperature / Pain Maceration: No Temperature: No Abnormality Tenderness on Palpation: Yes Wound Preparation Ulcer Cleansing: Rinsed/Irrigated with Saline Topical Anesthetic Applied: Other: lidocaine 4%, Treatment Notes Wound #3 (Left, Distal Lower Leg) 1. Cleansed with: Clean wound with Normal Saline 2. Anesthetic Topical Lidocaine 4% cream to wound bed prior to debridement 4. Dressing Applied: Medihoney Gel 5. Secondary Dressing Applied Guaze, ABD and kerlix/Conform 7. Secured with Recruitment consultant) Signed: 03/15/2016 2:43:40 PM By: Montey Hora Entered  By: Montey Hora on 03/15/2016 12:54:26 Natasha [name] ([id number]) -------------------------------------------------------------------------------- Vitals Details Patient Name: Natasha [name] Date of Service: 03/15/2016 12:30 PM Medical Record Number: [id number] Patient Account Number: [id number] Date of Birth/Sex: 08/01/1928 (83 y.o. Female) Treating RN: Montey Hora Primary Care Mousa Prout: Emily Filbert Other Clinician: Referring Zeshan Sena: Emily Filbert Treating Jayceon Troy/Extender: Frann Rider in Treatment: 8 Vital Signs Time Taken: 12:37 Temperature (F): 97.4 Height (in): 59 Pulse (bpm): 74 Weight (lbs): 140 Respiratory Rate (breaths/min): 18 Body Mass Index (BMI): 28.3 Blood Pressure (mmHg): 134/52 Reference Range: 80 - 120 mg / dl Electronic Signature(s) Signed: 03/15/2016 2:43:40 PM By: Montey Hora Entered By: Montey Hora on 03/15/2016  12:38:41

## 2016-03-22 ENCOUNTER — Ambulatory Visit: Payer: Medicare Other | Admitting: Surgery

## 2016-03-26 ENCOUNTER — Encounter: Payer: Medicare Other | Admitting: Surgery

## 2016-03-27 ENCOUNTER — Emergency Department: Payer: Medicare Other

## 2016-03-27 ENCOUNTER — Encounter: Payer: Self-pay | Admitting: Emergency Medicine

## 2016-03-27 ENCOUNTER — Inpatient Hospital Stay
Admission: EM | Admit: 2016-03-27 | Discharge: 2016-03-31 | DRG: 193 | Disposition: A | Discharge status: Home / Self Care | Payer: Medicare Other | Attending: Internal Medicine | Admitting: Internal Medicine

## 2016-03-27 ENCOUNTER — Inpatient Hospital Stay: Payer: Medicare Other

## 2016-03-27 DIAGNOSIS — R0902 Hypoxemia: Secondary | ICD-10-CM | POA: Diagnosis not present

## 2016-03-27 DIAGNOSIS — Z7982 Long term (current) use of aspirin: Secondary | ICD-10-CM | POA: Diagnosis not present

## 2016-03-27 DIAGNOSIS — M79671 Pain in right foot: Secondary | ICD-10-CM

## 2016-03-27 DIAGNOSIS — Z87891 Personal history of nicotine dependence: Secondary | ICD-10-CM

## 2016-03-27 DIAGNOSIS — E876 Hypokalemia: Secondary | ICD-10-CM | POA: Diagnosis present

## 2016-03-27 DIAGNOSIS — Z8701 Personal history of pneumonia (recurrent): Secondary | ICD-10-CM

## 2016-03-27 DIAGNOSIS — K219 Gastro-esophageal reflux disease without esophagitis: Secondary | ICD-10-CM | POA: Diagnosis present

## 2016-03-27 DIAGNOSIS — Z7902 Long term (current) use of antithrombotics/antiplatelets: Secondary | ICD-10-CM | POA: Diagnosis not present

## 2016-03-27 DIAGNOSIS — J9621 Acute and chronic respiratory failure with hypoxia: Secondary | ICD-10-CM | POA: Diagnosis present

## 2016-03-27 DIAGNOSIS — L0390 Cellulitis, unspecified: Secondary | ICD-10-CM

## 2016-03-27 DIAGNOSIS — J189 Pneumonia, unspecified organism: Principal | ICD-10-CM | POA: Diagnosis present

## 2016-03-27 DIAGNOSIS — E785 Hyperlipidemia, unspecified: Secondary | ICD-10-CM | POA: Diagnosis present

## 2016-03-27 DIAGNOSIS — G2581 Restless legs syndrome: Secondary | ICD-10-CM | POA: Diagnosis present

## 2016-03-27 DIAGNOSIS — N185 Chronic kidney disease, stage 5: Secondary | ICD-10-CM | POA: Diagnosis present

## 2016-03-27 DIAGNOSIS — I2510 Atherosclerotic heart disease of native coronary artery without angina pectoris: Secondary | ICD-10-CM | POA: Diagnosis present

## 2016-03-27 DIAGNOSIS — Z885 Allergy status to narcotic agent status: Secondary | ICD-10-CM

## 2016-03-27 DIAGNOSIS — I1311 Hypertensive heart and chronic kidney disease without heart failure, with stage 5 chronic kidney disease, or end stage renal disease: Secondary | ICD-10-CM | POA: Diagnosis present

## 2016-03-27 DIAGNOSIS — L03115 Cellulitis of right lower limb: Secondary | ICD-10-CM | POA: Diagnosis present

## 2016-03-27 DIAGNOSIS — Z881 Allergy status to other antibiotic agents status: Secondary | ICD-10-CM | POA: Diagnosis not present

## 2016-03-27 DIAGNOSIS — J9600 Acute respiratory failure, unspecified whether with hypoxia or hypercapnia: Secondary | ICD-10-CM

## 2016-03-27 LAB — COMPREHENSIVE METABOLIC PANEL
ALBUMIN: 3.6 g/dL (ref 3.5–5.0)
ALT: 10 U/L — ABNORMAL LOW (ref 14–54)
AST: 20 U/L (ref 15–41)
Alkaline Phosphatase: 92 U/L (ref 38–126)
Anion gap: 9 (ref 5–15)
BUN: 27 mg/dL — ABNORMAL HIGH (ref 6–20)
CALCIUM: 9.1 mg/dL (ref 8.9–10.3)
CO2: 32 mmol/L (ref 22–32)
Chloride: 90 mmol/L — ABNORMAL LOW (ref 101–111)
Creatinine, Ser: 0.99 mg/dL (ref 0.44–1.00)
GFR calc non Af Amer: 51 mL/min — ABNORMAL LOW (ref >60)
GFR, EST AFRICAN AMERICAN: 59 mL/min — AB (ref >60)
GLUCOSE: 110 mg/dL — AB (ref 65–99)
POTASSIUM: 2.9 mmol/L — AB (ref 3.5–5.1)
Sodium: 131 mmol/L — ABNORMAL LOW (ref 135–145)
TOTAL PROTEIN: 6.7 g/dL (ref 6.5–8.1)
Total Bilirubin: 0.6 mg/dL (ref 0.3–1.2)

## 2016-03-27 LAB — CBC WITH DIFFERENTIAL/PLATELET
BASOS PCT: 1 %
Basophils Absolute: 0.1 10*3/uL (ref 0–0.1)
Eosinophils Absolute: 0 10*3/uL (ref 0–0.7)
Eosinophils Relative: 0 %
HEMATOCRIT: 33.2 % — AB (ref 35.0–47.0)
Hemoglobin: 10.9 g/dL — ABNORMAL LOW (ref 12.0–16.0)
LYMPHS ABS: 0.9 10*3/uL — AB (ref 1.0–3.6)
Lymphocytes Relative: 8 %
MCH: 27.6 pg (ref 26.0–34.0)
MCHC: 32.7 g/dL (ref 32.0–36.0)
MCV: 84.6 fL (ref 80.0–100.0)
MONOS PCT: 7 %
Monocytes Absolute: 0.8 10*3/uL (ref 0.2–0.9)
NEUTROS ABS: 9.8 10*3/uL — AB (ref 1.4–6.5)
NEUTROS PCT: 84 %
PLATELETS: 284 10*3/uL (ref 150–440)
RBC: 3.92 MIL/uL (ref 3.80–5.20)
RDW: 19 % — ABNORMAL HIGH (ref 11.5–14.5)
WBC: 11.6 10*3/uL — AB (ref 3.6–11.0)

## 2016-03-27 LAB — URINALYSIS, COMPLETE (UACMP) WITH MICROSCOPIC
Bilirubin Urine: NEGATIVE
GLUCOSE, UA: NEGATIVE mg/dL
HGB URINE DIPSTICK: NEGATIVE
Ketones, ur: NEGATIVE mg/dL
NITRITE: NEGATIVE
Protein, ur: NEGATIVE mg/dL
SPECIFIC GRAVITY, URINE: 1.013 (ref 1.005–1.030)
pH: 5 (ref 5.0–8.0)

## 2016-03-27 LAB — PROTIME-INR
INR: 1.04
PROTHROMBIN TIME: 13.6 s (ref 11.4–15.2)

## 2016-03-27 LAB — LACTIC ACID, PLASMA: Lactic Acid, Venous: 1.3 mmol/L (ref 0.5–1.9)

## 2016-03-27 MED ORDER — TEMAZEPAM 15 MG PO CAPS: 15.0000 mg | ORAL_CAPSULE | Freq: Every evening | ORAL | Status: DC | PRN

## 2016-03-27 MED ORDER — CALCIUM CITRATE-VITAMIN D 500-400 MG-UNIT PO CHEW
1.0000 | CHEWABLE_TABLET | Freq: Every day | ORAL | Status: DC
  Administered 2016-03-28: 1 via ORAL
  Filled 2016-03-27 (×2): qty 1

## 2016-03-27 MED ORDER — IOPAMIDOL (ISOVUE-370) INJECTION 76%
75.0000 mL | Freq: Once | INTRAVENOUS | Status: AC | PRN
  Administered 2016-03-27: 75 mL via INTRAVENOUS

## 2016-03-27 MED ORDER — BUDESONIDE 180 MCG/ACT IN AEPB: 2.0000 | INHALATION_SPRAY | Freq: Two times a day (BID) | RESPIRATORY_TRACT | Status: DC

## 2016-03-27 MED ORDER — PANTOPRAZOLE SODIUM 40 MG PO TBEC
40.0000 mg | DELAYED_RELEASE_TABLET | Freq: Every day | ORAL | Status: DC
  Administered 2016-03-28 – 2016-03-31 (×4): 40 mg via ORAL
  Filled 2016-03-27 (×4): qty 1

## 2016-03-27 MED ORDER — POTASSIUM CHLORIDE IN NACL 40-0.9 MEQ/L-% IV SOLN
INTRAVENOUS | Status: AC
  Filled 2016-03-27: qty 1000

## 2016-03-27 MED ORDER — ALBUTEROL SULFATE (2.5 MG/3ML) 0.083% IN NEBU: 3.0000 mL | INHALATION_SOLUTION | Freq: Two times a day (BID) | RESPIRATORY_TRACT | Status: DC | PRN

## 2016-03-27 MED ORDER — POTASSIUM CHLORIDE CRYS ER 20 MEQ PO TBCR
40.0000 meq | EXTENDED_RELEASE_TABLET | Freq: Every day | ORAL | Status: DC
  Administered 2016-03-28 – 2016-03-31 (×4): 40 meq via ORAL
  Filled 2016-03-27 (×4): qty 2

## 2016-03-27 MED ORDER — IPRATROPIUM-ALBUTEROL 0.5-2.5 (3) MG/3ML IN SOLN
3.0000 mL | Freq: Four times a day (QID) | RESPIRATORY_TRACT | Status: DC
  Administered 2016-03-27 – 2016-03-29 (×8): 3 mL via RESPIRATORY_TRACT
  Filled 2016-03-27 (×8): qty 3

## 2016-03-27 MED ORDER — MONTELUKAST SODIUM 10 MG PO TABS
10.0000 mg | ORAL_TABLET | ORAL | Status: DC
  Administered 2016-03-28 – 2016-03-31 (×4): 10 mg via ORAL
  Filled 2016-03-27 (×4): qty 1

## 2016-03-27 MED ORDER — ASPIRIN EC 325 MG PO TBEC
325.0000 mg | DELAYED_RELEASE_TABLET | Freq: Every day | ORAL | Status: DC
  Administered 2016-03-27 – 2016-03-30 (×4): 325 mg via ORAL
  Filled 2016-03-27 (×4): qty 1

## 2016-03-27 MED ORDER — ONDANSETRON HCL 4 MG PO TABS: 4.0000 mg | ORAL_TABLET | Freq: Four times a day (QID) | ORAL | Status: DC | PRN

## 2016-03-27 MED ORDER — PIPERACILLIN-TAZOBACTAM 3.375 G IVPB
3.3750 g | Freq: Three times a day (TID) | INTRAVENOUS | Status: DC
  Administered 2016-03-27 – 2016-03-31 (×12): 3.375 g via INTRAVENOUS
  Filled 2016-03-27 (×12): qty 50

## 2016-03-27 MED ORDER — VANCOMYCIN HCL IN DEXTROSE 750-5 MG/150ML-% IV SOLN
750.0000 mg | INTRAVENOUS | Status: DC
  Administered 2016-03-28 – 2016-03-31 (×4): 750 mg via INTRAVENOUS
  Filled 2016-03-27 (×4): qty 150

## 2016-03-27 MED ORDER — HEPARIN SODIUM (PORCINE) 5000 UNIT/ML IJ SOLN
5000.0000 [IU] | Freq: Three times a day (TID) | INTRAMUSCULAR | Status: DC
  Administered 2016-03-27 – 2016-03-31 (×10): 5000 [IU] via SUBCUTANEOUS
  Filled 2016-03-27 (×10): qty 1

## 2016-03-27 MED ORDER — PRAMIPEXOLE DIHYDROCHLORIDE 0.25 MG PO TABS
0.5000 mg | ORAL_TABLET | Freq: Two times a day (BID) | ORAL | Status: DC
  Administered 2016-03-27 – 2016-03-31 (×8): 0.5 mg via ORAL
  Filled 2016-03-27 (×9): qty 2

## 2016-03-27 MED ORDER — POTASSIUM CHLORIDE CRYS ER 20 MEQ PO TBCR: 20.0000 meq | EXTENDED_RELEASE_TABLET | ORAL | Status: DC

## 2016-03-27 MED ORDER — GABAPENTIN 300 MG PO CAPS
300.0000 mg | ORAL_CAPSULE | Freq: Two times a day (BID) | ORAL | Status: DC
  Administered 2016-03-27 – 2016-03-31 (×8): 300 mg via ORAL
  Filled 2016-03-27 (×8): qty 1

## 2016-03-27 MED ORDER — PIPERACILLIN-TAZOBACTAM 3.375 G IVPB 30 MIN
3.3750 g | Freq: Once | INTRAVENOUS | Status: AC
  Administered 2016-03-27: 3.375 g via INTRAVENOUS
  Filled 2016-03-27: qty 50

## 2016-03-27 MED ORDER — POTASSIUM CHLORIDE CRYS ER 20 MEQ PO TBCR
20.0000 meq | EXTENDED_RELEASE_TABLET | Freq: Every day | ORAL | Status: DC
  Administered 2016-03-27: 20 meq via ORAL
  Filled 2016-03-27: qty 1

## 2016-03-27 MED ORDER — OXYCODONE HCL 5 MG PO TABS
5.0000 mg | ORAL_TABLET | ORAL | Status: DC | PRN
  Administered 2016-03-27 – 2016-03-30 (×11): 5 mg via ORAL
  Filled 2016-03-27 (×11): qty 1

## 2016-03-27 MED ORDER — SODIUM CHLORIDE 0.9% FLUSH
3.0000 mL | Freq: Two times a day (BID) | INTRAVENOUS | Status: DC
  Administered 2016-03-27 – 2016-03-31 (×7): 3 mL via INTRAVENOUS

## 2016-03-27 MED ORDER — POTASSIUM CHLORIDE CRYS ER 20 MEQ PO TBCR
40.0000 meq | EXTENDED_RELEASE_TABLET | Freq: Once | ORAL | Status: AC
  Administered 2016-03-27: 40 meq via ORAL
  Filled 2016-03-27: qty 2

## 2016-03-27 MED ORDER — MELATONIN 5 MG PO TABS
5.0000 mg | ORAL_TABLET | Freq: Every day | ORAL | Status: DC
  Administered 2016-03-27 – 2016-03-30 (×4): 5 mg via ORAL
  Filled 2016-03-27 (×5): qty 1

## 2016-03-27 MED ORDER — DOCUSATE SODIUM 100 MG PO CAPS
400.0000 mg | ORAL_CAPSULE | Freq: Every day | ORAL | Status: DC
  Administered 2016-03-27: 400 mg via ORAL
  Filled 2016-03-27: qty 4

## 2016-03-27 MED ORDER — BISACODYL 10 MG RE SUPP: 10.0000 mg | Freq: Every day | RECTAL | Status: DC | PRN

## 2016-03-27 MED ORDER — FUROSEMIDE 20 MG PO TABS
20.0000 mg | ORAL_TABLET | Freq: Two times a day (BID) | ORAL | Status: DC
  Administered 2016-03-27 – 2016-03-31 (×8): 20 mg via ORAL
  Filled 2016-03-27 (×8): qty 1

## 2016-03-27 MED ORDER — VITAMIN D 1000 UNITS PO TABS
1000.0000 [IU] | ORAL_TABLET | ORAL | Status: DC
  Administered 2016-03-28 – 2016-03-31 (×4): 1000 [IU] via ORAL
  Filled 2016-03-27 (×4): qty 1

## 2016-03-27 MED ORDER — ACETAMINOPHEN 650 MG RE SUPP: 650.0000 mg | Freq: Four times a day (QID) | RECTAL | Status: DC | PRN

## 2016-03-27 MED ORDER — DILTIAZEM HCL ER COATED BEADS 180 MG PO CP24
180.0000 mg | ORAL_CAPSULE | ORAL | Status: DC
  Administered 2016-03-28 – 2016-03-31 (×4): 180 mg via ORAL
  Filled 2016-03-27 (×4): qty 1

## 2016-03-27 MED ORDER — POTASSIUM CHLORIDE CRYS ER 20 MEQ PO TBCR
40.0000 meq | EXTENDED_RELEASE_TABLET | Freq: Every day | ORAL | Status: DC
  Administered 2016-03-27: 40 meq via ORAL
  Filled 2016-03-27: qty 2

## 2016-03-27 MED ORDER — ACETAMINOPHEN 325 MG PO TABS: 650.0000 mg | ORAL_TABLET | Freq: Four times a day (QID) | ORAL | Status: DC | PRN

## 2016-03-27 MED ORDER — VANCOMYCIN HCL IN DEXTROSE 1-5 GM/200ML-% IV SOLN
1000.0000 mg | Freq: Once | INTRAVENOUS | Status: AC
  Administered 2016-03-27: 1000 mg via INTRAVENOUS
  Filled 2016-03-27: qty 200

## 2016-03-27 MED ORDER — BUDESONIDE 0.5 MG/2ML IN SUSP
0.5000 mg | Freq: Two times a day (BID) | RESPIRATORY_TRACT | Status: DC
  Administered 2016-03-27 – 2016-03-31 (×8): 0.5 mg via RESPIRATORY_TRACT
  Filled 2016-03-27 (×8): qty 2

## 2016-03-27 MED ORDER — POTASSIUM CHLORIDE IN NACL 40-0.9 MEQ/L-% IV SOLN
INTRAVENOUS | Status: DC
  Administered 2016-03-27: 75 mL/h via INTRAVENOUS
  Filled 2016-03-27 (×2): qty 1000

## 2016-03-27 MED ORDER — SIMVASTATIN 20 MG PO TABS
20.0000 mg | ORAL_TABLET | Freq: Every day | ORAL | Status: DC
  Administered 2016-03-27 – 2016-03-30 (×4): 20 mg via ORAL
  Filled 2016-03-27 (×5): qty 1

## 2016-03-27 MED ORDER — SODIUM CHLORIDE 0.9 % IV BOLUS (SEPSIS)
500.0000 mL | Freq: Once | INTRAVENOUS | Status: DC
Start: 2016-03-27 — End: 2016-03-27

## 2016-03-27 MED ORDER — ONDANSETRON HCL 4 MG/2ML IJ SOLN: 4.0000 mg | Freq: Four times a day (QID) | INTRAMUSCULAR | Status: DC | PRN

## 2016-03-27 MED ORDER — CLOPIDOGREL BISULFATE 75 MG PO TABS
75.0000 mg | ORAL_TABLET | Freq: Every day | ORAL | Status: DC
Start: 2016-03-28 — End: 2016-03-31
  Administered 2016-03-28 – 2016-03-31 (×4): 75 mg via ORAL
  Filled 2016-03-27 (×4): qty 1

## 2016-03-27 NOTE — H&P (Signed)
History and Physical    Natasha [name] PJA:[id number] DOB: August 06, 1928 DOA: 03/27/2016  Referring physician: Dr. Burlene Arnt PCP: Rusty Aus, MD  Specialists: none  Chief Complaint: leg pain  HPI: Natasha [name] is a 83 y.o. female has a past medical history significant for chronic resp failure, OA, and PVD now with RLE pain and redness. In ER, pt noted to be profoundly hypoxic. CXR reveals RLL pneumonia. Also noted to have RLE cellulitis. She is now admitted. No N/V/D. Denies CP. Has had some cough.  Review of Systems: The patient denies anorexia, weight loss,, vision loss, decreased hearing, hoarseness, chest pain, syncope, dyspnea on exertion, peripheral edema, balance deficits, hemoptysis, abdominal pain, melena, hematochezia, severe indigestion/heartburn, hematuria, incontinence, genital sores, muscle weakness, suspicious skin lesions, transient blindness,  depression, unusual weight change, abnormal bleeding, enlarged lymph nodes, angioedema, and breast masses.   Past Medical History:  Diagnosis Date  . Anemia   . Arthritis   . Asthma   . Bilateral pneumonia may 2017  . Bronchitis   . Cardiomegaly   . Chronic respiratory failure with hypoxia ([place])   . Cochlear implant in place    bilateral, [name], 04/26/2007   . Gallstones 2015  . GERD (gastroesophageal reflux disease)   . H/O blood clots 2014  . History of home oxygen therapy    at night  . Hypertension   . Hypokalemia   . Peripheral vascular disease ([place])    with arterial clots- on xarelto  . Polyneuropathy ([place])   . Renal disorder    Chronic Kidney Disease, Stage 5  . Restless leg syndrome    Past Surgical History:  Procedure Laterality Date  . ABDOMINAL HYSTERECTOMY  1960  . APPENDECTOMY  1946  . BREAST BIOPSY Bilateral    negative  . CHOLECYSTECTOMY  04-24-13  . COCHLEAR IMPLANT  2009  . COLONOSCOPY  2015   Dr. Rayann Heman   . EYE SURGERY  2008,2009   cataract  . KYPHOPLASTY N/A 08/19/2015    Procedure: KYPHOPLASTY;  Surgeon: Hessie Knows, MD;  Location: [facility];  Service: Orthopedics;  Laterality: N/A;  . PERIPHERAL VASCULAR CATHETERIZATION N/A 02/27/2015   Procedure: Abdominal Aortogram w/Lower Extremity;  Surgeon: Algernon Huxley, MD;  Location: [place] [facility];  Service: Cardiovascular;  Laterality: N/A;  . PERIPHERAL VASCULAR CATHETERIZATION  02/27/2015   Procedure: Lower Extremity Intervention;  Surgeon: Algernon Huxley, MD;  Location: [place] [facility];  Service: Cardiovascular;;  . PERIPHERAL VASCULAR CATHETERIZATION Left 02/28/2015   Procedure: Lower Extremity Angiography;  Surgeon: Algernon Huxley, MD;  Location: [place] [facility];  Service: Cardiovascular;  Laterality: Left;  . PERIPHERAL VASCULAR CATHETERIZATION  02/28/2015   Procedure: Lower Extremity Intervention;  Surgeon: Algernon Huxley, MD;  Location: [place] [facility];  Service: Cardiovascular;;  . PERIPHERAL VASCULAR CATHETERIZATION Left 05/22/2015   Procedure: Lower Extremity Angiography;  Surgeon: Algernon Huxley, MD;  Location: [place] [facility];  Service: Cardiovascular;  Laterality: Left;  . PERIPHERAL VASCULAR CATHETERIZATION  05/22/2015   Procedure: Lower Extremity Intervention;  Surgeon: Algernon Huxley, MD;  Location: [place] [facility];  Service: Cardiovascular;;  . PERIPHERAL VASCULAR CATHETERIZATION Left 05/23/2015   Procedure: Lower Extremity Angiography;  Surgeon: Algernon Huxley, MD;  Location: [place] [facility];  Service: Cardiovascular;  Laterality: Left;  . PERIPHERAL VASCULAR CATHETERIZATION  05/23/2015   Procedure: Lower Extremity Intervention;  Surgeon: Algernon Huxley, MD;  Location: [place] [facility];  Service: Cardiovascular;;  . stent placement  2006-2014   multiple stent placements in legs   Social History:  reports that she has quit smoking. Her smoking use included Cigarettes. She has a 25.00 pack-year smoking history. She has never used smokeless tobacco. She reports that she does not drink alcohol  or use drugs.  Allergies  Allergen Reactions  . Codeine Diarrhea  . Hydrocodone Nausea And Vomiting  . Levofloxacin Other (See Comments)    Muscle cramps  . Tramadol Nausea And Vomiting    Family History  Problem Relation Age of Onset  . Heart disease Mother   . Heart disease Father   . Cancer Sister     lung  . Breast cancer Daughter     Prior to Admission medications   Medication Sig Start Date End Date Taking? Authorizing Provider  albuterol (PROVENTIL HFA;VENTOLIN HFA) 108 (90 BASE) MCG/ACT inhaler Inhale 2 puffs into the lungs 2 (two) times daily as needed for wheezing or shortness of breath.    Yes Historical Provider, MD  albuterol (PROVENTIL) (2.5 MG/3ML) 0.083% nebulizer solution Take 3 mLs (2.5 mg total) by nebulization every 6 (six) hours as needed for wheezing or shortness of breath. 06/11/15  Yes Loletha Grayer, MD  aspirin EC 325 MG tablet Take 325 mg by mouth at bedtime.    Yes Historical Provider, MD  budesonide (PULMICORT) 180 MCG/ACT inhaler Inhale 2 puffs into the lungs 2 (two) times daily.   Yes Historical Provider, MD  Calcium Carbonate-Vit D-Min (CALCIUM 1200) 1200-1000 MG-UNIT CHEW Chew 1 tablet by mouth daily.   Yes Historical Provider, MD  cholecalciferol (VITAMIN D) 1000 units tablet Take 1,000 Units by mouth every morning.    Yes Historical Provider, MD  clopidogrel (PLAVIX) 75 MG tablet Take 75 mg by mouth daily.   Yes Historical Provider, MD  diltiazem (CARDIZEM CD) 180 MG 24 hr capsule Take 180 mg by mouth every morning.    Yes Historical Provider, MD  Esomeprazole Magnesium (NEXIUM PO) Take 22.3 mg by mouth 2 (two) times daily.    Yes Historical Provider, MD  furosemide (LASIX) 20 MG tablet Take 20 mg by mouth 2 (two) times daily.   Yes Historical Provider, MD  gabapentin (NEURONTIN) 300 MG capsule Take 300 mg by mouth 2 (two) times daily.   Yes Historical Provider, MD  Melatonin 5 MG TABS Take 5 mg by mouth at bedtime.    Yes Historical Provider, MD   montelukast (SINGULAIR) 10 MG tablet Take 10 mg by mouth every morning.    Yes Historical Provider, MD  oxyCODONE (ROXICODONE) 5 MG immediate release tablet Take 1 tablet (5 mg total) by mouth every 4 (four) hours as needed for severe pain. Patient taking differently: Take 2.5 mg by mouth every 4 (four) hours as needed for severe pain.  08/19/15  Yes Hessie Knows, MD  potassium chloride SA (K-DUR,KLOR-CON) 20 MEQ tablet Take 20-40 mEq by mouth See admin instructions. Take 2 tablets (40 mEq) by mouth every morning, Take 20 mEq by mouth every afternoon, and Take 2 tablets (40 mEq) by mouth every night at bedtime.   Yes Historical Provider, MD  pramipexole (MIRAPEX) 0.5 MG tablet Take 0.5 mg by mouth 2 (two) times daily. 06/30/15  Yes Historical Provider, MD  simvastatin (ZOCOR) 20 MG tablet Take 20 mg by mouth at bedtime. 07/03/15  Yes Historical Provider, MD  amoxicillin-clavulanate (AUGMENTIN) 875-125 MG tablet Take 1 tablet by mouth every 12 (twelve) hours. Patient not taking: Reported on  03/27/2016 02/02/16   Loletha Grayer, MD  docusate sodium (COLACE) 100 MG capsule Take 400 mg by mouth at bedtime.    Historical Provider, MD  ferrous sulfate 325 (65 FE) MG tablet Take 1 tablet (325 mg total) by mouth daily. Patient not taking: Reported on 03/27/2016 05/24/15   Janalyn Harder Stegmayer, PA-C  polyethylene glycol (MIRALAX / GLYCOLAX) packet Take 17 g by mouth daily as needed. Patient not taking: Reported on 03/27/2016 06/11/15   Loletha Grayer, MD  temazepam (RESTORIL) 15 MG capsule Take 15 mg by mouth at bedtime as needed for sleep.    Historical Provider, MD   Physical Exam: Vitals:   03/27/16 1139 03/27/16 1200 03/27/16 1230 03/27/16 1300  BP:   (!) 114/40 101/63  Pulse:  76 67 72  Resp:  18 18 (!) 21  Temp:      TempSrc:      SpO2:  93% 93% 94%  Weight: 63 kg (139 lb)        General:  No apparent distress, WDWN, [place]/AT  Eyes: PERRL, EOMI, no scleral icterus, conjunctiva clear  ENT: moist  oropharynx without exudate, TM's benign, dentition fair  Neck: supple, no lymphadenopathy. No bruits or thyromegaly  Cardiovascular: regular rate without MRG; 2+ peripheral pulses, no JVD, 1+ peripheral edema  Respiratory: diffuse rhonchi with dullness at the right base. No wheezes or rales. Respiratory effort increased  Abdomen: soft, non tender to palpation, positive bowel sounds, no guarding, no rebound  Skin: RLE red/warm/swollen.  Musculoskeletal: normal bulk and tone, no joint swelling  Psychiatric: normal mood and affect,, A&OX3  Neurologic: CN 2-12 grossly intact, Motor strength 5/5 in all 4 groups with symmetric DTR's and non-focal sensory exam  Labs on Admission:  Basic Metabolic Panel:  Recent Labs Lab 03/27/16 1148  NA 131*  K 2.9*  CL 90*  CO2 32  GLUCOSE 110*  BUN 27*  CREATININE 0.99  CALCIUM 9.1   Liver Function Tests:  Recent Labs Lab 03/27/16 1148  AST 20  ALT 10*  ALKPHOS 92  BILITOT 0.6  PROT 6.7  ALBUMIN 3.6   No results for input(s): LIPASE, AMYLASE in the last 168 hours. No results for input(s): AMMONIA in the last 168 hours. CBC:  Recent Labs Lab 03/27/16 1148  WBC 11.6*  NEUTROABS 9.8*  HGB 10.9*  HCT 33.2*  MCV 84.6  PLT 284   Cardiac Enzymes: No results for input(s): CKTOTAL, CKMB, CKMBINDEX, TROPONINI in the last 168 hours.  BNP (last 3 results)  Recent Labs  06/07/15 1551 08/04/15 1251  BNP 81.0 343.1*    ProBNP (last 3 results) No results for input(s): PROBNP in the last 8760 hours.  CBG: No results for input(s): GLUCAP in the last 168 hours.  Radiological Exams on Admission: Dg Chest Portable 1 View  Result Date: 03/27/2016 CLINICAL DATA:  Hypoxia. EXAM: PORTABLE CHEST 1 VIEW COMPARISON:  01/30/2016.  Chest CT dated 03/03/2015. FINDINGS: Enlarged cardiac silhouette with improvement. Large hiatal hernia similar to the previous CT. Mild patchy airspace opacity throughout the majority of the right lung and  mild patchy and linear opacity at the left lung base. The interstitial markings remain prominent. No pleural fluid seen. Diffuse osteopenia. Old left clavicle fracture. IMPRESSION: 1. Mildly progressive probable pneumonia in the right lung. 2. Mild patchy and linear atelectasis at the left lung base with improvement. 3. Stable cardiomegaly and chronic interstitial lung disease. 4. Stable large hiatal hernia. Electronically Signed   By: Percell Locus.D.  On: 03/27/2016 12:12    EKG: Independently reviewed.  Assessment/Plan Principal Problem:   Acute respiratory failure (HCC) Active Problems:   CAP (community acquired pneumonia)   Cellulitis   Hypokalemia   Will admit to floor with IV ABX, O2, and SVN's. CT chest pending. Follow LE cellulitis closely. Supplement K+. Repeat labs in AM  Diet: low salt Fluids: NS with K+@75  DVT Prophylaxis: SQ Heparin  Code Status: FULL  Family Communication: none  Disposition Plan: home  Time spent: 55 min

## 2016-03-27 NOTE — ED Triage Notes (Signed)
Started with right foot pain today. Hx bypass in left leg for clot per pt. Right ankle/calf warm to touch. Fever 100.8 at home and took tylenol before coming. sats 79% here room air, only wears O2 at home sometimes. Placed on 4 L [place]

## 2016-03-27 NOTE — Progress Notes (Addendum)
ZYLIE, MUMAW ([id number]) Visit Report for 03/26/2016 Chief Complaint Document Details Patient Name: Natasha [name], Natasha [name] Date of Service: 03/26/2016 2:30 PM Medical Record Number: [id number] Patient Account Number: [id number] Date of Birth/Sex: [birth year] (83 y.o. Female) Treating RN: Montey Hora Primary Care Provider: Emily Filbert Other Clinician: Referring Provider: Emily Filbert Treating Provider/Extender: Frann Rider in Treatment: 9 Information Obtained from: Patient Chief Complaint Patient presents to the wound care center today with an open arterial ulcer along with diabetes mellitus to the left lower extremity which she's had for over a month Electronic Signature(s) Signed: 03/26/2016 3:03:29 PM By: Christin Fudge MD, FACS Entered By: Christin Fudge on 03/26/2016 15:03:29 Natasha [name] ([id number]) -------------------------------------------------------------------------------- Debridement Details Patient Name: Natasha [name] Date of Service: 03/26/2016 2:30 PM Medical Record Number: [id number] Patient Account Number: [id number] Date of Birth/Sex: 1928-08-21 (83 y.o. Female) Treating RN: Cornell Barman Primary Care Provider: Emily Filbert Other Clinician: Referring Provider: Emily Filbert Treating Provider/Extender: Frann Rider in Treatment: 9 Debridement Performed for Wound #3 Left,Distal Lower Leg Assessment: Performed By: Physician Christin Fudge, MD Debridement: Debridement Pre-procedure Yes - 14:54 Verification/Time Out Taken: Start Time: 14:54 Pain Control: Lidocaine 4% Topical Solution Level: Skin/Subcutaneous Tissue Total Area Debrided (L x 2.1 (cm) x 0.8 (cm) = 1.68 (cm) W): Tissue and other Viable, Non-Viable, Fibrin/Slough, Subcutaneous material debrided: Instrument: Curette Bleeding: Minimum Hemostasis Achieved: Pressure End Time: 14:57 Procedural Pain: 0 Post Procedural Pain: 0 Response to Treatment: Procedure was  tolerated well Post Debridement Measurements of Total Wound Length: (cm) 2.1 Width: (cm) 0.8 Depth: (cm) 0.2 Volume: (cm) 0.264 Character of Wound/Ulcer Post Improved Debridement: Severity of Tissue Post Debridement: Fat layer exposed Post Procedure Diagnosis Same as Pre-procedure Electronic Signature(s) Signed: 04/28/2016 2:34:44 PM By: [name], RN, [name], [name] RN, [name] Signed: 05/11/2016 12:41:57 PM By: Christin Fudge MD, FACS Previous Signature: 04/21/2016 11:29:18 AM Version By: [name] RN, [name], [name] RN, [name] Previous Signature: 04/22/2016 2:17:43 PM Version By: Christin Fudge MD, FACS Previous Signature: 03/26/2016 3:03:22 PM Version By: Christin Fudge MD, FACS BOBETTE, LEYH ([id number]) Previous Signature: 03/26/2016 4:31:35 PM Version By: Montey Hora Entered By: [name] RN, [name], [name] on 04/28/2016 14:34:44 ENRIQUETA, AUGUSTA ([id number]) -------------------------------------------------------------------------------- HPI Details Patient Name: Natasha [name] Date of Service: 03/26/2016 2:30 PM Medical Record Number: [id number] Patient Account Number: [id number] Date of Birth/Sex: Apr 16, 1928 (83 y.o. Female) Treating RN: Montey Hora Primary Care Provider: Emily Filbert Other Clinician: Referring Provider: Emily Filbert Treating Provider/Extender: Frann Rider in Treatment: 9 History of Present Illness Location: left thigh and left calf in the area where there was previous vascular surgery Quality: Patient reports experiencing a dull pain to affected area(s). Severity: Patient states wound (s) are getting better. Duration: Patient has had the wound for > 2 months prior to seeking treatment at the wound center Timing: Pain in wound is Intermittent (comes and goes Context: The wound occurred when the patient had vascular surgery and the wounds were not healing very well Modifying Factors: Other treatment(s) tried include:local care and oral antibiotics Associated  Signs and Symptoms: Patient reports having increase swelling. HPI Description: 83 year old female with significant past medical history of anemia, cellulitis and abscess of the leg, COPD, coronary artery disease, hypertension, osteoarthritis, peripheral vascular disease with claudication, trigeminal neuralgia and type 2 diabetes mellitus without complications. A past surgical history significant for appendectomy, left femoral o peroneal bypass graft in October 2017, and debridement of skin and subcutis tissue on the left side in December 2017. She was treated by  Dr. Lucky Cowboy earlier, with multiple left leg revascularization both endovascular and open. She is a former smoker who quit 30 years ago. When she was last seen at [facility], at the vascular surgery clinic she had a left lower extremity 3 separate wounds along the saphenectomy site. She was placed on Bactrim DS for 10 days. She had a good signal over the peroneal artery and the bypass remains patent. Prior to surgery her left ABI was 0.46 and a right was 0.98. Note the patient was seen by Dr. Amalia Hailey of podiatry on 01/15/2016 and he saw for ulceration of her left great toe. Most recently the patient was seen by Dr. Maura Crandall, the vascular surgeon on January 5 and these notes are reviewed on Epic-- he noted that the 3 wounds on the left lower extremity are granulation very slowly, and he recommended packing with wet-to-dry Kerlix. The surgeon noted excellent signal over the peroneal artery and the recommendation was to see as at the wound center to help with healing. 02/12/2016 -- we have not seen her for 2 weeks and her husband tells me that she was admitted to the hospital with a pneumonia. I have reviewed a hospital notes, she was there between January 19 and January 22, with clinical sepsis and bilateral pneumonia, fever and leukocytosis and was treated with vancomycin and Zosyn. Vancomycin was then stopped and Zithromax was added. He was  discharged home on Augmentin 02/23/2016 -- she had a arterial duplex study done recently at [facility] and her right ABI was 0.92 and the left ABI was 0.96. The femoral to peroneal bypass is widely patent. she was also seen by the surgeon who noted a postoperative seroma in the left groin. she was asked to return in 3 months for duplex examination 03/01/16 -she has got a lot of lymphedema for left lower extremity and there is weeping from the lower lateral leg. She had been walking around a lot yesterday as she had gone shopping. HALIA, FRANEY ([id number]) 03/08/2016 -- and did used to have a lot of lymphedema for left lower extremity but does not have much pain. Electronic Signature(s) Signed: 03/26/2016 3:03:34 PM By: Christin Fudge MD, FACS Entered By: Christin Fudge on 03/26/2016 15:03:34 Natasha [name] ([id number]) -------------------------------------------------------------------------------- Physical Exam Details Patient Name: Natasha [name] Date of Service: 03/26/2016 2:30 PM Medical Record Number: [id number] Patient Account Number: [id number] Date of Birth/Sex: 06/08/1928 (83 y.o. Female) Treating RN: Montey Hora Primary Care Provider: Emily Filbert Other Clinician: Referring Provider: Emily Filbert Treating Provider/Extender: Frann Rider in Treatment: 9 Constitutional . Pulse regular. Respirations normal and unlabored. Afebrile. . Eyes Nonicteric. Reactive to light. Ears, Nose, Mouth, and Throat Lips, teeth, and gums WNL.Marland Kitchen Moist mucosa without lesions. Neck supple and nontender. No palpable supraclavicular or cervical adenopathy. Normal sized without goiter. Respiratory WNL. No retractions.. Breath sounds WNL, No rubs, rales, rhonchi, or wheeze.. Cardiovascular Heart rhythm and rate regular, no murmur or gallop.. Pedal Pulses WNL. No clubbing, cyanosis or edema. Chest Breasts symmetical and no nipple discharge.. Breast tissue WNL, no masses,  lumps, or tenderness.. Lymphatic No adneopathy. No adenopathy. No adenopathy. Musculoskeletal Adexa without tenderness or enlargement.. Digits and nails w/o clubbing, cyanosis, infection, petechiae, ischemia, or inflammatory conditions.. Integumentary (Hair, Skin) No suspicious lesions. No crepitus or fluctuance. No peri-wound warmth or erythema. No masses.Marland Kitchen Psychiatric Judgement and insight Intact.. No evidence of depression, anxiety, or agitation.. Notes the wound on the thigh has closed except for a small area inferiorly which will only  admit her Q-tip. The wound on the left calf has minimal debris which was removed with a #3 curet and this is more superficial. Electronic Signature(s) Signed: 03/26/2016 3:04:13 PM By: Christin Fudge MD, FACS Entered By: Christin Fudge on 03/26/2016 15:04:13 Natasha [name] ([id number]) -------------------------------------------------------------------------------- Physician Orders Details Patient Name: Natasha [name] Date of Service: 03/26/2016 2:30 PM Medical Record Number: [id number] Patient Account Number: [id number] Date of Birth/Sex: 06-Aug-1928 (83 y.o. Female) Treating RN: Montey Hora Primary Care Provider: Emily Filbert Other Clinician: Referring Provider: Emily Filbert Treating Provider/Extender: Frann Rider in Treatment: 9 Verbal / Phone Orders: No Diagnosis Coding Wound Cleansing Wound #1 Left,Medial Upper Leg o Clean wound with Normal Saline. o Cleanse wound with mild soap and water Wound #3 Left,Distal Lower Leg o Clean wound with Normal Saline. o Cleanse wound with mild soap and water Anesthetic Wound #1 Left,Medial Upper Leg o Topical Lidocaine 4% cream applied to wound bed prior to debridement - for clinic use Wound #3 Left,Distal Lower Leg o Topical Lidocaine 4% cream applied to wound bed prior to debridement - for clinic use Skin Barriers/Peri-Wound Care Wound #1 Left,Medial Upper Leg o  Skin Prep Wound #3 Left,Distal Lower Leg o Skin Prep Primary Wound Dressing Wound #1 Left,Medial Upper Leg o Iodoform packing Gauze Wound #3 Left,Distal Lower Leg o Medihoney gel Secondary Dressing Wound #1 Left,Medial Upper Leg o Dry Gauze o Boardered Foam Dressing Wound #3 Left,Distal Lower Leg JAYDENCE, VANYO. ([id number]) o ABD and Kerlix/Conform Dressing Change Frequency Wound #1 Left,Medial Upper Leg o Change dressing every other day. Wound #3 Left,Distal Lower Leg o Change dressing every other day. Follow-up Appointments Wound #1 Left,Medial Upper Leg o Return Appointment in 1 week. Wound #3 Left,Distal Lower Leg o Return Appointment in 1 week. Edema Control Wound #1 Left,Medial Upper Leg o Elevate legs to the level of the heart and pump ankles as often as possible Wound #3 Left,Distal Lower Leg o Elevate legs to the level of the heart and pump ankles as often as possible Additional Orders / Instructions Wound #1 Left,Medial Upper Leg o Increase protein intake. Wound #3 Left,Distal Lower Leg o Increase protein intake. Home Health Wound #1 Left,Medial Upper Leg o Continue Home Health Visits o Home Health Nurse may visit PRN to address patientos wound care needs. o FACE TO FACE ENCOUNTER: MEDICARE and MEDICAID PATIENTS: I certify that this patient is under my care and that I had a face-to-face encounter that meets the physician face-to-face encounter requirements with this patient on this date. The encounter with the patient was in whole or in part for the following MEDICAL CONDITION: (primary reason for [place]) MEDICAL NECESSITY: I certify, that based on my findings, NURSING services are a medically necessary home health service. HOME BOUND STATUS: I certify that my clinical findings support that this patient is homebound (i.e., Due to illness or injury, pt requires aid of supportive devices such as crutches, cane,  wheelchairs, walkers, the use of special transportation or the assistance of another person to leave their place of residence. There is a normal inability to leave the home and doing so requires considerable and taxing effort. Other absences are for medical reasons / religious services and are infrequent or of short duration when for other reasons). CIRE, DEYARMIN ([id number]) o If current dressing causes regression in wound condition, may D/C ordered dressing product/s and apply Normal Saline Moist Dressing daily until next [place] / Other MD appointment. [place] of  regression in wound condition at [phone]. o Please direct any NON-WOUND related issues/requests for orders to patient's Primary Care Physician Wound #3 Left,Distal Lower Leg o [place] Nurse may visit PRN to address patientos wound care needs. o FACE TO FACE ENCOUNTER: MEDICARE and MEDICAID PATIENTS: I certify that this patient is under my care and that I had a face-to-face encounter that meets the physician face-to-face encounter requirements with this patient on this date. The encounter with the patient was in whole or in part for the following MEDICAL CONDITION: (primary reason for [place]) MEDICAL NECESSITY: I certify, that based on my findings, NURSING services are a medically necessary home health service. HOME BOUND STATUS: I certify that my clinical findings support that this patient is homebound (i.e., Due to illness or injury, pt requires aid of supportive devices such as crutches, cane, wheelchairs, walkers, the use of special transportation or the assistance of another person to leave their place of residence. There is a normal inability to leave the home and doing so requires considerable and taxing effort. Other absences are for medical reasons / religious services and are infrequent or of short duration when for other  reasons). o If current dressing causes regression in wound condition, may D/C ordered dressing product/s and apply Normal Saline Moist Dressing daily until next [place] / Other MD appointment. [place] of regression in wound condition at [phone]. o Please direct any NON-WOUND related issues/requests for orders to patient's Primary Care Physician Electronic Signature(s) Signed: 03/26/2016 3:55:11 PM By: Christin Fudge MD, FACS Signed: 03/26/2016 4:31:35 PM By: Montey Hora Entered By: Montey Hora on 03/26/2016 14:58:09 Natasha [name] ([id number]) -------------------------------------------------------------------------------- Problem List Details Patient Name: Natasha [name] Date of Service: 03/26/2016 2:30 PM Medical Record Number: [id number] Patient Account Number: [id number] Date of Birth/Sex: 01-10-1929 (83 y.o. 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 Female) Treating RN: Montey Hora Primary Care Provider: Emily Filbert Other Clinician: Referring Provider: Emily Filbert Treating Provider/Extender: Frann Rider in Treatment: 9 Subjective Chief Complaint Information obtained from Patient Patient presents to the wound care center today with an open arterial ulcer along with diabetes mellitus to the left lower extremity which she's had for over a month History of Present Illness (HPI) The following HPI elements were documented for the patient's wound: Location: left thigh and left calf in the area where there was previous vascular surgery Quality: Patient reports experiencing a dull pain to affected area(s). Severity: Patient states wound (s) are getting better. Duration: Patient has had the wound for > 2 months prior to seeking treatment at the wound center Timing: Pain in wound is Intermittent (comes and goes Context: The wound occurred when the patient had  vascular surgery and the wounds were not healing very well Modifying Factors: Other treatment(s) tried include:local care and oral antibiotics Associated Signs and Symptoms: Patient reports having increase swelling. 83 year old female with significant past medical history of anemia, cellulitis and abscess of the leg, COPD, coronary artery disease, hypertension, osteoarthritis, peripheral vascular disease with claudication, trigeminal neuralgia and type 2 diabetes mellitus without complications. A past surgical history significant for appendectomy, left femoral peroneal bypass graft in October 2017, and debridement of skin and subcutis tissue on the left side in December 2017. She was treated by Dr. Lucky Cowboy earlier, with multiple left leg revascularization both endovascular and open. She is a former smoker who quit 30 years ago. When she was last seen at [facility], at the vascular surgery clinic she had a left lower extremity 3 separate wounds along  the saphenectomy site. She was placed on Bactrim DS for 10 days. She had a good signal over the peroneal artery and the bypass remains patent. Prior to surgery her left ABI was 0.46 and a right was 0.98. Note the patient was seen by Dr. Amalia Hailey of podiatry on 01/15/2016 and he saw for ulceration of her left great toe. Most recently the patient was seen by Dr. Maura Crandall, the vascular surgeon on January 5 and these notes are reviewed on Epic-- he noted that the 3 wounds on the left lower extremity are granulation very slowly, and he recommended packing with wet-to-dry Kerlix. The surgeon noted excellent signal over the peroneal artery and the recommendation was to see as at the wound center to help with healing. 02/12/2016 -- we have not seen her for 2 weeks and her husband tells me that she was admitted to the hospital with a pneumonia. I have reviewed a hospital notes, she was there between January 19 and January 22, with clinical sepsis and bilateral pneumonia, fever and leukocytosis and was treated with ELAYNE, GRUVER. ([id number]) vancomycin and Zosyn. Vancomycin was then stopped and Zithromax was added. He was discharged home on Augmentin 02/23/2016 -- she had a arterial duplex study done recently at [facility] and her right ABI was 0.92 and the left ABI was 0.96. The femoral to peroneal bypass is widely patent. she was also seen by the surgeon who noted a postoperative seroma in the left groin. she was asked to return in 3 months for duplex examination 03/01/16 -she has got a lot of lymphedema for left lower extremity and there is weeping from the lower lateral leg. She had been walking around a lot yesterday as she had gone shopping. 03/08/2016 -- and did used to have a lot of lymphedema for left lower extremity but does not have much pain. Objective Constitutional Pulse regular. Respirations normal and unlabored. Afebrile. Vitals Time Taken: 2:37 PM, Height: 59 in, Weight:  140 lbs, BMI: 28.3, Temperature: 98.4 F, Pulse: 78 bpm, Respiratory Rate: 18 breaths/min, Blood Pressure: 134/54 mmHg. Eyes Nonicteric. Reactive to light. Ears, Nose, Mouth, and Throat Lips, teeth, and gums WNL.Marland Kitchen Moist mucosa without lesions. Neck supple and nontender. No palpable supraclavicular or cervical adenopathy. Normal sized without goiter. Respiratory WNL. No retractions.. Breath sounds WNL, No rubs, rales, rhonchi, or wheeze.. Cardiovascular Heart rhythm and rate regular, no murmur or gallop.. Pedal Pulses WNL. No clubbing, cyanosis or edema. Chest Breasts symmetical and no nipple discharge.. Breast tissue WNL, no masses, lumps, or tenderness.. Lymphatic No adneopathy. No adenopathy. No adenopathy. Musculoskeletal Adexa without tenderness or enlargement.. Digits and nails w/o clubbing, cyanosis, infection, petechiae, Calz[place], Desiraye  J. ([id number]) ischemia, or inflammatory conditions.Marland Kitchen Psychiatric Judgement and insight Intact.. No evidence of depression, anxiety, or agitation.. General Notes: the wound on the thigh has closed except for a small area inferiorly which will only admit her Q-tip. The wound on the left calf has minimal debris which was removed with a #3 curet and this is more superficial. Integumentary (Hair, Skin) No suspicious lesions. No crepitus or fluctuance. No peri-wound warmth or erythema. No masses.. Wound #1 status is Open. Original cause of wound was Surgical Injury. The wound is located on the Left,Medial Upper Leg. The wound measures 0.4cm length x 0.3cm width x 0.7cm depth; 0.094cm^2 area and 0.066cm^3 volume. There is Fat Layer (Subcutaneous Tissue) Exposed exposed. There is no tunneling or undermining noted. There is a medium amount of serosanguineous drainage noted. The wound margin is epibole. There is large (67-100%) red, pink granulation within the wound bed. There is a small (1-33%) amount of necrotic tissue within the wound bed  including Adherent Slough. The periwound skin appearance exhibited: Scarring. The periwound skin appearance did not exhibit: Erythema. Periwound temperature was noted as No Abnormality. The periwound has tenderness on palpation. Wound #3 status is Open. Original cause of wound was Surgical Injury. The wound is located on the Left,Distal Lower Leg. The wound measures 2.1cm length x 0.8cm width x 0.2cm depth; 1.319cm^2 area and 0.264cm^3 volume. There is Fat Layer (Subcutaneous Tissue) Exposed exposed. There is no tunneling or undermining noted. There is a large amount of serosanguineous drainage noted. The wound margin is epibole. There is medium (34-66%) red, pink granulation within the wound bed. There is a medium (34- 66%) amount of necrotic tissue within the wound bed including Adherent Slough. The periwound skin appearance exhibited: Scarring. The periwound skin appearance did not exhibit: Callus, Crepitus, Excoriation, Induration, Rash, Dry/Scaly, Maceration, Atrophie Blanche, Cyanosis, Ecchymosis, Hemosiderin Staining, Mottled, Pallor, Rubor, Erythema. Periwound temperature was noted as No Abnormality. The periwound has tenderness on palpation. Assessment Active Problems ICD-10 I70.242 - Atherosclerosis 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ration of calf L97.222 - Non-pressure chronic ulcer of left calf with fat layer exposed L97.122 - 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 exposed I89.0 - Lymphedema, not elsewhere classified DEAYSIA, GRIGORYAN ([id number]) Procedures Wound #3 Wound #3 is an Open Surgical Wound located on the Left,Distal Lower Leg . There was a Skin/Subcutaneous Tissue Debridement ([id number]) debridement with total area of 1.68 sq cm performed by Christin Fudge, MD. with the following instrument(s): Curette to remove Viable and Non-Viable tissue/material including Fibrin/Slough and Subcutaneous after achieving pain control using Lidocaine  4% Topical Solution. A time out was conducted at 14:54, prior to the start of the procedure. A Minimum amount of bleeding was controlled with Pressure. The procedure was tolerated well with a pain level of 0 throughout and a pain level of 0 following the procedure. Post Debridement Measurements: 2.1cm length x 0.8cm width x 0.2cm depth; 0.264cm^3 volume. Character of Wound/Ulcer Post Debridement is improved. Severity of Tissue Post Debridement is: Fat layer exposed. Post procedure Diagnosis Wound #3: Same as Pre-Procedure Plan Wound Cleansing: Wound #1 Left,Medial Upper Leg: Clean wound with Normal Saline. Cleanse wound with mild soap and water Wound #3 Left,Distal Lower Leg: Clean wound with Normal Saline. Cleanse wound with mild soap and water Anesthetic: Wound #1 Left,Medial Upper Leg: Topical Lidocaine 4% cream applied to wound bed prior to debridement - for clinic use Wound #3 Left,Distal Lower Leg: Topical Lidocaine 4% cream applied to wound bed  prior to debridement - for clinic use Skin Barriers/Peri-Wound Care: Wound #1 Left,Medial Upper Leg: Skin Prep Wound #3 Left,Distal Lower Leg: Skin Prep Primary Wound Dressing: Wound #1 Left,Medial Upper Leg: Iodoform packing Gauze Wound #3 Left,Distal Lower Leg: Medihoney gel Secondary Dressing: Wound #1 Left,Medial Upper Leg: Dry Gauze Boardered Foam Dressing Wound #3 Left,Distal Lower Leg: ROISE, EMERT ([id number]) ABD and Kerlix/Conform Dressing Change Frequency: Wound #1 Left,Medial Upper Leg: Change dressing every other day. Wound #3 Left,Distal Lower Leg: Change dressing every other day. Follow-up Appointments: Wound #1 Left,Medial Upper Leg: Return Appointment in 1 week. Wound #3 Left,Distal Lower Leg: Return Appointment in 1 week. Edema Control: Wound #1 Left,Medial Upper Leg: Elevate legs to the level of the heart and pump ankles as often as possible Wound #3 Left,Distal Lower Leg: Elevate legs to  the level of the heart and pump ankles as often as possible Additional Orders / Instructions: Wound #1 Left,Medial Upper Leg: Increase protein intake. Wound #3 Left,Distal Lower Leg: Increase protein intake. Home Health: Wound #1 Left,Medial Upper Leg: [place] Nurse may visit PRN to address patient s wound care needs. FACE TO FACE ENCOUNTER: MEDICARE and MEDICAID PATIENTS: I certify that this patient is under my care and that I had a face-to-face encounter that meets the physician face-to-face encounter requirements with this patient on this date. The encounter with the patient was in whole or in part for the following MEDICAL CONDITION: (primary reason for [place]) MEDICAL NECESSITY: I certify, that based on my findings, NURSING services are a medically necessary home health service. HOME BOUND STATUS: I certify that my clinical findings support that this patient is homebound (i.e., Due to illness or injury, pt requires aid of supportive devices such as crutches, cane, wheelchairs, walkers, the use of special transportation or the assistance of another person to leave their place of residence. There is a normal inability to leave the home and doing so requires considerable and taxing effort. Other absences are for medical reasons / religious services and are infrequent or of short duration when for other reasons). If current dressing causes regression in wound condition, may D/C ordered dressing product/s and apply Normal Saline Moist Dressing daily until next [place] / Other MD appointment. [place] of regression in wound condition at [phone]. Please direct any NON-WOUND related issues/requests for orders to patient's Primary Care Physician Wound #3 Left,Distal Lower Leg: [place] Nurse may visit PRN to address patient s wound care needs. FACE TO FACE ENCOUNTER: MEDICARE and  MEDICAID PATIENTS: I certify that this patient is under my care and that I had a face-to-face encounter that meets the physician face-to-face encounter requirements with this patient on this date. The encounter with the patient was in whole or in part for the following MEDICAL CONDITION: (primary reason for [place]) MEDICAL NECESSITY: I certify, that based on my findings, NURSING services are a medically necessary home health service. HOME BOUND STATUS: I certify that my clinical findings support that this patient is homebound (i.e., Due to illness or injury, pt requires aid of supportive devices such as crutches, cane, wheelchairs, walkers, the use of special transportation or the assistance of another person to leave their place of residence. There is a normal inability to leave the home and doing so requires considerable and taxing effort. Other absences are QUETZALLI, CLOS ([id number]) for medical reasons / religious services and are infrequent or of short duration  when for other reasons). If current dressing causes regression in wound condition, may D/C ordered dressing product/s and apply Normal Saline Moist Dressing daily until next [place] / Other MD appointment. [place] of regression in wound condition at [phone]. Please direct any NON-WOUND related issues/requests for orders to patient's Primary Care Physician I have recommended: 1. packing of the thigh wound with 1/4 inch iodoform gauze and an appropriate foam border. 2. the wound lower down will be covered with Medihoney and an appropriate bordered foam 3. elevation and exercise of both lower extremities 4. Good amount of protein intake, vitamin A, vitamin C and zinc 5. Regular visits to the wound center -- I anticipate discharge soon Electronic Signature(s) Signed: 04/28/2016 2:35:39 PM By: [name] RN, [name], [name] RN, [name] Signed: 05/11/2016 12:41:57 PM By: Christin Fudge MD,  FACS Previous Signature: 03/26/2016 3:05:01 PM Version By: Christin Fudge MD, FACS Previous Signature: 03/26/2016 3:04:45 PM Version By: Christin Fudge MD, FACS Entered By: [name] RN, [name], [name] on 04/28/2016 14:35:39 ILAISAANE, MARTS ([id number]) -------------------------------------------------------------------------------- SuperBill Details Patient Name: Natasha [name] Date of Service: 03/26/2016 Medical Record Number: [id number] Patient Account Number: [id number] Date of Birth/Sex: 1928-06-14 (83 y.o. 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 I89.0 Lymphedema, not elsewhere classified Facility Procedures CPT4 Code Description: [id number] 11042 - DEB SUBQ TISSUE 20 SQ CM/< ICD-10 Description Diagnosis I70.242 Atherosclerosis 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 L97.222 Non-pressure chronic ulcer of left calf with fat layer e L97.122 Non-pressure chronic  ulcer of left thigh with fat layer I89.0 Lymphedema, not elsewhere classified Modifier: ration of ca xposed exposed Quantity: 1 lf Physician Procedures CPT4 Code Description: [id number] 02585 - WC PHYS SUBQ TISS 20 SQ CM ICD-10 Description Diagnosis I70.242 Atherosclerosis 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 L97.222 Non-pressure chronic ulcer of left calf with fat layer e L97.122 Non-pressure chronic  ulcer of left thigh with fat layer I89.0 Lymphedema, not elsewhere classified Modifier: ration of ca xposed exposed Quantity: 1 lf Electronic Signature(s) Signed: 04/21/2016 11:30:20 AM By: [name], RN, [name], [name] RN,  [name] Signed: 04/22/2016 2:17:43 PM By: Christin Fudge MD, FACS Previous Signature: 03/26/2016 3:05:14 PM Version By: Christin Fudge MD, FACS Entered By: [name] RN, [name], [name] on 04/21/2016 11:30:19

## 2016-03-27 NOTE — ED Provider Notes (Signed)
[facility] Emergency Department Provider Note  ____________________________________________   I have reviewed the triage vital signs and the nursing notes.   HISTORY  Chief Complaint Foot Pain    HPI Natasha [name] is a 83 y.o. female states that she started feeling pain in her leg over the last few days. It is gotten to the point where she can't walk. Patient is a known history of problems in the left leg with history of vascular surgery, and wound care for a nonhealing lesion however that is almost better. Her complaint now is that she has right lower extremity pain and swelling. It is hot to touch and red. She's had fevers over 100 at home. 100.9. She feels that it is infected. She does not have any cough or shortness of breath although EMS noted that her sats were in the mid 70s before the administration of oxygen. Patient has no pulmonary symptoms that she can identify no history of PE that she knows of    Past Medical History:  Diagnosis Date  . Anemia   . Arthritis   . Asthma   . Bilateral pneumonia may 2017  . Bronchitis   . Cardiomegaly   . Chronic respiratory failure with hypoxia ([place])   . Cochlear implant in place    bilateral, [name], 04/26/2007   . Gallstones 2015  . GERD (gastroesophageal reflux disease)   . H/O blood clots 2014  . History of home oxygen therapy    at night  . Hypertension   . Hypokalemia   . Peripheral vascular disease ([place])    with arterial clots- on xarelto  . Polyneuropathy ([place])   . Renal disorder    Chronic Kidney Disease, Stage 5  . Restless leg syndrome     Patient Active Problem List   Diagnosis Date Noted  . Sepsis ([place]) 08/24/2015  . HCAP (healthcare-associated pneumonia) 06/27/2015  . Pneumonia 06/07/2015  . Hypotension 03/11/2015  . 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 Ischemic leg 02/27/2015  . Calculus of gallbladder with other cholecystitis, without mention of  obstruction 04/20/2013    Past Surgical History:  Procedure Laterality Date  . ABDOMINAL HYSTERECTOMY  1960  . APPENDECTOMY  1946  . BREAST BIOPSY Bilateral    negative  . CHOLECYSTECTOMY  04-24-13  . COCHLEAR IMPLANT  2009  . COLONOSCOPY  2015   Dr. Rayann Heman   . EYE SURGERY  2008,2009   cataract  . KYPHOPLASTY N/A 08/19/2015   Procedure: KYPHOPLASTY;  Surgeon: Hessie Knows, MD;  Location: [facility] ORS;  Service: Orthopedics;  Laterality: N/A;  . PERIPHERAL VASCULAR CATHETERIZATION N/A 02/27/2015   Procedure: Abdominal Aortogram w/Lower Extremity;  Surgeon: Algernon Huxley, MD;  Location: [place] [facility];  Service: Cardiovascular;  Laterality: N/A;  . PERIPHERAL VASCULAR CATHETERIZATION  02/27/2015   Procedure: Lower Extremity Intervention;  Surgeon: Algernon Huxley, MD;  Location: [place] [facility];  Service: Cardiovascular;;  . PERIPHERAL VASCULAR CATHETERIZATION Left 02/28/2015   Procedure: Lower Extremity Angiography;  Surgeon: Algernon Huxley, MD;  Location: [place] [facility];  Service: Cardiovascular;  Laterality: Left;  . PERIPHERAL VASCULAR CATHETERIZATION  02/28/2015   Procedure: Lower Extremity Intervention;  Surgeon: Algernon Huxley, MD;  Location: [place] [facility];  Service: Cardiovascular;;  . PERIPHERAL VASCULAR CATHETERIZATION Left 05/22/2015   Procedure: Lower Extremity Angiography;  Surgeon: Algernon Huxley, MD;  Location: [place] [facility];  Service: Cardiovascular;  Laterality: Left;  . PERIPHERAL VASCULAR  CATHETERIZATION  05/22/2015   Procedure: Lower Extremity Intervention;  Surgeon: Algernon Huxley, MD;  Location: [place] [facility];  Service: Cardiovascular;;  . PERIPHERAL VASCULAR CATHETERIZATION Left 05/23/2015   Procedure: Lower Extremity Angiography;  Surgeon: Algernon Huxley, MD;  Location: [place] [facility];  Service: Cardiovascular;  Laterality: Left;  . PERIPHERAL VASCULAR CATHETERIZATION  05/23/2015   Procedure: Lower Extremity Intervention;  Surgeon: Algernon Huxley, MD;   Location: [place] [facility];  Service: Cardiovascular;;  . stent placement  2006-2014   multiple stent placements in legs    Prior to Admission medications   Medication Sig Start Date End Date Taking? Authorizing Provider  albuterol (PROVENTIL HFA;VENTOLIN HFA) 108 (90 BASE) MCG/ACT inhaler Inhale 2 puffs into the lungs 2 (two) times daily as needed for wheezing or shortness of breath.     Historical Provider, MD  albuterol (PROVENTIL) (2.5 MG/3ML) 0.083% nebulizer solution Take 3 mLs (2.5 mg total) by nebulization every 6 (six) hours as needed for wheezing or shortness of breath. Patient not taking: Reported on 11/19/2015 06/11/15   Loletha Grayer, MD  amoxicillin-clavulanate (AUGMENTIN) 875-125 MG tablet Take 1 tablet by mouth every 12 (twelve) hours. 02/02/16   Loletha Grayer, MD  aspirin EC 325 MG tablet Take 325 mg by mouth at bedtime.     Historical Provider, MD  budesonide (PULMICORT) 180 MCG/ACT inhaler Inhale 2 puffs into the lungs 2 (two) times daily.    Historical Provider, MD  Calcium Carbonate-Vit D-Min (CALCIUM 1200) 1200-1000 MG-UNIT CHEW Chew 1 tablet by mouth daily.    Historical Provider, MD  cholecalciferol (VITAMIN D) 1000 units tablet Take 1,000 Units by mouth every morning.     Historical Provider, MD  clopidogrel (PLAVIX) 75 MG tablet Take 75 mg by mouth daily.    Historical Provider, MD  diltiazem (CARDIZEM CD) 180 MG 24 hr capsule Take 180 mg by mouth every morning.     Historical Provider, MD  docusate sodium (COLACE) 100 MG capsule Take 400 mg by mouth at bedtime.    Historical Provider, MD  Esomeprazole Magnesium (NEXIUM PO) Take 22.3 mg by mouth 2 (two) times daily.     Historical Provider, MD  ferrous sulfate 325 (65 FE) MG tablet Take 1 tablet (325 mg total) by mouth daily. 05/24/15   Kimberly A Stegmayer, PA-C  furosemide (LASIX) 20 MG tablet Take 20 mg by mouth 2 (two) times daily.    Historical Provider, MD  gabapentin (NEURONTIN) 300 MG capsule Take 300 mg by  mouth 2 (two) times daily.    Historical Provider, MD  Melatonin 5 MG TABS Take 5 mg by mouth at bedtime.     Historical Provider, MD  montelukast (SINGULAIR) 10 MG tablet Take 10 mg by mouth every morning.     Historical Provider, MD  oxyCODONE (ROXICODONE) 5 MG immediate release tablet Take 1 tablet (5 mg total) by mouth every 4 (four) hours as needed for severe pain. Patient taking differently: Take 2.5 mg by mouth every 4 (four) hours as needed for severe pain.  08/19/15   Hessie Knows, MD  polyethylene glycol [facility] / Floria Raveling) packet Take 17 g by mouth daily as needed. Patient taking differently: Take 17 g by mouth 2 (two) times daily. Mix in 8 oz liquid and drink 06/11/15   Loletha Grayer, MD  potassium chloride SA (K-DUR,KLOR-CON) 20 MEQ tablet Take 20-40 mEq by mouth See admin instructions. Take 2 tablets (40 mEq) by mouth every morning, Take 20 mEq  by mouth every afternoon, and Take 2 tablets (40 mEq) by mouth every night at bedtime.    Historical Provider, MD  pramipexole (MIRAPEX) 0.5 MG tablet Take 0.5 mg by mouth 2 (two) times daily. 06/30/15   Historical Provider, MD  simvastatin (ZOCOR) 20 MG tablet Take 20 mg by mouth at bedtime. 07/03/15   Historical Provider, MD  temazepam (RESTORIL) 15 MG capsule Take 15 mg by mouth at bedtime as needed for sleep.    Historical Provider, MD    Allergies Codeine; Hydrocodone; Levofloxacin; and Tramadol  Family History  Problem Relation Age of Onset  . Heart disease Mother   . Heart disease Father   . Cancer Sister     lung  . Breast cancer Daughter     Social History Social History  Substance Use Topics  . Smoking status: Former Smoker    Packs/day: 1.00    Years: 25.00    Types: Cigarettes  . Smokeless tobacco: Never Used  . Alcohol use No    Review of Systems Constitutional: No fever/chills Eyes: No visual changes. ENT: No sore throat. No stiff neck no neck pain Cardiovascular: Denies chest pain. Respiratory: Denies  shortness of breath. Gastrointestinal:   no vomiting.  No diarrhea.  No constipation. Genitourinary: Negative for dysuria. Musculoskeletal: Positive lower extremity swelling Skin: Ulcerative lower extremity erythema and pain Neurological: Negative for severe headaches, focal weakness or numbness. 10-point ROS otherwise negative.  ____________________________________________   PHYSICAL EXAM:  VITAL SIGNS: ED Triage Vitals  Enc Vitals Group     BP 03/27/16 1137 (!) 102/49     Pulse Rate 03/27/16 1137 85     Resp 03/27/16 1137 20     Temp 03/27/16 1137 100.1 F (37.8 C)     Temp Source 03/27/16 1137 Oral     SpO2 03/27/16 1137 (!) 79 %     Weight 03/27/16 1139 139 lb (63 kg)     Height --      Head Circumference --      Peak Flow --      Pain Score 03/27/16 1138 5     Pain Loc --      Pain Edu? --      Excl. in [place]? --     Constitutional: Alert and oriented. Well appearing and in no acute distress. Eyes: Conjunctivae are normal. PERRL. EOMI. Head: Atraumatic. Nose: No congestion/rhinnorhea. Mouth/Throat: Mucous membranes are moist.  Oropharynx non-erythematous. Neck: No stridor.   Nontender with no meningismus Cardiovascular: Normal rate, regular rhythm. Grossly normal heart sounds.  Good peripheral circulation. Respiratory: Normal respiratory effort.There are diffuse rales bilateral bases Abdominal: Soft and nontender. No distention. No guarding no rebound Back:  There is no focal tenderness or step off.  there is no midline tenderness there are no lesions noted. there is no CVA tenderness Musculoskeletal: Patient has bilateral erythema more to the right than the left. There are pulses noted to palpation. She has evidence of prior surgery on the left side which does not appear to be acutely infected. There is a diffuse erythema going up the leg on the right especially although there is some degree on the left as well. It is hot to touch on the right versus the left. The  patient has no obvious source of infection in terms of break of the skin. There is tenderness to palpation along the erythematous region which goes up towards her, Neurologic:  Normal speech and language. No gross focal neurologic deficits are appreciated.  Skin:  Skin is warm, dry and intact. No rash noted. Psychiatric: Mood and affect are normal. Speech and behavior are normal.  ____________________________________________   LABS (all labs ordered are listed, but only abnormal results are displayed)  Labs Reviewed  CULTURE, BLOOD (ROUTINE X 2)  CULTURE, BLOOD (ROUTINE X 2)  COMPREHENSIVE METABOLIC PANEL  LACTIC ACID, PLASMA  LACTIC ACID, PLASMA  CBC WITH DIFFERENTIAL/PLATELET  PROTIME-INR  URINALYSIS, COMPLETE (UACMP) WITH MICROSCOPIC   ____________________________________________  EKG  I personally interpreted any EKGs ordered by me or triage Sinus rhythm rate 82 bpm no acute ST elevation or depression nonspecific ST changes ____________________________________________  RADIOLOGY  I reviewed any imaging ordered by me or triage that were performed during my shift and, if possible, patient and/or family made aware of any abnormal findings. ____________________________________________   PROCEDURES  Procedure(s) performed: None  Procedures  Critical Care performed: CRITICAL CARE Performed by: Schuyler Amor   Total critical care time: 35 minutes  Critical care time was exclusive of separately billable procedures and treating other patients.  Critical care was necessary to treat or prevent imminent or life-threatening deterioration.  Critical care was time spent personally by me on the following activities: development of treatment plan with patient and/or surrogate as well as nursing, discussions with consultants, evaluation of patient's response to treatment, examination of patient, obtaining history from patient or surrogate, ordering and performing treatments and  interventions, ordering and review of laboratory studies, ordering and review of radiographic studies, pulse oximetry and re-evaluation of patient's condition.   ____________________________________________   INITIAL IMPRESSION / ASSESSMENT AND PLAN / ED COURSE  Pertinent labs & imaging results that were available during my care of the patient were reviewed by me and considered in my medical decision making (see chart for details).  Administration as 2 different problems to the first is her apparent cellulitis the right lower extremity. She has fever, erythema, and based on poor circulation. This does not appear to be a DVT is very red and hot to touch. No evidence of abscess or cellulitis. We will treated with vancomycin, also, patient noted to have very low oxygen saturation here she has no complaints of shortness of breath or wheeze. She states she does have asthma but she really has Rales in the bases. Chest x-ray shows possible pneumonia which we're treating with Zosyn and start with. Patient will also obtain a CT scan to rule out be given significant hypoxia       ____________________________________________   FINAL CLINICAL IMPRESSION(S) / ED DIAGNOSES  Final diagnoses:  None      This chart was dictated using voice recognition software.  Despite best efforts to proofread,  errors can occur which can change meaning.      Schuyler Amor, MD 03/27/16 1257

## 2016-03-27 NOTE — ED Notes (Signed)
Patient transported to CT 

## 2016-03-27 NOTE — Progress Notes (Signed)
JOSANNE, BOEREMA ([id number]) Visit Report for 03/26/2016 Arrival Information Details Patient Name: LEGACIE, DILLINGHAM Date of Service: 03/26/2016 2:30 PM Medical Record Number: [id number] Patient Account Number: [id number] Date of Birth/Sex: 1928-08-21 (83 y.o. Female) Treating RN: Montey Hora Primary Care Isebella Upshur: Emily Filbert Other Clinician: Referring Dashana Guizar: Emily Filbert Treating Nehemie Casserly/Extender: Frann Rider in Treatment: 9 Visit Information History Since Last Visit Added or deleted any medications: No Patient Arrived: Walker Any new allergies or adverse reactions: No Arrival Time: 14:35 Had a fall or experienced change in No Accompanied By: spouse activities of daily living that may affect Transfer Assistance: None risk of falls: Patient Identification Verified: Yes Signs or symptoms of abuse/neglect since last No Secondary Verification Process Yes visito Completed: Hospitalized since last visit: No Patient Requires Transmission- No Has Dressing in Place as Prescribed: Yes Based Precautions: Pain Present Now: No Patient Has Alerts: Yes Patient Alerts: Patient on Blood Thinner Plavix Electronic Signature(s) Signed: 03/26/2016 4:31:35 PM By: Montey Hora Entered By: Montey Hora on 03/26/2016 14:36:10 [name] ([id number]) -------------------------------------------------------------------------------- Encounter Discharge Information Details Patient Name: [name] Date of Service: 03/26/2016 2:30 PM Medical Record Number: [id number] Patient Account Number: [id number] Date of Birth/Sex: 10/31/1928 (83 y.o. Female) Treating RN: Montey Hora Primary Care Vaun Hyndman: Emily Filbert Other Clinician: Referring Ena Demary: Emily Filbert Treating Zamorah Ailes/Extender: Frann Rider in Treatment: 9 Encounter Discharge Information Items Discharge Pain Level: 0 Discharge Condition: Stable Ambulatory Status: Walker Discharge  Destination: Home Transportation: Private Auto Accompanied By: spouse Schedule Follow-up Appointment: Yes Medication Reconciliation completed No and provided to Patient/Care Delaine Hernandez: Provided on Clinical Summary of Care: 03/26/2016 Form Type Recipient Paper Patient [facility] Electronic Signature(s) Signed: 03/26/2016 3:11:32 PM By: Ruthine Dose Entered By: Ruthine Dose on 03/26/2016 15:11:32 [name] ([id number]) -------------------------------------------------------------------------------- Lower Extremity Assessment Details Patient Name: [name] Date of Service: 03/26/2016 2:30 PM Medical Record Number: [id number] Patient Account Number: [id number] Date of Birth/Sex: [birth year] (83 y.o. Female) Treating RN: Montey Hora Primary Care Anton Cheramie: Emily Filbert Other Clinician: Referring Jelesa Mangini: Emily Filbert Treating Chaun Uemura/Extender: Frann Rider in Treatment: 9 Edema Assessment Assessed: [Left: No] [Right: No] E[Left: dema] [Right: :] Calf Left: Right: Point of Measurement: 28 cm From Medial Instep cm cm Ankle Left: Right: Point of Measurement: 10 cm From Medial Instep cm cm Vascular Assessment Pulses: Dorsalis Pedis Palpable: [Left:Yes] Posterior Tibial Extremity colors, hair growth, and conditions: Extremity Color: [Left:Normal] Hair Growth on Extremity: [Left:No] Temperature of Extremity: [Left:Warm] Capillary Refill: [Left:< 3 seconds] Electronic Signature(s) Signed: 03/26/2016 4:31:35 PM By: Montey Hora Entered By: Montey Hora on 03/26/2016 14:53:22 [name] ([id number]) -------------------------------------------------------------------------------- Multi Wound Chart Details Patient Name: [name] Date of Service: 03/26/2016 2:30 PM Medical Record Number: [id number] Patient Account Number: [id number] Date of Birth/Sex: 1928-03-15 (83 y.o. Female) Treating RN: Montey Hora Primary Care Oseph Imburgia:  Emily Filbert Other Clinician: Referring Tanner Yeley: Emily Filbert Treating Ilyse Tremain/Extender: Frann Rider in Treatment: 9 Vital Signs Height(in): 59 Pulse(bpm): 78 Weight(lbs): 140 Blood Pressure 134/54 (mmHg): Body Mass Index(BMI): 28 Temperature(F): 98.4 Respiratory Rate 18 (breaths/min): Photos: [N/A:N/A] Wound Location: Left Upper Leg - Medial Left Lower Leg - Distal N/A Wounding Event: Surgical Injury Surgical Injury N/A Primary Etiology: Open Surgical Wound Open Surgical Wound N/A Comorbid History: Anemia, Asthma, Anemia, Asthma, N/A Hypertension, Peripheral Hypertension, Peripheral Venous Disease, Venous Disease, Osteoarthritis, Neuropathy Osteoarthritis, Neuropathy Date Acquired: 12/04/2015 12/04/2015 N/A Weeks of Treatment: 9 9 N/A Wound Status: Open Open N/A Measurements L x W x D 0.4x0.3x0.7 2.1x0.8x0.2 N/A (cm)  Area (cm) : 0.094 1.319 N/A Volume (cm) : 0.066 0.264 N/A % Reduction in Area: 90.00% 92.50% N/A % Reduction in Volume: 95.00% 97.90% N/A Classification: Full Thickness With Full Thickness With N/A Exposed Support Exposed Support Structures Structures Exudate Amount: Medium Large N/A Exudate Type: Serosanguineous Serosanguineous N/A Exudate Color: red, brown red, brown N/A Wound Margin: Epibole Epibole N/A SHAQUITTA, BURBRIDGE ([id number]) Granulation Amount: Large (67-100%) Medium (34-66%) N/A Granulation Quality: Red, Pink Red, Pink, Hyper- N/A granulation Necrotic Amount: Small (1-33%) Medium (34-66%) N/A Exposed Structures: Fat Layer (Subcutaneous Fat Layer (Subcutaneous N/A Tissue) Exposed: Yes Tissue) Exposed: Yes Fascia: No Fascia: No Tendon: No Tendon: No Muscle: No Muscle: No Joint: No Joint: No Bone: No Bone: No Epithelialization: Small (1-33%) Small (1-33%) N/A Debridement: N/A Debridement ([id number]) Pre-procedure N/A 14:54 N/A Verification/Time Out Taken: Pain Control: N/A Lidocaine 4% Topical  N/A Solution Tissue Debrided: N/A Fibrin/Slough, N/A Subcutaneous Level: N/A Skin/Subcutaneous N/A Tissue Debridement Area (sq N/A 1.68 N/A cm): Instrument: N/A Curette N/A Bleeding: N/A Minimum N/A Hemostasis Achieved: N/A Pressure N/A Procedural Pain: N/A 0 N/A Post Procedural Pain: N/A 0 N/A Debridement Treatment N/A Procedure was tolerated N/A Response: well Post Debridement N/A 2.1x0.8x0.2 N/A Measurements L x W x D (cm) Post Debridement N/A 0.264 N/A Volume: (cm) Periwound Skin Texture: Scarring: Yes Scarring: Yes N/A Excoriation: No Induration: No Callus: No Crepitus: No Rash: No Periwound Skin No Abnormalities Noted Maceration: No N/A Moisture: Dry/Scaly: No Periwound Skin Color: Erythema: No Atrophie Blanche: No N/A Cyanosis: No Ecchymosis: No Erythema: No Hemosiderin Staining: No KAY, SHIPPY ([id number]) Mottled: No Pallor: No Rubor: No Temperature: No Abnormality No Abnormality N/A Tenderness on Yes Yes N/A Palpation: Wound Preparation: Ulcer Cleansing: Ulcer Cleansing: N/A Rinsed/Irrigated with Rinsed/Irrigated with Saline Saline Topical Anesthetic Topical Anesthetic Applied: Other: lidocaine Applied: Other: lidocaine 4% 4% Procedures Performed: N/A Debridement N/A Treatment Notes Wound #1 (Left, Medial Upper Leg) 1. Cleansed with: Clean wound with Normal Saline 2. Anesthetic Topical Lidocaine 4% cream to wound bed prior to debridement 4. Dressing Applied: Iodoform packing Gauze 5. Secondary Dressing Applied Bordered Foam Dressing Wound #3 (Left, Distal Lower Leg) 1. Cleansed with: Clean wound with Normal Saline 2. Anesthetic Topical Lidocaine 4% cream to wound bed prior to debridement 4. Dressing Applied: Medihoney Gel 5. Secondary Dressing Applied Guaze, ABD and kerlix/Conform 7. Secured with Recruitment consultant) Signed: 03/26/2016 3:02:59 PM By: Christin Fudge MD, FACS Entered By: Christin Fudge on 03/26/2016  15:02:59 [name] ([id number]) -------------------------------------------------------------------------------- [place] Details Patient Name: [name] Date of Service: 03/26/2016 2:30 PM Medical Record Number: [id number] Patient Account Number: [id number] Date of Birth/Sex: 02-27-1928 (83 y.o. Female) Treating RN: Montey Hora Primary Care Latima Hamza: Emily Filbert Other Clinician: Referring Jonnie Kubly: Emily Filbert Treating Alicja Everitt/Extender: Frann Rider in Treatment: 9 Active Inactive ` Abuse / Safety / Falls / Self Care Management Nursing Diagnoses: Potential for falls Goals: Patient will remain injury free Date Initiated: 01/19/2016 Target Resolution Date: 05/25/2016 Goal Status: Active Interventions: Assess fall risk on admission and as needed Assess: immobility, friction, shearing, incontinence upon admission and as needed Assess impairment of mobility on admission and as needed per policy Notes: ` Nutrition Nursing Diagnoses: Imbalanced nutrition Goals: Patient/caregiver agrees to and verbalizes understanding of need to use nutritional supplements and/or vitamins as prescribed Date Initiated: 01/19/2016 Target Resolution Date: 05/25/2016 Goal Status: Active Interventions: Assess patient nutrition upon admission and as needed per policy Notes: ` Orientation to the [place], [place]  Lenna Sciara ([id number]) Nursing Diagnoses: Knowledge deficit related to the wound healing center program Goals: Patient/caregiver will verbalize understanding of the [place] Date Initiated: 01/19/2016 Target Resolution Date: 05/25/2016 Goal Status: Active Interventions: Provide education on orientation to the wound center Notes: ` Pain, Acute or Chronic Nursing Diagnoses: Pain, acute or chronic: actual or potential Potential alteration in comfort, pain Goals: Patient will verbalize adequate pain control  and receive pain control interventions during procedures as needed Date Initiated: 01/19/2016 Target Resolution Date: 05/25/2016 Goal Status: Active Patient/caregiver will verbalize adequate pain control between visits Date Initiated: 01/19/2016 Target Resolution Date: 05/25/2016 Goal Status: Active Patient/caregiver will verbalize comfort level met Date Initiated: 01/19/2016 Target Resolution Date: 05/25/2016 Goal Status: Active Interventions: Assess comfort goal upon admission Complete pain assessment as per visit requirements Notes: ` Wound/Skin Impairment Nursing Diagnoses: Impaired tissue integrity Knowledge deficit related to ulceration/compromised skin integrity Goals: TATUMN, CORBRIDGE ([id number]) Ulcer/skin breakdown will have a volume reduction of 30% by week 4 Date Initiated: 01/19/2016 Target Resolution Date: 05/25/2016 Goal Status: Active Ulcer/skin breakdown will have a volume reduction of 50% by week 8 Date Initiated: 01/19/2016 Target Resolution Date: 05/25/2016 Goal Status: Active Ulcer/skin breakdown will have a volume reduction of 80% by week 12 Date Initiated: 01/19/2016 Target Resolution Date: 05/25/2016 Goal Status: Active Interventions: Assess patient/caregiver ability to perform ulcer/skin care regimen upon admission and as needed Assess ulceration(s) every visit Notes: Electronic Signature(s) Signed: 03/26/2016 4:31:35 PM By: Montey Hora Entered By: Montey Hora on 03/26/2016 14:53:48 [name] ([id number]) -------------------------------------------------------------------------------- Pain Assessment Details Patient Name: [name] Date of Service: 03/26/2016 2:30 PM Medical Record Number: [id number] Patient Account Number: [id number] Date of Birth/Sex: 11/10/1928 (83 y.o. Female) Treating RN: Montey Hora Primary Care Andrae Claunch: Emily Filbert Other Clinician: Referring Tanaya Dunigan: Emily Filbert Treating Naylene Foell/Extender:  Frann Rider in Treatment: 9 Active Problems Location of Pain Severity and Description of Pain Patient Has Paino Yes Site Locations Pain Location: Generalized Pain With Dressing Change: No Pain Management and Medication Current Pain Management: Goals for Pain Management states she sprained her foot Notes Topical or injectable lidocaine is offered to patient for acute pain when surgical debridement is performed. If needed, Patient is instructed to use over the counter pain medication for the following 24-48 hours after debridement. Wound care MDs do not prescribed pain medications. Patient has chronic pain or uncontrolled pain. Patient has been instructed to make an appointment with their Primary Care Physician for pain management. Electronic Signature(s) Signed: 03/26/2016 4:31:35 PM By: Montey Hora Entered By: Montey Hora on 03/26/2016 14:37:35 [name] ([id number]) -------------------------------------------------------------------------------- Patient/Caregiver Education Details Patient Name: [name] Date of Service: 03/26/2016 2:30 PM Medical Record Number: [id number] Patient Account Number: [id number] Date of Birth/Gender: Jul 27, 1928 (83 y.o. Female) Treating RN: Montey Hora Primary Care Physician: Emily Filbert Other Clinician: Referring Physician: Emily Filbert Treating Physician/Extender: Frann Rider in Treatment: 9 Education Assessment Education Provided To: Patient Education Topics Provided Wound/Skin Impairment: Handouts: Other: wound care as ordered Methods: Demonstration, Explain/Verbal Responses: State content correctly Electronic Signature(s) Signed: 03/26/2016 4:31:35 PM By: Montey Hora Entered By: Montey Hora on 03/26/2016 15:00:06 [name] ([id number]) -------------------------------------------------------------------------------- Wound Assessment Details Patient Name: [name] Date of Service: 03/26/2016 2:30 PM Medical Record Number: [id number] Patient Account Number: [id number] Date of Birth/Sex: 03-20-1928 (83 y.o. Female) Treating RN: Montey Hora Primary Care Octa Uplinger: Emily Filbert Other Clinician: Referring Tysheem Accardo: Emily Filbert Treating Willet Schleifer/Extender: Frann Rider in Treatment: 9 Wound Status Wound Number: 1  Primary Open Surgical Wound Etiology: Wound Location: Left Upper Leg - Medial Wound Open Wounding Event: Surgical Injury Status: Date Acquired: 12/04/2015 Comorbid Anemia, Asthma, Hypertension, Weeks Of Treatment: 9 History: Peripheral Venous Disease, Clustered Wound: No Osteoarthritis, Neuropathy Photos Photo Uploaded By: Montey Hora on 03/26/2016 14:56:03 Wound Measurements Length: (cm) 0.4 Width: (cm) 0.3 Depth: (cm) 0.7 Area: (cm) 0.094 Volume: (cm) 0.066 % Reduction in Area: 90% % Reduction in Volume: 95% Epithelialization: Small (1-33%) Tunneling: No Undermining: No Wound Description Full Thickness With Exposed Classification: Support Structures Wound Margin: Epibole Exudate Medium Amount: Exudate Type: Serosanguineous Exudate Color: red, brown Foul Odor After Cleansing: No Slough/Fibrino Yes Wound Bed Granulation Amount: Large (67-100%) Exposed Structure Granulation Quality: Red, Pink Fascia Exposed: No RAIYAH, SPEAKMAN ([id number]) Necrotic Amount: Small (1-33%) Fat Layer (Subcutaneous Tissue) Exposed: Yes Necrotic Quality: Adherent Slough Tendon Exposed: No Muscle Exposed: No Joint Exposed: No Bone Exposed: No Periwound Skin Texture Texture Color No Abnormalities Noted: No No Abnormalities Noted: No Scarring: Yes Erythema: No Moisture Temperature / Pain No Abnormalities Noted: No Temperature: No Abnormality Tenderness on Palpation: Yes Wound Preparation Ulcer Cleansing: Rinsed/Irrigated with Saline Topical Anesthetic Applied: Other: lidocaine 4%, Treatment Notes Wound #1  (Left, Medial Upper Leg) 1. Cleansed with: Clean wound with Normal Saline 2. Anesthetic Topical Lidocaine 4% cream to wound bed prior to debridement 4. Dressing Applied: Iodoform packing Gauze 5. Secondary Dressing Applied Bordered Foam Dressing Electronic Signature(s) Signed: 03/26/2016 4:31:35 PM By: Montey Hora Entered By: Montey Hora on 03/26/2016 14:52:28 [name] ([id number]) -------------------------------------------------------------------------------- Wound Assessment Details Patient Name: [name] Date of Service: 03/26/2016 2:30 PM Medical Record Number: [id number] Patient Account Number: [id number] Date of Birth/Sex: 1928-03-04 (82 y.o. Female) Treating RN: Montey Hora Primary Care [place]: Emily Filbert Other Clinician: Referring Demya Scruggs: Emily Filbert Treating Alva Broxson/Extender: Frann Rider in Treatment: 9 Wound Status Wound Number: 3 Primary Open Surgical Wound Etiology: Wound Location: Left Lower Leg - Distal Wound Open Wounding Event: Surgical Injury Status: Date Acquired: 12/04/2015 Comorbid Anemia, Asthma, Hypertension, Weeks Of Treatment: 9 History: Peripheral Venous Disease, Clustered Wound: No Osteoarthritis, Neuropathy Photos Photo Uploaded By: Montey Hora on 03/26/2016 14:56:03 Wound Measurements Length: (cm) 2.1 Width: (cm) 0.8 Depth: (cm) 0.2 Area: (cm) 1.319 Volume: (cm) 0.264 % Reduction in Area: 92.5% % Reduction in Volume: 97.9% Epithelialization: Small (1-33%) Tunneling: No Undermining: No Wound Description Full Thickness With Exposed Classification: Support Structures Wound Margin: Epibole Exudate Large Amount: Exudate Type: Serosanguineous Exudate Color: red, brown Foul Odor After Cleansing: No Slough/Fibrino Yes Wound Bed Granulation Amount: Medium (34-66%) Exposed Structure Granulation Quality: Red, Pink, Hyper-granulation Fascia Exposed: No TAIJA, MATHIAS  ([id number]) Necrotic Amount: Medium (34-66%) Fat Layer (Subcutaneous Tissue) Exposed: Yes Necrotic Quality: Adherent Slough Tendon Exposed: No Muscle Exposed: No Joint Exposed: No Bone Exposed: No Periwound Skin Texture Texture Color No Abnormalities Noted: No No Abnormalities Noted: No Callus: No Atrophie Blanche: No Crepitus: No Cyanosis: No Excoriation: No Ecchymosis: No Induration: No Erythema: No Rash: No Hemosiderin Staining: No Scarring: Yes Mottled: No Pallor: No Moisture Rubor: No No Abnormalities Noted: No Dry / Scaly: No Temperature / Pain Maceration: No Temperature: No Abnormality Tenderness on Palpation: Yes Wound Preparation Ulcer Cleansing: Rinsed/Irrigated with Saline Topical Anesthetic Applied: Other: lidocaine 4%, Treatment Notes Wound #3 (Left, Distal Lower Leg) 1. Cleansed with: Clean wound with Normal Saline 2. Anesthetic Topical Lidocaine 4% cream to wound bed prior to debridement 4. Dressing Applied: Medihoney Gel 5. Secondary Dressing Applied Guaze, ABD and kerlix/Conform 7. Secured with  Tape Electronic Signature(s) Signed: 03/26/2016 4:31:35 PM By: Montey Hora Entered By: Montey Hora on 03/26/2016 14:52:52 [name] ([id number]) -------------------------------------------------------------------------------- Vitals Details Patient Name: [name] Date of Service: 03/26/2016 2:30 PM Medical Record Number: [id number] Patient Account Number: [id number] Date of Birth/Sex: 30-Apr-1928 (83 y.o. Female) Treating RN: Montey Hora Primary Care Quentin Strebel: Emily Filbert Other Clinician: Referring Russ Looper: Emily Filbert Treating Hawk Mones/Extender: Frann Rider in Treatment: 9 Vital Signs Time Taken: 14:37 Temperature (F): 98.4 Height (in): 59 Pulse (bpm): 78 Weight (lbs): 140 Respiratory Rate (breaths/min): 18 Body Mass Index (BMI): 28.3 Blood Pressure (mmHg): 134/54 Reference Range: 80 - 120 mg /  dl Electronic Signature(s) Signed: 03/26/2016 4:31:35 PM By: Montey Hora Entered By: Montey Hora on 03/26/2016 14:38:40

## 2016-03-27 NOTE — Progress Notes (Signed)
Patient is alert and oriented x 4. Able to verbalize needs. C/o R foot pain. MD notified and order for Xray obtained. Husband at bedside. Crackles noted in Right lower lobe. Diminished breath sounds. Heart sounds normal. Redness and warmth noted to bilateral lower extremities. Healing wounds noted on LLE from previous procedure in October of last year. Scattered bruising on bilateral lower extremities. IVF running at 75 ml/hr. Patient oriented to staff, room and unit. Call bell within reach. Bed in low position and bed alarm activated. Assisted patient with ordering dinner. Will continue to monitor.

## 2016-03-27 NOTE — Progress Notes (Signed)
Pharmacy Antibiotic Note  Natasha [name] is a 83 y.o. female admitted on 03/27/2016 with pneumonia.  Pharmacy has been consulted for Zosyn and vancomycin dosing.  Plan: 1. Zosyn 3.375 gm IV Q8H EI 2. Vancomycin 1 gm IV x 1 followed in approximately 21 hours (stacked dosing) by vancomycin 750 mg IV Q24H, predicted trough 19 mcg/mL. Pharmacy will continue to follow and adjust as needed to maintain trough 15 to 20 mcg/mL.   Vd 34.6 L, Ke 0.031 hr-1, T1/2 22.1 hr  Weight: 139 lb (63 kg)  Temp (24hrs), Avg:100.1 F (37.8 C), Min:100.1 F (37.8 C), Max:100.1 F (37.8 C)   Recent Labs Lab 03/27/16 1148 03/27/16 1149  WBC 11.6*  --   CREATININE 0.99  --   LATICACIDVEN  --  1.3    Estimated Creatinine Clearance: 32.7 mL/min (by C-G formula based on SCr of 0.99 mg/dL).    Allergies  Allergen Reactions  . Codeine Diarrhea  . Hydrocodone Nausea And Vomiting  . Levofloxacin Other (See Comments)    Muscle cramps  . Tramadol Nausea And Vomiting    Thank you for allowing pharmacy to be a part of this patient's care.  Laural Benes, Pharm.D., BCPS Clinical Pharmacist 03/27/2016 2:15 PM

## 2016-03-28 ENCOUNTER — Other Ambulatory Visit (INDEPENDENT_AMBULATORY_CARE_PROVIDER_SITE_OTHER): Payer: Self-pay | Admitting: Vascular Surgery

## 2016-03-28 LAB — CBC
HEMATOCRIT: 30.4 % — AB (ref 35.0–47.0)
HEMOGLOBIN: 10.1 g/dL — AB (ref 12.0–16.0)
MCH: 28.4 pg (ref 26.0–34.0)
MCHC: 33.1 g/dL (ref 32.0–36.0)
MCV: 86 fL (ref 80.0–100.0)
Platelets: 258 10*3/uL (ref 150–440)
RBC: 3.54 MIL/uL — AB (ref 3.80–5.20)
RDW: 18.4 % — AB (ref 11.5–14.5)
WBC: 6.9 10*3/uL (ref 3.6–11.0)

## 2016-03-28 LAB — COMPREHENSIVE METABOLIC PANEL
ALT: 8 U/L — ABNORMAL LOW (ref 14–54)
ANION GAP: 7 (ref 5–15)
AST: 18 U/L (ref 15–41)
Albumin: 3 g/dL — ABNORMAL LOW (ref 3.5–5.0)
Alkaline Phosphatase: 77 U/L (ref 38–126)
BILIRUBIN TOTAL: 0.5 mg/dL (ref 0.3–1.2)
BUN: 20 mg/dL (ref 6–20)
CO2: 34 mmol/L — ABNORMAL HIGH (ref 22–32)
Calcium: 8.6 mg/dL — ABNORMAL LOW (ref 8.9–10.3)
Chloride: 96 mmol/L — ABNORMAL LOW (ref 101–111)
Creatinine, Ser: 0.96 mg/dL (ref 0.44–1.00)
GFR, EST NON AFRICAN AMERICAN: 52 mL/min — AB (ref >60)
Glucose, Bld: 97 mg/dL (ref 65–99)
POTASSIUM: 3.4 mmol/L — AB (ref 3.5–5.1)
Sodium: 137 mmol/L (ref 135–145)
TOTAL PROTEIN: 6.1 g/dL — AB (ref 6.5–8.1)

## 2016-03-28 LAB — GASTROINTESTINAL PANEL BY PCR, STOOL (REPLACES STOOL CULTURE)

## 2016-03-28 LAB — C DIFFICILE QUICK SCREEN W PCR REFLEX
C DIFFICILE (CDIFF) TOXIN: NEGATIVE
C DIFFICLE (CDIFF) ANTIGEN: NEGATIVE
C Diff interpretation: NOT DETECTED

## 2016-03-28 MED ORDER — KCL IN DEXTROSE-NACL 20-5-0.9 MEQ/L-%-% IV SOLN
INTRAVENOUS | Status: DC
  Administered 2016-03-28: 10:00:00 via INTRAVENOUS
  Filled 2016-03-28 (×4): qty 1000

## 2016-03-28 MED ORDER — DIPHENOXYLATE-ATROPINE 2.5-0.025 MG PO TABS: 1.0000 | ORAL_TABLET | Freq: Four times a day (QID) | ORAL | Status: DC | PRN

## 2016-03-28 NOTE — Progress Notes (Signed)
Patient ID: Natasha GWYNNE, female   DOB: 05/22/1928, 83 y.o.   MRN: [id number]  [facility] PROGRESS NOTE  PANTERA WINTERROWD IDP:[id number] DOB: July 07, 1928 DOA: 03/27/2016 PCP: Rusty Aus, MD  HPI/Subjective: Patient came in with pain in her right foot and unable to bear weight. She was diagnosed with cellulitis and started on antibiotics. Also found to have a pneumonia on the right upper lobe. No problems swallowing. Some cough but no shortness of breath.  Also having continuous diarrhea for the last 2 weeks.  Objective: Vitals:   03/28/16 1104 03/28/16 1342  BP: (!) 99/50 (!) 125/52  Pulse: 73 86  Resp: 18 16  Temp: 98.1 F (36.7 C) 97.9 F (36.6 C)    Filed Weights   03/27/16 1139 03/27/16 1541  Weight: 63 kg (139 lb) 63 kg (139 lb)    ROS: Review of Systems  Constitutional: Negative for chills and fever.  Eyes: Negative for blurred vision.  Respiratory: Negative for cough and shortness of breath.   Cardiovascular: Negative for chest pain.  Gastrointestinal: Positive for diarrhea. Negative for abdominal pain, constipation, nausea and vomiting.  Genitourinary: Negative for dysuria.  Musculoskeletal: Positive for joint pain.  Neurological: Positive for weakness. Negative for dizziness and headaches.   Exam: Physical Exam  Constitutional: She is oriented to person, place, and time.  HENT:  Nose: No mucosal edema.  Mouth/Throat: No oropharyngeal exudate or posterior oropharyngeal edema.  Eyes: Conjunctivae, EOM and lids are normal. Pupils are equal, round, and reactive to light.  Neck: No JVD present. Carotid bruit is not present. No edema present. No thyroid mass and no thyromegaly present.  Cardiovascular: S1 normal and S2 normal.  Exam reveals no gallop.   No murmur heard. Pulses:      Dorsalis pedis pulses are 2+ on the right side, and 2+ on the left side.  Respiratory: No respiratory distress. She has no wheezes. She has no rhonchi. She has no rales.   GI: Soft. Bowel sounds are normal. There is no tenderness.  Musculoskeletal:       Right shoulder: She exhibits no swelling.  Lymphadenopathy:    She has no cervical adenopathy.  Neurological: She is alert and oriented to person, place, and time. No cranial nerve deficit.  Skin: Skin is warm. Nails show no clubbing.  Erythema and warmth right ankle and right lower leg. Healing wounds on the left lower leg without signs of infection there.  Psychiatric: She has a normal mood and affect.      Data Reviewed: Basic Metabolic Panel:  Recent Labs Lab 03/27/16 1148 03/28/16 0324  NA 131* 137  K 2.9* 3.4*  CL 90* 96*  CO2 32 34*  GLUCOSE 110* 97  BUN 27* 20  CREATININE 0.99 0.96  CALCIUM 9.1 8.6*   Liver Function Tests:  Recent Labs Lab 03/27/16 1148 03/28/16 0324  AST 20 18  ALT 10* 8*  ALKPHOS 92 77  BILITOT 0.6 0.5  PROT 6.7 6.1*  ALBUMIN 3.6 3.0*   CBC:  Recent Labs Lab 03/27/16 1148 03/28/16 0324  WBC 11.6* 6.9  NEUTROABS 9.8*  --   HGB 10.9* 10.1*  HCT 33.2* 30.4*  MCV 84.6 86.0  PLT 284 258     Recent Results (from the past 240 hour(s))  Culture, blood (Routine x 2)     Status: None (Preliminary result)   Collection Time: 03/27/16 11:48 AM  Result Value Ref Range Status   Specimen Description BLOOD RIGHT HAND  Final  Special Requests BOTTLES DRAWN AEROBIC AND ANAEROBIC BCAV  Final   Culture NO GROWTH < 24 HOURS  Final   Report Status PENDING  Incomplete  Culture, blood (Routine x 2)     Status: None (Preliminary result)   Collection Time: 03/27/16 11:49 AM  Result Value Ref Range Status   Specimen Description BLOOD RIGHT ASSIST CONTROL  Final   Special Requests BOTTLES DRAWN AEROBIC AND ANAEROBIC BCAV  Final   Culture NO GROWTH < 24 HOURS  Final   Report Status PENDING  Incomplete  Gastrointestinal Panel by PCR , Stool     Status: None   Collection Time: 03/28/16  8:45 AM  Result Value Ref Range Status   Campylobacter species NOT DETECTED  NOT DETECTED Final   Plesimonas shigelloides NOT DETECTED NOT DETECTED Final   Salmonella species NOT DETECTED NOT DETECTED Final   Yersinia enterocolitica NOT DETECTED NOT DETECTED Final   Vibrio species NOT DETECTED NOT DETECTED Final   Vibrio cholerae NOT DETECTED NOT DETECTED Final   Enteroaggregative E coli (EAEC) NOT DETECTED NOT DETECTED Final   Enteropathogenic E coli (EPEC) NOT DETECTED NOT DETECTED Final   Enterotoxigenic E coli (ETEC) NOT DETECTED NOT DETECTED Final   Shiga like toxin producing E coli (STEC) NOT DETECTED NOT DETECTED Final   Shigella/Enteroinvasive E coli (EIEC) NOT DETECTED NOT DETECTED Final   Cryptosporidium NOT DETECTED NOT DETECTED Final   Cyclospora cayetanensis NOT DETECTED NOT DETECTED Final   Entamoeba histolytica NOT DETECTED NOT DETECTED Final   Giardia lamblia NOT DETECTED NOT DETECTED Final   Adenovirus F40/41 NOT DETECTED NOT DETECTED Final   Astrovirus NOT DETECTED NOT DETECTED Final   Norovirus GI/GII NOT DETECTED NOT DETECTED Final   Rotavirus A NOT DETECTED NOT DETECTED Final   Sapovirus (I, II, IV, and V) NOT DETECTED NOT DETECTED Final  C difficile quick scan w PCR reflex     Status: None   Collection Time: 03/28/16  8:45 AM  Result Value Ref Range Status   C Diff antigen NEGATIVE NEGATIVE Final   C Diff toxin NEGATIVE NEGATIVE Final   C Diff interpretation No C. difficile detected.  Final     Studies: Ct Angio Chest Pe W And/or Wo Contrast  Result Date: 03/27/2016 CLINICAL DATA:  Chronic respiratory failure, now with profound hypoxia. Chest radiograph worrisome for right lower lobe pneumonia. Evaluate for pulmonary embolism. EXAM: CT ANGIOGRAPHY CHEST WITH CONTRAST TECHNIQUE: Multidetector CT imaging of the chest was performed using the standard protocol during bolus administration of intravenous contrast. Multiplanar CT image reconstructions and MIPs were obtained to evaluate the vascular anatomy. CONTRAST:  75 cc Isovue 370 COMPARISON:   Chest radiograph - earlier same day ; 01/30/2016 FINDINGS: Vascular Findings: There is adequate opacification of the pulmonary arterial system with the main pulmonary artery measuring 474 Hounsfield units. There are no discrete filling defects within the pulmonary arterial tree to suggest pulmonary embolism. Normal caliber of the main pulmonary artery. Borderline cardiomegaly. Coronary artery calcifications. No pericardial effusion. Moderate amount of mixed calcified and noncalcified atherosclerotic plaque with a normal caliber thoracic aorta. Conventional configuration of the aortic arch. The branch vessels of the aortic arch appear patent throughout their imaged course. Review of the MIP images confirms the above findings. ---------------------------------------------------------------------------------- Nonvascular Findings: Mediastinum/Lymph Nodes: Partially calcified left hilar (image 37, series 6 and mediastinal (image 29, series 6) lymph nodes, the sequela of prior glomus infection. Scattered mediastinal lymph nodes are prominent though individually not enlarged by size criteria with  index pre tracheal lymph node measuring 0.7 cm in greatest short axis diameter (image 19, series 4) and index prevascular lymph node measuring 0.8 cm (image 22, series 4). No bulky mediastinal, hilar axillary lymphadenopathy. Lungs/Pleura: Evaluation of the pulmonary parenchyma is degraded secondary to respiratory artifact. Ill-defined heterogeneous appearing airspace opacity within the right upper lobe measuring approximately 4 x 2 cm (image 22, series 6). Ground-glass opacities within the subpleural aspect of the bilateral upper and lower lobes. Subsegmental atelectasis/collapse involving the medial basilar segment the right lower lobe secondary to mass effect from the large hiatal and mesenteric fat containing hernia. Punctate bilateral calcified granuloma with partially calcified right hilar and mediastinal lymph nodes, the  sequela of prior granulomatous infection. The central pulmonary airways are narrowed and partially collapsed still remains patent. Upper abdomen: Large hiatal and mesenteric fat containing hernia. Early arterial phase evaluation of the upper abdomen demonstrates fluid distention within the splenic flexure of the colon. Musculoskeletal: No acute or aggressive osseous abnormalities. Partial ossification involving the T10-T11 vertebral body. Regional soft tissues appear normal. IMPRESSION: 1. Findings worrisome for developing right upper lobe pneumonia. A follow-up chest radiograph in 4-6 weeks after treatment is recommended to ensure resolution. 2. No evidence of pulmonary embolism. 3. Large hiatal hernia. 4. Sequelae of prior granulomatous infection as above. 5.  Aortic Atherosclerosis (ICD10-170.0) Electronically Signed   By: Sandi Mariscal M.D.   On: 03/27/2016 14:06   Dg Chest Portable 1 View  Result Date: 03/27/2016 CLINICAL DATA:  Hypoxia. EXAM: PORTABLE CHEST 1 VIEW COMPARISON:  01/30/2016.  Chest CT dated 03/03/2015. FINDINGS: Enlarged cardiac silhouette with improvement. Large hiatal hernia similar to the previous CT. Mild patchy airspace opacity throughout the majority of the right lung and mild patchy and linear opacity at the left lung base. The interstitial markings remain prominent. No pleural fluid seen. Diffuse osteopenia. Old left clavicle fracture. IMPRESSION: 1. Mildly progressive probable pneumonia in the right lung. 2. Mild patchy and linear atelectasis at the left lung base with improvement. 3. Stable cardiomegaly and chronic interstitial lung disease. 4. Stable large hiatal hernia. Electronically Signed   By: Claudie Revering M.D.   On: 03/27/2016 12:12   Dg Foot Complete Right  Result Date: 03/27/2016 CLINICAL DATA:  RIGHT foot pain EXAM: RIGHT FOOT COMPLETE - 3+ VIEW COMPARISON:  01/19/2016 FINDINGS: No fracture or dislocation of mid foot or forefoot. The phalanges are normal. The calcaneus  is normal. No soft tissue abnormality. Osteopenia IMPRESSION: 1.  No fracture or dislocation. 2. Osteopenia Electronically Signed   By: Suzy Bouchard M.D.   On: 03/27/2016 18:04    Scheduled Meds: . aspirin EC  325 mg Oral QHS  . budesonide  0.5 mg Nebulization BID  . calcium citrate-vitamin D  1 tablet Oral Daily  . cholecalciferol  1,000 Units Oral BH-q7a  . clopidogrel  75 mg Oral Daily  . diltiazem  180 mg Oral BH-q7a  . furosemide  20 mg Oral BID  . gabapentin  300 mg Oral BID  . heparin  5,000 Units Subcutaneous Q8H  . ipratropium-albuterol  3 mL Nebulization QID  . Melatonin  5 mg Oral QHS  . montelukast  10 mg Oral BH-q7a  . pantoprazole  40 mg Oral QAC breakfast  . piperacillin-tazobactam (ZOSYN)  IV  3.375 g Intravenous Q8H  . potassium chloride  40 mEq Oral Daily  . pramipexole  0.5 mg Oral BID  . simvastatin  20 mg Oral QHS  . sodium chloride flush  3  mL Intravenous Q12H  . vancomycin  750 mg Intravenous Q24H   Continuous Infusions: . dextrose 5 % and 0.9 % NaCl with KCl 20 mEq/L 50 mL/hr at 03/28/16 0933    Assessment/Plan:  1. Acute respiratory failure with hypoxia. Pulse oximetry 79% on room air. Patient requiring oxygen supplementation. 2. Pneumonia right upper lobe seen on CT scan. No problems swallowing. Vancomycin and Zosyn will cover. 3. Right lower extremity cellulitis. Vancomycin and Zosyn will cover. 4. Hypokalemia replace potassium in IV fluids. Check a magnesium in the morning. 5. Weakness. Physical therapy evaluation 6. Restless leg  Syndrome on Mirapex 7. Hyperlipidemia unspecified on simvastatin 8. GERD on Protonix 9. Diarrhea. Stool studies negative can start Lomotil. Of note Protonix can cause diarrhea also 10. History of CAD on aspirin, Plavix and simvastatin  Code Status:     Code Status Orders        Start     Ordered   03/27/16 1541  Full code  Continuous     03/27/16 1540    Code Status History    Date Active Date Inactive  Code Status Order ID Comments User Context   01/30/2016  6:06 PM 02/02/2016  3:20 PM Full Code [id number]  Henreitta Leber, MD Inpatient   08/24/2015  9:16 PM 08/25/2015 10:24 AM Full Code [id number]  Gladstone Lighter, MD Inpatient   06/27/2015  4:42 PM 06/30/2015  4:16 PM Partial Code [id number]  Bettey Costa, MD Inpatient   06/07/2015  8:08 PM 06/11/2015  7:26 PM Full Code [id number]  Fritzi Mandes, MD Inpatient   05/22/2015  1:52 PM 05/24/2015  4:12 PM Full Code [id number]  Algernon Huxley, MD Inpatient   03/11/2015  6:20 PM 03/16/2015  2:15 PM Full Code [id number]  Fritzi Mandes, MD Inpatient   02/27/2015  3:53 PM 03/03/2015  6:24 PM Full Code [id number]  Sela Hua, PA-C Inpatient    Advance Directive Documentation     Most Recent Value  Type of Advance Directive  Healthcare Power of [place], Living will  Pre-existing out of facility DNR order (yellow form or pink MOST form)  -  "MOST" Form in Place?  -     Disposition Plan: to be determined  Antibiotics:  Vancomycin  Zosyn  Time spent: 28 minutes  [place], [place]

## 2016-03-29 LAB — URIC ACID: Uric Acid, Serum: 5.4 mg/dL (ref 2.3–6.6)

## 2016-03-29 MED ORDER — IPRATROPIUM-ALBUTEROL 0.5-2.5 (3) MG/3ML IN SOLN
3.0000 mL | Freq: Three times a day (TID) | RESPIRATORY_TRACT | Status: DC
  Administered 2016-03-29 – 2016-03-31 (×7): 3 mL via RESPIRATORY_TRACT
  Filled 2016-03-29 (×7): qty 3

## 2016-03-29 MED ORDER — PREDNISONE 10 MG PO TABS
10.0000 mg | ORAL_TABLET | Freq: Once | ORAL | Status: AC
  Administered 2016-03-29: 10 mg via ORAL
  Filled 2016-03-29: qty 1

## 2016-03-29 MED ORDER — CALCIUM CARBONATE-VITAMIN D 500-200 MG-UNIT PO TABS
1.0000 | ORAL_TABLET | Freq: Every day | ORAL | Status: DC
  Administered 2016-03-29 – 2016-03-31 (×3): 1 via ORAL
  Filled 2016-03-29 (×3): qty 1

## 2016-03-29 MED ORDER — CALCIUM CITRATE-VITAMIN D 500-400 MG-UNIT PO CHEW
1.0000 | CHEWABLE_TABLET | Freq: Every day | ORAL | Status: DC
  Filled 2016-03-29: qty 1

## 2016-03-29 NOTE — Care Management Note (Addendum)
Case Management Note  Patient Details  Name: Natasha [name] MRN: [id number] Date of Birth: 08/25/1928  Subjective/Objective:   Spoke with patient and she states she is active with [facility] for home health nursing, PT and HHA. [facility] confirmed this. PT recommending home health. Will need resumption of care orders at discharge.                  Action/Plan:   Expected Discharge Date:                  Expected Discharge Plan:  [place]  In-House Referral:     Discharge planning Services  CM Consult  Post Acute Care Choice:  Home Health, Resumption of Svcs/PTA Provider Choice offered to:     DME Arranged:    DME Agency:     HH Arranged:  RN, PT, Nurse's Aide [place] Agency:  [facility] (now [facility])  Status of Service:  In process, will continue to follow  If discussed at Long Length of Stay Meetings, dates discussed:    Additional Comments:  Jolly Mango, RN 03/29/2016, 3:02 PM

## 2016-03-29 NOTE — Progress Notes (Signed)
Patient ID: Natasha [name], female   DOB: 02/09/1928, 83 y.o.   MRN: [id number]  [facility] PROGRESS NOTE  Natasha [name] YQM:[id number] DOB: 1928/04/29 DOA: 03/27/2016 PCP: Rusty Aus, MD  HPI/Subjective: Patient still having a lot of pain in her right lateral ankle. Still having diarrhea. Still feeling very weak. Stated she did not do well with physical therapy.  Objective: Vitals:   03/29/16 0327 03/29/16 0819  BP: (!) 104/49 (!) 101/41  Pulse: 64 61  Resp: 19   Temp: 99.2 F (37.3 C) 97.9 F (36.6 C)    Filed Weights   03/27/16 1139 03/27/16 1541 03/29/16 0326  Weight: 63 kg (139 lb) 63 kg (139 lb) 64.5 kg (142 lb 1.6 oz)    ROS: Review of Systems  Constitutional: Negative for chills and fever.  Eyes: Negative for blurred vision.  Respiratory: Negative for cough and shortness of breath.   Cardiovascular: Negative for chest pain.  Gastrointestinal: Positive for diarrhea. Negative for abdominal pain, constipation, nausea and vomiting.  Genitourinary: Negative for dysuria.  Musculoskeletal: Positive for joint pain.  Neurological: Positive for weakness. Negative for dizziness and headaches.   Exam: Physical Exam  Constitutional: She is oriented to person, place, and time.  HENT:  Nose: No mucosal edema.  Mouth/Throat: No oropharyngeal exudate or posterior oropharyngeal edema.  Eyes: Conjunctivae, EOM and lids are normal. Pupils are equal, round, and reactive to light.  Neck: No JVD present. Carotid bruit is not present. No edema present. No thyroid mass and no thyromegaly present.  Cardiovascular: S1 normal and S2 normal.  Exam reveals no gallop.   No murmur heard. Pulses:      Dorsalis pedis pulses are 2+ on the right side, and 2+ on the left side.  Respiratory: No respiratory distress. She has no wheezes. She has no rhonchi. She has no rales.  GI: Soft. Bowel sounds are normal. There is no tenderness.  Musculoskeletal:       Right shoulder: She  exhibits no swelling.  Lymphadenopathy:    She has no cervical adenopathy.  Neurological: She is alert and oriented to person, place, and time. No cranial nerve deficit.  Skin: Skin is warm. Nails show no clubbing.  Erythema and warmth right ankle. Pain right lateral ankle to palpation and movement Healing wounds on the left lower leg without signs of infection there.  Psychiatric: She has a normal mood and affect.      Data Reviewed: Basic Metabolic Panel:  Recent Labs Lab 03/27/16 1148 03/28/16 0324  NA 131* 137  K 2.9* 3.4*  CL 90* 96*  CO2 32 34*  GLUCOSE 110* 97  BUN 27* 20  CREATININE 0.99 0.96  CALCIUM 9.1 8.6*   Liver Function Tests:  Recent Labs Lab 03/27/16 1148 03/28/16 0324  AST 20 18  ALT 10* 8*  ALKPHOS 92 77  BILITOT 0.6 0.5  PROT 6.7 6.1*  ALBUMIN 3.6 3.0*   CBC:  Recent Labs Lab 03/27/16 1148 03/28/16 0324  WBC 11.6* 6.9  NEUTROABS 9.8*  --   HGB 10.9* 10.1*  HCT 33.2* 30.4*  MCV 84.6 86.0  PLT 284 258     Recent Results (from the past 240 hour(s))  Culture, blood (Routine x 2)     Status: None (Preliminary result)   Collection Time: 03/27/16 11:48 AM  Result Value Ref Range Status   Specimen Description BLOOD RIGHT HAND  Final   Special Requests BOTTLES DRAWN AEROBIC AND ANAEROBIC BCAV  Final  Culture NO GROWTH 2 DAYS  Final   Report Status PENDING  Incomplete  Culture, blood (Routine x 2)     Status: None (Preliminary result)   Collection Time: 03/27/16 11:49 AM  Result Value Ref Range Status   Specimen Description BLOOD RIGHT ASSIST CONTROL  Final   Special Requests BOTTLES DRAWN AEROBIC AND ANAEROBIC BCAV  Final   Culture NO GROWTH 2 DAYS  Final   Report Status PENDING  Incomplete  Gastrointestinal Panel by PCR , Stool     Status: None   Collection Time: 03/28/16  8:45 AM  Result Value Ref Range Status   Campylobacter species NOT DETECTED NOT DETECTED Final   Plesimonas shigelloides NOT DETECTED NOT DETECTED Final    Salmonella species NOT DETECTED NOT DETECTED Final   Yersinia enterocolitica NOT DETECTED NOT DETECTED Final   Vibrio species NOT DETECTED NOT DETECTED Final   Vibrio cholerae NOT DETECTED NOT DETECTED Final   Enteroaggregative E coli (EAEC) NOT DETECTED NOT DETECTED Final   Enteropathogenic E coli (EPEC) NOT DETECTED NOT DETECTED Final   Enterotoxigenic E coli (ETEC) NOT DETECTED NOT DETECTED Final   Shiga like toxin producing E coli (STEC) NOT DETECTED NOT DETECTED Final   Shigella/Enteroinvasive E coli (EIEC) NOT DETECTED NOT DETECTED Final   Cryptosporidium NOT DETECTED NOT DETECTED Final   Cyclospora cayetanensis NOT DETECTED NOT DETECTED Final   Entamoeba histolytica NOT DETECTED NOT DETECTED Final   Giardia lamblia NOT DETECTED NOT DETECTED Final   Adenovirus F40/41 NOT DETECTED NOT DETECTED Final   Astrovirus NOT DETECTED NOT DETECTED Final   Norovirus GI/GII NOT DETECTED NOT DETECTED Final   Rotavirus A NOT DETECTED NOT DETECTED Final   Sapovirus (I, II, IV, and V) NOT DETECTED NOT DETECTED Final  C difficile quick scan w PCR reflex     Status: None   Collection Time: 03/28/16  8:45 AM  Result Value Ref Range Status   C Diff antigen NEGATIVE NEGATIVE Final   C Diff toxin NEGATIVE NEGATIVE Final   C Diff interpretation No C. difficile detected.  Final     Studies: Dg Foot Complete Right  Result Date: 03/27/2016 CLINICAL DATA:  RIGHT foot pain EXAM: RIGHT FOOT COMPLETE - 3+ VIEW COMPARISON:  01/19/2016 FINDINGS: No fracture or dislocation of mid foot or forefoot. The phalanges are normal. The calcaneus is normal. No soft tissue abnormality. Osteopenia IMPRESSION: 1.  No fracture or dislocation. 2. Osteopenia Electronically Signed   By: Suzy Bouchard M.D.   On: 03/27/2016 18:04    Scheduled Meds: . aspirin EC  325 mg Oral QHS  . budesonide  0.5 mg Nebulization BID  . calcium-vitamin D  1 tablet Oral Q breakfast  . cholecalciferol  1,000 Units Oral BH-q7a  . clopidogrel   75 mg Oral Daily  . diltiazem  180 mg Oral BH-q7a  . furosemide  20 mg Oral BID  . gabapentin  300 mg Oral BID  . heparin  5,000 Units Subcutaneous Q8H  . ipratropium-albuterol  3 mL Nebulization TID  . Melatonin  5 mg Oral QHS  . montelukast  10 mg Oral BH-q7a  . pantoprazole  40 mg Oral QAC breakfast  . piperacillin-tazobactam (ZOSYN)  IV  3.375 g Intravenous Q8H  . potassium chloride  40 mEq Oral Daily  . pramipexole  0.5 mg Oral BID  . predniSONE  10 mg Oral Once  . simvastatin  20 mg Oral QHS  . sodium chloride flush  3 mL Intravenous Q12H  .  vancomycin  750 mg Intravenous Q24H   Continuous Infusions: . dextrose 5 % and 0.9 % NaCl with KCl 20 mEq/L 50 mL/hr at 03/28/16 0933    Assessment/Plan:  1. Acute On chronic respiratory failure with hypoxia. Pulse oximetry 79% on room air on presentation. Patient requiring oxygen supplementation. 2. Pneumonia right upper lobe seen on CT scan. No problems swallowing. Vancomycin and Zosyn will cover. 3. Right lower extremity cellulitis. Vancomycin and Zosyn will cover.  I will check a uric acid and give 1 dose of prednisone just in case this is gout but more likely cellulitis 4. Hypokalemia replace potassium in IV fluids. 5. Weakness. Physical therapy recommends home with home health 6. Restless leg  Syndrome on Mirapex 7. Hyperlipidemia unspecified on simvastatin 8. GERD on Protonix 9. Diarrhea. Stool studies negative can start Lomotil. Patient stated that once she started magnesium the diarrhea started. Patient not on magnesium here. 10. History of CAD on aspirin, Plavix and simvastatin  Code Status:     Code Status Orders        Start     Ordered   03/27/16 1541  Full code  Continuous     03/27/16 1540    Code Status History    Date Active Date Inactive Code Status Order ID Comments User Context   01/30/2016  6:06 PM 02/02/2016  3:20 PM Full Code [id number]  Henreitta Leber, MD Inpatient   08/24/2015  9:16 PM 08/25/2015 10:24 AM  Full Code [id number]  Gladstone Lighter, MD Inpatient   06/27/2015  4:42 PM 06/30/2015  4:16 PM Partial Code [id number]  Bettey Costa, MD Inpatient   06/07/2015  8:08 PM 06/11/2015  7:26 PM Full Code [id number]  Fritzi Mandes, MD Inpatient   05/22/2015  1:52 PM 05/24/2015  4:12 PM Full Code [id number]  Algernon Huxley, MD Inpatient   03/11/2015  6:20 PM 03/16/2015  2:15 PM Full Code [id number]  Fritzi Mandes, MD Inpatient   02/27/2015  3:53 PM 03/03/2015  6:24 PM Full Code [id number]  Sela Hua, PA-C Inpatient    Advance Directive Documentation     Most Recent Value  Type of Advance Directive  Healthcare Power of [place], Living will  Pre-existing out of facility DNR order (yellow form or pink MOST form)  -  "MOST" Form in Place?  -     Disposition Plan: Home in the next 1-2 days depending on progress  Antibiotics:  Vancomycin  Zosyn  Time spent: 26 minutes  [place], [place]

## 2016-03-29 NOTE — Clinical Social Work Note (Signed)
Patient is from [place] updated case [employer].  CSW received referral for SNF.  Case discussed with case manager and plan is to discharge home with home health.  CSW to sign off please re-consult if social work needs arise.  Jones Broom. [place], MSW, [place]

## 2016-03-29 NOTE — Evaluation (Signed)
Physical Therapy Evaluation Patient Details Name: Natasha [name] MRN: [id number] DOB: [birth year] Today's Date: 03/29/2016   History of Present Illness  Pt is an 83 y.o. female presenting to hospital with R LE pain and swelling, fevers, hypoxic, and diarrhea last 2 weeks.  Pt admitted to hospital with R LL PNA, R LE cellultis, hypokalemia, and acute respiratory failure with hypoxia.  PMH includes L LE bypass surgery Oct 2017, hiatal hernia, B cochlear implants, htn, CKD stage 5, RLS, polyneuropathy, kyphoplasty.  Clinical Impression  Prior to hospital admission, pt was modified independent ambulating with rollator in home and used RW in community.  Pt lives with her husband in [place].  Currently pt is CGA with transfers and ambulation 50 feet with RW; ambulation distance limited d/t R foot pain.  Pt's O2 sats 86% on room air but 90% post ambulation on 2 L O2 via nasal cannula.  Pt would benefit from skilled PT to address noted impairments and functional limitations.  Recommend pt discharge to home with HHPT when medically appropriate.    Follow Up Recommendations Home health PT;Supervision for mobility/OOB    Equipment Recommendations  Rolling walker with 5" wheels (pt reports already owning RW)    Recommendations for Other Services       Precautions / Restrictions Precautions Precautions: Fall Restrictions Weight Bearing Restrictions: No      Mobility  Bed Mobility               General bed mobility comments: Deferred d/t pt up in chair beginning and end of session.  Transfers Overall transfer level: Needs assistance Equipment used: Rolling walker (2 wheeled) Transfers: Sit to/from Stand Sit to Stand: Min guard         General transfer comment: increased effort to stand but steady without loss of balance  Ambulation/Gait Ambulation/Gait assistance: Min guard Ambulation Distance (Feet): 50 Feet Assistive device: Rolling walker (2  wheeled) Gait Pattern/deviations: Step-to pattern Gait velocity: decreased   General Gait Details: decreased stance time R LE; pt sliding R forefoot forward to advance R LE requiring vc's to increase step height intermittently; pt with inconsistent L foot clearance (decreased foot clearance vs sliding foot forward to advance); vc's intermittently to increase UE support through RW to offweight R LE d/t pain  Stairs            Wheelchair Mobility    Modified Rankin (Stroke Patients Only)       Balance Overall balance assessment: Needs assistance Sitting-balance support: No upper extremity supported;Feet supported Sitting balance-Leahy Scale: Good     Standing balance support: Bilateral upper extremity supported;During functional activity (UE supported on RW) Standing balance-Leahy Scale: Good Standing balance comment: during ambulation                             Pertinent Vitals/Pain Pain Assessment: 0-10 Pain Score: 3  Pain Location: R foot up to knee Pain Descriptors / Indicators: Burning Pain Intervention(s): Limited activity within patient's tolerance;Monitored during session;Repositioned  HR WFL during session.    Home Living Family/patient expects to be discharged to:: Private residence Living Arrangements: Spouse/significant other Available Help at Discharge: Family Type of Home: Independent living facility Home Access: Level entry     Home Layout: One [place]: Environmental consultant - 2 wheels;Walker - 4 wheels Additional Comments: [facility]    Prior Function Level of Independence: Independent with assistive device(s)  Comments: Pt lives with independent 22 y.o. husband.  Uses 4ww in house and RW in community.  Has aide 2x/week for bathing/personal assist; nursing 2x/week for wounds; and HHPT 2x/week.  Uses 2 L home O2 at night.     Hand Dominance        Extremity/Trunk Assessment   Upper Extremity  Assessment Upper Extremity Assessment: Generalized weakness    Lower Extremity Assessment Lower Extremity Assessment: Generalized weakness       Communication   Communication: HOH  Cognition Arousal/Alertness: Awake/alert Behavior During Therapy: WFL for tasks assessed/performed Overall Cognitive Status: Within Functional Limits for tasks assessed                      General Comments   Nursing cleared pt for participation in physical therapy.  Pt agreeable to PT session.    Exercises  Gait training   Assessment/Plan    PT Assessment Patient needs continued PT services  PT Problem List Decreased strength;Decreased activity tolerance;Decreased balance;Decreased mobility;Pain       PT Treatment Interventions DME instruction;Gait training;Functional mobility training;Therapeutic activities;Therapeutic exercise;Balance training;Patient/family education    PT Goals (Current goals can be found in the Care Plan section)  Acute Rehab PT Goals Patient Stated Goal: to go home PT Goal Formulation: With patient Time For Goal Achievement: 04/12/16 Potential to Achieve Goals: Good    Frequency Min 2X/week   Barriers to discharge        Co-evaluation               End of Session Equipment Utilized During Treatment: Gait belt;Oxygen Activity Tolerance: Patient limited by pain Patient left: in chair;with call bell/phone within reach;with chair alarm set Nurse Communication: Mobility status;Precautions PT Visit Diagnosis: Difficulty in walking, not elsewhere classified (R26.2);Muscle weakness (generalized) (M62.81)         Time: 8295-6213 PT Time Calculation (min) (ACUTE ONLY): 28 min   Charges:   PT Evaluation $PT Eval Low Complexity: 1 Procedure PT Treatments $Gait Training: 8-22 mins   PT G CodesLeitha Bleak, PT 03/29/16, 1:25 PM [phone]

## 2016-03-30 LAB — BASIC METABOLIC PANEL
ANION GAP: 9 (ref 5–15)
BUN: 14 mg/dL (ref 6–20)
CHLORIDE: 99 mmol/L — AB (ref 101–111)
CO2: 31 mmol/L (ref 22–32)
Calcium: 9.2 mg/dL (ref 8.9–10.3)
Creatinine, Ser: 0.96 mg/dL (ref 0.44–1.00)
GFR calc Af Amer: >60 mL/min (ref >60)
GFR calc non Af Amer: 52 mL/min — ABNORMAL LOW (ref >60)
GLUCOSE: 114 mg/dL — AB (ref 65–99)
Potassium: 3.6 mmol/L (ref 3.5–5.1)
Sodium: 139 mmol/L (ref 135–145)

## 2016-03-30 LAB — MAGNESIUM: MAGNESIUM: 1.6 mg/dL — AB (ref 1.7–2.4)

## 2016-03-30 MED ORDER — MAGNESIUM SULFATE 2 GM/50ML IV SOLN
2.0000 g | Freq: Once | INTRAVENOUS | Status: AC
  Administered 2016-03-30: 2 g via INTRAVENOUS
  Filled 2016-03-30: qty 50

## 2016-03-30 NOTE — Progress Notes (Signed)
Pt requesting to have telemetry removed. MD [place] notified. MD placed orders to d/c monitor. Tel d/c

## 2016-03-30 NOTE — Progress Notes (Signed)
Patient ID: RAVENNE WAYMENT, female   DOB: March 05, 1928, 83 y.o.   MRN: [id number]   [facility] PROGRESS NOTE  Natasha [name] EQA:[id number] DOB: [birth year] DOA: 03/27/2016 PCP: Rusty Aus, MD  HPI/Subjective: Patient's still having pain in her right ankle. She states it's better but she had a hard time walking around today. She was all short of breath today and had a coughing spell this morning. Patient states her diarrhea has resolved.  Objective: Vitals:   03/30/16 0749 03/30/16 1145  BP: (!) 145/63 (!) 110/45  Pulse: 68 70  Resp: 18 17  Temp: 98.8 F (37.1 C) 97.8 F (36.6 C)    Filed Weights   03/27/16 1139 03/27/16 1541 03/29/16 0326  Weight: 63 kg (139 lb) 63 kg (139 lb) 64.5 kg (142 lb 1.6 oz)    ROS: Review of Systems  Constitutional: Negative for chills and fever.  Eyes: Negative for blurred vision.  Respiratory: Positive for cough and shortness of breath.   Cardiovascular: Negative for chest pain.  Gastrointestinal: Negative for abdominal pain, constipation, diarrhea, nausea and vomiting.  Genitourinary: Negative for dysuria.  Musculoskeletal: Positive for joint pain.  Neurological: Positive for weakness. Negative for dizziness and headaches.   Exam: Physical Exam  Constitutional: She is oriented to person, place, and time.  HENT:  Nose: No mucosal edema.  Mouth/Throat: No oropharyngeal exudate or posterior oropharyngeal edema.  Eyes: Conjunctivae, EOM and lids are normal. Pupils are equal, round, and reactive to light.  Neck: No JVD present. Carotid bruit is not present. No edema present. No thyroid mass and no thyromegaly present.  Cardiovascular: S1 normal and S2 normal.  Exam reveals no gallop.   No murmur heard. Pulses:      Dorsalis pedis pulses are 2+ on the right side, and 2+ on the left side.  Respiratory: No respiratory distress. She has no wheezes. She has no rhonchi. She has no rales.  GI: Soft. Bowel sounds are normal. There is no  tenderness.  Musculoskeletal:       Right ankle: She exhibits no swelling.       Left ankle: She exhibits no swelling.  Lymphadenopathy:    She has no cervical adenopathy.  Neurological: She is alert and oriented to person, place, and time. No cranial nerve deficit.  Skin: Skin is warm. Nails show no clubbing.  Erythema is much improved right lower extremity. Still has pain to palpation in the right ankle.  Psychiatric: She has a normal mood and affect.      Data Reviewed: Basic Metabolic Panel:  Recent Labs Lab 03/27/16 1148 03/28/16 0324 03/30/16 0450  NA 131* 137 139  K 2.9* 3.4* 3.6  CL 90* 96* 99*  CO2 32 34* 31  GLUCOSE 110* 97 114*  BUN 27* 20 14  CREATININE 0.99 0.96 0.96  CALCIUM 9.1 8.6* 9.2  MG  --   --  1.6*   Liver Function Tests:  Recent Labs Lab 03/27/16 1148 03/28/16 0324  AST 20 18  ALT 10* 8*  ALKPHOS 92 77  BILITOT 0.6 0.5  PROT 6.7 6.1*  ALBUMIN 3.6 3.0*   CBC:  Recent Labs Lab 03/27/16 1148 03/28/16 0324  WBC 11.6* 6.9  NEUTROABS 9.8*  --   HGB 10.9* 10.1*  HCT 33.2* 30.4*  MCV 84.6 86.0  PLT 284 258     Recent Results (from the past 240 hour(s))  Culture, blood (Routine x 2)     Status: None (Preliminary result)  Collection Time: 03/27/16 11:48 AM  Result Value Ref Range Status   Specimen Description BLOOD RIGHT HAND  Final   Special Requests BOTTLES DRAWN AEROBIC AND ANAEROBIC BCAV  Final   Culture NO GROWTH 3 DAYS  Final   Report Status PENDING  Incomplete  Culture, blood (Routine x 2)     Status: None (Preliminary result)   Collection Time: 03/27/16 11:49 AM  Result Value Ref Range Status   Specimen Description BLOOD RIGHT ASSIST CONTROL  Final   Special Requests BOTTLES DRAWN AEROBIC AND ANAEROBIC BCAV  Final   Culture NO GROWTH 3 DAYS  Final   Report Status PENDING  Incomplete  Gastrointestinal Panel by PCR , Stool     Status: None   Collection Time: 03/28/16  8:45 AM  Result Value Ref Range Status    Campylobacter species NOT DETECTED NOT DETECTED Final   Plesimonas shigelloides NOT DETECTED NOT DETECTED Final   Salmonella species NOT DETECTED NOT DETECTED Final   Yersinia enterocolitica NOT DETECTED NOT DETECTED Final   Vibrio species NOT DETECTED NOT DETECTED Final   Vibrio cholerae NOT DETECTED NOT DETECTED Final   Enteroaggregative E coli (EAEC) NOT DETECTED NOT DETECTED Final   Enteropathogenic E coli (EPEC) NOT DETECTED NOT DETECTED Final   Enterotoxigenic E coli (ETEC) NOT DETECTED NOT DETECTED Final   Shiga like toxin producing E coli (STEC) NOT DETECTED NOT DETECTED Final   Shigella/Enteroinvasive E coli (EIEC) NOT DETECTED NOT DETECTED Final   Cryptosporidium NOT DETECTED NOT DETECTED Final   Cyclospora cayetanensis NOT DETECTED NOT DETECTED Final   Entamoeba histolytica NOT DETECTED NOT DETECTED Final   Giardia lamblia NOT DETECTED NOT DETECTED Final   Adenovirus F40/41 NOT DETECTED NOT DETECTED Final   Astrovirus NOT DETECTED NOT DETECTED Final   Norovirus GI/GII NOT DETECTED NOT DETECTED Final   Rotavirus A NOT DETECTED NOT DETECTED Final   Sapovirus (I, II, IV, and V) NOT DETECTED NOT DETECTED Final  C difficile quick scan w PCR reflex     Status: None   Collection Time: 03/28/16  8:45 AM  Result Value Ref Range Status   C Diff antigen NEGATIVE NEGATIVE Final   C Diff toxin NEGATIVE NEGATIVE Final   C Diff interpretation No C. difficile detected.  Final     Scheduled Meds: . aspirin EC  325 mg Oral QHS  . budesonide  0.5 mg Nebulization BID  . calcium-vitamin D  1 tablet Oral Q breakfast  . cholecalciferol  1,000 Units Oral BH-q7a  . clopidogrel  75 mg Oral Daily  . diltiazem  180 mg Oral BH-q7a  . furosemide  20 mg Oral BID  . gabapentin  300 mg Oral BID  . heparin  5,000 Units Subcutaneous Q8H  . ipratropium-albuterol  3 mL Nebulization TID  . Melatonin  5 mg Oral QHS  . montelukast  10 mg Oral BH-q7a  . pantoprazole  40 mg Oral QAC breakfast  .  piperacillin-tazobactam (ZOSYN)  IV  3.375 g Intravenous Q8H  . potassium chloride  40 mEq Oral Daily  . pramipexole  0.5 mg Oral BID  . simvastatin  20 mg Oral QHS  . sodium chloride flush  3 mL Intravenous Q12H  . vancomycin  750 mg Intravenous Q24H      Assessment/Plan:  1. Acute on chronic respiratory failure with hypoxia. Pulse oximetry 79% on room air on presentation. Patient requiring oxygen supplementation. 2. Pneumonia right upper lobe seen on CT scan. No problems swallowing. Vancomycin and  Zosyn will cover. 3. Right lower extremity cellulitis. Vancomycin and Zosyn will cover.  Cellulitis has improved but the patient still having pain to palpation over the right ankle. Also having difficulty with walking. We'll give 1 more day of IV antibiotics and hopefully convert over to oral tomorrow. 4. Hypomagnesemia replace magnesium IV 1 today. 5. Hypokalemia replaced 6. Weakness. Physical therapy recommends home with home health 7. Restless leg  Syndrome on Mirapex 8. Hyperlipidemia unspecified on simvastatin 9. GERD on Protonix 10. Diarrhea. Stool studies are negative and diarrhea has resolved. Patient thinks it's the magnesium that she was on orally at home. 11. History of CAD on aspirin, Plavix and simvastatin  Code Status:     Code Status Orders        Start     Ordered   03/27/16 1541  Full code  Continuous     03/27/16 1540    Code Status History    Date Active Date Inactive Code Status Order ID Comments User Context   01/30/2016  6:06 PM 02/02/2016  3:20 PM Full Code [id number]  Henreitta Leber, MD Inpatient   08/24/2015  9:16 PM 08/25/2015 10:24 AM Full Code [id number]  Gladstone Lighter, MD Inpatient   06/27/2015  4:42 PM 06/30/2015  4:16 PM Partial Code [id number]  Bettey Costa, MD Inpatient   06/07/2015  8:08 PM 06/11/2015  7:26 PM Full Code [id number]  Fritzi Mandes, MD Inpatient   05/22/2015  1:52 PM 05/24/2015  4:12 PM Full Code [id number]  Algernon Huxley, MD Inpatient   03/11/2015   6:20 PM 03/16/2015  2:15 PM Full Code [id number]  Fritzi Mandes, MD Inpatient   02/27/2015  3:53 PM 03/03/2015  6:24 PM Full Code [id number]  Sela Hua, PA-C Inpatient    Advance Directive Documentation     Most Recent Value  Type of Advance Directive  Healthcare Power of [place], Living will  Pre-existing out of facility DNR order (yellow form or pink MOST form)  -  "MOST" Form in Place?  -     Disposition Plan: Home Hopefully tomorrow  Antibiotics:  Vancomycin  Zosyn  Time spent: 24 minutes  Lucien, [place]

## 2016-03-31 LAB — BASIC METABOLIC PANEL
ANION GAP: 6 (ref 5–15)
BUN: 15 mg/dL (ref 6–20)
CHLORIDE: 100 mmol/L — AB (ref 101–111)
CO2: 32 mmol/L (ref 22–32)
Calcium: 9 mg/dL (ref 8.9–10.3)
Creatinine, Ser: 0.75 mg/dL (ref 0.44–1.00)
GFR calc Af Amer: >60 mL/min (ref >60)
GFR calc non Af Amer: >60 mL/min (ref >60)
GLUCOSE: 92 mg/dL (ref 65–99)
POTASSIUM: 3.7 mmol/L (ref 3.5–5.1)
Sodium: 138 mmol/L (ref 135–145)

## 2016-03-31 LAB — MAGNESIUM: Magnesium: 1.9 mg/dL (ref 1.7–2.4)

## 2016-03-31 LAB — VANCOMYCIN, TROUGH: Vancomycin Tr: 11 ug/mL — ABNORMAL LOW (ref 15–20)

## 2016-03-31 MED ORDER — METHYLPREDNISOLONE SODIUM SUCC 40 MG IJ SOLR
40.0000 mg | Freq: Once | INTRAMUSCULAR | Status: AC
  Administered 2016-03-31: 40 mg via INTRAVENOUS
  Filled 2016-03-31: qty 1

## 2016-03-31 MED ORDER — DOXYCYCLINE HYCLATE 100 MG PO TABS
100.0000 mg | ORAL_TABLET | Freq: Two times a day (BID) | ORAL | Status: DC
  Administered 2016-03-31: 100 mg via ORAL
  Filled 2016-03-31: qty 1

## 2016-03-31 MED ORDER — DOXYCYCLINE HYCLATE 100 MG PO TABS: 100.0000 mg | ORAL_TABLET | Freq: Two times a day (BID) | ORAL | Status: DC

## 2016-03-31 MED ORDER — DOXYCYCLINE HYCLATE 100 MG PO TABS: 100.0000 mg | ORAL_TABLET | Freq: Two times a day (BID) | ORAL | 0 refills | Status: DC

## 2016-03-31 MED ORDER — POTASSIUM CHLORIDE CRYS ER 20 MEQ PO TBCR: 20.0000 meq | EXTENDED_RELEASE_TABLET | ORAL | Status: DC

## 2016-03-31 MED ORDER — DIPHENHYDRAMINE HCL 50 MG/ML IJ SOLN
12.5000 mg | Freq: Once | INTRAMUSCULAR | Status: AC
  Administered 2016-03-31: 12.5 mg via INTRAVENOUS
  Filled 2016-03-31: qty 1

## 2016-03-31 NOTE — Discharge Summary (Signed)
[facility] [place] NAME: Natasha [name]    MR#:  [id number]  DATE OF BIRTH:  08/04/1928  DATE OF ADMISSION:  03/27/2016 ADMITTING PHYSICIAN: Idelle Crouch, MD  DATE OF DISCHARGE: 03/31/2016  PRIMARY CARE PHYSICIAN: Rusty Aus, MD    ADMISSION DIAGNOSIS:  foot pain  DISCHARGE DIAGNOSIS:  Principal Problem:   Acute respiratory failure (HCC) Active Problems:   CAP (community acquired pneumonia)   Cellulitis   Hypokalemia   SECONDARY DIAGNOSIS:   Past Medical History:  Diagnosis Date  . Anemia   . Arthritis   . Asthma   . Bilateral pneumonia may 2017  . Bronchitis   . Cardiomegaly   . Chronic respiratory failure with hypoxia ([place])   . Cochlear implant in place    bilateral, [name], 04/26/2007   . Gallstones 2015  . GERD (gastroesophageal reflux disease)   . H/O blood clots 2014  . History of home oxygen therapy    at night  . Hypertension   . Hypokalemia   . Peripheral vascular disease ([place])    with arterial clots- on xarelto  . Polyneuropathy ([place])   . Renal disorder    Chronic Kidney Disease, Stage 5  . Restless leg syndrome     HOSPITAL COURSE:   1. Acute on chronic respiratory failure with hypoxia. Pulse ox of 99% on room air on presentation. Patient states she wears oxygen more as needed at home. 2. Pneumonia right upper lobe seen on CT scan. No problems swallowing. Vancomycin and Zosyn wears placed on admission. Switch over to doxycycline upon discharge home. Recommend a repeat chest x-ray as outpatient in about 6 weeks. 3. Right lower extremity cellulitis. Patient placed on vancomycin and Zosyn by admitting physician. Cellulitis has improved but the patient still having a little pain to her lateral right ankle. Redness has subsided. Switch over to doxycycline upon discharge home. 4. Hypomagnesemia during the hospital course. Replaced with IV magnesium. 5. Hypokalemia this was replaced during the  hospital course. 6. Weakness physical therapy recommends home with home health and I ordered this upon discharge. 7. Restless leg syndrome on Mirapex 8. IV infiltration on the left arm. I gave an ice pack. I advised her she can ice it at home. I gave some Benadryl for itching and 1 dose of Solu-Medrol. 9. Hyperlipidemia unspecified on simvastatin 10. GERD on Protonix 11. Diarrhea as outpatient prior to coming into the hospital for about 2 weeks. Stool studies were negative. The patient thinks it's the magnesium that she was taking at home. I also got rid of anticonstipation medications Colace and MiraLAX. 12. History of coronary artery disease on aspirin and Plavix and simvastatin  DISCHARGE CONDITIONS:   Satisfactory  CONSULTS OBTAINED:   none  DRUG ALLERGIES:   Allergies  Allergen Reactions  . Codeine Diarrhea  . Hydrocodone Nausea And Vomiting  . Levofloxacin Other (See Comments)    Muscle cramps  . Tramadol Nausea And Vomiting    DISCHARGE MEDICATIONS:   Current Discharge Medication List    START taking these medications   Details  doxycycline (VIBRA-TABS) 100 MG tablet Take 1 tablet (100 mg total) by mouth every 12 (twelve) hours. Qty: 14 tablet, Refills: 0      CONTINUE these medications which have CHANGED   Details  potassium chloride SA (K-DUR,KLOR-CON) 20 MEQ tablet Take 1 tablet (20 mEq total) by mouth See admin instructions. Take 2 tablets (40 mEq) by mouth every morning,  Take 20 mEq by mouth every afternoon, and Take 2 tablets (40 mEq) by mouth every night at bedtime.      CONTINUE these medications which have NOT CHANGED   Details  albuterol (PROVENTIL HFA;VENTOLIN HFA) 108 (90 BASE) MCG/ACT inhaler Inhale 2 puffs into the lungs 2 (two) times daily as needed for wheezing or shortness of breath.     albuterol (PROVENTIL) (2.5 MG/3ML) 0.083% nebulizer solution Take 3 mLs (2.5 mg total) by nebulization every 6 (six) hours as needed for wheezing or shortness of  breath. Qty: 75 mL, Refills: 0    aspirin EC 325 MG tablet Take 325 mg by mouth at bedtime.     budesonide (PULMICORT) 180 MCG/ACT inhaler Inhale 2 puffs into the lungs 2 (two) times daily.    Calcium Carbonate-Vit D-Min (CALCIUM 1200) 1200-1000 MG-UNIT CHEW Chew 1 tablet by mouth daily.    cholecalciferol (VITAMIN D) 1000 units tablet Take 1,000 Units by mouth every morning.     clopidogrel (PLAVIX) 75 MG tablet Take 75 mg by mouth daily.    diltiazem (CARDIZEM CD) 180 MG 24 hr capsule Take 180 mg by mouth every morning.     Esomeprazole Magnesium (NEXIUM PO) Take 22.3 mg by mouth 2 (two) times daily.     furosemide (LASIX) 20 MG tablet Take 20 mg by mouth 2 (two) times daily.    Melatonin 5 MG TABS Take 5 mg by mouth at bedtime.     montelukast (SINGULAIR) 10 MG tablet Take 10 mg by mouth every morning.     oxyCODONE (ROXICODONE) 5 MG immediate release tablet Take 1 tablet (5 mg total) by mouth every 4 (four) hours as needed for severe pain. Qty: 30 tablet, Refills: 0    pramipexole (MIRAPEX) 0.5 MG tablet Take 0.5 mg by mouth 2 (two) times daily.    simvastatin (ZOCOR) 20 MG tablet Take 20 mg by mouth at bedtime.    gabapentin (NEURONTIN) 300 MG capsule TAKE ONE CAPSULE BY MOUTH TWICE A DAY Qty: 60 capsule, Refills: 5    temazepam (RESTORIL) 15 MG capsule Take 15 mg by mouth at bedtime as needed for sleep.      STOP taking these medications     amoxicillin-clavulanate (AUGMENTIN) 875-125 MG tablet      docusate sodium (COLACE) 100 MG capsule      ferrous sulfate 325 (65 FE) MG tablet      polyethylene glycol (MIRALAX / GLYCOLAX) packet          DISCHARGE INSTRUCTIONS:   Follow-up PMD one week  If you experience worsening of your admission symptoms, develop shortness of breath, life threatening emergency, suicidal or homicidal thoughts you must seek medical attention immediately by calling 911 or calling your MD immediately  if symptoms less severe.  You Must  read complete instructions/literature along with all the possible adverse reactions/side effects for all the Medicines you take and that have been prescribed to you. Take any new Medicines after you have completely understood and accept all the possible adverse reactions/side effects.   Please note  You were cared for by a hospitalist during your hospital stay. If you have any questions about your discharge medications or the care you received while you were in the hospital after you are discharged, you can call the unit and asked to speak with the hospitalist on call if the hospitalist that took care of you is not available. Once you are discharged, your primary care physician will handle any further medical  issues. Please note that NO REFILLS for any discharge medications will be authorized once you are discharged, as it is imperative that you return to your primary care physician (or establish a relationship with a primary care physician if you do not have one) for your aftercare needs so that they can reassess your need for medications and monitor your lab values.    Today   CHIEF COMPLAINT:   Chief Complaint  Patient presents with  . Foot Pain    HISTORY OF PRESENT ILLNESS:  Natasha [name]  is a 83 y.o. female presented with foot pain and found to have a cellulitis of the right lower extremity. Also found to have acute hypoxemia with a pulse ox of 79% and a right upper lobe pneumonia   VITAL SIGNS:  Blood pressure (!) 143/60, pulse 86, temperature 98.6 F (37 C), temperature source Oral, resp. rate 18, height 4\' 11"  (1.499 m), weight 64.3 kg (141 lb 12.8 oz), SpO2 94 %.    PHYSICAL EXAMINATION:  GENERAL:  83 y.o.-year-old patient lying in the bed with no acute distress.  EYES: Pupils equal, round, reactive to light and accommodation. No scleral icterus. Extraocular muscles intact.  HEENT: Head atraumatic, normocephalic. Oropharynx and nasopharynx clear.  NECK:  Supple, no  jugular venous distention. No thyroid enlargement, no tenderness.  LUNGS: Normal breath sounds bilaterally, no wheezing, rales,rhonchi or crepitation. No use of accessory muscles of respiration.  CARDIOVASCULAR: S1, S2 normal. No murmurs, rubs, or gallops.  ABDOMEN: Soft, non-tender, non-distended. Bowel sounds present. No organomegaly or mass.  EXTREMITIES: No pedal edema, cyanosis, or clubbing. Slight pain to palpation right ankle laterally NEUROLOGIC: Cranial nerves II through XII are intact. Muscle strength 5/5 in all extremities. Sensation intact. Gait not checked.  PSYCHIATRIC: The patient is alert and oriented x 3.  SKIN: IV infiltrated left arm. Appeared better in the afternoon. Swelling is lessened a little bit. Redness has lessened a little bit.  DATA REVIEW:   CBC  Recent Labs Lab 03/28/16 0324  WBC 6.9  HGB 10.1*  HCT 30.4*  PLT 258    Chemistries   Recent Labs Lab 03/28/16 0324  03/31/16 0448  NA 137  < > 138  K 3.4*  < > 3.7  CL 96*  < > 100*  CO2 34*  < > 32  GLUCOSE 97  < > 92  BUN 20  < > 15  CREATININE 0.96  < > 0.75  CALCIUM 8.6*  < > 9.0  MG  --   < > 1.9  AST 18  --   --   ALT 8*  --   --   ALKPHOS 77  --   --   BILITOT 0.5  --   --   < > = values in this interval not displayed.  Cardiac Enzymes No results for input(s): TROPONINI in the last 168 hours.  Microbiology Results  Results for orders placed or performed during the hospital encounter of 03/27/16  Culture, blood (Routine x 2)     Status: None (Preliminary result)   Collection Time: 03/27/16 11:48 AM  Result Value Ref Range Status   Specimen Description BLOOD RIGHT HAND  Final   Special Requests BOTTLES DRAWN AEROBIC AND ANAEROBIC BCAV  Final   Culture NO GROWTH 4 DAYS  Final   Report Status PENDING  Incomplete  Culture, blood (Routine x 2)     Status: None (Preliminary result)   Collection Time: 03/27/16 11:49 AM  Result Value Ref  Range Status   Specimen Description BLOOD RIGHT  ASSIST CONTROL  Final   Special Requests BOTTLES DRAWN AEROBIC AND ANAEROBIC BCAV  Final   Culture NO GROWTH 4 DAYS  Final   Report Status PENDING  Incomplete  Gastrointestinal Panel by PCR , Stool     Status: None   Collection Time: 03/28/16  8:45 AM  Result Value Ref Range Status   Campylobacter species NOT DETECTED NOT DETECTED Final   Plesimonas shigelloides NOT DETECTED NOT DETECTED Final   Salmonella species NOT DETECTED NOT DETECTED Final   Yersinia enterocolitica NOT DETECTED NOT DETECTED Final   Vibrio species NOT DETECTED NOT DETECTED Final   Vibrio cholerae NOT DETECTED NOT DETECTED Final   Enteroaggregative E coli (EAEC) NOT DETECTED NOT DETECTED Final   Enteropathogenic E coli (EPEC) NOT DETECTED NOT DETECTED Final   Enterotoxigenic E coli (ETEC) NOT DETECTED NOT DETECTED Final   Shiga like toxin producing E coli (STEC) NOT DETECTED NOT DETECTED Final   Shigella/Enteroinvasive E coli (EIEC) NOT DETECTED NOT DETECTED Final   Cryptosporidium NOT DETECTED NOT DETECTED Final   Cyclospora cayetanensis NOT DETECTED NOT DETECTED Final   Entamoeba histolytica NOT DETECTED NOT DETECTED Final   Giardia lamblia NOT DETECTED NOT DETECTED Final   Adenovirus F40/41 NOT DETECTED NOT DETECTED Final   Astrovirus NOT DETECTED NOT DETECTED Final   Norovirus GI/GII NOT DETECTED NOT DETECTED Final   Rotavirus A NOT DETECTED NOT DETECTED Final   Sapovirus (I, II, IV, and V) NOT DETECTED NOT DETECTED Final  C difficile quick scan w PCR reflex     Status: None   Collection Time: 03/28/16  8:45 AM  Result Value Ref Range Status   C Diff antigen NEGATIVE NEGATIVE Final   C Diff toxin NEGATIVE NEGATIVE Final   C Diff interpretation No C. difficile detected.  Final     Management plans discussed with the patient, family and they are in agreement.  CODE STATUS:     Code Status Orders        Start     Ordered   03/27/16 1541  Full code  Continuous     03/27/16 1540    Code Status  History    Date Active Date Inactive Code Status Order ID Comments User Context   01/30/2016  6:06 PM 02/02/2016  3:20 PM Full Code [id number]  Henreitta Leber, MD Inpatient   08/24/2015  9:16 PM 08/25/2015 10:24 AM Full Code [id number]  Gladstone Lighter, MD Inpatient   06/27/2015  4:42 PM 06/30/2015  4:16 PM Partial Code [id number]  Bettey Costa, MD Inpatient   06/07/2015  8:08 PM 06/11/2015  7:26 PM Full Code [id number]  Fritzi Mandes, MD Inpatient   05/22/2015  1:52 PM 05/24/2015  4:12 PM Full Code [id number]  Algernon Huxley, MD Inpatient   03/11/2015  6:20 PM 03/16/2015  2:15 PM Full Code [id number]  Fritzi Mandes, MD Inpatient   02/27/2015  3:53 PM 03/03/2015  6:24 PM Full Code [id number]  Sela Hua, PA-C Inpatient    Advance Directive Documentation     Most Recent Value  Type of Advance Directive  Healthcare Power of [place], Living will  Pre-existing out of facility DNR order (yellow form or pink MOST form)  -  "MOST" Form in Place?  -      TOTAL TIME TAKING CARE OF THIS PATIENT: 35 minutes.    Loletha Grayer M.D on 03/31/2016 at 4:31 PM  Between 7am to 6pm - Pager -  [phone]  After 6pm go to [email or web address] - password [email or web address]  [facility] Office  [phone]  CC: Primary care physician; Rusty Aus, MD

## 2016-03-31 NOTE — Progress Notes (Signed)
DISCHARGE NOTE:  Pt given discharge instructions and prescriptions. Pt verbalized understanding. Pt wheeled to car by staff.

## 2016-03-31 NOTE — Care Management Note (Signed)
Case Management Note  Patient Details  Name: Natasha [name] MRN: [id number] Date of Birth: Feb 23, 1928  Subjective/Objective:  Discharging today                  Action/Plan: [facility] notified of resumption of care.   Expected Discharge Date:  03/31/16               Expected Discharge Plan:  [place]  In-House Referral:     Discharge planning Services  CM Consult  Post Acute Care Choice:  Home Health, Resumption of Svcs/PTA Provider Choice offered to:     DME Arranged:    DME Agency:     HH Arranged:  RN, PT, Nurse's Aide [place] Agency:  [facility] (now [facility])  Status of Service:  In process, will continue to follow  If discussed at Long Length of Stay Meetings, dates discussed:    Additional Comments:  Jolly Mango, RN 03/31/2016, 3:44 PM

## 2016-04-01 LAB — CULTURE, BLOOD (ROUTINE X 2)
CULTURE: NO GROWTH
Culture: NO GROWTH

## 2016-04-05 ENCOUNTER — Encounter: Payer: Medicare Other | Admitting: Surgery

## 2016-04-05 DIAGNOSIS — Z87891 Personal history of nicotine dependence: Secondary | ICD-10-CM | POA: Diagnosis not present

## 2016-04-05 DIAGNOSIS — I10 Essential (primary) hypertension: Secondary | ICD-10-CM | POA: Diagnosis not present

## 2016-04-05 DIAGNOSIS — L97122 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 exposed: Secondary | ICD-10-CM | POA: Diagnosis not present

## 2016-04-05 DIAGNOSIS — J449 Chronic obstructive pulmonary disease, unspecified: Secondary | ICD-10-CM | POA: Diagnosis not present

## 2016-04-05 DIAGNOSIS — D649 Anemia, unspecified: Secondary | ICD-10-CM | POA: Diagnosis not present

## 2016-04-05 DIAGNOSIS — I70242 Atherosclerosis 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ration of calf: Secondary | ICD-10-CM | POA: Diagnosis present

## 2016-04-05 DIAGNOSIS — K219 Gastro-esophageal reflux disease without esophagitis: Secondary | ICD-10-CM | POA: Diagnosis not present

## 2016-04-05 DIAGNOSIS — G500 Trigeminal neuralgia: Secondary | ICD-10-CM | POA: Diagnosis not present

## 2016-04-05 DIAGNOSIS — M1990 Unspecified osteoarthritis, unspecified site: Secondary | ICD-10-CM | POA: Diagnosis not present

## 2016-04-05 DIAGNOSIS — L97222 Non-pressure chronic ulcer of left calf with fat layer exposed: Secondary | ICD-10-CM | POA: Diagnosis not present

## 2016-04-05 DIAGNOSIS — G2581 Restless legs syndrome: Secondary | ICD-10-CM | POA: Diagnosis not present

## 2016-04-05 DIAGNOSIS — I890 Lymphedema, not elsewhere classified: Secondary | ICD-10-CM | POA: Diagnosis not present

## 2016-04-05 DIAGNOSIS — Z7982 Long term (current) use of aspirin: Secondary | ICD-10-CM | POA: Diagnosis not present

## 2016-04-05 DIAGNOSIS — I2510 Atherosclerotic heart disease of native coronary artery without angina pectoris: Secondary | ICD-10-CM | POA: Diagnosis not present

## 2016-04-05 DIAGNOSIS — E1140 Type 2 diabetes mellitus with diabetic neuropathy, unspecified: Secondary | ICD-10-CM | POA: Diagnosis not present

## 2016-04-05 DIAGNOSIS — Z79899 Other long term (current) drug therapy: Secondary | ICD-10-CM | POA: Diagnosis not present

## 2016-04-06 NOTE — Progress Notes (Signed)
GRATIA, DISLA ([id number]) Visit Report for 04/05/2016 Chief Complaint Document Details Patient Name: Natasha [name], Natasha [name] Date of Service: 04/05/2016 1:30 PM Medical Record Number: [id number] Patient Account Number: [id number] Date of Birth/Sex: [birth year] (83 y.o. Female) Treating RN: Montey Hora Primary Care Provider: Emily Filbert Other Clinician: Referring Provider: Emily Filbert Treating Provider/Extender: Frann Rider in Treatment: 11 Information Obtained from: Patient Chief Complaint Patient presents to the wound care center today with an open arterial ulcer along with diabetes mellitus to the left lower extremity which she's had for over a month Electronic Signature(s) Signed: 04/05/2016 2:02:37 PM By: Christin Fudge MD, FACS Entered By: Christin Fudge on 04/05/2016 14:02:37 Natasha [name] ([id number]) -------------------------------------------------------------------------------- Debridement Details Patient Name: Natasha [name] Date of Service: 04/05/2016 1:30 PM Medical Record Number: [id number] Patient Account Number: [id number] Date of Birth/Sex: 1928/12/25 (83 y.o. Female) Treating RN: Montey Hora Primary Care Provider: Emily Filbert Other Clinician: Referring Provider: Emily Filbert Treating Provider/Extender: Frann Rider in Treatment: 11 Debridement Performed for Wound #3 Left,Distal Lower Leg Assessment: Performed By: Physician Christin Fudge, MD Debridement: Debridement Pre-procedure Yes - 13:54 Verification/Time Out Taken: Start Time: 13:54 Pain Control: Lidocaine 4% Topical Solution Level: Skin/Subcutaneous Tissue Total Area Debrided (L x 1.7 (cm) x 0.5 (cm) = 0.85 (cm) W): Tissue and other Viable, Non-Viable, Exudate, Fibrin/Slough, Subcutaneous material debrided: Instrument: Curette Bleeding: Minimum Hemostasis Achieved: Pressure End Time: 13:56 Procedural Pain: 0 Post Procedural Pain: 0 Response to Treatment:  Procedure was tolerated well Post Debridement Measurements of Total Wound Length: (cm) 1.7 Width: (cm) 0.5 Depth: (cm) 0.2 Volume: (cm) 0.134 Character of Wound/Ulcer Post Improved Debridement: Severity of Tissue Post Debridement: Fat layer exposed Post Procedure Diagnosis Same as Pre-procedure Electronic Signature(s) Signed: 04/05/2016 2:02:28 PM By: Christin Fudge MD, FACS Signed: 04/05/2016 4:57:21 PM By: Montey Hora Entered By: Christin Fudge on 04/05/2016 14:02:28 Natasha [name] ([id number], MELLOTT ([id number]) -------------------------------------------------------------------------------- HPI Details Patient Name: Natasha [name] Date of Service: 04/05/2016 1:30 PM Medical Record Number: [id number] Patient Account Number: [id number] Date of Birth/Sex: 04-Mar-1928 (83 y.o. Female) Treating RN: Montey Hora Primary Care Provider: Emily Filbert Other Clinician: Referring Provider: Emily Filbert Treating Provider/Extender: Frann Rider in Treatment: 11 History of Present Illness Location: left thigh and left calf in the area where there was previous vascular surgery Quality: Patient reports experiencing a dull pain to affected area(s). Severity: Patient states wound (s) are getting better. Duration: Patient has had the wound for > 2 months prior to seeking treatment at the wound center Timing: Pain in wound is Intermittent (comes and goes Context: The wound occurred when the patient had vascular surgery and the wounds were not healing very well Modifying Factors: Other treatment(s) tried include:local care and oral antibiotics Associated Signs and Symptoms: Patient reports having increase swelling. HPI Description: 83 year old female with significant past medical history of anemia, cellulitis and abscess of the leg, COPD, coronary artery disease, hypertension, osteoarthritis, peripheral vascular disease with claudication, trigeminal neuralgia  and type 2 diabetes mellitus without complications. A past surgical history significant for appendectomy, left femoral o peroneal bypass graft in October 2017, and debridement of skin and subcutis tissue on the left side in December 2017. She was treated by Dr. Lucky Cowboy earlier, with multiple left leg revascularization both endovascular and open. She is a former smoker who quit 30 years ago. When she was last seen at [facility], at the vascular surgery clinic she had a left lower extremity 3 separate wounds along the saphenectomy site. She was  placed on Bactrim DS for 10 days. She had a good signal over the peroneal artery and the bypass remains patent. Prior to surgery her left ABI was 0.46 and a right was 0.98. Note the patient was seen by Dr. Amalia Hailey of podiatry on 01/15/2016 and he saw for ulceration of her left great toe. Most recently the patient was seen by Dr. Maura Crandall, the vascular surgeon on January 5 and these notes are reviewed on Epic-- he noted that the 3 wounds on the left lower extremity are granulation very slowly, and he recommended packing with wet-to-dry Kerlix. The surgeon noted excellent signal over the peroneal artery and the recommendation was to see as at the wound center to help with healing. 02/12/2016 -- we have not seen her for 2 weeks and her husband tells me that she was admitted to the hospital with a pneumonia. I have reviewed a hospital notes, she was there between January 19 and January 22, with clinical sepsis and bilateral pneumonia, fever and leukocytosis and was treated with vancomycin and Zosyn. Vancomycin was then stopped and Zithromax was added. He was discharged home on Augmentin 02/23/2016 -- she had a arterial duplex study done recently at [facility] and her right ABI was 0.92 and the left ABI was 0.96. The femoral to peroneal bypass is widely patent. she was also seen by the surgeon who noted a postoperative seroma in the left groin. she was asked to  return in 3 months for duplex examination 03/01/16 -she has got a lot of lymphedema for left lower extremity and there is weeping from the lower lateral leg. She had been walking around a lot yesterday as she had gone shopping. LENNI, RECKNER ([id number]) 03/08/2016 -- and did used to have a lot of lymphedema for left lower extremity but does not have much pain. 04/05/2016 -- he was admitted last week between March 17 to March 21 with acute respiratory failure, community-acquired pneumonia, cellulitis of the right leg.right upper lobe pneumonia was seen on CT scan. Was treated with vancomycin and Zosyn at the time of admission and switched over to doxycycline upon discharge home. survey of the right foot showed no fracture or dislocation and no soft tissue abnormality. Electronic Signature(s) Signed: 04/05/2016 2:06:31 PM By: Christin Fudge MD, FACS Entered By: Christin Fudge on 04/05/2016 14:06:30 Natasha [name] ([id number]) -------------------------------------------------------------------------------- Physical Exam Details Patient Name: Natasha [name] Date of Service: 04/05/2016 1:30 PM Medical Record Number: [id number] Patient Account Number: [id number] Date of Birth/Sex: 1928-06-04 (83 y.o. Female) Treating RN: Montey Hora Primary Care Provider: Emily Filbert Other Clinician: Referring Provider: Emily Filbert Treating Provider/Extender: Frann Rider in Treatment: 11 Constitutional . Pulse regular. Respirations normal and unlabored. Afebrile. . Eyes Nonicteric. Reactive to light. Ears, Nose, Mouth, and Throat Lips, teeth, and gums WNL.Marland Kitchen Moist mucosa without lesions. Neck supple and nontender. No palpable supraclavicular or cervical adenopathy. Normal sized without goiter. Respiratory WNL. No retractions.. Cardiovascular Pedal Pulses WNL. No clubbing, cyanosis or edema. Lymphatic No adneopathy. No adenopathy. No adenopathy. Musculoskeletal Adexa  without tenderness or enlargement.. Digits and nails w/o clubbing, cyanosis, infection, petechiae, ischemia, or inflammatory conditions.. Integumentary (Hair, Skin) No suspicious lesions. No crepitus or fluctuance. No peri-wound warmth or erythema. No masses.Marland Kitchen Psychiatric Judgement and insight Intact.. No evidence of depression, anxiety, or agitation.. Notes the wound on the left thigh is looking very good and there is minimal 1 cm sinus tract to the low part of the wound. The wound on the left calf were  sharply debrided and there is healthy granulation tissue under the debris. Electronic Signature(s) Signed: 04/05/2016 2:07:18 PM By: Christin Fudge MD, FACS Entered By: Christin Fudge on 04/05/2016 14:07:17 Natasha [name] ([id number]) -------------------------------------------------------------------------------- Physician Orders Details Patient Name: Natasha [name] Date of Service: 04/05/2016 1:30 PM Medical Record Number: [id number] Patient Account Number: [id number] Date of Birth/Sex: 1928/07/17 (83 y.o. Female) Treating RN: Montey Hora Primary Care Provider: Emily Filbert Other Clinician: Referring Provider: Emily Filbert Treating Provider/Extender: Frann Rider in Treatment: 11 Verbal / Phone Orders: No Diagnosis Coding Wound Cleansing Wound #1 Left,Medial Upper Leg o Clean wound with Normal Saline. o Cleanse wound with mild soap and water Wound #3 Left,Distal Lower Leg o Clean wound with Normal Saline. o Cleanse wound with mild soap and water Anesthetic Wound #1 Left,Medial Upper Leg o Topical Lidocaine 4% cream applied to wound bed prior to debridement - for clinic use Wound #3 Left,Distal Lower Leg o Topical Lidocaine 4% cream applied to wound bed prior to debridement - for clinic use Skin Barriers/Peri-Wound Care Wound #1 Left,Medial Upper Leg o Skin Prep Wound #3 Left,Distal Lower Leg o Skin Prep Primary Wound Dressing Wound #1  Left,Medial Upper Leg o Iodoform packing Gauze Wound #3 Left,Distal Lower Leg o Prisma Ag Secondary Dressing Wound #1 Left,Medial Upper Leg o Dry Gauze o Boardered Foam Dressing Wound #3 Left,Distal Lower Leg MARIJANE, TROWER. ([id number]) o ABD and Kerlix/Conform Dressing Change Frequency Wound #1 Left,Medial Upper Leg o Change dressing every other day. Wound #3 Left,Distal Lower Leg o Change dressing every other day. Follow-up Appointments Wound #1 Left,Medial Upper Leg o Return Appointment in 1 week. Wound #3 Left,Distal Lower Leg o Return Appointment in 1 week. Edema Control Wound #1 Left,Medial Upper Leg o Elevate legs to the level of the heart and pump ankles as often as possible Wound #3 Left,Distal Lower Leg o Elevate legs to the level of the heart and pump ankles as often as possible Additional Orders / Instructions Wound #1 Left,Medial Upper Leg o Increase protein intake. Wound #3 Left,Distal Lower Leg o Increase protein intake. Home Health Wound #1 Left,Medial Upper Leg o Continue Home Health Visits o Home Health Nurse may visit PRN to address patientos wound care needs. o FACE TO FACE ENCOUNTER: MEDICARE and MEDICAID PATIENTS: I certify that this patient is under my care and that I had a face-to-face encounter that meets the physician face-to-face encounter requirements with this patient on this date. The encounter with the patient was in whole or in part for the following MEDICAL CONDITION: (primary reason for [facility]) MEDICAL NECESSITY: I certify, that based on my findings, NURSING services are a medically necessary home health service. HOME BOUND STATUS: I certify that my clinical findings support that this patient is homebound (i.e., Due to illness or injury, pt requires aid of supportive devices such as crutches, cane, wheelchairs, walkers, the use of special transportation or the assistance of another person to  leave their place of residence. There is a normal inability to leave the home and doing so requires considerable and taxing effort. Other absences are for medical reasons / religious services and are infrequent or of short duration when for other reasons). DAVYN, ELSASSER ([id number]) o If current dressing causes regression in wound condition, may D/C ordered dressing product/s and apply Normal Saline Moist Dressing daily until next [place] / Other MD appointment. [place] of regression in wound condition at [phone]. o Please direct any NON-WOUND related issues/requests  for orders to patient's Primary Care Physician Wound #3 Left,Distal Lower Leg o [place] Nurse may visit PRN to address patientos wound care needs. o FACE TO FACE ENCOUNTER: MEDICARE and MEDICAID PATIENTS: I certify that this patient is under my care and that I had a face-to-face encounter that meets the physician face-to-face encounter requirements with this patient on this date. The encounter with the patient was in whole or in part for the following MEDICAL CONDITION: (primary reason for [place]) MEDICAL NECESSITY: I certify, that based on my findings, NURSING services are a medically necessary home health service. HOME BOUND STATUS: I certify that my clinical findings support that this patient is homebound (i.e., Due to illness or injury, pt requires aid of supportive devices such as crutches, cane, wheelchairs, walkers, the use of special transportation or the assistance of another person to leave their place of residence. There is a normal inability to leave the home and doing so requires considerable and taxing effort. Other absences are for medical reasons / religious services and are infrequent or of short duration when for other reasons). o If current dressing causes regression in wound condition, may D/C ordered dressing  product/s and apply Normal Saline Moist Dressing daily until next [place] / Other MD appointment. [place] of regression in wound condition at [phone]. o Please direct any NON-WOUND related issues/requests for orders to patient's Primary Care Physician Electronic Signature(s) Signed: 04/05/2016 4:09:48 PM By: Christin Fudge MD, FACS Signed: 04/05/2016 4:57:21 PM By: Montey Hora Entered By: Montey Hora on 04/05/2016 13:58:26 Natasha [name] ([id number]) -------------------------------------------------------------------------------- Problem List Details Patient Name: Natasha [name] Date of Service: 04/05/2016 1:30 PM Medical Record Number: [id number] Patient Account Number: [id number] Date of Birth/Sex: 17-Dec-1928 (83 y.o. 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 Female) Treating RN: Montey Hora Primary Care Provider:  Emily Filbert Other Clinician: Referring Provider: Emily Filbert Treating Provider/Extender: Frann Rider in Treatment: 11 Subjective Chief Complaint Information obtained from Patient Patient presents to the wound care center today with an open arterial ulcer along with diabetes mellitus to the left lower extremity which she's had for over a month History of Present Illness (HPI) The following HPI elements were documented for the patient's wound: Location: left thigh and left calf in the area where there was previous vascular surgery Quality: Patient reports experiencing a dull pain to affected area(s). Severity: Patient states wound (s) are getting better. Duration: Patient has had the wound for > 2 months prior to seeking treatment at the wound center Timing: Pain in wound is Intermittent (comes and goes Context: The wound occurred when the patient had vascular surgery and the wounds were not healing very well Modifying Factors: Other  treatment(s) tried include:local care and oral antibiotics Associated Signs and Symptoms: Patient reports having increase swelling. 83 year old female with significant past medical history of anemia, cellulitis and abscess of the leg, COPD, coronary artery disease, hypertension, osteoarthritis, peripheral vascular disease with claudication, trigeminal neuralgia and type 2 diabetes mellitus without complications. A past surgical history significant for appendectomy, left femoral peroneal bypass graft in October 2017, and debridement of skin and subcutis tissue on the left side in December 2017. She was treated by Dr. Lucky Cowboy earlier, with multiple left leg revascularization both endovascular and open. She is a former smoker who quit 30 years ago. When she was last seen at [facility], at the vascular surgery clinic she had a left lower extremity 3 separate wounds along the saphenectomy site. She was placed on Bactrim DS for 10 days. She had a good signal over  the peroneal artery and the bypass remains patent. Prior to surgery her left ABI was 0.46 and a right was 0.98. Note the patient was seen by Dr. Amalia Hailey of podiatry on 01/15/2016 and he saw for ulceration of her left great toe. Most recently the patient was seen by Dr. Maura Crandall, the vascular surgeon on January 5 and these notes are reviewed on Epic-- he noted that the 3 wounds on the left lower extremity are granulation very slowly, and he recommended packing with wet-to-dry Kerlix. The surgeon noted excellent signal over the peroneal artery and the recommendation was to see as at the wound center to help with healing. 02/12/2016 -- we have not seen her for 2 weeks and her husband tells me that she was admitted to the hospital with a pneumonia. I have reviewed a hospital notes, she was there between January 19 and January 22, with clinical sepsis and bilateral pneumonia, fever and leukocytosis and was treated with KRIMSON, MASSMANN. ([id number]) vancomycin and Zosyn. Vancomycin was then stopped and Zithromax was added. He was discharged home on Augmentin 02/23/2016 -- she had a arterial duplex study done recently at [facility] and her right ABI was 0.92 and the left ABI was 0.96. The femoral to peroneal bypass is widely patent. she was also seen by the surgeon who noted a postoperative seroma in the left groin. she was asked to return in 3 months for duplex examination 03/01/16 -she has got a lot of lymphedema for left lower extremity and there is weeping from the lower lateral leg. She had been walking around a lot yesterday as she had gone shopping. 03/08/2016 -- and did used to have a lot of lymphedema for left lower extremity but does not have much pain. 04/05/2016 -- he was admitted last week between March 17 to March 21 with acute respiratory failure, community-acquired pneumonia, cellulitis of the right leg.right upper lobe pneumonia was seen on CT scan. Was treated with  vancomycin and Zosyn at the time of admission and switched over to doxycycline upon discharge home. survey of the right foot showed no fracture or dislocation and no soft tissue abnormality. Objective Constitutional Pulse regular. Respirations normal and unlabored. Afebrile. Vitals Time Taken: 1:43 PM, Height: 59 in, Weight: 140 lbs, BMI: 28.3, Temperature: 98.2 F, Pulse: 73 bpm, Respiratory Rate: 18 breaths/min, Blood Pressure: 138/52 mmHg. Eyes Nonicteric. Reactive to light. Ears, Nose, Mouth, and Throat Lips, teeth, and gums WNL.Marland Kitchen Moist mucosa without lesions. Neck supple and nontender. No palpable supraclavicular or cervical adenopathy. Normal sized without goiter. Respiratory WNL. No retractions.. Cardiovascular Pedal Pulses WNL. No clubbing, cyanosis or edema. Lymphatic No adneopathy.  No adenopathy. No adenopathy. ALISEA, MATTE ([id number]) Musculoskeletal Adexa without tenderness or enlargement.. Digits and nails w/o clubbing, cyanosis, infection, petechiae, ischemia, or inflammatory conditions.Marland Kitchen Psychiatric Judgement and insight Intact.. No evidence of depression, anxiety, or agitation.. General Notes: the wound on the left thigh is looking very good and there is minimal 1 cm sinus tract to the low part of the wound. The wound on the left calf were sharply debrided and there is healthy granulation tissue under the debris. Integumentary (Hair, Skin) No suspicious lesions. No crepitus or fluctuance. No peri-wound warmth or erythema. No masses.. Wound #1 status is Open. Original cause of wound was Surgical Injury. The wound is located on the Left,Medial Upper Leg. The wound measures 0.4cm length x 0.3cm width x 0.9cm depth; 0.094cm^2 area and 0.085cm^3 volume. There is Fat Layer (Subcutaneous Tissue) Exposed exposed. There is no tunneling or undermining noted. There is a medium amount of serosanguineous drainage noted. The wound margin is epibole. There is large  (67-100%) red, pink granulation within the wound bed. There is a small (1-33%) amount of necrotic tissue within the wound bed including Adherent Slough. The periwound skin appearance exhibited: Scarring. The periwound skin appearance did not exhibit: Erythema. Periwound temperature was noted as No Abnormality. The periwound has tenderness on palpation. Wound #3 status is Open. Original cause of wound was Surgical Injury. The wound is located on the Left,Distal Lower Leg. The wound measures 1.7cm length x 0.5cm width x 0.2cm depth; 0.668cm^2 area and 0.134cm^3 volume. There is Fat Layer (Subcutaneous Tissue) Exposed exposed. There is no tunneling or undermining noted. There is a large amount of serosanguineous drainage noted. The wound margin is epibole. There is medium (34-66%) red, pink granulation within the wound bed. There is a medium (34- 66%) amount of necrotic tissue within the wound bed including Adherent Slough. The periwound skin appearance exhibited: Scarring. The periwound skin appearance did not exhibit: Callus, Crepitus, Excoriation, Induration, Rash, Dry/Scaly, Maceration, Atrophie Blanche, Cyanosis, Ecchymosis, Hemosiderin Staining, Mottled, Pallor, Rubor, Erythema. Periwound temperature was noted as No Abnormality. The periwound has tenderness on palpation. Assessment Active Problems ICD-10 I70.242 - Atherosclerosis 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ration of calf L97.222 - Non-pressure chronic ulcer of left calf with fat layer exposed L97.122 - 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 exposed I89.0 - Lymphedema, not elsewhere classified RAZAN, SILER ([id number]) Procedures Wound #3 Wound #3 is an Open Surgical Wound located on the Left,Distal Lower Leg . There was a Skin/Subcutaneous Tissue Debridement ([id number]) debridement with total area of 0.85 sq cm performed by Christin Fudge, MD. with the following instrument(s): Curette to remove  Viable and Non-Viable tissue/material including Exudate, Fibrin/Slough, and Subcutaneous after achieving pain control using Lidocaine 4% Topical Solution. A time out was conducted at 13:54, prior to the start of the procedure. A Minimum amount of bleeding was controlled with Pressure. The procedure was tolerated well with a pain level of 0 throughout and a pain level of 0 following the procedure. Post Debridement Measurements: 1.7cm length x 0.5cm width x 0.2cm depth; 0.134cm^3 volume. Character of Wound/Ulcer Post Debridement is improved. Severity of Tissue Post Debridement is: Fat layer exposed. Post procedure Diagnosis Wound #3: Same as Pre-Procedure Plan Wound Cleansing: Wound #1 Left,Medial Upper Leg: Clean wound with Normal Saline. Cleanse wound with mild soap and water Wound #3 Left,Distal Lower Leg: Clean wound with Normal Saline. Cleanse wound with mild soap and water Anesthetic: Wound #1 Left,Medial Upper Leg: Topical Lidocaine 4% cream  applied to wound bed prior to debridement - for clinic use Wound #3 Left,Distal Lower Leg: Topical Lidocaine 4% cream applied to wound bed prior to debridement - for clinic use Skin Barriers/Peri-Wound Care: Wound #1 Left,Medial Upper Leg: Skin Prep Wound #3 Left,Distal Lower Leg: Skin Prep Primary Wound Dressing: Wound #1 Left,Medial Upper Leg: Iodoform packing Gauze Wound #3 Left,Distal Lower Leg: Prisma Ag Secondary Dressing: Wound #1 Left,Medial Upper Leg: Dry Gauze BRENAE, LASECKI. ([id number]) Boardered Foam Dressing Wound #3 Left,Distal Lower Leg: ABD and Kerlix/Conform Dressing Change Frequency: Wound #1 Left,Medial Upper Leg: Change dressing every other day. Wound #3 Left,Distal Lower Leg: Change dressing every other day. Follow-up Appointments: Wound #1 Left,Medial Upper Leg: Return Appointment in 1 week. Wound #3 Left,Distal Lower Leg: Return Appointment in 1 week. Edema Control: Wound #1 Left,Medial Upper  Leg: Elevate legs to the level of the heart and pump ankles as often as possible Wound #3 Left,Distal Lower Leg: Elevate legs to the level of the heart and pump ankles as often as possible Additional Orders / Instructions: Wound #1 Left,Medial Upper Leg: Increase protein intake. Wound #3 Left,Distal Lower Leg: Increase protein intake. Home Health: Wound #1 Left,Medial Upper Leg: [facility] Nurse may visit PRN to address patient s wound care needs. FACE TO FACE ENCOUNTER: MEDICARE and MEDICAID PATIENTS: I certify that this patient is under my care and that I had a face-to-face encounter that meets the physician face-to-face encounter requirements with this patient on this date. The encounter with the patient was in whole or in part for the following MEDICAL CONDITION: (primary reason for [place]) MEDICAL NECESSITY: I certify, that based on my findings, NURSING services are a medically necessary home health service. HOME BOUND STATUS: I certify that my clinical findings support that this patient is homebound (i.e., Due to illness or injury, pt requires aid of supportive devices such as crutches, cane, wheelchairs, walkers, the use of special transportation or the assistance of another person to leave their place of residence. There is a normal inability to leave the home and doing so requires considerable and taxing effort. Other absences are for medical reasons / religious services and are infrequent or of short duration when for other reasons). If current dressing causes regression in wound condition, may D/C ordered dressing product/s and apply Normal Saline Moist Dressing daily until next [place] / Other MD appointment. [place] of regression in wound condition at [phone]. Please direct any NON-WOUND related issues/requests for orders to patient's Primary Care Physician Wound #3 Left,Distal Lower Leg: [place] Nurse may visit PRN to address patient s wound care needs. FACE TO FACE ENCOUNTER: MEDICARE and MEDICAID PATIENTS: I certify that this patient is under my care and that I had a face-to-face encounter that meets the physician face-to-face encounter requirements with this patient on this date. The encounter with the patient was in whole or in part for the following MEDICAL CONDITION: (primary reason for [place]) MEDICAL NECESSITY: I certify, that based on my findings, NURSING services are a medically necessary home health service. HOME BOUND STATUS: I certify that my clinical findings support that this patient is homebound (i.e., Due to illness or injury, pt requires aid of supportive devices such as crutches, cane, wheelchairs, walkers, the use MARAE, COTTRELL. ([id number]) of special transportation or the assistance of another person to leave their place of residence. There is a normal inability to leave the home  and doing so requires considerable and taxing effort. Other absences are for medical reasons / religious services and are infrequent or of short duration when for other reasons). If current dressing causes regression in wound condition, may D/C ordered dressing product/s and apply Normal Saline Moist Dressing daily until next [place] / Other MD appointment. [place] of regression in wound condition at [phone]. Please direct any NON-WOUND related issues/requests for orders to patient's Primary Care Physician I have recommended: 1. packing of the thigh wound with 1/4 inch iodoform gauze and an appropriate foam border. 2. the wound lower down will be covered with Prisma ag and an appropriate bordered foam 3. elevation and exercise of both lower extremities 4. Good amount of protein intake, vitamin A, vitamin C and zinc 5. Regular visits to the wound center 6. to complete the doxycycline which she was placed  on in the hospital Electronic Signature(s) Signed: 04/05/2016 2:08:45 PM By: Christin Fudge MD, FACS Entered By: Christin Fudge on 04/05/2016 14:08:45 Natasha [name] ([id number]) -------------------------------------------------------------------------------- SuperBill Details Patient Name: Natasha [name] Date of Service: 04/05/2016 Medical Record Number: [id number] Patient Account Number: [id number] Date of Birth/Sex: 08/01/1928 (83 y.o. 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 exposed I89.0 Lymphedema, not elsewhere classified Facility Procedures CPT4 Code Description: [id number] 11042 - DEB SUBQ TISSUE 20 SQ CM/< ICD-10 Description Diagnosis I70.242 Atherosclerosis 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 L97.222 Non-pressure chronic ulcer of left calf with fat layer e Modifier: ration of ca xposed Quantity: 1 lf Physician Procedures CPT4 Code Description: [id number] 17408 - WC PHYS LEVEL 3 - EST PT ICD-10 Description Diagnosis I70.242 Atherosclerosis 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 L97.222 Non-pressure chronic ulcer of left calf with fat layer e L97.122 Non-pressure chronic  ulcer of left thigh with fat layer I89.0 Lymphedema, not elsewhere classified Modifier: 25 ration of ca xposed exposed Quantity: 1 lf CPT4 Code Description: [id number] 63149 - WC PHYS SUBQ TISS 20 SQ CM ICD-10 Description Diagnosis I70.242 Atherosclerosis 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 L97.222 Non-pressure chronic ulcer of left calf with fat layer e Modifier: ration  of ca xposed Quantity: 1 lf Electronic Signature(s) EMMALIN, JAQUESS ([id number]) Signed: 04/05/2016 2:09:05 PM By: Christin Fudge MD, FACS Entered By: Christin Fudge on 04/05/2016 14:09:04

## 2016-04-06 NOTE — Progress Notes (Signed)
ILHAM, ROUGHTON ([id number]) Visit Report for 04/05/2016 Arrival Information Details Patient Name: Natasha [name], Natasha [name] Date of Service: 04/05/2016 1:30 PM Medical Record Number: [id number] Patient Account Number: [id number] Date of Birth/Sex: May 02, 1928 (83 y.o. Female) Treating RN: Montey Hora Primary Care Ashna Dorough: Emily Filbert Other Clinician: Referring Tereza Gilham: Emily Filbert Treating Yudith Norlander/Extender: Frann Rider in Treatment: 11 Visit Information History Since Last Visit Added or deleted any medications: No Patient Arrived: Walker Any new allergies or adverse reactions: No Arrival Time: 13:37 Had a fall or experienced change in No Accompanied By: spouse activities of daily living that may affect Transfer Assistance: None risk of falls: Patient Identification Verified: Yes Signs or symptoms of abuse/neglect since last No Secondary Verification Process Yes visito Completed: Hospitalized since last visit: No Patient Requires Transmission- No Has Dressing in Place as Prescribed: Yes Based Precautions: Pain Present Now: No Patient Has Alerts: Yes Patient Alerts: Patient on Blood Thinner Plavix Electronic Signature(s) Signed: 04/05/2016 4:57:21 PM By: Montey Hora Entered By: Montey Hora on 04/05/2016 13:38:21 Natasha [name] ([id number]) -------------------------------------------------------------------------------- Encounter Discharge Information Details Patient Name: Natasha [name] Date of Service: 04/05/2016 1:30 PM Medical Record Number: [id number] Patient Account Number: [id number] Date of Birth/Sex: Aug 27, 1928 (83 y.o. Female) Treating RN: Montey Hora Primary Care Iyanni Hepp: Emily Filbert Other Clinician: Referring Keller Mikels: Emily Filbert Treating Thamas Appleyard/Extender: Frann Rider in Treatment: 11 Encounter Discharge Information Items Discharge Pain Level: 0 Discharge Condition: Stable Ambulatory Status: Walker Discharge  Destination: Home Transportation: Private Auto Accompanied By: spouse Schedule Follow-up Appointment: Yes Medication Reconciliation completed No and provided to Patient/Care Connie Lasater: Provided on Clinical Summary of Care: 04/05/2016 Form Type Recipient Paper Patient [facility] Electronic Signature(s) Signed: 04/05/2016 2:15:26 PM By: Ruthine Dose Entered By: Ruthine Dose on 04/05/2016 14:15:26 Natasha [name] ([id number]) -------------------------------------------------------------------------------- Lower Extremity Assessment Details Patient Name: Natasha [name] Date of Service: 04/05/2016 1:30 PM Medical Record Number: [id number] Patient Account Number: [id number] Date of Birth/Sex: [birth year] (83 y.o. Female) Treating RN: Montey Hora Primary Care Annlee Glandon: Emily Filbert Other Clinician: Referring Chamara Dyck: Emily Filbert Treating Roshaun Pound/Extender: Frann Rider in Treatment: 11 Vascular Assessment Pulses: Dorsalis Pedis Palpable: [Left:Yes] Posterior Tibial Extremity colors, hair growth, and conditions: Extremity Color: [Left:Hyperpigmented] Hair Growth on Extremity: [Left:No] Temperature of Extremity: [Left:Warm] Capillary Refill: [Left:< 3 seconds] Electronic Signature(s) Signed: 04/05/2016 4:57:21 PM By: Montey Hora Entered By: Montey Hora on 04/05/2016 13:55:24 Natasha [name] ([id number]) -------------------------------------------------------------------------------- Multi Wound Chart Details Patient Name: Natasha [name] Date of Service: 04/05/2016 1:30 PM Medical Record Number: [id number] Patient Account Number: [id number] Date of Birth/Sex: 07-Apr-1928 (83 y.o. Female) Treating RN: Montey Hora Primary Care Lalanya Rufener: Emily Filbert Other Clinician: Referring Akosua Constantine: Emily Filbert Treating Deijah Spikes/Extender: Frann Rider in Treatment: 11 Vital Signs Height(in): 59 Pulse(bpm): 73 Weight(lbs): 140 Blood  Pressure 138/52 (mmHg): Body Mass Index(BMI): 28 Temperature(F): 98.2 Respiratory Rate 18 (breaths/min): Photos: [1:No Photos] [3:No Photos] [N/A:N/A] Wound Location: [1:Left Upper Leg - Medial Left Lower Leg - Distal] [N/A:N/A] Wounding Event: [1:Surgical Injury] [3:Surgical Injury] [N/A:N/A] Primary Etiology: [1:Open Surgical Wound] [3:Open Surgical Wound] [N/A:N/A] Comorbid History: [1:Anemia, Asthma, Hypertension, Peripheral Hypertension, Peripheral Venous Disease, Osteoarthritis, Neuropathy Osteoarthritis, Neuropathy] [3:Anemia, Asthma, Venous Disease,] [N/A:N/A] Date Acquired: [1:12/04/2015] [3:12/04/2015] [N/A:N/A] Weeks of Treatment: [1:11] [3:11] [N/A:N/A] Wound Status: [1:Open] [3:Open] [N/A:N/A] Measurements L x W x D 0.4x0.3x0.9 [3:1.7x0.5x0.2] [N/A:N/A] (cm) Area (cm) : [1:0.094] [3:0.668] [N/A:N/A] Volume (cm) : [1:0.085] [3:0.134] [N/A:N/A] % Reduction in Area: [1:90.00%] [3:96.20%] [N/A:N/A] % Reduction in Volume: 93.60% [3:98.90%] [N/A:N/A] Classification: [1:Full Thickness With Exposed Support Structures] [  3:Full Thickness With Exposed Support Structures] [N/A:N/A] Exudate Amount: [1:Medium] [3:Large] [N/A:N/A] Exudate Type: [1:Serosanguineous] [3:Serosanguineous] [N/A:N/A] Exudate Color: [1:red, brown] [3:red, brown] [N/A:N/A] Wound Margin: [1:Epibole] [3:Epibole] [N/A:N/A] Granulation Amount: [1:Large (67-100%)] [3:Medium (34-66%)] [N/A:N/A] Granulation Quality: [1:Red, Pink] [3:Red, Pink, Hyper- granulation] [N/A:N/A] Necrotic Amount: [1:Small (1-33%)] [3:Medium (34-66%)] [N/A:N/A] Exposed Structures: [1:Fat Layer (Subcutaneous Fat Layer (Subcutaneous N/A Tissue) Exposed: Yes] [3:Tissue) Exposed: Yes] Fascia: No Fascia: No Tendon: No Tendon: No Muscle: No Muscle: No Joint: No Joint: No Bone: No Bone: No Epithelialization: Small (1-33%) Small (1-33%) N/A Debridement: N/A Debridement (18590- N/A 11047) Pre-procedure N/A 13:54  N/A Verification/Time Out Taken: Pain Control: N/A Lidocaine 4% Topical N/A Solution Tissue Debrided: N/A Fibrin/Slough, Exudates, N/A Subcutaneous Level: N/A Skin/Subcutaneous N/A Tissue Debridement Area (sq N/A 0.85 N/A cm): Instrument: N/A Curette N/A Bleeding: N/A Minimum N/A Hemostasis Achieved: N/A Pressure N/A Procedural Pain: N/A 0 N/A Post Procedural Pain: N/A 0 N/A Debridement Treatment N/A Procedure was tolerated N/A Response: well Post Debridement N/A 1.7x0.5x0.2 N/A Measurements L x W x D (cm) Post Debridement N/A 0.134 N/A Volume: (cm) Periwound Skin Texture: Scarring: Yes Scarring: Yes N/A Excoriation: No Induration: No Callus: No Crepitus: No Rash: No Periwound Skin No Abnormalities Noted Maceration: No N/A Moisture: Dry/Scaly: No Periwound Skin Color: Erythema: No Atrophie Blanche: No N/A Cyanosis: No Ecchymosis: No Erythema: No Hemosiderin Staining: No Mottled: No Pallor: No Rubor: No Temperature: No Abnormality No Abnormality N/A Tenderness on Yes Yes N/A Palpation: Natasha [name], Natasha [name] ([id number]) Wound Preparation: Ulcer Cleansing: Ulcer Cleansing: N/A Rinsed/Irrigated with Rinsed/Irrigated with Saline Saline Topical Anesthetic Topical Anesthetic Applied: Other: lidocaine Applied: Other: lidocaine 4% 4% Procedures Performed: N/A Debridement N/A Treatment Notes Wound #1 (Left, Medial Upper Leg) 1. Cleansed with: Clean wound with Normal Saline 4. Dressing Applied: Iodoform packing Gauze 5. Secondary Dressing Applied Bordered Foam Dressing Wound #3 (Left, Distal Lower Leg) 1. Cleansed with: Clean wound with Normal Saline 2. Anesthetic Topical Lidocaine 4% cream to wound bed prior to debridement 4. Dressing Applied: Prisma Ag 5. Secondary Dressing Applied ABD and Kerlix/Conform 7. Secured with Recruitment consultant) Signed: 04/05/2016 2:02:12 PM By: Christin Fudge MD, FACS Entered By: Christin Fudge on 04/05/2016  14:02:12 Natasha [name] ([id number]) -------------------------------------------------------------------------------- Multi-Disciplinary Care Plan Details Patient Name: Natasha [name] Date of Service: 04/05/2016 1:30 PM Medical Record Number: [id number] Patient Account Number: [id number] Date of Birth/Sex: Jun 07, 1928 (83 y.o. Female) Treating RN: Montey Hora Primary Care Levie Owensby: Emily Filbert Other Clinician: Referring Elfa Wooton: Emily Filbert Treating Aidan Caloca/Extender: Frann Rider in Treatment: 11 Active Inactive ` Abuse / Safety / Falls / Self Care Management Nursing Diagnoses: Potential for falls Goals: Patient will remain injury free Date Initiated: 01/19/2016 Target Resolution Date: 05/25/2016 Goal Status: Active Interventions: Assess fall risk on admission and as needed Assess: immobility, friction, shearing, incontinence upon admission and as needed Assess impairment of mobility on admission and as needed per policy Notes: ` Nutrition Nursing Diagnoses: Imbalanced nutrition Goals: Patient/caregiver agrees to and verbalizes understanding of need to use nutritional supplements and/or vitamins as prescribed Date Initiated: 01/19/2016 Target Resolution Date: 05/25/2016 Goal Status: Active Interventions: Assess patient nutrition upon admission and as needed per policy Notes: ` Orientation to the [facility] Natasha [name], Natasha [name] ([id number]) Nursing Diagnoses: Knowledge deficit related to the wound healing center program Goals: Patient/caregiver will verbalize understanding of the [place] Date Initiated: 01/19/2016 Target Resolution Date: 05/25/2016 Goal Status: Active Interventions: Provide education on orientation to the wound center Notes: ` Pain, Acute or Chronic  Nursing Diagnoses: Pain, acute or chronic: actual or potential Potential alteration in comfort, pain Goals: Patient will verbalize adequate pain  control and receive pain control interventions during procedures as needed Date Initiated: 01/19/2016 Target Resolution Date: 05/25/2016 Goal Status: Active Patient/caregiver will verbalize adequate pain control between visits Date Initiated: 01/19/2016 Target Resolution Date: 05/25/2016 Goal Status: Active Patient/caregiver will verbalize comfort level met Date Initiated: 01/19/2016 Target Resolution Date: 05/25/2016 Goal Status: Active Interventions: Assess comfort goal upon admission Complete pain assessment as per visit requirements Notes: ` Wound/Skin Impairment Nursing Diagnoses: Impaired tissue integrity Knowledge deficit related to ulceration/compromised skin integrity Goals: Natasha [name], Natasha [name] ([id number]) Ulcer/skin breakdown will have a volume reduction of 30% by week 4 Date Initiated: 01/19/2016 Target Resolution Date: 05/25/2016 Goal Status: Active Ulcer/skin breakdown will have a volume reduction of 50% by week 8 Date Initiated: 01/19/2016 Target Resolution Date: 05/25/2016 Goal Status: Active Ulcer/skin breakdown will have a volume reduction of 80% by week 12 Date Initiated: 01/19/2016 Target Resolution Date: 05/25/2016 Goal Status: Active Interventions: Assess patient/caregiver ability to perform ulcer/skin care regimen upon admission and as needed Assess ulceration(s) every visit Notes: Electronic Signature(s) Signed: 04/05/2016 4:57:21 PM By: Montey Hora Entered By: Montey Hora on 04/05/2016 13:55:32 Natasha [name] ([id number]) -------------------------------------------------------------------------------- Pain Assessment Details Patient Name: Natasha [name] Date of Service: 04/05/2016 1:30 PM Medical Record Number: [id number] Patient Account Number: [id number] Date of Birth/Sex: [birth year] (83 y.o. Female) Treating RN: Montey Hora Primary Care Grey Schlauch: Emily Filbert Other Clinician: Referring Makaleigh Reinard: Emily Filbert Treating  Ryian Lynde/Extender: Frann Rider in Treatment: 11 Active Problems Location of Pain Severity and Description of Pain Patient Has Paino No Site Locations Pain Management and Medication Current Pain Management: Notes Topical or injectable lidocaine is offered to patient for acute pain when surgical debridement is performed. If needed, Patient is instructed to use over the counter pain medication for the following 24-48 hours after debridement. Wound care MDs do not prescribed pain medications. Patient has chronic pain or uncontrolled pain. Patient has been instructed to make an appointment with their Primary Care Physician for pain management. Electronic Signature(s) Signed: 04/05/2016 4:57:21 PM By: Montey Hora Entered By: Montey Hora on 04/05/2016 13:38:29 Natasha [name] ([id number]) -------------------------------------------------------------------------------- Patient/Caregiver Education Details Patient Name: Natasha [name] Date of Service: 04/05/2016 1:30 PM Medical Record Number: [id number] Patient Account Number: [id number] Date of Birth/Gender: 05/31/1928 (83 y.o. Female) Treating RN: Montey Hora Primary Care Physician: Emily Filbert Other Clinician: Referring Physician: Emily Filbert Treating Physician/Extender: Frann Rider in Treatment: 11 Education Assessment Education Provided To: Patient and Caregiver Education Topics Provided Wound/Skin Impairment: Handouts: Other: wound care as ordered Methods: Demonstration, Explain/Verbal Responses: State content correctly Electronic Signature(s) Signed: 04/05/2016 4:57:21 PM By: Montey Hora Entered By: Montey Hora on 04/05/2016 14:00:26 Natasha [name] ([id number]) -------------------------------------------------------------------------------- Wound Assessment Details Patient Name: Natasha [name] Date of Service: 04/05/2016 1:30 PM Medical Record Number: [id number] Patient  Account Number: [id number] Date of Birth/Sex: 05-31-1928 (83 y.o. Female) Treating RN: Montey Hora Primary Care Keishawn Rajewski: Emily Filbert Other Clinician: Referring Shearon Clonch: Emily Filbert Treating Dray Dente/Extender: Frann Rider in Treatment: 11 Wound Status Wound Number: 1 Primary Open Surgical Wound Etiology: Wound Location: Left Upper Leg - Medial Wound Open Wounding Event: Surgical Injury Status: Date Acquired: 12/04/2015 Comorbid Anemia, Asthma, Hypertension, Weeks Of Treatment: 11 History: Peripheral Venous Disease, Clustered Wound: No Osteoarthritis, Neuropathy Photos Photo Uploaded By: Montey Hora on 04/05/2016 16:26:31 Wound Measurements Length: (cm) 0.4 Width: (cm) 0.3 Depth: (cm) 0.9 Area: (cm) 0.094 Volume: (cm)  0.085 % Reduction in Area: 90% % Reduction in Volume: 93.6% Epithelialization: Small (1-33%) Tunneling: No Undermining: No Wound Description Full Thickness With Exposed Classification: Support Structures Wound Margin: Epibole Exudate Medium Amount: Exudate Type: Serosanguineous Exudate Color: red, brown Foul Odor After Cleansing: No Slough/Fibrino Yes Wound Bed Granulation Amount: Large (67-100%) Exposed Structure Granulation Quality: Red, Pink Fascia Exposed: No Natasha [name], Natasha [name] ([id number]) Necrotic Amount: Small (1-33%) Fat Layer (Subcutaneous Tissue) Exposed: Yes Necrotic Quality: Adherent Slough Tendon Exposed: No Muscle Exposed: No Joint Exposed: No Bone Exposed: No Periwound Skin Texture Texture Color No Abnormalities Noted: No No Abnormalities Noted: No Scarring: Yes Erythema: No Moisture Temperature / Pain No Abnormalities Noted: No Temperature: No Abnormality Tenderness on Palpation: Yes Wound Preparation Ulcer Cleansing: Rinsed/Irrigated with Saline Topical Anesthetic Applied: Other: lidocaine 4%, Treatment Notes Wound #1 (Left, Medial Upper Leg) 1. Cleansed with: Clean wound with Normal  Saline 4. Dressing Applied: Iodoform packing Gauze 5. Secondary Dressing Applied Bordered Foam Dressing Electronic Signature(s) Signed: 04/05/2016 4:57:21 PM By: Montey Hora Entered By: Montey Hora on 04/05/2016 13:54:25 Natasha [name] ([id number]) -------------------------------------------------------------------------------- Wound Assessment Details Patient Name: Natasha [name] Date of Service: 04/05/2016 1:30 PM Medical Record Number: [id number] Patient Account Number: [id number] Date of Birth/Sex: February 08, 1928 (83 y.o. Female) Treating RN: Montey Hora Primary Care Bonner Larue: Emily Filbert Other Clinician: Referring Jaline Pincock: Emily Filbert Treating Jakwan Sally/Extender: Frann Rider in Treatment: 11 Wound Status Wound Number: 3 Primary Open Surgical Wound Etiology: Wound Location: Left Lower Leg - Distal Wound Open Wounding Event: Surgical Injury Status: Date Acquired: 12/04/2015 Comorbid Anemia, Asthma, Hypertension, Weeks Of Treatment: 11 History: Peripheral Venous Disease, Clustered Wound: No Osteoarthritis, Neuropathy Photos Photo Uploaded By: Montey Hora on 04/05/2016 16:26:32 Wound Measurements Length: (cm) 1.7 Width: (cm) 0.5 Depth: (cm) 0.2 Area: (cm) 0.668 Volume: (cm) 0.134 % Reduction in Area: 96.2% % Reduction in Volume: 98.9% Epithelialization: Small (1-33%) Tunneling: No Undermining: No Wound Description Full Thickness With Exposed Classification: Support Structures Wound Margin: Epibole Exudate Large Amount: Exudate Type: Serosanguineous Exudate Color: red, brown Foul Odor After Cleansing: No Slough/Fibrino Yes Wound Bed Granulation Amount: Medium (34-66%) Exposed Structure Granulation Quality: Red, Pink, Hyper-granulation Fascia Exposed: No Natasha [name], Natasha [name] ([id number]) Necrotic Amount: Medium (34-66%) Fat Layer (Subcutaneous Tissue) Exposed: Yes Necrotic Quality: Adherent Slough Tendon Exposed:  No Muscle Exposed: No Joint Exposed: No Bone Exposed: No Periwound Skin Texture Texture Color No Abnormalities Noted: No No Abnormalities Noted: No Callus: No Atrophie Blanche: No Crepitus: No Cyanosis: No Excoriation: No Ecchymosis: No Induration: No Erythema: No Rash: No Hemosiderin Staining: No Scarring: Yes Mottled: No Pallor: No Moisture Rubor: No No Abnormalities Noted: No Dry / Scaly: No Temperature / Pain Maceration: No Temperature: No Abnormality Tenderness on Palpation: Yes Wound Preparation Ulcer Cleansing: Rinsed/Irrigated with Saline Topical Anesthetic Applied: Other: lidocaine 4%, Treatment Notes Wound #3 (Left, Distal Lower Leg) 1. Cleansed with: Clean wound with Normal Saline 2. Anesthetic Topical Lidocaine 4% cream to wound bed prior to debridement 4. Dressing Applied: Prisma Ag 5. Secondary Dressing Applied ABD and Kerlix/Conform 7. Secured with Recruitment consultant) Signed: 04/05/2016 4:57:21 PM By: Montey Hora Entered By: Montey Hora on 04/05/2016 13:54:54 Natasha [name] ([id number]) -------------------------------------------------------------------------------- Vitals Details Patient Name: Natasha [name] Date of Service: 04/05/2016 1:30 PM Medical Record Number: [id number] Patient Account Number: [id number] Date of Birth/Sex: 1928-05-19 (84 y.o. Female) Treating RN: Montey Hora Primary Care Sallee Hogrefe: Emily Filbert Other Clinician: Referring Marbin Olshefski: Emily Filbert Treating Celedonio Sortino/Extender: Frann Rider in Treatment: 11 Vital Signs Time Taken:  13:43 Temperature (F): 98.2 Height (in): 59 Pulse (bpm): 73 Weight (lbs): 140 Respiratory Rate (breaths/min): 18 Body Mass Index (BMI): 28.3 Blood Pressure (mmHg): 138/52 Reference Range: 80 - 120 mg / dl Electronic Signature(s) Signed: 04/05/2016 4:57:21 PM By: Montey Hora Entered By: Montey Hora on 04/05/2016 13:43:05

## 2016-04-12 ENCOUNTER — Encounter: Payer: Medicare Other | Attending: Surgery | Admitting: Surgery

## 2016-04-12 DIAGNOSIS — Z87891 Personal history of nicotine dependence: Secondary | ICD-10-CM | POA: Diagnosis not present

## 2016-04-12 DIAGNOSIS — I890 Lymphedema, not elsewhere classified: Secondary | ICD-10-CM | POA: Insufficient documentation

## 2016-04-12 DIAGNOSIS — E1140 Type 2 diabetes mellitus with diabetic neuropathy, unspecified: Secondary | ICD-10-CM | POA: Diagnosis not present

## 2016-04-12 DIAGNOSIS — Z79899 Other long term (current) drug therapy: Secondary | ICD-10-CM | POA: Diagnosis not present

## 2016-04-12 DIAGNOSIS — G2581 Restless legs syndrome: Secondary | ICD-10-CM | POA: Diagnosis not present

## 2016-04-12 DIAGNOSIS — L97122 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 exposed: Secondary | ICD-10-CM | POA: Diagnosis not present

## 2016-04-12 DIAGNOSIS — Z7982 Long term (current) use of aspirin: Secondary | ICD-10-CM | POA: Diagnosis not present

## 2016-04-12 DIAGNOSIS — K219 Gastro-esophageal reflux disease without esophagitis: Secondary | ICD-10-CM | POA: Insufficient documentation

## 2016-04-12 DIAGNOSIS — I10 Essential (primary) hypertension: Secondary | ICD-10-CM | POA: Insufficient documentation

## 2016-04-12 DIAGNOSIS — G500 Trigeminal neuralgia: Secondary | ICD-10-CM | POA: Diagnosis not present

## 2016-04-12 DIAGNOSIS — I70242 Atherosclerosis 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ration of calf: Secondary | ICD-10-CM | POA: Diagnosis not present

## 2016-04-12 DIAGNOSIS — L97222 Non-pressure chronic ulcer of left calf with fat layer exposed: Secondary | ICD-10-CM | POA: Insufficient documentation

## 2016-04-12 DIAGNOSIS — J449 Chronic obstructive pulmonary disease, unspecified: Secondary | ICD-10-CM | POA: Insufficient documentation

## 2016-04-12 DIAGNOSIS — I2510 Atherosclerotic heart disease of native coronary artery without angina pectoris: Secondary | ICD-10-CM | POA: Insufficient documentation

## 2016-04-12 DIAGNOSIS — M1990 Unspecified osteoarthritis, unspecified site: Secondary | ICD-10-CM | POA: Diagnosis not present

## 2016-04-12 DIAGNOSIS — D649 Anemia, unspecified: Secondary | ICD-10-CM | POA: Diagnosis not present

## 2016-04-13 NOTE — Progress Notes (Signed)
PHILLIS, THACKERAY ([id number]) Visit Report for 04/12/2016 Chief Complaint Document Details Patient Name: Natasha [name], Natasha [name] Date of Service: 04/12/2016 2:30 PM Medical Record Number: [id number] Patient Account Number: [id number] Date of Birth/Sex: November 03, 1928 (83 y.o. Female) Treating RN: Montey Hora Primary Care Provider: Emily Filbert Other Clinician: Referring Provider: Emily Filbert Treating Provider/Extender: Frann Rider in Treatment: 12 Information Obtained from: Patient Chief Complaint Patient presents to the wound care center today with an open arterial ulcer along with diabetes mellitus to the left lower extremity which she's had for over a month Electronic Signature(s) Signed: 04/12/2016 3:06:25 PM By: Christin Fudge MD, FACS Entered By: Christin Fudge on 04/12/2016 15:06:24 Natasha [name] ([id number]) -------------------------------------------------------------------------------- HPI Details Patient Name: Natasha [name] Date of Service: 04/12/2016 2:30 PM Medical Record Number: [id number] Patient Account Number: [id number] Date of Birth/Sex: 1928-02-25 (83 y.o. Female) Treating RN: Montey Hora Primary Care Provider: Emily Filbert Other Clinician: Referring Provider: Emily Filbert Treating Provider/Extender: Frann Rider in Treatment: 12 History of Present Illness Location: left thigh and left calf in the area where there was previous vascular surgery Quality: Patient reports experiencing a dull pain to affected area(s). Severity: Patient states wound (s) are getting better. Duration: Patient has had the wound for > 2 months prior to seeking treatment at the wound center Timing: Pain in wound is Intermittent (comes and goes Context: The wound occurred when the patient had vascular surgery and the wounds were not healing very well Modifying Factors: Other treatment(s) tried include:local care and oral antibiotics Associated Signs and Symptoms:  Patient reports having increase swelling. HPI Description: 83 year old female with significant past medical history of anemia, cellulitis and abscess of the leg, COPD, coronary artery disease, hypertension, osteoarthritis, peripheral vascular disease with claudication, trigeminal neuralgia and type 2 diabetes mellitus without complications. A past surgical history significant for appendectomy, left femoral o peroneal bypass graft in October 2017, and debridement of skin and subcutis tissue on the left side in December 2017. She was treated by Dr. Lucky Cowboy earlier, with multiple left leg revascularization both endovascular and open. She is a former smoker who quit 30 years ago. When she was last seen at [facility], at the vascular surgery clinic she had a left lower extremity 3 separate wounds along the saphenectomy site. She was placed on Bactrim DS for 10 days. She had a good signal over the peroneal artery and the bypass remains patent. Prior to surgery her left ABI was 0.46 and a right was 0.98. Note the patient was seen by Dr. Amalia Hailey of podiatry on 01/15/2016 and he saw for ulceration of her left great toe. Most recently the patient was seen by Dr. Maura Crandall, the vascular surgeon on January 5 and these notes are reviewed on Epic-- he noted that the 3 wounds on the left lower extremity are granulation very slowly, and he recommended packing with wet-to-dry Kerlix. The surgeon noted excellent signal over the peroneal artery and the recommendation was to see as at the wound center to help with healing. 02/12/2016 -- we have not seen her for 2 weeks and her husband tells me that she was admitted to the hospital with a pneumonia. I have reviewed a hospital notes, she was there between January 19 and January 22, with clinical sepsis and bilateral pneumonia, fever and leukocytosis and was treated with vancomycin and Zosyn. Vancomycin was then stopped and Zithromax was added. He was discharged home on  Augmentin 02/23/2016 -- she had a arterial duplex study done recently at [facility] and her right ABI was 0.92 and the left ABI was 0.96. The femoral to peroneal bypass is widely patent. she was also seen by the surgeon who noted a postoperative seroma in the left groin. she was asked to return in 3 months for duplex examination 03/01/16 -she has got a lot of lymphedema for left lower extremity and there is weeping from the lower lateral leg. She had been walking around a lot yesterday as she had gone shopping. [name], [name] ([id number]) 03/08/2016 -- and did used to have a lot of lymphedema for left lower extremity but does not have much pain. 04/05/2016 -- She was admitted last week between March 17 to March 21 with acute respiratory failure, community-acquired pneumonia, cellulitis of the right leg, right upper lobe pneumonia was seen on CT scan. Was treated with vancomycin and Zosyn at the time of admission and switched over to doxycycline upon discharge home. Xray of the right foot showed no fracture or dislocation and no soft tissue abnormality. Electronic Signature(s) Signed: 04/12/2016 3:07:07 PM By: Christin Fudge MD, FACS Entered By: Christin Fudge on 04/12/2016 15:07:07 Natasha [name] ([id number]) -------------------------------------------------------------------------------- Physical Exam Details Patient Name: Natasha [name] Date of Service: 04/12/2016 2:30 PM Medical Record Number: [id number] Patient Account Number: [id number] Date of Birth/Sex: May 28, 1928 (83 y.o. Female) Treating RN: Montey Hora Primary Care Provider: Emily Filbert Other Clinician: Referring Provider: Emily Filbert Treating Provider/Extender: Frann Rider in Treatment: 12 Constitutional . Pulse regular. Respirations normal and unlabored. Afebrile. . Eyes Nonicteric. Reactive to light. Ears, Nose, Mouth, and Throat Lips, teeth, and gums WNL.Marland Kitchen Moist mucosa without  lesions. Neck supple and nontender. No palpable supraclavicular or cervical adenopathy. Normal sized without goiter. Respiratory WNL. No retractions.. Breath sounds WNL, No rubs, rales, rhonchi, or wheeze.. Cardiovascular Heart rhythm and rate regular, no murmur or gallop.. Pedal Pulses WNL. No clubbing, cyanosis or edema. Chest Breasts symmetical and no nipple discharge.. Breast tissue WNL, no masses, lumps, or tenderness.. Lymphatic No adneopathy. No adenopathy. No adenopathy. Musculoskeletal Adexa without tenderness or enlargement.. Digits and nails w/o clubbing, cyanosis, infection, petechiae, ischemia, or inflammatory conditions.. Integumentary (Hair, Skin) No suspicious lesions. No crepitus or fluctuance. No peri-wound warmth or erythema. No masses.Marland Kitchen Psychiatric Judgement and insight Intact.. No evidence of depression, anxiety, or agitation.. Notes the wounds on the left lower extremity are looking much better with minimal depth to the thigh wound and the wound on the left medial calf is tiny with good epithelialization. She does continue to have lymphedema. Electronic Signature(s) Signed: 04/12/2016 3:07:43 PM By: Christin Fudge MD, FACS Entered By: Christin Fudge on 04/12/2016 15:07:42 Natasha [name] ([id number]) -------------------------------------------------------------------------------- Physician Orders Details Patient Name: Natasha [name] Date of Service: 04/12/2016 2:30 PM Medical Record Number: [id number] Patient Account Number: [id number] Date of Birth/Sex: 01-15-1928 (83 y.o. Female) Treating RN: Montey Hora Primary Care Provider: Emily Filbert Other Clinician: Referring Provider: Emily Filbert Treating Provider/Extender: Frann Rider in Treatment: 12 Verbal / Phone Orders: No Diagnosis Coding Wound Cleansing Wound #1 Left,Medial Upper Leg o Clean wound with Normal Saline. o Cleanse wound with mild soap and water Wound #3 Left,Distal  Lower Leg o Clean wound with Normal Saline. o Cleanse wound with mild soap and water Anesthetic Wound #1 Left,Medial Upper Leg o Topical Lidocaine 4% cream applied to wound bed prior to debridement - for clinic use Wound #3 Left,Distal Lower Leg o Topical Lidocaine 4% cream applied to wound bed prior to debridement - for clinic use Skin Barriers/Peri-Wound Care  Wound #1 Left,Medial Upper Leg o Skin Prep Wound #3 Left,Distal Lower Leg o Skin Prep Primary Wound Dressing Wound #1 Left,Medial Upper Leg o Iodoform packing Gauze Wound #3 Left,Distal Lower Leg o Prisma Ag Secondary Dressing Wound #1 Left,Medial Upper Leg o Dry Gauze o Boardered Foam Dressing Wound #3 Left,Distal Lower Leg CEOLA, PARA. ([id number]) o ABD and Kerlix/Conform Dressing Change Frequency Wound #1 Left,Medial Upper Leg o Change dressing every other day. Wound #3 Left,Distal Lower Leg o Change dressing every other day. Follow-up Appointments Wound #1 Left,Medial Upper Leg o Return Appointment in 1 week. Wound #3 Left,Distal Lower Leg o Return Appointment in 1 week. Edema Control Wound #1 Left,Medial Upper Leg o Elevate legs to the level of the heart and pump ankles as often as possible Wound #3 Left,Distal Lower Leg o Elevate legs to the level of the heart and pump ankles as often as possible Additional Orders / Instructions Wound #1 Left,Medial Upper Leg o Increase protein intake. Wound #3 Left,Distal Lower Leg o Increase protein intake. Home Health Wound #1 Left,Medial Upper Leg o Continue Home Health Visits o Home Health Nurse may visit PRN to address patientos wound care needs. o FACE TO FACE ENCOUNTER: MEDICARE and MEDICAID PATIENTS: I certify that this patient is under my care and that I had a face-to-face encounter that meets the physician face-to-face encounter requirements with this patient on this date. The encounter with the patient was  in whole or in part for the following MEDICAL CONDITION: (primary reason for [place]) MEDICAL NECESSITY: I certify, that based on my findings, NURSING services are a medically necessary home health service. HOME BOUND STATUS: I certify that my clinical findings support that this patient is homebound (i.e., Due to illness or injury, pt requires aid of supportive devices such as crutches, cane, wheelchairs, walkers, the use of special transportation or the assistance of another person to leave their place of residence. There is a normal inability to leave the home and doing so requires considerable and taxing effort. Other absences are for medical reasons / religious services and are infrequent or of short duration when for other reasons). DEBARAH, MCCUMBERS ([id number]) o If current dressing causes regression in wound condition, may D/C ordered dressing product/s and apply Normal Saline Moist Dressing daily until next [place] / Other MD appointment. [place] of regression in wound condition at [phone]. o Please direct any NON-WOUND related issues/requests for orders to patient's Primary Care Physician Wound #3 Left,Distal Lower Leg o [place] Nurse may visit PRN to address patientos wound care needs. o FACE TO FACE ENCOUNTER: MEDICARE and MEDICAID PATIENTS: I certify that this patient is under my care and that I had a face-to-face encounter that meets the physician face-to-face encounter requirements with this patient on this date. The encounter with the patient was in whole or in part for the following MEDICAL CONDITION: (primary reason for [place]) MEDICAL NECESSITY: I certify, that based on my findings, NURSING services are a medically necessary home health service. HOME BOUND STATUS: I certify that my clinical findings support that this patient is homebound (i.e., Due to illness or injury, pt  requires aid of supportive devices such as crutches, cane, wheelchairs, walkers, the use of special transportation or the assistance of another person to leave their place of residence. There is a normal inability to leave the home and doing so requires considerable and taxing effort. Other absences are for medical  reasons / religious services and are infrequent or of short duration when for other reasons). o If current dressing causes regression in wound condition, may D/C ordered dressing product/s and apply Normal Saline Moist Dressing daily until next [place] / Other MD appointment. [place] of regression in wound condition at [phone]. o Please direct any NON-WOUND related issues/requests for orders to patient's Primary Care Physician Electronic Signature(s) Signed: 04/12/2016 4:17:13 PM By: Christin Fudge MD, FACS Signed: 04/12/2016 4:56:18 PM By: Montey Hora Entered By: Montey Hora on 04/12/2016 14:48:13 Natasha [name] ([id number]) -------------------------------------------------------------------------------- Problem List Details Patient Name: Natasha [name] Date of Service: 04/12/2016 2:30 PM Medical Record Number: [id number] Patient Account Number: [id number] Date of Birth/Sex: January 02, 1929 (83 y.o. 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 Female) Treating RN: Montey Hora Primary Care Provider: Emily Filbert Other Clinician: Referring Provider: Emily Filbert Treating Provider/Extender: Frann Rider in Treatment: 12 Subjective Chief Complaint Information obtained from Patient Patient presents to the wound care center today with an open arterial ulcer along with diabetes mellitus to the left lower extremity which she's had for over a month History of Present Illness (HPI) The following HPI elements were documented for the patient's wound: Location: left thigh and left calf in the area where there was previous vascular surgery Quality: Patient reports experiencing a dull pain to affected area(s). Severity: Patient states wound (s) are getting better. Duration: Patient has had the wound for > 2 months prior to seeking treatment at the wound center Timing: Pain in wound is Intermittent (comes and goes Context: The wound occurred when the patient had vascular surgery and the wounds were not healing very well Modifying Factors: Other treatment(s) tried include:local care and oral antibiotics Associated Signs and Symptoms: Patient reports having increase swelling. 83 year old female with significant past medical history of anemia, cellulitis and abscess of the leg, COPD, coronary artery disease, hypertension, osteoarthritis, peripheral vascular disease with claudication, trigeminal neuralgia and type 2 diabetes mellitus without complications. A past surgical history significant for appendectomy,  left femoral peroneal bypass graft in October 2017, and debridement of skin and subcutis tissue on the left side in December 2017. She was treated by Dr. Lucky Cowboy earlier, with multiple left leg revascularization both endovascular and open. She is a former smoker who quit 30 years ago. When she was last seen at [facility], at the vascular surgery clinic she had a left lower extremity 3 separate wounds along the saphenectomy site. She was placed on Bactrim DS for 10 days. She had a good signal over the peroneal artery and the bypass remains patent. Prior to surgery her left ABI was 0.46 and a right was 0.98. Note the patient was seen by Dr. Amalia Hailey of podiatry on 01/15/2016  and he saw for ulceration of her left great toe. Most recently the patient was seen by Dr. Maura Crandall, the vascular surgeon on January 5 and these notes are reviewed on Epic-- he noted that the 3 wounds on the left lower extremity are granulation very slowly, and he recommended packing with wet-to-dry Kerlix. The surgeon noted excellent signal over the peroneal artery and the recommendation was to see as at the wound center to help with healing. 02/12/2016 -- we have not seen her for 2 weeks and her husband tells me that she was admitted to the hospital with a pneumonia. I have reviewed a hospital notes, she was there between January 19 and January 22, with clinical sepsis and bilateral pneumonia, fever and leukocytosis and was treated with TYLEAH, LOH. ([id number]) vancomycin and Zosyn. Vancomycin was then stopped and Zithromax was added. He was discharged home on Augmentin 02/23/2016 -- she had a arterial duplex study done recently at [facility] and her right ABI was 0.92 and the left ABI was 0.96. The femoral to peroneal bypass is widely patent. she was also seen by the surgeon who noted a postoperative seroma in the left groin. she was asked to return in 3 months for duplex examination 03/01/16 -she has got a lot of  lymphedema for left lower extremity and there is weeping from the lower lateral leg. She had been walking around a lot yesterday as she had gone shopping. 03/08/2016 -- and did used to have a lot of lymphedema for left lower extremity but does not have much pain. 04/05/2016 -- She was admitted last week between March 17 to March 21 with acute respiratory failure, community-acquired pneumonia, cellulitis of the right leg, right upper lobe pneumonia was seen on CT scan. Was treated with vancomycin and Zosyn at the time of admission and switched over to doxycycline upon discharge home. Xray of the right foot showed no fracture or dislocation and no soft tissue abnormality. Objective Constitutional Pulse regular. Respirations normal and unlabored. Afebrile. Vitals Time Taken: 2:33 PM, Height: 59 in, Weight: 140 lbs, BMI: 28.3, Temperature: 98.5 F, Pulse: 84 bpm, Respiratory Rate: 18 breaths/min, Blood Pressure: 135/51 mmHg. Eyes Nonicteric. Reactive to light. Ears, Nose, Mouth, and Throat Lips, teeth, and gums WNL.Marland Kitchen Moist mucosa without lesions. Neck supple and nontender. No palpable supraclavicular or cervical adenopathy. Normal sized without goiter. Respiratory WNL. No retractions.. Breath sounds WNL, No rubs, rales, rhonchi, or wheeze.. Cardiovascular Heart rhythm and rate regular, no murmur or gallop.. Pedal Pulses WNL. No clubbing, cyanosis or edema. Chest Breasts symmetical and no nipple discharge.. Breast tissue WNL, no masses, lumps, or tenderness.KANYLA, OMEARA ([id number]) Lymphatic No adneopathy. No adenopathy. No adenopathy. Musculoskeletal Adexa without tenderness or enlargement.. Digits and nails w/o clubbing, cyanosis, infection, petechiae, ischemia, or inflammatory conditions.Marland Kitchen Psychiatric Judgement and insight Intact.. No evidence of depression, anxiety, or agitation.. General Notes: the wounds on the left lower extremity are looking much better with minimal  depth to the thigh wound and the wound on the left medial calf is tiny with good epithelialization. She does continue to have lymphedema. Integumentary (Hair, Skin) No suspicious lesions. No crepitus or fluctuance. No peri-wound warmth or erythema. No masses.. Wound #1 status is Open. Original cause of wound was Surgical Injury. The wound is located on the Left,Medial Upper Leg. The wound measures 0.3cm length x 0.2cm width x 0.8cm depth; 0.047cm^2 area and 0.038cm^3 volume. There is Fat Layer (Subcutaneous Tissue) Exposed exposed. There is no tunneling or undermining  noted. There is a medium amount of serosanguineous drainage noted. The wound margin is epibole. There is large (67-100%) red, pink granulation within the wound bed. There is a small (1-33%) amount of necrotic tissue within the wound bed including Adherent Slough. The periwound skin appearance exhibited: Scarring. The periwound skin appearance did not exhibit: Erythema. Periwound temperature was noted as No Abnormality. The periwound has tenderness on palpation. Wound #3 status is Open. Original cause of wound was Surgical Injury. The wound is located on the Left,Distal Lower Leg. The wound measures 1.2cm length x 0.3cm width x 0.2cm depth; 0.283cm^2 area and 0.057cm^3 volume. There is Fat Layer (Subcutaneous Tissue) Exposed exposed. There is no tunneling or undermining noted. There is a large amount of serosanguineous drainage noted. The wound margin is epibole. There is large (67-100%) red, pink granulation within the wound bed. There is a small (1-33%) amount of necrotic tissue within the wound bed including Adherent Slough. The periwound skin appearance exhibited: Scarring. The periwound skin appearance did not exhibit: Callus, Crepitus, Excoriation, Induration, Rash, Dry/Scaly, Maceration, Atrophie Blanche, Cyanosis, Ecchymosis, Hemosiderin Staining, Mottled, Pallor, Rubor, Erythema. Periwound temperature was noted as No  Abnormality. The periwound has tenderness on palpation. 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 ([id number]) I89.0 - Lymphedema, not elsewhere classified Plan Wound Cleansing: Wound #1 Left,Medial Upper Leg: Clean wound with Normal Saline. Cleanse wound with mild soap and water Wound #3 Left,Distal Lower Leg: Clean wound with Normal Saline. Cleanse wound with mild soap and water Anesthetic: Wound #1 Left,Medial Upper Leg: Topical Lidocaine 4% cream applied to wound bed prior to debridement - for clinic use Wound #3 Left,Distal Lower Leg: Topical Lidocaine 4% cream applied to wound bed prior to debridement - for clinic use Skin Barriers/Peri-Wound Care: Wound #1 Left,Medial Upper Leg: Skin Prep Wound #3 Left,Distal Lower Leg: Skin Prep Primary Wound Dressing: Wound #1 Left,Medial Upper Leg: Iodoform packing Gauze Wound #3 Left,Distal Lower Leg: Prisma Ag Secondary Dressing: Wound #1 Left,Medial Upper Leg: Dry Gauze Boardered Foam Dressing Wound #3 Left,Distal Lower Leg: ABD and Kerlix/Conform Dressing Change Frequency: Wound #1 Left,Medial Upper Leg: Change dressing every other day. Wound #3 Left,Distal Lower Leg: Change dressing every other day. Follow-up Appointments: Wound #1 Left,Medial Upper Leg: Return Appointment in 1 week. Wound #3 Left,Distal Lower Leg: Return Appointment in 1 week. Edema Control: Wound #1 Left,Medial Upper Leg: Elevate legs to the level of the heart and pump ankles as often as possible KENDALYNN, WIDEMAN. ([id number]) Wound #3 Left,Distal Lower Leg: Elevate legs to the level of the heart and pump ankles as often as possible Additional Orders / Instructions: Wound #1 Left,Medial Upper  Leg: Increase protein intake. Wound #3 Left,Distal Lower Leg: Increase protein intake. Home Health: Wound #1 Left,Medial Upper Leg: [place] Nurse may visit PRN to address patient s wound care needs. FACE TO FACE ENCOUNTER: MEDICARE and MEDICAID PATIENTS: I certify that this patient is under my care and that I had a face-to-face encounter that meets the physician face-to-face encounter requirements with this patient on this date. The encounter with the patient was in whole or in part for the following MEDICAL CONDITION: (primary reason for [place]) MEDICAL NECESSITY: I certify, that based on my findings, NURSING services are a medically necessary home  health service. HOME BOUND STATUS: I certify that my clinical findings support that this patient is homebound (i.e., Due to illness or injury, pt requires aid of supportive devices such as crutches, cane, wheelchairs, walkers, the use of special transportation or the assistance of another person to leave their place of residence. There is a normal inability to leave the home and doing so requires considerable and taxing effort. Other absences are for medical reasons / religious services and are infrequent or of short duration when for other reasons). If current dressing causes regression in wound condition, may D/C ordered dressing product/s and apply Normal Saline Moist Dressing daily until next [place] / Other MD appointment. [place] of regression in wound condition at [phone]. Please direct any NON-WOUND related issues/requests for orders to patient's Primary Care Physician Wound #3 Left,Distal Lower Leg: [place] Nurse may visit PRN to address patient s wound care needs. FACE TO FACE ENCOUNTER: MEDICARE and MEDICAID PATIENTS: I certify that this patient is under my care and that I had a face-to-face encounter that meets the physician  face-to-face encounter requirements with this patient on this date. The encounter with the patient was in whole or in part for the following MEDICAL CONDITION: (primary reason for [place]) MEDICAL NECESSITY: I certify, that based on my findings, NURSING services are a medically necessary home health service. HOME BOUND STATUS: I certify that my clinical findings support that this patient is homebound (i.e., Due to illness or injury, pt requires aid of supportive devices such as crutches, cane, wheelchairs, walkers, the use of special transportation or the assistance of another person to leave their place of residence. There is a normal inability to leave the home and doing so requires considerable and taxing effort. Other absences are for medical reasons / religious services and are infrequent or of short duration when for other reasons). If current dressing causes regression in wound condition, may D/C ordered dressing product/s and apply Normal Saline Moist Dressing daily until next [place] / Other MD appointment. [place] of regression in wound condition at [phone]. Please direct any NON-WOUND related issues/requests for orders to patient's Primary Care Physician I have recommended: SHLOKA, BALDRIDGE ([id number]) 1. packing of the thigh wound with 1/4 inch iodoform gauze and an appropriate foam border. 2. the wound lower down will be covered with Prisma ag and an appropriate bordered foam 3. elevation and exercise of both lower extremities 4. Good amount of protein intake, vitamin A, vitamin C and zinc 5. Regular visits to the wound center 6. to complete the doxycycline which she was placed on in the hospital Electronic Signature(s) Signed: 04/12/2016 3:08:26 PM By: Christin Fudge MD, FACS Entered By: Christin Fudge on 04/12/2016 15:08:26 Natasha [name]  ([id number]) -------------------------------------------------------------------------------- SuperBill Details Patient Name: Natasha [name] Date of Service: 04/12/2016 Medical Record Number: [id number] Patient Account Number: [id number] Date of Birth/Sex: 08-Jun-1928 (83 y.o. 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 exposed I89.0 Lymphedema, not elsewhere classified Facility Procedures CPT4 Code: 35573220 Description: 99213 - WOUND CARE VISIT-LEV 3 EST PT Modifier: Quantity: 1 Physician Procedures CPT4 Code Description:  [id number] 38937 - WC PHYS LEVEL 3 - EST PT ICD-10 Description Diagnosis I70.242 Atherosclerosis 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 L97.222 Non-pressure chronic ulcer of left calf with fat layer e L97.122 Non-pressure chronic  ulcer of left thigh with fat layer I89.0 Lymphedema, not elsewhere classified Modifier: ration of ca xposed exposed Quantity: 1 lf Electronic Signature(s) Signed: 04/12/2016 3:12:46 PM By: Montey Hora Signed: 04/12/2016 4:17:13 PM By: Christin Fudge MD, FACS Previous Signature: 04/12/2016 3:08:46 PM Version By: Christin Fudge MD, FACS Entered By: Montey Hora on 04/12/2016 15:12:46

## 2016-04-13 NOTE — Progress Notes (Signed)
Natasha [name], Natasha [name] ([id number]) Visit Report for 04/12/2016 Arrival Information Details Patient Name: Natasha [name] Date of Service: 04/12/2016 2:30 PM Medical Record Number: [id number] Patient Account Number: [id number] Date of Birth/Sex: 10-15-1928 (83 y.o. Female) Treating RN: Montey Hora Primary Care Aliyanah Rozas: Emily Filbert Other Clinician: Referring Hayslee Casebolt: Emily Filbert Treating Toniesha Zellner/Extender: Frann Rider in Treatment: 12 Visit Information History Since Last Visit Added or deleted any medications: No Patient Arrived: Walker Any new allergies or adverse reactions: No Arrival Time: 14:28 Had a fall or experienced change in No Accompanied By: spouse activities of daily living that may affect Transfer Assistance: None risk of falls: Patient Identification Verified: Yes Signs or symptoms of abuse/neglect since last No Secondary Verification Process Yes visito Completed: Hospitalized since last visit: No Patient Requires Transmission- No Has Dressing in Place as Prescribed: Yes Based Precautions: Pain Present Now: No Patient Has Alerts: Yes Patient Alerts: Patient on Blood Thinner Plavix Electronic Signature(s) Signed: 04/12/2016 4:56:18 PM By: Montey Hora Entered By: Montey Hora on 04/12/2016 14:30:16 Natasha [name] ([id number]) -------------------------------------------------------------------------------- Clinic Level of Care Assessment Details Patient Name: Natasha [name] Date of Service: 04/12/2016 2:30 PM Medical Record Number: [id number] Patient Account Number: [id number] Date of Birth/Sex: 08-May-1928 (83 y.o. Female) Treating RN: Montey Hora Primary Care Jalisha Enneking: Emily Filbert Other Clinician: Referring Jackeline Gutknecht: Emily Filbert Treating Jessilyn Catino/Extender: Frann Rider in Treatment: 12 Clinic Level of Care Assessment Items TOOL 4 Quantity Score _0  - Use when only an EandM is performed on FOLLOW-UP visit  0 ASSESSMENTS - Nursing Assessment / Reassessment X - Reassessment of Co-morbidities (includes updates in patient status) 1 10 X - Reassessment of Adherence to Treatment Plan 1 5 ASSESSMENTS - Wound and Skin Assessment / Reassessment _1  - Simple Wound Assessment / Reassessment - one wound 0 X - Complex Wound Assessment / Reassessment - multiple wounds 2 5 _2  - Dermatologic / Skin Assessment (not related to wound area) 0 ASSESSMENTS - Focused Assessment _3  - Circumferential Edema Measurements - multi extremities 0 _4  - Nutritional Assessment / Counseling / Intervention 0 X - Lower Extremity Assessment (monofilament, tuning fork, pulses) 1 5 _5  - Peripheral Arterial Disease Assessment (using hand held doppler) 0 ASSESSMENTS - Ostomy and/or Continence Assessment and Care _6  - Incontinence Assessment and Management 0 _7  - Ostomy Care Assessment and Management (repouching, etc.) 0 PROCESS - Coordination of Care X - Simple Patient / Family Education for ongoing care 1 15 _8  - Complex (extensive) Patient / Family Education for ongoing care 0 _9  - Staff obtains Programmer, systems, Records, Test Results / Process Orders 0 _10  - Staff telephones HHA, Nursing Homes / Clarify orders / etc 0 _11  - Routine Transfer to another Facility (non-emergent condition) 0 Natasha [name], Natasha [name] ([id number]) _12  - Routine Hospital Admission (non-emergent condition) 0 _13  - New Admissions / Biomedical engineer / Ordering NPWT, Apligraf, etc. 0 _14  - Emergency Hospital Admission (emergent condition) 0 X - Simple Discharge Coordination 1 10 _15  - Complex (extensive) Discharge Coordination 0 PROCESS - Special Needs _16  - Pediatric / Minor Patient Management 0 _17  - Isolation Patient Management 0 _18  - Hearing / Language / Visual special needs 0 _19  - Assessment of Community assistance (transportation, D/C planning, etc.) 0 _20  - Additional assistance / Altered mentation 0 _21  - Support Surface(s) Assessment (bed, cushion, seat,  etc.) 0 INTERVENTIONS - Wound Cleansing / Measurement _22  - Simple Wound Cleansing - one wound 0 X - Complex Wound Cleansing - multiple wounds 2 5 X - Wound Imaging (photographs -  any number of wounds) 1 5 _0  - Wound Tracing (instead of photographs) 0 _1  - Simple Wound Measurement - one wound 0 X - Complex Wound Measurement - multiple wounds 2 5 INTERVENTIONS - Wound Dressings X - Small Wound Dressing one or multiple wounds 2 10 _2  - Medium Wound Dressing one or multiple wounds 0 _3  - Large Wound Dressing one or multiple wounds 0 <ZDGUYQIHKVQQVZDG>_3<\/OVFIEPPIRJJOACZY>_6  - Application of Medications - topical 0 <AYTKZSWFUXNATFTD>_3<\/UKGURKYHCWCBJSEG>_3  - Application of Medications - injection 0 INTERVENTIONS - Miscellaneous _6  - External ear exam 0 Natasha [name], Natasha [name] ([id number]) _7  - Specimen Collection (cultures, biopsies, blood, body fluids, etc.) 0 _8  - Specimen(s) / Culture(s) sent or taken to Lab for analysis 0 _9  - Patient Transfer (multiple staff / Harrel Lemon Lift / Similar devices) 0 _10  - Simple Staple / Suture removal (25 or less) 0 _11  - Complex Staple / Suture removal (26 or more) 0 _12  - Hypo / Hyperglycemic Management (close monitor of Blood Glucose) 0 _13  - Ankle / Brachial Index (ABI) - do not check if billed separately 0 X - Vital Signs 1 5 Has the patient been seen at the hospital within the last three years: Yes Total Score: 105 Level Of Care: New/Established - Level 3 Electronic Signature(s) Signed: 04/12/2016 4:56:18 PM By: Montey Hora Entered By: Montey Hora on 04/12/2016 15:12:37 Natasha [name] ([id number]) -------------------------------------------------------------------------------- Encounter Discharge Information Details Patient Name: Natasha [name] Date of Service: 04/12/2016 2:30 PM Medical Record Number: [id number] Patient Account Number: [id number] Date of Birth/Sex: Oct 21, 1928 (83 y.o. Female) Treating RN: Montey Hora Primary Care Noelene Gang: Emily Filbert Other Clinician: Referring Antionetta Ator: Emily Filbert Treating Lyric Hoar/Extender: Frann Rider in Treatment: 12 Encounter Discharge Information Items Discharge Pain Level: 0 Discharge Condition: Stable Ambulatory Status: Walker Discharge Destination: Home Private Transportation: Auto Accompanied By: spouse Schedule Follow-up Appointment: Yes Medication Reconciliation completed and No provided to Patient/Care Bryn Saline: Patient Clinical Summary of Care: Declined Electronic Signature(s) Signed: 04/12/2016 3:06:27 PM By: Ruthine Dose Entered By: Ruthine Dose on 04/12/2016 15:06:26 Natasha [name] ([id number]) -------------------------------------------------------------------------------- Lower Extremity Assessment Details Patient Name: Natasha [name] Date of Service: 04/12/2016 2:30 PM Medical Record Number: [id number] Patient Account Number: [id number] Date of Birth/Sex: 12-19-1928 (83 y.o. Female) Treating RN: Montey Hora Primary Care Merik Mignano: Emily Filbert Other Clinician: Referring Laneta Guerin: Emily Filbert Treating Zonie Crutcher/Extender: Frann Rider in Treatment: 12 Vascular Assessment Pulses: Dorsalis Pedis Palpable: [Left:Yes] [Right:Yes] Posterior Tibial Extremity colors, hair growth, and conditions: Extremity Color: [Left:Hyperpigmented] Hair Growth on Extremity: [Left:Yes] Temperature of Extremity: [Left:Warm] Capillary Refill: [Left:< 3 seconds] Electronic Signature(s) Signed: 04/12/2016 4:56:18 PM By: Montey Hora Entered By: Montey Hora on 04/12/2016 14:44:37 Natasha [name] ([id number]) -------------------------------------------------------------------------------- Multi Wound Chart Details Patient Name: Natasha [name] Date of Service: 04/12/2016 2:30 PM Medical Record Number: [id number] Patient Account Number: [id number] Date of Birth/Sex: 02/10/1928 (83 y.o. Female) Treating RN: Montey Hora Primary Care Bart Ashford: Emily Filbert Other Clinician: Referring  Jeffifer Rabold: Emily Filbert Treating Clearnce Leja/Extender: Frann Rider in Treatment: 12 Vital Signs Height(in): 59 Pulse(bpm): 84 Weight(lbs): 140 Blood Pressure 135/51 (mmHg): Body Mass Index(BMI): 28 Temperature(F): 98.5 Respiratory Rate 18 (breaths/min): Photos: [1:No Photos] [3:No Photos] [N/A:N/A] Wound Location: [1:Left Upper Leg - Medial Left Lower Leg - Distal] [N/A:N/A] Wounding Event: [1:Surgical Injury] [3:Surgical Injury] [N/A:N/A] Primary Etiology: [1:Open Surgical Wound] [3:Open Surgical Wound] [N/A:N/A] Comorbid History: [1:Anemia, Asthma, Hypertension, Peripheral Hypertension, Peripheral Venous Disease, Osteoarthritis, Neuropathy Osteoarthritis, Neuropathy] [3:Anemia, Asthma, Venous Disease,] [N/A:N/A] Date Acquired: [1:12/04/2015] [3:12/04/2015] [N/A:N/A] Weeks of Treatment: [1:12] [3:12] [N/A:N/A] Wound  Status: [1:Open] [3:Open] [N/A:N/A] Measurements L x W x D 0.3x0.2x0.8 [3:1.2x0.3x0.2] [N/A:N/A] (cm) Area (cm) : [1:0.047] [3:0.283] [N/A:N/A] Volume (cm) : [1:0.038] [3:0.057] [N/A:N/A] % Reduction in Area: [1:95.00%] [3:98.40%] [N/A:N/A] % Reduction in Volume: 97.10% [3:99.50%] [N/A:N/A] Classification: [1:Full Thickness With Exposed Support Structures] [3:Full Thickness With Exposed Support Structures] [N/A:N/A] Exudate Amount: [1:Medium] [3:Large] [N/A:N/A] Exudate Type: [1:Serosanguineous] [3:Serosanguineous] [N/A:N/A] Exudate Color: [1:red, brown] [3:red, brown] [N/A:N/A] Wound Margin: [1:Epibole] [3:Epibole] [N/A:N/A] Granulation Amount: [1:Large (67-100%)] [3:Large (67-100%)] [N/A:N/A] Granulation Quality: [1:Red, Pink] [3:Red, Pink, Hyper- granulation] [N/A:N/A] Necrotic Amount: [1:Small (1-33%)] [3:Small (1-33%)] [N/A:N/A] Exposed Structures: [1:Fat Layer (Subcutaneous Fat Layer (Subcutaneous N/A Tissue) Exposed: Yes] [3:Tissue) Exposed: Yes] Fascia: No Fascia: No Tendon: No Tendon: No Muscle: No Muscle: No Joint: No Joint: No Bone:  No Bone: No Epithelialization: Small (1-33%) Small (1-33%) N/A Periwound Skin Texture: Scarring: Yes Scarring: Yes N/A Excoriation: No Induration: No Callus: No Crepitus: No Rash: No Periwound Skin No Abnormalities Noted Maceration: No N/A Moisture: Dry/Scaly: No Periwound Skin Color: Erythema: No Atrophie Blanche: No N/A Cyanosis: No Ecchymosis: No Erythema: No Hemosiderin Staining: No Mottled: No Pallor: No Rubor: No Temperature: No Abnormality No Abnormality N/A Tenderness on Yes Yes N/A Palpation: Wound Preparation: Ulcer Cleansing: Ulcer Cleansing: N/A Rinsed/Irrigated with Rinsed/Irrigated with Saline Saline Topical Anesthetic Topical Anesthetic Applied: Other: lidocaine Applied: Other: lidocaine 4% 4% Treatment Notes Electronic Signature(s) Signed: 04/12/2016 3:06:16 PM By: Christin Fudge MD, FACS Entered By: Christin Fudge on 04/12/2016 15:06:15 Natasha [name] ([id number]) -------------------------------------------------------------------------------- [place] Details Patient Name: Natasha [name] Date of Service: 04/12/2016 2:30 PM Medical Record Number: [id number] Patient Account Number: [id number] Date of Birth/Sex: Feb 28, 1928 (83 y.o. Female) Treating RN: Montey Hora Primary Care Hassaan Crite: Emily Filbert Other Clinician: Referring Myca Perno: Emily Filbert Treating Arhan Mcmanamon/Extender: Frann Rider in Treatment: 12 Active Inactive ` Abuse / Safety / Falls / Self Care Management Nursing Diagnoses: Potential for falls Goals: Patient will remain injury free Date Initiated: 01/19/2016 Target Resolution Date: 05/25/2016 Goal Status: Active Interventions: Assess fall risk on admission and as needed Assess: immobility, friction, shearing, incontinence upon admission and as needed Assess impairment of mobility on admission and as needed per policy Notes: ` Nutrition Nursing Diagnoses: Imbalanced  nutrition Goals: Patient/caregiver agrees to and verbalizes understanding of need to use nutritional supplements and/or vitamins as prescribed Date Initiated: 01/19/2016 Target Resolution Date: 05/25/2016 Goal Status: Active Interventions: Assess patient nutrition upon admission and as needed per policy Notes: ` Orientation to the [place] [place]. ([id number]) Nursing Diagnoses: Knowledge deficit related to the wound healing center program Goals: Patient/caregiver will verbalize understanding of the [facility] Date Initiated: 01/19/2016 Target Resolution Date: 05/25/2016 Goal Status: Active Interventions: Provide education on orientation to the wound center Notes: ` Pain, Acute or Chronic Nursing Diagnoses: Pain, acute or chronic: actual or potential Potential alteration in comfort, pain Goals: Patient will verbalize adequate pain control and receive pain control interventions during procedures as needed Date Initiated: 01/19/2016 Target Resolution Date: 05/25/2016 Goal Status: Active Patient/caregiver will verbalize adequate pain control between visits Date Initiated: 01/19/2016 Target Resolution Date: 05/25/2016 Goal Status: Active Patient/caregiver will verbalize comfort level met Date Initiated: 01/19/2016 Target Resolution Date: 05/25/2016 Goal Status: Active Interventions: Assess comfort goal upon admission Complete pain assessment as per visit requirements Notes: ` Wound/Skin Impairment Nursing Diagnoses: Impaired tissue integrity Knowledge deficit related to ulceration/compromised skin integrity Goals: Natasha [name], Natasha [name] ([id number]) Ulcer/skin breakdown will have a volume reduction of 30% by week 4 Date Initiated: 01/19/2016  Target Resolution Date: 05/25/2016 Goal Status: Active Ulcer/skin breakdown will have a volume reduction of 50% by week 8 Date Initiated: 01/19/2016 Target Resolution Date: 05/25/2016 Goal Status:  Active Ulcer/skin breakdown will have a volume reduction of 80% by week 12 Date Initiated: 01/19/2016 Target Resolution Date: 05/25/2016 Goal Status: Active Interventions: Assess patient/caregiver ability to perform ulcer/skin care regimen upon admission and as needed Assess ulceration(s) every visit Notes: Electronic Signature(s) Signed: 04/12/2016 4:56:18 PM By: Montey Hora Entered By: Montey Hora on 04/12/2016 14:44:48 Natasha [name] ([id number]) -------------------------------------------------------------------------------- Pain Assessment Details Patient Name: Natasha [name] Date of Service: 04/12/2016 2:30 PM Medical Record Number: [id number] Patient Account Number: [id number] Date of Birth/Sex: [birth year] (83 y.o. Female) Treating RN: Montey Hora Primary Care Waino Mounsey: Emily Filbert Other Clinician: Referring Zaylin Runco: Emily Filbert Treating Blaike Newburn/Extender: Frann Rider in Treatment: 12 Active Problems Location of Pain Severity and Description of Pain Patient Has Paino No Site Locations Pain Management and Medication Current Pain Management: Notes Topical or injectable lidocaine is offered to patient for acute pain when surgical debridement is performed. If needed, Patient is instructed to use over the counter pain medication for the following 24-48 hours after debridement. Wound care MDs do not prescribed pain medications. Patient has chronic pain or uncontrolled pain. Patient has been instructed to make an appointment with their Primary Care Physician for pain management. Electronic Signature(s) Signed: 04/12/2016 4:56:18 PM By: Montey Hora Entered By: Montey Hora on 04/12/2016 14:33:15 Natasha [name] ([id number]) -------------------------------------------------------------------------------- Patient/Caregiver Education Details Patient Name: Natasha [name] Date of Service: 04/12/2016 2:30 PM Medical Record Number:  [id number] Patient Account Number: [id number] Date of Birth/Gender: [birth year] (83 y.o. Female) Treating RN: Montey Hora Primary Care Physician: Emily Filbert Other Clinician: Referring Physician: Emily Filbert Treating Physician/Extender: Frann Rider in Treatment: 12 Education Assessment Education Provided To: Patient and Caregiver Education Topics Provided Wound/Skin Impairment: Handouts: Other: wound care as ordered Methods: Explain/Verbal Responses: State content correctly Electronic Signature(s) Signed: 04/12/2016 4:56:18 PM By: Montey Hora Entered By: Montey Hora on 04/12/2016 14:46:37 Natasha [name] ([id number]) -------------------------------------------------------------------------------- Wound Assessment Details Patient Name: Natasha [name] Date of Service: 04/12/2016 2:30 PM Medical Record Number: [id number] Patient Account Number: [id number] Date of Birth/Sex: 05/06/1928 (83 y.o. Female) Treating RN: Montey Hora Primary Care Shanitha Twining: Emily Filbert Other Clinician: Referring Ryenn Howeth: Emily Filbert Treating Donovan Persley/Extender: Frann Rider in Treatment: 12 Wound Status Wound Number: 1 Primary Open Surgical Wound Etiology: Wound Location: Left Upper Leg - Medial Wound Open Wounding Event: Surgical Injury Status: Date Acquired: 12/04/2015 Comorbid Anemia, Asthma, Hypertension, Weeks Of Treatment: 12 History: Peripheral Venous Disease, Clustered Wound: No Osteoarthritis, Neuropathy Photos Photo Uploaded By: Montey Hora on 04/12/2016 16:28:57 Wound Measurements Length: (cm) 0.3 Width: (cm) 0.2 Depth: (cm) 0.8 Area: (cm) 0.047 Volume: (cm) 0.038 % Reduction in Area: 95% % Reduction in Volume: 97.1% Epithelialization: Small (1-33%) Tunneling: No Undermining: No Wound Description Full Thickness With Exposed Classification: Support Structures Wound Margin: Epibole Exudate Medium Amount: Exudate Type:  Serosanguineous Exudate Color: red, brown Foul Odor After Cleansing: No Slough/Fibrino Yes Wound Bed Granulation Amount: Large (67-100%) Exposed Structure Granulation Quality: Red, Pink Fascia Exposed: No Natasha [name], Natasha [name] ([id number]) Necrotic Amount: Small (1-33%) Fat Layer (Subcutaneous Tissue) Exposed: Yes Necrotic Quality: Adherent Slough Tendon Exposed: No Muscle Exposed: No Joint Exposed: No Bone Exposed: No Periwound Skin Texture Texture Color No Abnormalities Noted: No No Abnormalities Noted: No Scarring: Yes Erythema: No Moisture Temperature / Pain No Abnormalities Noted: No Temperature: No Abnormality Tenderness on Palpation: Yes  Wound Preparation Ulcer Cleansing: Rinsed/Irrigated with Saline Topical Anesthetic Applied: Other: lidocaine 4%, Treatment Notes Wound #1 (Left, Medial Upper Leg) 1. Cleansed with: Clean wound with Normal Saline 2. Anesthetic Topical Lidocaine 4% cream to wound bed prior to debridement 4. Dressing Applied: Iodoform packing Gauze 5. Secondary Dressing Applied ABD Pad Kerlix/Conform 7. Secured with Recruitment consultant) Signed: 04/12/2016 4:56:18 PM By: Montey Hora Entered By: Montey Hora on 04/12/2016 14:40:58 Natasha [name] ([id number]) -------------------------------------------------------------------------------- Wound Assessment Details Patient Name: Natasha [name] Date of Service: 04/12/2016 2:30 PM Medical Record Number: [id number] Patient Account Number: [id number] Date of Birth/Sex: April 29, 1928 (83 y.o. Female) Treating RN: Montey Hora Primary Care Vista Sawatzky: Emily Filbert Other Clinician: Referring Chenell Lozon: Emily Filbert Treating Dorita Rowlands/Extender: Frann Rider in Treatment: 12 Wound Status Wound Number: 3 Primary Open Surgical Wound Etiology: Wound Location: Left Lower Leg - Distal Wound Open Wounding Event: Surgical Injury Status: Date Acquired: 12/04/2015 Comorbid Anemia,  Asthma, Hypertension, Weeks Of Treatment: 12 History: Peripheral Venous Disease, Clustered Wound: No Osteoarthritis, Neuropathy Photos Photo Uploaded By: Montey Hora on 04/12/2016 16:28:57 Wound Measurements Length: (cm) 1.2 Width: (cm) 0.3 Depth: (cm) 0.2 Area: (cm) 0.283 Volume: (cm) 0.057 % Reduction in Area: 98.4% % Reduction in Volume: 99.5% Epithelialization: Small (1-33%) Tunneling: No Undermining: No Wound Description Full Thickness With Exposed Classification: Support Structures Wound Margin: Epibole Exudate Large Amount: Exudate Type: Serosanguineous Exudate Color: red, brown Foul Odor After Cleansing: No Slough/Fibrino Yes Wound Bed Granulation Amount: Large (67-100%) Exposed Structure Granulation Quality: Red, Pink, Hyper-granulation Fascia Exposed: No Natasha [name], Natasha [name] ([id number]) Necrotic Amount: Small (1-33%) Fat Layer (Subcutaneous Tissue) Exposed: Yes Necrotic Quality: Adherent Slough Tendon Exposed: No Muscle Exposed: No Joint Exposed: No Bone Exposed: No Periwound Skin Texture Texture Color No Abnormalities Noted: No No Abnormalities Noted: No Callus: No Atrophie Blanche: No Crepitus: No Cyanosis: No Excoriation: No Ecchymosis: No Induration: No Erythema: No Rash: No Hemosiderin Staining: No Scarring: Yes Mottled: No Pallor: No Moisture Rubor: No No Abnormalities Noted: No Dry / Scaly: No Temperature / Pain Maceration: No Temperature: No Abnormality Tenderness on Palpation: Yes Wound Preparation Ulcer Cleansing: Rinsed/Irrigated with Saline Topical Anesthetic Applied: Other: lidocaine 4%, Treatment Notes Wound #3 (Left, Distal Lower Leg) 1. Cleansed with: Clean wound with Normal Saline 2. Anesthetic Topical Lidocaine 4% cream to wound bed prior to debridement 4. Dressing Applied: Prisma Ag 5. Secondary Dressing Applied Guaze, ABD and kerlix/Conform 7. Secured with Recruitment consultant) Signed:  04/12/2016 4:56:18 PM By: Montey Hora Entered By: Montey Hora on 04/12/2016 14:41:12 Natasha [name] ([id number]) -------------------------------------------------------------------------------- Vitals Details Patient Name: Natasha [name] Date of Service: 04/12/2016 2:30 PM Medical Record Number: [id number] Patient Account Number: [id number] Date of Birth/Sex: 16-Jan-1928 (83 y.o. Female) Treating RN: Montey Hora Primary Care Audley Hinojos: Emily Filbert Other Clinician: Referring Yolette Hastings: Emily Filbert Treating Kiah Keay/Extender: Frann Rider in Treatment: 12 Vital Signs Time Taken: 14:33 Temperature (F): 98.5 Height (in): 59 Pulse (bpm): 84 Weight (lbs): 140 Respiratory Rate (breaths/min): 18 Body Mass Index (BMI): 28.3 Blood Pressure (mmHg): 135/51 Reference Range: 80 - 120 mg / dl Electronic Signature(s) Signed: 04/12/2016 4:56:18 PM By: Montey Hora Entered By: Montey Hora on 04/12/2016 14:33:34

## 2016-04-19 ENCOUNTER — Encounter: Payer: Medicare Other | Admitting: Surgery

## 2016-04-19 DIAGNOSIS — I70242 Atherosclerosis 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ration of calf: Secondary | ICD-10-CM | POA: Diagnosis not present

## 2016-04-20 NOTE — Progress Notes (Signed)
RENNAE, FERRAIOLO ([id number]) Visit Report for 04/19/2016 Arrival Information Details Patient Name: Natasha [name], Natasha [name] Date of Service: 04/19/2016 3:30 PM Medical Record Number: [id number] Patient Account Number: [id number] Date of Birth/Sex: [birth year] (83 y.o. Female) Treating RN: Montey Hora Primary Care Bama Hanselman: Emily Filbert Other Clinician: Referring Amairany Schumpert: Emily Filbert Treating Kiet Geer/Extender: Frann Rider in Treatment: 13 Visit Information History Since Last Visit Added or deleted any medications: No Patient Arrived: Walker Any new allergies or adverse reactions: No Arrival Time: 15:33 Had a fall or experienced change in No Accompanied By: spouse activities of daily living that may affect Transfer Assistance: None risk of falls: Patient Identification Verified: Yes Signs or symptoms of abuse/neglect since last No Secondary Verification Process Yes visito Completed: Hospitalized since last visit: No Patient Requires Transmission- No Has Dressing in Place as Prescribed: Yes Based Precautions: Pain Present Now: No Patient Has Alerts: Yes Patient Alerts: Patient on Blood Thinner Plavix Electronic Signature(s) Signed: 04/19/2016 4:50:37 PM By: Montey Hora Entered By: Montey Hora on 04/19/2016 15:33:59 Natasha [name] ([id number]) -------------------------------------------------------------------------------- Encounter Discharge Information Details Patient Name: Natasha [name] Date of Service: 04/19/2016 3:30 PM Medical Record Number: [id number] Patient Account Number: [id number] Date of Birth/Sex: 12-08-1928 (83 y.o. Female) Treating RN: Montey Hora Primary Care Janique Hoefer: Emily Filbert Other Clinician: Referring Kymere Fullington: Emily Filbert Treating Madinah Quarry/Extender: Frann Rider in Treatment: 13 Encounter Discharge Information Items Discharge Pain Level: 0 Discharge Condition: Stable Ambulatory Status: Walker Discharge  Destination: Home Transportation: Private Auto Accompanied By: spouse Schedule Follow-up Appointment: Yes Medication Reconciliation completed No and provided to Patient/Care Anyla Israelson: Provided on Clinical Summary of Care: 04/19/2016 Form Type Recipient Paper Patient [facility] Electronic Signature(s) Signed: 04/19/2016 4:17:38 PM By: Montey Hora Previous Signature: 04/19/2016 4:03:47 PM Version By: Ruthine Dose Entered By: Montey Hora on 04/19/2016 16:17:38 Natasha [name] ([id number]) -------------------------------------------------------------------------------- Lower Extremity Assessment Details Patient Name: Natasha [name] Date of Service: 04/19/2016 3:30 PM Medical Record Number: [id number] Patient Account Number: [id number] Date of Birth/Sex: 1928-05-18 (83 y.o. Female) Treating RN: Montey Hora Primary Care Viki Carrera: Emily Filbert Other Clinician: Referring Nhyira Leano: Emily Filbert Treating Nykayla Marcelli/Extender: Frann Rider in Treatment: 13 Vascular Assessment Pulses: Dorsalis Pedis Palpable: [Left:Yes] Posterior Tibial Extremity colors, hair growth, and conditions: Extremity Color: [Left:Hyperpigmented] Hair Growth on Extremity: [Left:Yes] Temperature of Extremity: [Left:Warm] Capillary Refill: [Left:< 3 seconds] Electronic Signature(s) Signed: 04/19/2016 4:50:37 PM By: Montey Hora Entered By: Montey Hora on 04/19/2016 15:54:21 Natasha [name] ([id number]) -------------------------------------------------------------------------------- Multi Wound Chart Details Patient Name: Natasha [name] Date of Service: 04/19/2016 3:30 PM Medical Record Number: [id number] Patient Account Number: [id number] Date of Birth/Sex: 1928-11-06 (83 y.o. Female) Treating RN: Montey Hora Primary Care Almee Pelphrey: Emily Filbert Other Clinician: Referring Trinika Cortese: Emily Filbert Treating Ninel Abdella/Extender: Frann Rider in Treatment: 13 Vital  Signs Height(in): 59 Pulse(bpm): 79 Weight(lbs): 140 Blood Pressure 137/52 (mmHg): Body Mass Index(BMI): 28 Temperature(F): 98.2 Respiratory Rate 18 (breaths/min): Photos: [1:No Photos] [3:No Photos] [N/A:N/A] Wound Location: [1:Left Upper Leg - Medial Left Lower Leg - Distal] [N/A:N/A] Wounding Event: [1:Surgical Injury] [3:Surgical Injury] [N/A:N/A] Primary Etiology: [1:Open Surgical Wound] [3:Open Surgical Wound] [N/A:N/A] Comorbid History: [1:Anemia, Asthma, Hypertension, Peripheral Hypertension, Peripheral Venous Disease, Osteoarthritis, Neuropathy Osteoarthritis, Neuropathy] [3:Anemia, Asthma, Venous Disease,] [N/A:N/A] Date Acquired: [1:12/04/2015] [3:12/04/2015] [N/A:N/A] Weeks of Treatment: [1:13] [3:13] [N/A:N/A] Wound Status: [1:Open] [3:Open] [N/A:N/A] Measurements L x W x D 0.2x0.1x0.5 [3:1x0.2x0.1] [N/A:N/A] (cm) Area (cm) : [1:0.016] [3:0.157] [N/A:N/A] Volume (cm) : [1:0.008] [3:0.016] [N/A:N/A] % Reduction in Area: [1:98.30%] [3:99.10%] [N/A:N/A] % Reduction in Volume: 99.40% [  3:99.90%] [N/A:N/A] Classification: [1:Full Thickness With Exposed Support Structures] [3:Full Thickness With Exposed Support Structures] [N/A:N/A] Exudate Amount: [1:Medium] [3:Large] [N/A:N/A] Exudate Type: [1:Serosanguineous] [3:Serosanguineous] [N/A:N/A] Exudate Color: [1:red, brown] [3:red, brown] [N/A:N/A] Wound Margin: [1:Epibole] [3:Epibole] [N/A:N/A] Granulation Amount: [1:Large (67-100%)] [3:Large (67-100%)] [N/A:N/A] Granulation Quality: [1:Red, Pink] [3:Red, Pink, Hyper- granulation] [N/A:N/A] Necrotic Amount: [1:Small (1-33%)] [3:Small (1-33%)] [N/A:N/A] Exposed Structures: [1:Fat Layer (Subcutaneous Fat Layer (Subcutaneous N/A Tissue) Exposed: Yes] [3:Tissue) Exposed: Yes] Fascia: No Fascia: No Tendon: No Tendon: No Muscle: No Muscle: No Joint: No Joint: No Bone: No Bone: No Epithelialization: Small (1-33%) Small (1-33%) N/A Debridement: N/A Open  Wound/Selective N/A ([id number]) - Selective Pre-procedure N/A 15:52 N/A Verification/Time Out Taken: Pain Control: N/A Lidocaine 4% Topical N/A Solution Tissue Debrided: N/A Exudates N/A Level: N/A Non-Viable Tissue N/A Debridement Area (sq N/A 0.2 N/A cm): Instrument: N/A Forceps N/A Bleeding: N/A None N/A Procedural Pain: N/A 0 N/A Post Procedural Pain: N/A 0 N/A Debridement Treatment N/A Procedure was tolerated N/A Response: well Post Debridement N/A 1x0.2x0.1 N/A Measurements L x W x D (cm) Post Debridement N/A 0.016 N/A Volume: (cm) Periwound Skin Texture: Scarring: Yes Scarring: Yes N/A Excoriation: No Induration: No Callus: No Crepitus: No Rash: No Periwound Skin No Abnormalities Noted Maceration: No N/A Moisture: Dry/Scaly: No Periwound Skin Color: Erythema: No Atrophie Blanche: No N/A Cyanosis: No Ecchymosis: No Erythema: No Hemosiderin Staining: No Mottled: No Pallor: No Rubor: No Temperature: No Abnormality No Abnormality N/A Tenderness on Yes Yes N/A Palpation: Wound Preparation: Ulcer Cleansing: Ulcer Cleansing: N/A Rinsed/Irrigated with Rinsed/Irrigated with Saline Saline Natasha [name], Natasha [name] ([id number]) Topical Anesthetic Topical Anesthetic Applied: Other: lidocaine Applied: Other: lidocaine 4% 4% Procedures Performed: N/A Debridement N/A Treatment Notes Electronic Signature(s) Signed: 04/19/2016 4:00:26 PM By: Christin Fudge MD, FACS Entered By: Christin Fudge on 04/19/2016 16:00:25 Natasha [name] ([id number]) -------------------------------------------------------------------------------- [place] Details Patient Name: Natasha [name] Date of Service: 04/19/2016 3:30 PM Medical Record Number: [id number] Patient Account Number: [id number] Date of Birth/Sex: 1928/07/09 (83 y.o. Female) Treating RN: Montey Hora Primary Care Taralynn Quiett: Emily Filbert Other Clinician: Referring Inri Sobieski: Emily Filbert Treating Maelys Kinnick/Extender: Frann Rider in Treatment: 13 Active Inactive ` Abuse / Safety / Falls / Self Care Management Nursing Diagnoses: Potential for falls Goals: Patient will remain injury free Date Initiated: 01/19/2016 Target Resolution Date: 05/25/2016 Goal Status: Active Interventions: Assess fall risk on admission and as needed Assess: immobility, friction, shearing, incontinence upon admission and as needed Assess impairment of mobility on admission and as needed per policy Notes: ` Nutrition Nursing Diagnoses: Imbalanced nutrition Goals: Patient/caregiver agrees to and verbalizes understanding of need to use nutritional supplements and/or vitamins as prescribed Date Initiated: 01/19/2016 Target Resolution Date: 05/25/2016 Goal Status: Active Interventions: Assess patient nutrition upon admission and as needed per policy Notes: ` Orientation to the [place]. ([id number]) Nursing Diagnoses: Knowledge deficit related to the wound healing center program Goals: Patient/caregiver will verbalize understanding of the [place] Date Initiated: 01/19/2016 Target Resolution Date: 05/25/2016 Goal Status: Active Interventions: Provide education on orientation to the wound center Notes: ` Pain, Acute or Chronic Nursing Diagnoses: Pain, acute or chronic: actual or potential Potential alteration in comfort, pain Goals: Patient will verbalize adequate pain control and receive pain control interventions during procedures as needed Date Initiated: 01/19/2016 Target Resolution Date: 05/25/2016 Goal Status: Active Patient/caregiver will verbalize adequate pain control between visits Date Initiated: 01/19/2016 Target Resolution Date: 05/25/2016 Goal Status: Active Patient/caregiver will verbalize comfort level met Date  Initiated: 01/19/2016 Target Resolution Date: 05/25/2016 Goal Status: Active Interventions: Assess  comfort goal upon admission Complete pain assessment as per visit requirements Notes: ` Wound/Skin Impairment Nursing Diagnoses: Impaired tissue integrity Knowledge deficit related to ulceration/compromised skin integrity Goals: Natasha [name], Natasha [name] ([id number]) Ulcer/skin breakdown will have a volume reduction of 30% by week 4 Date Initiated: 01/19/2016 Target Resolution Date: 05/25/2016 Goal Status: Active Ulcer/skin breakdown will have a volume reduction of 50% by week 8 Date Initiated: 01/19/2016 Target Resolution Date: 05/25/2016 Goal Status: Active Ulcer/skin breakdown will have a volume reduction of 80% by week 12 Date Initiated: 01/19/2016 Target Resolution Date: 05/25/2016 Goal Status: Active Interventions: Assess patient/caregiver ability to perform ulcer/skin care regimen upon admission and as needed Assess ulceration(s) every visit Notes: Electronic Signature(s) Signed: 04/19/2016 4:50:37 PM By: Montey Hora Entered By: Montey Hora on 04/19/2016 15:54:27 Natasha [name] ([id number]) -------------------------------------------------------------------------------- Pain Assessment Details Patient Name: Natasha [name] Date of Service: 04/19/2016 3:30 PM Medical Record Number: [id number] Patient Account Number: [id number] Date of Birth/Sex: Aug 09, 1928 (83 y.o. Female) Treating RN: Montey Hora Primary Care Khira Cudmore: Emily Filbert Other Clinician: Referring Lorinda Copland: Emily Filbert Treating Naly Schwanz/Extender: Frann Rider in Treatment: 13 Active Problems Location of Pain Severity and Description of Pain Patient Has Paino No Site Locations Pain Management and Medication Current Pain Management: Notes Topical or injectable lidocaine is offered to patient for acute pain when surgical debridement is performed. If needed, Patient is instructed to use over the counter pain medication for the following 24-48 hours after debridement. Wound care MDs do not  prescribed pain medications. Patient has chronic pain or uncontrolled pain. Patient has been instructed to make an appointment with their Primary Care Physician for pain management. Electronic Signature(s) Signed: 04/19/2016 4:50:37 PM By: Montey Hora Entered By: Montey Hora on 04/19/2016 15:34:19 Natasha [name] ([id number]) -------------------------------------------------------------------------------- Patient/Caregiver Education Details Patient Name: Natasha [name] Date of Service: 04/19/2016 3:30 PM Medical Record Number: [id number] Patient Account Number: [id number] Date of Birth/Gender: 1928/03/06 (83 y.o. Female) Treating RN: Montey Hora Primary Care Physician: Emily Filbert Other Clinician: Referring Physician: Emily Filbert Treating Physician/Extender: Frann Rider in Treatment: 13 Education Assessment Education Provided To: Patient and Caregiver Education Topics Provided Wound/Skin Impairment: Handouts: Other: wound care as ordered Methods: Explain/Verbal Responses: State content correctly Electronic Signature(s) Signed: 04/19/2016 4:50:37 PM By: Montey Hora Entered By: Montey Hora on 04/19/2016 16:17:56 Natasha [name] ([id number]) -------------------------------------------------------------------------------- Wound Assessment Details Patient Name: Natasha [name] Date of Service: 04/19/2016 3:30 PM Medical Record Number: [id number] Patient Account Number: [id number] Date of Birth/Sex: 10-Apr-1928 (83 y.o. Female) Treating RN: Montey Hora Primary Care Kerline Trahan: Emily Filbert Other Clinician: Referring Tylicia Sherman: Emily Filbert Treating Wrangler Penning/Extender: Frann Rider in Treatment: 13 Wound Status Wound Number: 1 Primary Open Surgical Wound Etiology: Wound Location: Left Upper Leg - Medial Wound Open Wounding Event: Surgical Injury Status: Date Acquired: 12/04/2015 Comorbid Anemia, Asthma, Hypertension, Weeks Of  Treatment: 13 History: Peripheral Venous Disease, Clustered Wound: No Osteoarthritis, Neuropathy Photos Photo Uploaded By: Montey Hora on 04/19/2016 16:51:36 Wound Measurements Length: (cm) 0.2 Width: (cm) 0.1 Depth: (cm) 0.5 Area: (cm) 0.016 Volume: (cm) 0.008 % Reduction in Area: 98.3% % Reduction in Volume: 99.4% Epithelialization: Small (1-33%) Tunneling: No Undermining: No Wound Description Full Thickness With Exposed Classification: Support Structures Wound Margin: Epibole Exudate Medium Amount: Exudate Type: Serosanguineous Exudate Color: red, brown Foul Odor After Cleansing: No Slough/Fibrino Yes Wound Bed Granulation Amount: Large (67-100%) Exposed Structure Granulation Quality: Red, Pink Fascia Exposed: No Natasha [name], LYKINS. ([id number])  Necrotic Amount: Small (1-33%) Fat Layer (Subcutaneous Tissue) Exposed: Yes Necrotic Quality: Adherent Slough Tendon Exposed: No Muscle Exposed: No Joint Exposed: No Bone Exposed: No Periwound Skin Texture Texture Color No Abnormalities Noted: No No Abnormalities Noted: No Scarring: Yes Erythema: No Moisture Temperature / Pain No Abnormalities Noted: No Temperature: No Abnormality Tenderness on Palpation: Yes Wound Preparation Ulcer Cleansing: Rinsed/Irrigated with Saline Topical Anesthetic Applied: Other: lidocaine 4%, Treatment Notes Wound #1 (Left, Medial Upper Leg) 1. Cleansed with: Clean wound with Normal Saline 4. Dressing Applied: Iodoform packing Gauze 5. Secondary Dressing Applied Bordered Foam Dressing Electronic Signature(s) Signed: 04/19/2016 4:50:37 PM By: Montey Hora Entered By: Montey Hora on 04/19/2016 15:52:15 Natasha [name] ([id number]) -------------------------------------------------------------------------------- Wound Assessment Details Patient Name: Natasha [name] Date of Service: 04/19/2016 3:30 PM Medical Record Number: [id number] Patient Account Number:  [id number] Date of Birth/Sex: 1928/08/20 (83 y.o. Female) Treating RN: Montey Hora Primary Care Raef Sprigg: Emily Filbert Other Clinician: Referring Rasheena Talmadge: Emily Filbert Treating Elisabetta Mishra/Extender: Frann Rider in Treatment: 13 Wound Status Wound Number: 3 Primary Open Surgical Wound Etiology: Wound Location: Left Lower Leg - Distal Wound Open Wounding Event: Surgical Injury Status: Date Acquired: 12/04/2015 Comorbid Anemia, Asthma, Hypertension, Weeks Of Treatment: 13 History: Peripheral Venous Disease, Clustered Wound: No Osteoarthritis, Neuropathy Photos Photo Uploaded By: Montey Hora on 04/19/2016 16:51:37 Wound Measurements Length: (cm) 1 Width: (cm) 0.2 Depth: (cm) 0.1 Area: (cm) 0.157 Volume: (cm) 0.016 % Reduction in Area: 99.1% % Reduction in Volume: 99.9% Epithelialization: Small (1-33%) Tunneling: No Undermining: No Wound Description Full Thickness With Exposed Classification: Support Structures Wound Margin: Epibole Exudate Large Amount: Exudate Type: Serosanguineous Exudate Color: red, brown Foul Odor After Cleansing: No Slough/Fibrino Yes Wound Bed Granulation Amount: Large (67-100%) Exposed Structure Granulation Quality: Red, Pink, Hyper-granulation Fascia Exposed: No Natasha [name], Natasha [name] ([id number]) Necrotic Amount: Small (1-33%) Fat Layer (Subcutaneous Tissue) Exposed: Yes Necrotic Quality: Adherent Slough Tendon Exposed: No Muscle Exposed: No Joint Exposed: No Bone Exposed: No Periwound Skin Texture Texture Color No Abnormalities Noted: No No Abnormalities Noted: No Callus: No Atrophie Blanche: No Crepitus: No Cyanosis: No Excoriation: No Ecchymosis: No Induration: No Erythema: No Rash: No Hemosiderin Staining: No Scarring: Yes Mottled: No Pallor: No Moisture Rubor: No No Abnormalities Noted: No Dry / Scaly: No Temperature / Pain Maceration: No Temperature: No Abnormality Tenderness on Palpation:  Yes Wound Preparation Ulcer Cleansing: Rinsed/Irrigated with Saline Topical Anesthetic Applied: Other: lidocaine 4%, Treatment Notes Wound #3 (Left, Distal Lower Leg) 1. Cleansed with: Clean wound with Normal Saline 2. Anesthetic Topical Lidocaine 4% cream to wound bed prior to debridement 5. Secondary Dressing Applied Bordered Foam Dressing Electronic Signature(s) Signed: 04/19/2016 4:50:37 PM By: Montey Hora Entered By: Montey Hora on 04/19/2016 15:53:47 Natasha [name] ([id number]) -------------------------------------------------------------------------------- Vitals Details Patient Name: Natasha [name] Date of Service: 04/19/2016 3:30 PM Medical Record Number: [id number] Patient Account Number: [id number] Date of Birth/Sex: October 25, 1928 (83 y.o. Female) Treating RN: Montey Hora Primary Care Alroy Portela: Emily Filbert Other Clinician: Referring Passion Lavin: Emily Filbert Treating Trayquan Kolakowski/Extender: Frann Rider in Treatment: 13 Vital Signs Time Taken: 15:35 Temperature (F): 98.2 Height (in): 59 Pulse (bpm): 79 Weight (lbs): 140 Respiratory Rate (breaths/min): 18 Body Mass Index (BMI): 28.3 Blood Pressure (mmHg): 137/52 Reference Range: 80 - 120 mg / dl Electronic Signature(s) Signed: 04/19/2016 4:50:37 PM By: Montey Hora Entered By: Montey Hora on 04/19/2016 15:35:58

## 2016-04-20 NOTE — Progress Notes (Signed)
[name], [name] ([id number]) Visit Report for 04/19/2016 Chief Complaint Document Details Patient Name: Natasha [name], Natasha [name] Date of Service: 04/19/2016 3:30 PM Medical Record Number: [id number] Patient Account Number: [id number] Date of Birth/Sex: 1928/05/23 (83 y.o. Female) Treating RN: Montey Hora Primary Care Provider: Emily Filbert Other Clinician: Referring Provider: Emily Filbert Treating Provider/Extender: Frann Rider in Treatment: 13 Information Obtained from: Patient Chief Complaint Patient presents to the wound care center today with an open arterial ulcer along with diabetes mellitus to the left lower extremity which she's had for over a month Electronic Signature(s) Signed: 04/19/2016 4:04:14 PM By: Christin Fudge MD, FACS Entered By: Christin Fudge on 04/19/2016 16:04:14 Natasha [name] ([id number]) -------------------------------------------------------------------------------- Debridement Details Patient Name: Natasha [name] Date of Service: 04/19/2016 3:30 PM Medical Record Number: [id number] Patient Account Number: [id number] Date of Birth/Sex: [birth year] (83 y.o. Female) Treating RN: Montey Hora Primary Care Provider: Emily Filbert Other Clinician: Referring Provider: Emily Filbert Treating Provider/Extender: Frann Rider in Treatment: 13 Debridement Performed for Wound #3 Left,Distal Lower Leg Assessment: Performed By: Physician Christin Fudge, MD Debridement: Open Wound/Selective Debridement Selective Description: Pre-procedure Yes - 15:52 Verification/Time Out Taken: Start Time: 15:52 Pain Control: Lidocaine 4% Topical Solution Level: Non-Viable Tissue Total Area Debrided (L x 1 (cm) x 0.2 (cm) = 0.2 (cm) W): Tissue and other Non-Viable, Exudate material debrided: Instrument: Forceps Bleeding: None End Time: 15:54 Procedural Pain: 0 Post Procedural Pain: 0 Response to Treatment: Procedure was tolerated well Post  Debridement Measurements of Total Wound Length: (cm) 1 Width: (cm) 0.2 Depth: (cm) 0.1 Volume: (cm) 0.016 Character of Wound/Ulcer Post Improved Debridement: Severity of Tissue Post Debridement: Limited to breakdown of skin Post Procedure Diagnosis Same as Pre-procedure Electronic Signature(s) Signed: 04/19/2016 4:04:08 PM By: Christin Fudge MD, FACS Signed: 04/19/2016 4:50:37 PM By: Montey Hora Entered By: Christin Fudge on 04/19/2016 16:04:07 Natasha [name] ([id number], Natasha [name] ([id number]) -------------------------------------------------------------------------------- HPI Details Patient Name: Natasha [name] Date of Service: 04/19/2016 3:30 PM Medical Record Number: [id number] Patient Account Number: [id number] Date of Birth/Sex: 13-Feb-1928 (83 y.o. Female) Treating RN: Montey Hora Primary Care Provider: Emily Filbert Other Clinician: Referring Provider: Emily Filbert Treating Provider/Extender: Frann Rider in Treatment: 13 History of Present Illness Location: left thigh and left calf in the area where there was previous vascular surgery Quality: Patient reports experiencing a dull pain to affected area(s). Severity: Patient states wound (s) are getting better. Duration: Patient has had the wound for > 2 months prior to seeking treatment at the wound center Timing: Pain in wound is Intermittent (comes and goes Context: The wound occurred when the patient had vascular surgery and the wounds were not healing very well Modifying Factors: Other treatment(s) tried include:local care and oral antibiotics Associated Signs and Symptoms: Patient reports having increase swelling. HPI Description: 83 year old female with significant past medical history of anemia, cellulitis and abscess of the leg, COPD, coronary artery disease, hypertension, osteoarthritis, peripheral vascular disease with claudication, trigeminal neuralgia and type 2 diabetes mellitus  without complications. A past surgical history significant for appendectomy, left femoral o peroneal bypass graft in October 2017, and debridement of skin and subcutis tissue on the left side in December 2017. She was treated by Dr. Lucky Cowboy earlier, with multiple left leg revascularization both endovascular and open. She is a former smoker who quit 30 years ago. When she was last seen at [facility], at the vascular surgery clinic she had a left lower extremity 3 separate wounds along the saphenectomy site. She was  placed on Bactrim DS for 10 days. She had a good signal over the peroneal artery and the bypass remains patent. Prior to surgery her left ABI was 0.46 and a right was 0.98. Note the patient was seen by Dr. Amalia Hailey of podiatry on 01/15/2016 and he saw for ulceration of her left great toe. Most recently the patient was seen by Dr. Maura Crandall, the vascular surgeon on January 5 and these notes are reviewed on Epic-- he noted that the 3 wounds on the left lower extremity are granulation very slowly, and he recommended packing with wet-to-dry Kerlix. The surgeon noted excellent signal over the peroneal artery and the recommendation was to see as at the wound center to help with healing. 02/12/2016 -- we have not seen her for 2 weeks and her husband tells me that she was admitted to the hospital with a pneumonia. I have reviewed a hospital notes, she was there between January 19 and January 22, with clinical sepsis and bilateral pneumonia, fever and leukocytosis and was treated with vancomycin and Zosyn. Vancomycin was then stopped and Zithromax was added. He was discharged home on Augmentin 02/23/2016 -- she had a arterial duplex study done recently at [facility] and her right ABI was 0.92 and the left ABI was 0.96. The femoral to peroneal bypass is widely patent. she was also seen by the surgeon who noted a postoperative seroma in the left groin. she was asked to return in 3 months for  duplex examination 03/01/16 -she has got a lot of lymphedema for left lower extremity and there is weeping from the lower lateral leg. She had been walking around a lot yesterday as she had gone shopping. Natasha [name], Natasha [name] ([id number]) 03/08/2016 -- and did used to have a lot of lymphedema for left lower extremity but does not have much pain. 04/05/2016 -- She was admitted last week between March 17 to March 21 with acute respiratory failure, community-acquired pneumonia, cellulitis of the right leg, right upper lobe pneumonia was seen on CT scan. Was treated with vancomycin and Zosyn at the time of admission and switched over to doxycycline upon discharge home. Xray of the right foot showed no fracture or dislocation and no soft tissue abnormality. Electronic Signature(s) Signed: 04/19/2016 4:04:19 PM By: Christin Fudge MD, FACS Entered By: Christin Fudge on 04/19/2016 16:04:19 Natasha [name] ([id number]) -------------------------------------------------------------------------------- Physical Exam Details Patient Name: Natasha [name] Date of Service: 04/19/2016 3:30 PM Medical Record Number: [id number] Patient Account Number: [id number] Date of Birth/Sex: 04/18/1928 (83 y.o. Female) Treating RN: Montey Hora Primary Care Provider: Emily Filbert Other Clinician: Referring Provider: Emily Filbert Treating Provider/Extender: Frann Rider in Treatment: 13 Constitutional . Pulse regular. Respirations normal and unlabored. Afebrile. . Eyes Nonicteric. Reactive to light. Ears, Nose, Mouth, and Throat Lips, teeth, and gums WNL.Marland Kitchen Moist mucosa without lesions. Neck supple and nontender. No palpable supraclavicular or cervical adenopathy. Normal sized without goiter. Respiratory WNL. No retractions.. Breath sounds WNL, No rubs, rales, rhonchi, or wheeze.. Cardiovascular Heart rhythm and rate regular, no murmur or gallop.. Pedal Pulses WNL. No clubbing, cyanosis or  edema. Chest Breasts symmetical and no nipple discharge.. Breast tissue WNL, no masses, lumps, or tenderness.. Lymphatic No adneopathy. No adenopathy. No adenopathy. Musculoskeletal Adexa without tenderness or enlargement.. Digits and nails w/o clubbing, cyanosis, infection, petechiae, ischemia, or inflammatory conditions.. Integumentary (Hair, Skin) No suspicious lesions. No crepitus or fluctuance. No peri-wound warmth or erythema. No masses.Marland Kitchen Psychiatric Judgement and insight Intact.. No evidence of depression, anxiety, or  agitation.. Notes the wound in the thigh is minimally open and the one on the calf had some exudate and eschar, which once removed show a very superficial ulceration. Electronic Signature(s) Signed: 04/19/2016 4:05:05 PM By: Christin Fudge MD, FACS Entered By: Christin Fudge on 04/19/2016 16:05:05 Natasha [name] ([id number]) -------------------------------------------------------------------------------- Physician Orders Details Patient Name: Natasha [name] Date of Service: 04/19/2016 3:30 PM Medical Record Number: [id number] Patient Account Number: [id number] Date of Birth/Sex: 1928/03/03 (83 y.o. Female) Treating RN: Montey Hora Primary Care Provider: Emily Filbert Other Clinician: Referring Provider: Emily Filbert Treating Provider/Extender: Frann Rider in Treatment: 32 Verbal / Phone Orders: No Diagnosis Coding Wound Cleansing Wound #1 Left,Medial Upper Leg o Clean wound with Normal Saline. o Cleanse wound with mild soap and water Wound #3 Left,Distal Lower Leg o Clean wound with Normal Saline. o Cleanse wound with mild soap and water Anesthetic Wound #1 Left,Medial Upper Leg o Topical Lidocaine 4% cream applied to wound bed prior to debridement - for clinic use Wound #3 Left,Distal Lower Leg o Topical Lidocaine 4% cream applied to wound bed prior to debridement - for clinic use Skin Barriers/Peri-Wound Care Wound #1  Left,Medial Upper Leg o Skin Prep Wound #3 Left,Distal Lower Leg o Skin Prep Primary Wound Dressing Wound #1 Left,Medial Upper Leg o Iodoform packing Gauze Secondary Dressing Wound #1 Left,Medial Upper Leg o Boardered Foam Dressing Wound #3 Left,Distal Lower Leg o Boardered Foam Dressing Dressing Change Frequency Wound #1 Left,Medial Upper Leg KAHLANI, GRABER. ([id number]) o Change dressing every other day. Wound #3 Left,Distal Lower Leg o Change dressing every other day. Follow-up Appointments Wound #1 Left,Medial Upper Leg o Return Appointment in 1 week. Wound #3 Left,Distal Lower Leg o Return Appointment in 1 week. Edema Control Wound #1 Left,Medial Upper Leg o Elevate legs to the level of the heart and pump ankles as often as possible Wound #3 Left,Distal Lower Leg o Elevate legs to the level of the heart and pump ankles as often as possible Additional Orders / Instructions Wound #1 Left,Medial Upper Leg o Increase protein intake. Wound #3 Left,Distal Lower Leg o Increase protein intake. Home Health Wound #1 Left,Medial Upper Leg o Continue Home Health Visits o Home Health Nurse may visit PRN to address patientos wound care needs. o FACE TO FACE ENCOUNTER: MEDICARE and MEDICAID PATIENTS: I certify that this patient is under my care and that I had a face-to-face encounter that meets the physician face-to-face encounter requirements with this patient on this date. The encounter with the patient was in whole or in part for the following MEDICAL CONDITION: (primary reason for [place]) MEDICAL NECESSITY: I certify, that based on my findings, NURSING services are a medically necessary home health service. HOME BOUND STATUS: I certify that my clinical findings support that this patient is homebound (i.e., Due to illness or injury, pt requires aid of supportive devices such as crutches, cane, wheelchairs, walkers, the use of  special transportation or the assistance of another person to leave their place of residence. There is a normal inability to leave the home and doing so requires considerable and taxing effort. Other absences are for medical reasons / religious services and are infrequent or of short duration when for other reasons). o If current dressing causes regression in wound condition, may D/C ordered dressing product/s and apply Normal Saline Moist Dressing daily until next [place] / Other MD appointment. [place] of regression in wound condition at [phone]. o Please direct any  NON-WOUND related issues/requests for orders to patient's Primary Care Physician Natasha [name], Natasha [name] ([id number]) Wound #3 Left,Distal Lower Leg o [place] Nurse may visit PRN to address patientos wound care needs. o FACE TO FACE ENCOUNTER: MEDICARE and MEDICAID PATIENTS: I certify that this patient is under my care and that I had a face-to-face encounter that meets the physician face-to-face encounter requirements with this patient on this date. The encounter with the patient was in whole or in part for the following MEDICAL CONDITION: (primary reason for [place]) MEDICAL NECESSITY: I certify, that based on my findings, NURSING services are a medically necessary home health service. HOME BOUND STATUS: I certify that my clinical findings support that this patient is homebound (i.e., Due to illness or injury, pt requires aid of supportive devices such as crutches, cane, wheelchairs, walkers, the use of special transportation or the assistance of another person to leave their place of residence. There is a normal inability to leave the home and doing so requires considerable and taxing effort. Other absences are for medical reasons / religious services and are infrequent or of short duration when for other reasons). o If current dressing  causes regression in wound condition, may D/C ordered dressing product/s and apply Normal Saline Moist Dressing daily until next [place] / Other MD appointment. [place] of regression in wound condition at [phone]. o Please direct any NON-WOUND related issues/requests for orders to patient's Primary Care Physician Electronic Signature(s) Signed: 04/19/2016 4:12:24 PM By: Christin Fudge MD, FACS Signed: 04/19/2016 4:50:37 PM By: Montey Hora Entered By: Montey Hora on 04/19/2016 15:56:28 Natasha [name] ([id number]) -------------------------------------------------------------------------------- Problem List Details Patient Name: Natasha [name] Date of Service: 04/19/2016 3:30 PM Medical Record Number: [id number] Patient Account Number: [id number] Date of Birth/Sex: 15-Nov-1928 (83 y.o. 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  Female) Treating RN: Montey Hora Primary Care Provider: Emily Filbert Other Clinician: Referring Provider: Emily Filbert Treating Provider/Extender: Frann Rider in Treatment: 13 Subjective Chief Complaint Information obtained from Patient Patient presents to the wound care center today with an open arterial ulcer along with diabetes mellitus to the left lower extremity which she's had for over a month History of Present Illness (HPI) The following HPI elements were documented for the patient's wound: Location: left thigh and left calf in the area where there was previous vascular surgery Quality: Patient reports experiencing a dull pain to affected area(s). Severity: Patient states wound (s) are getting better. Duration: Patient has had the wound for > 2 months prior to seeking treatment at the wound center Timing: Pain in wound is Intermittent (comes and goes Context: The wound occurred when the patient had vascular surgery and the wounds were  not healing very well Modifying Factors: Other treatment(s) tried include:local care and oral antibiotics Associated Signs and Symptoms: Patient reports having increase swelling. 83 year old female with significant past medical history of anemia, cellulitis and abscess of the leg, COPD, coronary artery disease, hypertension, osteoarthritis, peripheral vascular disease with claudication, trigeminal neuralgia and type 2 diabetes mellitus without complications. A past surgical history significant for appendectomy, left femoral peroneal bypass graft in October 2017, and debridement of skin and subcutis tissue on the left side in December 2017. She was treated by Dr. Lucky Cowboy earlier, with multiple left leg revascularization both endovascular and open. She is a former smoker who quit 30 years ago. When she was last seen at [facility], at the vascular surgery clinic she had a left lower extremity 3 separate wounds along the saphenectomy site. She was  placed on Bactrim DS for 10 days. She had a good signal over the peroneal artery and the bypass remains patent. Prior to surgery her left ABI was 0.46 and a right was 0.98. Note the patient was seen by Dr. Amalia Hailey of podiatry on 01/15/2016 and he saw for ulceration of her left great toe. Most recently the patient was seen by Dr. Maura Crandall, the vascular surgeon on January 5 and these notes are reviewed on Epic-- he noted that the 3 wounds on the left lower extremity are granulation very slowly, and he recommended packing with wet-to-dry Kerlix. The surgeon noted excellent signal over the peroneal artery and the recommendation was to see as at the wound center to help with healing. 02/12/2016 -- we have not seen her for 2 weeks and her husband tells me that she was admitted to the hospital with a pneumonia. I have reviewed a hospital notes, she was there between January 19 and January 22, with clinical sepsis and bilateral pneumonia, fever and leukocytosis and was treated with GREIDY, SHERARD. ([id number]) vancomycin and Zosyn. Vancomycin was then stopped and Zithromax was added. He was discharged home on Augmentin 02/23/2016 -- she had a arterial duplex study done recently at [facility] and her right ABI was 0.92 and the left ABI was 0.96. The femoral to peroneal bypass is widely patent. she was also seen by the surgeon who noted a postoperative seroma in the left groin. she was asked to return in 3 months for duplex examination 03/01/16 -she has got a lot of lymphedema for left lower extremity and there is weeping from the lower lateral leg. She had been walking around a lot yesterday as she had gone shopping. 03/08/2016 -- and did used to have a lot of lymphedema for left lower extremity but does not have much pain. 04/05/2016 -- She was admitted last week between March 17 to March 21 with acute respiratory failure, community-acquired pneumonia, cellulitis of the right leg, right  upper lobe pneumonia was seen on CT scan. Was treated with vancomycin and Zosyn at the time of admission and switched over to doxycycline upon discharge home. Xray of the right foot showed no fracture or dislocation and no soft tissue abnormality. Objective Constitutional Pulse regular. Respirations normal and unlabored. Afebrile. Vitals Time Taken: 3:35 PM, Height: 59 in, Weight: 140 lbs, BMI: 28.3, Temperature: 98.2 F, Pulse: 79 bpm, Respiratory Rate: 18 breaths/min, Blood Pressure: 137/52 mmHg. Eyes Nonicteric. Reactive to light. Ears, Nose, Mouth, and Throat Lips, teeth, and gums WNL.Marland Kitchen Moist mucosa without lesions. Neck supple and nontender. No palpable supraclavicular or cervical adenopathy. Normal sized without goiter. Respiratory WNL. No retractions.. Breath sounds WNL, No  rubs, rales, rhonchi, or wheeze.. Cardiovascular Heart rhythm and rate regular, no murmur or gallop.. Pedal Pulses WNL. No clubbing, cyanosis or edema. Chest Breasts symmetical and no nipple discharge.. Breast tissue WNL, no masses, lumps, or tenderness.Natasha [name], Natasha [name] ([id number]) Lymphatic No adneopathy. No adenopathy. No adenopathy. Musculoskeletal Adexa without tenderness or enlargement.. Digits and nails w/o clubbing, cyanosis, infection, petechiae, ischemia, or inflammatory conditions.Marland Kitchen Psychiatric Judgement and insight Intact.. No evidence of depression, anxiety, or agitation.. General Notes: the wound in the thigh is minimally open and the one on the calf had some exudate and eschar, which once removed show a very superficial ulceration. Integumentary (Hair, Skin) No suspicious lesions. No crepitus or fluctuance. No peri-wound warmth or erythema. No masses.. Wound #1 status is Open. Original cause of wound was Surgical Injury. The wound is located on the Left,Medial Upper Leg. The wound measures 0.2cm length x 0.1cm width x 0.5cm depth; 0.016cm^2 area and 0.008cm^3 volume. There is Fat  Layer (Subcutaneous Tissue) Exposed exposed. There is no tunneling or undermining noted. There is a medium amount of serosanguineous drainage noted. The wound margin is epibole. There is large (67-100%) red, pink granulation within the wound bed. There is a small (1-33%) amount of necrotic tissue within the wound bed including Adherent Slough. The periwound skin appearance exhibited: Scarring. The periwound skin appearance did not exhibit: Erythema. Periwound temperature was noted as No Abnormality. The periwound has tenderness on palpation. Wound #3 status is Open. Original cause of wound was Surgical Injury. The wound is located on the Left,Distal Lower Leg. The wound measures 1cm length x 0.2cm width x 0.1cm depth; 0.157cm^2 area and 0.016cm^3 volume. There is Fat Layer (Subcutaneous Tissue) Exposed exposed. There is no tunneling or undermining noted. There is a large amount of serosanguineous drainage noted. The wound margin is epibole. There is large (67-100%) red, pink granulation within the wound bed. There is a small (1-33%) amount of necrotic tissue within the wound bed including Adherent Slough. The periwound skin appearance exhibited: Scarring. The periwound skin appearance did not exhibit: Callus, Crepitus, Excoriation, Induration, Rash, Dry/Scaly, Maceration, Atrophie Blanche, Cyanosis, Ecchymosis, Hemosiderin Staining, Mottled, Pallor, Rubor, Erythema. Periwound temperature was noted as No Abnormality. The periwound has tenderness on palpation. Assessment Active Problems ICD-10 I70.242 - Atherosclerosis 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ration of calf L97.222 - Non-pressure chronic ulcer of left calf with fat layer exposed L97.122 - 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 exposed I89.0 - Lymphedema, not elsewhere classified Natasha [name], Natasha [name] ([id number]) Procedures Wound #3 Wound #3 is an Open Surgical Wound located on the Left,Distal Lower Leg . There  was a Non-Viable Tissue Open Wound/Selective [phone]) debridement with total area of 0.2 sq cm performed by Christin Fudge, MD. with the following instrument(s): Forceps to remove Non-Viable tissue/material including Exudate after achieving pain control using Lidocaine 4% Topical Solution. A time out was conducted at 15:52, prior to the start of the procedure. There was no bleeding. The procedure was tolerated well with a pain level of 0 throughout and a pain level of 0 following the procedure. Post Debridement Measurements: 1cm length x 0.2cm width x 0.1cm depth; 0.016cm^3 volume. Character of Wound/Ulcer Post Debridement is improved. Severity of Tissue Post Debridement is: Limited to breakdown of skin. Post procedure Diagnosis Wound #3: Same as Pre-Procedure Plan Wound Cleansing: Wound #1 Left,Medial Upper Leg: Clean wound with Normal Saline. Cleanse wound with mild soap and water Wound #3 Left,Distal Lower Leg: Clean wound with Normal Saline.  Cleanse wound with mild soap and water Anesthetic: Wound #1 Left,Medial Upper Leg: Topical Lidocaine 4% cream applied to wound bed prior to debridement - for clinic use Wound #3 Left,Distal Lower Leg: Topical Lidocaine 4% cream applied to wound bed prior to debridement - for clinic use Skin Barriers/Peri-Wound Care: Wound #1 Left,Medial Upper Leg: Skin Prep Wound #3 Left,Distal Lower Leg: Skin Prep Primary Wound Dressing: Wound #1 Left,Medial Upper Leg: Iodoform packing Gauze Secondary Dressing: Wound #1 Left,Medial Upper Leg: Boardered Foam Dressing Natasha [name], Natasha [name] ([id number]) Wound #3 Left,Distal Lower Leg: Boardered Foam Dressing Dressing Change Frequency: Wound #1 Left,Medial Upper Leg: Change dressing every other day. Wound #3 Left,Distal Lower Leg: Change dressing every other day. Follow-up Appointments: Wound #1 Left,Medial Upper Leg: Return Appointment in 1 week. Wound #3 Left,Distal Lower Leg: Return  Appointment in 1 week. Edema Control: Wound #1 Left,Medial Upper Leg: Elevate legs to the level of the heart and pump ankles as often as possible Wound #3 Left,Distal Lower Leg: Elevate legs to the level of the heart and pump ankles as often as possible Additional Orders / Instructions: Wound #1 Left,Medial Upper Leg: Increase protein intake. Wound #3 Left,Distal Lower Leg: Increase protein intake. Home Health: Wound #1 Left,Medial Upper Leg: [place] Nurse may visit PRN to address patient s wound care needs. FACE TO FACE ENCOUNTER: MEDICARE and MEDICAID PATIENTS: I certify that this patient is under my care and that I had a face-to-face encounter that meets the physician face-to-face encounter requirements with this patient on this date. The encounter with the patient was in whole or in part for the following MEDICAL CONDITION: (primary reason for [place]) MEDICAL NECESSITY: I certify, that based on my findings, NURSING services are a medically necessary home health service. HOME BOUND STATUS: I certify that my clinical findings support that this patient is homebound (i.e., Due to illness or injury, pt requires aid of supportive devices such as crutches, cane, wheelchairs, walkers, the use of special transportation or the assistance of another person to leave their place of residence. There is a normal inability to leave the home and doing so requires considerable and taxing effort. Other absences are for medical reasons / religious services and are infrequent or of short duration when for other reasons). If current dressing causes regression in wound condition, may D/C ordered dressing product/s and apply Normal Saline Moist Dressing daily until next [place] / Other MD appointment. [place] of regression in wound condition at [phone]. Please direct any NON-WOUND related issues/requests for orders to patient's  Primary Care Physician Wound #3 Left,Distal Lower Leg: [place] Nurse may visit PRN to address patient s wound care needs. FACE TO FACE ENCOUNTER: MEDICARE and MEDICAID PATIENTS: I certify that this patient is under my care and that I had a face-to-face encounter that meets the physician face-to-face encounter requirements with this patient on this date. The encounter with the patient was in whole or in part for the following MEDICAL CONDITION: (primary reason for [place]) MEDICAL NECESSITY: I certify, that based on my findings, NURSING services are a medically necessary home health service. HOME BOUND STATUS: I certify that my clinical findings support that this patient is homebound (i.e., Due to illness or injury, pt requires aid of supportive devices such as crutches, cane, wheelchairs, walkers, the use of special transportation or the assistance of another person to leave their place of residence. There is a Nurse, children's, [employer]  J. ([id number]) normal inability to leave the home and doing so requires considerable and taxing effort. Other absences are for medical reasons / religious services and are infrequent or of short duration when for other reasons). If current dressing causes regression in wound condition, may D/C ordered dressing product/s and apply Normal Saline Moist Dressing daily until next [place] / Other MD appointment. [place] of regression in wound condition at [phone]. Please direct any NON-WOUND related issues/requests for orders to patient's Primary Care Physician I have recommended: 1. packing of the thigh wound with 1/4 inch iodoform gauze and an appropriate foam border. 2. the wound lower down will be covered with an appropriate bordered foam 3. elevation and exercise of both lower extremities 4. Good amount of protein intake, vitamin A, vitamin C and zinc. 5. visit to the wound center next week  and I anticipate discharge soon Electronic Signature(s) Signed: 04/19/2016 4:06:19 PM By: Christin Fudge MD, FACS Entered By: Christin Fudge on 04/19/2016 16:06:19 Natasha [name] ([id number]) -------------------------------------------------------------------------------- SuperBill Details Patient Name: Natasha [name] Date of Service: 04/19/2016 Medical Record Number: [id number] Patient Account Number: [id number] Date of Birth/Sex: 05/13/1928 (83 y.o. 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 exposed I89.0 Lymphedema, not elsewhere classified Facility Procedures CPT4 Code Description: [id number] - DEBRIDE WOUND 1ST 20 SQ CM OR < ICD-10 Description Diagnosis I70.242 Atherosclerosis 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 L97.222 Non-pressure chronic ulcer of left calf with fat layer e L97.122 Non-pressure  chronic ulcer of left thigh with fat layer I89.0 Lymphedema, not elsewhere classified Modifier: ration of ca xposed exposed Quantity: 1 lf Physician Procedures CPT4 Code Description: [id number] - WC PHYS DEBR WO ANESTH 20 SQ CM ICD-10 Description Diagnosis I70.242 Atherosclerosis 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 L97.222 Non-pressure chronic ulcer of left calf with fat layer e L97.122 Non-pressure  chronic ulcer of left thigh with fat layer I89.0 Lymphedema, not elsewhere classified Modifier: ration of ca xposed exposed Quantity: 1 lf Electronic Signature(s) Signed: 04/19/2016 4:06:31 PM By: Christin Fudge MD, FACS Entered By: Christin Fudge on 04/19/2016 16:06:30

## 2016-04-26 ENCOUNTER — Encounter: Payer: Medicare Other | Admitting: Surgery

## 2016-04-26 ENCOUNTER — Ambulatory Visit: Payer: Medicare Other | Admitting: Podiatry

## 2016-04-26 DIAGNOSIS — I70242 Atherosclerosis 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ration of calf: Secondary | ICD-10-CM | POA: Diagnosis not present

## 2016-04-27 NOTE — Progress Notes (Signed)
Natasha, [name] ([id number]) Visit Report for 04/26/2016 Arrival Information Details Patient Name: Natasha [name], Natasha [name] Date of Service: 04/26/2016 2:00 PM Medical Record Number: [id number] Patient Account Number: [id number] Date of Birth/Sex: [birth year] (83 y.o. Female) Treating RN: Cornell Barman Primary Care Ellie Spickler: Emily Filbert Other Clinician: Referring Kaylinn Dedic: Emily Filbert Treating Darra Rosa/Extender: Frann Rider in Treatment: 14 Visit Information History Since Last Visit Added or deleted any medications: No Patient Arrived: [name] Any new allergies or adverse reactions: No Arrival Time: 14:07 Had a fall or experienced change in No Accompanied By: husband activities of daily living that may affect Transfer Assistance: None risk of falls: Patient Identification Verified: Yes Signs or symptoms of abuse/neglect since last No Secondary Verification Process Yes visito Completed: Hospitalized since last visit: No Patient Requires Transmission- No Has Dressing in Place as Prescribed: Yes Based Precautions: Pain Present Now: No Patient Has Alerts: Yes Patient Alerts: Patient on Blood Thinner Plavix Electronic Signature(s) Signed: 04/26/2016 5:45:26 PM By: [name], RN, [name], [name] RN, [name] Entered By: [name], RN, [name], [name] on 04/26/2016 14:07:48 Natasha [name] ([id number]) -------------------------------------------------------------------------------- Clinic Level of Care Assessment Details Patient Name: Natasha [name] Date of Service: 04/26/2016 2:00 PM Medical Record Number: [id number] Patient Account Number: [id number] Date of Birth/Sex: 1928-12-08 (83 y.o. Female) Treating RN: Cornell Barman Primary Care Rosely Fernandez: Emily Filbert Other Clinician: Referring Naelle Diegel: Emily Filbert Treating Sheriff Rodenberg/Extender: Frann Rider in Treatment: 14 Clinic Level of Care Assessment Items TOOL 4 Quantity Score []  - Use when only an EandM is performed on FOLLOW-UP  visit 0 ASSESSMENTS - Nursing Assessment / Reassessment []  - Reassessment of Co-morbidities (includes updates in patient status) 0 X - Reassessment of Adherence to Treatment Plan 1 5 ASSESSMENTS - Wound and Skin Assessment / Reassessment X - Simple Wound Assessment / Reassessment - one wound 1 5 []  - Complex Wound Assessment / Reassessment - multiple wounds 0 []  - Dermatologic / Skin Assessment (not related to wound area) 0 ASSESSMENTS - Focused Assessment []  - Circumferential Edema Measurements - multi extremities 0 []  - Nutritional Assessment / Counseling / Intervention 0 []  - Lower Extremity Assessment (monofilament, tuning fork, pulses) 0 []  - Peripheral Arterial Disease Assessment (using hand held doppler) 0 ASSESSMENTS - Ostomy and/or Continence Assessment and Care []  - Incontinence Assessment and Management 0 []  - Ostomy Care Assessment and Management (repouching, etc.) 0 PROCESS - Coordination of Care X - Simple Patient / Family Education for ongoing care 1 15 []  - Complex (extensive) Patient / Family Education for ongoing care 0 []  - Staff obtains Programmer, systems, Records, Test Results / Process Orders 0 []  - Staff telephones HHA, Nursing Homes / Clarify orders / etc 0 []  - Routine Transfer to another Facility (non-emergent condition) 0 DERIKA, ECKLES ([id number]) []  - Routine Hospital Admission (non-emergent condition) 0 []  - New Admissions / Biomedical engineer / Ordering NPWT, Apligraf, etc. 0 []  - Emergency Hospital Admission (emergent condition) 0 X - Simple Discharge Coordination 1 10 []  - Complex (extensive) Discharge Coordination 0 PROCESS - Special Needs []  - Pediatric / Minor Patient Management 0 []  - Isolation Patient Management 0 []  - Hearing / Language / Visual special needs 0 []  - Assessment of Community assistance (transportation, D/C planning, etc.) 0 []  - Additional assistance / Altered mentation 0 []  - Support Surface(s) Assessment (bed, cushion, seat,  etc.) 0 INTERVENTIONS - Wound Cleansing / Measurement X - Simple Wound Cleansing - one wound 1 5 []  - Complex Wound Cleansing - multiple wounds 0 X -  Wound Imaging (photographs - any number of wounds) 1 5 []  - Wound Tracing (instead of photographs) 0 X - Simple Wound Measurement - one wound 1 5 []  - Complex Wound Measurement - multiple wounds 0 INTERVENTIONS - Wound Dressings []  - Small Wound Dressing one or multiple wounds 0 []  - Medium Wound Dressing one or multiple wounds 0 []  - Large Wound Dressing one or multiple wounds 0 []  - Application of Medications - topical 0 []  - Application of Medications - injection 0 INTERVENTIONS - Miscellaneous []  - External ear exam 0 BRECKYN, TROYER ([id number]) []  - Specimen Collection (cultures, biopsies, blood, body fluids, etc.) 0 []  - Specimen(s) / Culture(s) sent or taken to Lab for analysis 0 []  - Patient Transfer (multiple staff / Harrel Lemon Lift / Similar devices) 0 []  - Simple Staple / Suture removal (25 or less) 0 []  - Complex Staple / Suture removal (26 or more) 0 []  - Hypo / Hyperglycemic Management (close monitor of Blood Glucose) 0 []  - Ankle / Brachial Index (ABI) - do not check if billed separately 0 X - Vital Signs 1 5 Has the patient been seen at the hospital within the last three years: Yes Total Score: 55 Level Of Care: New/Established - Level 2 Electronic Signature(s) Signed: 04/26/2016 5:45:26 PM By: [name], RN, [name], [name] RN, [name] Entered By: [name], RN, [name], [name] on 04/26/2016 14:19:47 Natasha [name] ([id number]) -------------------------------------------------------------------------------- Encounter Discharge Information Details Patient Name: Natasha [name] Date of Service: 04/26/2016 2:00 PM Medical Record Number: [id number] Patient Account Number: [id number] Date of Birth/Sex: Jul 23, 1928 (83 y.o. Female) Treating RN: Cornell Barman Primary Care Niccole Witthuhn: Emily Filbert Other Clinician: Referring Sukhman Martine: Emily Filbert Treating Zaliah Wissner/Extender: Frann Rider in Treatment: 14 Encounter Discharge Information Items Discharge Pain Level: 0 Discharge Condition: Stable Ambulatory Status: Walker Discharge Destination: Home Private Transportation: Auto Accompanied By: husband Schedule Follow-up Appointment: Yes Medication Reconciliation completed and Yes provided to Patient/Care Steven Veazie: Clinical Summary of Care: Electronic Signature(s) Signed: 04/26/2016 5:45:26 PM By: [name] RN, [name], [name] RN, [name] Entered By: [name], RN, [name], [name] on 04/26/2016 14:20:49 Natasha [name] ([id number]) -------------------------------------------------------------------------------- Lower Extremity Assessment Details Patient Name: Natasha [name] Date of Service: 04/26/2016 2:00 PM Medical Record Number: [id number] Patient Account Number: [id number] Date of Birth/Sex: 09-Jan-1929 (83 y.o. Female) Treating RN: Cornell Barman Primary Care Skyleigh Windle: Emily Filbert Other Clinician: Referring Denarius Sesler: Emily Filbert Treating Dequan Kindred/Extender: Frann Rider in Treatment: 14 Vascular Assessment Pulses: Dorsalis Pedis Palpable: [Left:Yes] Posterior Tibial Extremity colors, hair growth, and conditions: Extremity Color: [Left:Normal] Hair Growth on Extremity: [Left:Yes] Temperature of Extremity: [Left:Warm] Dependent Rubor: [Left:No] Blanched when Elevated: [Left:No] Toe Nail Assessment Left: Right: Thick: No Discolored: No Deformed: No Improper Length and Hygiene: No Electronic Signature(s) Signed: 04/26/2016 5:45:26 PM By: [name], RN, [name], [name] RN, [name] Entered By: [name], RN, [name], [name] on 04/26/2016 14:14:00 Natasha [name] ([id number]) -------------------------------------------------------------------------------- Multi Wound Chart Details Patient Name: Natasha [name] Date of Service: 04/26/2016 2:00 PM Medical Record Number: [id number] Patient Account Number: [id number] Date of  Birth/Sex: October 16, 1928 (83 y.o. Female) Treating RN: Cornell Barman Primary Care Polina Burmaster: Emily Filbert Other Clinician: Referring Kyrus Hyde: Emily Filbert Treating Leasia Swann/Extender: Frann Rider in Treatment: 14 Vital Signs Height(in): 59 Pulse(bpm): 73 Weight(lbs): 140 Blood Pressure 138/47 (mmHg): Body Mass Index(BMI): 28 Temperature(F): 97.9 Respiratory Rate 16 (breaths/min): Photos: [1:No Photos] [3:No Photos] [N/A:N/A] Wound Location: [1:Left, Medial Upper Leg] [3:Left, Distal Lower Leg] [N/A:N/A] Wounding Event: [1:Surgical Injury] [3:Surgical Injury] [N/A:N/A] Primary Etiology: [1:Open Surgical Wound] [3:Open  Surgical Wound] [N/A:N/A] Date Acquired: [1:12/04/2015] [3:12/04/2015] [N/A:N/A] Weeks of Treatment: [1:14] [3:14] [N/A:N/A] Wound Status: [1:Healed - Epithelialized] [3:Healed - Epithelialized] [N/A:N/A] Measurements L x W x D 0x0x0 [3:0x0x0] [N/A:N/A] (cm) Area (cm) : [1:0] [3:0] [N/A:N/A] Volume (cm) : [1:0] [3:0] [N/A:N/A] Classification: [1:Full Thickness With Exposed Support Structures] [3:Full Thickness With Exposed Support Structures] [N/A:N/A] Periwound Skin Texture: No Abnormalities Noted [3:No Abnormalities Noted] [N/A:N/A] Periwound Skin [1:No Abnormalities Noted] [3:No Abnormalities Noted] [N/A:N/A] Moisture: Periwound Skin Color: No Abnormalities Noted [3:No Abnormalities Noted] [N/A:N/A] Tenderness on [1:No] [3:No] [N/A:N/A] Treatment Notes Electronic Signature(s) Signed: 04/26/2016 2:20:16 PM By: Christin Fudge MD, FACS Entered By: Christin Fudge on 04/26/2016 14:20:15 Natasha [name] ([id number]) -------------------------------------------------------------------------------- [place] Details Patient Name: Natasha [name] Date of Service: 04/26/2016 2:00 PM Medical Record Number: [id number] Patient Account Number: [id number] Date of Birth/Sex: 1929/01/05 (83 y.o. Female) Treating RN: Cornell Barman Primary Care  Juliocesar Blasius: Emily Filbert Other Clinician: Referring Navil Kole: Emily Filbert Treating Gloria Ricardo/Extender: Frann Rider in Treatment: 14 Active Inactive Electronic Signature(s) Signed: 04/26/2016 5:45:26 PM By: [name] RN, [name], [name] RN, [name] Entered By: [name], RN, [name], [name] on 04/26/2016 14:16:22 Natasha [name] ([id number]) -------------------------------------------------------------------------------- Pain Assessment Details Patient Name: Natasha [name] Date of Service: 04/26/2016 2:00 PM Medical Record Number: [id number] Patient Account Number: [id number] Date of Birth/Sex: 12/13/1928 (83 y.o. Female) Treating RN: Cornell Barman Primary Care Pearlena Ow: Emily Filbert Other Clinician: Referring Gilbert Manolis: Emily Filbert Treating Ashland Osmer/Extender: Frann Rider in Treatment: 14 Active Problems Location of Pain Severity and Description of Pain Patient Has Paino No Site Locations With Dressing Change: No Pain Management and Medication Current Pain Management: Goals for Pain Management Topical or injectable lidocaine is offered to patient for acute pain when surgical debridement is performed. If needed, Patient is instructed to use over the counter pain medication for the following 24-48 hours after debridement. Wound care MDs do not prescribed pain medications. Patient has chronic pain or uncontrolled pain. Patient has been instructed to make an appointment with their Primary Care Physician for pain management. Electronic Signature(s) Signed: 04/26/2016 5:45:26 PM By: [name], RN, [name], [name] RN, [name] Entered By: [name], RN, [name], [name] on 04/26/2016 14:08:09 Natasha [name] ([id number]) -------------------------------------------------------------------------------- Patient/Caregiver Education Details Patient Name: Natasha [name] Date of Service: 04/26/2016 2:00 PM Medical Record Number: [id number] Patient Account Number: [id number] Date of Birth/Gender: [birth year] (83  y.o. Female) Treating RN: Cornell Barman Primary Care Physician: Emily Filbert Other Clinician: Referring Physician: Emily Filbert Treating Physician/Extender: Frann Rider in Treatment: 14 Education Assessment Education Provided To: Patient Education Topics Provided Wound/Skin Impairment: Handouts: Skin Care Do's and Dont's Methods: Demonstration Responses: State content correctly Electronic Signature(s) Signed: 04/26/2016 5:45:26 PM By: [name], RN, [name], [name] RN, [name] Entered By: [name], RN, [name], [name] on 04/26/2016 14:21:09 Natasha [name] ([id number]) -------------------------------------------------------------------------------- Wound Assessment Details Patient Name: Natasha [name] Date of Service: 04/26/2016 2:00 PM Medical Record Number: [id number] Patient Account Number: [id number] Date of Birth/Sex: Jun 08, 1928 (83 y.o. Female) Treating RN: Cornell Barman Primary Care Jaz Mallick: Emily Filbert Other Clinician: Referring Deondra Labrador: Emily Filbert Treating Marites Nath/Extender: Frann Rider in Treatment: 14 Wound Status Wound Number: 1 Primary Etiology: Open Surgical Wound Wound Location: Left, Medial Upper Leg Wound Status: Healed - Epithelialized Wounding Event: Surgical Injury Date Acquired: 12/04/2015 Weeks Of Treatment: 14 Clustered Wound: No Photos Photo Uploaded By: [name], RN, [name], [name] on 04/26/2016 14:29:33 Wound Measurements Length: (cm) Width: (cm) Depth: (cm) Area: (cm) Volume: (cm) 0 % Reduction in Area: 0 % Reduction in  Volume: 0 0 0 Wound Description Full Thickness With Exposed Support Classification: Structures Periwound Skin Texture Texture Color No Abnormalities Noted: No No Abnormalities Noted: No Moisture No Abnormalities Noted: No Electronic Signature(s) Signed: 04/26/2016 5:45:26 PM By: [name], RN, [name], [name] RN, [name] Entered By: [name], RN, [name], [name] on 04/26/2016 14:17:35 RONAN, DION ([id number]) MEGGIN, OLA  ([id number]) -------------------------------------------------------------------------------- Wound Assessment Details Patient Name: Natasha [name] Date of Service: 04/26/2016 2:00 PM Medical Record Number: [id number] Patient Account Number: [id number] Date of Birth/Sex: 12/28/1928 (83 y.o. Female) Treating RN: Cornell Barman Primary Care Rehmat Murtagh: Emily Filbert Other Clinician: Referring Rahma Meller: Emily Filbert Treating Kal Chait/Extender: Frann Rider in Treatment: 14 Wound Status Wound Number: 3 Primary Etiology: Open Surgical Wound Wound Location: Left, Distal Lower Leg Wound Status: Healed - Epithelialized Wounding Event: Surgical Injury Date Acquired: 12/04/2015 Weeks Of Treatment: 14 Clustered Wound: No Photos Photo Uploaded By: [name], RN, [name], [name] on 04/26/2016 14:29:33 Wound Measurements Length: (cm) Width: (cm) Depth: (cm) Area: (cm) Volume: (cm) 0 % Reduction in Area: 0 % Reduction in Volume: 0 0 0 Wound Description Full Thickness With Exposed Support Classification: Structures Periwound Skin Texture Texture Color No Abnormalities Noted: No No Abnormalities Noted: No Moisture No Abnormalities Noted: No Electronic Signature(s) Signed: 04/26/2016 5:45:26 PM By: [name], RN, [name], [name] RN, [name] Entered By: [name], RN, [name], [name] on 04/26/2016 14:17:36 Natasha [name] ([id number]) JUSTIS, DUPAS ([id number]) -------------------------------------------------------------------------------- Vitals Details Patient Name: Natasha [name] Date of Service: 04/26/2016 2:00 PM Medical Record Number: [id number] Patient Account Number: [id number] Date of Birth/Sex: 07/15/1928 (83 y.o. Female) Treating RN: Cornell Barman Primary Care Raahim Shartzer: Emily Filbert Other Clinician: Referring Willow Reczek: Emily Filbert Treating Kasidy Gianino/Extender: Frann Rider in Treatment: 14 Vital Signs Time Taken: 14:08 Temperature (F): 97.9 Height (in): 59 Pulse  (bpm): 73 Weight (lbs): 140 Respiratory Rate (breaths/min): 16 Body Mass Index (BMI): 28.3 Blood Pressure (mmHg): 138/47 Reference Range: 80 - 120 mg / dl Electronic Signature(s) Signed: 04/26/2016 5:45:26 PM By: [name], RN, [name], [name] RN, [name] Entered By: [name], RN, [name], [name] on 04/26/2016 14:08:56

## 2016-04-27 NOTE — Progress Notes (Signed)
IMAJEAN, MCDERMID ([id number]) Visit Report for 04/26/2016 Chief Complaint Document Details Patient Name: Natasha [name], Natasha [name] Date of Service: 04/26/2016 2:00 PM Medical Record Number: [id number] Patient Account Number: [id number] Date of Birth/Sex: [birth year] (83 y.o. Female) Treating RN: Cornell Barman Primary Care Provider: Emily Filbert Other Clinician: Referring Provider: Emily Filbert Treating Provider/Extender: Frann Rider in Treatment: 14 Information Obtained from: Patient Chief Complaint Patient presents to the wound care center today with an open arterial ulcer along with diabetes mellitus to the left lower extremity which she's had for over a month Electronic Signature(s) Signed: 04/26/2016 2:20:31 PM By: Christin Fudge MD, FACS Entered By: Christin Fudge on 04/26/2016 14:20:31 Natasha [name] ([id number]) -------------------------------------------------------------------------------- HPI Details Patient Name: Natasha [name] Date of Service: 04/26/2016 2:00 PM Medical Record Number: [id number] Patient Account Number: [id number] Date of Birth/Sex: Oct 01, 1928 (83 y.o. Female) Treating RN: Cornell Barman Primary Care Provider: Emily Filbert Other Clinician: Referring Provider: Emily Filbert Treating Provider/Extender: Frann Rider in Treatment: 14 History of Present Illness Location: left thigh and left calf in the area where there was previous vascular surgery Quality: Patient reports experiencing a dull pain to affected area(s). Severity: Patient states wound (s) are getting better. Duration: Patient has had the wound for > 2 months prior to seeking treatment at the wound center Timing: Pain in wound is Intermittent (comes and goes Context: The wound occurred when the patient had vascular surgery and the wounds were not healing very well Modifying Factors: Other treatment(s) tried include:local care and oral antibiotics Associated Signs and Symptoms:  Patient reports having increase swelling. HPI Description: 83 year old female with significant past medical history of anemia, cellulitis and abscess of the leg, COPD, coronary artery disease, hypertension, osteoarthritis, peripheral vascular disease with claudication, trigeminal neuralgia and type 2 diabetes mellitus without complications. A past surgical history significant for appendectomy, left femoral o peroneal bypass graft in October 2017, and debridement of skin and subcutis tissue on the left side in December 2017. She was treated by Dr. Lucky Cowboy earlier, with multiple left leg revascularization both endovascular and open. She is a former smoker who quit 30 years ago. When she was last seen at [facility], at the vascular surgery clinic she had a left lower extremity 3 separate wounds along the saphenectomy site. She was placed on Bactrim DS for 10 days. She had a good signal over the peroneal artery and the bypass remains patent. Prior to surgery her left ABI was 0.46 and a right was 0.98. Note the patient was seen by Dr. Amalia Hailey of podiatry on 01/15/2016 and he saw for ulceration of her left great toe. Most recently the patient was seen by Dr. Maura Crandall, the vascular surgeon on January 5 and these notes are reviewed on Epic-- he noted that the 3 wounds on the left lower extremity are granulation very slowly, and he recommended packing with wet-to-dry Kerlix. The surgeon noted excellent signal over the peroneal artery and the recommendation was to see as at the wound center to help with healing. 02/12/2016 -- we have not seen her for 2 weeks and her husband tells me that she was admitted to the hospital with a pneumonia. I have reviewed a hospital notes, she was there between January 19 and January 22, with clinical sepsis and bilateral pneumonia, fever and leukocytosis and was treated with vancomycin and Zosyn. Vancomycin was then stopped and Zithromax was added. He was discharged home on  Augmentin 02/23/2016 -- she had a arterial duplex study done recently at [facility] and her right ABI was 0.92 and the left ABI was 0.96. The femoral to peroneal bypass is widely patent. she was also seen by the surgeon who noted a postoperative seroma in the left groin. she was asked to return in 3 months for duplex examination 03/01/16 -she has got a lot of lymphedema for left lower extremity and there is weeping from the lower lateral leg. She had been walking around a lot yesterday as she had gone shopping. Natasha [name], Natasha [name] ([id number]) 03/08/2016 -- and did used to have a lot of lymphedema for left lower extremity but does not have much pain. 04/05/2016 -- She was admitted last week between March 17 to March 21 with acute respiratory failure, community-acquired pneumonia, cellulitis of the right leg, right upper lobe pneumonia was seen on CT scan. Was treated with vancomycin and Zosyn at the time of admission and switched over to doxycycline upon discharge home. Xray of the right foot showed no fracture or dislocation and no soft tissue abnormality. Electronic Signature(s) Signed: 04/26/2016 2:20:41 PM By: Christin Fudge MD, FACS Entered By: Christin Fudge on 04/26/2016 14:20:40 Natasha [name] ([id number]) -------------------------------------------------------------------------------- Physical Exam Details Patient Name: Natasha [name] Date of Service: 04/26/2016 2:00 PM Medical Record Number: [id number] Patient Account Number: [id number] Date of Birth/Sex: [birth year] (83 y.o. Female) Treating RN: Cornell Barman Primary Care Provider: Emily Filbert Other Clinician: Referring Provider: Emily Filbert Treating Provider/Extender: Frann Rider in Treatment: 14 Constitutional . Pulse regular. Respirations normal and unlabored. Afebrile. . Eyes Nonicteric. Reactive to light. Ears, Nose, Mouth, and Throat Lips, teeth, and gums WNL.Marland Kitchen Moist mucosa without  lesions. Neck supple and nontender. No palpable supraclavicular or cervical adenopathy. Normal sized without goiter. Respiratory WNL. No retractions.. Breath sounds WNL, No rubs, rales, rhonchi, or wheeze.. Cardiovascular Heart rhythm and rate regular, no murmur or gallop.. Pedal Pulses WNL. No clubbing, cyanosis or edema. Chest Breasts symmetical and no nipple discharge.. Breast tissue WNL, no masses, lumps, or tenderness.. Lymphatic No adneopathy. No adenopathy. No adenopathy. Musculoskeletal Adexa without tenderness or enlargement.. Digits and nails w/o clubbing, cyanosis, infection, petechiae, ischemia, or inflammatory conditions.. Integumentary (Hair, Skin) No suspicious lesions. No crepitus or fluctuance. No peri-wound warmth or erythema. No masses.Marland Kitchen Psychiatric Judgement and insight Intact.. No evidence of depression, anxiety, or agitation.. Notes both the wounds on her left lower extremity is completely healed and there is no active open ulcer or drainage. She does have stage I lymphedema Electronic Signature(s) Signed: 04/26/2016 2:21:22 PM By: Christin Fudge MD, FACS Entered By: Christin Fudge on 04/26/2016 14:21:20 Natasha [name] ([id number]) -------------------------------------------------------------------------------- Physician Orders Details Patient Name: Natasha [name] Date of Service: 04/26/2016 2:00 PM Medical Record Number: [id number] Patient Account Number: [id number] Date of Birth/Sex: 1928-07-07 (83 y.o. Female) Treating RN: Cornell Barman Primary Care Provider: Emily Filbert Other Clinician: Referring Provider: Emily Filbert Treating Provider/Extender: Frann Rider in Treatment: 20 Verbal / Phone Orders: No Diagnosis Coding Discharge From [facility] Services Wound #1 Left,Medial Upper Leg o Discharge from [place] - treatment complete Wound #3 Left,Distal Lower Leg o Discharge from [place] - treatment complete Electronic  Signature(s) Signed: 04/26/2016 4:10:34 PM By: Christin Fudge MD, FACS Signed: 04/26/2016 5:45:26 PM By: [name] RN, [name], [name] RN, [name] Entered By: [name], RN, [name], [name] on 04/26/2016 14:17:10 Natasha [name] ([id number]) -------------------------------------------------------------------------------- Problem List Details Patient Name: Natasha [name] Date of Service: 04/26/2016 2:00 PM Medical Record Number: [id number] Patient Account Number: [id number] Date of Birth/Sex: 1928/08/21 (83 y.o. 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 Female) Treating RN: Cornell Barman Primary Care Provider: Emily Filbert Other Clinician: Referring Provider: Emily Filbert Treating Provider/Extender: Frann Rider in Treatment: 14 Subjective Chief Complaint Information obtained from Patient Patient presents to the wound care center today with an open arterial ulcer along with diabetes mellitus  to the left lower extremity which she's had for over a month History of Present Illness (HPI) The following HPI elements were documented for the patient's wound: Location: left thigh and left calf in the area where there was previous vascular surgery Quality: Patient reports experiencing a dull pain to affected area(s). Severity: Patient states wound (s) are getting better. Duration: Patient has had the wound for > 2 months prior to seeking treatment at the wound center Timing: Pain in wound is Intermittent (comes and goes Context: The wound occurred when the patient had vascular surgery and the wounds were not healing very well Modifying Factors: Other treatment(s) tried include:local care and oral antibiotics Associated Signs and Symptoms: Patient reports having increase swelling. 83 year old female with significant past medical history of anemia, cellulitis and abscess of the leg, COPD, coronary artery disease, hypertension, osteoarthritis, peripheral vascular disease with claudication, trigeminal neuralgia and type 2 diabetes mellitus without complications. A past surgical history significant for appendectomy, left femoral peroneal bypass graft in October 2017, and debridement of skin and subcutis tissue on the left side in December 2017. She was treated by Dr. Lucky Cowboy earlier, with multiple left leg revascularization both endovascular and open. She is a former smoker who quit 30 years ago. When she was last seen at [facility], at the vascular surgery clinic she had a left lower extremity 3 separate wounds along the saphenectomy site. She was placed on Bactrim DS for 10 days. She had a good signal over the peroneal artery and the bypass remains patent. Prior to surgery her left ABI was 0.46 and a right was 0.98. Note the patient was seen by Dr. Amalia Hailey of podiatry on 01/15/2016 and he saw for ulceration of her left great toe. Most recently the patient was seen by Dr. Maura Crandall, the vascular  surgeon on January 5 and these notes are reviewed on Epic-- he noted that the 3 wounds on the left lower extremity are granulation very slowly, and he recommended packing with wet-to-dry Kerlix. The surgeon noted excellent signal over the peroneal artery and the recommendation was to see as at the wound center to help with healing. 02/12/2016 -- we have not seen her for 2 weeks and her husband tells me that she was admitted to the hospital with a pneumonia. I have reviewed a hospital notes, she was there between January 19 and January 22, with clinical sepsis and bilateral pneumonia, fever and leukocytosis and  was treated with Natasha [name], Natasha [name] ([id number]) vancomycin and Zosyn. Vancomycin was then stopped and Zithromax was added. He was discharged home on Augmentin 02/23/2016 -- she had a arterial duplex study done recently at [facility] and her right ABI was 0.92 and the left ABI was 0.96. The femoral to peroneal bypass is widely patent. she was also seen by the surgeon who noted a postoperative seroma in the left groin. she was asked to return in 3 months for duplex examination 03/01/16 -she has got a lot of lymphedema for left lower extremity and there is weeping from the lower lateral leg. She had been walking around a lot yesterday as she had gone shopping. 03/08/2016 -- and did used to have a lot of lymphedema for left lower extremity but does not have much pain. 04/05/2016 -- She was admitted last week between March 17 to March 21 with acute respiratory failure, community-acquired pneumonia, cellulitis of the right leg, right upper lobe pneumonia was seen on CT scan. Was treated with vancomycin and Zosyn at the time of admission and switched over to doxycycline upon discharge home. Xray of the right foot showed no fracture or dislocation and no soft tissue abnormality. Objective Constitutional Pulse regular. Respirations normal and unlabored. Afebrile. Vitals Time Taken: 2:08  PM, Height: 59 in, Weight: 140 lbs, BMI: 28.3, Temperature: 97.9 F, Pulse: 73 bpm, Respiratory Rate: 16 breaths/min, Blood Pressure: 138/47 mmHg. Eyes Nonicteric. Reactive to light. Ears, Nose, Mouth, and Throat Lips, teeth, and gums WNL.Marland Kitchen Moist mucosa without lesions. Neck supple and nontender. No palpable supraclavicular or cervical adenopathy. Normal sized without goiter. Respiratory WNL. No retractions.. Breath sounds WNL, No rubs, rales, rhonchi, or wheeze.. Cardiovascular Heart rhythm and rate regular, no murmur or gallop.. Pedal Pulses WNL. No clubbing, cyanosis or edema. Chest Breasts symmetical and no nipple discharge.. Breast tissue WNL, no masses, lumps, or tenderness.Natasha [name], Natasha [name] ([id number]) Lymphatic No adneopathy. No adenopathy. No adenopathy. Musculoskeletal Adexa without tenderness or enlargement.. Digits and nails w/o clubbing, cyanosis, infection, petechiae, ischemia, or inflammatory conditions.Marland Kitchen Psychiatric Judgement and insight Intact.. No evidence of depression, anxiety, or agitation.. General Notes: both the wounds on her left lower extremity is completely healed and there is no active open ulcer or drainage. She does have stage I lymphedema Integumentary (Hair, Skin) No suspicious lesions. No crepitus or fluctuance. No peri-wound warmth or erythema. No masses.. Wound #1 status is Healed - Epithelialized. Original cause of wound was Surgical Injury. The wound is located on the Left,Medial Upper Leg. The wound measures 0cm length x 0cm width x 0cm depth; 0cm^2 area and 0cm^3 volume. Wound #3 status is Healed - Epithelialized. Original cause of wound was Surgical Injury. The wound is located on the Left,Distal Lower Leg. The wound measures 0cm length x 0cm width x 0cm depth; 0cm^2 area and 0cm^3 volume. Assessment Active Problems ICD-10 I70.242 - Atherosclerosis 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ration of calf L97.222 - Non-pressure chronic  ulcer of left calf with fat layer exposed L97.122 - 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 exposed I89.0 - Lymphedema, not elsewhere classified Plan Discharge From [facility] Services: Wound #1 Left,Medial Upper Leg: Discharge from [place] - treatment complete Wound #3 Left,Distal Lower Leg: Natasha [name], Natasha [name] ([id number]) Discharge from [place] - treatment complete Her wounds are completely healed and I have asked her to continue elevation and exercise and wear some light compression stockings which she already has. She is discharged from the wound  care services and will be only seen back if need be Electronic Signature(s) Signed: 04/26/2016 2:22:05 PM By: Christin Fudge MD, FACS Entered By: Christin Fudge on 04/26/2016 14:22:05 Natasha [name] ([id number]) -------------------------------------------------------------------------------- SuperBill Details Patient Name: Natasha [name] Date of Service: 04/26/2016 Medical Record Number: [id number] Patient Account Number: [id number] Date of Birth/Sex: [birth year] (83 y.o. 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 I89.0 Lymphedema, not elsewhere classified Facility Procedures CPT4 Code: [id number] Description: [phone] - WOUND CARE VISIT-LEV 2 EST PT Modifier: Quantity: 1 Physician Procedures CPT4 Code Description: [id number] - WC PHYS LEVEL 3 - EST PT ICD-10 Description Diagnosis I70.242 Atherosclerosis 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 L97.222 Non-pressure chronic ulcer of left calf  with fat layer e L97.122 Non-pressure chronic  ulcer of left thigh with fat layer I89.0 Lymphedema, not elsewhere classified Modifier: ration of ca xposed exposed Quantity: 1 lf Electronic Signature(s) Signed: 04/26/2016 2:22:18 PM By: Christin Fudge MD, FACS Entered By: Christin Fudge on 04/26/2016 14:22:18

## 2016-06-25 ENCOUNTER — Other Ambulatory Visit: Payer: Self-pay | Admitting: Internal Medicine

## 2016-06-25 DIAGNOSIS — M5116 Intervertebral disc disorders with radiculopathy, lumbar region: Secondary | ICD-10-CM

## 2016-07-01 ENCOUNTER — Ambulatory Visit
Admission: RE | Admit: 2016-07-01 | Discharge: 2016-07-01 | Disposition: A | Discharge status: Home / Self Care | Payer: Medicare Other | Source: Ambulatory Visit | Attending: Internal Medicine | Admitting: Internal Medicine

## 2016-07-01 DIAGNOSIS — M48061 Spinal stenosis, lumbar region without neurogenic claudication: Secondary | ICD-10-CM | POA: Diagnosis not present

## 2016-07-01 DIAGNOSIS — M5116 Intervertebral disc disorders with radiculopathy, lumbar region: Secondary | ICD-10-CM | POA: Insufficient documentation

## 2016-07-01 DIAGNOSIS — M4856XA Collapsed vertebra, not elsewhere classified, lumbar region, initial encounter for fracture: Secondary | ICD-10-CM | POA: Diagnosis not present

## 2016-07-01 DIAGNOSIS — I700 Atherosclerosis of aorta: Secondary | ICD-10-CM | POA: Diagnosis not present

## 2016-09-28 ENCOUNTER — Emergency Department
Admission: EM | Admit: 2016-09-28 | Discharge: 2016-09-28 | Disposition: A | Discharge status: Home / Self Care | Payer: Medicare Other | Attending: Emergency Medicine | Admitting: Emergency Medicine

## 2016-09-28 ENCOUNTER — Emergency Department: Payer: Medicare Other

## 2016-09-28 ENCOUNTER — Encounter: Payer: Self-pay | Admitting: Emergency Medicine

## 2016-09-28 DIAGNOSIS — Z79899 Other long term (current) drug therapy: Secondary | ICD-10-CM | POA: Diagnosis not present

## 2016-09-28 DIAGNOSIS — Y999 Unspecified external cause status: Secondary | ICD-10-CM | POA: Diagnosis not present

## 2016-09-28 DIAGNOSIS — W03XXXA Other fall on same level due to collision with another person, initial encounter: Secondary | ICD-10-CM | POA: Diagnosis not present

## 2016-09-28 DIAGNOSIS — Z7982 Long term (current) use of aspirin: Secondary | ICD-10-CM | POA: Insufficient documentation

## 2016-09-28 DIAGNOSIS — S0000XA Unspecified superficial injury of scalp, initial encounter: Secondary | ICD-10-CM | POA: Diagnosis present

## 2016-09-28 DIAGNOSIS — I120 Hypertensive chronic kidney disease with stage 5 chronic kidney disease or end stage renal disease: Secondary | ICD-10-CM | POA: Diagnosis not present

## 2016-09-28 DIAGNOSIS — Y92480 Sidewalk as the place of occurrence of the external cause: Secondary | ICD-10-CM | POA: Diagnosis not present

## 2016-09-28 DIAGNOSIS — Z87891 Personal history of nicotine dependence: Secondary | ICD-10-CM | POA: Diagnosis not present

## 2016-09-28 DIAGNOSIS — W19XXXA Unspecified fall, initial encounter: Secondary | ICD-10-CM

## 2016-09-28 DIAGNOSIS — Z7901 Long term (current) use of anticoagulants: Secondary | ICD-10-CM | POA: Insufficient documentation

## 2016-09-28 DIAGNOSIS — Y9301 Activity, walking, marching and hiking: Secondary | ICD-10-CM | POA: Insufficient documentation

## 2016-09-28 DIAGNOSIS — J45909 Unspecified asthma, uncomplicated: Secondary | ICD-10-CM | POA: Insufficient documentation

## 2016-09-28 DIAGNOSIS — N185 Chronic kidney disease, stage 5: Secondary | ICD-10-CM | POA: Insufficient documentation

## 2016-09-28 DIAGNOSIS — S0003XA Contusion of scalp, initial encounter: Secondary | ICD-10-CM

## 2016-09-28 MED ORDER — CEPHALEXIN 500 MG PO CAPS: 500.0000 mg | ORAL_CAPSULE | Freq: Four times a day (QID) | ORAL | 0 refills | Status: AC

## 2016-09-28 MED ORDER — OXYCODONE HCL 5 MG PO TABS
2.5000 mg | ORAL_TABLET | Freq: Once | ORAL | Status: AC
  Administered 2016-09-28: 2.5 mg via ORAL
  Filled 2016-09-28: qty 1

## 2016-09-28 NOTE — ED Provider Notes (Signed)
[facility] Emergency Department Provider Note   ____________________________________________   I have reviewed the triage vital signs and the nursing notes.   HISTORY  Chief Complaint Fall   History limited by: Not Limited   HPI Natasha [name] is a 83 y.o. female who presents to the emergency department today after a fall. She was with her husband when he tripped on the sidewalk. He fell into her. This then pushed her down to the ground. She did strike the back of her head. She states that she did black out however for not very long. She has had a severe headache since that time. She denies any nausea or vomiting. She denies any associated vision change. She did suffer a small skin tear to her right elbow but does not have much pain in that elbow. Also complaining of some pain in the right hip. Per EMS she was able to stand up on scene. She denies any recent illness.   Past Medical History:  Diagnosis Date  . Anemia   . Arthritis   . Asthma   . Bilateral pneumonia may 2017  . Bronchitis   . Cardiomegaly   . Chronic respiratory failure with hypoxia ([place])   . Cochlear implant in place    bilateral, [name], 04/26/2007   . Gallstones 2015  . GERD (gastroesophageal reflux disease)   . H/O blood clots 2014  . History of home oxygen therapy    at night  . Hypertension   . Hypokalemia   . Peripheral vascular disease ([place])    with arterial clots- on xarelto  . Polyneuropathy ([place])   . Renal disorder    Chronic Kidney Disease, Stage 5  . Restless leg syndrome     Patient Active Problem List   Diagnosis Date Noted  . CAP (community acquired pneumonia) 03/27/2016  . Cellulitis 03/27/2016  . Hypokalemia 03/27/2016  . Acute respiratory failure ([place]) 03/27/2016  . Sepsis ([place]) 08/24/2015  . HCAP (healthcare-associated pneumonia) 06/27/2015  . Pneumonia 06/07/2015  . Hypotension 03/11/2015  . Lower extremity atheroembolism ([place])  02/27/2015  . Ischemic leg 02/27/2015  . Calculus of gallbladder with other cholecystitis, without mention of obstruction 04/20/2013    Past Surgical History:  Procedure Laterality Date  . ABDOMINAL HYSTERECTOMY  1960  . APPENDECTOMY  1946  . BREAST BIOPSY Bilateral    negative  . CHOLECYSTECTOMY  04-24-13  . COCHLEAR IMPLANT  2009  . COLONOSCOPY  2015   Dr. Rayann Heman   . EYE SURGERY  2008,2009   cataract  . KYPHOPLASTY N/A 08/19/2015   Procedure: KYPHOPLASTY;  Surgeon: Hessie Knows, MD;  Location: [facility] ORS;  Service: Orthopedics;  Laterality: N/A;  . PERIPHERAL VASCULAR CATHETERIZATION N/A 02/27/2015   Procedure: Abdominal Aortogram w/Lower Extremity;  Surgeon: Algernon Huxley, MD;  Location: [place] [facility];  Service: Cardiovascular;  Laterality: N/A;  . PERIPHERAL VASCULAR CATHETERIZATION  02/27/2015   Procedure: Lower Extremity Intervention;  Surgeon: Algernon Huxley, MD;  Location: [place] [facility];  Service: Cardiovascular;;  . PERIPHERAL VASCULAR CATHETERIZATION Left 02/28/2015   Procedure: Lower Extremity Angiography;  Surgeon: Algernon Huxley, MD;  Location: [place] [facility];  Service: Cardiovascular;  Laterality: Left;  . PERIPHERAL VASCULAR CATHETERIZATION  02/28/2015   Procedure: Lower Extremity Intervention;  Surgeon: Algernon Huxley, MD;  Location: [place] [facility];  Service: Cardiovascular;;  . PERIPHERAL VASCULAR CATHETERIZATION Left 05/22/2015   Procedure: Lower Extremity Angiography;  Surgeon: Algernon Huxley, MD;  Location: [place] [facility];  Service: Cardiovascular;  Laterality: Left;  . PERIPHERAL VASCULAR CATHETERIZATION  05/22/2015   Procedure: Lower Extremity Intervention;  Surgeon: Algernon Huxley, MD;  Location: [place] [facility];  Service: Cardiovascular;;  . PERIPHERAL VASCULAR CATHETERIZATION Left 05/23/2015   Procedure: Lower Extremity Angiography;  Surgeon: Algernon Huxley, MD;  Location: [place] [facility];  Service: Cardiovascular;  Laterality: Left;  . PERIPHERAL  VASCULAR CATHETERIZATION  05/23/2015   Procedure: Lower Extremity Intervention;  Surgeon: Algernon Huxley, MD;  Location: [place] [facility];  Service: Cardiovascular;;  . stent placement  2006-2014   multiple stent placements in legs    Prior to Admission medications   Medication Sig Start Date End Date Taking? Authorizing Provider  albuterol (PROVENTIL HFA;VENTOLIN HFA) 108 (90 BASE) MCG/ACT inhaler Inhale 2 puffs into the lungs 2 (two) times daily as needed for wheezing or shortness of breath.     [provider]  albuterol (PROVENTIL) (2.5 MG/3ML) 0.083% nebulizer solution Take 3 mLs (2.5 mg total) by nebulization every 6 (six) hours as needed for wheezing or shortness of breath. 06/11/15   Loletha Grayer, MD  aspirin EC 325 MG tablet Take 325 mg by mouth at bedtime.     [provider]  budesonide (PULMICORT) 180 MCG/ACT inhaler Inhale 2 puffs into the lungs 2 (two) times daily.    [provider]  Calcium Carbonate-Vit D-Min (CALCIUM 1200) 1200-1000 MG-UNIT CHEW Chew 1 tablet by mouth daily.    [provider]  cholecalciferol (VITAMIN D) 1000 units tablet Take 1,000 Units by mouth every morning.     [provider]  clopidogrel (PLAVIX) 75 MG tablet Take 75 mg by mouth daily.    [provider]  diltiazem (CARDIZEM CD) 180 MG 24 hr capsule Take 180 mg by mouth every morning.     [provider]  doxycycline (VIBRA-TABS) 100 MG tablet Take 1 tablet (100 mg total) by mouth every 12 (twelve) hours. 03/31/16   Loletha Grayer, MD  Esomeprazole Magnesium (NEXIUM PO) Take 22.3 mg by mouth 2 (two) times daily.     [provider]  furosemide (LASIX) 20 MG tablet Take 20 mg by mouth 2 (two) times daily.    [provider]  gabapentin (NEURONTIN) 300 MG capsule TAKE ONE CAPSULE BY MOUTH TWICE A DAY 03/29/16   Algernon Huxley, MD  Melatonin 5 MG TABS Take 5 mg by mouth at bedtime.     [provider]  montelukast  (SINGULAIR) 10 MG tablet Take 10 mg by mouth every morning.     [provider]  oxyCODONE (ROXICODONE) 5 MG immediate release tablet Take 1 tablet (5 mg total) by mouth every 4 (four) hours as needed for severe pain. Patient taking differently: Take 2.5 mg by mouth every 4 (four) hours as needed for severe pain.  08/19/15   Hessie Knows, MD  potassium chloride SA (K-DUR,KLOR-CON) 20 MEQ tablet Take 1 tablet (20 mEq total) by mouth See admin instructions. Take 2 tablets (40 mEq) by mouth every morning, Take 20 mEq by mouth every afternoon, and Take 2 tablets (40 mEq) by mouth every night at bedtime. 03/31/16   Loletha Grayer, MD  pramipexole (MIRAPEX) 0.5 MG tablet Take 0.5 mg by mouth 2 (two) times daily. 06/30/15   [provider]  simvastatin (ZOCOR) 20 MG tablet Take 20 mg by mouth at bedtime. 07/03/15   [provider]  temazepam (RESTORIL) 15 MG capsule Take  15 mg by mouth at bedtime as needed for sleep.    [provider]    Allergies Codeine; Hydrocodone; Levofloxacin; and Tramadol  Family History  Problem Relation Age of Onset  . Heart disease Mother   . Heart disease Father   . Cancer Sister        lung  . Breast cancer Daughter     Social History Social History  Substance Use Topics  . Smoking status: Former Smoker    Packs/day: 1.00    Years: 25.00    Types: Cigarettes  . Smokeless tobacco: Never Used  . Alcohol use No    Review of Systems Constitutional: No fever/chills Eyes: No visual changes. ENT: No sore throat. Cardiovascular: Denies chest pain. Respiratory: Denies shortness of breath. Gastrointestinal: No abdominal pain.  No nausea, no vomiting.  No diarrhea.   Genitourinary: Negative for dysuria. Musculoskeletal: Negative for back pain. Skin: Positive for skin tear to right elbow.  Neurological: Positive for headache.   ____________________________________________   PHYSICAL EXAM:  VITAL SIGNS: ED Triage Vitals  [09/28/16 1841]  Enc Vitals Group     BP 132/65     Pulse 85     Resp 17     Temp 98.4     Temp src      SpO2 91     Weight    Constitutional: Alert and oriented.  Eyes: Conjunctivae are normal.  ENT   Head: Normocephalic. Hematoma to the right scalp   Nose: No congestion/rhinnorhea.   Mouth/Throat: Mucous membranes are moist.   Neck: No stridor. No midline tenderness Hematological/Lymphatic/Immunilogical: No cervical lymphadenopathy. Cardiovascular: Normal rate, regular rhythm.  No murmurs, rubs, or gallops.  Respiratory: Normal respiratory effort without tachypnea nor retractions. Breath sounds are clear and equal bilaterally. No wheezes/rales/rhonchi. Gastrointestinal: Soft and non tender. No rebound. No guarding.  Genitourinary: Deferred Musculoskeletal: Normal range of motion in all extremities. No lower extremity edema. Neurologic:  Normal speech and language. No gross focal neurologic deficits are appreciated.  Skin:  Skin is warm, dry. Small skin tear to right elbow  Psychiatric: Mood and affect are normal. Speech and behavior are normal. Patient exhibits appropriate insight and judgment.  ____________________________________________    LABS (pertinent positives/negatives)  None  ____________________________________________   EKG  None  ____________________________________________    RADIOLOGY  CT head/cervical spine No acute process, slightly limited due to implants  ____________________________________________   PROCEDURES  Procedures  ____________________________________________   INITIAL IMPRESSION / ASSESSMENT AND PLAN / ED COURSE  Pertinent labs & imaging results that were available during my care of the patient were reviewed by me and considered in my medical decision making (see chart for details).  Patient presented to the emergency department today after mechanical fall. Head CT did not show any obvious intracranial  hemorrhage although there was some limited secondary to cochlear implants. However patient did not have any neuro deficits or progressive symptoms during her course here in the emergency department. Do think it is safe patient to be discharged home. Did discuss return precautions with patient and husband.  ____________________________________________   FINAL CLINICAL IMPRESSION(S) / ED DIAGNOSES  Final diagnoses:  Fall, initial encounter  Hematoma of scalp, initial encounter     Note: This dictation was prepared with Diplomatic Services operational officer dictation. Any transcriptional errors that result from this process are unintentional     Nance Pear, MD 09/29/16 0020

## 2016-09-28 NOTE — ED Notes (Addendum)
Pt and pt's husband report increased redness that started 3 days ago and "some warmth" to left lower extrem (below knee) . Pt denies fever. Pedal pulse intact and strong. Light redness noted to left lower extrem and moderate warmth to leg below knee, no draiange at present however pt states she has seen some drainage to abrasion on knee in last 3 days. EDP notified of this, reports will start pt on antibiotic.

## 2016-09-28 NOTE — Discharge Instructions (Signed)
Please seek medical attention for any high fevers, chest pain, shortness of breath, change in behavior, persistent vomiting, bloody stool or any other new or concerning symptoms.  

## 2016-09-28 NOTE — ED Triage Notes (Signed)
Pt via ems from [facility] after a fall. Her husband tripped and fell into her, knocking her to the ground. She has hematoma to posterior head. Pt c/o 10/10 headache. Sates she "blacked out" for a few seconds. Pt denies n/v. A&O x 4.

## 2016-09-28 NOTE — ED Notes (Signed)
Oxygen reader replaced on pt.

## 2016-10-29 ENCOUNTER — Observation Stay
Admission: EM | Admit: 2016-10-29 | Discharge: 2016-10-31 | Disposition: A | Discharge status: Home / Self Care | Payer: Medicare Other | Attending: Internal Medicine | Admitting: Internal Medicine

## 2016-10-29 ENCOUNTER — Emergency Department: Payer: Medicare Other

## 2016-10-29 DIAGNOSIS — Z79899 Other long term (current) drug therapy: Secondary | ICD-10-CM | POA: Diagnosis not present

## 2016-10-29 DIAGNOSIS — M5441 Lumbago with sciatica, right side: Principal | ICD-10-CM | POA: Insufficient documentation

## 2016-10-29 DIAGNOSIS — M549 Dorsalgia, unspecified: Secondary | ICD-10-CM | POA: Diagnosis present

## 2016-10-29 DIAGNOSIS — I120 Hypertensive chronic kidney disease with stage 5 chronic kidney disease or end stage renal disease: Secondary | ICD-10-CM | POA: Insufficient documentation

## 2016-10-29 DIAGNOSIS — G2581 Restless legs syndrome: Secondary | ICD-10-CM | POA: Insufficient documentation

## 2016-10-29 DIAGNOSIS — G8929 Other chronic pain: Secondary | ICD-10-CM | POA: Insufficient documentation

## 2016-10-29 DIAGNOSIS — Z87891 Personal history of nicotine dependence: Secondary | ICD-10-CM | POA: Insufficient documentation

## 2016-10-29 DIAGNOSIS — I2510 Atherosclerotic heart disease of native coronary artery without angina pectoris: Secondary | ICD-10-CM | POA: Diagnosis not present

## 2016-10-29 DIAGNOSIS — M25551 Pain in right hip: Secondary | ICD-10-CM

## 2016-10-29 DIAGNOSIS — Z885 Allergy status to narcotic agent status: Secondary | ICD-10-CM | POA: Insufficient documentation

## 2016-10-29 DIAGNOSIS — Z7982 Long term (current) use of aspirin: Secondary | ICD-10-CM | POA: Insufficient documentation

## 2016-10-29 DIAGNOSIS — J449 Chronic obstructive pulmonary disease, unspecified: Secondary | ICD-10-CM | POA: Insufficient documentation

## 2016-10-29 DIAGNOSIS — G629 Polyneuropathy, unspecified: Secondary | ICD-10-CM | POA: Diagnosis not present

## 2016-10-29 DIAGNOSIS — N185 Chronic kidney disease, stage 5: Secondary | ICD-10-CM | POA: Diagnosis not present

## 2016-10-29 DIAGNOSIS — Z951 Presence of aortocoronary bypass graft: Secondary | ICD-10-CM | POA: Insufficient documentation

## 2016-10-29 DIAGNOSIS — I739 Peripheral vascular disease, unspecified: Secondary | ICD-10-CM | POA: Diagnosis not present

## 2016-10-29 DIAGNOSIS — Z7902 Long term (current) use of antithrombotics/antiplatelets: Secondary | ICD-10-CM | POA: Insufficient documentation

## 2016-10-29 DIAGNOSIS — M79604 Pain in right leg: Secondary | ICD-10-CM | POA: Diagnosis present

## 2016-10-29 DIAGNOSIS — Z9981 Dependence on supplemental oxygen: Secondary | ICD-10-CM | POA: Diagnosis not present

## 2016-10-29 DIAGNOSIS — M5430 Sciatica, unspecified side: Secondary | ICD-10-CM

## 2016-10-29 LAB — URINALYSIS, COMPLETE (UACMP) WITH MICROSCOPIC
BACTERIA UA: NONE SEEN
BILIRUBIN URINE: NEGATIVE
Glucose, UA: NEGATIVE mg/dL
Hgb urine dipstick: NEGATIVE
Ketones, ur: NEGATIVE mg/dL
LEUKOCYTES UA: NEGATIVE
NITRITE: NEGATIVE
Protein, ur: NEGATIVE mg/dL
SPECIFIC GRAVITY, URINE: 1.008 (ref 1.005–1.030)
pH: 5 (ref 5.0–8.0)

## 2016-10-29 LAB — CBC WITH DIFFERENTIAL/PLATELET
BASOS ABS: 0.1 10*3/uL (ref 0–0.1)
BASOS PCT: 1 %
EOS ABS: 0.1 10*3/uL (ref 0–0.7)
Eosinophils Relative: 1 %
HCT: 41.3 % (ref 35.0–47.0)
Hemoglobin: 13.8 g/dL (ref 12.0–16.0)
Lymphocytes Relative: 14 %
Lymphs Abs: 1 10*3/uL (ref 1.0–3.6)
MCH: 31.4 pg (ref 26.0–34.0)
MCHC: 33.5 g/dL (ref 32.0–36.0)
MCV: 93.9 fL (ref 80.0–100.0)
MONO ABS: 0.6 10*3/uL (ref 0.2–0.9)
MONOS PCT: 8 %
NEUTROS PCT: 76 %
Neutro Abs: 5.5 10*3/uL (ref 1.4–6.5)
PLATELETS: 274 10*3/uL (ref 150–440)
RBC: 4.4 MIL/uL (ref 3.80–5.20)
RDW: 16.6 % — AB (ref 11.5–14.5)
WBC: 7.3 10*3/uL (ref 3.6–11.0)

## 2016-10-29 LAB — COMPREHENSIVE METABOLIC PANEL
ALBUMIN: 3.8 g/dL (ref 3.5–5.0)
ALK PHOS: 186 U/L — AB (ref 38–126)
ALT: 18 U/L (ref 14–54)
ANION GAP: 12 (ref 5–15)
AST: 21 U/L (ref 15–41)
BUN: 21 mg/dL — ABNORMAL HIGH (ref 6–20)
CALCIUM: 9.9 mg/dL (ref 8.9–10.3)
CO2: 31 mmol/L (ref 22–32)
Chloride: 97 mmol/L — ABNORMAL LOW (ref 101–111)
Creatinine, Ser: 0.93 mg/dL (ref 0.44–1.00)
GFR calc Af Amer: >60 mL/min (ref >60)
GFR calc non Af Amer: 54 mL/min — ABNORMAL LOW (ref >60)
GLUCOSE: 127 mg/dL — AB (ref 65–99)
Potassium: 4.7 mmol/L (ref 3.5–5.1)
SODIUM: 140 mmol/L (ref 135–145)
Total Bilirubin: 0.5 mg/dL (ref 0.3–1.2)
Total Protein: 7.9 g/dL (ref 6.5–8.1)

## 2016-10-29 MED ORDER — BUDESONIDE 180 MCG/ACT IN AEPB
2.0000 | INHALATION_SPRAY | Freq: Two times a day (BID) | RESPIRATORY_TRACT | Status: DC
  Filled 2016-10-29: qty 1

## 2016-10-29 MED ORDER — GABAPENTIN 600 MG PO TABS: 300.0000 mg | ORAL_TABLET | Freq: Two times a day (BID) | ORAL | Status: DC

## 2016-10-29 MED ORDER — FENTANYL CITRATE (PF) 100 MCG/2ML IJ SOLN: 25.0000 ug | Freq: Once | INTRAMUSCULAR | Status: DC

## 2016-10-29 MED ORDER — DILTIAZEM HCL ER COATED BEADS 180 MG PO CP24
180.0000 mg | ORAL_CAPSULE | ORAL | Status: DC
  Administered 2016-10-30 – 2016-10-31 (×2): 180 mg via ORAL
  Filled 2016-10-29 (×2): qty 1

## 2016-10-29 MED ORDER — TORSEMIDE 20 MG PO TABS
20.0000 mg | ORAL_TABLET | Freq: Two times a day (BID) | ORAL | Status: DC
  Administered 2016-10-30 – 2016-10-31 (×3): 20 mg via ORAL
  Filled 2016-10-29 (×4): qty 1

## 2016-10-29 MED ORDER — ALBUTEROL SULFATE HFA 108 (90 BASE) MCG/ACT IN AERS: 2.0000 | INHALATION_SPRAY | Freq: Two times a day (BID) | RESPIRATORY_TRACT | Status: DC | PRN

## 2016-10-29 MED ORDER — GABAPENTIN 100 MG PO CAPS: 100.0000 mg | ORAL_CAPSULE | Freq: Four times a day (QID) | ORAL | Status: DC

## 2016-10-29 MED ORDER — POTASSIUM CHLORIDE CRYS ER 20 MEQ PO TBCR
20.0000 meq | EXTENDED_RELEASE_TABLET | Freq: Two times a day (BID) | ORAL | Status: DC
  Administered 2016-10-30 – 2016-10-31 (×3): 20 meq via ORAL
  Filled 2016-10-29 (×3): qty 1

## 2016-10-29 MED ORDER — GABAPENTIN 100 MG PO CAPS
100.0000 mg | ORAL_CAPSULE | Freq: Two times a day (BID) | ORAL | Status: DC
  Administered 2016-10-29 – 2016-10-30 (×3): 100 mg via ORAL
  Filled 2016-10-29 (×3): qty 1

## 2016-10-29 MED ORDER — CALCIUM 1200 1200-1000 MG-UNIT PO CHEW: 1.0000 | CHEWABLE_TABLET | Freq: Every day | ORAL | Status: DC

## 2016-10-29 MED ORDER — PANTOPRAZOLE SODIUM 40 MG PO TBEC
40.0000 mg | DELAYED_RELEASE_TABLET | Freq: Every day | ORAL | Status: DC
Start: 2016-10-29 — End: 2016-10-31
  Administered 2016-10-29 – 2016-10-31 (×3): 40 mg via ORAL
  Filled 2016-10-29 (×3): qty 1

## 2016-10-29 MED ORDER — MONTELUKAST SODIUM 10 MG PO TABS
10.0000 mg | ORAL_TABLET | ORAL | Status: DC
  Administered 2016-10-30 – 2016-10-31 (×2): 10 mg via ORAL
  Filled 2016-10-29 (×2): qty 1

## 2016-10-29 MED ORDER — ALBUTEROL SULFATE (2.5 MG/3ML) 0.083% IN NEBU: 2.5000 mg | INHALATION_SOLUTION | Freq: Four times a day (QID) | RESPIRATORY_TRACT | Status: DC | PRN

## 2016-10-29 MED ORDER — SIMVASTATIN 10 MG PO TABS
20.0000 mg | ORAL_TABLET | Freq: Every day | ORAL | Status: DC
  Filled 2016-10-29: qty 2

## 2016-10-29 MED ORDER — ALBUTEROL SULFATE (2.5 MG/3ML) 0.083% IN NEBU: 2.5000 mg | INHALATION_SOLUTION | RESPIRATORY_TRACT | Status: DC | PRN

## 2016-10-29 MED ORDER — ENOXAPARIN SODIUM 40 MG/0.4ML ~~LOC~~ SOLN
40.0000 mg | SUBCUTANEOUS | Status: DC
  Administered 2016-10-29: 40 mg via SUBCUTANEOUS
  Filled 2016-10-29 (×2): qty 0.4

## 2016-10-29 MED ORDER — CALCIUM CARBONATE-VITAMIN D 500-200 MG-UNIT PO TABS
2.0000 | ORAL_TABLET | Freq: Every day | ORAL | Status: DC
  Administered 2016-10-30 – 2016-10-31 (×2): 2 via ORAL
  Filled 2016-10-29 (×2): qty 2

## 2016-10-29 MED ORDER — OXYCODONE-ACETAMINOPHEN 5-325 MG PO TABS
1.0000 | ORAL_TABLET | ORAL | Status: DC | PRN
  Administered 2016-10-29 – 2016-10-31 (×4): 1 via ORAL
  Filled 2016-10-29 (×4): qty 1

## 2016-10-29 MED ORDER — BUMETANIDE 1 MG PO TABS: 1.0000 mg | ORAL_TABLET | Freq: Every day | ORAL | Status: DC

## 2016-10-29 MED ORDER — POTASSIUM CHLORIDE CRYS ER 20 MEQ PO TBCR: 20.0000 meq | EXTENDED_RELEASE_TABLET | ORAL | Status: DC

## 2016-10-29 MED ORDER — ASPIRIN EC 325 MG PO TBEC
325.0000 mg | DELAYED_RELEASE_TABLET | Freq: Every day | ORAL | Status: DC
  Administered 2016-10-30: 325 mg via ORAL
  Filled 2016-10-29: qty 1

## 2016-10-29 MED ORDER — SIMVASTATIN 20 MG PO TABS
20.0000 mg | ORAL_TABLET | Freq: Every day | ORAL | Status: DC
  Administered 2016-10-29 – 2016-10-30 (×2): 20 mg via ORAL
  Filled 2016-10-29 (×3): qty 1

## 2016-10-29 MED ORDER — MORPHINE SULFATE (PF) 4 MG/ML IV SOLN
4.0000 mg | Freq: Once | INTRAVENOUS | Status: AC
  Administered 2016-10-29: 4 mg via INTRAMUSCULAR
  Filled 2016-10-29: qty 1

## 2016-10-29 MED ORDER — BUDESONIDE 0.5 MG/2ML IN SUSP
0.5000 mg | Freq: Two times a day (BID) | RESPIRATORY_TRACT | Status: DC
  Administered 2016-10-30 – 2016-10-31 (×3): 0.5 mg via RESPIRATORY_TRACT
  Filled 2016-10-29 (×3): qty 2

## 2016-10-29 MED ORDER — GABAPENTIN 100 MG PO CAPS
200.0000 mg | ORAL_CAPSULE | Freq: Every day | ORAL | Status: DC
  Administered 2016-10-30 – 2016-10-31 (×2): 200 mg via ORAL
  Filled 2016-10-29 (×2): qty 2

## 2016-10-29 MED ORDER — PRAMIPEXOLE DIHYDROCHLORIDE 0.25 MG PO TABS
0.5000 mg | ORAL_TABLET | Freq: Two times a day (BID) | ORAL | Status: DC
  Administered 2016-10-29 – 2016-10-31 (×4): 0.5 mg via ORAL
  Filled 2016-10-29 (×4): qty 2

## 2016-10-29 MED ORDER — TORSEMIDE 20 MG PO TABS: 20.0000 mg | ORAL_TABLET | Freq: Every day | ORAL | Status: DC

## 2016-10-29 MED ORDER — CLOPIDOGREL BISULFATE 75 MG PO TABS
75.0000 mg | ORAL_TABLET | Freq: Every day | ORAL | Status: DC
  Administered 2016-10-30 – 2016-10-31 (×2): 75 mg via ORAL
  Filled 2016-10-29 (×2): qty 1

## 2016-10-29 NOTE — ED Notes (Signed)
Pt on phone, updated on delay to floor.

## 2016-10-29 NOTE — ED Triage Notes (Addendum)
Pt to ED from [facility] via [facility] c/o right sided hip pain. Per EMS pt fell 1 month ago, and over the past week has had increased right hip pain and decreased mobility. Pt reports not "being able to lift foot to take a step because it is very painful". Pt reports taking 1/2 of an oxycodone. She is alert and oriented in no acute distress.

## 2016-10-29 NOTE — H&P (Signed)
[facility] NAME: Natasha [name]    MR#:  [id number]  DATE OF BIRTH:  Jun 04, 1928  DATE OF ADMISSION:  10/29/2016  PRIMARY CARE PHYSICIAN: Rusty Aus, MD   REQUESTING/REFERRING PHYSICIAN: Dr. Brayton Layman  CHIEF COMPLAINTS;low back pain radiating down the right leg   Chief Complaint  Patient presents with  . Hip Pain    HISTORY OF PRESENT ILLNESS:  Natasha [name]  is a 83 y.o. female with a known history of COPD on nocturnal oxygen, bilateral cochlear implants and very hard of hearing, GERD, essential hypertension,past medical peripheral neuropathy, chronic kidney disease stage V comes in because of severe back pain radiating down the right leg for last 3-4 days with difficulty ambulating, even with little movement patient has significant pain. Admitting her for sciatica and pain control. X-ray of the hip and lumbosacral x-rays are normal.  PAST MEDICAL HISTORY:   Past Medical History:  Diagnosis Date  . Anemia   . Arthritis   . Asthma   . Bilateral pneumonia may 2017  . Bronchitis   . Cardiomegaly   . Chronic respiratory failure with hypoxia ([place])   . Cochlear implant in place    bilateral, [name], 04/26/2007   . Gallstones 2015  . GERD (gastroesophageal reflux disease)   . H/O blood clots 2014  . History of home oxygen therapy    at night  . Hypertension   . Hypokalemia   . Peripheral vascular disease ([place])    with arterial clots- on xarelto  . Polyneuropathy   . Renal disorder    Chronic Kidney Disease, Stage 5  . Restless leg syndrome     PAST SURGICAL HISTOIRY:   Past Surgical History:  Procedure Laterality Date  . ABDOMINAL HYSTERECTOMY  1960  . APPENDECTOMY  1946  . BREAST BIOPSY Bilateral    negative  . CHOLECYSTECTOMY  04-24-13  . COCHLEAR IMPLANT  2009  . COLONOSCOPY  2015   Dr. Rayann Heman   . EYE SURGERY  2008,2009   cataract  . KYPHOPLASTY N/A 08/19/2015   Procedure:  KYPHOPLASTY;  Surgeon: Hessie Knows, MD;  Location: [facility];  Service: Orthopedics;  Laterality: N/A;  . PERIPHERAL VASCULAR CATHETERIZATION N/A 02/27/2015   Procedure: Abdominal Aortogram w/Lower Extremity;  Surgeon: Algernon Huxley, MD;  Location: [facility];  Service: Cardiovascular;  Laterality: N/A;  . PERIPHERAL VASCULAR CATHETERIZATION  02/27/2015   Procedure: Lower Extremity Intervention;  Surgeon: Algernon Huxley, MD;  Location: [facility];  Service: Cardiovascular;;  . PERIPHERAL VASCULAR CATHETERIZATION Left 02/28/2015   Procedure: Lower Extremity Angiography;  Surgeon: Algernon Huxley, MD;  Location: [facility];  Service: Cardiovascular;  Laterality: Left;  . PERIPHERAL VASCULAR CATHETERIZATION  02/28/2015   Procedure: Lower Extremity Intervention;  Surgeon: Algernon Huxley, MD;  Location: [facility];  Service: Cardiovascular;;  . PERIPHERAL VASCULAR CATHETERIZATION Left 05/22/2015   Procedure: Lower Extremity Angiography;  Surgeon: Algernon Huxley, MD;  Location: [facility];  Service: Cardiovascular;  Laterality: Left;  . PERIPHERAL VASCULAR CATHETERIZATION  05/22/2015   Procedure: Lower Extremity Intervention;  Surgeon: Algernon Huxley, MD;  Location: [facility];  Service: Cardiovascular;;  . PERIPHERAL VASCULAR CATHETERIZATION Left 05/23/2015   Procedure: Lower Extremity Angiography;  Surgeon: Algernon Huxley, MD;  Location: [facility];  Service: Cardiovascular;  Laterality: Left;  . PERIPHERAL VASCULAR CATHETERIZATION  05/23/2015   Procedure:  Lower Extremity Intervention;  Surgeon: Algernon Huxley, MD;  Location: [facility];  Service: Cardiovascular;;  . stent placement  2006-2014   multiple stent placements in legs    SOCIAL HISTORY:   Social History  Substance Use Topics  . Smoking status: Former Smoker    Packs/day: 1.00    Years: 25.00    Types: Cigarettes  . Smokeless tobacco: Never Used  . Alcohol use No    FAMILY  HISTORY:   Family History  Problem Relation Age of Onset  . Heart disease Mother   . Heart disease Father   . Cancer Sister        lung  . Breast cancer Daughter     DRUG ALLERGIES:   Allergies  Allergen Reactions  . Codeine Diarrhea    Patient can tolerate small amounts of OXYCODONE per patient info sheet  . Hydrocodone Nausea And Vomiting    Patient can tolerate small amounts of OXYCODONE per patient info sheet  . Levofloxacin Other (See Comments)    Muscle cramps  . Tramadol Nausea And Vomiting    REVIEW OF SYSTEMS:  CONSTITUTIONAL: No fever, fatigue or weakness. Very hard of hearing. EYES: No blurred or double vision.  EARS, NOSE, AND THROAT: No tinnitus or ear pain.  RESPIRATORY: No cough, shortness of breath, wheezing or hemoptysis.  CARDIOVASCULAR: No chest pain, orthopnea, edema.  GASTROINTESTINAL: No nausea, vomiting, diarrhea or abdominal pain.  GENITOURINARY: No dysuria, hematuria.  ENDOCRINE: No polyuria, nocturia,  HEMATOLOGY: No anemia, easy bruising or bleeding SKIN: No rash or lesion. MUSCULOSKELETAL: low back pain radiating down the right leg and unable to lift the right leg patient is having severe pain in the back. He went to rise the left leg patient is having pain in the back which is disabling.  NEUROLOGIC: No tingling, numbness, weakness.  PSYCHIATRY: No anxiety or depression.   MEDICATIONS AT HOME:   Prior to Admission medications   Medication Sig Start Date End Date Taking? Authorizing Provider  aspirin EC 325 MG tablet Take 325 mg by mouth at bedtime.    Yes [provider]  budesonide (PULMICORT) 180 MCG/ACT inhaler Inhale 2 puffs into the lungs 2 (two) times daily.   Yes [provider]  bumetanide (BUMEX) 1 MG tablet Take 1 mg by mouth daily.   Yes [provider]  Calcium Carbonate-Vit D-Min (CALCIUM 1200) 1200-1000 MG-UNIT CHEW Chew 1 tablet by mouth daily.   Yes [provider]  clopidogrel (PLAVIX) 75  MG tablet Take 75 mg by mouth daily.   Yes [provider]  diltiazem (CARDIZEM CD) 180 MG 24 hr capsule Take 180 mg by mouth every morning.    Yes [provider]  gabapentin (NEURONTIN) 100 MG capsule Take 100 mg by mouth 4 (four) times daily.   Yes [provider]  Melatonin 5 MG TABS Take 5 mg by mouth at bedtime.    Yes [provider]  Misc Natural Products (GLUCOS-CHONDROIT-MSM COMPLEX PO) Take by mouth.   Yes [provider]  montelukast (SINGULAIR) 10 MG tablet Take 10 mg by mouth every morning.    Yes [provider]  Multiple Vitamins-Minerals (MULTIVITAMIN WITH MINERALS) tablet Take 1 tablet by mouth daily.   Yes [provider]  potassium chloride SA (K-DUR,KLOR-CON) 20 MEQ tablet Take 1 tablet (20 mEq total) by mouth See admin instructions. Take 2 tablets (40 mEq) by mouth every morning, Take 20 mEq by mouth every afternoon, and Take  2 tablets (40 mEq) by mouth every night at bedtime. 03/31/16  Yes Wieting, Richard, MD  pramipexole (MIRAPEX) 0.5 MG tablet Take 0.5 mg by mouth 2 (two) times daily. 06/30/15  Yes [provider]  torsemide (DEMADEX) 20 MG tablet Take 20 mg by mouth daily.   Yes [provider]  albuterol (PROVENTIL HFA;VENTOLIN HFA) 108 (90 BASE) MCG/ACT inhaler Inhale 2 puffs into the lungs 2 (two) times daily as needed for wheezing or shortness of breath.     [provider]  albuterol (PROVENTIL) (2.5 MG/3ML) 0.083% nebulizer solution Take 3 mLs (2.5 mg total) by nebulization every 6 (six) hours as needed for wheezing or shortness of breath. Patient not taking: Reported on 10/29/2016 06/11/15   Loletha Grayer, MD  doxycycline (VIBRA-TABS) 100 MG tablet Take 1 tablet (100 mg total) by mouth every 12 (twelve) hours. Patient not taking: Reported on 10/29/2016 03/31/16   Loletha Grayer, MD  Esomeprazole Magnesium (NEXIUM PO) Take 22.3 mg by mouth 2 (two) times daily.     [provider]  oxyCODONE (ROXICODONE) 5 MG immediate release tablet Take 1 tablet (5 mg total) by mouth every 4 (four) hours as needed for severe pain. Patient not taking: Reported on 10/29/2016 08/19/15   Hessie Knows, MD  simvastatin (ZOCOR) 20 MG tablet Take 20 mg by mouth at bedtime. 07/03/15   [provider]      VITAL SIGNS:  Blood pressure 100/81, pulse 82, temperature 99 F (37.2 C), temperature source Oral, resp. rate 20, height 4\' 11"  (1.499 m), weight 62.1 kg (137 lb), SpO2 (!) 83 %.  PHYSICAL EXAMINATION:  GENERAL:  83 y.o.-year-old patient lying in the bed with no acute distress.  EYES: Pupils equal, round, reactive to light and accommodation. No scleral icterus. Extraocular muscles intact.  HEENT: Head atraumatic, normocephalic. Oropharynx and nasopharynx clear.  NECK:  Supple, no jugular venous distention. No thyroid enlargement, no tenderness.  LUNGS: Normal breath sounds bilaterally, no wheezing, rales,rhonchi or crepitation. No use of accessory muscles of respiration.  CARDIOVASCULAR: S1, S2 normal. No murmurs, rubs, or gallops.  ABDOMEN: Soft, nontender, nondistended. Bowel sounds present. No organomegaly or mass.  EXTREMITIES: No pedal edema, cyanosis, or clubbing.  NEUROLOGIC: Cranial nerves II through XII are intact. Muscle strength 5/5 in all extremities. Sensation intact. Gait not checked.  PSYCHIATRIC: The patient is alert and oriented x 3.  SKIN: No obvious rash, lesion, or ulcer.   LABORATORY PANEL:   CBC  Recent Labs Lab 10/29/16 1556  WBC 7.3  HGB 13.8  HCT 41.3  PLT 274   ------------------------------------------------------------------------------------------------------------------  Chemistries   Recent Labs Lab 10/29/16 1556  NA 140  K 4.7  CL 97*  CO2 31  GLUCOSE 127*  BUN 21*  CREATININE 0.93  CALCIUM 9.9  AST 21  ALT 18  ALKPHOS 186*  BILITOT 0.5    ------------------------------------------------------------------------------------------------------------------  Cardiac Enzymes No results for input(s): TROPONINI in the last 168 hours. ------------------------------------------------------------------------------------------------------------------  RADIOLOGY:  Dg Lumbar Spine Complete  Result Date: 10/29/2016 CLINICAL DATA:  Right hip and low back pain for 1 week. EXAM: LUMBAR SPINE - COMPLETE 4+ VIEW COMPARISON:  08/24/2015 FINDINGS: Bones are demineralized. Convex rightward lumbar scoliosis evident. Patient is status post vertebral augmentation at L4. Diffuse loss of disc height noted in the lumbar spine with associated diffuse endplate degeneration/spurring. No gross fracture or subluxation evident although subtle fractures could be obscured due to the superimposition of bony anatomy related to scoliosis. SI joints are unremarkable. IMPRESSION:  Stable exam. Scoliosis with diffuse degenerative disc and endplate changes. Prior vertebral augmentation at L4. Electronically Signed   By: Misty Stanley M.D.   On: 10/29/2016 14:37   Dg Hip Unilat W Or Wo Pelvis 2-3 Views Right  Result Date: 10/29/2016 CLINICAL DATA:  Right hip pain EXAM: DG HIP (WITH OR WITHOUT PELVIS) 2-3V RIGHT COMPARISON:  None. FINDINGS: Bones are osteopenic. No fracture or dislocation of the right hip. There are multiple vascular clips overlying the pelvis and bilateral femoral artery stents. IMPRESSION: No acute abnormality of the right hip. Electronically Signed   By: Ulyses Jarred M.D.   On: 10/29/2016 14:34    EKG:   Orders placed or performed during the hospital encounter of 03/27/16  . ED EKG  . ED EKG    IMPRESSION AND PLAN:   83 year old female patient with severe back pain going down the right leg likely due to sciatica,ith lumbar radiculopathy: patient is admitted for pain control, patient having difficulty ambulation because of significant pain. Get  physical therapy evaluation continue pain medicine, and see how she does and likely she is home tomorrow with home health. Patient does not have any cauda equina syndrome at this time, no incontinence of bladder or bowel at this time. No numbness below the knee. CT lumbar spine recently in June that time which showed diffuse the disc degeneration, severe foraminal stenosis at level of L3-L4 on the right side, chronic L4 compression fracture status post augmentation. Patient will be admitted for pain control, physical therapy, if she does not get better with pain control she may need epidural steroid injection.order MRI of lumbosacral spine now.  #2 .history of coronary artery disease, CABG last year. Continue aspirin, Plavix, statins.she has chronic leg pain. Patient is on Neurontin, Percocet at this time.  All the records are reviewed and case discussed with ED provider. Management plans discussed with the patient, family and they are in agreement.  CODE STATUS: full code  TOTAL TIME TAKING CARE OF THIS PATIENT: 19minutes.    Epifanio Lesches M.D on 10/29/2016 at 7:00 PM  Between 7am to 6pm - Pager - [phone]  After 6pm go to [email or web address] - password [email or web address] [facility]  Office  [phone]  CC: Primary care physician; Rusty Aus, MD  Note: This dictation was prepared with Dragon dictation along with smaller phrase technology. Any transcriptional errors that result from this process are unintentional.

## 2016-10-29 NOTE — ED Provider Notes (Signed)
[facility]  ____________________________________________   I have reviewed the triage vital signs and the nursing notes.   HISTORY  Chief Complaint Hip Pain    HPI Natasha [name] is a 83 y.o. female With a history of sciatica on the right presents with a sharp pain that shoots all the way down her right leg. She denies any weakness or fall recently she did fall many weeks ago. She states the pain is like lightening, it's worse when she raises the leg or tries to walk. His been too painful for her to walk since yesterday. Gradual onset pain. Nothing makes it better. She tried taking a pain pill before coming in. She has had no fever or chills no dysuria no numbness no weakness. Feels very similar prior sciatica she states. Patient does live in a independent living with her 61 year old husband who states he is having difficulty and care of her because she is having difficulty ambulating    Past Medical History:  Diagnosis Date  . Anemia   . Arthritis   . Asthma   . Bilateral pneumonia may 2017  . Bronchitis   . Cardiomegaly   . Chronic respiratory failure with hypoxia ([place])   . Cochlear implant in place    bilateral, [name], 04/26/2007   . Gallstones 2015  . GERD (gastroesophageal reflux disease)   . H/O blood clots 2014  . History of home oxygen therapy    at night  . Hypertension   . Hypokalemia   . Peripheral vascular disease ([place])    with arterial clots- on xarelto  . Polyneuropathy   . Renal disorder    Chronic Kidney Disease, Stage 5  . Restless leg syndrome     Patient Active Problem List   Diagnosis Date Noted  . CAP (community acquired pneumonia) 03/27/2016  . Cellulitis 03/27/2016  . Hypokalemia 03/27/2016  . Acute respiratory failure ([place]) 03/27/2016  . Sepsis ([place]) 08/24/2015  . HCAP (healthcare-associated pneumonia) 06/27/2015  . Pneumonia 06/07/2015  . Hypotension  03/11/2015  . Lower extremity atheroembolism ([place]) 02/27/2015  . Ischemic leg 02/27/2015  . Calculus of gallbladder with other cholecystitis, without mention of obstruction 04/20/2013    Past Surgical History:  Procedure Laterality Date  . ABDOMINAL HYSTERECTOMY  1960  . APPENDECTOMY  1946  . BREAST BIOPSY Bilateral    negative  . CHOLECYSTECTOMY  04-24-13  . COCHLEAR IMPLANT  2009  . COLONOSCOPY  2015   Dr. Rayann Heman   . EYE SURGERY  2008,2009   cataract  . KYPHOPLASTY N/A 08/19/2015   Procedure: KYPHOPLASTY;  Surgeon: Hessie Knows, MD;  Location: [facility] ORS;  Service: Orthopedics;  Laterality: N/A;  . PERIPHERAL VASCULAR CATHETERIZATION N/A 02/27/2015   Procedure: Abdominal Aortogram w/Lower Extremity;  Surgeon: Algernon Huxley, MD;  Location: [place] [facility];  Service: Cardiovascular;  Laterality: N/A;  . PERIPHERAL VASCULAR CATHETERIZATION  02/27/2015   Procedure: Lower Extremity Intervention;  Surgeon: Algernon Huxley, MD;  Location: [place] [facility];  Service: Cardiovascular;;  . PERIPHERAL VASCULAR CATHETERIZATION Left 02/28/2015   Procedure: Lower Extremity Angiography;  Surgeon: Algernon Huxley, MD;  Location: [place] [facility];  Service: Cardiovascular;  Laterality: Left;  . PERIPHERAL VASCULAR CATHETERIZATION  02/28/2015   Procedure: Lower Extremity Intervention;  Surgeon: Algernon Huxley, MD;  Location: [place] [facility];  Service: Cardiovascular;;  . PERIPHERAL VASCULAR CATHETERIZATION Left 05/22/2015   Procedure: Lower Extremity Angiography;  Surgeon: Corene Cornea  Bunnie Domino, MD;  Location: [place] [facility];  Service: Cardiovascular;  Laterality: Left;  . PERIPHERAL VASCULAR CATHETERIZATION  05/22/2015   Procedure: Lower Extremity Intervention;  Surgeon: Algernon Huxley, MD;  Location: [place] [facility];  Service: Cardiovascular;;  . PERIPHERAL VASCULAR CATHETERIZATION Left 05/23/2015   Procedure: Lower Extremity Angiography;  Surgeon: Algernon Huxley, MD;  Location: [place] [facility];  Service:  Cardiovascular;  Laterality: Left;  . PERIPHERAL VASCULAR CATHETERIZATION  05/23/2015   Procedure: Lower Extremity Intervention;  Surgeon: Algernon Huxley, MD;  Location: [place] [facility];  Service: Cardiovascular;;  . stent placement  2006-2014   multiple stent placements in legs    Prior to Admission medications   Medication Sig Start Date End Date Taking? Authorizing Provider  aspirin EC 325 MG tablet Take 325 mg by mouth at bedtime.    Yes [provider]  budesonide (PULMICORT) 180 MCG/ACT inhaler Inhale 2 puffs into the lungs 2 (two) times daily.   Yes [provider]  bumetanide (BUMEX) 1 MG tablet Take 1 mg by mouth daily.   Yes [provider]  Calcium Carbonate-Vit D-Min (CALCIUM 1200) 1200-1000 MG-UNIT CHEW Chew 1 tablet by mouth daily.   Yes [provider]  clopidogrel (PLAVIX) 75 MG tablet Take 75 mg by mouth daily.   Yes [provider]  diltiazem (CARDIZEM CD) 180 MG 24 hr capsule Take 180 mg by mouth every morning.    Yes [provider]  gabapentin (NEURONTIN) 100 MG capsule Take 100 mg by mouth 4 (four) times daily.   Yes [provider]  Melatonin 5 MG TABS Take 5 mg by mouth at bedtime.    Yes [provider]  Misc Natural Products (GLUCOS-CHONDROIT-MSM COMPLEX PO) Take by mouth.   Yes [provider]  montelukast (SINGULAIR) 10 MG tablet Take 10 mg by mouth every morning.    Yes [provider]  Multiple Vitamins-Minerals (MULTIVITAMIN WITH MINERALS) tablet Take 1 tablet by mouth daily.   Yes [provider]  potassium chloride SA (K-DUR,KLOR-CON) 20 MEQ tablet Take 1 tablet (20 mEq total) by mouth See admin instructions. Take 2 tablets (40 mEq) by mouth every morning, Take 20 mEq by mouth every afternoon, and Take 2 tablets (40 mEq) by mouth every night at bedtime. 03/31/16  Yes Wieting, Richard, MD  pramipexole (MIRAPEX) 0.5 MG tablet Take 0.5 mg by mouth 2 (two) times daily.  06/30/15  Yes [provider]  torsemide (DEMADEX) 20 MG tablet Take 20 mg by mouth daily.   Yes [provider]  albuterol (PROVENTIL HFA;VENTOLIN HFA) 108 (90 BASE) MCG/ACT inhaler Inhale 2 puffs into the lungs 2 (two) times daily as needed for wheezing or shortness of breath.     [provider]  albuterol (PROVENTIL) (2.5 MG/3ML) 0.083% nebulizer solution Take 3 mLs (2.5 mg total) by nebulization every 6 (six) hours as needed for wheezing or shortness of breath. Patient not taking: Reported on 10/29/2016 06/11/15   Loletha Grayer, MD  doxycycline (VIBRA-TABS) 100 MG tablet Take 1 tablet (100 mg total) by mouth every 12 (twelve) hours. Patient not taking: Reported on 10/29/2016 03/31/16   Loletha Grayer, MD  Esomeprazole Magnesium (NEXIUM PO) Take 22.3 mg by mouth 2 (two) times daily.     [provider]  oxyCODONE (ROXICODONE) 5 MG immediate release tablet Take 1 tablet (5 mg total) by mouth every 4 (four) hours as needed for severe pain. Patient not taking: Reported on  10/29/2016 08/19/15   Hessie Knows, MD  simvastatin (ZOCOR) 20 MG tablet Take 20 mg by mouth at bedtime. 07/03/15   [provider]    Allergies Codeine; Hydrocodone; Levofloxacin; and Tramadol  Family History  Problem Relation Age of Onset  . Heart disease Mother   . Heart disease Father   . Cancer Sister        lung  . Breast cancer Daughter     Social History Social History  Substance Use Topics  . Smoking status: Former Smoker    Packs/day: 1.00    Years: 25.00    Types: Cigarettes  . Smokeless tobacco: Never Used  . Alcohol use No    Review of Systems Constitutional: No fever/chills Eyes: No visual changes. ENT: No sore throat. No stiff neck no neck pain Cardiovascular: Denies chest pain. Respiratory: Denies shortness of breath. Gastrointestinal:   no vomiting.  No diarrhea.  No constipation. Genitourinary: Negative for dysuria. Musculoskeletal: Negative  lower extremity swelling Skin: Negative for rash. Neurological: Negative for severe headaches, focal weakness or numbness.   ____________________________________________   PHYSICAL EXAM:  VITAL SIGNS: ED Triage Vitals  Enc Vitals Group     BP 10/29/16 1330 (!) 150/54     Pulse Rate 10/29/16 1330 79     Resp 10/29/16 1334 20     Temp 10/29/16 1334 99 F (37.2 C)     Temp Source 10/29/16 1334 Oral     SpO2 10/29/16 1330 (!) 85 %     Weight 10/29/16 1334 137 lb (62.1 kg)     Height 10/29/16 1334 4\' 11"  (1.499 m)     Head Circumference --      Peak Flow --      Pain Score 10/29/16 1431 7     Pain Loc --      Pain Edu? --      Excl. in [place]? --     Constitutional: Alert and oriented. Well appearing and in no acute distress. Eyes: Conjunctivae are normal Head: Atraumatic HEENT: No congestion/rhinnorhea. Mucous membranes are moist.  Oropharynx non-erythematous Neck:   Nontender with no meningismus, no masses, no stridor Cardiovascular: Normal rate, regular rhythm. Grossly normal heart sounds.  Good peripheral circulation. Respiratory: Normal respiratory effort.  No retractions. Lungs CTAB. Abdominal: Soft and nontender. No distention. No guarding no rebound Back:  there is minimal discomfort to palpation in the right lower back with no lesions no flank pain  there is no midline tenderness there are no lesions noted. there is no CVA tenderness Musculoskeletal: slight tenderness to the R hip. There is subjective pain I  patient lifts up her right leg with pain shooting down the leg no upper extremity tenderness. No joint effusions, no DVT signs strong distal pulses no edema Neurologic:  Normal speech and language. No gross focal neurologic deficits are appreciated.  Skin:  Skin is warm, dry and intact. No rash noted. Psychiatric: Mood and affect are normal. Speech and behavior are normal.  ____________________________________________   LABS (all labs ordered are listed, but only  abnormal results are displayed)  Labs Reviewed  CBC WITH DIFFERENTIAL/PLATELET  URINALYSIS, COMPLETE (UACMP) WITH MICROSCOPIC  COMPREHENSIVE METABOLIC PANEL    Pertinent labs  results that were available during my care of the patient were reviewed by me and considered in my medical decision making (see chart for details). ____________________________________________  EKG  I personally interpreted any EKGs ordered by me or triage  ____________________________________________  RADIOLOGY  Pertinent labs & imaging  results that were available during my care of the patient were reviewed by me and considered in my medical decision making (see chart for details). If possible, patient and/or family made aware of any abnormal findings. ____________________________________________    PROCEDURES  Procedure(s) performed: None  Procedures  Critical Care performed: None  ____________________________________________   INITIAL IMPRESSION / ASSESSMENT AND PLAN / ED COURSE  Pertinent labs & imaging results that were available during my care of the patient were reviewed by me and considered in my medical decision making (see chart for details).  patient with signs and symptoms of sciatica no evidence of fracture or acute injury to the hip. She does have a history of sciatica she is not acutely week. However she is having great difficulty with ambulation because of pain. We did give her morphine which helped take the pain away for little while but then she had a great deal of pain when trying to [place] to the bathroom. I don't think she'll be safely do that and her independent living. We'll check basic blood work and try IV pain medications and see if that gets his father along. patient is 29 and we must be careful with the dosing. There is no evidence of cauda equina syndrome or occult fracture noted, no recent fall or trauma. No incontinence of bowel or bladder    ____________________________________________   FINAL CLINICAL IMPRESSION(S) / ED DIAGNOSES  Final diagnoses:  None      This chart was dictated using voice recognition software.  Despite best efforts to proofread,  errors can occur which can change meaning.      Schuyler Amor, MD 10/29/16 [id number]

## 2016-10-29 NOTE — ED Notes (Signed)
Pt transport to 217

## 2016-10-29 NOTE — ED Notes (Signed)
Attempt to call report without success.  

## 2016-10-29 NOTE — ED Notes (Signed)
Pt ambulated from bed to toilet with a walker. Unsteady gait observed, and patient reported "hip pain is worse with walking". MD made aware.

## 2016-10-30 DIAGNOSIS — M5441 Lumbago with sciatica, right side: Secondary | ICD-10-CM | POA: Diagnosis not present

## 2016-10-30 LAB — BASIC METABOLIC PANEL
Anion gap: 8 (ref 5–15)
BUN: 17 mg/dL (ref 6–20)
CHLORIDE: 98 mmol/L — AB (ref 101–111)
CO2: 30 mmol/L (ref 22–32)
CREATININE: 0.75 mg/dL (ref 0.44–1.00)
Calcium: 9.2 mg/dL (ref 8.9–10.3)
Glucose, Bld: 100 mg/dL — ABNORMAL HIGH (ref 65–99)
POTASSIUM: 4.1 mmol/L (ref 3.5–5.1)
SODIUM: 136 mmol/L (ref 135–145)

## 2016-10-30 MED ORDER — ALUM & MAG HYDROXIDE-SIMETH 200-200-20 MG/5ML PO SUSP
30.0000 mL | ORAL | Status: DC | PRN
  Administered 2016-10-30: 30 mL via ORAL
  Filled 2016-10-30: qty 30

## 2016-10-30 NOTE — Clinical Social Work Note (Addendum)
CSW aware via chart review that this patient has admitted from [facility]. CSW will monitor PT recommendations; however, the patient is under observation with traditional Medicare which will not cover STR. Patient may have the option to admit to [facility] for Respite should she have days remaining if PT recommends STR.  Santiago Bumpers, MSW, Latanya Presser [phone]

## 2016-10-30 NOTE — Progress Notes (Signed)
[facility] [place] NAME: Kenlei Safi    MR#:  [id number]  DATE OF BIRTH:  February 21, 1928  SUBJECTIVE: 83 year old female patient admitted because of severe low back pain going down the right leg associated with inability to ambulate. No numbness in the right leg. Patient did walk with physical therapy today. Now she resting in the chair. She wants to stay 1 more day for pain control and likely go home tomorrow. She says she is feeling slightly better than yesterday but would like to stay 1 more day.   CHIEF COMPLAINT:   Chief Complaint  Patient presents with  . Hip Pain    REVIEW OF SYSTEMS:   ROS CONSTITUTIONAL: No fever, fatigue or weakness.  EYES: No blurred or double vision.  EARS, NOSE, AND THROAT: No tinnitus or ear pain. pt is very hard of hearing. RESPIRATORY: No cough, shortness of breath, wheezing or hemoptysis.  CARDIOVASCULAR: No chest pain, orthopnea, edema.  GASTROINTESTINAL: No nausea, vomiting, diarrhea or abdominal pain.  GENITOURINARY: No dysuria, hematuria.  ENDOCRINE: No polyuria, nocturia,  HEMATOLOGY: No anemia, easy bruising or bleeding SKIN: No rash or lesion. MUSCULOSKELETAL: Low back pain going down the right leg. NEUROLOGIC: No tingling, numbness, weakness.  PSYCHIATRY: No anxiety or depression.   DRUG ALLERGIES:   Allergies  Allergen Reactions  . Codeine Diarrhea    Patient can tolerate small amounts of OXYCODONE per patient info sheet  . Hydrocodone Nausea And Vomiting    Patient can tolerate small amounts of OXYCODONE per patient info sheet  . Levofloxacin Other (See Comments)    Muscle cramps  . Tramadol Nausea And Vomiting    VITALS:  Blood pressure (!) 151/52, pulse 70, temperature 98.5 F (36.9 C), temperature source Oral, resp. rate 18, height 4\' 11"  (1.499 m), weight 63.5 kg (140 lb 1.6 oz), SpO2 95 %.  PHYSICAL EXAMINATION:  GENERAL:  83 y.o.-year-old patient lying in the bed  with no acute distress.  EYES: Pupils equal, round, reactive to light and accommodation. No scleral icterus. Extraocular muscles intact.  HEENT: Head atraumatic, normocephalic. Oropharynx and nasopharynx clear.  NECK:  Supple, no jugular venous distention. No thyroid enlargement, no tenderness.  LUNGS: Normal breath sounds bilaterally, no wheezing, rales,rhonchi or crepitation. No use of accessory muscles of respiration.  CARDIOVASCULAR: S1, S2 normal. No murmurs, rubs, or gallops.  ABDOMEN: Soft, nontender, nondistended. Bowel sounds present. No organomegaly or mass.  EXTREMITIES: No pedal edema, cyanosis, or clubbing.  NEUROLOGIC: Cranial nerves II through XII are intact. Muscle strength 5/5 in all extremities. Sensation intact. Gait not checked. SLR test limited on right side due to sciatica and low back pain. PSYCHIATRIC: The patient is alert and oriented x 3.  SKIN: No obvious rash, lesion, or ulcer.    LABORATORY PANEL:   CBC  Recent Labs Lab 10/29/16 1556  WBC 7.3  HGB 13.8  HCT 41.3  PLT 274   ------------------------------------------------------------------------------------------------------------------  Chemistries   Recent Labs Lab 10/29/16 1556 10/30/16 0445  NA 140 136  K 4.7 4.1  CL 97* 98*  CO2 31 30  GLUCOSE 127* 100*  BUN 21* 17  CREATININE 0.93 0.75  CALCIUM 9.9 9.2  AST 21  --   ALT 18  --   ALKPHOS 186*  --   BILITOT 0.5  --    ------------------------------------------------------------------------------------------------------------------  Cardiac Enzymes No results for input(s): TROPONINI in the last 168 hours. ------------------------------------------------------------------------------------------------------------------  RADIOLOGY:  Dg Lumbar Spine Complete  Result  Date: 10/29/2016 CLINICAL DATA:  Right hip and low back pain for 1 week. EXAM: LUMBAR SPINE - COMPLETE 4+ VIEW COMPARISON:  08/24/2015 FINDINGS: Bones are demineralized.  Convex rightward lumbar scoliosis evident. Patient is status post vertebral augmentation at L4. Diffuse loss of disc height noted in the lumbar spine with associated diffuse endplate degeneration/spurring. No gross fracture or subluxation evident although subtle fractures could be obscured due to the superimposition of bony anatomy related to scoliosis. SI joints are unremarkable. IMPRESSION: Stable exam. Scoliosis with diffuse degenerative disc and endplate changes. Prior vertebral augmentation at L4. Electronically Signed   By: Misty Stanley M.D.   On: 10/29/2016 14:37   Dg Hip Unilat W Or Wo Pelvis 2-3 Views Right  Result Date: 10/29/2016 CLINICAL DATA:  Right hip pain EXAM: DG HIP (WITH OR WITHOUT PELVIS) 2-3V RIGHT COMPARISON:  None. FINDINGS: Bones are osteopenic. No fracture or dislocation of the right hip. There are multiple vascular clips overlying the pelvis and bilateral femoral artery stents. IMPRESSION: No acute abnormality of the right hip. Electronically Signed   By: Ulyses Jarred M.D.   On: 10/29/2016 14:34    EKG:   Orders placed or performed during the hospital encounter of 03/27/16  . ED EKG  . ED EKG    ASSESSMENT AND PLAN:   83 year old female patient with severe low back pain, pain radiating to the right leg. With lumbar radiculopathy: Admitted to hospital, continue Percocet, Neurontin, clear discharge tomorrow home with home health. [facility] is from [facility].  #2 . essential hypertension: Controlled, continue torsemide 20 MG twice a day. #3 history of coronary artery disease, CABG a year ago. Continue aspirin, Plavix, statins, patient has chronic leg pains use Percocet for pain control, use Neurontin also for neuropathy. 4.Essential hypertension: Continue Cardizem 180 mg daily.  #5.. history of COPD: No wheezing, continue Pulmicort nebulizer at this time, albuterol nebulizer when necessary,  All the records are reviewed and case discussed with  Care Management/Social Workerr. Management plans discussed with the patient, family and they are in agreement.  CODE STATUS: full  TOTAL TIME TAKING CARE OF THIS PATIENT: 35 minutes.   POSSIBLE D/C IN 1DAYS, DEPENDING ON CLINICAL CONDITION.   Epifanio Lesches M.D on 10/30/2016 at 1:30 PM  Between 7am to 6pm - Pager - [phone]  After 6pm go to [email or web address] - password [email or web address] [place] [facility]  Office  [phone]  CC: Primary care physician; Rusty Aus, MD   Note: This dictation was prepared with Dragon dictation along with smaller phrase technology. Any transcriptional errors that result from this process are unintentional.

## 2016-10-30 NOTE — Progress Notes (Addendum)
Pt ambulated in the hallway approximately 100 feet with walker and assistance . Pt was taken back in the room and placed in recliner with chair alarm activated. Nasal Canula w/ 2L of O2 was reapplied to the pt.   Pt did complain of mild nausea but nausea was relieved when given ginger ale and saline crackers. Primary RN notified of this information.

## 2016-10-30 NOTE — Care Management Obs Status (Signed)
[facility] NOTIFICATION   Patient Details  Name: Natasha BARLOWE MRN: [id number] Date of Birth: [birth year]   Medicare Observation Status Notification Given:  Yes    Tong Pieczynski A, RN 10/30/2016, 3:55 PM

## 2016-10-31 DIAGNOSIS — M5441 Lumbago with sciatica, right side: Secondary | ICD-10-CM | POA: Diagnosis not present

## 2016-10-31 MED ORDER — TORSEMIDE 20 MG PO TABS: 20.0000 mg | ORAL_TABLET | Freq: Two times a day (BID) | ORAL | 0 refills | Status: DC

## 2016-10-31 MED ORDER — OXYCODONE-ACETAMINOPHEN 5-325 MG PO TABS: 1.0000 | ORAL_TABLET | ORAL | 0 refills | Status: DC | PRN

## 2016-10-31 NOTE — Discharge Instructions (Signed)
Acute Pain, Adult Acute pain is a type of pain that may last for just a few days or as long as six months. It is often related to an illness, injury, or medical procedure. Acute pain may be mild, moderate, or severe. It usually goes away once your injury has healed or you are no longer ill. Pain can make it hard for you to do daily activities. It can cause anxiety and lead to other problems if left untreated. Treatment depends on the cause and severity of your acute pain. Follow these instructions at home:  Check your pain level as told by your health care provider.  Take over-the-counter and prescription medicines only as told by your health care provider.  If you are taking prescription pain medicine: ? Ask your health care provider about taking a stool softener or laxative to prevent constipation. ? Do not stop taking the medicine suddenly. Talk to your health care provider about how and when to discontinue prescription pain medicine. ? If your pain is severe, do not take more pills than instructed by your health care provider. ? Do not take other over-the-counter pain medicines in addition to this medicine unless told by your health care provider. ? Do not drive or operate heavy machinery while taking prescription pain medicine.  Apply ice or heat as told by your health care provider. These may reduce swelling and pain.  Ask your health care provider if other strategies such as distraction, relaxation, or physical therapies can help your pain.  Keep all follow-up visits as told by your health care provider. This is important. Contact a health care provider if:  You have pain that is not controlled by medicine.  Your pain does not improve or gets worse.  You have side effects from pain medicines, such as vomitingor confusion. Get help right away if:  You have severe pain.  You have trouble breathing.  You lose consciousness.  You have chest pain or pressure that lasts for more  than a few minutes. Along with the chest pain you may: ? Have pain or discomfort in one or both arms, your back, neck, jaw, or stomach. ? Have shortness of breath. ? Break out in a cold sweat. ? Feel nauseous. ? Become light-headed. These symptoms may represent a serious problem that is an emergency. Do not wait to see if the symptoms will go away. Get medical help right away. Call your local emergency services (911 in the [place] Do not drive yourself to the hospital. This information is not intended to replace advice given to you by your health care provider. Make sure you discuss any questions you have with your health care provider. Document Released: 01/12/2015 Document Revised: 06/06/2015 Document Reviewed: 01/12/2015 Elsevier Interactive Patient Education  2018 [employer].   Follow all MD discharge instructions. Take all medications as prescribed. Keep all follow up appointments. If your symptoms return, call your doctor. If you experience any new symptoms that are of concern to you or that are bothersome to you, call your doctor. For all questions and/or concerns, call your doctor.  If you have a medical emergency, call 911

## 2016-10-31 NOTE — Progress Notes (Signed)
O2 sats:  - At rest room air = 94%  - Ambulating on room air = 85%  - at rest on O2 = 98%

## 2016-10-31 NOTE — Care Management Note (Addendum)
Case Management Note  Patient Details  Name: FRUMA AFRICA MRN: [id number] Date of Birth: March 22, 1928  Subjective/Objective:    Discussed discharge planning with Mrs Godbee  Who is very hard of hearing, and with Mr Lorenzi. Mrs Hugill qualifies for new home oxygen and has a qualifying diagnosis of Asthma. A referral for home health PT, SW, Aide, and OT and new continuous oxygen was called to Melene Muller at [facility]. [facility] will deliver a portable 02 tank to Mrs Soltis's [facility] room for use during transport home to [facility],  [place] order for continuous 02 at 2L N/C faxed to [facility] per unable to reach the liaison. [facility] is providing nocturnal 02 only for Mrs Hinkson prior to this new oxygen order.           Action/Plan:   Expected Discharge Date:  10/31/16               Expected Discharge Plan:     In-House Referral:     Discharge planning Services     Post Acute Care Choice:    Choice offered to:     DME Arranged:    DME Agency:     HH Arranged:    HH Agency:     Status of Service:     If discussed at [employer], dates discussed:    Additional Comments:  Sidra Oldfield A, RN 10/31/2016, 2:41 PM

## 2016-10-31 NOTE — Discharge Summary (Signed)
Natasha [name], is a 83 y.o. female  DOB [birth year]  MRN [id number].  Admission date:  10/29/2016  Admitting Physician  Epifanio Lesches, MD  Discharge Date:  10/31/2016   Primary MD  Rusty Aus, MD  Recommendations for primary care physician for things to follow:   Follow-up with PCP in one week   Admission Diagnosis  Right hip pain [M25.551] Sciatica, unspecified laterality [M54.30]   Discharge Diagnosis  Right hip pain [M25.551] Sciatica, unspecified laterality [M54.30]    Active Problems:   Back pain      Past Medical History:  Diagnosis Date  . Anemia   . Arthritis   . Asthma   . Bilateral pneumonia may 2017  . Bronchitis   . Cardiomegaly   . Chronic respiratory failure with hypoxia (Natasha [name])   . Cochlear implant in place    bilateral, Natasha [name], 04/26/2007   . Gallstones 2015  . GERD (gastroesophageal reflux disease)   . H/O blood clots 2014  . History of home oxygen therapy    at night  . Hypertension   . Hypokalemia   . Peripheral vascular disease (Natasha [name])    with arterial clots- on xarelto  . Polyneuropathy   . Renal disorder    Chronic Kidney Disease, Stage 5  . Restless leg syndrome     Past Surgical History:  Procedure Laterality Date  . ABDOMINAL HYSTERECTOMY  1960  . APPENDECTOMY  1946  . BREAST BIOPSY Bilateral    negative  . CHOLECYSTECTOMY  04-24-13  . COCHLEAR IMPLANT  2009  . COLONOSCOPY  2015   Dr. Rayann Heman   . EYE SURGERY  2008,2009   cataract  . KYPHOPLASTY N/A 08/19/2015   Procedure: KYPHOPLASTY;  Surgeon: Hessie Knows, MD;  Location: [facility];  Service: Orthopedics;  Laterality: N/A;  . PERIPHERAL VASCULAR CATHETERIZATION N/A 02/27/2015   Procedure: Abdominal Aortogram w/Lower Extremity;  Surgeon: Algernon Huxley, MD;  Location: [place] [facility];  Service:  Cardiovascular;  Laterality: N/A;  . PERIPHERAL VASCULAR CATHETERIZATION  02/27/2015   Procedure: Lower Extremity Intervention;  Surgeon: Algernon Huxley, MD;  Location: [place] [facility];  Service: Cardiovascular;;  . PERIPHERAL VASCULAR CATHETERIZATION Left 02/28/2015   Procedure: Lower Extremity Angiography;  Surgeon: Algernon Huxley, MD;  Location: [place] [facility];  Service: Cardiovascular;  Laterality: Left;  . PERIPHERAL VASCULAR CATHETERIZATION  02/28/2015   Procedure: Lower Extremity Intervention;  Surgeon: Algernon Huxley, MD;  Location: [place] [facility];  Service: Cardiovascular;;  . PERIPHERAL VASCULAR CATHETERIZATION Left 05/22/2015   Procedure: Lower Extremity Angiography;  Surgeon: Algernon Huxley, MD;  Location: [place] [facility];  Service: Cardiovascular;  Laterality: Left;  . PERIPHERAL VASCULAR CATHETERIZATION  05/22/2015   Procedure: Lower Extremity Intervention;  Surgeon: Algernon Huxley, MD;  Location: [place] [facility];  Service: Cardiovascular;;  . PERIPHERAL VASCULAR CATHETERIZATION Left 05/23/2015   Procedure: Lower Extremity Angiography;  Surgeon: Algernon Huxley, MD;  Location: [place] [facility];  Service: Cardiovascular;  Laterality: Left;  . PERIPHERAL VASCULAR CATHETERIZATION  05/23/2015   Procedure: Lower Extremity Intervention;  Surgeon: Algernon Huxley, MD;  Location: [place] [facility];  Service: Cardiovascular;;  . stent placement  2006-2014   multiple stent placements in legs       History of present illness and  Hospital Course:     Kindly see H&P for history of present illness and admission details, please review complete Labs, Consult reports and  Test reports for all details in brief  HPI  from the history and physical done on the day of admission 83 year old female patient [facility] [place] [facility] because of back pain requiring the right leg. Admitted for lumbar radiculopathy with the right leg pain.   Hospital Course  #1 acute on  chronic low back pain with the lumbar radiculopathy, back pain going down the right leg: Admitted for the same, received a IV pain medicines, physical therapy, Neurontin. Seen by physical therapy today patient is able to walk with physical therapy, physical therapy recommended home health physical therapy, however oxygen saturation dropped to 85% on ambulation on on 2 L, and the 93% at rest. She uses oxygen at night. We will arrange oxygen during daytime and also from now on. #2. Essential hypertension: Continue torsemide 20 MG twice a day. #3 history of coronary artery disease, CABG 1 year ago. Continue aspirin, Plavix, statins, chronic pain with the bypass graft, continue Neurontin, Percocet #4 history of COPD: No wheezing, continue home dose inhalers, oxygen.    Discharge Condition: stable   Follow UP  Follow-up Information    Rusty Aus, MD Follow up in 1 week(s).   Specialty:  Internal Medicine Contact information: [place] [address] [phone]             Discharge Instructions  and  Discharge Medications     Discharge Instructions    Face-to-face encounter (required for Medicare/Medicaid patients)    Complete by:  As directed    I Natasha [name] certify that this patient is under my care and that I, or a nurse practitioner or physician's assistant working with me, had a face-to-face encounter that meets the physician face-to-face encounter requirements with this patient on 10/31/2016. The encounter with the patient was in whole, or in part for the following medical condition(s) which is the primary reason for home health care  hthn Cads/p cabg Low back pain with right lumbar radiculopathy   The encounter with the patient was in whole, or in part, for the following medical condition, which is the primary reason for home health care:  whole   I certify that, based on my findings, the following services  are medically necessary home health services:   Nursing Physical therapy     Reason for Medically Necessary Home Health Services:  Therapy- Personnel officer, Public librarian   My clinical findings support the need for the above services:  Pain interferes with ambulation/mobility   Further, I certify that my clinical findings support that this patient is homebound due to:  Pain interferes with ambulation/mobility   Home Health    Complete by:  As directed    To provide the following care/treatments:   PT Social work [facility]       Allergies as of 10/31/2016      Reactions   Codeine Diarrhea   Patient can tolerate small amounts of OXYCODONE per patient info sheet   Hydrocodone Nausea And Vomiting   Patient can tolerate small amounts of OXYCODONE per patient info sheet   Levofloxacin Other (See Comments)   Muscle cramps   Tramadol Nausea And Vomiting      Medication List    STOP taking these medications   bumetanide 1 MG tablet Commonly known as:  BUMEX   doxycycline 100 MG tablet Commonly known as:  VIBRA-TABS   oxyCODONE 5 MG immediate release tablet Commonly  known as:  ROXICODONE     TAKE these medications   albuterol 108 (90 Base) MCG/ACT inhaler Commonly known as:  PROVENTIL HFA;VENTOLIN HFA Inhale 2 puffs into the lungs 2 (two) times daily as needed for wheezing or shortness of breath.   albuterol (2.5 MG/3ML) 0.083% nebulizer solution Commonly known as:  PROVENTIL Take 3 mLs (2.5 mg total) by nebulization every 6 (six) hours as needed for wheezing or shortness of breath.   aspirin EC 325 MG tablet Take 325 mg by mouth at bedtime.   budesonide 180 MCG/ACT inhaler Commonly known as:  PULMICORT Inhale 2 puffs into the lungs 2 (two) times daily.   CALCIUM 1200 1200-1000 MG-UNIT Chew Chew 1 tablet by mouth daily.   clopidogrel 75 MG tablet Commonly known as:  PLAVIX Take 75 mg by mouth daily.   diltiazem 180 MG 24 hr  capsule Commonly known as:  CARDIZEM CD Take 180 mg by mouth every morning.   gabapentin 100 MG capsule Commonly known as:  NEURONTIN Take 100 mg by mouth 4 (four) times daily.   GLUCOS-CHONDROIT-MSM COMPLEX PO Take by mouth.   Melatonin 5 MG Tabs Take 5 mg by mouth at bedtime.   montelukast 10 MG tablet Commonly known as:  SINGULAIR Take 10 mg by mouth every morning.   multivitamin with minerals tablet Take 1 tablet by mouth daily.   NEXIUM PO Take 22.3 mg by mouth 2 (two) times daily.   oxyCODONE-acetaminophen 5-325 MG tablet Commonly known as:  PERCOCET/ROXICET Take 1 tablet by mouth every 4 (four) hours as needed for moderate pain or severe pain.   potassium chloride SA 20 MEQ tablet Commonly known as:  K-DUR,KLOR-CON Take 1 tablet (20 mEq total) by mouth See admin instructions. Take 2 tablets (40 mEq) by mouth every morning, Take 20 mEq by mouth every afternoon, and Take 2 tablets (40 mEq) by mouth every night at bedtime.   pramipexole 0.5 MG tablet Commonly known as:  MIRAPEX Take 0.5 mg by mouth 2 (two) times daily.   simvastatin 20 MG tablet Commonly known as:  ZOCOR Take 20 mg by mouth at bedtime.   torsemide 20 MG tablet Commonly known as:  DEMADEX Take 1 tablet (20 mg total) by mouth 2 (two) times daily. What changed:  when to take this            Durable Medical Equipment        Start     Ordered   10/31/16 1331  For home use only DME oxygen  Once    Question Answer Comment  Mode or (Route) Nasal cannula   Liters per Minute 2   Oxygen conserving device Yes   Oxygen delivery system Gas      10/31/16 1330        Diet and Activity recommendation: See Discharge Instructions above   Consults obtained -none   Major procedures and Radiology Reports - PLEASE review detailed and final reports for all details, in brief -     Dg Lumbar Spine Complete  Result Date: 10/29/2016 CLINICAL DATA:  Right hip and low back pain for 1 week. EXAM:  LUMBAR SPINE - COMPLETE 4+ VIEW COMPARISON:  08/24/2015 FINDINGS: Bones are demineralized. Convex rightward lumbar scoliosis evident. Patient is status post vertebral augmentation at L4. Diffuse loss of disc height noted in the lumbar spine with associated diffuse endplate degeneration/spurring. No gross fracture or subluxation evident although subtle fractures could be obscured due to the superimposition of bony anatomy related  to scoliosis. SI joints are unremarkable. IMPRESSION: Stable exam. Scoliosis with diffuse degenerative disc and endplate changes. Prior vertebral augmentation at L4. Electronically Signed   By: Misty Stanley M.D.   On: 10/29/2016 14:37   Dg Hip Unilat W Or Wo Pelvis 2-3 Views Right  Result Date: 10/29/2016 CLINICAL DATA:  Right hip pain EXAM: DG HIP (WITH OR WITHOUT PELVIS) 2-3V RIGHT COMPARISON:  None. FINDINGS: Bones are osteopenic. No fracture or dislocation of the right hip. There are multiple vascular clips overlying the pelvis and bilateral femoral artery stents. IMPRESSION: No acute abnormality of the right hip. Electronically Signed   By: Ulyses Jarred M.D.   On: 10/29/2016 14:34    Micro Results    No results found for this or any previous visit (from the past 240 hour(s)).     Today   Subjective:   Natasha [name] today has no headache,no chest abdominal pain,no new weakness tingling or numbness, feels much better wants to go home today.   Objective:   Blood pressure (!) 112/51, pulse 64, temperature 97.7 F (36.5 C), temperature source Oral, resp. rate 18, height 4\' 11"  (1.499 m), weight 63.5 kg (140 lb 1.6 oz), SpO2 99 %.   Intake/Output Summary (Last 24 hours) at 10/31/16 1331 Last data filed at 10/31/16 0800  Gross per 24 hour  Intake              820 ml  Output              700 ml  Net              120 ml    Exam Awake Alert, Oriented x 3, No new F.N deficits, Normal affect [place].AT,PERRAL Supple Neck,No JVD, No cervical lymphadenopathy  appriciated.  Symmetrical Chest wall movement, Good air movement bilaterally, CTAB RRR,No Gallops,Rubs or new Murmurs, No Parasternal Heave +ve B.Sounds, Abd Soft, Non tender, No organomegaly appriciated, No rebound -guarding or rigidity. No Cyanosis, Clubbing or edema, No new Rash or bruise  Data Review   CBC w Diff: Lab Results  Component Value Date   WBC 7.3 10/29/2016   HGB 13.8 10/29/2016   HGB 9.3 (L) 01/28/2014   HCT 41.3 10/29/2016   HCT 29.2 (L) 01/28/2014   PLT 274 10/29/2016   PLT 255 01/28/2014   LYMPHOPCT 14 10/29/2016   LYMPHOPCT 19.4 01/28/2014   MONOPCT 8 10/29/2016   MONOPCT 10.7 01/28/2014   EOSPCT 1 10/29/2016   EOSPCT 1.3 01/28/2014   BASOPCT 1 10/29/2016   BASOPCT 0.8 01/28/2014    CMP: Lab Results  Component Value Date   NA 136 10/30/2016   NA 140 01/28/2014   K 4.1 10/30/2016   K 3.3 (L) 01/28/2014   CL 98 (L) 10/30/2016   CL 106 01/28/2014   CO2 30 10/30/2016   CO2 25 01/28/2014   BUN 17 10/30/2016   BUN 21 (H) 05/08/2014   CREATININE 0.75 10/30/2016   CREATININE 0.74 05/08/2014   PROT 7.9 10/29/2016   PROT 4.9 (L) 05/10/2013   ALBUMIN 3.8 10/29/2016   ALBUMIN 2.0 (L) 05/10/2013   BILITOT 0.5 10/29/2016   BILITOT 0.4 05/10/2013   ALKPHOS 186 (H) 10/29/2016   ALKPHOS 63 05/10/2013   AST 21 10/29/2016   AST 67 (H) 05/10/2013   ALT 18 10/29/2016   ALT 12 05/10/2013  .   Total Time in preparing paper work, data evaluation and todays exam - 50 minutes  Governor Matos M.D on 10/31/2016 at 1:31 PM  Note: This dictation was prepared with Dragon dictation along with smaller phrase technology. Any transcriptional errors that result from this process are unintentional.

## 2016-10-31 NOTE — Progress Notes (Signed)
Pt d/c home; d/c instructions reviewed w/ pt; pt understanding was verbalized; IV removed, catheter in tact, gauze dressing applied; all pt questions answered; pt verbalized that all pt belongings were accounted for; home O2 delivered, and pt sent home with home O2; pt left unit via wheelchair accompanied by staff

## 2016-10-31 NOTE — Evaluation (Cosign Needed Addendum)
Physical Therapy Evaluation Patient Details Name: Natasha [name] MRN: [id number] DOB: 1928/08/16 Today's Date: 10/31/2016   History of Present Illness  83 yo female from [place] at [facility] was admitted for back pain, referred to PT.  PMHx: scoliosis, DDD, old L4 compression fracture with vertebroplasty, COPD on O2 at home, cochlear implants, HTN, PN, PVD, CABG, CKD 5  Clinical Impression  Pt is going to her home given her need for HHPT and PLOF being her current status except for needing O2 continuously in the day.  Her MD was contacted to make them aware as was nursing to know that pt has a home O2 setup and is able to navigate on her walker, which was PLOF.  WIll continue acute therapy if pt is held over from her expected DC today to home, and will focus on endurance with her RW and monitoring of O2 sats.    Follow Up Recommendations Home health PT    Equipment Recommendations  None recommended by PT    Recommendations for Other Services       Precautions / Restrictions Precautions Precautions: Back;Fall Precaution Booklet Issued: No Precaution Comments: had previous instructions for spine Restrictions Weight Bearing Restrictions: No      Mobility  Bed Mobility               General bed mobility comments: up in chair when PT arrived  Transfers Overall transfer level: Modified independent Equipment used: Rolling walker (2 wheeled)             General transfer comment: reminders for safety with hand placement  Ambulation/Gait Ambulation/Gait assistance: Min guard Ambulation Distance (Feet): 100 Feet Assistive device: Rolling walker (2 wheeled);1 person hand held assist Gait Pattern/deviations: Step-to pattern;Step-through pattern;Decreased stride length;Wide base of support;Trunk flexed;Shuffle Gait velocity: reduced Gait velocity interpretation: Below normal speed for age/gender General Gait Details: pt was able to turn walker but slow and with  effort  Stairs            Wheelchair Mobility    Modified Rankin (Stroke Patients Only)       Balance Overall balance assessment: Needs assistance Sitting-balance support: Feet supported Sitting balance-Leahy Scale: Good     Standing balance support: Bilateral upper extremity supported;During functional activity Standing balance-Leahy Scale: Fair                               Pertinent Vitals/Pain Pain Assessment: Faces Faces Pain Scale: Hurts little more Pain Location: back  Pain Intervention(s): Monitored during session;Premedicated before session;Repositioned    Home Living Family/patient expects to be discharged to:: Other (Comment)                 Additional Comments: [facility]    Prior Function Level of Independence: Independent with assistive device(s)         Comments: Pt lives with independent 80 y.o. husband.  Uses 4ww in house and RW in community.  Has aide 2x/week for bathing/personal assist; nursing 2x/week for wounds; and HHPT 2x/week.  Uses 2 L home O2 at night.     Hand Dominance        Extremity/Trunk Assessment   Upper Extremity Assessment Upper Extremity Assessment: Overall WFL for tasks assessed    Lower Extremity Assessment Lower Extremity Assessment: Overall WFL for tasks assessed    Cervical / Trunk Assessment Cervical / Trunk Assessment: Kyphotic  Communication   Communication: HOH  Cognition  Arousal/Alertness: Awake/alert Behavior During Therapy: WFL for tasks assessed/performed Overall Cognitive Status: Within Functional Limits for tasks assessed                                        General Comments General comments (skin integrity, edema, etc.): pt was on O2 2L when PT arrived, resting sat was 93% and after walk was 85%.      Exercises     Assessment/Plan    PT Assessment Patient needs continued PT services  PT Problem List Decreased range of  motion;Decreased activity tolerance;Decreased mobility;Cardiopulmonary status limiting activity;Pain       PT Treatment Interventions DME instruction;Gait training;Functional mobility training;Therapeutic activities;Therapeutic exercise;Balance training;Neuromuscular re-education;Patient/family education    PT Goals (Current goals can be found in the Care Plan section)  Acute Rehab PT Goals Patient Stated Goal: To get home and rest PT Goal Formulation: With patient Time For Goal Achievement: 11/14/16 Potential to Achieve Goals: Good    Frequency Min 2X/week   Barriers to discharge Decreased caregiver support (husband is 16)      Co-evaluation               AM-PAC PT "6 Clicks" Daily Activity  Outcome Measure Difficulty turning over in bed (including adjusting bedclothes, sheets and blankets)?: A Little Difficulty moving from lying on back to sitting on the side of the bed? : A Little Difficulty sitting down on and standing up from a chair with arms (e.g., wheelchair, bedside commode, etc,.)?: A Little Help needed moving to and from a bed to chair (including a wheelchair)?: A Little Help needed walking in hospital room?: A Little Help needed climbing 3-5 steps with a railing? : A Lot 6 Click Score: 17    End of Session Equipment Utilized During Treatment: Gait belt;Oxygen Activity Tolerance: Patient tolerated treatment well;Treatment limited secondary to medical complications (Comment) (low O2 sats with effort) Patient left: in chair;with call bell/phone within reach Nurse Communication: Mobility status PT Visit Diagnosis: Other abnormalities of gait and mobility (R26.89);Other (comment) (oxygen saturation declines with mobility from COPD)    Time: 1210-1245 PT Time Calculation (min) (ACUTE ONLY): 35 min   Charges:   PT Evaluation $PT Eval Moderate Complexity: 1 Mod     PT G Codes:   PT G-Codes **NOT FOR INPATIENT CLASS** Functional Assessment Tool Used: AM-PAC 6  Clicks Basic Mobility    Ramond Dial 10/31/2016, 1:20 PM   Mee Hives, PT MS Acute Rehab Dept. Number: [facility] [phone] and [facility] [phone]   Addended 11/05/16 to request co-signature from ordering physician. Dailyn Kempner H. Owens Shark, PT, DPT, NCS 11/05/16, 10:02 AM [phone]

## 2016-11-04 ENCOUNTER — Other Ambulatory Visit: Payer: Self-pay | Admitting: Internal Medicine

## 2016-11-04 DIAGNOSIS — M5116 Intervertebral disc disorders with radiculopathy, lumbar region: Secondary | ICD-10-CM

## 2016-11-05 NOTE — Progress Notes (Signed)
10/31/16 1200  PT Visit Information  Last PT Received On 10/31/16  Assistance Needed +1  History of [place] Illness 83 [place] [facility] was admitted for back pain, referred to PT.  PMHx: scoliosis, DDD, old L4 compression fracture with vertebroplasty, COPD on O2 at home, cochlear implants, HTN, PN, PVD, CABG, CKD 5  Precautions  Precautions Back;Fall  Precaution Booklet Issued No  Precaution Comments had previous instructions for spine  Restrictions  Weight Bearing Restrictions No  Home Living  Family/patient expects to be discharged to: Other (Comment)  Additional Comments [facility]  Prior Function  Level of Independence Independent with assistive device(s)  Comments Pt lives with independent 37 y.o. husband.  Uses 4ww in house and RW in community.  Has aide 2x/week for bathing/personal assist; nursing 2x/week for wounds; and HHPT 2x/week.  Uses 2 L home O2 at night.  Communication  Communication HOH  Pain Assessment  Pain Assessment Faces  Faces Pain Scale 4  Pain Location back   Pain Intervention(s) Monitored during session;Premedicated before session;Repositioned  Cognition  Arousal/Alertness Awake/alert  Behavior During Therapy WFL for tasks assessed/performed  Overall Cognitive Status Within Functional Limits for tasks assessed  Upper Extremity Assessment  Upper Extremity Assessment Overall WFL for tasks assessed  Lower Extremity Assessment  Lower Extremity Assessment Overall WFL for tasks assessed  Cervical / Trunk Assessment  Cervical / Trunk Assessment Kyphotic  Bed Mobility  General bed mobility comments up in chair when PT arrived  Transfers  Overall transfer level Modified independent  Equipment used Rolling walker (2 wheeled)  General transfer comment reminders for safety with hand placement  Ambulation/Gait  Ambulation/Gait assistance Min guard  Ambulation Distance (Feet) 100 Feet  Assistive device Rolling walker (2  wheeled);1 person hand held assist  Gait Pattern/deviations Step-to pattern;Step-through pattern;Decreased stride length;Wide base of support;Trunk flexed;Shuffle  General Gait Details pt was able to turn walker but slow and with effort  Gait velocity reduced  Gait velocity interpretation Below normal speed for age/gender  Balance  Overall balance assessment Needs assistance  Sitting-balance support Feet supported  Sitting balance-Leahy Scale Good  Standing balance support Bilateral upper extremity supported;During functional activity  Standing balance-Leahy Scale Fair  General Comments  General comments (skin integrity, edema, etc.) pt was on O2 2L when PT arrived, resting sat was 93% and after walk was 85%.    PT - End of Session  Equipment Utilized During Treatment Gait belt;Oxygen  Activity Tolerance Patient tolerated treatment well;Treatment limited secondary to medical complications (Comment) (low O2 sats with effort)  Patient left in chair;with call bell/phone within reach  Nurse Communication Mobility status  PT Assessment  PT Recommendation/Assessment Patient needs continued PT services  PT Visit Diagnosis Other abnormalities of gait and mobility (R26.89);Other (comment) (oxygen saturation declines with mobility from COPD)  PT Problem List Decreased range of motion;Decreased activity tolerance;Decreased mobility;Cardiopulmonary status limiting activity;Pain  Barriers to Discharge Decreased caregiver support (husband is 42)  PT Plan  PT Frequency (ACUTE ONLY) Min 2X/week  PT Treatment/Interventions (ACUTE ONLY) DME instruction;Gait training;Functional mobility training;Therapeutic activities;Therapeutic exercise;Balance training;Neuromuscular re-education;Patient/family education  AM-PAC PT "6 Clicks" Daily Activity Outcome Measure  Difficulty turning over in bed (including adjusting bedclothes, sheets and blankets)? 3  Difficulty moving from lying on back to sitting on the  side of the bed?  3  Difficulty sitting down on and standing up from a chair with arms (e.g., wheelchair, bedside commode, etc,.)? 3  Help needed moving to and  from a bed to chair (including a wheelchair)? 3  Help needed walking in hospital room? 3  Help needed climbing 3-5 steps with a railing?  2  6 Click Score 17  Mobility G Code  CK  PT Recommendation  Follow Up Recommendations Home health PT  PT equipment None recommended by PT  Individuals Consulted  Consulted and Agree with Results and Recommendations Patient  Acute Rehab PT Goals  Patient Stated Goal To get home and rest  PT Goal Formulation With patient  Time For Goal Achievement 11/14/16  Potential to Achieve Goals Good  PT Time Calculation  PT Start Time (ACUTE ONLY) 1210  PT Stop Time (ACUTE ONLY) 1245  PT Time Calculation (min) (ACUTE ONLY) 35 min  PT G-Codes **NOT FOR INPATIENT CLASS**  Functional Assessment Tool Used AM-PAC 6 Clicks Basic Mobility  Functional Limitation Mobility: Walking and moving around  Mobility: Walking and Moving Around Current Status (M3536) CK  Mobility: Walking and Moving Around Goal Status (R4431) CI  PT General Charges  $$ ACUTE PT VISIT 1 Visit  PT Evaluation  $PT Eval Moderate Complexity 1 Mod    Late entry g-codes added after review of initial evaluation/documentation by Mee Hives, PT.  Reyes Ivan. Owens Shark, PT, DPT, NCS 11/05/16, 10:01 AM [phone]

## 2016-11-11 ENCOUNTER — Ambulatory Visit
Admission: RE | Admit: 2016-11-11 | Discharge: 2016-11-11 | Disposition: A | Discharge status: Home / Self Care | Payer: Medicare Other | Source: Ambulatory Visit | Attending: Internal Medicine | Admitting: Internal Medicine

## 2016-11-11 DIAGNOSIS — S3210XA Unspecified fracture of sacrum, initial encounter for closed fracture: Secondary | ICD-10-CM | POA: Diagnosis not present

## 2016-11-11 DIAGNOSIS — Z8781 Personal history of (healed) traumatic fracture: Secondary | ICD-10-CM | POA: Insufficient documentation

## 2016-11-11 DIAGNOSIS — S32301A Unspecified fracture of right ilium, initial encounter for closed fracture: Secondary | ICD-10-CM | POA: Diagnosis not present

## 2016-11-11 DIAGNOSIS — M8580 Other specified disorders of bone density and structure, unspecified site: Secondary | ICD-10-CM | POA: Insufficient documentation

## 2016-11-11 DIAGNOSIS — X58XXXA Exposure to other specified factors, initial encounter: Secondary | ICD-10-CM | POA: Insufficient documentation

## 2016-11-11 DIAGNOSIS — M5116 Intervertebral disc disorders with radiculopathy, lumbar region: Secondary | ICD-10-CM | POA: Insufficient documentation

## 2016-11-11 DIAGNOSIS — M4186 Other forms of scoliosis, lumbar region: Secondary | ICD-10-CM | POA: Insufficient documentation

## 2016-11-15 ENCOUNTER — Observation Stay
Admission: EM | Admit: 2016-11-15 | Discharge: 2016-11-17 | Disposition: A | Discharge status: Home / Self Care | Payer: Medicare Other | Attending: Internal Medicine | Admitting: Internal Medicine

## 2016-11-15 ENCOUNTER — Encounter: Payer: Self-pay | Admitting: Emergency Medicine

## 2016-11-15 ENCOUNTER — Other Ambulatory Visit: Payer: Self-pay

## 2016-11-15 ENCOUNTER — Emergency Department: Payer: Medicare Other

## 2016-11-15 DIAGNOSIS — G629 Polyneuropathy, unspecified: Secondary | ICD-10-CM | POA: Insufficient documentation

## 2016-11-15 DIAGNOSIS — Z881 Allergy status to other antibiotic agents status: Secondary | ICD-10-CM | POA: Diagnosis not present

## 2016-11-15 DIAGNOSIS — K219 Gastro-esophageal reflux disease without esophagitis: Secondary | ICD-10-CM | POA: Diagnosis present

## 2016-11-15 DIAGNOSIS — Z90710 Acquired absence of both cervix and uterus: Secondary | ICD-10-CM | POA: Insufficient documentation

## 2016-11-15 DIAGNOSIS — Z7902 Long term (current) use of antithrombotics/antiplatelets: Secondary | ICD-10-CM | POA: Diagnosis not present

## 2016-11-15 DIAGNOSIS — N185 Chronic kidney disease, stage 5: Secondary | ICD-10-CM | POA: Diagnosis not present

## 2016-11-15 DIAGNOSIS — J45909 Unspecified asthma, uncomplicated: Secondary | ICD-10-CM | POA: Insufficient documentation

## 2016-11-15 DIAGNOSIS — S32309A Unspecified fracture of unspecified ilium, initial encounter for closed fracture: Secondary | ICD-10-CM | POA: Diagnosis not present

## 2016-11-15 DIAGNOSIS — I517 Cardiomegaly: Secondary | ICD-10-CM | POA: Insufficient documentation

## 2016-11-15 DIAGNOSIS — G2581 Restless legs syndrome: Secondary | ICD-10-CM | POA: Diagnosis not present

## 2016-11-15 DIAGNOSIS — W19XXXA Unspecified fall, initial encounter: Secondary | ICD-10-CM | POA: Insufficient documentation

## 2016-11-15 DIAGNOSIS — K449 Diaphragmatic hernia without obstruction or gangrene: Secondary | ICD-10-CM | POA: Diagnosis not present

## 2016-11-15 DIAGNOSIS — I120 Hypertensive chronic kidney disease with stage 5 chronic kidney disease or end stage renal disease: Secondary | ICD-10-CM | POA: Diagnosis not present

## 2016-11-15 DIAGNOSIS — I10 Essential (primary) hypertension: Secondary | ICD-10-CM | POA: Diagnosis present

## 2016-11-15 DIAGNOSIS — S3210XD Unspecified fracture of sacrum, subsequent encounter for fracture with routine healing: Secondary | ICD-10-CM | POA: Diagnosis present

## 2016-11-15 DIAGNOSIS — Z86718 Personal history of other venous thrombosis and embolism: Secondary | ICD-10-CM | POA: Diagnosis not present

## 2016-11-15 DIAGNOSIS — J9610 Chronic respiratory failure, unspecified whether with hypoxia or hypercapnia: Secondary | ICD-10-CM | POA: Diagnosis not present

## 2016-11-15 DIAGNOSIS — S32059A Unspecified fracture of fifth lumbar vertebra, initial encounter for closed fracture: Secondary | ICD-10-CM | POA: Diagnosis not present

## 2016-11-15 DIAGNOSIS — K5730 Diverticulosis of large intestine without perforation or abscess without bleeding: Secondary | ICD-10-CM | POA: Diagnosis not present

## 2016-11-15 DIAGNOSIS — S3210XA Unspecified fracture of sacrum, initial encounter for closed fracture: Principal | ICD-10-CM | POA: Diagnosis present

## 2016-11-15 DIAGNOSIS — Z7982 Long term (current) use of aspirin: Secondary | ICD-10-CM | POA: Diagnosis not present

## 2016-11-15 DIAGNOSIS — Z87891 Personal history of nicotine dependence: Secondary | ICD-10-CM | POA: Insufficient documentation

## 2016-11-15 DIAGNOSIS — Z79899 Other long term (current) drug therapy: Secondary | ICD-10-CM | POA: Insufficient documentation

## 2016-11-15 DIAGNOSIS — Z885 Allergy status to narcotic agent status: Secondary | ICD-10-CM | POA: Insufficient documentation

## 2016-11-15 DIAGNOSIS — Z23 Encounter for immunization: Secondary | ICD-10-CM | POA: Diagnosis not present

## 2016-11-15 LAB — CBC WITH DIFFERENTIAL/PLATELET
Basophils Absolute: 0.1 10*3/uL (ref 0–0.1)
Basophils Relative: 1 %
EOS PCT: 1 %
Eosinophils Absolute: 0.1 10*3/uL (ref 0–0.7)
HCT: 40.2 % (ref 35.0–47.0)
Hemoglobin: 13.4 g/dL (ref 12.0–16.0)
LYMPHS ABS: 1.1 10*3/uL (ref 1.0–3.6)
LYMPHS PCT: 15 %
MCH: 31.2 pg (ref 26.0–34.0)
MCHC: 33.2 g/dL (ref 32.0–36.0)
MCV: 94 fL (ref 80.0–100.0)
MONO ABS: 0.7 10*3/uL (ref 0.2–0.9)
MONOS PCT: 9 %
Neutro Abs: 5.7 10*3/uL (ref 1.4–6.5)
Neutrophils Relative %: 74 %
PLATELETS: 372 10*3/uL (ref 150–440)
RBC: 4.28 MIL/uL (ref 3.80–5.20)
RDW: 15.9 % — AB (ref 11.5–14.5)
WBC: 7.8 10*3/uL (ref 3.6–11.0)

## 2016-11-15 LAB — COMPREHENSIVE METABOLIC PANEL
ALBUMIN: 3.8 g/dL (ref 3.5–5.0)
ALT: 16 U/L (ref 14–54)
AST: 26 U/L (ref 15–41)
Alkaline Phosphatase: 232 U/L — ABNORMAL HIGH (ref 38–126)
Anion gap: 12 (ref 5–15)
BILIRUBIN TOTAL: 0.4 mg/dL (ref 0.3–1.2)
BUN: 28 mg/dL — AB (ref 6–20)
CHLORIDE: 97 mmol/L — AB (ref 101–111)
CO2: 29 mmol/L (ref 22–32)
Calcium: 10 mg/dL (ref 8.9–10.3)
Creatinine, Ser: 0.79 mg/dL (ref 0.44–1.00)
GFR calc Af Amer: >60 mL/min (ref >60)
GFR calc non Af Amer: >60 mL/min (ref >60)
GLUCOSE: 97 mg/dL (ref 65–99)
POTASSIUM: 4.1 mmol/L (ref 3.5–5.1)
Sodium: 138 mmol/L (ref 135–145)
TOTAL PROTEIN: 7.9 g/dL (ref 6.5–8.1)

## 2016-11-15 LAB — PROTIME-INR
INR: 0.94
Prothrombin Time: 12.5 seconds (ref 11.4–15.2)

## 2016-11-15 MED ORDER — MORPHINE SULFATE (PF) 2 MG/ML IV SOLN
2.0000 mg | INTRAVENOUS | Status: DC | PRN
  Administered 2016-11-16 (×2): 2 mg via INTRAVENOUS
  Filled 2016-11-15 (×2): qty 1

## 2016-11-15 MED ORDER — OXYCODONE-ACETAMINOPHEN 5-325 MG PO TABS
1.0000 | ORAL_TABLET | ORAL | Status: DC | PRN
  Administered 2016-11-16 (×2): 1 via ORAL
  Filled 2016-11-15 (×2): qty 1

## 2016-11-15 MED ORDER — ACETAMINOPHEN 325 MG PO TABS: 650.0000 mg | ORAL_TABLET | Freq: Four times a day (QID) | ORAL | Status: DC | PRN

## 2016-11-15 MED ORDER — ACETAMINOPHEN 650 MG RE SUPP: 650.0000 mg | Freq: Four times a day (QID) | RECTAL | Status: DC | PRN

## 2016-11-15 NOTE — ED Provider Notes (Signed)
[facility] Emergency Department Provider Note  ____________________________________________   First MD Initiated Contact with Patient 11/15/16 1748     (approximate)  I have reviewed the triage vital signs and the nursing notes.   HISTORY  Chief Complaint Hip Injury    HPI Natasha [name] is a 83 y.o. female who was sent to the emergency department by her primary care physician for reported bilateral hip fractures.  Unfortunately I am unable to visualize the reports from the patient's outpatient CT scan nor unable to see her primary care physician's note.  According to the patient she fell onto her left hip about 2-3 weeks ago and has had difficulty ambulating ever since.  She had an outpatient CT scan performed 5 days ago and apparently the read came back today suggestive of bilateral hip fractures.  She has been unable to bear weight for the past 2 weeks.  She is in mild to moderate pain worse when ambulating improved with rest.  Past Medical History:  Diagnosis Date  . Anemia   . Arthritis   . Asthma   . Bilateral pneumonia may 2017  . Bronchitis   . Cardiomegaly   . Chronic respiratory failure with hypoxia ([place])   . Cochlear implant in place    bilateral, [name], 04/26/2007   . Gallstones 2015  . GERD (gastroesophageal reflux disease)   . H/O blood clots 2014  . History of home oxygen therapy    at night  . Hypertension   . Hypokalemia   . Peripheral vascular disease ([place])    with arterial clots- on xarelto  . Polyneuropathy   . Renal disorder    Chronic Kidney Disease, Stage 5  . Restless leg syndrome     Patient Active Problem List   Diagnosis Date Noted  . Back pain 10/29/2016  . CAP (community acquired pneumonia) 03/27/2016  . Cellulitis 03/27/2016  . Hypokalemia 03/27/2016  . Acute respiratory failure ([place]) 03/27/2016  . Sepsis ([place]) 08/24/2015  . HCAP (healthcare-associated pneumonia) 06/27/2015  . Pneumonia  06/07/2015  . Hypotension 03/11/2015  . Lower extremity atheroembolism ([place]) 02/27/2015  . Ischemic leg 02/27/2015  . Calculus of gallbladder with other cholecystitis, without mention of obstruction 04/20/2013    Past Surgical History:  Procedure Laterality Date  . ABDOMINAL HYSTERECTOMY  1960  . APPENDECTOMY  1946  . BREAST BIOPSY Bilateral    negative  . CHOLECYSTECTOMY  04-24-13  . COCHLEAR IMPLANT  2009  . COLONOSCOPY  2015   Dr. Rayann Heman   . EYE SURGERY  2008,2009   cataract  . stent placement  2006-2014   multiple stent placements in legs    Prior to Admission medications   Medication Sig Start Date End Date Taking? Authorizing Provider  albuterol (PROVENTIL HFA;VENTOLIN HFA) 108 (90 BASE) MCG/ACT inhaler Inhale 2 puffs into the lungs 2 (two) times daily as needed for wheezing or shortness of breath.    Yes [provider]  aspirin EC 325 MG tablet Take 325 mg by mouth at bedtime.    Yes [provider]  budesonide (PULMICORT) 180 MCG/ACT inhaler Inhale 2 puffs into the lungs 2 (two) times daily.   Yes [provider]  Calcium Carbonate-Vit D-Min (CALCIUM 1200) 1200-1000 MG-UNIT CHEW Chew 1 tablet by mouth daily.   Yes [provider]  clopidogrel (PLAVIX) 75 MG tablet Take 75 mg by mouth daily.   Yes [provider]  diltiazem (CARDIZEM CD) 180 MG 24 hr  capsule Take 180 mg by mouth every morning.    Yes [provider]  Esomeprazole Magnesium (NEXIUM PO) Take 22.3 mg by mouth 2 (two) times daily.    Yes [provider]  gabapentin (NEURONTIN) 100 MG capsule Take 100 mg by mouth 4 (four) times daily.   Yes [provider]  Melatonin 5 MG TABS Take 5 mg by mouth at bedtime.    Yes [provider]  Misc Natural Products (GLUCOS-CHONDROIT-MSM COMPLEX PO) Take by mouth.   Yes [provider]  montelukast (SINGULAIR) 10 MG tablet Take 10 mg by mouth every morning.    Yes [provider]    Multiple Vitamins-Minerals (MULTIVITAMIN WITH MINERALS) tablet Take 1 tablet by mouth daily.   Yes [provider]  oxyCODONE-acetaminophen (PERCOCET/ROXICET) 5-325 MG tablet Take 1 tablet by mouth every 4 (four) hours as needed for moderate pain or severe pain. 10/31/16  Yes Epifanio Lesches, MD  potassium chloride SA (K-DUR,KLOR-CON) 20 MEQ tablet Take 1 tablet (20 mEq total) by mouth See admin instructions. Take 2 tablets (40 mEq) by mouth every morning, Take 20 mEq by mouth every afternoon, and Take 2 tablets (40 mEq) by mouth every night at bedtime. 03/31/16  Yes Wieting, Richard, MD  pramipexole (MIRAPEX) 0.5 MG tablet Take 0.5 mg by mouth 2 (two) times daily. 06/30/15  Yes [provider]  simvastatin (ZOCOR) 20 MG tablet Take 20 mg by mouth at bedtime. 07/03/15  Yes [provider]  torsemide (DEMADEX) 20 MG tablet Take 1 tablet (20 mg total) by mouth 2 (two) times daily. 10/31/16  Yes Epifanio Lesches, MD  albuterol (PROVENTIL) (2.5 MG/3ML) 0.083% nebulizer solution Take 3 mLs (2.5 mg total) by nebulization every 6 (six) hours as needed for wheezing or shortness of breath. Patient not taking: Reported on 10/29/2016 06/11/15   Loletha Grayer, MD    Allergies Codeine; Hydrocodone; Levofloxacin; and Tramadol  Family History  Problem Relation Age of Onset  . Heart disease Mother   . Heart disease Father   . Cancer Sister        lung  . Breast cancer Daughter     Social History Social History   Tobacco Use  . Smoking status: Former Smoker    Packs/day: 1.00    Years: 25.00    Pack years: 25.00    Types: Cigarettes  . Smokeless tobacco: Never Used  Substance Use Topics  . Alcohol use: No  . Drug use: No    Review of Systems Constitutional: No fever/chills Eyes: No visual changes. ENT: No sore throat. Cardiovascular: Denies chest pain. Respiratory: Denies shortness of breath. Gastrointestinal: No abdominal pain.  No nausea, no vomiting.   No diarrhea.  No constipation. Genitourinary: Negative for dysuria. Musculoskeletal: Positive for back pain. Skin: Negative for rash. Neurological: Negative for headaches, focal weakness or numbness.   ____________________________________________   PHYSICAL EXAM:  VITAL SIGNS: ED Triage Vitals  Enc Vitals Group     BP 11/15/16 1742 109/61     Pulse Rate 11/15/16 1742 87     Resp 11/15/16 1742 18     Temp 11/15/16 1742 99 F (37.2 C)     Temp Source 11/15/16 1742 Oral     SpO2 11/15/16 1742 93 %     Weight 11/15/16 1744 140 lb (63.5 kg)     Height 11/15/16 1744 4\' 11"  (1.499 m)     Head Circumference --      Peak Flow --  Pain Score 11/15/16 1743 8     Pain Loc --      Pain Edu? --      Excl. in [place]? --     Constitutional: Alert and oriented x4 chronically ill-appearing nontoxic no diaphoresis speaks in full clear sentences Eyes: PERRL EOMI. Head: Atraumatic. Nose: No congestion/rhinnorhea. Mouth/Throat: No trismus Neck: No stridor.   Cardiovascular: Normal rate, regular rhythm. Grossly normal heart sounds.  Good peripheral circulation. Respiratory: Normal respiratory effort.  No retractions. Lungs CTAB and moving good air Gastrointestinal: Soft nontender Musculoskeletal: Exquisite discomfort when ranging left hip unable to internally or externally rotate without discomfort Neurologic:  Normal speech and language. No gross focal neurologic deficits are appreciated. Skin:  Skin is warm, dry and intact. No rash noted. Psychiatric: Mood and affect are normal. Speech and behavior are normal.    ____________________________________________   DIFFERENTIAL includes but not limited to  Hip fracture, hip dislocation, femur fracture ____________________________________________   LABS (all labs ordered are listed, but only abnormal results are displayed)  Labs Reviewed  COMPREHENSIVE METABOLIC PANEL - Abnormal; Notable for the following components:      Result Value    Chloride 97 (*)    BUN 28 (*)    Alkaline Phosphatase 232 (*)    All other components within normal limits  CBC WITH DIFFERENTIAL/PLATELET - Abnormal; Notable for the following components:   RDW 15.9 (*)    All other components within normal limits  PROTIME-INR  TYPE AND SCREEN    Blood work reviewed and interpreted by me with no acute disease aside from elevated alkaline phosphatase consistent with healing fractures __________________________________________  EKG   ____________________________________________  RADIOLOGY  CT pelvis reviewed by me with no acute findings per chronic fractures ____________________________________________   PROCEDURES  Procedure(s) performed: no  Procedures  Critical Care performed: no  Observation: no ____________________________________________   INITIAL IMPRESSION / ASSESSMENT AND PLAN / ED COURSE  Pertinent labs & imaging results that were available during my care of the patient were reviewed by me and considered in my medical decision making (see chart for details).  The patient arrives hemodynamically stable and quite well-appearing.  Unfortunately I am unable to see reports or images of her outpatient CT scan so she will require a new CT today.     ----------------------------------------- 7:52 PM on 11/15/2016 -----------------------------------------  The patient CT scan shows no acute fractures.  She still is unable to bear weight on the left and has exquisite discomfort when ranging her left hip.  I would normally progress onto an MRI now looking for an occult fracture, however the patient has a cochlear implant and cannot have one.  I discussed the case with on-call orthopedic surgeon Dr. Harlow Mares who recommends that the patient be partial weightbearing with a walker and she should get a follow-up x-ray in 1 week.  The patient is unable to care for herself and her husband is 69 years old and unable to care for her at home.   She cannot walk.  At this point it is unsafe to discharge her home and she will require social work and physical therapy consultation for possible nursing home placement. ____________________________________________  ----------------------------------------- 11:12 PM on 11/15/2016 -----------------------------------------  I discussed the case with the hospitalist Dr. Jannifer Franklin who is graciously agreed to admit the patient to his service.   FINAL CLINICAL IMPRESSION(S) / ED DIAGNOSES  Final diagnoses:  Closed fracture of sacrum with routine healing, unspecified portion of sacrum, subsequent encounter  NEW MEDICATIONS STARTED DURING THIS VISIT:  This SmartLink is deprecated. Use AVSMEDLIST instead to display the medication list for a patient.   Note:  This document was prepared using Dragon voice recognition software and may include unintentional dictation errors.     Darel Hong, MD 11/15/16 2312

## 2016-11-15 NOTE — ED Notes (Signed)
First Nurse: sent over from [facility] for further eval of possible left hip fracture.

## 2016-11-15 NOTE — ED Notes (Signed)
Admitting MD at bedside.

## 2016-11-15 NOTE — ED Notes (Signed)
Per Dr. Jannifer Franklin, ok for patient to eat & drink. Pt given ice water at this time but does not want anything to eat.

## 2016-11-15 NOTE — H&P (Signed)
[facility] [place] NAME: Natasha [name]    MR#:  [id number]  DATE OF BIRTH:  Dec 10, 1928  DATE OF ADMISSION:  11/15/2016  PRIMARY CARE PHYSICIAN: Rusty Aus, MD   REQUESTING/REFERRING PHYSICIAN: Mable Paris, MD  CHIEF COMPLAINT:   Chief Complaint  Patient presents with  . Hip Injury    HISTORY OF PRESENT ILLNESS:  Natasha [name]  is a 83 y.o. female who presents with pain in her right hip and left lower leg.  Patient fell several days ago and has had difficulty ambulating since that time.  She had imaging done in the outpatient setting which was concerning reportedly for hip fractures.  We were unable to access this imaging here, and repeat imaging in our ED showed right sacral and ischial spine fractures as well as L5 transverse process fracture.  Given her advanced age, the fact that she does not have a caregiver at home who can help her ambulate (her husband is 43), hospitalist were called for admission  PAST MEDICAL HISTORY:   Past Medical History:  Diagnosis Date  . Anemia   . Arthritis   . Asthma   . Bilateral pneumonia may 2017  . Bronchitis   . Cardiomegaly   . Chronic respiratory failure with hypoxia ([place])   . Cochlear implant in place    bilateral, [name], 04/26/2007   . Gallstones 2015  . GERD (gastroesophageal reflux disease)   . H/O blood clots 2014  . History of home oxygen therapy    at night  . Hypertension   . Hypokalemia   . Peripheral vascular disease ([place])    with arterial clots- on xarelto  . Polyneuropathy   . Renal disorder    Chronic Kidney Disease, Stage 5  . Restless leg syndrome     PAST SURGICAL HISTORY:   Past Surgical History:  Procedure Laterality Date  . ABDOMINAL HYSTERECTOMY  1960  . APPENDECTOMY  1946  . BREAST BIOPSY Bilateral    negative  . CHOLECYSTECTOMY  04-24-13  . COCHLEAR IMPLANT  2009  . COLONOSCOPY  2015   Dr. Rayann Heman   . EYE SURGERY  2008,2009    cataract  . stent placement  2006-2014   multiple stent placements in legs    SOCIAL HISTORY:   Social History   Tobacco Use  . Smoking status: Former Smoker    Packs/day: 1.00    Years: 25.00    Pack years: 25.00    Types: Cigarettes  . Smokeless tobacco: Never Used  Substance Use Topics  . Alcohol use: No    FAMILY HISTORY:   Family History  Problem Relation Age of Onset  . Heart disease Mother   . Heart disease Father   . Cancer Sister        lung  . Breast cancer Daughter     DRUG ALLERGIES:   Allergies  Allergen Reactions  . Codeine Diarrhea    Patient can tolerate small amounts of OXYCODONE per patient info sheet  . Hydrocodone Nausea And Vomiting    Patient can tolerate small amounts of OXYCODONE per patient info sheet  . Levofloxacin Other (See Comments)    Muscle cramps  . Tramadol Nausea And Vomiting    MEDICATIONS AT HOME:   Prior to Admission medications   Medication Sig Start Date End Date Taking? Authorizing Provider  albuterol (PROVENTIL HFA;VENTOLIN HFA) 108 (90 BASE) MCG/ACT inhaler Inhale 2 puffs into the lungs 2 (two)  times daily as needed for wheezing or shortness of breath.    Yes [provider]  aspirin EC 325 MG tablet Take 325 mg by mouth at bedtime.    Yes [provider]  budesonide (PULMICORT) 180 MCG/ACT inhaler Inhale 2 puffs into the lungs 2 (two) times daily.   Yes [provider]  Calcium Carbonate-Vit D-Min (CALCIUM 1200) 1200-1000 MG-UNIT CHEW Chew 1 tablet by mouth daily.   Yes [provider]  clopidogrel (PLAVIX) 75 MG tablet Take 75 mg by mouth daily.   Yes [provider]  diltiazem (CARDIZEM CD) 180 MG 24 hr capsule Take 180 mg by mouth every morning.    Yes [provider]  Esomeprazole Magnesium (NEXIUM PO) Take 22.3 mg by mouth 2 (two) times daily.    Yes [provider]  gabapentin (NEURONTIN) 100 MG capsule Take 100 mg by mouth 4 (four) times daily.   Yes  [provider]  Melatonin 5 MG TABS Take 5 mg by mouth at bedtime.    Yes [provider]  Misc Natural Products (GLUCOS-CHONDROIT-MSM COMPLEX PO) Take by mouth.   Yes [provider]  montelukast (SINGULAIR) 10 MG tablet Take 10 mg by mouth every morning.    Yes [provider]  Multiple Vitamins-Minerals (MULTIVITAMIN WITH MINERALS) tablet Take 1 tablet by mouth daily.   Yes [provider]  oxyCODONE-acetaminophen (PERCOCET/ROXICET) 5-325 MG tablet Take 1 tablet by mouth every 4 (four) hours as needed for moderate pain or severe pain. 10/31/16  Yes Epifanio Lesches, MD  potassium chloride SA (K-DUR,KLOR-CON) 20 MEQ tablet Take 1 tablet (20 mEq total) by mouth See admin instructions. Take 2 tablets (40 mEq) by mouth every morning, Take 20 mEq by mouth every afternoon, and Take 2 tablets (40 mEq) by mouth every night at bedtime. 03/31/16  Yes Wieting, Richard, MD  pramipexole (MIRAPEX) 0.5 MG tablet Take 0.5 mg by mouth 2 (two) times daily. 06/30/15  Yes [provider]  simvastatin (ZOCOR) 20 MG tablet Take 20 mg by mouth at bedtime. 07/03/15  Yes [provider]  torsemide (DEMADEX) 20 MG tablet Take 1 tablet (20 mg total) by mouth 2 (two) times daily. 10/31/16  Yes Epifanio Lesches, MD  albuterol (PROVENTIL) (2.5 MG/3ML) 0.083% nebulizer solution Take 3 mLs (2.5 mg total) by nebulization every 6 (six) hours as needed for wheezing or shortness of breath. Patient not taking: Reported on 10/29/2016 06/11/15   Loletha Grayer, MD    REVIEW OF SYSTEMS:  Review of Systems  Constitutional: Negative for chills, fever, malaise/fatigue and weight loss.  HENT: Negative for ear pain, hearing loss and tinnitus.   Eyes: Negative for blurred vision, double vision, pain and redness.  Respiratory: Negative for cough, hemoptysis and shortness of breath.   Cardiovascular: Negative for chest pain, palpitations, orthopnea and leg swelling.   Gastrointestinal: Negative for abdominal pain, constipation, diarrhea, nausea and vomiting.  Genitourinary: Negative for dysuria, frequency and hematuria.  Musculoskeletal: Positive for back pain and joint pain. Negative for neck pain.  Skin:       No acne, rash, or lesions  Neurological: Negative for dizziness, tremors, focal weakness and weakness.  Endo/Heme/Allergies: Negative for polydipsia. Does not bruise/bleed easily.  Psychiatric/Behavioral: Negative for depression. The patient is not nervous/anxious and does not have insomnia.      VITAL SIGNS:   Vitals:   11/15/16 1742 11/15/16 1744  BP: 109/61   Pulse: 87   Resp: 18   Temp: 99 F (  37.2 C)   TempSrc: Oral   SpO2: 93%   Weight:  63.5 kg (140 lb)  Height:  4\' 11"  (1.499 m)   Wt Readings from Last 3 Encounters:  11/15/16 63.5 kg (140 lb)  10/29/16 63.5 kg (140 lb 1.6 oz)  09/28/16 62.1 kg (137 lb)    PHYSICAL EXAMINATION:  Physical Exam  Vitals reviewed. Constitutional: She is oriented to person, place, and time. She appears well-developed and well-nourished. No distress.  HENT:  Head: Normocephalic and atraumatic.  Mouth/Throat: Oropharynx is clear and moist.  Eyes: Conjunctivae and EOM are normal. Pupils are equal, round, and reactive to light. No scleral icterus.  Neck: Normal range of motion. Neck supple. No JVD present. No thyromegaly present.  Cardiovascular: Normal rate, regular rhythm and intact distal pulses. Exam reveals no gallop and no friction rub.  No murmur heard. Respiratory: Effort normal and breath sounds normal. No respiratory distress. She has no wheezes. She has no rales.  GI: Soft. Bowel sounds are normal. She exhibits no distension. There is no tenderness.  Musculoskeletal: Normal range of motion. She exhibits no edema.  No arthritis, no gout  Lymphadenopathy:    She has no cervical adenopathy.  Neurological: She is alert and oriented to person, place, and time. No cranial nerve deficit.   No dysarthria, no aphasia  Skin: Skin is warm and dry. No rash noted. No erythema.  Psychiatric: She has a normal mood and affect. Her behavior is normal. Judgment and thought content normal.    LABORATORY PANEL:   CBC Recent Labs  Lab 11/15/16 1906  WBC 7.8  HGB 13.4  HCT 40.2  PLT 372   ------------------------------------------------------------------------------------------------------------------  Chemistries  Recent Labs  Lab 11/15/16 1906  NA 138  K 4.1  CL 97*  CO2 29  GLUCOSE 97  BUN 28*  CREATININE 0.79  CALCIUM 10.0  AST 26  ALT 16  ALKPHOS 232*  BILITOT 0.4   ------------------------------------------------------------------------------------------------------------------  Cardiac Enzymes No results for input(s): TROPONINI in the last 168 hours. ------------------------------------------------------------------------------------------------------------------  RADIOLOGY:  Dg Chest 1 View  Result Date: 11/15/2016 CLINICAL DATA:  Preoperative radiograph EXAM: CHEST 1 VIEW COMPARISON:  Chest CT 03/27/2016 FINDINGS: Mild cardiomegaly with large hiatal hernia. Mild elevation of the left hemidiaphragm with associated atelectasis. No focal consolidation. No pleural effusion pneumothorax. IMPRESSION: Mild cardiomegaly and large hiatal hernia without focal airspace disease. Left basilar atelectasis. Electronically Signed   By: Ulyses Jarred M.D.   On: 11/15/2016 19:14   Ct Pelvis Wo Contrast  Result Date: 11/15/2016 CLINICAL DATA:  Pelvic fractures EXAM: CT PELVIS WITHOUT CONTRAST TECHNIQUE: Multidetector CT imaging of the pelvis was performed following the standard protocol without intravenous contrast. COMPARISON:  11/11/2016 FINDINGS: Urinary Tract:  No abnormality visualized. Bowel: Minimal diverticulosis along the sigmoid colon. No acute bowel inflammation or obstruction. Vascular/Lymphatic: Moderate aortoiliac atherosclerosis. No aneurysm. Vascular stents  noted along the included left common femoral and femoral arteries. There appear to be bilateral femoral arterial stents on the scout view. Reproductive:  Hysterectomy.  No adnexal mass. Other: No free air free fluid. Scarring in the inguinal regions bilaterally Musculoskeletal: Dextroscoliosis of the lumbar spine with spondylosis. L4 vertebroplasty cement noted. Stable nondisplaced right sacral and iliac fractures about the SI joint without diastases. Sclerosis of the left sacrum is also noted which may reflect stigmata of an insufficiency fracture. Better visualized on today's exam is a left L5 transverse process fracture in addition. Template femora are maintained bilaterally. No pubic rami fracture is noted.  Intact pubic symphysis. IMPRESSION: 1. Nondisplaced subacute to chronic fracture of the right sacrum and posterior right iliac bone. Given sclerosis of the left sacral ala, stigmata of an insufficiency fracture of the sacrum is not entirely excluded. 2. Better visualized on today's exam is a nondisplaced left L5 transverse process fracture which in retrospect is stable. 3. Dextroconvex curvature of the lumbar spine with L4 body vertebral augmentation. Electronically Signed   By: Ashley Royalty M.D.   On: 11/15/2016 19:37    EKG:   Orders placed or performed during the hospital encounter of 11/15/16  . ED EKG  . ED EKG    IMPRESSION AND PLAN:  Principal Problem:   Sacral fracture (HCC) -PRN analgesia, PT and OT consults, orthopedic surgery consult Active Problems:   HTN (hypertension) -continue home meds   Asthma -home dose inhalers   GERD (gastroesophageal reflux disease) -home dose PPI   RLS (restless legs syndrome) -continue home meds  All the records are reviewed and case discussed with ED provider. Management plans discussed with the patient and/or family.  DVT PROPHYLAXIS: SubQ lovenox  GI PROPHYLAXIS: PPI  ADMISSION STATUS: Observation  CODE STATUS: Full Code Status History     Date Active Date Inactive Code Status Order ID Comments User Context   10/29/2016 18:25 10/31/2016 19:23 Full Code [id number]  Demetrios Loll, MD ED   03/27/2016 15:40 03/31/2016 19:37 Full Code [id number]  Idelle Crouch, MD Inpatient   01/30/2016 18:06 02/02/2016 15:20 Full Code [id number]  Henreitta Leber, MD Inpatient   08/24/2015 21:16 08/25/2015 10:24 Full Code [id number]  Gladstone Lighter, MD Inpatient   06/27/2015 16:42 06/30/2015 16:16 Partial Code [id number]  Bettey Costa, MD Inpatient   06/07/2015 20:08 06/11/2015 19:26 Full Code [id number]  Fritzi Mandes, MD Inpatient   05/22/2015 13:52 05/24/2015 16:12 Full Code [id number]  Algernon Huxley, MD Inpatient   03/11/2015 18:20 03/16/2015 14:15 Full Code [id number]  Fritzi Mandes, MD Inpatient   02/27/2015 15:53 03/03/2015 18:24 Full Code [id number]  Sela Hua, PA-C Inpatient    Advance Directive Documentation     Most Recent Value  Type of Advance Directive  Healthcare Power of Attorney, Living will  Pre-existing out of facility DNR order (yellow form or pink MOST form)  No data  "MOST" Form in Place?  No data      TOTAL TIME TAKING CARE OF THIS PATIENT: 45 minutes.   Jannifer Franklin, [place] Deriso [place] 11/15/2016, 11:23 PM  [facility]  Office  [phone]  CC: Primary care physician; Rusty Aus, MD  Note:  This document was prepared using Dragon voice recognition software and may include unintentional dictation errors.

## 2016-11-15 NOTE — ED Triage Notes (Signed)
Pt sent from Dr. Ammie Ferrier office for hip fracture. Pt had CT scan last week with positive hip fracture.

## 2016-11-16 ENCOUNTER — Encounter: Payer: Self-pay | Admitting: *Deleted

## 2016-11-16 ENCOUNTER — Other Ambulatory Visit: Payer: Self-pay

## 2016-11-16 DIAGNOSIS — S3210XA Unspecified fracture of sacrum, initial encounter for closed fracture: Secondary | ICD-10-CM | POA: Diagnosis not present

## 2016-11-16 LAB — TYPE AND SCREEN
ABO/RH(D): A POS
ANTIBODY SCREEN: NEGATIVE

## 2016-11-16 MED ORDER — MELATONIN 5 MG PO TABS
5.0000 mg | ORAL_TABLET | Freq: Every day | ORAL | Status: DC
  Administered 2016-11-16 (×2): 5 mg via ORAL
  Filled 2016-11-16 (×2): qty 1

## 2016-11-16 MED ORDER — ONDANSETRON HCL 4 MG PO TABS: 4.0000 mg | ORAL_TABLET | Freq: Four times a day (QID) | ORAL | Status: DC | PRN

## 2016-11-16 MED ORDER — BUDESONIDE 0.5 MG/2ML IN SUSP
0.5000 mg | Freq: Two times a day (BID) | RESPIRATORY_TRACT | Status: DC
  Administered 2016-11-16 (×2): 0.5 mg via RESPIRATORY_TRACT
  Filled 2016-11-16 (×3): qty 2

## 2016-11-16 MED ORDER — PNEUMOCOCCAL VAC POLYVALENT 25 MCG/0.5ML IJ INJ
0.5000 mL | INJECTION | INTRAMUSCULAR | Status: AC
  Administered 2016-11-17: 0.5 mL via INTRAMUSCULAR
  Filled 2016-11-16: qty 0.5

## 2016-11-16 MED ORDER — ALBUTEROL SULFATE (2.5 MG/3ML) 0.083% IN NEBU: 2.5000 mg | INHALATION_SOLUTION | Freq: Two times a day (BID) | RESPIRATORY_TRACT | Status: DC | PRN

## 2016-11-16 MED ORDER — SIMVASTATIN 20 MG PO TABS
20.0000 mg | ORAL_TABLET | Freq: Every day | ORAL | Status: DC
  Administered 2016-11-16: 20 mg via ORAL
  Filled 2016-11-16: qty 1

## 2016-11-16 MED ORDER — BUDESONIDE 180 MCG/ACT IN AEPB: 2.0000 | INHALATION_SPRAY | Freq: Two times a day (BID) | RESPIRATORY_TRACT | Status: DC

## 2016-11-16 MED ORDER — ENOXAPARIN SODIUM 40 MG/0.4ML ~~LOC~~ SOLN: 40.0000 mg | SUBCUTANEOUS | Status: DC

## 2016-11-16 MED ORDER — CLOPIDOGREL BISULFATE 75 MG PO TABS
75.0000 mg | ORAL_TABLET | Freq: Every day | ORAL | Status: DC
  Administered 2016-11-16 – 2016-11-17 (×2): 75 mg via ORAL
  Filled 2016-11-16 (×2): qty 1

## 2016-11-16 MED ORDER — MONTELUKAST SODIUM 10 MG PO TABS
10.0000 mg | ORAL_TABLET | ORAL | Status: DC
  Administered 2016-11-16 – 2016-11-17 (×2): 10 mg via ORAL
  Filled 2016-11-16 (×2): qty 1

## 2016-11-16 MED ORDER — ONDANSETRON HCL 4 MG/2ML IJ SOLN: 4.0000 mg | Freq: Four times a day (QID) | INTRAMUSCULAR | Status: DC | PRN

## 2016-11-16 MED ORDER — DILTIAZEM HCL ER COATED BEADS 180 MG PO CP24
180.0000 mg | ORAL_CAPSULE | ORAL | Status: DC
  Administered 2016-11-16 – 2016-11-17 (×2): 180 mg via ORAL
  Filled 2016-11-16 (×2): qty 1

## 2016-11-16 MED ORDER — GABAPENTIN 100 MG PO CAPS
100.0000 mg | ORAL_CAPSULE | Freq: Four times a day (QID) | ORAL | Status: DC
  Administered 2016-11-16 – 2016-11-17 (×5): 100 mg via ORAL
  Filled 2016-11-16 (×5): qty 1

## 2016-11-16 MED ORDER — PRAMIPEXOLE DIHYDROCHLORIDE 0.25 MG PO TABS
0.5000 mg | ORAL_TABLET | Freq: Two times a day (BID) | ORAL | Status: DC
  Administered 2016-11-16 – 2016-11-17 (×4): 0.5 mg via ORAL
  Filled 2016-11-16 (×4): qty 2

## 2016-11-16 MED ORDER — PANTOPRAZOLE SODIUM 40 MG PO TBEC
40.0000 mg | DELAYED_RELEASE_TABLET | Freq: Two times a day (BID) | ORAL | Status: DC
  Administered 2016-11-16 – 2016-11-17 (×3): 40 mg via ORAL
  Filled 2016-11-16 (×3): qty 1

## 2016-11-16 MED ORDER — TORSEMIDE 20 MG PO TABS
20.0000 mg | ORAL_TABLET | Freq: Two times a day (BID) | ORAL | Status: DC
  Administered 2016-11-16 – 2016-11-17 (×2): 20 mg via ORAL
  Filled 2016-11-16 (×3): qty 1

## 2016-11-16 MED ORDER — ASPIRIN EC 325 MG PO TBEC
325.0000 mg | DELAYED_RELEASE_TABLET | Freq: Every day | ORAL | Status: DC
  Administered 2016-11-16: 325 mg via ORAL
  Filled 2016-11-16: qty 1

## 2016-11-16 NOTE — Clinical Social Work Note (Signed)
Clinical Social Work Assessment  Patient Details  Name: Natasha [name] MRN: [id number] Date of Birth: [birth year]  Date of referral:  11/16/16               Reason for consult:  Facility Placement, Discharge Planning                Permission sought to share information with:  Chartered certified accountant granted to share information::  Yes, Verbal Permission Granted  Name::      [place]::   [place]  Relationship::     Contact Information:     Housing/Transportation Living arrangements for the past 2 months:  [place] of Information:  Patient Patient Interpreter Needed:  None Criminal Activity/Legal Involvement Pertinent to Current Situation/Hospitalization:  No - Comment as needed Significant Relationships:    Lives with:  Spouse Do you feel safe going back to the place where you live?  Yes Need for family participation in patient care:  Yes (Comment)  Care giving concerns:  Patient has lived with her husband Vincent/HPOA [phone]) at [place] for 22 years.   Social Worker assessment / plan: Holiday representative (CSW) received SNF consult. PT is recommending SNF. Social work Theatre manager met with patient alone at bedside. Patient was alert and oriented x4. Social work Theatre manager introduced self and explained the role of the [facility]. Patient stated that she has been a resident at [facility] for 22 years with her husband. Social work Theatre manager explained that PT is recommending SNF and the SNF process. Social work Theatre manager explained that medicare will not pay for SNF because of her observation status during her hospital stay and she will have to private pay. Social work Theatre manager also explained that per [employer] [facility], patient can come to rehab and use her 3 respite days however will have to pay privately afterwards. Patient verbalized her  understanding but became tearful because she cannot afford to pay out of pocket. Patient refused SNF and stated that she will go home and receive Home Care and PT twice a week (Monday and Friday). FL2 completed and faxed out. CSW and social work Theatre manager will continue to follow up and assist.  Employment status:  Retired Forensic scientist:  Medicare PT Recommendations:  [place] / Referral to community resources:  [place]  Patient/Family's Response to care:  Patient refused SNF and will continue to receive Home Care and PT twice a week at home.  Patient/Family's Understanding of and Emotional Response to Diagnosis, Current Treatment, and Prognosis: Patient was pleasant towards social work Theatre manager but became tearful and overwhelmed during assessment.   Emotional Assessment Appearance:  Appears stated age Attitude/Demeanor/Rapport:  Crying Affect (typically observed):  Sad, Tearful/Crying, Overwhelmed Orientation:  Oriented to Self, Oriented to Place, Oriented to  Time, Oriented to Situation Alcohol / Substance use:  Not Applicable Psych involvement (Current and /or in the community):     Discharge Needs  Concerns to be addressed:  Care Coordination, Discharge Planning Concerns Readmission within the last 30 days:  No Current discharge risk:  Dependent with Mobility Barriers to Discharge:  Continued Medical Work up   Smith Mince, Student-Social Work 11/16/2016, 11:13 AM

## 2016-11-16 NOTE — Progress Notes (Signed)
[facility] at [place] NAME: Natasha [name]    MR#:  [id number]  DATE OF BIRTH:  [birth year]  SUBJECTIVE:   Complains of left back pain REVIEW OF SYSTEMS:   Review of Systems  Constitutional: Negative for chills, fever and weight loss.  HENT: Negative for ear discharge, ear pain and nosebleeds.   Eyes: Negative for blurred vision, pain and discharge.  Respiratory: Negative for sputum production, shortness of breath, wheezing and stridor.   Cardiovascular: Negative for chest pain, palpitations, orthopnea and PND.  Gastrointestinal: Negative for abdominal pain, diarrhea, nausea and vomiting.  Genitourinary: Negative for frequency and urgency.  Musculoskeletal: Positive for back pain, falls and joint pain.  Neurological: Positive for weakness. Negative for sensory change, speech change and focal weakness.  Psychiatric/Behavioral: Negative for depression and hallucinations. The patient is not nervous/anxious.    Tolerating Diet:yes Tolerating PT: recommnends SNF  DRUG ALLERGIES:   Allergies  Allergen Reactions  . Codeine Diarrhea    Patient can tolerate small amounts of OXYCODONE per patient info sheet  . Hydrocodone Nausea And Vomiting    Patient can tolerate small amounts of OXYCODONE per patient info sheet  . Levofloxacin Other (See Comments)    Muscle cramps  . Tramadol Nausea And Vomiting    VITALS:  Blood pressure (!) 138/53, pulse 62, temperature 98.6 F (37 C), temperature source Oral, resp. rate 19, height 4\' 11"  (1.499 m), weight 60.9 kg (134 lb 3.2 oz), SpO2 97 %.  PHYSICAL EXAMINATION:   Physical Exam  GENERAL:  83 y.o.-year-old patient lying in the bed with no acute distress.  EYES: Pupils equal, round, reactive to light and accommodation. No scleral icterus. Extraocular muscles intact.  HEENT: Head atraumatic, normocephalic. Oropharynx and nasopharynx clear.  NECK:  Supple, no jugular venous distention. No  thyroid enlargement, no tenderness.  LUNGS: Normal breath sounds bilaterally, no wheezing, rales, rhonchi. No use of accessory muscles of respiration.  CARDIOVASCULAR: S1, S2 normal. No murmurs, rubs, or gallops.  ABDOMEN: Soft, nontender, nondistended. Bowel sounds present. No organomegaly or mass.  EXTREMITIES: No cyanosis, clubbing or edema b/l.    NEUROLOGIC: Cranial nerves II through XII are intact. No focal Motor or sensory deficits b/l.   PSYCHIATRIC:  patient is alert and oriented x 3.  SKIN: No obvious rash, lesion, or ulcer.   LABORATORY PANEL:  CBC Recent Labs  Lab 11/15/16 1906  WBC 7.8  HGB 13.4  HCT 40.2  PLT 372    Chemistries  Recent Labs  Lab 11/15/16 1906  NA 138  K 4.1  CL 97*  CO2 29  GLUCOSE 97  BUN 28*  CREATININE 0.79  CALCIUM 10.0  AST 26  ALT 16  ALKPHOS 232*  BILITOT 0.4   Cardiac Enzymes No results for input(s): TROPONINI in the last 168 hours. RADIOLOGY:  Dg Chest 1 View  Result Date: 11/15/2016 CLINICAL DATA:  Preoperative radiograph EXAM: CHEST 1 VIEW COMPARISON:  Chest CT 03/27/2016 FINDINGS: Mild cardiomegaly with large hiatal hernia. Mild elevation of the left hemidiaphragm with associated atelectasis. No focal consolidation. No pleural effusion pneumothorax. IMPRESSION: Mild cardiomegaly and large hiatal hernia without focal airspace disease. Left basilar atelectasis. Electronically Signed   By: Ulyses Jarred M.D.   On: 11/15/2016 19:14   Ct Pelvis Wo Contrast  Result Date: 11/15/2016 CLINICAL DATA:  Pelvic fractures EXAM: CT PELVIS WITHOUT CONTRAST TECHNIQUE: Multidetector CT imaging of the pelvis was performed following the standard protocol without intravenous contrast. COMPARISON:  11/11/2016 FINDINGS: Urinary Tract:  No abnormality visualized. Bowel: Minimal diverticulosis along the sigmoid colon. No acute bowel inflammation or obstruction. Vascular/Lymphatic: Moderate aortoiliac atherosclerosis. No aneurysm. Vascular stents noted  along the included left common femoral and femoral arteries. There appear to be bilateral femoral arterial stents on the scout view. Reproductive:  Hysterectomy.  No adnexal mass. Other: No free air free fluid. Scarring in the inguinal regions bilaterally Musculoskeletal: Dextroscoliosis of the lumbar spine with spondylosis. L4 vertebroplasty cement noted. Stable nondisplaced right sacral and iliac fractures about the SI joint without diastases. Sclerosis of the left sacrum is also noted which may reflect stigmata of an insufficiency fracture. Better visualized on today's exam is a left L5 transverse process fracture in addition. Template femora are maintained bilaterally. No pubic rami fracture is noted. Intact pubic symphysis. IMPRESSION: 1. Nondisplaced subacute to chronic fracture of the right sacrum and posterior right iliac bone. Given sclerosis of the left sacral ala, stigmata of an insufficiency fracture of the sacrum is not entirely excluded. 2. Better visualized on today's exam is a nondisplaced left L5 transverse process fracture which in retrospect is stable. 3. Dextroconvex curvature of the lumbar spine with L4 body vertebral augmentation. Electronically Signed   By: Ashley Royalty M.D.   On: 11/15/2016 19:37   ASSESSMENT AND PLAN:  Natasha [name]  is a 83 y.o. female who presents with pain in her right hip and left lower leg.  Patient fell several days ago and has had difficulty ambulating since that time.  She had imaging done in the outpatient setting which was concerning reportedly for hip fractures.  1.  Sacral fracture  And right iliac and L5 tranverse facet fracture s/p fall 3 weeks ago  -PRN analgesia, PT and OT consults appreciated  2.  HTN (hypertension) -continue home meds  3.  Asthma -home dose inhalers   4. GERD (gastroesophageal reflux disease) -home dose PPI  5  RLS (restless legs syndrome) -continue home meds   Discharge planning with care management. Thus with patient  and husband Case discussed with Care Management/Social Worker. Management plans discussed with the patient, family and they are in agreement.  CODE STATUS:full    TOTAL TIME TAKING CARE OF THIS PATIENT: *25* minutes.  >50% time spent on counselling and coordination of care  POSSIBLE D/C IN *1* DAYS, DEPENDING ON CLINICAL CONDITION.  Note: This dictation was prepared with Dragon dictation along with smaller phrase technology. Any transcriptional errors that result from this process are unintentional.  Lonnie Rosado M.D on 11/16/2016 at 3:44 PM  Between 7am to 6pm - Pager - [phone]  After 6pm go to [email or web address] - password [email or web address] [facility]  Office  [phone]  CC: Primary care physician; Rusty Aus, MD

## 2016-11-16 NOTE — Clinical Social Work Placement (Signed)
   CLINICAL SOCIAL WORK PLACEMENT  NOTE  Date:  11/16/2016  Patient Details  Name: Natasha [name] MRN: [id number] Date of Birth: [birth year]  Clinical Social Work is seeking post-discharge placement for this patient at the [place] level of care (*CSW will initial, date and re-position this form in  chart as items are completed):  Yes   Patient/family provided with [place] Work Department's list of facilities offering this level of care within the geographic area requested by the patient (or if unable, by the patient's family).  Yes   Patient/family informed of their freedom to choose among providers that offer the needed level of care, that participate in Medicare, Medicaid or managed care program needed by the patient, have an available bed and are willing to accept the patient.  Yes   Patient/family informed of [facility]'s ownership interest in [facility] and [facility], as well as of the fact that they are under no obligation to receive care at these facilities.  PASRR submitted to EDS on       PASRR number received on       Existing PASRR number confirmed on 11/16/16     FL2 transmitted to all facilities in geographic area requested by pt/family on 11/16/16     FL2 transmitted to all facilities within larger geographic area on       Patient informed that his/her managed care company has contracts with or will negotiate with certain facilities, including the following:        Yes   Patient/family informed of bed offers received.  Patient chooses bed at [facility] )     Physician recommends and patient chooses bed at      Patient to be transferred to   on  .  Patient to be transferred to facility by       Patient family notified on   of transfer.  Name of family member notified:        PHYSICIAN       Additional Comment:    _______________________________________________ Alka Falwell, Veronia Beets, LCSW 11/16/2016,  2:02 PM

## 2016-11-16 NOTE — Consult Note (Signed)
Patient is a 83 year old known to me from prior kyphoplasty. She reports that she fell when her husband tripped on her walker about 3 weeks ago. She has been having low back pain since then worse in the last week. She's had CAT scan that is very suggestive for sacral insufficiency fractures . On examination she is tender to the sacrum more on the right than left tonight nontender to percussion of the lumbar spine  Impression is sacral insufficiency fracture  Recommendation: I discussed treatment options with her including sacral plasty should like to hold off for now I think that's reasonable with a three-week history and recommend follow-up in after Thanksgiving in about 2-1/2 weeks where if at that time her pain is not improved and she is not regained ambulatory status as she was prior to her fall my recommendation would be for sacral plasty, recommend weightbearing as tolerated with physical therapy at this time

## 2016-11-16 NOTE — Care Management Note (Addendum)
Case Management Note  Patient Details  Name: Natasha [name] MRN: [id number] Date of Birth: [birth year]  Subjective/Objective: Admitted with sacral fracture. PT, OT and orthopedic consults pending. Met with patient to present observation letter. She is alert, oriented and very hard of hearing.  Patient lives at [place] with her husband that is 32. He is unable to assist patient. Prior to admission, she was independent with a walker but unsteady per patient. She wants to go to [facility] but states she can not pay privately. [facility] with [facility] since a 10/30/16 admission for right hip pain. She has 3 daughters and they all live out of state. There is no one else to assist her. Patient has nocturnal O2 through [employer]                   Action/Plan: PT, OT and ortho consult pending.  RNCM following progression. Updated CSW   Expected Discharge Date:                  Expected Discharge Plan:     In-House Referral:     Discharge planning Services  CM Consult  Post Acute Care Choice:    Choice offered to:     DME Arranged:    DME Agency:     HH Arranged:    HH Agency:     Status of Service:  In process, will continue to follow  If discussed at Long Length of Stay Meetings, dates discussed:    Additional Comments:  Jolly Mango, RN 11/16/2016, 8:42 AM

## 2016-11-16 NOTE — NC FL2 (Signed)
[place] LEVEL OF CARE SCREENING TOOL     IDENTIFICATION  Patient Name: Natasha [name] Birthdate: [birth year] Sex: female Admission Date (Current Location): 11/15/2016  [place] and [place] Number:  Engineering geologist and Address:  [facility], [address], [place], [place] [address]      Provider Number: [id number]  Attending Physician Name and Address:  Fritzi Mandes, MD  Relative Name and Phone Number:       Current Level of Care: Hospital Recommended Level of Care: [place] Prior Approval Number:    Date Approved/Denied:   PASRR Number: ([id number] )  Discharge Plan: SNF    Current Diagnoses: Patient Active Problem List   Diagnosis Date Noted  . Sacral fracture ([place]) 11/15/2016  . HTN (hypertension) 11/15/2016  . GERD (gastroesophageal reflux disease) 11/15/2016  . RLS (restless legs syndrome) 11/15/2016  . Asthma 11/15/2016  . Back pain 10/29/2016  . CAP (community acquired pneumonia) 03/27/2016  . Cellulitis 03/27/2016  . Hypokalemia 03/27/2016  . Acute respiratory failure ([place]) 03/27/2016  . Sepsis ([place]) 08/24/2015  . HCAP (healthcare-associated pneumonia) 06/27/2015  . Pneumonia 06/07/2015  . Hypotension 03/11/2015  . Lower extremity atheroembolism ([place]) 02/27/2015  . Ischemic leg 02/27/2015  . Calculus of gallbladder with other cholecystitis, without mention of obstruction 04/20/2013    Orientation RESPIRATION BLADDER Height & Weight     Self, Time, Situation, Place  O2(2 liters oxygen) Continent Weight: 134 lb 3.2 oz (60.9 kg) Height:  4\' 11"  (149.9 cm)  BEHAVIORAL SYMPTOMS/MOOD NEUROLOGICAL BOWEL NUTRITION STATUS      Continent Diet(Diet: Heart Healthy )  AMBULATORY STATUS COMMUNICATION OF NEEDS Skin   Extensive Assist Verbally Normal                       Personal Care Assistance Level of Assistance  Bathing, Feeding, Dressing Bathing Assistance: Limited assistance Feeding  assistance: Independent Dressing Assistance: Limited assistance     Functional Limitations Info  Sight, Hearing, Speech Sight Info: Adequate Hearing Info: Impaired Speech Info: Adequate    SPECIAL CARE FACTORS FREQUENCY  PT (By licensed PT), OT (By licensed OT)     PT Frequency: (5) OT Frequency: (5)            Contractures      Additional Factors Info  Code Status, Allergies Code Status Info: (Full Code. ) Allergies Info: (Codeine, Hydrocodone, Levofloxacin, Tramadol)           Current Medications (11/16/2016):  This is the current hospital active medication list Current Facility-Administered Medications  Medication Dose Route Frequency Provider Last Rate Last Dose  . acetaminophen (TYLENOL) tablet 650 mg  650 mg Oral Q6H PRN Lance Coon, MD       Or  . acetaminophen (TYLENOL) suppository 650 mg  650 mg Rectal Q6H PRN Lance Coon, MD      . albuterol (PROVENTIL) (2.5 MG/3ML) 0.083% nebulizer solution 2.5 mg  2.5 mg Inhalation BID PRN Lance Coon, MD      . aspirin EC tablet 325 mg  325 mg Oral Corwin Levins, MD      . budesonide (PULMICORT) nebulizer solution 0.5 mg  0.5 mg Nebulization BID Lance Coon, MD   0.5 mg at 11/16/16 0717  . clopidogrel (PLAVIX) tablet 75 mg  75 mg Oral Daily Lance Coon, MD      . diltiazem (CARDIZEM CD) 24 hr capsule 180 mg  180 mg Oral Brigid Re, MD  180 mg at 11/16/16 0622  . enoxaparin (LOVENOX) injection 40 mg  40 mg Subcutaneous Q24H Lance Coon, MD      . gabapentin (NEURONTIN) capsule 100 mg  100 mg Oral QID Lance Coon, MD      . Melatonin TABS 5 mg  5 mg Oral Corwin Levins, MD   5 mg at 11/16/16 0154  . montelukast (SINGULAIR) tablet 10 mg  10 mg Oral Brigid Re, MD   10 mg at 11/16/16 0617  . morphine 2 MG/ML injection 2 mg  2 mg Intravenous Q4H PRN Lance Coon, MD   2 mg at 11/16/16 0412  . ondansetron (ZOFRAN) tablet 4 mg  4 mg Oral Q6H PRN Lance Coon, MD       Or  . ondansetron  [facility]) injection 4 mg  4 mg Intravenous Q6H PRN Lance Coon, MD      . oxyCODONE-acetaminophen (PERCOCET/ROXICET) 5-325 MG per tablet 1 tablet  1 tablet Oral Q4H PRN Lance Coon, MD      . pantoprazole (PROTONIX) EC tablet 40 mg  40 mg Oral BID Lance Coon, MD      . Derrill Memo ON 11/17/2016] pneumococcal 23 valent vaccine (PNU-IMMUNE) injection 0.5 mL  0.5 mL Intramuscular Tomorrow-1000 Lance Coon, MD      . pramipexole (MIRAPEX) tablet 0.5 mg  0.5 mg Oral BID Lance Coon, MD   0.5 mg at 11/16/16 0129  . simvastatin (ZOCOR) tablet 20 mg  20 mg Oral QHS Lance Coon, MD      . torsemide [facility]) tablet 20 mg  20 mg Oral BID Lance Coon, MD         Discharge Medications: Please see discharge summary for a list of discharge medications.  Relevant Imaging Results:  Relevant Lab Results:   Additional Information (SSN: [id number])  Kashana Breach, Veronia Beets, LCSW

## 2016-11-16 NOTE — Plan of Care (Signed)
  Patient has a PT consult to address limited movement due to hip fractures.

## 2016-11-16 NOTE — Progress Notes (Signed)
[facility]  Patient Status: Active  [facility] is providing the following services: PT/OT  If patient discharges after hours, please call [phone].   Natasha [name] 11/16/2016, 10:05 AM

## 2016-11-16 NOTE — Plan of Care (Signed)
Pt. Will be independent with self-grooming skills. Pt. Will require minA toileting skills. Pt. Will be Modified independent with LE dressing skills

## 2016-11-16 NOTE — Progress Notes (Signed)
Pt up to side of bed with PT. On RA, pt oxy sat down to 86%. Pt oxygen reapplied at 2 lpm. Oxygen sat quick return to 93%. No s/sx of distress noted of patient and no c/o such. Will continue to monitor.

## 2016-11-16 NOTE — Evaluation (Signed)
Physical Therapy Evaluation Patient Details Name: Natasha [name] MRN: [id number] DOB: 27-Nov-1928 Today's Date: 11/16/2016   History of Present Illness  Pt is a 83 y/o F who presented with pain in her R hip and LLE. Pt fell several days PTA.  She had imaging done in the outpatient setting which was concerning reportedly for hip fractures. Repeat imaging in our ED showed right sacral and ischial spine fracturesas well as L5 transverse process fracture.  Dr. Marica Otter with Orthopedic surgery was consulted who decided [facility] with RW and follow-up x-ray in 1 wk. Pt was in hospital ~1 month PTA for back pain.  Pt's PMH includes cochlear implant.   Clinical Impression  Pt admitted with above diagnosis. Pt currently with functional limitations due to the deficits listed below (see PT Problem List). Natasha [name] presents in significant pain (10/10 with mobility) but is willing to participate with PT.  She lives with her husband who is unable to provide physical assist.  The pt currently requires min assist for bed mobility and to pivot to [facility] and chair.  Pt crying during transfers due to pain.  SpO2 down to 86% on RA with bed mobility. Given pt's current mobility status, recommending SNF at d/c.   Pt will benefit from skilled PT to increase their independence and safety with mobility to allow discharge to the venue listed below.      Follow Up Recommendations SNF    Equipment Recommendations  Rolling walker with 5" wheels    Recommendations for Other Services OT consult     Precautions / Restrictions Precautions Precautions: Fall;Other (comment) Precaution Comments: Monitor O2 Restrictions Weight Bearing Restrictions: Yes RLE Weight Bearing: Partial weight bearing      Mobility  Bed Mobility Overal bed mobility: Needs Assistance Bed Mobility: Supine to Sit     Supine to sit: Min assist;HOB elevated     General bed mobility comments: Assist to advance BLEs to EOB.  Increased time and  effort with heavy use of bed rail.  SpO2 drops to 86% on RA, back up to mid 90s quickly with 2L O2.   Transfers Overall transfer level: Needs assistance Equipment used: Rolling walker (2 wheeled) Transfers: Sit to/from [facility] Sit to Stand: Min assist Stand pivot transfers: Min assist       General transfer comment: Cues for proper hand placement and safe technique.  Assist to boost to standing.  Cues for proper weight shift to be able to advance RLE when pivoting to [facility].  Pt crying due to pain.   Ambulation/Gait Ambulation/Gait assistance: Min assist Ambulation Distance (Feet): 8 Feet(4x2) Assistive device: Rolling walker (2 wheeled) Gait Pattern/deviations: Shuffle;Trunk flexed Gait velocity: decreased Gait velocity interpretation: <1.8 ft/sec, indicative of risk for recurrent falls General Gait Details: Pt requires cues to weight shift to advance RLE.  Cues to promote pt adhering to [place] although pt is unable to achieve this.   Stairs            Wheelchair Mobility    Modified Rankin (Stroke Patients Only)       Balance Overall balance assessment: Needs assistance;History of Falls Sitting-balance support: No upper extremity supported;Feet supported Sitting balance-Leahy Scale: Good     Standing balance support: Bilateral upper extremity supported;During functional activity Standing balance-Leahy Scale: Poor Standing balance comment: Relies on UE support for static and dynamic activities.  Pertinent Vitals/Pain Pain Assessment: 0-10 Pain Score: 10-Worst pain ever Pain Location: 10/10 BLE with mobility Pain Descriptors / Indicators: Crying;Aching;Grimacing;Guarding;Moaning Pain Intervention(s): Limited activity within patient's tolerance;Monitored during session;RN gave pain meds during session    [place] expects to be discharged to:: Other (Comment)([facility]) Living Arrangements: Spouse/significant other Available Help at Discharge: (husband unable to provide physical assist) Type of Home: Independent living facility                Prior Function Level of Independence: Needs assistance   Gait / Transfers Assistance Needed: Before fall pt was independent ambulating with rollator and cane and denies any additional falls in the past 6 months.  SInce fall she has been ambualting only to the bathroom and back with much difficulty and severe pain.   ADL's / Homemaking Assistance Needed: Before fall the pt was cooking and cleaning and was independent with bathing, dressing.  Pt remains independent with dressing but requires assist from aide (comes 2x/wk) for bathing, cleaning. Pt has been doing frozen dinners.         Hand Dominance        Extremity/Trunk Assessment   Upper Extremity Assessment Upper Extremity Assessment: Overall WFL for tasks assessed    Lower Extremity Assessment Lower Extremity Assessment: (BLE strength grossly 3/5, limited due to pain Bil)    Cervical / Trunk Assessment Cervical / Trunk Assessment: Kyphotic  Communication   Communication: HOH("my implant is not working well")  Cognition Arousal/Alertness: Awake/alert Behavior During Therapy: WFL for tasks assessed/performed Overall Cognitive Status: Within Functional Limits for tasks assessed                                        General Comments      Exercises     Assessment/Plan    PT Assessment Patient needs continued PT services  PT Problem List Decreased strength;Decreased range of motion;Decreased activity tolerance;Decreased balance;Decreased mobility;Decreased knowledge of use of DME;Decreased safety awareness;Pain;Cardiopulmonary status limiting activity       PT Treatment Interventions DME instruction;Gait training;Functional mobility training;Therapeutic activities;Therapeutic exercise;Balance training;Neuromuscular  re-education;Patient/family education;Wheelchair mobility training;Modalities    PT Goals (Current goals can be found in the Care Plan section)  Acute Rehab PT Goals Patient Stated Goal: rehab before home PT Goal Formulation: With patient Time For Goal Achievement: 11/30/16 Potential to Achieve Goals: Good    Frequency 7X/week   Barriers to discharge Decreased caregiver support No assist available at d/c    Co-evaluation               AM-PAC PT "6 Clicks" Daily Activity  Outcome Measure Difficulty turning over in bed (including adjusting bedclothes, sheets and blankets)?: Unable Difficulty moving from lying on back to sitting on the side of the bed? : Unable Difficulty sitting down on and standing up from a chair with arms (e.g., wheelchair, bedside commode, etc,.)?: Unable Help needed moving to and from a bed to chair (including a wheelchair)?: A Little Help needed walking in hospital room?: A Lot Help needed climbing 3-5 steps with a railing? : Total 6 Click Score: 9    End of Session Equipment Utilized During Treatment: Gait belt;Oxygen Activity Tolerance: Patient limited by pain;Patient limited by fatigue Patient left: in chair;with call bell/phone within reach;with chair alarm set Nurse Communication: Mobility status;Weight bearing status PT Visit Diagnosis: Pain;Muscle weakness (generalized) (M62.81);History of falling (Z91.81);Unsteadiness  on feet (R26.81);Other abnormalities of gait and mobility (R26.89) Pain - Right/Left: (Bil) Pain - part of body: Hip    Time: 3710-6269 PT Time Calculation (min) (ACUTE ONLY): 44 min   Charges:   PT Evaluation $PT Eval Moderate Complexity: 1 Mod PT Treatments $Therapeutic Activity: 23-37 mins   PT G Codes:   PT G-Codes **NOT FOR INPATIENT CLASS** Functional Assessment Tool Used: AM-PAC 6 Clicks Basic Mobility;Clinical judgement Functional Limitation: Mobility: Walking and moving around Mobility: Walking and Moving  Around Current Status (S8546): At least 60 percent but less than 80 percent impaired, limited or restricted Mobility: Walking and Moving Around Goal Status [id number]): At least 20 percent but less than 40 percent impaired, limited or restricted    Collie Siad PT, DPT 11/16/2016, 10:08 AM

## 2016-11-16 NOTE — Evaluation (Signed)
Occupational Therapy Evaluation Patient Details Name: Natasha [name] MRN: [id number] DOB: 24-Dec-1928 Today's Date: 11/16/2016    History of Present Illness Pt is a 83 y/o F who presented with pain in her R hip and LLE. Pt fell several days PTA.  She had imaging done in the outpatient setting which was concerning reportedly for hip fractures. Repeat imaging in our ED showed right sacral and ischial spine fracturesas well as L5 transverse process fracture.  Dr. Marica Otter with Orthopedic surgery was consulted who decided [facility] with RW and follow-up x-ray in 1 wk. Pt was in hospital ~1 month PTA for back pain.  Pt's PMH includes cochlear implant.   Clinical Impression   Pt. is an 83 y.o. female who was admitted to [facility] with right sacral and ischial spine fractures, as well as, L5 transverse process fracture. Pt. presents with weakness, pain, and impaired functional mobility which limit her ability to complete basic ADL, and IADL tasks. Pt. resides in an independent villa at [facility] with her husband. Pt.'s husband is unable to assist her. Pt. Has home health aides for 2 hours daily. Pt. reports being independent with meal preparation, medication management, and driving prior to the fall. Pt. Is on 1.5L O2 via [place]. SO2 is 98% Pt. education was provided about A/E use for LE ADLs. Pt. reports having and using 2 reachers at home. Pt. presented with hypersensitivity to the sockaide. Pt. Could benefit from skilled oT services for ADL training, A/E training, functional mobility, and pt. Education about energy conservation, home modification, and DME. Pt. Would benefit from SNF upon discharge with follow-up OT services.    Follow Up Recommendations  SNF    Equipment Recommendations       Recommendations for Other Services       Precautions / Restrictions Precautions Precautions: Fall Precaution Booklet Issued: No Restrictions Weight Bearing Restrictions: Yes RLE Weight Bearing: Partial weight bearing              ADL either performed or assessed with clinical judgement   ADL Overall ADL's : Needs assistance/impaired Eating/Feeding: Independent;Set up   Grooming: Set up   Upper Body Bathing: Set up;Minimal assistance   Lower Body Bathing: Set up;Maximal assistance   Upper Body Dressing : Set up;Minimal assistance   Lower Body Dressing: Set up;Maximal assistance               Functional mobility during ADLs: Moderate assistance General ADL Comments: Pt. education was provided about A?E use for LE ADLs.     Vision Baseline Vision/History: No visual deficits Patient Visual Report: No change from baseline       Perception     Praxis      Pertinent Vitals/Pain Pain Assessment: 0-10 Pain Score: 8  Faces Pain Scale: (with movement) Pain Location: (Pt. is hypersensitive to sockaide use for LLE)     Hand Dominance     Extremity/Trunk Assessment Upper Extremity Assessment Upper Extremity Assessment: Overall WFL for tasks assessed           Communication Communication Communication: HOH   Cognition Arousal/Alertness: Awake/alert Behavior During Therapy: WFL for tasks assessed/performed Overall Cognitive Status: Within Functional Limits for tasks assessed                                     General Comments       Exercises     Shoulder Instructions  Home Living Family/patient expects to be discharged to:: (Independent living [place] [facility]) Living Arrangements: Spouse/significant other Available Help at Discharge: Home health Type of Home: Independent living facility                           Additional Comments: [facility]      Prior Functioning/Environment Level of Independence: Needs assistance    ADL's / Homemaking Assistance Needed: Pt. has home health aides assist for 2 hours a day. Pt. reports independence with meal preparation, and medication management.            OT  Problem List: Decreased strength;Decreased activity tolerance;Pain;Decreased safety awareness      OT Treatment/Interventions: Self-care/ADL training;Therapeutic exercise;Patient/family education;DME and/or AE instruction;Therapeutic activities    OT Goals(Current goals can be found in the care plan section) Acute Rehab OT Goals Patient Stated Goal: To return  home OT Goal Formulation: With patient Potential to Achieve Goals: Good  OT Frequency: Min 1X/week   Barriers to D/C:            Co-evaluation              AM-PAC PT "6 Clicks" Daily Activity     Outcome Measure Help from another person eating meals?: None Help from another person taking care of personal grooming?: A Little Help from another person toileting, which includes using toliet, bedpan, or urinal?: A Lot Help from another person bathing (including washing, rinsing, drying)?: A Lot Help from another person to put on and taking off regular upper body clothing?: A Little Help from another person to put on and taking off regular lower body clothing?: A Lot 6 Click Score: 16   End of Session    Activity Tolerance: Patient limited by pain Patient left: in chair  OT Visit Diagnosis: Unsteadiness on feet (R26.81);History of falling (Z91.81)                Time: 1305-1330 OT Time Calculation (min): 25 min Charges:  OT General Charges $OT Visit: 1 Visit OT Evaluation $OT Eval Moderate Complexity: 1 Mod G-Codes: OT G-codes **NOT FOR INPATIENT CLASS** Functional Assessment Tool Used: AM-PAC 6 Clicks Daily Activity;Clinical judgement Functional Limitation: Self care Self Care Current Status (K1601): At least 60 percent but less than 80 percent impaired, limited or restricted Self Care Goal Status (U9323): At least 1 percent but less than 20 percent impaired, limited or restricted   Harrel Carina, MS, OTR/L  Harrel Carina, MS, OTR/L 11/16/2016, 2:54 PM

## 2016-11-17 DIAGNOSIS — S3210XA Unspecified fracture of sacrum, initial encounter for closed fracture: Secondary | ICD-10-CM | POA: Diagnosis not present

## 2016-11-17 NOTE — Progress Notes (Signed)
OT Cancellation Note  Patient Details Name: Natasha [name] MRN: [id number] DOB: 1928-07-04   Cancelled Treatment:    Reason Eval/Treat Not Completed: Other (comment). Pt unavailable, discharging to [facility]. Will re-attempt next date if pt for some reason does not discharge as planned.   Jeni Salles, MPH, MS, OTR/L ascom [phone] 11/17/16, 11:11 AM

## 2016-11-17 NOTE — Care Management Note (Addendum)
Case Management Note  Patient Details  Name: Natasha [name] MRN: [id number] Date of Birth: 12/23/1928  Subjective/Objective:   Discharging today                 Action/Plan: Hessie Knows  Expected Discharge Date:  11/17/16               Expected Discharge Plan:    In-House Referral:  Clinical Social Work  Discharge planning Services  CM Consult  Post Acute Care Choice:   Choice offered to:     DME Arranged:    DME Agency:     HH Arranged:   [place] Agency:    Status of Service:  Completed, signed off  If discussed at [employer], dates discussed:    Additional Comments:  Natasha Mango, RN 11/17/2016, 10:12 AM

## 2016-11-17 NOTE — Discharge Summary (Signed)
[facility] NAME: Natasha [name]    MR#:  [id number]  DATE OF BIRTH:  04/06/1928  DATE OF ADMISSION:  11/15/2016 ADMITTING PHYSICIAN: Lance Coon, MD  DATE OF DISCHARGE: 11/17/2016  PRIMARY CARE PHYSICIAN: Rusty Aus, MD    ADMISSION DIAGNOSIS:  Closed fracture of sacrum with routine healing, unspecified portion of sacrum, subsequent encounter [S32.10XD] Sacral fracture ([place]) [S32.10XA]  DISCHARGE DIAGNOSIS:  Closed Nondisplaced right sacral and iliac fractures about the SI joint  left L5 transverse process fracture   SECONDARY DIAGNOSIS:   Past Medical History:  Diagnosis Date  . Anemia   . Arthritis   . Asthma   . Bilateral pneumonia may 2017  . Bronchitis   . Cardiomegaly   . Chronic respiratory failure with hypoxia ([place])   . Cochlear implant in place    bilateral, [name], 04/26/2007   . Gallstones 2015  . GERD (gastroesophageal reflux disease)   . H/O blood clots 2014  . History of home oxygen therapy    at night  . Hypertension   . Hypokalemia   . Peripheral vascular disease ([place])    with arterial clots- on xarelto  . Polyneuropathy   . Renal disorder    Chronic Kidney Disease, Stage 5  . Restless leg syndrome     HOSPITAL COURSE:   ElizabethCorsiniis a83 y.o.femalewho presents with pain in her right hip and left lower leg. Patient fell several days ago and has had difficulty ambulating since that time. She had imaging done in the outpatient setting which was concerning reportedly for hip fractures.  1. Closed nondisplacedSacral fracture and right iliac and L5 tranverse facet fracture s/p fall 3 weeks ago  -PRN analgesia, PT and OT consults appreciated -Orthopedic recommendations from Dr. Rudene Christians appreciated. Patient will discharge to [facility] for rehab--if her pain does not improve orthopedic recommends sacral plasty  2.HTN (hypertension) -continue home  meds  3.Asthma -home dose inhalers  4.GERD (gastroesophageal reflux disease) -home dose PPI  5RLS (restless legs syndrome) -continue home meds   Patient overall hemodynamically stable.  She will discharged her [facility] today.  pt agreeable   CONSULTS OBTAINED:  Treatment Team:  Hessie Knows, MD  DRUG ALLERGIES:   Allergies  Allergen Reactions  . Codeine Diarrhea    Patient can tolerate small amounts of OXYCODONE per patient info sheet  . Hydrocodone Nausea And Vomiting    Patient can tolerate small amounts of OXYCODONE per patient info sheet  . Levofloxacin Other (See Comments)    Muscle cramps  . Tramadol Nausea And Vomiting    DISCHARGE MEDICATIONS:   Current Discharge Medication List    CONTINUE these medications which have NOT CHANGED   Details  albuterol (PROVENTIL HFA;VENTOLIN HFA) 108 (90 BASE) MCG/ACT inhaler Inhale 2 puffs into the lungs 2 (two) times daily as needed for wheezing or shortness of breath.     aspirin EC 325 MG tablet Take 325 mg by mouth at bedtime.     budesonide (PULMICORT) 180 MCG/ACT inhaler Inhale 2 puffs into the lungs 2 (two) times daily.    Calcium Carbonate-Vit D-Min (CALCIUM 1200) 1200-1000 MG-UNIT CHEW Chew 1 tablet by mouth daily.    clopidogrel (PLAVIX) 75 MG tablet Take 75 mg by mouth daily.    diltiazem (CARDIZEM CD) 180 MG 24 hr capsule Take 180 mg by mouth every morning.     Esomeprazole Magnesium (NEXIUM PO) Take 22.3 mg by mouth 2 (two)  times daily.     gabapentin (NEURONTIN) 100 MG capsule Take 100 mg by mouth 4 (four) times daily.    Melatonin 5 MG TABS Take 5 mg by mouth at bedtime.     Misc Natural Products (GLUCOS-CHONDROIT-MSM COMPLEX PO) Take by mouth.    montelukast (SINGULAIR) 10 MG tablet Take 10 mg by mouth every morning.     Multiple Vitamins-Minerals (MULTIVITAMIN WITH MINERALS) tablet Take 1 tablet by mouth daily.    oxyCODONE-acetaminophen (PERCOCET/ROXICET) 5-325 MG tablet Take 1 tablet  by mouth every 4 (four) hours as needed for moderate pain or severe pain. Qty: 30 tablet, Refills: 0    potassium chloride SA (K-DUR,KLOR-CON) 20 MEQ tablet Take 1 tablet (20 mEq total) by mouth See admin instructions. Take 2 tablets (40 mEq) by mouth every morning, Take 20 mEq by mouth every afternoon, and Take 2 tablets (40 mEq) by mouth every night at bedtime.    pramipexole (MIRAPEX) 0.5 MG tablet Take 0.5 mg by mouth 2 (two) times daily.    simvastatin (ZOCOR) 20 MG tablet Take 20 mg by mouth at bedtime.    torsemide (DEMADEX) 20 MG tablet Take 1 tablet (20 mg total) by mouth 2 (two) times daily. Qty: 30 tablet, Refills: 0    albuterol (PROVENTIL) (2.5 MG/3ML) 0.083% nebulizer solution Take 3 mLs (2.5 mg total) by nebulization every 6 (six) hours as needed for wheezing or shortness of breath. Qty: 75 mL, Refills: 0        If you experience worsening of your admission symptoms, develop shortness of breath, life threatening emergency, suicidal or homicidal thoughts you must seek medical attention immediately by calling 911 or calling your MD immediately  if symptoms less severe.  You Must read complete instructions/literature along with all the possible adverse reactions/side effects for all the Medicines you take and that have been prescribed to you. Take any new Medicines after you have completely understood and accept all the possible adverse reactions/side effects.   Please note  You were cared for by a hospitalist during your hospital stay. If you have any questions about your discharge medications or the care you received while you were in the hospital after you are discharged, you can call the unit and asked to speak with the hospitalist on call if the hospitalist that took care of you is not available. Once you are discharged, your primary care physician will handle any further medical issues. Please note that NO REFILLS for any discharge medications will be authorized once you are  discharged, as it is imperative that you return to your primary care physician (or establish a relationship with a primary care physician if you do not have one) for your aftercare needs so that they can reassess your need for medications and monitor your lab values. Today   SUBJECTIVE   No new complaints  VITAL SIGNS:  Blood pressure (!) 149/84, pulse 65, temperature 98.2 F (36.8 C), temperature source Oral, resp. rate 18, height 4\' 11"  (1.499 m), weight 60.9 kg (134 lb 3.2 oz), SpO2 100 %.  I/O:    Intake/Output Summary (Last 24 hours) at 11/17/2016 0950 Last data filed at 11/17/2016 0555 Gross per 24 hour  Intake 240 ml  Output 250 ml  Net -10 ml    PHYSICAL EXAMINATION:  GENERAL:  83 y.o.-year-old patient lying in the bed with no acute distress.  EYES: Pupils equal, round, reactive to light and accommodation. No scleral icterus. Extraocular muscles intact.  HEENT: Head atraumatic, normocephalic.  Oropharynx and nasopharynx clear.  NECK:  Supple, no jugular venous distention. No thyroid enlargement, no tenderness.  LUNGS: Normal breath sounds bilaterally, no wheezing, rales,rhonchi or crepitation. No use of accessory muscles of respiration.  CARDIOVASCULAR: S1, S2 normal. No murmurs, rubs, or gallops.  ABDOMEN: Soft, non-tender, non-distended. Bowel sounds present. No organomegaly or mass.  EXTREMITIES: No pedal edema, cyanosis, or clubbing.  NEUROLOGIC: Cranial nerves II through XII are intact. Muscle strength 5/5 in all extremities. Sensation intact. Gait not checked.  PSYCHIATRIC: The patient is alert and oriented x 3.  SKIN: No obvious rash, lesion, or ulcer.   DATA REVIEW:   CBC  Recent Labs  Lab 11/15/16 1906  WBC 7.8  HGB 13.4  HCT 40.2  PLT 372    Chemistries  Recent Labs  Lab 11/15/16 1906  NA 138  K 4.1  CL 97*  CO2 29  GLUCOSE 97  BUN 28*  CREATININE 0.79  CALCIUM 10.0  AST 26  ALT 16  ALKPHOS 232*  BILITOT 0.4    Microbiology Results    No results found for this or any previous visit (from the past 240 hour(s)).  RADIOLOGY:  Dg Chest 1 View  Result Date: 11/15/2016 CLINICAL DATA:  Preoperative radiograph EXAM: CHEST 1 VIEW COMPARISON:  Chest CT 03/27/2016 FINDINGS: Mild cardiomegaly with large hiatal hernia. Mild elevation of the left hemidiaphragm with associated atelectasis. No focal consolidation. No pleural effusion pneumothorax. IMPRESSION: Mild cardiomegaly and large hiatal hernia without focal airspace disease. Left basilar atelectasis. Electronically Signed   By: Ulyses Jarred M.D.   On: 11/15/2016 19:14   Ct Pelvis Wo Contrast  Result Date: 11/15/2016 CLINICAL DATA:  Pelvic fractures EXAM: CT PELVIS WITHOUT CONTRAST TECHNIQUE: Multidetector CT imaging of the pelvis was performed following the standard protocol without intravenous contrast. COMPARISON:  11/11/2016 FINDINGS: Urinary Tract:  No abnormality visualized. Bowel: Minimal diverticulosis along the sigmoid colon. No acute bowel inflammation or obstruction. Vascular/Lymphatic: Moderate aortoiliac atherosclerosis. No aneurysm. Vascular stents noted along the included left common femoral and femoral arteries. There appear to be bilateral femoral arterial stents on the scout view. Reproductive:  Hysterectomy.  No adnexal mass. Other: No free air free fluid. Scarring in the inguinal regions bilaterally Musculoskeletal: Dextroscoliosis of the lumbar spine with spondylosis. L4 vertebroplasty cement noted. Stable nondisplaced right sacral and iliac fractures about the SI joint without diastases. Sclerosis of the left sacrum is also noted which may reflect stigmata of an insufficiency fracture. Better visualized on today's exam is a left L5 transverse process fracture in addition. Template femora are maintained bilaterally. No pubic rami fracture is noted. Intact pubic symphysis. IMPRESSION: 1. Nondisplaced subacute to chronic fracture of the right sacrum and posterior right  iliac bone. Given sclerosis of the left sacral ala, stigmata of an insufficiency fracture of the sacrum is not entirely excluded. 2. Better visualized on today's exam is a nondisplaced left L5 transverse process fracture which in retrospect is stable. 3. Dextroconvex curvature of the lumbar spine with L4 body vertebral augmentation. Electronically Signed   By: Ashley Royalty M.D.   On: 11/15/2016 19:37     Management plans discussed with the patient, family and they are in agreement.  CODE STATUS:     Code Status Orders  (From admission, onward)        Start     Ordered   11/16/16 0039  Full code  Continuous     11/16/16 0038    Code Status History    Date  Active Date Inactive Code Status Order ID Comments User Context   10/29/2016 18:25 10/31/2016 19:23 Full Code [id number]  Demetrios Loll, MD ED   03/27/2016 15:40 03/31/2016 19:37 Full Code [id number]  Idelle Crouch, MD Inpatient   01/30/2016 18:06 02/02/2016 15:20 Full Code [id number]  Henreitta Leber, MD Inpatient   08/24/2015 21:16 08/25/2015 10:24 Full Code [id number]  Gladstone Lighter, MD Inpatient   06/27/2015 16:42 06/30/2015 16:16 Partial Code [id number]  Bettey Costa, MD Inpatient   06/07/2015 20:08 06/11/2015 19:26 Full Code [id number]  Fritzi Mandes, MD Inpatient   05/22/2015 13:52 05/24/2015 16:12 Full Code [id number]  Algernon Huxley, MD Inpatient   03/11/2015 18:20 03/16/2015 14:15 Full Code [id number]  Fritzi Mandes, MD Inpatient   02/27/2015 15:53 03/03/2015 18:24 Full Code [id number]  Sela Hua, PA-C Inpatient    Advance Directive Documentation     Most Recent Value  Type of Advance Directive  Healthcare Power of Attorney, Living will  Pre-existing out of facility DNR order (yellow form or pink MOST form)  No data  "MOST" Form in Place?  No data      TOTAL TIME TAKING CARE OF THIS PATIENT: 40 minutes.    Syndey Jaskolski M.D on 11/17/2016 at 9:50 AM  Between 7am to 6pm - Pager - [phone] After 6pm go to [email or web address] - password  [facility]  Office  [phone]  CC: Primary care physician; Rusty Aus, MD

## 2016-11-17 NOTE — Progress Notes (Signed)
Patient has changed her mind and is now agreeable to D/C to [facility] and use 3 respite days and then pay privately. Patient is medically stable for D/C to [facility] today. Per Seth Bake admissions coordinator at [facility] patient can come today to room 216. RN will call report at [phone] and arrange EMS for transport. Clinical Education officer, museum (CSW) sent D/C orders to [facility] via [place]. Patient is aware of above. CSW contacted patient's husband Samaira Holzworth and made him aware of above. Please reconsult if future social work needs arise. CSW signing off.   [employer], LCSW [phone]

## 2016-11-17 NOTE — Progress Notes (Signed)
EMS here to transport

## 2016-11-17 NOTE — Clinical Social Work Placement (Signed)
   CLINICAL SOCIAL WORK PLACEMENT  NOTE  Date:  11/17/2016  Patient Details  Name: Natasha [name] MRN: [id number] Date of Birth: 15-Mar-1928  Clinical Social Work is seeking post-discharge placement for this patient at the [place] level of care (*CSW will initial, date and re-position this form in  chart as items are completed):  Yes   Patient/family provided with [place] Work Department's list of facilities offering this level of care within the geographic area requested by the patient (or if unable, by the patient's family).  Yes   Patient/family informed of their freedom to choose among providers that offer the needed level of care, that participate in Medicare, Medicaid or managed care program needed by the patient, have an available bed and are willing to accept the patient.  Yes   Patient/family informed of [facility]'s ownership interest in [facility] and [facility], as well as of the fact that they are under no obligation to receive care at these facilities.  PASRR submitted to EDS on       PASRR number received on       Existing PASRR number confirmed on 11/16/16     FL2 transmitted to all facilities in geographic area requested by pt/family on 11/16/16     FL2 transmitted to all facilities within larger geographic area on       Patient informed that his/her managed care company has contracts with or will negotiate with certain facilities, including the following:        Yes   Patient/family informed of bed offers received.  Patient chooses bed at [facility] )     Physician recommends and patient chooses bed at      Patient to be transferred to [facility] ) on 11/17/16.  Patient to be transferred to facility by [facility] )     Patient family notified on 11/17/16 of transfer.  Name of family member notified:  (Patient's husband Tisheena Maguire is aware of D/C today. )     PHYSICIAN        Additional Comment:    _______________________________________________ Vashaun Osmon, Veronia Beets, LCSW 11/17/2016, 11:10 AM

## 2016-11-17 NOTE — Progress Notes (Signed)
Patient is being discharged to [facility]. Report given to [facility]. IV removed, belongings packed and NT prepared patient for transportation via EMS.  EMS called.

## 2016-11-18 DIAGNOSIS — J9611 Chronic respiratory failure with hypoxia: Secondary | ICD-10-CM

## 2016-11-18 DIAGNOSIS — I10 Essential (primary) hypertension: Secondary | ICD-10-CM

## 2016-11-18 DIAGNOSIS — S32111A Minimally displaced Zone I fracture of sacrum, initial encounter for closed fracture: Secondary | ICD-10-CM

## 2016-11-18 DIAGNOSIS — G609 Hereditary and idiopathic neuropathy, unspecified: Secondary | ICD-10-CM

## 2016-11-26 DIAGNOSIS — B351 Tinea unguium: Secondary | ICD-10-CM | POA: Diagnosis not present

## 2017-02-12 IMAGING — XA IR CHEST FLUORO
12 of 13 series · 15 of 24 positions shown · non-contrast
Comparison: none

[Series 1: care sfa · 4 acquisitions, 1 frame shown (1 of 11)]
[im 1/4]
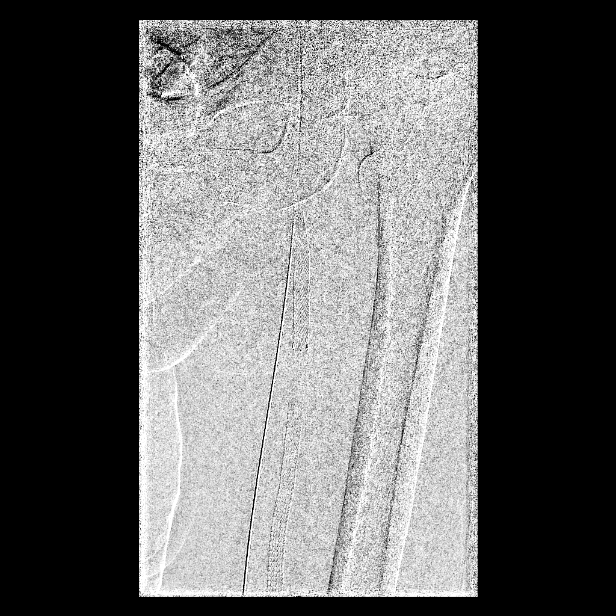

[Series 2: care sfa · 4 acquisitions, 1 frame shown (2 of 11)]
[im 1/4]
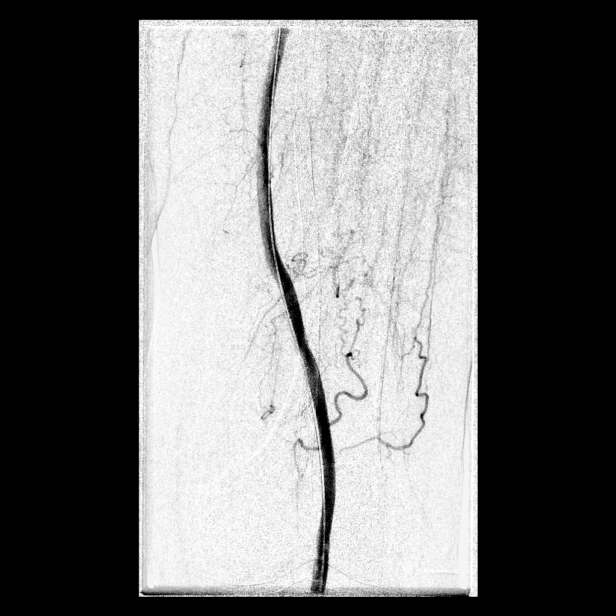

[Series 3: care sfa · 3 acquisitions, 2 frames shown (3 of 11)]
[im 1/3]
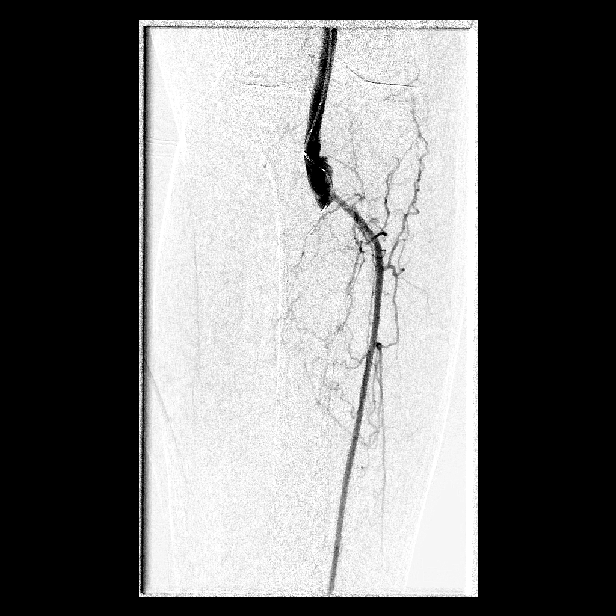
[im 3/3]
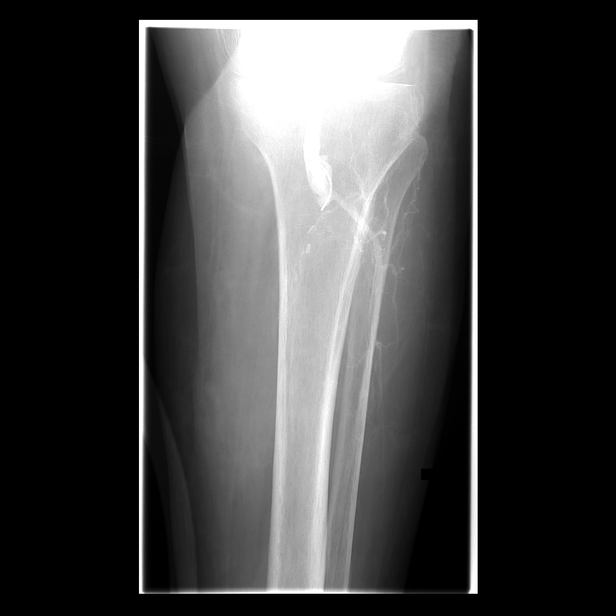

[Series 5: fl - angio · 1 of 1 slices shown]
[im 1/1]
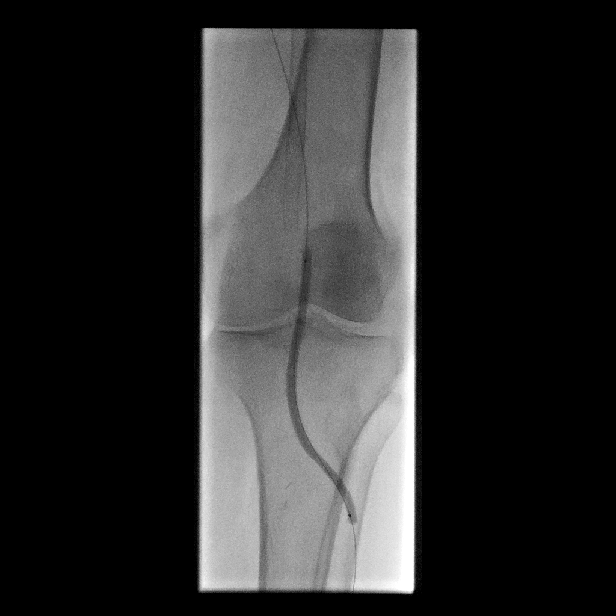

[Series 6: care sfa · 4 acquisitions, 1 frame shown (4 of 11)]
[im 1/4]
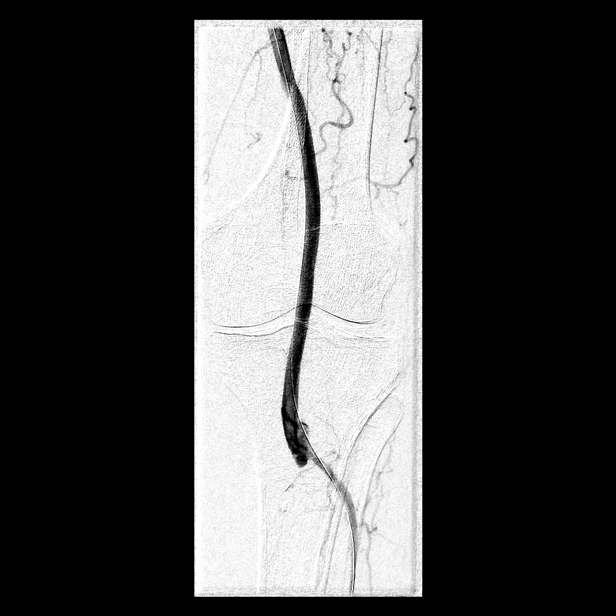

[Series 7: care sfa · 2 acquisitions, 1 frame shown (5 of 11)]
[im 1/2]
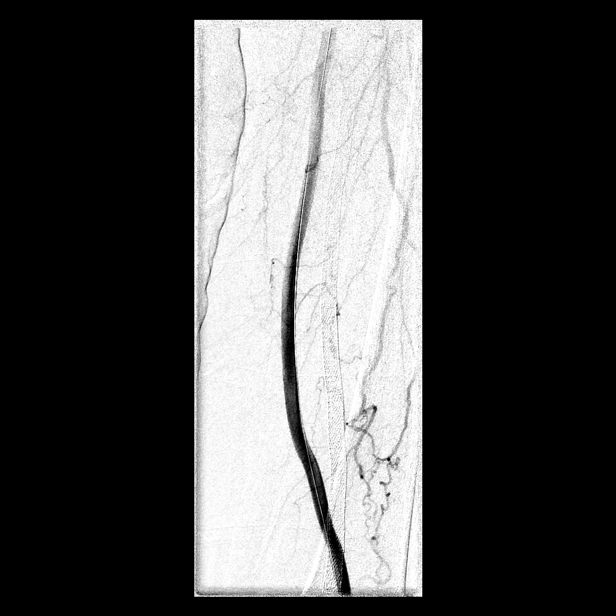

[Series 8: care sfa · 3 acquisitions, 1 frame shown (6 of 11)]
[im 1/3]
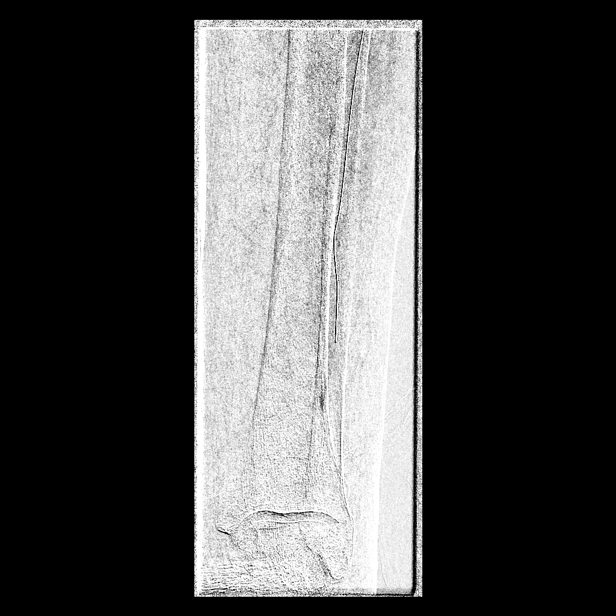

[Series 9: care sfa · 4 acquisitions, 1 frame shown (7 of 11)]
[im 1/4]
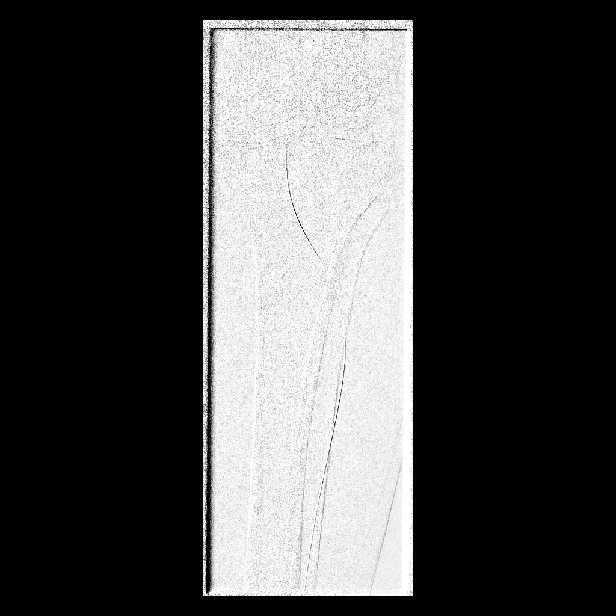

[Series 10: care sfa · 4 acquisitions, 2 frames shown (8 of 11)]
[im 1/4]
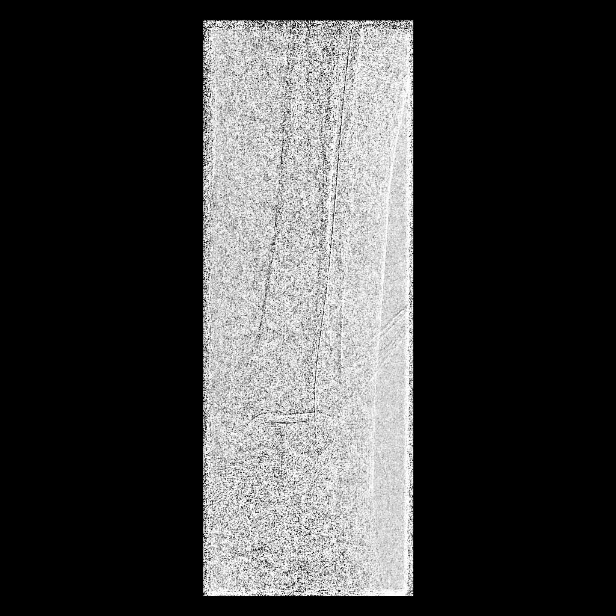
[im 1/4]
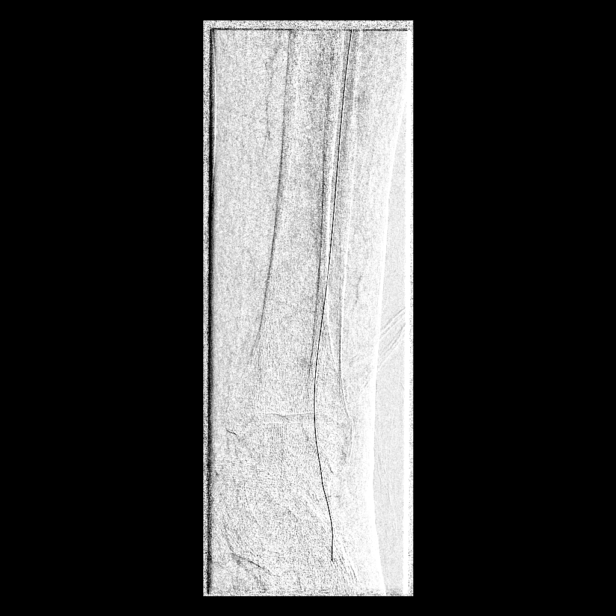

[Series 12: care sfa · 3 acquisitions, 1 frame shown (9 of 11)]
[im 1/3]
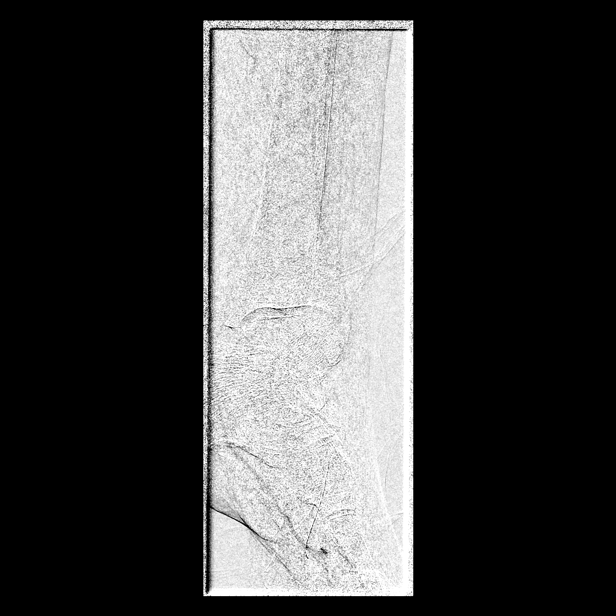

[Series 13: care sfa · 3 acquisitions, 2 frames shown (10 of 11)]
[im 1/3]
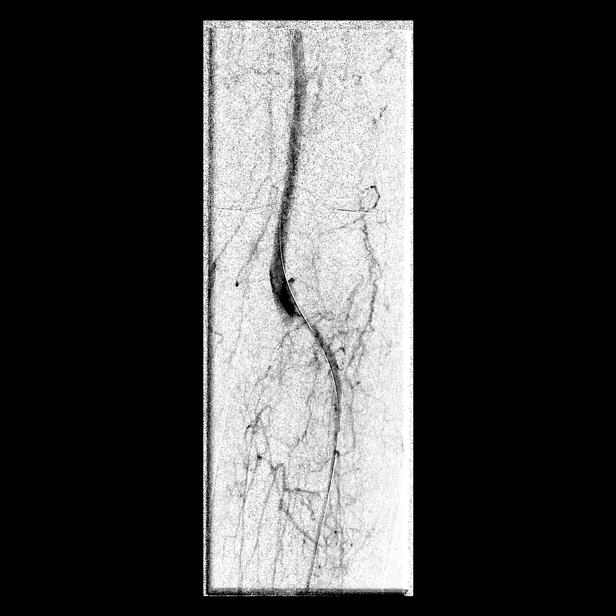
[im 3/3]
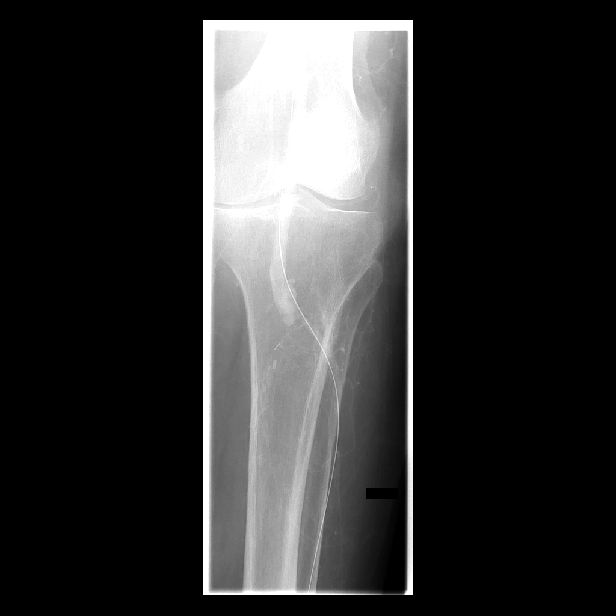

[Series 14: care sfa · 1 of 3 slices shown (11 of 11)]
[im 3/3]
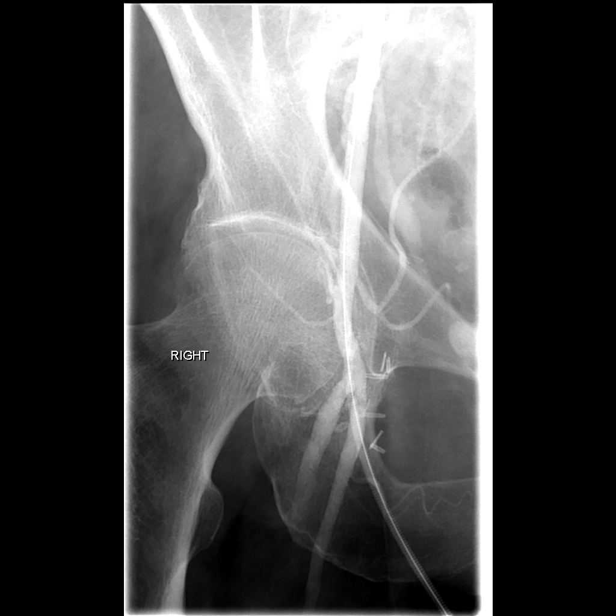

[15 of 24 positions shown; findings below may reference images not displayed]

Canned report from images found in remote index.

Refer to host system for actual result text.

## 2017-02-15 IMAGING — DX DG CHEST 1V
1 series · 1 of 1 positions shown · non-contrast
Comparison: 02/28/2015.

CLINICAL DATA: Community-acquired pneumonia.

EXAM:
CHEST 1 VIEW

[chest ap]
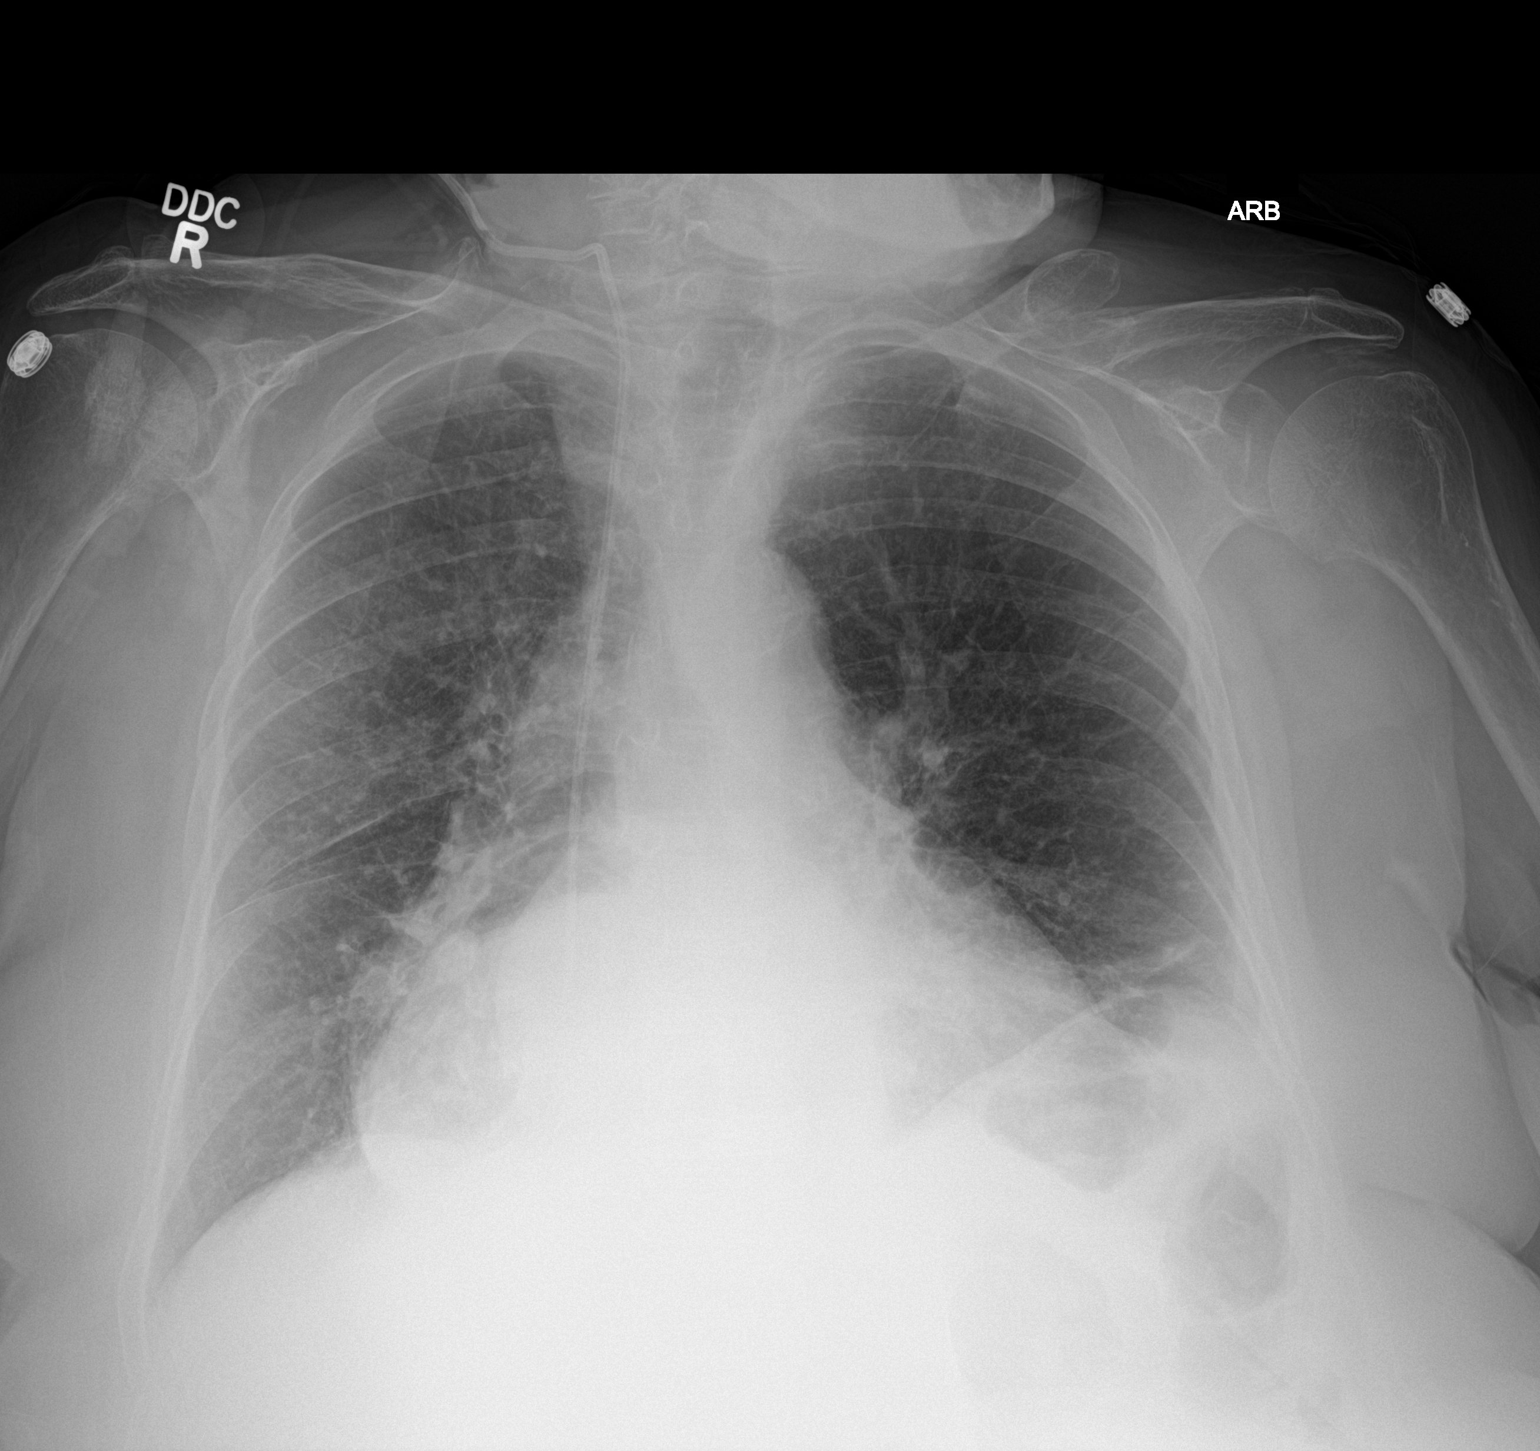

[1 of 1 positions shown; findings below may reference images not displayed]

FINDINGS: The cardiac silhouette remains borderline enlarged. A large hiatal
hernia is unchanged. Significant improvement in the previously seen
right upper lobe airspace opacity with minimal residual opacity.
Small amount of linear density at the left lung base. Mildly
prominent interstitial markings. Diffuse osteopenia. Old left
clavicle fracture.
IMPRESSION: 1. Significantly improved right upper lobe pneumonia.
2. Interval mild left basilar atelectasis.
3. Stable large hiatal hernia and mild chronic interstitial lung
disease.

## 2017-06-29 ENCOUNTER — Other Ambulatory Visit: Payer: Self-pay | Admitting: Physician Assistant

## 2017-06-29 ENCOUNTER — Ambulatory Visit
Admission: RE | Admit: 2017-06-29 | Discharge: 2017-06-29 | Disposition: A | Discharge status: Home / Self Care | Payer: Medicare Other | Source: Ambulatory Visit | Attending: Physician Assistant | Admitting: Physician Assistant

## 2017-06-29 DIAGNOSIS — M7989 Other specified soft tissue disorders: Secondary | ICD-10-CM

## 2017-07-05 ENCOUNTER — Other Ambulatory Visit: Payer: Self-pay | Admitting: Internal Medicine

## 2017-07-05 DIAGNOSIS — M5440 Lumbago with sciatica, unspecified side: Secondary | ICD-10-CM

## 2017-07-07 ENCOUNTER — Inpatient Hospital Stay
Admission: EM | Admit: 2017-07-07 | Discharge: 2017-07-11 | DRG: 603 | Disposition: A | Discharge status: Skilled Nursing Facility | Payer: Medicare Other | Attending: Internal Medicine | Admitting: Internal Medicine

## 2017-07-07 ENCOUNTER — Emergency Department: Payer: Medicare Other

## 2017-07-07 DIAGNOSIS — I132 Hypertensive heart and chronic kidney disease with heart failure and with stage 5 chronic kidney disease, or end stage renal disease: Secondary | ICD-10-CM | POA: Diagnosis present

## 2017-07-07 DIAGNOSIS — M79662 Pain in left lower leg: Secondary | ICD-10-CM | POA: Diagnosis not present

## 2017-07-07 DIAGNOSIS — L03119 Cellulitis of unspecified part of limb: Secondary | ICD-10-CM

## 2017-07-07 DIAGNOSIS — I82411 Acute embolism and thrombosis of right femoral vein: Secondary | ICD-10-CM | POA: Diagnosis present

## 2017-07-07 DIAGNOSIS — Z90710 Acquired absence of both cervix and uterus: Secondary | ICD-10-CM

## 2017-07-07 DIAGNOSIS — L0390 Cellulitis, unspecified: Secondary | ICD-10-CM | POA: Diagnosis present

## 2017-07-07 DIAGNOSIS — E785 Hyperlipidemia, unspecified: Secondary | ICD-10-CM | POA: Diagnosis present

## 2017-07-07 DIAGNOSIS — L03116 Cellulitis of left lower limb: Principal | ICD-10-CM | POA: Diagnosis present

## 2017-07-07 DIAGNOSIS — J45909 Unspecified asthma, uncomplicated: Secondary | ICD-10-CM | POA: Diagnosis present

## 2017-07-07 DIAGNOSIS — G2581 Restless legs syndrome: Secondary | ICD-10-CM | POA: Diagnosis present

## 2017-07-07 DIAGNOSIS — K219 Gastro-esophageal reflux disease without esophagitis: Secondary | ICD-10-CM | POA: Diagnosis present

## 2017-07-07 DIAGNOSIS — J9611 Chronic respiratory failure with hypoxia: Secondary | ICD-10-CM | POA: Diagnosis present

## 2017-07-07 DIAGNOSIS — Z7982 Long term (current) use of aspirin: Secondary | ICD-10-CM

## 2017-07-07 DIAGNOSIS — Z86718 Personal history of other venous thrombosis and embolism: Secondary | ICD-10-CM

## 2017-07-07 DIAGNOSIS — I5032 Chronic diastolic (congestive) heart failure: Secondary | ICD-10-CM | POA: Diagnosis present

## 2017-07-07 DIAGNOSIS — I739 Peripheral vascular disease, unspecified: Secondary | ICD-10-CM | POA: Diagnosis present

## 2017-07-07 DIAGNOSIS — Z7902 Long term (current) use of antithrombotics/antiplatelets: Secondary | ICD-10-CM

## 2017-07-07 DIAGNOSIS — Z87891 Personal history of nicotine dependence: Secondary | ICD-10-CM

## 2017-07-07 DIAGNOSIS — N185 Chronic kidney disease, stage 5: Secondary | ICD-10-CM | POA: Diagnosis present

## 2017-07-07 DIAGNOSIS — Z9981 Dependence on supplemental oxygen: Secondary | ICD-10-CM

## 2017-07-07 LAB — COMPREHENSIVE METABOLIC PANEL
ALBUMIN: 3.9 g/dL (ref 3.5–5.0)
ALK PHOS: 90 U/L (ref 38–126)
ALT: 14 U/L (ref 0–44)
AST: 29 U/L (ref 15–41)
Anion gap: 12 (ref 5–15)
BILIRUBIN TOTAL: 0.3 mg/dL (ref 0.3–1.2)
BUN: 21 mg/dL (ref 8–23)
CALCIUM: 9.2 mg/dL (ref 8.9–10.3)
CO2: 24 mmol/L (ref 22–32)
Chloride: 99 mmol/L (ref 98–111)
Creatinine, Ser: 0.89 mg/dL (ref 0.44–1.00)
GFR calc Af Amer: >60 mL/min (ref >60)
GFR calc non Af Amer: 57 mL/min — ABNORMAL LOW (ref >60)
GLUCOSE: 104 mg/dL — AB (ref 70–99)
Potassium: 3.8 mmol/L (ref 3.5–5.1)
SODIUM: 135 mmol/L (ref 135–145)
Total Protein: 7.4 g/dL (ref 6.5–8.1)

## 2017-07-07 LAB — CBC WITH DIFFERENTIAL/PLATELET
BASOS ABS: 0.1 10*3/uL (ref 0–0.1)
Basophils Relative: 1 %
Eosinophils Absolute: 0.1 10*3/uL (ref 0–0.7)
Eosinophils Relative: 1 %
HEMATOCRIT: 41.8 % (ref 35.0–47.0)
HEMOGLOBIN: 14.1 g/dL (ref 12.0–16.0)
LYMPHS PCT: 17 %
Lymphs Abs: 1.5 10*3/uL (ref 1.0–3.6)
MCH: 30.4 pg (ref 26.0–34.0)
MCHC: 33.7 g/dL (ref 32.0–36.0)
MCV: 90.2 fL (ref 80.0–100.0)
MONO ABS: 0.8 10*3/uL (ref 0.2–0.9)
MONOS PCT: 9 %
NEUTROS ABS: 6.6 10*3/uL — AB (ref 1.4–6.5)
NEUTROS PCT: 72 %
Platelets: 230 10*3/uL (ref 150–440)
RBC: 4.64 MIL/uL (ref 3.80–5.20)
RDW: 16.6 % — AB (ref 11.5–14.5)
WBC: 9.1 10*3/uL (ref 3.6–11.0)

## 2017-07-07 LAB — LACTIC ACID, PLASMA: Lactic Acid, Venous: 1.7 mmol/L (ref 0.5–1.9)

## 2017-07-07 LAB — PROTIME-INR
INR: 0.95
Prothrombin Time: 12.6 seconds (ref 11.4–15.2)

## 2017-07-07 MED ORDER — VANCOMYCIN HCL IN DEXTROSE 1-5 GM/200ML-% IV SOLN
1000.0000 mg | Freq: Once | INTRAVENOUS | Status: AC
  Administered 2017-07-07: 1000 mg via INTRAVENOUS
  Filled 2017-07-07: qty 200

## 2017-07-07 MED ORDER — ONDANSETRON HCL 4 MG/2ML IJ SOLN
4.0000 mg | Freq: Once | INTRAMUSCULAR | Status: AC
  Administered 2017-07-07: 4 mg via INTRAVENOUS
  Filled 2017-07-07: qty 2

## 2017-07-07 MED ORDER — FENTANYL CITRATE (PF) 100 MCG/2ML IJ SOLN
50.0000 ug | Freq: Once | INTRAMUSCULAR | Status: AC
  Administered 2017-07-07: 50 ug via INTRAVENOUS
  Filled 2017-07-07: qty 2

## 2017-07-07 MED ORDER — SODIUM CHLORIDE 0.9 % IV SOLN
1.0000 g | Freq: Once | INTRAVENOUS | Status: AC
  Administered 2017-07-07: 1 g via INTRAVENOUS
  Filled 2017-07-07: qty 10

## 2017-07-07 NOTE — ED Notes (Signed)
Pt arrived via EMS from home with reports of bilateral leg swelling, worse in left leg, bilateral cellulitis that has been recently treated with antibiotics, finished on 6/17. Pt has hx/o DVT in left leg.

## 2017-07-07 NOTE — ED Provider Notes (Signed)
[facility] Emergency Department Provider Note  ____________________________________________  Time seen: Approximately 11:17 PM  I have reviewed the triage vital signs and the nursing notes.   HISTORY  Chief Complaint Cellulitis   HPI Natasha [name] is a 84 y.o. female with a history of CHF, peripheral vascular disease on Plavix, CAD, chronic kidney disease, hypertension, hypokalemia who presents for evaluation of bilateral leg pain.  Patient reports that she was recently treated for cellulitis.  She has finished 2 courses of antibiotics.  She does not remember the first 1 but the second 1 was doxycycline for which she was on for 10 days.  She stopped it yesterday.  Today she started having worsening pain on bilateral lower extremity, swelling, and redness.  Left leg is worse than right.  The redness is going up her thigh.  She has had nausea and chills but no vomiting or fever.  She does report prior history of blood clots.  She reports that this evening her pain became severe, worse on the left leg, throbbing, nonradiating.  Past Medical History:  Diagnosis Date  . Anemia   . Arthritis   . Asthma   . Bilateral pneumonia may 2017  . Bronchitis   . Cardiomegaly   . Chronic respiratory failure with hypoxia ([place])   . Cochlear implant in place    bilateral, [name], 04/26/2007   . Gallstones 2015  . GERD (gastroesophageal reflux disease)   . H/O blood clots 2014  . History of home oxygen therapy    at night  . Hypertension   . Hypokalemia   . Peripheral vascular disease ([place])    with arterial clots- on xarelto  . Polyneuropathy   . Renal disorder    Chronic Kidney Disease, Stage 5  . Restless leg syndrome     Patient Active Problem List   Diagnosis Date Noted  . Sacral fracture ([place]) 11/15/2016  . HTN (hypertension) 11/15/2016  . GERD (gastroesophageal reflux disease) 11/15/2016  . RLS (restless legs syndrome) 11/15/2016  . Asthma  11/15/2016  . Back pain 10/29/2016  . CAP (community acquired pneumonia) 03/27/2016  . Cellulitis 03/27/2016  . Hypokalemia 03/27/2016  . Acute respiratory failure ([place]) 03/27/2016  . Sepsis ([place]) 08/24/2015  . HCAP (healthcare-associated pneumonia) 06/27/2015  . Pneumonia 06/07/2015  . Hypotension 03/11/2015  . Lower extremity atheroembolism ([place]) 02/27/2015  . Ischemic leg 02/27/2015  . Calculus of gallbladder with other cholecystitis, without mention of obstruction 04/20/2013    Past Surgical History:  Procedure Laterality Date  . ABDOMINAL HYSTERECTOMY  1960  . APPENDECTOMY  1946  . BREAST BIOPSY Bilateral    negative  . CHOLECYSTECTOMY  04-24-13  . COCHLEAR IMPLANT  2009  . COLONOSCOPY  2015   Dr. Rayann Heman   . EYE SURGERY  2008,2009   cataract  . KYPHOPLASTY N/A 08/19/2015   Procedure: KYPHOPLASTY;  Surgeon: Hessie Knows, MD;  Location: [facility] ORS;  Service: Orthopedics;  Laterality: N/A;  . PERIPHERAL VASCULAR CATHETERIZATION N/A 02/27/2015   Procedure: Abdominal Aortogram w/Lower Extremity;  Surgeon: Algernon Huxley, MD;  Location: [place] CV LAB;  Service: Cardiovascular;  Laterality: N/A;  . PERIPHERAL VASCULAR CATHETERIZATION  02/27/2015   Procedure: Lower Extremity Intervention;  Surgeon: Algernon Huxley, MD;  Location: [place] CV LAB;  Service: Cardiovascular;;  . PERIPHERAL VASCULAR CATHETERIZATION Left 02/28/2015   Procedure: Lower Extremity Angiography;  Surgeon: Algernon Huxley, MD;  Location: [place] [facility] LAB;  Service: Cardiovascular;  Laterality:  Left;  . PERIPHERAL VASCULAR CATHETERIZATION  02/28/2015   Procedure: Lower Extremity Intervention;  Surgeon: Algernon Huxley, MD;  Location: [place] CV LAB;  Service: Cardiovascular;;  . PERIPHERAL VASCULAR CATHETERIZATION Left 05/22/2015   Procedure: Lower Extremity Angiography;  Surgeon: Algernon Huxley, MD;  Location: [place] CV LAB;  Service: Cardiovascular;  Laterality: Left;  . PERIPHERAL VASCULAR CATHETERIZATION   05/22/2015   Procedure: Lower Extremity Intervention;  Surgeon: Algernon Huxley, MD;  Location: [place] CV LAB;  Service: Cardiovascular;;  . PERIPHERAL VASCULAR CATHETERIZATION Left 05/23/2015   Procedure: Lower Extremity Angiography;  Surgeon: Algernon Huxley, MD;  Location: [place] [facility];  Service: Cardiovascular;  Laterality: Left;  . PERIPHERAL VASCULAR CATHETERIZATION  05/23/2015   Procedure: Lower Extremity Intervention;  Surgeon: Algernon Huxley, MD;  Location: [place] [facility] LAB;  Service: Cardiovascular;;  . stent placement  2006-2014   multiple stent placements in legs    Prior to Admission medications   Medication Sig Start Date End Date Taking? Authorizing Provider  albuterol (PROVENTIL HFA;VENTOLIN HFA) 108 (90 BASE) MCG/ACT inhaler Inhale 2 puffs into the lungs 2 (two) times daily as needed for wheezing or shortness of breath.     [provider]  albuterol (PROVENTIL) (2.5 MG/3ML) 0.083% nebulizer solution Take 3 mLs (2.5 mg total) by nebulization every 6 (six) hours as needed for wheezing or shortness of breath. Patient not taking: Reported on 10/29/2016 06/11/15   Loletha Grayer, MD  aspirin EC 325 MG tablet Take 325 mg by mouth at bedtime.     [provider]  budesonide (PULMICORT) 180 MCG/ACT inhaler Inhale 2 puffs into the lungs 2 (two) times daily.    [provider]  Calcium Carbonate-Vit D-Min (CALCIUM 1200) 1200-1000 MG-UNIT CHEW Chew 1 tablet by mouth daily.    [provider]  clopidogrel (PLAVIX) 75 MG tablet Take 75 mg by mouth daily.    [provider]  diltiazem (CARDIZEM CD) 180 MG 24 hr capsule Take 180 mg by mouth every morning.     [provider]  Esomeprazole Magnesium (NEXIUM PO) Take 22.3 mg by mouth 2 (two) times daily.     [provider]  gabapentin (NEURONTIN) 100 MG capsule Take 100 mg by mouth 4 (four) times daily.    [provider]  Melatonin 5 MG TABS Take 5 mg by mouth at  bedtime.     [provider]  Misc Natural Products (GLUCOS-CHONDROIT-MSM COMPLEX PO) Take by mouth.    [provider]  montelukast (SINGULAIR) 10 MG tablet Take 10 mg by mouth every morning.     [provider]  Multiple Vitamins-Minerals (MULTIVITAMIN WITH MINERALS) tablet Take 1 tablet by mouth daily.    [provider]  oxyCODONE-acetaminophen (PERCOCET/ROXICET) 5-325 MG tablet Take 1 tablet by mouth every 4 (four) hours as needed for moderate pain or severe pain. 10/31/16   Epifanio Lesches, MD  potassium chloride SA (K-DUR,KLOR-CON) 20 MEQ tablet Take 1 tablet (20 mEq total) by mouth See admin instructions. Take 2 tablets (40 mEq) by mouth every morning, Take 20 mEq by mouth every afternoon, and Take 2 tablets (40 mEq) by mouth every night at bedtime. 03/31/16   Loletha Grayer, MD  pramipexole (MIRAPEX) 0.5 MG tablet Take 0.5 mg by mouth 2 (two) times daily. 06/30/15   [provider]  simvastatin (ZOCOR) 20 MG tablet Take 20 mg by mouth at bedtime. 07/03/15   [provider]  torsemide [facility])  20 MG tablet Take 1 tablet (20 mg total) by mouth 2 (two) times daily. 10/31/16   Epifanio Lesches, MD    Allergies Codeine; Hydrocodone; Levofloxacin; and Tramadol  Family History  Problem Relation Age of Onset  . Heart disease Mother   . Heart disease Father   . Cancer Sister        lung  . Breast cancer Daughter     Social History Social History   Tobacco Use  . Smoking status: Former Smoker    Packs/day: 1.00    Years: 25.00    Pack years: 25.00    Types: Cigarettes  . Smokeless tobacco: Never Used  Substance Use Topics  . Alcohol use: No  . Drug use: No    Review of Systems  Constitutional: Negative for fever. Eyes: Negative for visual changes. ENT: Negative for sore throat. Neck: No neck pain  Cardiovascular: Negative for chest pain. Respiratory: Negative for shortness of breath. Gastrointestinal: Negative  for abdominal pain, vomiting or diarrhea. Genitourinary: Negative for dysuria. Musculoskeletal: Negative for back pain. + Bilateral leg pain and swelling and redness Skin: Negative for rash. Neurological: Negative for headaches, weakness or numbness. Psych: No SI or HI  ____________________________________________   PHYSICAL EXAM:  VITAL SIGNS: ED Triage Vitals  Enc Vitals Group     BP 07/07/17 2028 (!) 123/40     Pulse Rate 07/07/17 2028 87     Resp 07/07/17 2028 20     Temp 07/07/17 2028 98.1 F (36.7 C)     Temp Source 07/07/17 2028 Oral     SpO2 07/07/17 2028 (!) 87 %     Weight 07/07/17 2035 134 lb (60.8 kg)     Height --      Head Circumference --      Peak Flow --      Pain Score 07/07/17 2034 7     Pain Loc --      Pain Edu? --      Excl. in [place]? --     Constitutional: Alert and oriented. Well appearing and in no apparent distress. HEENT:      Head: Normocephalic and atraumatic.         Eyes: Conjunctivae are normal. Sclera is non-icteric.       Mouth/Throat: Mucous membranes are moist.       Neck: Supple with no signs of meningismus. Cardiovascular: Regular rate and rhythm. No murmurs, gallops, or rubs. 2+ symmetrical distal pulses are present in all extremities. No JVD. Respiratory: Normal respiratory effort. Lungs are clear to auscultation bilaterally. No wheezes, crackles, or rhonchi.  Gastrointestinal: Soft, non tender, and non distended with positive bowel sounds. No rebound or guarding. Musculoskeletal: Asymmetric pitting edema, 2+ on the right and 3+ on the left, patient has warmth and erythema on both legs also worse on the left with streaking up her thigh, no crepitus  neurologic: Normal speech and language. Face is symmetric. Moving all extremities. No gross focal neurologic deficits are appreciated. Skin: Skin is warm, dry and intact. No rash noted. Psychiatric: Mood and affect are normal. Speech and behavior are  normal.  ____________________________________________   LABS (all labs ordered are listed, but only abnormal results are displayed)  Labs Reviewed  COMPREHENSIVE METABOLIC PANEL - Abnormal; Notable for the following components:      Result Value   Glucose, Bld 104 (*)    GFR calc non Af Amer 57 (*)    All other components within normal limits  CBC WITH  DIFFERENTIAL/PLATELET - Abnormal; Notable for the following components:   RDW 16.6 (*)    Neutro Abs 6.6 (*)    All other components within normal limits  CULTURE, BLOOD (ROUTINE X 2)  CULTURE, BLOOD (ROUTINE X 2)  LACTIC ACID, PLASMA  PROTIME-INR  LACTIC ACID, PLASMA  URINALYSIS, COMPLETE (UACMP) WITH MICROSCOPIC   ____________________________________________  EKG  none ____________________________________________  RADIOLOGY  I have personally reviewed the images performed during this visit and I agree with the Radiologist's read.   Interpretation by Radiologist:  Dg Chest 2 View  Result Date: 07/07/2017 CLINICAL DATA:  84 year old female with bilateral leg swelling. History of DVT. EXAM: CHEST - 2 VIEW COMPARISON:  Chest radiograph dated 11/15/2016 FINDINGS: Stable mild eventration of the left hemidiaphragm bed there is no focal consolidation, pleural effusion, or pneumothorax. Stable mild cardiomegaly. A moderate size hiatal hernia noted. No acute osseous pathology. IMPRESSION: No active cardiopulmonary disease. Electronically Signed   By: Anner Crete M.D.   On: 07/07/2017 21:08   Venous Doppler: PND  ____________________________________________   PROCEDURES  Procedure(s) performed: None Procedures Critical Care performed:  None ____________________________________________   INITIAL IMPRESSION / ASSESSMENT AND PLAN / ED COURSE  84 y.o. female with a history of CHF, peripheral vascular disease on Plavix, CAD, chronic kidney disease, hypertension, hypokalemia who presents for evaluation of bilateral leg  pain, swelling, redness, and warmth.  Presentation concerning for cellulitis worse on the left lower extremity, streaking up patient's inner thigh.  She has finished 2 courses of p.o. antibiotics with the last one yesterday.  She has no signs of sepsis at this time, she is afebrile, no tachycardia, and normal white count. Since she failed PO abx x 2 will start patient on IV abx and admit to Hospitalist. Venous doppler pending to rule out DVT.       As part of my medical decision making, I reviewed the following data within the [place] notes reviewed and incorporated, Labs reviewed , Old chart reviewed, Radiograph reviewed , Discussed with admitting physician , Notes from prior ED visits and [place] Controlled Substance Database    Pertinent labs & imaging results that were available during my care of the patient were reviewed by me and considered in my medical decision making (see chart for details).    ____________________________________________   FINAL CLINICAL IMPRESSION(S) / ED DIAGNOSES  Final diagnoses:  Cellulitis of lower extremity, unspecified laterality      NEW MEDICATIONS STARTED DURING THIS VISIT:  ED Discharge Orders    None       Note:  This document was prepared using Dragon voice recognition software and may include unintentional dictation errors.    Alfred Levins, [place], MD 07/07/17 2325

## 2017-07-07 NOTE — ED Triage Notes (Signed)
Pt reports being on 2 antibiotics, pt states she has had increased swelling and redness to BLE.  Noted upon assessment to have BLE up to thigh redness and swelling.  Pt states Dr. Sabra Heck changed her fluid pill, but she has had no change.  Pt is A&Ox4, hard of hearing.

## 2017-07-08 ENCOUNTER — Other Ambulatory Visit: Payer: Self-pay

## 2017-07-08 DIAGNOSIS — Z7902 Long term (current) use of antithrombotics/antiplatelets: Secondary | ICD-10-CM | POA: Diagnosis not present

## 2017-07-08 DIAGNOSIS — I132 Hypertensive heart and chronic kidney disease with heart failure and with stage 5 chronic kidney disease, or end stage renal disease: Secondary | ICD-10-CM | POA: Diagnosis present

## 2017-07-08 DIAGNOSIS — J45909 Unspecified asthma, uncomplicated: Secondary | ICD-10-CM | POA: Diagnosis present

## 2017-07-08 DIAGNOSIS — I82411 Acute embolism and thrombosis of right femoral vein: Secondary | ICD-10-CM | POA: Diagnosis present

## 2017-07-08 DIAGNOSIS — Z9981 Dependence on supplemental oxygen: Secondary | ICD-10-CM | POA: Diagnosis not present

## 2017-07-08 DIAGNOSIS — I5032 Chronic diastolic (congestive) heart failure: Secondary | ICD-10-CM | POA: Diagnosis present

## 2017-07-08 DIAGNOSIS — Z90710 Acquired absence of both cervix and uterus: Secondary | ICD-10-CM | POA: Diagnosis not present

## 2017-07-08 DIAGNOSIS — G2581 Restless legs syndrome: Secondary | ICD-10-CM | POA: Diagnosis present

## 2017-07-08 DIAGNOSIS — M79662 Pain in left lower leg: Secondary | ICD-10-CM | POA: Diagnosis present

## 2017-07-08 DIAGNOSIS — J9611 Chronic respiratory failure with hypoxia: Secondary | ICD-10-CM | POA: Diagnosis present

## 2017-07-08 DIAGNOSIS — Z86718 Personal history of other venous thrombosis and embolism: Secondary | ICD-10-CM | POA: Diagnosis not present

## 2017-07-08 DIAGNOSIS — K219 Gastro-esophageal reflux disease without esophagitis: Secondary | ICD-10-CM | POA: Diagnosis present

## 2017-07-08 DIAGNOSIS — Z87891 Personal history of nicotine dependence: Secondary | ICD-10-CM | POA: Diagnosis not present

## 2017-07-08 DIAGNOSIS — E785 Hyperlipidemia, unspecified: Secondary | ICD-10-CM | POA: Diagnosis present

## 2017-07-08 DIAGNOSIS — N185 Chronic kidney disease, stage 5: Secondary | ICD-10-CM | POA: Diagnosis present

## 2017-07-08 DIAGNOSIS — I739 Peripheral vascular disease, unspecified: Secondary | ICD-10-CM | POA: Diagnosis present

## 2017-07-08 DIAGNOSIS — L03116 Cellulitis of left lower limb: Secondary | ICD-10-CM | POA: Diagnosis present

## 2017-07-08 DIAGNOSIS — Z7982 Long term (current) use of aspirin: Secondary | ICD-10-CM | POA: Diagnosis not present

## 2017-07-08 LAB — URINALYSIS, COMPLETE (UACMP) WITH MICROSCOPIC
Bilirubin Urine: NEGATIVE
Glucose, UA: NEGATIVE mg/dL
Hgb urine dipstick: NEGATIVE
Ketones, ur: NEGATIVE mg/dL
Nitrite: NEGATIVE
Protein, ur: NEGATIVE mg/dL
Specific Gravity, Urine: 1.011 (ref 1.005–1.030)
WBC, UA: >50 WBC/hpf — ABNORMAL HIGH (ref 0–5)
pH: 5 (ref 5.0–8.0)

## 2017-07-08 LAB — TSH: TSH: 5.908 u[IU]/mL — ABNORMAL HIGH (ref 0.350–4.500)

## 2017-07-08 MED ORDER — ALPRAZOLAM 0.25 MG PO TABS
0.2500 mg | ORAL_TABLET | Freq: Two times a day (BID) | ORAL | Status: DC
  Administered 2017-07-08 (×2): 0.25 mg via ORAL
  Filled 2017-07-08 (×3): qty 1

## 2017-07-08 MED ORDER — DOCUSATE SODIUM 100 MG PO CAPS
100.0000 mg | ORAL_CAPSULE | Freq: Two times a day (BID) | ORAL | Status: DC
  Administered 2017-07-08 – 2017-07-11 (×8): 100 mg via ORAL
  Filled 2017-07-08 (×7): qty 1

## 2017-07-08 MED ORDER — MONTELUKAST SODIUM 10 MG PO TABS
10.0000 mg | ORAL_TABLET | ORAL | Status: DC
  Administered 2017-07-08 – 2017-07-11 (×4): 10 mg via ORAL
  Filled 2017-07-08 (×4): qty 1

## 2017-07-08 MED ORDER — DIPHENOXYLATE-ATROPINE 2.5-0.025 MG PO TABS: 0.5000 | ORAL_TABLET | Freq: Two times a day (BID) | ORAL | Status: DC | PRN

## 2017-07-08 MED ORDER — ACETAMINOPHEN 650 MG RE SUPP: 650.0000 mg | Freq: Four times a day (QID) | RECTAL | Status: DC | PRN

## 2017-07-08 MED ORDER — OXYCODONE-ACETAMINOPHEN 5-325 MG PO TABS
0.5000 | ORAL_TABLET | Freq: Two times a day (BID) | ORAL | Status: DC | PRN
  Administered 2017-07-08 – 2017-07-11 (×4): 0.5 via ORAL
  Filled 2017-07-08 (×4): qty 1

## 2017-07-08 MED ORDER — CLOPIDOGREL BISULFATE 75 MG PO TABS
75.0000 mg | ORAL_TABLET | Freq: Every day | ORAL | Status: DC
  Administered 2017-07-08: 75 mg via ORAL
  Filled 2017-07-08: qty 1

## 2017-07-08 MED ORDER — SIMVASTATIN 20 MG PO TABS
20.0000 mg | ORAL_TABLET | Freq: Every day | ORAL | Status: DC
  Administered 2017-07-08 – 2017-07-10 (×3): 20 mg via ORAL
  Filled 2017-07-08 (×4): qty 1

## 2017-07-08 MED ORDER — VANCOMYCIN HCL IN DEXTROSE 750-5 MG/150ML-% IV SOLN
750.0000 mg | INTRAVENOUS | Status: DC
  Administered 2017-07-08 – 2017-07-10 (×3): 750 mg via INTRAVENOUS
  Filled 2017-07-08 (×4): qty 150

## 2017-07-08 MED ORDER — VITAMIN D 1000 UNITS PO TABS
1000.0000 [IU] | ORAL_TABLET | Freq: Two times a day (BID) | ORAL | Status: DC
  Administered 2017-07-08 – 2017-07-11 (×7): 1000 [IU] via ORAL
  Filled 2017-07-08 (×7): qty 1

## 2017-07-08 MED ORDER — CALCIUM CARBONATE-VITAMIN D 500-200 MG-UNIT PO TABS
1.0000 | ORAL_TABLET | Freq: Two times a day (BID) | ORAL | Status: DC
  Administered 2017-07-08 – 2017-07-11 (×7): 1 via ORAL
  Filled 2017-07-08 (×7): qty 1

## 2017-07-08 MED ORDER — BUMETANIDE 1 MG PO TABS
1.0000 mg | ORAL_TABLET | Freq: Every day | ORAL | Status: DC
  Administered 2017-07-08: 1 mg via ORAL
  Filled 2017-07-08 (×2): qty 1

## 2017-07-08 MED ORDER — DILTIAZEM HCL ER COATED BEADS 180 MG PO CP24
180.0000 mg | ORAL_CAPSULE | ORAL | Status: DC
  Administered 2017-07-08 – 2017-07-11 (×4): 180 mg via ORAL
  Filled 2017-07-08 (×4): qty 1

## 2017-07-08 MED ORDER — BUDESONIDE 0.25 MG/2ML IN SUSP
0.2500 mg | Freq: Two times a day (BID) | RESPIRATORY_TRACT | Status: DC
  Administered 2017-07-08 – 2017-07-11 (×7): 0.25 mg via RESPIRATORY_TRACT
  Filled 2017-07-08 (×7): qty 2

## 2017-07-08 MED ORDER — MAGNESIUM OXIDE 400 (241.3 MG) MG PO TABS
400.0000 mg | ORAL_TABLET | Freq: Every day | ORAL | Status: DC
  Administered 2017-07-08 – 2017-07-11 (×4): 400 mg via ORAL
  Filled 2017-07-08 (×4): qty 1

## 2017-07-08 MED ORDER — TORSEMIDE 20 MG PO TABS
20.0000 mg | ORAL_TABLET | Freq: Two times a day (BID) | ORAL | Status: DC
  Administered 2017-07-08 – 2017-07-11 (×5): 20 mg via ORAL
  Filled 2017-07-08 (×8): qty 1

## 2017-07-08 MED ORDER — PANTOPRAZOLE SODIUM 40 MG PO TBEC
80.0000 mg | DELAYED_RELEASE_TABLET | Freq: Every day | ORAL | Status: DC
  Administered 2017-07-08 – 2017-07-11 (×4): 80 mg via ORAL
  Filled 2017-07-08 (×4): qty 2

## 2017-07-08 MED ORDER — BUDESONIDE 180 MCG/ACT IN AEPB: 2.0000 | INHALATION_SPRAY | Freq: Two times a day (BID) | RESPIRATORY_TRACT | Status: DC

## 2017-07-08 MED ORDER — APIXABAN 5 MG PO TABS
10.0000 mg | ORAL_TABLET | Freq: Two times a day (BID) | ORAL | Status: DC
  Administered 2017-07-08 – 2017-07-11 (×7): 10 mg via ORAL
  Filled 2017-07-08 (×7): qty 2

## 2017-07-08 MED ORDER — ASPIRIN EC 325 MG PO TBEC
325.0000 mg | DELAYED_RELEASE_TABLET | Freq: Every day | ORAL | Status: DC
  Administered 2017-07-08 – 2017-07-10 (×3): 325 mg via ORAL
  Filled 2017-07-08 (×3): qty 1

## 2017-07-08 MED ORDER — ONDANSETRON HCL 4 MG/2ML IJ SOLN: 4.0000 mg | Freq: Four times a day (QID) | INTRAMUSCULAR | Status: DC | PRN

## 2017-07-08 MED ORDER — ONDANSETRON HCL 4 MG PO TABS: 4.0000 mg | ORAL_TABLET | Freq: Four times a day (QID) | ORAL | Status: DC | PRN

## 2017-07-08 MED ORDER — POTASSIUM CHLORIDE CRYS ER 20 MEQ PO TBCR
20.0000 meq | EXTENDED_RELEASE_TABLET | Freq: Every day | ORAL | Status: DC
  Administered 2017-07-08 – 2017-07-10 (×3): 20 meq via ORAL
  Filled 2017-07-08 (×3): qty 1

## 2017-07-08 MED ORDER — ENOXAPARIN SODIUM 40 MG/0.4ML ~~LOC~~ SOLN: 40.0000 mg | SUBCUTANEOUS | Status: DC

## 2017-07-08 MED ORDER — PRAMIPEXOLE DIHYDROCHLORIDE 0.25 MG PO TABS
0.5000 mg | ORAL_TABLET | Freq: Two times a day (BID) | ORAL | Status: DC
  Administered 2017-07-08 – 2017-07-11 (×7): 0.5 mg via ORAL
  Filled 2017-07-08 (×7): qty 2

## 2017-07-08 MED ORDER — ACETAMINOPHEN 325 MG PO TABS
650.0000 mg | ORAL_TABLET | Freq: Four times a day (QID) | ORAL | Status: DC | PRN
  Administered 2017-07-08 – 2017-07-10 (×5): 650 mg via ORAL
  Filled 2017-07-08 (×5): qty 2

## 2017-07-08 MED ORDER — POTASSIUM CHLORIDE CRYS ER 20 MEQ PO TBCR
40.0000 meq | EXTENDED_RELEASE_TABLET | Freq: Two times a day (BID) | ORAL | Status: DC
  Administered 2017-07-08 – 2017-07-11 (×7): 40 meq via ORAL
  Filled 2017-07-08 (×7): qty 2

## 2017-07-08 MED ORDER — TEMAZEPAM 15 MG PO CAPS
15.0000 mg | ORAL_CAPSULE | Freq: Every evening | ORAL | Status: DC | PRN
  Administered 2017-07-09: 15 mg via ORAL
  Filled 2017-07-08 (×2): qty 1

## 2017-07-08 MED ORDER — APIXABAN 5 MG PO TABS
5.0000 mg | ORAL_TABLET | Freq: Two times a day (BID) | ORAL | Status: DC
Start: 2017-07-15 — End: 2017-07-11

## 2017-07-08 MED ORDER — MELATONIN 5 MG PO TABS
5.0000 mg | ORAL_TABLET | Freq: Every day | ORAL | Status: DC
  Administered 2017-07-08 – 2017-07-10 (×3): 5 mg via ORAL
  Filled 2017-07-08 (×4): qty 1

## 2017-07-08 NOTE — Progress Notes (Signed)
[place] [facility] [place] NAME: Natasha [name]    MR#:  [id number]  DATE OF BIRTH:  January 26, 1928  SUBJECTIVE:  Came in with increasing redness in left LE with swelling. Pt has significant PAD left > right Right groin pain  REVIEW OF SYSTEMS:   Review of Systems  Constitutional: Negative for chills, fever and weight loss.  HENT: Negative for ear discharge, ear pain and nosebleeds.   Eyes: Negative for blurred vision, pain and discharge.  Respiratory: Negative for sputum production, shortness of breath, wheezing and stridor.   Cardiovascular: Positive for leg swelling. Negative for chest pain, palpitations, orthopnea and PND.  Gastrointestinal: Negative for abdominal pain, diarrhea, nausea and vomiting.  Genitourinary: Negative for frequency and urgency.  Musculoskeletal: Positive for joint pain. Negative for back pain.  Neurological: Negative for sensory change, speech change, focal weakness and weakness.  Psychiatric/Behavioral: Negative for depression and hallucinations. The patient is not nervous/anxious.    Tolerating Diet:yes Tolerating PT: pending  DRUG ALLERGIES:   Allergies  Allergen Reactions  . Codeine Diarrhea    Patient can tolerate small amounts of OXYCODONE per patient info sheet  . Hydrocodone Nausea And Vomiting    Patient can tolerate small amounts of OXYCODONE per patient info sheet  . Levofloxacin Other (See Comments)    Muscle cramps  . Tramadol Nausea And Vomiting    VITALS:  Blood pressure (!) 101/54, pulse 77, temperature 98 F (36.7 C), temperature source Oral, resp. rate 20, height 4\' 11"  (1.499 m), weight 62 kg (136 lb 9.6 oz), SpO2 94 %.  PHYSICAL EXAMINATION:   Physical Exam  GENERAL:  84 y.o.-year-old patient lying in the bed with no acute distress.  EYES: Pupils equal, round, reactive to light and accommodation. No scleral icterus. Extraocular muscles intact.  HEENT: Head atraumatic,  normocephalic. Oropharynx and nasopharynx clear.  NECK:  Supple, no jugular venous distention. No thyroid enlargement, no tenderness.  LUNGS: Normal breath sounds bilaterally, no wheezing, rales, rhonchi. No use of accessory muscles of respiration.  CARDIOVASCULAR: S1, S2 normal. No murmurs, rubs, or gallops.  ABDOMEN: Soft, nontender, nondistended. Bowel sounds present. No organomegaly or mass.  EXTREMITIES: No cyanosis, clubbing or edema b/l.   LEft LE edema with old bypass scarring with pinking skin tinge. No obvious skin breakdown neuro Cranial nerves II through XII are intact. No focal Motor or sensory deficits b/l.   PSYCHIATRIC:  patient is alert and oriented x 3.  SKIN: No obvious rash, lesion, or ulcer.   LABORATORY PANEL:  CBC Recent Labs  Lab 07/07/17 2038  WBC 9.1  HGB 14.1  HCT 41.8  PLT 230    Chemistries  Recent Labs  Lab 07/07/17 2038  NA 135  K 3.8  CL 99  CO2 24  GLUCOSE 104*  BUN 21  CREATININE 0.89  CALCIUM 9.2  AST 29  ALT 14  ALKPHOS 90  BILITOT 0.3   Cardiac Enzymes No results for input(s): TROPONINI in the last 168 hours. RADIOLOGY:  Dg Chest 2 View  Result Date: 07/07/2017 CLINICAL DATA:  84 year old female with bilateral leg swelling. History of DVT. EXAM: CHEST - 2 VIEW COMPARISON:  Chest radiograph dated 11/15/2016 FINDINGS: Stable mild eventration of the left hemidiaphragm bed there is no focal consolidation, pleural effusion, or pneumothorax. Stable mild cardiomegaly. A moderate size hiatal hernia noted. No acute osseous pathology. IMPRESSION: No active cardiopulmonary disease. Electronically Signed   By: Anner Crete M.D.   On: 07/07/2017  21:08   US Venous Img Lower Unilateral Left  Result Date: 07/08/2017 CLINICAL DATA:  Hypoxia. Left lower extremity pain and swelling for 3 weeks. EXAM: LEFT LOWER EXTREMITY VENOUS DOPPLER ULTRASOUND TECHNIQUE: Gray-scale sonography with graded compression, as well as color Doppler and duplex  ultrasound were performed to evaluate the lower extremity deep venous systems from the level of the common femoral vein and including the common femoral, femoral, profunda femoral, popliteal and calf veins including the posterior tibial, peroneal and gastrocnemius veins when visible. The superficial great saphenous vein was also interrogated. Spectral Doppler was utilized to evaluate flow at rest and with distal augmentation maneuvers in the common femoral, femoral and popliteal veins. COMPARISON:  06/29/2017 left lower extremity venous Doppler scan. FINDINGS: Contralateral Common Femoral Vein: Nonocclusive acute deep venous thrombosis in the right common femoral vein near the bifurcation. Common Femoral Vein: No evidence of thrombus. Normal compressibility, respiratory phasicity and response to augmentation. Saphenofemoral Junction: Not visualized. Profunda Femoral Vein: No evidence of thrombus. Normal compressibility and flow on color Doppler imaging. Femoral Vein: No evidence of thrombus. Normal compressibility, respiratory phasicity and response to augmentation. Popliteal Vein: No evidence of thrombus. Normal compressibility, respiratory phasicity and response to augmentation. Calf Veins: No evidence of thrombus. Normal compressibility and flow on color Doppler imaging. Venous Reflux:  None. Other Findings: Thin-walled 3.0 x 1.4 x 2.3 cm left groin fluid collection with no internal vascularity, previously 2.4 x 1.5 x 2.7 cm on 06/29/2017, not substantially changed since 11/15/2016 CT. IMPRESSION: 1. Nonocclusive acute deep venous thrombosis in the right common femoral vein near the bifurcation. 2. No evidence of deep venous thrombosis in the left lower extremity, noting nonvisualization of the saphenofemoral junction. 3. Stable chronic small left groin fluid collection compatible with postsurgical seroma related to prior arterial bypass graft surgery. Electronically Signed   By: Ilona Sorrel M.D.   On:  07/08/2017 01:04   ASSESSMENT AND PLAN:   Natasha [name] is a 84 y.o. female with a history of CHF, peripheral vascular disease on Plavix, CAD, chronic kidney disease, hypertension, hypokalemia who presents for evaluation of bilateral leg pain.  Patient reports that she was recently treated for cellulitis.  She has finished 2 courses of antibiotics.  She does not remember the first 1 but the second 1 was doxycycline for which she was on for 10 days  1.  left LE Cellulitis: just finish course of doxycyline y'day -I am not too convinced this is failure of treatment. -no fever, leg not red/erythematous and BC neg with WBC normal -consider change to oral abx if BC remain negative -she has leg edma __?ace wrap  2.  Acute right CFV non occlusive thrombus--called and confirmed with radiology -d/w Dr Emily Filbert (PCP)   -d/c plavix and start eliquis (to decrease bleeding risk) and after 23 months pt will be switched back to plavix by PCP  3.  Hypertension: Controlled; continue diltiazem  4.  Peripheral artery disease: Continue aspirin   5.  ChronicHeart failure: Appears to be diastolic.  Continue Demadex and Bumex per home regimen. -pt not in HF -she uses oxygen at night time  6.  Hyperlipidemia: Continue statin therapy  7.  DVT prophylaxis: Lovenox     Case discussed with Care Management/Social Worker. Management plans discussed with the patient, family and they are in agreement.  CODE STATUS: full  DVT Prophylaxis: eliquis  TOTAL TIME TAKING CARE OF THIS PATIENT: *30* minutes.  >50% time spent on counselling and coordination of  care  POSSIBLE D/C IN *1-2* DAYS, DEPENDING ON CLINICAL CONDITION.  Note: This dictation was prepared with Dragon dictation along with smaller phrase technology. Any transcriptional errors that result from this process are unintentional.  Fritzi Mandes M.D on 07/08/2017 at 7:25 PM  Between 7am to 6pm - Pager - [phone]  After 6pm go to  [email or web address] - password [email or web address] [place] [facility]  Office  [phone]  CC: Primary care physician; Rusty Aus, MDPatient ID: Natasha [name], female   DOB: 1928-04-03, 84 y.o.   MRN: [id number]

## 2017-07-08 NOTE — Progress Notes (Signed)
[place] for Eliquis Indication: acute DVT  Allergies  Allergen Reactions  . Codeine Diarrhea    Patient can tolerate small amounts of OXYCODONE per patient info sheet  . Hydrocodone Nausea And Vomiting    Patient can tolerate small amounts of OXYCODONE per patient info sheet  . Levofloxacin Other (See Comments)    Muscle cramps  . Tramadol Nausea And Vomiting    Patient Measurements: Height: 4\' 11"  (149.9 cm) Weight: 136 lb 9.6 oz (62 kg) IBW/kg (Calculated) : 43.2  Vital Signs: Temp: 98 F (36.7 C) (06/28 1156) Temp Source: Oral (06/28 1156) BP: 101/54 (06/28 1156) Pulse Rate: 77 (06/28 1156)  Labs: Recent Labs    07/07/17 2038  HGB 14.1  HCT 41.8  PLT 230  LABPROT 12.6  INR 0.95  CREATININE 0.89    Estimated Creatinine Clearance: 36.3 mL/min (by C-G formula based on SCr of 0.89 mg/dL).   Medical History: Past Medical History:  Diagnosis Date  . Anemia   . Arthritis   . Asthma   . Bilateral pneumonia may 2017  . Bronchitis   . Cardiomegaly   . Chronic respiratory failure with hypoxia ([place])   . Cochlear implant in place    bilateral, [name], 04/26/2007   . Gallstones 2015  . GERD (gastroesophageal reflux disease)   . H/O blood clots 2014  . History of home oxygen therapy    at night  . Hypertension   . Hypokalemia   . Peripheral vascular disease ([place])    with arterial clots- on xarelto  . Polyneuropathy   . Renal disorder    Chronic Kidney Disease, Stage 5  . Restless leg syndrome     Medications:  Scheduled:  . ALPRAZolam  0.25 mg Oral BID  . apixaban  10 mg Oral BID  . [START ON 07/15/2017] apixaban  5 mg Oral BID  . aspirin EC  325 mg Oral QHS  . budesonide (PULMICORT) nebulizer solution  0.25 mg Nebulization BID  . bumetanide  1 mg Oral Daily  . calcium-vitamin D  1 tablet Oral BID  . cholecalciferol  1,000 Units Oral BID  . diltiazem  180 mg Oral BH-q7a  . docusate sodium  100 mg Oral  BID  . magnesium oxide  400 mg Oral Daily  . Melatonin  5 mg Oral QHS  . montelukast  10 mg Oral BH-q7a  . pantoprazole  80 mg Oral Q1200  . potassium chloride  20 mEq Oral Daily  . potassium chloride SA  40 mEq Oral BID  . pramipexole  0.5 mg Oral BID  . simvastatin  20 mg Oral QHS  . torsemide  20 mg Oral BID    Assessment: This is an 84 year old female being treated for acute DVT. She is not on any anti-coagulants PTA. She meets only one of the 3 dose reduction criteria for elderly patients listed in [employer]. However, her weight is near the 60kg threshold so we will have to monitor for any weight loss that would lead [place] to reduce the dose.    Goal of Therapy:  Monitor platelets by anticoagulation protocol: Yes   Plan:  Eliquis 10mg  twice daily for 7 days, then 5mg  twice daily thereafter,  Dallie Piles, PharmD 07/08/2017,12:38 PM

## 2017-07-08 NOTE — Progress Notes (Signed)
ANTIBIOTIC CONSULT NOTE - INITIAL  Pharmacy Consult for Vancomycin  Indication: cellulitis  Allergies  Allergen Reactions  . Codeine Diarrhea    Patient can tolerate small amounts of OXYCODONE per patient info sheet  . Hydrocodone Nausea And Vomiting    Patient can tolerate small amounts of OXYCODONE per patient info sheet  . Levofloxacin Other (See Comments)    Muscle cramps  . Tramadol Nausea And Vomiting    Patient Measurements: Height: 4\' 11"  (149.9 cm) Weight: 136 lb 9.6 oz (62 kg) IBW/kg (Calculated) : 43.2 Adjusted Body Weight:  50.7 kg   Vital Signs: Temp: 97.6 F (36.4 C) (06/28 0243) Temp Source: Oral (06/28 0243) BP: 116/52 (06/28 0243) Pulse Rate: 85 (06/28 0243) Intake/Output from previous day: 06/27 0701 - 06/28 0700 In: -  Out: 100 [Urine:100] Intake/Output from this shift: Total I/O In: -  Out: 100 [Urine:100]  Labs: Recent Labs    07/07/17 2038  WBC 9.1  HGB 14.1  PLT 230  CREATININE 0.89   Estimated Creatinine Clearance: 36.3 mL/min (by C-G formula based on SCr of 0.89 mg/dL). No results for input(s): VANCOTROUGH, VANCOPEAK, VANCORANDOM, GENTTROUGH, GENTPEAK, GENTRANDOM, TOBRATROUGH, TOBRAPEAK, TOBRARND, AMIKACINPEAK, AMIKACINTROU, AMIKACIN in the last 72 hours.   Microbiology: No results found for this or any previous visit (from the past 720 hour(s)).  Medical History: Past Medical History:  Diagnosis Date  . Anemia   . Arthritis   . Asthma   . Bilateral pneumonia may 2017  . Bronchitis   . Cardiomegaly   . Chronic respiratory failure with hypoxia ([place])   . Cochlear implant in place    bilateral, [name], 04/26/2007   . Gallstones 2015  . GERD (gastroesophageal reflux disease)   . H/O blood clots 2014  . History of home oxygen therapy    at night  . Hypertension   . Hypokalemia   . Peripheral vascular disease ([place])    with arterial clots- on xarelto  . Polyneuropathy   . Renal disorder    Chronic Kidney Disease,  Stage 5  . Restless leg syndrome     Medications:  Medications Prior to Admission  Medication Sig Dispense Refill Last Dose  . acetaminophen (TYLENOL) 325 MG tablet Take 650 mg by mouth every 6 (six) hours as needed for mild pain.   prn at prn  . albuterol (PROVENTIL HFA;VENTOLIN HFA) 108 (90 BASE) MCG/ACT inhaler Inhale 2 puffs into the lungs 2 (two) times daily as needed for wheezing or shortness of breath.    prn at prn  . ALPRAZolam (XANAX) 0.25 MG tablet Take 0.25 mg by mouth 2 (two) times daily.  1 07/07/2017 at Unknown time  . aspirin EC 325 MG tablet Take 325 mg by mouth at bedtime.    Past Week at Unknown time  . budesonide (PULMICORT) 180 MCG/ACT inhaler Inhale 2 puffs into the lungs 2 (two) times daily.   07/07/2017 at Unknown time  . bumetanide (BUMEX) 1 MG tablet Take 1 mg by mouth daily.   07/07/2017 at Unknown time  . Calcium 600-200 MG-UNIT tablet Take 1 tablet by mouth 2 (two) times daily.   07/07/2017 at Unknown time  . Cholecalciferol (VITAMIN D-1000 MAX ST) 1000 units tablet Take 1,000 Units by mouth 2 (two) times daily.   07/07/2017 at Unknown time  . clopidogrel (PLAVIX) 75 MG tablet Take 75 mg by mouth daily.   07/07/2017 at Unknown time  . diltiazem (CARDIZEM CD) 180 MG 24 hr capsule Take 180  mg by mouth every morning.    07/07/2017 at Unknown time  . diphenoxylate-atropine (LOMOTIL) 2.5-0.025 MG tablet Take 0.5 tablets by mouth 2 (two) times daily as needed for diarrhea or loose stools.   prn at prn  . esomeprazole (NEXIUM) 20 MG capsule Take 20 mg by mouth 2 (two) times daily.    07/07/2017 at Unknown time  . levalbuterol (XOPENEX HFA) 45 MCG/ACT inhaler Inhale 2 puffs into the lungs every 4 (four) hours as needed for wheezing.   prn at prn  . magnesium oxide (MAG-OX) 400 MG tablet Take 400 mg by mouth daily.   07/07/2017 at Unknown time  . Melatonin 5 MG TABS Take 5 mg by mouth at bedtime.    Past Week at Unknown time  . montelukast (SINGULAIR) 10 MG tablet Take 10 mg by mouth  every morning.    07/07/2017 at Unknown time  . oxyCODONE-acetaminophen (PERCOCET/ROXICET) 5-325 MG tablet Take 1 tablet by mouth every 4 (four) hours as needed for moderate pain or severe pain. (Patient taking differently: Take 0.5 tablets by mouth 2 (two) times daily as needed for moderate pain or severe pain. ) 30 tablet 0 prn at prn  . potassium chloride SA (K-DUR,KLOR-CON) 20 MEQ tablet Take 1 tablet (20 mEq total) by mouth See admin instructions. Take 2 tablets (40 mEq) by mouth every morning, Take 20 mEq by mouth every afternoon, and Take 2 tablets (40 mEq) by mouth every night at bedtime.   07/07/2017 at Unknown time  . pramipexole (MIRAPEX) 0.5 MG tablet Take 0.5 mg by mouth 2 (two) times daily.   07/07/2017 at Unknown time  . simvastatin (ZOCOR) 20 MG tablet Take 20 mg by mouth at bedtime.   Past Week at Unknown time  . temazepam (RESTORIL) 15 MG capsule Take 15 mg by mouth at bedtime as needed for sleep.   prn at prn  . torsemide (DEMADEX) 20 MG tablet Take 1 tablet (20 mg total) by mouth 2 (two) times daily. 30 tablet 0 07/07/2017 at Unknown time  . albuterol (PROVENTIL) (2.5 MG/3ML) 0.083% nebulizer solution Take 3 mLs (2.5 mg total) by nebulization every 6 (six) hours as needed for wheezing or shortness of breath. (Patient not taking: Reported on 10/29/2016) 75 mL 0 Not Taking at Unknown time   Assessment: CrCl = 36.3 ml/min Ke = 0.035 hr-1 T1/2 = 19.8 hrs Vd = 35.5 L   Goal of Therapy:  Vancomycin trough level 10-15 mcg/ml  Plan:  Expected duration 7 days with resolution of temperature and/or normalization of WBC   Vancomycin 1 gm IV X 1 given in ED on 6/27 @ 23:00 Vancomycin 750 mg IV Q24H to start 6/28 @ 23:00.  Will draw 1st trough on 7/1 @ 22:30.   Tye Vigo D 07/08/2017,5:24 AM

## 2017-07-08 NOTE — H&P (Signed)
Natasha [name] is an 84 y.o. female.   Chief Complaint: Cellulitis HPI: The patient with past medical history of hypertension, polyneuropathy, chronic respiratory failure, cardiomegaly, history of DVT and recent cellulitis presents the emergency department due to worsening swelling and redness of her left leg.  Patient has been on 2 rounds of antibiotics yet continues to have pain and increasing erythema of the left lower extremity.  The patient denies fever, nausea or vomiting.  Ultrasound of the left lower extremity on this encounter shows a nonocclusive thrombus of the right femoral vein.  The patient received analgesia in the emergency department but due to spreading erythema and cellulitis the emergency department staff called the hospitalist service for admission.  Past Medical History:  Diagnosis Date  . Anemia   . Arthritis   . Asthma   . Bilateral pneumonia may 2017  . Bronchitis   . Cardiomegaly   . Chronic respiratory failure with hypoxia ([place])   . Cochlear implant in place    bilateral, [name], 04/26/2007   . Gallstones 2015  . GERD (gastroesophageal reflux disease)   . H/O blood clots 2014  . History of home oxygen therapy    at night  . Hypertension   . Hypokalemia   . Peripheral vascular disease ([place])    with arterial clots- on xarelto  . Polyneuropathy   . Renal disorder    Chronic Kidney Disease, Stage 5  . Restless leg syndrome     Past Surgical History:  Procedure Laterality Date  . ABDOMINAL HYSTERECTOMY  1960  . APPENDECTOMY  1946  . BREAST BIOPSY Bilateral    negative  . CHOLECYSTECTOMY  04-24-13  . COCHLEAR IMPLANT  2009  . COLONOSCOPY  2015   Dr. Rayann Heman   . EYE SURGERY  2008,2009   cataract  . KYPHOPLASTY N/A 08/19/2015   Procedure: KYPHOPLASTY;  Surgeon: Hessie Knows, MD;  Location: [facility];  Service: Orthopedics;  Laterality: N/A;  . PERIPHERAL VASCULAR CATHETERIZATION N/A 02/27/2015   Procedure: Abdominal Aortogram w/Lower Extremity;   Surgeon: Algernon Huxley, MD;  Location: [place] [facility];  Service: Cardiovascular;  Laterality: N/A;  . PERIPHERAL VASCULAR CATHETERIZATION  02/27/2015   Procedure: Lower Extremity Intervention;  Surgeon: Algernon Huxley, MD;  Location: [place] [facility];  Service: Cardiovascular;;  . PERIPHERAL VASCULAR CATHETERIZATION Left 02/28/2015   Procedure: Lower Extremity Angiography;  Surgeon: Algernon Huxley, MD;  Location: [place] [facility];  Service: Cardiovascular;  Laterality: Left;  . PERIPHERAL VASCULAR CATHETERIZATION  02/28/2015   Procedure: Lower Extremity Intervention;  Surgeon: Algernon Huxley, MD;  Location: [place] [facility];  Service: Cardiovascular;;  . PERIPHERAL VASCULAR CATHETERIZATION Left 05/22/2015   Procedure: Lower Extremity Angiography;  Surgeon: Algernon Huxley, MD;  Location: [place] [facility];  Service: Cardiovascular;  Laterality: Left;  . PERIPHERAL VASCULAR CATHETERIZATION  05/22/2015   Procedure: Lower Extremity Intervention;  Surgeon: Algernon Huxley, MD;  Location: [place] [facility];  Service: Cardiovascular;;  . PERIPHERAL VASCULAR CATHETERIZATION Left 05/23/2015   Procedure: Lower Extremity Angiography;  Surgeon: Algernon Huxley, MD;  Location: [place] [facility];  Service: Cardiovascular;  Laterality: Left;  . PERIPHERAL VASCULAR CATHETERIZATION  05/23/2015   Procedure: Lower Extremity Intervention;  Surgeon: Algernon Huxley, MD;  Location: [place] [facility];  Service: Cardiovascular;;  . stent placement  2006-2014   multiple stent placements in legs    Family History  Problem Relation Age of Onset  .  Heart disease Mother   . Heart disease Father   . Cancer Sister        lung  . Breast cancer Daughter    Social History:  reports that she has quit smoking. Her smoking use included cigarettes. She has a 25.00 pack-year smoking history. She has never used smokeless tobacco. She reports that she does not drink alcohol or use drugs.  Allergies:  Allergies  Allergen  Reactions  . Codeine Diarrhea    Patient can tolerate small amounts of OXYCODONE per patient info sheet  . Hydrocodone Nausea And Vomiting    Patient can tolerate small amounts of OXYCODONE per patient info sheet  . Levofloxacin Other (See Comments)    Muscle cramps  . Tramadol Nausea And Vomiting     (Not in a hospital admission)  Results for orders placed or performed during the hospital encounter of 07/07/17 (from the past 48 hour(s))  Comprehensive metabolic panel     Status: Abnormal   Collection Time: 07/07/17  8:38 PM  Result Value Ref Range   Sodium 135 135 - 145 mmol/L   Potassium 3.8 3.5 - 5.1 mmol/L   Chloride 99 98 - 111 mmol/L    Comment: Please note change in reference range.   CO2 24 22 - 32 mmol/L   Glucose, Bld 104 (H) 70 - 99 mg/dL    Comment: Please note change in reference range.   BUN 21 8 - 23 mg/dL    Comment: Please note change in reference range.   Creatinine, Ser 0.89 0.44 - 1.00 mg/dL   Calcium 9.2 8.9 - 10.3 mg/dL   Total Protein 7.4 6.5 - 8.1 g/dL   Albumin 3.9 3.5 - 5.0 g/dL   AST 29 15 - 41 U/L   ALT 14 0 - 44 U/L    Comment: Please note change in reference range.   Alkaline Phosphatase 90 38 - 126 U/L   Total Bilirubin 0.3 0.3 - 1.2 mg/dL   GFR calc non Af Amer 57 (L) >60 mL/min   GFR calc Af Amer >60 >60 mL/min    Comment: (NOTE) The eGFR has been calculated using the CKD EPI equation. This calculation has not been validated in all clinical situations. eGFR's persistently <60 mL/min signify possible Chronic Kidney Disease.    Anion gap 12 5 - 15    Comment: Performed at [facility], [address] [place], [place] [address]  Lactic acid, plasma     Status: None   Collection Time: 07/07/17  8:38 PM  Result Value Ref Range   Lactic Acid, Venous 1.7 0.5 - 1.9 mmol/L    Comment: Performed at [facility], [address] [place], [place] [address]  CBC with Differential     Status: Abnormal   Collection Time:  07/07/17  8:38 PM  Result Value Ref Range   WBC 9.1 3.6 - 11.0 K/uL   RBC 4.64 3.80 - 5.20 MIL/uL   Hemoglobin 14.1 12.0 - 16.0 g/dL   HCT 41.8 35.0 - 47.0 %   MCV 90.2 80.0 - 100.0 fL   MCH 30.4 26.0 - 34.0 pg   MCHC 33.7 32.0 - 36.0 g/dL   RDW 16.6 (H) 11.5 - 14.5 %   Platelets 230 150 - 440 K/uL   Neutrophils Relative % 72 %   Neutro Abs 6.6 (H) 1.4 - 6.5 K/uL   Lymphocytes Relative 17 %   Lymphs Abs 1.5 1.0 - 3.6 K/uL   Monocytes Relative  9 %   Monocytes Absolute 0.8 0.2 - 0.9 K/uL   Eosinophils Relative 1 %   Eosinophils Absolute 0.1 0 - 0.7 K/uL   Basophils Relative 1 %   Basophils Absolute 0.1 0 - 0.1 K/uL    Comment: Performed at [facility], [address] [place], [place] [address]  Protime-INR     Status: None   Collection Time: 07/07/17  8:38 PM  Result Value Ref Range   Prothrombin Time 12.6 11.4 - 15.2 seconds   INR 0.95     Comment: Performed at [facility], [address] [place], [place] [address]  Urinalysis, Complete w Microscopic     Status: Abnormal   Collection Time: 07/07/17 10:41 PM  Result Value Ref Range   Color, Urine YELLOW (A) YELLOW   APPearance HAZY (A) CLEAR   Specific Gravity, Urine 1.011 1.005 - 1.030   pH 5.0 5.0 - 8.0   Glucose, UA NEGATIVE NEGATIVE mg/dL   Hgb urine dipstick NEGATIVE NEGATIVE   Bilirubin Urine NEGATIVE NEGATIVE   Ketones, ur NEGATIVE NEGATIVE mg/dL   Protein, ur NEGATIVE NEGATIVE mg/dL   Nitrite NEGATIVE NEGATIVE   Leukocytes, UA MODERATE (A) NEGATIVE   RBC / HPF 6-10 0 - 5 RBC/hpf   WBC, UA >50 (H) 0 - 5 WBC/hpf   Bacteria, UA RARE (A) NONE SEEN   Squamous Epithelial / LPF 6-10 0 - 5   Mucus PRESENT    Hyaline Casts, UA PRESENT    Non Squamous Epithelial 6-10 (A) NONE SEEN    Comment: Performed at [facility], [address], [place], [place] [address]   Dg Chest 2 View  Result Date: 07/07/2017 CLINICAL DATA:  84 year old female with bilateral leg swelling. History of DVT.  EXAM: CHEST - 2 VIEW COMPARISON:  Chest radiograph dated 11/15/2016 FINDINGS: Stable mild eventration of the left hemidiaphragm bed there is no focal consolidation, pleural effusion, or pneumothorax. Stable mild cardiomegaly. A moderate size hiatal hernia noted. No acute osseous pathology. IMPRESSION: No active cardiopulmonary disease. Electronically Signed   By: Anner Crete M.D.   On: 07/07/2017 21:08   US Venous Img Lower Unilateral Left  Result Date: 07/08/2017 CLINICAL DATA:  Hypoxia. Left lower extremity pain and swelling for 3 weeks. EXAM: LEFT LOWER EXTREMITY VENOUS DOPPLER ULTRASOUND TECHNIQUE: Gray-scale sonography with graded compression, as well as color Doppler and duplex ultrasound were performed to evaluate the lower extremity deep venous systems from the level of the common femoral vein and including the common femoral, femoral, profunda femoral, popliteal and calf veins including the posterior tibial, peroneal and gastrocnemius veins when visible. The superficial great saphenous vein was also interrogated. Spectral Doppler was utilized to evaluate flow at rest and with distal augmentation maneuvers in the common femoral, femoral and popliteal veins. COMPARISON:  06/29/2017 left lower extremity venous Doppler scan. FINDINGS: Contralateral Common Femoral Vein: Nonocclusive acute deep venous thrombosis in the right common femoral vein near the bifurcation. Common Femoral Vein: No evidence of thrombus. Normal compressibility, respiratory phasicity and response to augmentation. Saphenofemoral Junction: Not visualized. Profunda Femoral Vein: No evidence of thrombus. Normal compressibility and flow on color Doppler imaging. Femoral Vein: No evidence of thrombus. Normal compressibility, respiratory phasicity and response to augmentation. Popliteal Vein: No evidence of thrombus. Normal compressibility, respiratory phasicity and response to augmentation. Calf Veins: No evidence of thrombus. Normal  compressibility and flow on color Doppler imaging. Venous Reflux:  None. Other Findings: Thin-walled 3.0 x 1.4 x 2.3 cm left  groin fluid collection with no internal vascularity, previously 2.4 x 1.5 x 2.7 cm on 06/29/2017, not substantially changed since 11/15/2016 CT. IMPRESSION: 1. Nonocclusive acute deep venous thrombosis in the right common femoral vein near the bifurcation. 2. No evidence of deep venous thrombosis in the left lower extremity, noting nonvisualization of the saphenofemoral junction. 3. Stable chronic small left groin fluid collection compatible with postsurgical seroma related to prior arterial bypass graft surgery. Electronically Signed   By: Ilona Sorrel M.D.   On: 07/08/2017 01:04    Review of Systems  Constitutional: Negative for chills and fever.  HENT: Negative for sore throat and tinnitus.   Eyes: Negative for blurred vision and redness.  Respiratory: Negative for cough and shortness of breath.   Cardiovascular: Negative for chest pain, palpitations, orthopnea and PND.  Gastrointestinal: Negative for abdominal pain, diarrhea, nausea and vomiting.  Genitourinary: Negative for dysuria, frequency and urgency.  Musculoskeletal: Negative for joint pain and myalgias.  Skin: Negative for rash.       No lesions  Neurological: Negative for speech change, focal weakness and weakness.  Endo/Heme/Allergies: Does not bruise/bleed easily.       No temperature intolerance  Psychiatric/Behavioral: Negative for depression and suicidal ideas.    Blood pressure (!) 121/56, pulse 87, temperature 98.1 F (36.7 C), temperature source Oral, resp. rate 20, weight 60.8 kg (134 lb), SpO2 93 %. Physical Exam  Vitals reviewed. Constitutional: She is oriented to person, place, and time. She appears well-developed and well-nourished. No distress.  HENT:  Head: Normocephalic and atraumatic.  Mouth/Throat: Oropharynx is clear and moist.  Eyes: Pupils are equal, round, and reactive to light.  Conjunctivae and EOM are normal. No scleral icterus.  Neck: Normal range of motion. Neck supple. No JVD present. No tracheal deviation present. No thyromegaly present.  Cardiovascular: Normal rate, regular rhythm and normal heart sounds. Exam reveals no gallop and no friction rub.  No murmur heard. Respiratory: Effort normal and breath sounds normal.  GI: Soft. Bowel sounds are normal. She exhibits no distension. There is no tenderness.  Genitourinary:  Genitourinary Comments: Deferred  Musculoskeletal: Normal range of motion. She exhibits no edema.  Lymphadenopathy:    She has no cervical adenopathy.  Neurological: She is alert and oriented to person, place, and time. No cranial nerve deficit. She exhibits normal muscle tone.  Skin: Skin is warm and dry. No rash noted. There is erythema.  Psychiatric: She has a normal mood and affect. Her behavior is normal. Judgment and thought content normal.     Assessment/Plan This is an 84 year old female admitted for cellulitis. 1.  Cellulitis: Failure of outpatient therapy.  Vancomycin per pharmacy consult. 2.  Hypoxemia: Acute on chronic; oxygen saturations as low as 86%.  Continue supplemental oxygen via nasal cannula.  Continue inhaled corticosteroid 3.  Hypertension: Controlled; continue diltiazem 4.  Peripheral artery disease: Continue aspirin and Plavix 5.  Heart failure: Appears to be diastolic.  Continue Demadex and Bumex per home regimen. 6.  Hyperlipidemia: Continue statin therapy 7.  DVT prophylaxis: Lovenox 8.  GI prophylaxis: Pantoprazole per home regimen The patient is a full code.  Time spent on admission orders and patient care approximately 45 minutes  Harrie Foreman, MD 07/08/2017, 1:58 AM

## 2017-07-09 MED ORDER — POLYETHYLENE GLYCOL 3350 17 G PO PACK
17.0000 g | PACK | Freq: Every day | ORAL | Status: DC
  Administered 2017-07-09 – 2017-07-11 (×3): 17 g via ORAL
  Filled 2017-07-09 (×3): qty 1

## 2017-07-09 NOTE — Progress Notes (Signed)
Dr Darvin Neighbours notified that BP is 90/36.  Order given to hold am torsemide

## 2017-07-09 NOTE — Progress Notes (Signed)
[facility] [place] [facility] [place] NAME: Natasha [name]    MR#:  [id number]  DATE OF BIRTH:  1928/07/16  SUBJECTIVE:  Swelling and redness of left leg improving. Feels extremely weak.  REVIEW OF SYSTEMS:   Review of Systems  Constitutional: Negative for chills, fever and weight loss.  HENT: Negative for ear discharge, ear pain and nosebleeds.   Eyes: Negative for blurred vision, pain and discharge.  Respiratory: Negative for sputum production, shortness of breath, wheezing and stridor.   Cardiovascular: Positive for leg swelling. Negative for chest pain, palpitations, orthopnea and PND.  Gastrointestinal: Negative for abdominal pain, diarrhea, nausea and vomiting.  Genitourinary: Negative for frequency and urgency.  Musculoskeletal: Positive for joint pain. Negative for back pain.  Neurological: Negative for sensory change, speech change, focal weakness and weakness.  Psychiatric/Behavioral: Negative for depression and hallucinations. The patient is not nervous/anxious.    DRUG ALLERGIES:   Allergies  Allergen Reactions  . Codeine Diarrhea    Patient can tolerate small amounts of OXYCODONE per patient info sheet  . Hydrocodone Nausea And Vomiting    Patient can tolerate small amounts of OXYCODONE per patient info sheet  . Levofloxacin Other (See Comments)    Muscle cramps  . Tramadol Nausea And Vomiting    VITALS:  Blood pressure (!) 101/49, pulse 69, temperature 97.7 F (36.5 C), temperature source Oral, resp. rate 16, height 4\' 11"  (1.499 m), weight 63.8 kg (140 lb 10.5 oz), SpO2 (!) 86 %.  PHYSICAL EXAMINATION:   Physical Exam  GENERAL:  84 y.o.-year-old patient lying in the bed with no acute distress.  EYES: Pupils equal, round, reactive to light and accommodation. No scleral icterus. Extraocular muscles intact.  HEENT: Head atraumatic, normocephalic. Oropharynx and nasopharynx clear.  NECK:  Supple, no jugular venous  distention. No thyroid enlargement, no tenderness.  LUNGS: Normal breath sounds bilaterally, no wheezing, rales, rhonchi. No use of accessory muscles of respiration.  CARDIOVASCULAR: S1, S2 normal. No murmurs, rubs, or gallops.  ABDOMEN: Soft, nontender, nondistended. Bowel sounds present. No organomegaly or mass.  EXTREMITIES: No cyanosis, clubbing or edema b/l.   LEft LE edema with old bypass scarring with pinking skin tinge. No obvious skin breakdown neuro Cranial nerves II through XII are intact. No focal Motor or sensory deficits b/l.   PSYCHIATRIC:  patient is alert and oriented x 3.  SKIN: No obvious rash, lesion, or ulcer.   LABORATORY PANEL:  CBC Recent Labs  Lab 07/07/17 2038  WBC 9.1  HGB 14.1  HCT 41.8  PLT 230    Chemistries  Recent Labs  Lab 07/07/17 2038  NA 135  K 3.8  CL 99  CO2 24  GLUCOSE 104*  BUN 21  CREATININE 0.89  CALCIUM 9.2  AST 29  ALT 14  ALKPHOS 90  BILITOT 0.3   Cardiac Enzymes No results for input(s): TROPONINI in the last 168 hours. RADIOLOGY:  Dg Chest 2 View  Result Date: 07/07/2017 CLINICAL DATA:  84 year old female with bilateral leg swelling. History of DVT. EXAM: CHEST - 2 VIEW COMPARISON:  Chest radiograph dated 11/15/2016 FINDINGS: Stable mild eventration of the left hemidiaphragm bed there is no focal consolidation, pleural effusion, or pneumothorax. Stable mild cardiomegaly. A moderate size hiatal hernia noted. No acute osseous pathology. IMPRESSION: No active cardiopulmonary disease. Electronically Signed   By: Anner Crete M.D.   On: 07/07/2017 21:08   US Venous Img Lower Unilateral Left  Result Date: 07/08/2017 CLINICAL DATA:  Hypoxia. Left lower extremity pain and swelling for 3 weeks. EXAM: LEFT LOWER EXTREMITY VENOUS DOPPLER ULTRASOUND TECHNIQUE: Gray-scale sonography with graded compression, as well as color Doppler and duplex ultrasound were performed to evaluate the lower extremity deep venous systems from the level  of the common femoral vein and including the common femoral, femoral, profunda femoral, popliteal and calf veins including the posterior tibial, peroneal and gastrocnemius veins when visible. The superficial great saphenous vein was also interrogated. Spectral Doppler was utilized to evaluate flow at rest and with distal augmentation maneuvers in the common femoral, femoral and popliteal veins. COMPARISON:  06/29/2017 left lower extremity venous Doppler scan. FINDINGS: Contralateral Common Femoral Vein: Nonocclusive acute deep venous thrombosis in the right common femoral vein near the bifurcation. Common Femoral Vein: No evidence of thrombus. Normal compressibility, respiratory phasicity and response to augmentation. Saphenofemoral Junction: Not visualized. Profunda Femoral Vein: No evidence of thrombus. Normal compressibility and flow on color Doppler imaging. Femoral Vein: No evidence of thrombus. Normal compressibility, respiratory phasicity and response to augmentation. Popliteal Vein: No evidence of thrombus. Normal compressibility, respiratory phasicity and response to augmentation. Calf Veins: No evidence of thrombus. Normal compressibility and flow on color Doppler imaging. Venous Reflux:  None. Other Findings: Thin-walled 3.0 x 1.4 x 2.3 cm left groin fluid collection with no internal vascularity, previously 2.4 x 1.5 x 2.7 cm on 06/29/2017, not substantially changed since 11/15/2016 CT. IMPRESSION: 1. Nonocclusive acute deep venous thrombosis in the right common femoral vein near the bifurcation. 2. No evidence of deep venous thrombosis in the left lower extremity, noting nonvisualization of the saphenofemoral junction. 3. Stable chronic small left groin fluid collection compatible with postsurgical seroma related to prior arterial bypass graft surgery. Electronically Signed   By: Ilona Sorrel M.D.   On: 07/08/2017 01:04   ASSESSMENT AND PLAN:   Natasha [name] is a 84 y.o. female with a history  of CHF, peripheral vascular disease on Plavix, CAD, chronic kidney disease, hypertension, hypokalemia who presents for evaluation of bilateral leg pain.  Patient reports that she was recently treated for cellulitis.  She has finished 2 courses of antibiotics.  She does not remember the first 1 but the second 1 was doxycycline for which she was on for 10 days  1.  Left LE Cellulitis: just finish course of doxycyline prior to admission -no fever, leg not red/erythematous and BC neg with WBC normal -Likely discharge tomorrow on oral antibiotics  2.  Acute right CFV non occlusive thrombus -d/c plavix and start eliquis   3.  Hypertension: Controlled; continue diltiazem  4.  Peripheral artery disease: Continue aspirin   5.  ChronicHeart failure: Appears to be diastolic.  Continue Demadex per home regimen. -pt not in HF -she uses oxygen at night time  6.  Hyperlipidemia: Continue statin therapy  7.  DVT prophylaxis: Lovenox  Case discussed with Care Management/Social Worker. Management plans discussed with the patient, family and they are in agreement.  CODE STATUS: full  DVT Prophylaxis: eliquis  TOTAL TIME TAKING CARE OF THIS PATIENT: 30 minutes.   POSSIBLE D/C IN 1-2 DAYS, DEPENDING ON CLINICAL CONDITION.  Note: This dictation was prepared with Dragon dictation along with smaller phrase technology. Any transcriptional errors that result from this process are unintentional.  Neita Carp M.D on 07/09/2017 at 12:16 PM  Between 7am to 6pm - Pager - [phone]  After 6pm go to [email or web address] - password [email or web address] [facility]  Office  [phone]  CC:  Primary care physician; Rusty Aus, MDPatient ID: Natasha [name], female   DOB: 08-22-1928, 83 y.o.   MRN: [id number]

## 2017-07-09 NOTE — Progress Notes (Signed)
Patient said she no longer takes xanax.  She is requesting miralax for her bowels.  Dr Darvin Neighbours notified.  Xanax discontinued and miralax ordered

## 2017-07-09 NOTE — Progress Notes (Signed)
Patient has not complained of pain today unless I move her legs.  Husband is concerned about the patient coming home because he is 39 and is not sure if he will be able to give the assistance that she needs.  I explained to both of them that PT would be coming to work with the patient

## 2017-07-09 NOTE — Progress Notes (Signed)
BP low.  Order given to hold PM torsemide

## 2017-07-10 LAB — CBC
HEMATOCRIT: 35.6 % (ref 35.0–47.0)
Hemoglobin: 11.9 g/dL — ABNORMAL LOW (ref 12.0–16.0)
MCH: 30.3 pg (ref 26.0–34.0)
MCHC: 33.4 g/dL (ref 32.0–36.0)
MCV: 90.8 fL (ref 80.0–100.0)
PLATELETS: 196 10*3/uL (ref 150–440)
RBC: 3.93 MIL/uL (ref 3.80–5.20)
RDW: 15.9 % — AB (ref 11.5–14.5)
WBC: 6.5 10*3/uL (ref 3.6–11.0)

## 2017-07-10 LAB — CREATININE, SERUM
Creatinine, Ser: 0.87 mg/dL (ref 0.44–1.00)
GFR calc Af Amer: >60 mL/min (ref >60)
GFR, EST NON AFRICAN AMERICAN: 59 mL/min — AB (ref >60)

## 2017-07-10 NOTE — Progress Notes (Signed)
[facility] [place] NAME: Natasha [name]    MR#:  [id number]  DATE OF BIRTH:  08-Mar-1928  SUBJECTIVE:  Swelling and redness of left leg improving. Feels extremely weak.  Worked with PT. Now in chair  REVIEW OF SYSTEMS:   Review of Systems  Constitutional: Negative for chills, fever and weight loss.  HENT: Negative for ear discharge, ear pain and nosebleeds.   Eyes: Negative for blurred vision, pain and discharge.  Respiratory: Negative for sputum production, shortness of breath, wheezing and stridor.   Cardiovascular: Positive for leg swelling. Negative for chest pain, palpitations, orthopnea and PND.  Gastrointestinal: Negative for abdominal pain, diarrhea, nausea and vomiting.  Genitourinary: Negative for frequency and urgency.  Musculoskeletal: Positive for joint pain. Negative for back pain.  Neurological: Negative for sensory change, speech change, focal weakness and weakness.  Psychiatric/Behavioral: Negative for depression and hallucinations. The patient is not nervous/anxious.    DRUG ALLERGIES:   Allergies  Allergen Reactions  . Codeine Diarrhea    Patient can tolerate small amounts of OXYCODONE per patient info sheet  . Hydrocodone Nausea And Vomiting    Patient can tolerate small amounts of OXYCODONE per patient info sheet  . Levofloxacin Other (See Comments)    Muscle cramps  . Tramadol Nausea And Vomiting    VITALS:  Blood pressure 129/62, pulse 69, temperature 98 F (36.7 C), temperature source Oral, resp. rate 20, height 4\' 11"  (1.499 m), weight 63.8 kg (140 lb 10.5 oz), SpO2 95 %.  PHYSICAL EXAMINATION:   Physical Exam  GENERAL:  84 y.o.-year-old patient lying in the bed with no acute distress.  EYES: Pupils equal, round, reactive to light and accommodation. No scleral icterus. Extraocular muscles intact.  HEENT: Head atraumatic, normocephalic. Oropharynx and nasopharynx clear.  NECK:  Supple, no  jugular venous distention. No thyroid enlargement, no tenderness.  LUNGS: Normal breath sounds bilaterally, no wheezing, rales, rhonchi. No use of accessory muscles of respiration.  CARDIOVASCULAR: S1, S2 normal. No murmurs, rubs, or gallops.  ABDOMEN: Soft, nontender, nondistended. Bowel sounds present. No organomegaly or mass.  EXTREMITIES: No cyanosis, clubbing or edema b/l.   Left LE edema with old bypass scarring with pinking skin tinge. No obvious skin breakdown neuro Cranial nerves II through XII are intact. No focal Motor or sensory deficits b/l.   PSYCHIATRIC:  patient is alert and oriented x 3.  SKIN: No obvious rash, lesion, or ulcer.   LABORATORY PANEL:  CBC Recent Labs  Lab 07/10/17 0520  WBC 6.5  HGB 11.9*  HCT 35.6  PLT 196    Chemistries  Recent Labs  Lab 07/07/17 2038 07/10/17 0520  NA 135  --   K 3.8  --   CL 99  --   CO2 24  --   GLUCOSE 104*  --   BUN 21  --   CREATININE 0.89 0.87  CALCIUM 9.2  --   AST 29  --   ALT 14  --   ALKPHOS 90  --   BILITOT 0.3  --    Cardiac Enzymes No results for input(s): TROPONINI in the last 168 hours. RADIOLOGY:  No results found. ASSESSMENT AND PLAN:   Natasha [name] is a 84 y.o. female with a history of CHF, peripheral vascular disease on Plavix, CAD, chronic kidney disease, hypertension, hypokalemia who presents for evaluation of bilateral leg pain.  Patient reports that she was recently treated for cellulitis.  She has finished  2 courses of antibiotics.  She does not remember the first 1 but the second 1 was doxycycline for which she was on for 10 days  1.  Left LE Cellulitis: just finish course of doxycyline prior to admission -no fever, leg not red/erythematous and BC neg with WBC normal - D/C to SNF in AM on PO abx  2.  Acute right CFV non occlusive thrombus -d/c plavix and start eliquis   3.  Hypertension: Controlled; continue diltiazem  4.  Peripheral artery disease: Continue aspirin   5.   ChronicHeart failure: Appears to be diastolic.  Continue Demadex per home regimen. -pt not in HF -she uses oxygen at night time  6.  Hyperlipidemia: Continue statin therapy  7.  DVT prophylaxis: Lovenox  Case discussed with Care Management/Social Worker. Management plans discussed with the patient, family and they are in agreement.  CODE STATUS: Full  DVT Prophylaxis: eliquis  TOTAL TIME TAKING CARE OF THIS PATIENT: 30 minutes.   POSSIBLE D/C IN 1-2 DAYS, DEPENDING ON CLINICAL CONDITION.  Note: This dictation was prepared with Dragon dictation along with smaller phrase technology. Any transcriptional errors that result from this process are unintentional.  Natasha [name] M.D on 07/10/2017 at 1:23 PM  Between 7am to 6pm - Pager - [phone]  After 6pm go to [email or web address] - password [email or web address] [facility]  Office  [phone]  CC: Primary care physician; Natasha [name], MDPatient ID: Natasha [name], female   DOB: 04/07/1928, 84 y.o.   MRN: [id number]

## 2017-07-10 NOTE — Progress Notes (Signed)
Physical Therapy Evaluation Patient Details Name: Natasha [name] MRN: [id number] DOB: [birth year] Today's Date: 07/10/2017   History of Present Illness  Pt. is an 84 year old female with a PMH significant for CHF, PVD, CAD, chronic kidney disease, HTN and hypokalemia. Pt. was admitted to [facility] with bilateral leg pain, had recently been treated for cellulitis. Pt. reports that she has a start of care at [facility] that was put on hold due to her recent admission and can return there for a few days, but wants to be able to go home.   Clinical Impression  Patient requires assistance for bed mobility, transfers and ambulation tasks. Patient is safe with pushing off from surface prior to mobility tasks, but did need increased cueing when approaching commode/chair in regards to not reaching out for chair/making sure that her legs are touching the supporting surface prior to sitting down. Patient did require increased time with ambulation; RN was present throughout evaluation and patient was observed to be breathing heavily with ambulation. Patient's O2 sats were 83% with ambulation; it did improve after sitting once patient was placed on 4L O2 (improved to 90% within a few minutes of being placed on oxygen. Patient may benefit from continued PT while at [facility] to address strength, mobility, ambulation and transfers.     Follow Up Recommendations SNF(depending on progress)    Equipment Recommendations  (Pt. currently uses a rollator)    Recommendations for Other Services       Precautions / Restrictions Precautions Precautions: Fall Precaution Comments: Spoke with RN prior to treatment in regards to non occlusive thrombus in R CFV. Pt. was placed on Eliquis and is appropriate for PT.  Restrictions Weight Bearing Restrictions: No      Mobility  Bed Mobility Overal bed mobility: Needs Assistance Bed Mobility: Supine to Sit     Supine to sit: Mod assist     General bed mobility comments:  RN present and assisted with supine to sit  Transfers Overall transfer level: Needs assistance Equipment used: Rolling walker (2 wheeled) Transfers: Sit to/from Stand Sit to Stand: Min assist(increased time needed)         General transfer comment: Pt. is safe with pushing from supporting surface, but did try to reach for the commode handle/had difficulty with turning the rolling walker. Increased cueing needed with commode transfer as well as with approaching chair/commode in regards to turning walking, making sure her legs were touching the supporting surface prior to sitting  Ambulation/Gait Ambulation/Gait assistance: Min assist Gait Distance (Feet): 14 Feet Assistive device: Rolling walker (2 wheeled) Gait Pattern/deviations: Decreased step length - right;Decreased step length - left;Step-to pattern(LLE in ER; increased time; increased trunk flexion)        Stairs            Wheelchair Mobility    Modified Rankin (Stroke Patients Only)       Balance Overall balance assessment: Needs assistance(balance not formally assessed)                                           Pertinent Vitals/Pain Pain Assessment: 0-10 Pain Score: 2  Pain Location: legs Pain Intervention(s): Limited activity within patient's tolerance    Home Living Family/patient expects to be discharged to:: Assisted living Living Arrangements: Spouse/significant other             Home Equipment: (rollator)  Additional Comments: [facility]    Prior Function Level of Independence: Needs assistance         Comments: Pt. reports that she lives in a 1 story home and uses a rollator for mobility. She states that has has no steps to enter and OT has recently fixed her bathtub so she has something to sit on, in addition to grab bars present.      Hand Dominance        Extremity/Trunk Assessment        Lower Extremity Assessment Lower Extremity  Assessment: Generalized weakness(Not formally assessed, as pt. was assisted to the commode)       Communication   Communication: HOH(pt. has a cochlear implant)  Cognition Arousal/Alertness: Awake/alert Behavior During Therapy: WFL for tasks assessed/performed Overall Cognitive Status: Within Functional Limits for tasks assessed                                        General Comments General comments (skin integrity, edema, etc.): Pt. did have an area on her R shin that was bleeding; RN placed dressing on it    Exercises     Assessment/Plan    PT Assessment Patient needs continued PT services  PT Problem List Decreased strength;Decreased activity tolerance;Decreased balance;Decreased safety awareness;Cardiopulmonary status limiting activity       PT Treatment Interventions Functional mobility training;DME instruction    PT Goals (Current goals can be found in the Care Plan section)  Acute Rehab PT Goals Patient Stated Goal: To get home PT Goal Formulation: With patient Time For Goal Achievement: 07/24/17 Potential to Achieve Goals: Good    Frequency     Barriers to discharge        Co-evaluation               AM-PAC PT "6 Clicks" Daily Activity  Outcome Measure Difficulty turning over in bed (including adjusting bedclothes, sheets and blankets)?: A Lot Difficulty moving from lying on back to sitting on the side of the bed? : A Lot Difficulty sitting down on and standing up from a chair with arms (e.g., wheelchair, bedside commode, etc,.)?: A Little Help needed moving to and from a bed to chair (including a wheelchair)?: A Little Help needed walking in hospital room?: A Little Help needed climbing 3-5 steps with a railing? : Total 6 Click Score: 14    End of Session Equipment Utilized During Treatment: Gait belt Activity Tolerance: Patient tolerated treatment well Patient left: in chair;with call bell/phone within reach;with chair alarm  set(RN present through treatment; 4L O2 on pt. at conclusion)   PT Visit Diagnosis: Other abnormalities of gait and mobility (R26.89);Muscle weakness (generalized) (M62.81);Difficulty in walking, not elsewhere classified (R26.2)    Time: 1120-1200 PT Time Calculation (min) (ACUTE ONLY): 40 min   Charges:   PT Evaluation $PT Eval Low Complexity: 1 Low PT Treatments $Therapeutic Activity: 8-22 mins   PT G Codes:        Bertram Denver, PT, DPT, CWCE  Ladell Pier Maquita Sandoval 07/10/2017, 12:58 PM

## 2017-07-10 NOTE — Clinical Social Work Note (Signed)
Clinical Social Work Assessment  Patient Details  Name: Natasha [name] MRN: [id number] Date of Birth: 10-14-1928  Date of referral:  07/10/17               Reason for consult:  Facility Placement                Permission sought to share information with:  Chartered certified accountant granted to share information::  Yes, Verbal Permission Granted  Name::        Agency::  [facility]  Relationship::     Contact Information:     Housing/Transportation Living arrangements for the past 2 months:  [place] of Information:  Patient, Medical Team Patient Interpreter Needed:  None Criminal Activity/Legal Involvement Pertinent to Current Situation/Hospitalization:  No - Comment as needed Significant Relationships:  Spouse Lives with:  Spouse Do you feel safe going back to the place where you live?  Yes Need for family participation in patient care:  No (Coment)  Care giving concerns:  PT recommendation for SNF   Social Worker assessment / plan:  The CSW visited the patient at bedside to discuss discharge planning and PT recommendation for SNF. The patient shared that she is a resident of [facility]. The CSW explained that the patient is classified as inpatient and will have a qualifying 3 midnight stay for SNF placement as opposed to her last admission in 11/2016 which was observation status. The patient agreed to discharge to [facility] when stable.  The CSW has sent the request for admission to [facility] and will follow up tomorrow with the admissions coordinator. The patient will most likely discharge tomorrow and has requested that [facility] provide transportation.  Employment status:  Retired Forensic scientist:  [employer] PT Recommendations:  [place] / Referral to community resources:  [place]  Patient/Family's Response to care:  The patient thanked the  CSW.  Patient/Family's Understanding of and Emotional Response to Diagnosis, Current Treatment, and Prognosis:  The patient understands the discharge plan and is in agreement.  Emotional Assessment Appearance:  Appears stated age Attitude/Demeanor/Rapport:  Engaged, Gracious Affect (typically observed):  Accepting, Stable, Pleasant Orientation:  Oriented to Self, Oriented to  Time, Oriented to Situation, Oriented to Place Alcohol / Substance use:  Never Used Psych involvement (Current and /or in the community):  No (Comment)  Discharge Needs  Concerns to be addressed:  Discharge Planning Concerns, Care Coordination Readmission within the last 30 days:  No Current discharge risk:  None Barriers to Discharge:  Continued Medical Work up   [employer], LCSW 07/10/2017, 3:28 PM

## 2017-07-11 MED ORDER — APIXABAN 5 MG PO TABS: 5.0000 mg | ORAL_TABLET | Freq: Two times a day (BID) | ORAL | 0 refills | Status: DC

## 2017-07-11 MED ORDER — ALPRAZOLAM 0.25 MG PO TABS: 0.2500 mg | ORAL_TABLET | Freq: Two times a day (BID) | ORAL | 0 refills | Status: DC

## 2017-07-11 MED ORDER — SULFAMETHOXAZOLE-TRIMETHOPRIM 800-160 MG PO TABS: 1.0000 | ORAL_TABLET | Freq: Two times a day (BID) | ORAL | 0 refills | Status: DC

## 2017-07-11 MED ORDER — OXYCODONE-ACETAMINOPHEN 5-325 MG PO TABS: 1.0000 | ORAL_TABLET | Freq: Four times a day (QID) | ORAL | 0 refills | Status: DC | PRN

## 2017-07-11 MED ORDER — TEMAZEPAM 15 MG PO CAPS: 15.0000 mg | ORAL_CAPSULE | Freq: Every evening | ORAL | 0 refills | Status: DC | PRN

## 2017-07-11 NOTE — Care Management Important Message (Signed)
Copy of signed IM left with patient in room.  

## 2017-07-11 NOTE — Discharge Summary (Addendum)
[facility] [place] NAME: Natasha [name]    MR#:  [id number]  DATE OF BIRTH:  [birth year]  DATE OF ADMISSION:  07/07/2017 ADMITTING PHYSICIAN: Harrie Foreman, MD  DATE OF DISCHARGE: 07/11/2017  PRIMARY CARE PHYSICIAN: Rusty Aus, MD   ADMISSION DIAGNOSIS:  Cellulitis of lower extremity, unspecified laterality [H41.740]  DISCHARGE DIAGNOSIS:  Active Problems:   Cellulitis   SECONDARY DIAGNOSIS:   Past Medical History:  Diagnosis Date  . Anemia   . Arthritis   . Asthma   . Bilateral pneumonia may 2017  . Bronchitis   . Cardiomegaly   . Chronic respiratory failure with hypoxia ([place])   . Cochlear implant in place    bilateral, [name], 04/26/2007   . Gallstones 2015  . GERD (gastroesophageal reflux disease)   . H/O blood clots 2014  . History of home oxygen therapy    at night  . Hypertension   . Hypokalemia   . Peripheral vascular disease ([place])    with arterial clots- on xarelto  . Polyneuropathy   . Renal disorder    Chronic Kidney Disease, Stage 5  . Restless leg syndrome      ADMITTING HISTORY  Chief Complaint: Cellulitis HPI: The patient with past medical history of hypertension, polyneuropathy, chronic respiratory failure, cardiomegaly, history of DVT and recent cellulitis presents the emergency department due to worsening swelling and redness of her left leg.  Patient has been on 2 rounds of antibiotics yet continues to have pain and increasing erythema of the left lower extremity.  The patient denies fever, nausea or vomiting.  Ultrasound of the left lower extremity on this encounter shows a nonocclusive thrombus of the right femoral vein.  The patient received analgesia in the emergency department but due to spreading erythema and cellulitis the emergency department staff called the hospitalist service for admission.    HOSPITAL COURSE:   Natasha Cosman Corsiniis a 83 y.o.femalewith a history of  CHF, peripheral vascular disease on Plavix, CAD, chronic kidney disease, hypertension, hypokalemia who presents for evaluation of bilateral leg pain. Patient reports that she was recently treated for cellulitis. She has finished 2 courses of antibiotics. She does not remember the first 1 but the second 1 was doxycycline for which she was on for 10 days  *Left LE Cellulitis: just finish course of doxycyline prior to admission -  BC neg with WBC normal Treated with IV vancomycin and will change to PO bactrim at discharge - D/C to SNF in AM on PO abx  2. Acute right CFV non occlusive thrombus -d/c plavix and started eliquis  10 mg BID till 07/15/2017 and then 5 mg BID for 6 months  3. Hypertension: Controlled; continue diltiazem  4. Peripheral artery disease: Continue aspirin   5. ChronicHeart failure,  diastolic. Continue Demadex per home regimen.  6. Hyperlipidemia: Continue statin therapy  Continue Oxygen 2 l/min that she wears at home.  Stable for discharge to SNF  CONSULTS OBTAINED:    DRUG ALLERGIES:   Allergies  Allergen Reactions  . Codeine Diarrhea    Patient can tolerate small amounts of OXYCODONE per patient info sheet  . Hydrocodone Nausea And Vomiting    Patient can tolerate small amounts of OXYCODONE per patient info sheet  . Levofloxacin Other (See Comments)    Muscle cramps  . Tramadol Nausea And Vomiting    DISCHARGE MEDICATIONS:   Allergies as of 07/11/2017      Reactions  Codeine Diarrhea   Patient can tolerate small amounts of OXYCODONE per patient info sheet   Hydrocodone Nausea And Vomiting   Patient can tolerate small amounts of OXYCODONE per patient info sheet   Levofloxacin Other (See Comments)   Muscle cramps   Tramadol Nausea And Vomiting      Medication List    STOP taking these medications   bumetanide 1 MG tablet Commonly known as:  BUMEX   clopidogrel 75 MG tablet Commonly known as:  PLAVIX     TAKE these  medications   acetaminophen 325 MG tablet Commonly known as:  TYLENOL Take 650 mg by mouth every 6 (six) hours as needed for mild pain.   albuterol 108 (90 Base) MCG/ACT inhaler Commonly known as:  PROVENTIL HFA;VENTOLIN HFA Inhale 2 puffs into the lungs 2 (two) times daily as needed for wheezing or shortness of breath.   albuterol (2.5 MG/3ML) 0.083% nebulizer solution Commonly known as:  PROVENTIL Take 3 mLs (2.5 mg total) by nebulization every 6 (six) hours as needed for wheezing or shortness of breath.   ALPRAZolam 0.25 MG tablet Commonly known as:  XANAX Take 1 tablet (0.25 mg total) by mouth 2 (two) times daily.   apixaban 5 MG Tabs tablet Commonly known as:  ELIQUIS Take 1 tablet (5 mg total) by mouth 2 (two) times daily. Start taking on:  07/15/2017   aspirin EC 325 MG tablet Take 325 mg by mouth at bedtime.   budesonide 180 MCG/ACT inhaler Commonly known as:  PULMICORT Inhale 2 puffs into the lungs 2 (two) times daily.   Calcium 600-200 MG-UNIT tablet Take 1 tablet by mouth 2 (two) times daily.   diltiazem 180 MG 24 hr capsule Commonly known as:  CARDIZEM CD Take 180 mg by mouth every morning.   diphenoxylate-atropine 2.5-0.025 MG tablet Commonly known as:  LOMOTIL Take 0.5 tablets by mouth 2 (two) times daily as needed for diarrhea or loose stools.   levalbuterol 45 MCG/ACT inhaler Commonly known as:  XOPENEX HFA Inhale 2 puffs into the lungs every 4 (four) hours as needed for wheezing.   magnesium oxide 400 MG tablet Commonly known as:  MAG-OX Take 400 mg by mouth daily.   Melatonin 5 MG Tabs Take 5 mg by mouth at bedtime.   montelukast 10 MG tablet Commonly known as:  SINGULAIR Take 10 mg by mouth every morning.   NEXIUM 20 MG capsule Generic drug:  esomeprazole Take 20 mg by mouth 2 (two) times daily.   oxyCODONE-acetaminophen 5-325 MG tablet Commonly known as:  PERCOCET/ROXICET Take 1 tablet by mouth every 6 (six) hours as needed for moderate  pain or severe pain. What changed:  when to take this   potassium chloride SA 20 MEQ tablet Commonly known as:  K-DUR,KLOR-CON Take 1 tablet (20 mEq total) by mouth See admin instructions. Take 2 tablets (40 mEq) by mouth every morning, Take 20 mEq by mouth every afternoon, and Take 2 tablets (40 mEq) by mouth every night at bedtime.   pramipexole 0.5 MG tablet Commonly known as:  MIRAPEX Take 0.5 mg by mouth 2 (two) times daily.   simvastatin 20 MG tablet Commonly known as:  ZOCOR Take 20 mg by mouth at bedtime.   sulfamethoxazole-trimethoprim 800-160 MG tablet Commonly known as:  BACTRIM DS,SEPTRA DS Take 1 tablet by mouth 2 (two) times daily.   temazepam 15 MG capsule Commonly known as:  RESTORIL Take 1 capsule (15 mg total) by mouth at bedtime as needed  for sleep.   torsemide 20 MG tablet Commonly known as:  DEMADEX Take 1 tablet (20 mg total) by mouth 2 (two) times daily.   VITAMIN D-1000 MAX ST 1000 units tablet Generic drug:  Cholecalciferol Take 1,000 Units by mouth 2 (two) times daily.       Today   VITAL SIGNS:  Blood pressure (!) 112/46, pulse 72, temperature 98.5 F (36.9 C), temperature source Oral, resp. rate 18, height 4\' 11"  (1.499 m), weight 62 kg (136 lb 11 oz), SpO2 92 %.  I/O:    Intake/Output Summary (Last 24 hours) at 07/11/2017 1129 Last data filed at 07/11/2017 1008 Gross per 24 hour  Intake 600 ml  Output 3700 ml  Net -3100 ml    PHYSICAL EXAMINATION:  Physical Exam  GENERAL:  84 y.o.-year-old patient lying in the bed with no acute distress.  LUNGS: Normal breath sounds bilaterally, no wheezing, rales,rhonchi or crepitation. No use of accessory muscles of respiration.  CARDIOVASCULAR: S1, S2 normal. No murmurs, rubs, or gallops.  ABDOMEN: Soft, non-tender, non-distended. Bowel sounds present. No organomegaly or mass.  NEUROLOGIC: Moves all 4 extremities. PSYCHIATRIC: The patient is alert and oriented x 3.  SKIN: Old scar from CARG on  LLE  DATA REVIEW:   CBC Recent Labs  Lab 07/10/17 0520  WBC 6.5  HGB 11.9*  HCT 35.6  PLT 196    Chemistries  Recent Labs  Lab 07/07/17 2038 07/10/17 0520  NA 135  --   K 3.8  --   CL 99  --   CO2 24  --   GLUCOSE 104*  --   BUN 21  --   CREATININE 0.89 0.87  CALCIUM 9.2  --   AST 29  --   ALT 14  --   ALKPHOS 90  --   BILITOT 0.3  --     Cardiac Enzymes No results for input(s): TROPONINI in the last 168 hours.  Microbiology Results  Results for orders placed or performed during the hospital encounter of 07/07/17  Culture, blood (Routine x 2)     Status: None (Preliminary result)   Collection Time: 07/07/17  8:41 PM  Result Value Ref Range Status   Specimen Description BLOOD RIGHT ANTECUBITAL  Final   Special Requests Blood Culture adequate volume  Final   Culture   Final    NO GROWTH 4 DAYS Performed at [facility], [address] [place], [place] [address]    Report Status PENDING  Incomplete  Culture, blood (Routine x 2)     Status: None (Preliminary result)   Collection Time: 07/07/17 10:41 PM  Result Value Ref Range Status   Specimen Description BLOOD LAC  Final   Special Requests   Final    BOTTLES DRAWN AEROBIC AND ANAEROBIC Blood Culture results may not be optimal due to an excessive volume of blood received in culture bottles   Culture   Final    NO GROWTH 4 DAYS Performed at [facility], [address], [place], [place] [address]    Report Status PENDING  Incomplete    RADIOLOGY:  No results found.  Follow up with PCP in 1 week.  Management plans discussed with the patient, family and they are in agreement.  CODE STATUS:     Code Status Orders  (From admission, onward)        Start     Ordered   07/08/17 0251  Full code  Continuous  07/08/17 0250    Code Status History    Date Active Date Inactive Code Status Order ID Comments User Context   11/16/2016 0038 11/17/2016 1426 Full Code [id number]  Lance Coon, MD Inpatient   10/29/2016 1825 10/31/2016 1923 Full Code [id number]  Demetrios Loll, MD ED   03/27/2016 1540 03/31/2016 1937 Full Code [id number]  Idelle Crouch, MD Inpatient   01/30/2016 1806 02/02/2016 1520 Full Code [id number]  Henreitta Leber, MD Inpatient   08/24/2015 2116 08/25/2015 1024 Full Code [id number]  Gladstone Lighter, MD Inpatient   06/27/2015 1642 06/30/2015 1616 Partial Code [id number]  Bettey Costa, MD Inpatient   06/07/2015 2008 06/11/2015 1926 Full Code [id number]  Fritzi Mandes, MD Inpatient   05/22/2015 1352 05/24/2015 1612 Full Code [id number]  Algernon Huxley, MD Inpatient   03/11/2015 1820 03/16/2015 1415 Full Code [id number]  Fritzi Mandes, MD Inpatient   02/27/2015 1553 03/03/2015 1824 Full Code [id number]  Stegmayer, Janalyn Harder, PA-C Inpatient    Advance Directive Documentation     Most Recent Value  Type of Advance Directive  Living will  Pre-existing out of facility DNR order (yellow form or pink MOST form)  -  "MOST" Form in Place?  -      TOTAL TIME TAKING CARE OF THIS PATIENT ON DAY OF DISCHARGE: more than 30 minutes.   Leia Alf Allyssa Abruzzese M.D on 07/11/2017 at 11:29 AM  Between 7am to 6pm - Pager - [phone]  After 6pm go to [email or web address] - password [email or web address] [facility]  Office  [phone]  CC: Primary care physician; Rusty Aus, MD  Note: This dictation was prepared with Dragon dictation along with smaller phrase technology. Any transcriptional errors that result from this process are unintentional.

## 2017-07-12 DIAGNOSIS — I82401 Acute embolism and thrombosis of unspecified deep veins of right lower extremity: Secondary | ICD-10-CM | POA: Diagnosis not present

## 2017-07-12 DIAGNOSIS — G2581 Restless legs syndrome: Secondary | ICD-10-CM | POA: Diagnosis not present

## 2017-07-12 DIAGNOSIS — I739 Peripheral vascular disease, unspecified: Secondary | ICD-10-CM | POA: Diagnosis not present

## 2017-07-12 DIAGNOSIS — J9611 Chronic respiratory failure with hypoxia: Secondary | ICD-10-CM | POA: Diagnosis not present

## 2017-07-12 DIAGNOSIS — L03116 Cellulitis of left lower limb: Secondary | ICD-10-CM | POA: Diagnosis not present

## 2017-07-12 DIAGNOSIS — I10 Essential (primary) hypertension: Secondary | ICD-10-CM | POA: Diagnosis not present

## 2017-07-12 DIAGNOSIS — K219 Gastro-esophageal reflux disease without esophagitis: Secondary | ICD-10-CM | POA: Diagnosis not present

## 2017-07-12 LAB — CULTURE, BLOOD (ROUTINE X 2)
CULTURE: NO GROWTH
Culture: NO GROWTH
Special Requests: ADEQUATE

## 2017-07-13 ENCOUNTER — Ambulatory Visit: Payer: Medicare Other

## 2018-01-11 ENCOUNTER — Other Ambulatory Visit: Payer: Self-pay

## 2018-01-11 ENCOUNTER — Emergency Department
Admission: EM | Admit: 2018-01-11 | Discharge: 2018-01-12 | Disposition: A | Discharge status: Other Acute Inpatient Hospital | Payer: Medicare Other | Source: Home / Self Care | Attending: Emergency Medicine | Admitting: Emergency Medicine

## 2018-01-11 DIAGNOSIS — Z9049 Acquired absence of other specified parts of digestive tract: Secondary | ICD-10-CM | POA: Insufficient documentation

## 2018-01-11 DIAGNOSIS — I120 Hypertensive chronic kidney disease with stage 5 chronic kidney disease or end stage renal disease: Secondary | ICD-10-CM

## 2018-01-11 DIAGNOSIS — Z87891 Personal history of nicotine dependence: Secondary | ICD-10-CM

## 2018-01-11 DIAGNOSIS — N281 Cyst of kidney, acquired: Secondary | ICD-10-CM | POA: Insufficient documentation

## 2018-01-11 DIAGNOSIS — I998 Other disorder of circulatory system: Secondary | ICD-10-CM | POA: Insufficient documentation

## 2018-01-11 DIAGNOSIS — I739 Peripheral vascular disease, unspecified: Secondary | ICD-10-CM

## 2018-01-11 DIAGNOSIS — N185 Chronic kidney disease, stage 5: Secondary | ICD-10-CM

## 2018-01-11 DIAGNOSIS — J45909 Unspecified asthma, uncomplicated: Secondary | ICD-10-CM | POA: Insufficient documentation

## 2018-01-11 DIAGNOSIS — Z79899 Other long term (current) drug therapy: Secondary | ICD-10-CM

## 2018-01-11 DIAGNOSIS — Z7982 Long term (current) use of aspirin: Secondary | ICD-10-CM

## 2018-01-11 DIAGNOSIS — Z7901 Long term (current) use of anticoagulants: Secondary | ICD-10-CM

## 2018-01-11 DIAGNOSIS — M79605 Pain in left leg: Secondary | ICD-10-CM

## 2018-01-11 DIAGNOSIS — Z9621 Cochlear implant status: Secondary | ICD-10-CM

## 2018-01-11 NOTE — ED Triage Notes (Signed)
Pt stated that she got up from her chair about 2 hrs ago and she started having pain from her left hip that radiates down into her toes on that side. Pt does have some redness and swelling noted to lower leg and foot, that has been there for the past few days per patient. Pt denies any falls or any known injury.

## 2018-01-12 ENCOUNTER — Emergency Department: Payer: Medicare Other

## 2018-01-12 LAB — CBC WITH DIFFERENTIAL/PLATELET
ABS IMMATURE GRANULOCYTES: 0.02 10*3/uL (ref 0.00–0.07)
Basophils Absolute: 0 10*3/uL (ref 0.0–0.1)
Basophils Relative: 1 %
Eosinophils Absolute: 0.1 10*3/uL (ref 0.0–0.5)
Eosinophils Relative: 1 %
HEMATOCRIT: 38.5 % (ref 36.0–46.0)
Hemoglobin: 12.2 g/dL (ref 12.0–15.0)
IMMATURE GRANULOCYTES: 0 %
LYMPHS ABS: 1.4 10*3/uL (ref 0.7–4.0)
Lymphocytes Relative: 22 %
MCH: 28.4 pg (ref 26.0–34.0)
MCHC: 31.7 g/dL (ref 30.0–36.0)
MCV: 89.5 fL (ref 80.0–100.0)
Monocytes Absolute: 0.6 10*3/uL (ref 0.1–1.0)
Monocytes Relative: 9 %
NEUTROS ABS: 4.1 10*3/uL (ref 1.7–7.7)
NEUTROS PCT: 67 %
NRBC: 0 % (ref 0.0–0.2)
PLATELETS: 243 10*3/uL (ref 150–400)
RBC: 4.3 MIL/uL (ref 3.87–5.11)
RDW: 16.2 % — ABNORMAL HIGH (ref 11.5–15.5)
WBC: 6.2 10*3/uL (ref 4.0–10.5)

## 2018-01-12 LAB — BASIC METABOLIC PANEL
ANION GAP: 9 (ref 5–15)
BUN: 32 mg/dL — ABNORMAL HIGH (ref 8–23)
CALCIUM: 9.3 mg/dL (ref 8.9–10.3)
CO2: 24 mmol/L (ref 22–32)
Chloride: 103 mmol/L (ref 98–111)
Creatinine, Ser: 0.93 mg/dL (ref 0.44–1.00)
GFR, EST NON AFRICAN AMERICAN: 55 mL/min — AB (ref >60)
GLUCOSE: 100 mg/dL — AB (ref 70–99)
POTASSIUM: 4.1 mmol/L (ref 3.5–5.1)
Sodium: 136 mmol/L (ref 135–145)

## 2018-01-12 LAB — PROTIME-INR
INR: 0.95
INR: 0.97
PROTHROMBIN TIME: 12.8 s (ref 11.4–15.2)
Prothrombin Time: 12.6 seconds (ref 11.4–15.2)

## 2018-01-12 LAB — TROPONIN I: Troponin I: <0.03 ng/mL (ref <0.03)

## 2018-01-12 LAB — APTT: aPTT: 27 seconds (ref 24–36)

## 2018-01-12 LAB — CK: Total CK: 58 U/L (ref 38–234)

## 2018-01-12 LAB — LACTIC ACID, PLASMA: LACTIC ACID, VENOUS: 1.9 mmol/L (ref 0.5–1.9)

## 2018-01-12 LAB — HEPARIN LEVEL (UNFRACTIONATED): Heparin Unfractionated: 1.91 IU/mL — ABNORMAL HIGH (ref 0.30–0.70)

## 2018-01-12 MED ORDER — IOPAMIDOL (ISOVUE-370) INJECTION 76%
125.0000 mL | Freq: Once | INTRAVENOUS | Status: AC | PRN
  Administered 2018-01-12: 125 mL via INTRAVENOUS

## 2018-01-12 MED ORDER — HEPARIN SODIUM (PORCINE) 5000 UNIT/ML IJ SOLN: 60.0000 [IU]/kg | Freq: Once | INTRAMUSCULAR | Status: DC

## 2018-01-12 MED ORDER — HEPARIN (PORCINE) 25000 UT/250ML-% IV SOLN
650.0000 [IU]/h | INTRAVENOUS | Status: DC
  Administered 2018-01-12: 650 [IU]/h via INTRAVENOUS
  Filled 2018-01-12: qty 250

## 2018-01-12 MED ORDER — SODIUM CHLORIDE 0.9 % IV BOLUS
1000.0000 mL | Freq: Once | INTRAVENOUS | Status: AC
  Administered 2018-01-12: 1000 mL via INTRAVENOUS

## 2018-01-12 MED ORDER — FENTANYL CITRATE (PF) 100 MCG/2ML IJ SOLN
50.0000 ug | Freq: Once | INTRAMUSCULAR | Status: AC
  Administered 2018-01-12: 50 ug via INTRAVENOUS
  Filled 2018-01-12: qty 2

## 2018-01-12 NOTE — ED Notes (Signed)
Doppler used to assist with looking for pedal pulses on left foot. Unable to find. Dr. Beather Arbour made aware.

## 2018-01-12 NOTE — Progress Notes (Addendum)
ANTICOAGULATION CONSULT NOTE - Initial Consult  Pharmacy Consult for heparin drip Indication: Ischemic leg  Allergies  Allergen Reactions  . Codeine Diarrhea    Patient can tolerate small amounts of OXYCODONE per patient info sheet  . Hydrocodone Nausea And Vomiting    Patient can tolerate small amounts of OXYCODONE per patient info sheet  . Levofloxacin Other (See Comments)    Muscle cramps  . Tramadol Nausea And Vomiting    Patient Measurements: Height: 4\' 11"  (149.9 cm) Weight: 132 lb (59.9 kg) IBW/kg (Calculated) : 43.2 Heparin Dosing Weight: 50 kg  Vital Signs: Temp: 97.7 F (36.5 C) (01/01 2335) Temp Source: Oral (01/01 2335) BP: 129/54 (01/02 0000) Pulse Rate: 86 (01/02 0000)  Labs: Recent Labs    01/12/18 0030  HGB 12.2  HCT 38.5  PLT 243  LABPROT 12.8  INR 0.97    CrCl cannot be calculated (Patient's most recent lab result is older than the maximum 21 days allowed.).   Medical History: Past Medical History:  Diagnosis Date  . Anemia   . Arthritis   . Asthma   . Bilateral pneumonia may 2017  . Bronchitis   . Cardiomegaly   . Chronic respiratory failure with hypoxia ([place])   . Cochlear implant in place    bilateral, [name], 04/26/2007   . Gallstones 2015  . GERD (gastroesophageal reflux disease)   . H/O blood clots 2014  . History of home oxygen therapy    at night  . Hypertension   . Hypokalemia   . Peripheral vascular disease ([place])    with arterial clots- on xarelto  . Polyneuropathy   . Renal disorder    Chronic Kidney Disease, Stage 5  . Restless leg syndrome     Medications:  Scheduled:    Assessment: Patient arrives to ER for pain radiating from hip to toes w/ redness and swelling. Patient going to CT at the moment; patient is on apixaban 5 mg bid PTA for previous VTE based on dosing (was on 10 mg bid for 7 days back in July). Patient is being started on heparin drip for probable ischemic leg.  Goal of Therapy:   Heparin level 0.3-0.7 units/ml Monitor platelets by anticoagulation protocol: Yes   Plan:  Since last dose of apixaban is unknown will omit bolus for now Will start rate at 650 units/hr  Baseline labs drawn--will assess baseline labs before ordering next level If anti-Xa initially elevated will dose off of aPTTs until both correlate. Will monitor daily CBC's and adjust per aPTT/anti-Xa levels.  01/02 @ 0100 baseline HL 1.91, will dose off of aPTT and will check it at 0800.  Tobie Lords, PharmD, BCPS Clinical Pharmacist 01/12/2018

## 2018-01-12 NOTE — ED Provider Notes (Signed)
[facility] Emergency Department Provider Note   ____________________________________________   First MD Initiated Contact with Patient 01/12/18 0018     (approximate)  I have reviewed the triage vital signs and the nursing notes.   HISTORY  Chief Complaint Leg Pain    HPI Natasha [name] is a 85 y.o. female brought to the ED from home via EMS with a chief complaint of nontraumatic left leg pain.  States she got up from a chair approximately 2 hours prior to arrival and started to experience pain from her left hip all the way into her toes.  Denies fall/injury/trauma.  Having some lower back pain which she typically has.  Left lower leg has some redness and swelling which patient states is baseline status post vein harvest.  Reports history of DVT.  Denies associated fever, chills, chest pain, shortness of breath, abdominal pain, nausea or vomiting.  Review of patient's chart demonstrates she has significant PAD; this is the notes from her vascular surgeon at [facility] from 06/2017:  She underwent percutaneous intervention by Dr. Leotis Pain- On 02/27/15, the patient underwent Ultrasound guidance for vascular access right femoral artery, catheter placement into left anterior tibial artery from right femoral approach, aortogram and selective left lower extremity angiogram, percutaneous transluminal angioplasty of distal bypass anastomosis and proximal anterior tibial artery with 3 mm diameter angioplasty balloon, catheter directed thrombolysis with 4 mg of TPA delivered throughout the femoral to distal bypass graft and proximal anterior tibial artery, mechanical thrombectomy using the Penumbra CAT 6 device to the left femoral to distal bypass and proximal anterior tibial artery with placement of infusion catheter for overnight thrombolysis.  On 02/27/14, the patient underwent an angiogram through existing catheter left lower extremity, catheter placement into  left dorsalis pedis artery from right femoral approach, selective left lower extremity angiogram, percutaneous transluminal angioplasty of distal anterior tibial artery and dorsalis pedis artery with 2 mm diameter angioplasty balloon, percutaneous transluminal angioplasty of distal anastomosis and proximal anterior tibial artery with 3.5 mm diameter angioplasty balloon, mechanical rheolytic thrombectomy to the mid to distal bypass graft, distal bypass anastomosis to the anterior tibial artery, and distal anterior tibial artery with the AngioJet Omni catheter for about 223 cc of effluent returned, StarClose closure device right femoral artery.  A follow up angiogram by Dr. Lucky Cowboy in May 2017- On 05/22/15, the patient underwent a Ultrasound guidance for vascular access right femoral artery, Catheter placement into left AT artery, Aortogram and selective left lower extremity angiogram, Infusion for thrombolysis with 4 mg of tpa delivered through an infusion catheter to the left leg bypass and AT artery, Placement of a 135 cm total length, 50 cm working length catheter for continued thrombolysis into the left leg bypass and AT artery. She was treated with TPA and heparin overnight and brought back to the endovascular suite the next day on 05/23/15 and underwent a Catheter placement into left anterior tibial artery from right femoral approach, selective left lower extremity angiogram, Mechanical thrombectomy with the penumbra cat-6 device to the femoral to anterior tibial artery bypass graft, distal anastomosis, and proximal anterior tibial artery, Percutaneous transluminal angioplasty of the distal anastomosis and proximal anterior tibial artery with 4 mm diameter by 8 cm length Lutonix drug-coated angioplasty balloon, StarClose closure device right femoral artery. She tolerated the procedure well and was transferred back to the ICU for Aggrastat infusion overnight. At the end of the second procedure the patients bypass  was open and revascularization of the  leg was achieved.   She states she had RT leg interventions beginning in 2007 through 2010 but this could have been just access location for left leg interventions.  In May 2017 she underwent a LT femoral peroneal bypass using GSV by Dr. Verner Mould at [facility] She required multiple leg debridements after the bypass but in May 2018 all wounds were healed.  She has problems with swelling in the left leg, occasional bullae and cellulitis.  She has had persistent pain and swelling in the leg. A CTA was done in March 2019 showing: A left femoral peroneal bypass via saphenous vein graft appears patent. The native left superficial femoral artery and popliteal artery, which contains multiple stents, is occluded. An additional left femoral popliteal bypass graft is also occluded. One vessel runoff to the ankle via the peroneal artery.    Past Medical History:  Diagnosis Date  . Anemia   . Arthritis   . Asthma   . Bilateral pneumonia may 2017  . Bronchitis   . Cardiomegaly   . Chronic respiratory failure with hypoxia ([place])   . Cochlear implant in place    bilateral, [name], 04/26/2007   . Gallstones 2015  . GERD (gastroesophageal reflux disease)   . H/O blood clots 2014  . History of home oxygen therapy    at night  . Hypertension   . Hypokalemia   . Peripheral vascular disease ([place])    with arterial clots- on xarelto  . Polyneuropathy   . Renal disorder    Chronic Kidney Disease, Stage 5  . Restless leg syndrome     Patient Active Problem List   Diagnosis Date Noted  . Sacral fracture ([place]) 11/15/2016  . HTN (hypertension) 11/15/2016  . GERD (gastroesophageal reflux disease) 11/15/2016  . RLS (restless legs syndrome) 11/15/2016  . Asthma 11/15/2016  . Back pain 10/29/2016  . CAP (community acquired pneumonia) 03/27/2016  . Cellulitis 03/27/2016  . Hypokalemia 03/27/2016  . Acute respiratory failure ([place]) 03/27/2016  . Sepsis  ([place]) 08/24/2015  . HCAP (healthcare-associated pneumonia) 06/27/2015  . Pneumonia 06/07/2015  . Hypotension 03/11/2015  . Lower extremity atheroembolism ([place]) 02/27/2015  . Ischemic leg 02/27/2015  . Calculus of gallbladder with other cholecystitis, without mention of obstruction 04/20/2013    Past Surgical History:  Procedure Laterality Date  . ABDOMINAL HYSTERECTOMY  1960  . APPENDECTOMY  1946  . BREAST BIOPSY Bilateral    negative  . CHOLECYSTECTOMY  04-24-13  . COCHLEAR IMPLANT  2009  . COLONOSCOPY  2015   Dr. Rayann Heman   . EYE SURGERY  2008,2009   cataract  . KYPHOPLASTY N/A 08/19/2015   Procedure: KYPHOPLASTY;  Surgeon: Hessie Knows, MD;  Location: [facility] ORS;  Service: Orthopedics;  Laterality: N/A;  . PERIPHERAL VASCULAR CATHETERIZATION N/A 02/27/2015   Procedure: Abdominal Aortogram w/Lower Extremity;  Surgeon: Algernon Huxley, MD;  Location: [place] [facility];  Service: Cardiovascular;  Laterality: N/A;  . PERIPHERAL VASCULAR CATHETERIZATION  02/27/2015   Procedure: Lower Extremity Intervention;  Surgeon: Algernon Huxley, MD;  Location: [place] [facility];  Service: Cardiovascular;;  . PERIPHERAL VASCULAR CATHETERIZATION Left 02/28/2015   Procedure: Lower Extremity Angiography;  Surgeon: Algernon Huxley, MD;  Location: [place] [facility];  Service: Cardiovascular;  Laterality: Left;  . PERIPHERAL VASCULAR CATHETERIZATION  02/28/2015   Procedure: Lower Extremity Intervention;  Surgeon: Algernon Huxley, MD;  Location: [place] [facility];  Service: Cardiovascular;;  . PERIPHERAL VASCULAR CATHETERIZATION Left 05/22/2015   Procedure:  Lower Extremity Angiography;  Surgeon: Algernon Huxley, MD;  Location: [place] [facility];  Service: Cardiovascular;  Laterality: Left;  . PERIPHERAL VASCULAR CATHETERIZATION  05/22/2015   Procedure: Lower Extremity Intervention;  Surgeon: Algernon Huxley, MD;  Location: [place] [facility];  Service: Cardiovascular;;  . PERIPHERAL VASCULAR CATHETERIZATION Left 05/23/2015    Procedure: Lower Extremity Angiography;  Surgeon: Algernon Huxley, MD;  Location: [place] [facility];  Service: Cardiovascular;  Laterality: Left;  . PERIPHERAL VASCULAR CATHETERIZATION  05/23/2015   Procedure: Lower Extremity Intervention;  Surgeon: Algernon Huxley, MD;  Location: [place] [facility];  Service: Cardiovascular;;  . stent placement  2006-2014   multiple stent placements in legs    Prior to Admission medications   Medication Sig Start Date End Date Taking? Authorizing Provider  albuterol (PROVENTIL HFA;VENTOLIN HFA) 108 (90 BASE) MCG/ACT inhaler Inhale 2 puffs into the lungs 2 (two) times daily as needed for wheezing or shortness of breath.    Yes [provider]  apixaban (ELIQUIS) 5 MG TABS tablet Take 1 tablet (5 mg total) by mouth 2 (two) times daily. 10 mg BID till 07/15/2017 and then 5 mg BID Patient taking differently: Take 5 mg by mouth daily.  07/15/17  Yes Hillary Bow, MD  aspirin EC 81 MG tablet Take 81 mg by mouth at bedtime.    Yes [provider]  budesonide (PULMICORT) 180 MCG/ACT inhaler Inhale 2 puffs into the lungs 2 (two) times daily.   Yes [provider]  calcium carbonate (OSCAL) 1500 (600 Ca) MG TABS tablet Take 600 mg of elemental calcium by mouth 2 (two) times daily with a meal.   Yes [provider]  Cholecalciferol (VITAMIN D-1000 MAX ST) 1000 units tablet Take 1,000 Units by mouth 2 (two) times daily.   Yes [provider]  diltiazem (CARDIZEM CD) 180 MG 24 hr capsule Take 180 mg by mouth every morning.    Yes [provider]  esomeprazole (NEXIUM) 20 MG capsule Take 20 mg by mouth 2 (two) times daily.    Yes [provider]  magnesium oxide (MAG-OX) 400 MG tablet Take 400 mg by mouth daily.   Yes [provider]  Melatonin 5 MG TABS Take 5 mg by mouth at bedtime.    Yes [provider]  montelukast (SINGULAIR) 10 MG tablet Take 10 mg by mouth every morning.    Yes [provider]  potassium chloride SA (K-DUR,KLOR-CON) 20 MEQ tablet Take 1 tablet (20 mEq total) by mouth See admin instructions. Take 2 tablets (40 mEq) by mouth every morning, Take 20 mEq by mouth every afternoon, and Take 2 tablets (40 mEq) by mouth every night at bedtime. Patient taking differently: Take 20-40 mEq by mouth See admin instructions. Take 2 tablets (40 mEq) by mouth every morning, Take 20 mEq by mouth every afternoon, and Take 2 tablets (40 mEq) by mouth every night at bedtime. 03/31/16  Yes Wieting, Richard, MD  pramipexole (MIRAPEX) 0.5 MG tablet Take 0.5 mg by mouth 2 (two) times daily. 06/30/15  Yes [provider]  simvastatin (ZOCOR) 20 MG tablet Take 20 mg by mouth at bedtime. 07/03/15  Yes [provider]  torsemide (DEMADEX) 20 MG tablet Take 1 tablet (20 mg total) by mouth 2 (two) times daily. 10/31/16  Yes Epifanio Lesches, MD  acetaminophen (TYLENOL) 325 MG tablet Take 650 mg by mouth every 6 (six) hours as needed for mild pain.  [provider]  albuterol (PROVENTIL) (2.5 MG/3ML) 0.083% nebulizer solution Take 3 mLs (2.5 mg total) by nebulization every 6 (six) hours as needed for wheezing or shortness of breath. Patient not taking: Reported on 10/29/2016 06/11/15   Loletha Grayer, MD  ALPRAZolam Duanne Moron) 0.25 MG tablet Take 1 tablet (0.25 mg total) by mouth 2 (two) times daily. Patient not taking: Reported on 01/12/2018 07/11/17   Hillary Bow, MD  oxyCODONE-acetaminophen (PERCOCET/ROXICET) 5-325 MG tablet Take 1 tablet by mouth every 6 (six) hours as needed for moderate pain or severe pain. Patient not taking: Reported on 01/12/2018 07/11/17   Hillary Bow, MD  sulfamethoxazole-trimethoprim (BACTRIM DS,SEPTRA DS) 800-160 MG tablet Take 1 tablet by mouth 2 (two) times daily. Patient not taking: Reported on 01/12/2018 07/11/17   Hillary Bow, MD  temazepam (RESTORIL) 15 MG capsule Take 1 capsule (15 mg total) by mouth at bedtime as needed for  sleep. Patient not taking: Reported on 01/12/2018 07/11/17   Hillary Bow, MD    Allergies Codeine; Hydrocodone; Levofloxacin; and Tramadol  Family History  Problem Relation Age of Onset  . Heart disease Mother   . Heart disease Father   . Cancer Sister        lung  . Breast cancer Daughter     Social History Social History   Tobacco Use  . Smoking status: Former Smoker    Packs/day: 1.00    Years: 25.00    Pack years: 25.00    Types: Cigarettes  . Smokeless tobacco: Never Used  Substance Use Topics  . Alcohol use: No  . Drug use: No    Review of Systems  Constitutional: No fever/chills Eyes: No visual changes. ENT: No sore throat. Cardiovascular: Denies chest pain. Respiratory: Denies shortness of breath. Gastrointestinal: No abdominal pain.  No nausea, no vomiting.  No diarrhea.  No constipation. Genitourinary: Negative for dysuria. Musculoskeletal: Positive for left leg pain.  Negative for back pain. Skin: Negative for rash. Neurological: Negative for headaches, focal weakness or numbness.   ____________________________________________   PHYSICAL EXAM:  VITAL SIGNS: ED Triage Vitals  Enc Vitals Group     BP 01/11/18 2335 (!) 161/63     Pulse Rate 01/11/18 2335 89     Resp 01/11/18 2335 18     Temp 01/11/18 2335 97.7 F (36.5 C)     Temp Source 01/11/18 2335 Oral     SpO2 01/11/18 2335 92 %     Weight 01/11/18 2339 132 lb (59.9 kg)     Height 01/11/18 2339 4\' 11"  (1.499 m)     Head Circumference --      Peak Flow --      Pain Score 01/11/18 2338 10     Pain Loc --      Pain Edu? --      Excl. in [place]? --     Constitutional: Alert and oriented.  Frail appearing and in mild to moderate acute distress.  Tearful. Eyes: Conjunctivae are normal. PERRL. EOMI. Head: Atraumatic. Nose: No congestion/rhinnorhea. Mouth/Throat: Mucous membranes are moist.  Oropharynx non-erythematous. Neck: No stridor.   Cardiovascular: Normal rate, regular rhythm. Grossly  normal heart sounds.  Good peripheral circulation. Respiratory: Normal respiratory effort.  No retractions. Lungs CTAB. Gastrointestinal: Soft and nontender. No distention. No abdominal bruits. No CVA tenderness. Musculoskeletal:  LLE: 2+ femoral pulses.  Left foot pale and cold compared to the right.  Unable to palpate pulses.  Area of redness to lower leg without warmth or fluctuance. Neurologic:  Normal speech and language. No gross focal neurologic deficits are appreciated.  Skin:  Skin is warm, dry and intact. No rash noted. Psychiatric: Mood and affect are normal. Speech and behavior are normal.  ____________________________________________   LABS (all labs ordered are listed, but only abnormal results are displayed)  Labs Reviewed  CBC WITH DIFFERENTIAL/PLATELET - Abnormal; Notable for the following components:      Result Value   RDW 16.2 (*)    All other components within normal limits  BASIC METABOLIC PANEL - Abnormal; Notable for the following components:   Glucose, Bld 100 (*)    BUN 32 (*)    GFR calc non Af Amer 55 (*)    All other components within normal limits  HEPARIN LEVEL (UNFRACTIONATED) - Abnormal; Notable for the following components:   Heparin Unfractionated 1.91 (*)    All other components within normal limits  TROPONIN I  LACTIC ACID, PLASMA  PROTIME-INR  CK  APTT  PROTIME-INR  LACTIC ACID, PLASMA  APTT   ____________________________________________  EKG  ED ECG REPORT I, , J, the attending physician, personally viewed and interpreted this ECG.   Date: 01/12/2018  EKG Time: 0056  Rate: 85  Rhythm: normal EKG, normal sinus rhythm  Axis: Normal  Intervals:left anterior fascicular block  ST&T Change: Nonspecific  ____________________________________________  RADIOLOGY  ED MD interpretation: Chest x-ray with mild pulmonary edema; CT angiogram discussed with radiologist which is concerning for acute ischemia  Official radiology  report(s): Ct Angio Ao+bifem W & Or Wo Contrast  Result Date: 01/12/2018 CLINICAL DATA:  Acute onset of left hip pain, radiating down to the toes. Erythema and swelling about the lower leg and foot. EXAM: CT ANGIOGRAPHY OF ABDOMINAL AORTA WITH ILIOFEMORAL RUNOFF TECHNIQUE: Multidetector CT imaging of the abdomen, pelvis and lower extremities was performed using the standard protocol during bolus administration of intravenous contrast. Multiplanar CT image reconstructions and MIPs were obtained to evaluate the vascular anatomy. CONTRAST:  18mL ISOVUE-370 IOPAMIDOL (ISOVUE-370) INJECTION 76% COMPARISON:  CT of the abdomen and pelvis performed 11/11/2016 FINDINGS: VASCULAR Aorta: Scattered calcification is seen along the abdominal aorta, with minimal mural thrombus at distal descending thoracic aorta. No significant luminal narrowing is seen. Celiac: The celiac trunk appears patent, with mild calcification at the origin of the celiac trunk. SMA: The superior mesenteric artery remains patent, with minimal proximal narrowing. Renals: The renal arteries appear patent bilaterally, with calcification at the proximal left renal artery. IMA: The inferior mesenteric artery remains patent. RIGHT Lower Extremity Inflow: Scattered calcification is noted along the right common, internal and external iliac arteries. The right external iliac and common femoral arteries appear patent. Postoperative change is noted at the right inguinal region. Outflow: The profunda femoris artery remains patent. There is mild narrowing at the proximal aspect of the superficial femoral artery. Minimal mural thrombus is seen along the right superficial femoral artery stent. The right popliteal artery remains patent. Runoff: Note is made of patent three-vessel runoff to the level of the right ankle and foot. LEFT Lower Extremity Inflow: Scattered calcification is noted along the left common and external iliac arteries. Postoperative change is noted  at left inguinal region. Outflow: There is occlusion at the left superficial femoral artery, which contains multiple stents, and at the left superficial femoral artery bypass graft, and also of the left profunda femoris artery, and a small anterior collateral vessel. The stent graft and native superficial femoral artery remain fully occluded along their entire length. Runoff: The popliteal artery  remains occluded. However, there is reconstitution of 2 of the 3 vessels to the level of the distal left leg, likely from small collateral vessels. Veins: Visualized venous structures are grossly unremarkable. Review of the MIP images confirms the above findings. NON-VASCULAR Lower chest: There is a relatively large hiatal hernia, containing most of the stomach, with surrounding atelectasis. Mild left basilar airspace opacity also likely reflects atelectasis. The visualized portions of the mediastinum are unremarkable. Hepatobiliary: The liver is unremarkable in appearance. The patient is status post cholecystectomy, with clips noted at the gallbladder fossa. The common bile duct remains normal in caliber. Pancreas: The pancreas is within normal limits. Spleen: The spleen is unremarkable in appearance. Adrenals/Urinary Tract: The adrenal glands are unremarkable in appearance. Mild bilateral renal atrophy and scarring are noted. A small right renal cyst is noted. There is no evidence of hydronephrosis. No renal or ureteral stones are identified. No perinephric stranding is seen. Stomach/Bowel: The stomach is unremarkable in appearance. The small bowel is within normal limits. The patient is status post appendectomy. The colon is unremarkable in appearance. Lymphatic: No retroperitoneal or pelvic sidewall lymphadenopathy is seen. Reproductive: The bladder is mildly distended and grossly unremarkable. The patient is status post hysterectomy. No suspicious adnexal masses are seen. Other: Soft tissue edema is seen tracking about  the left lower leg and dorsum of the left foot. Musculoskeletal: No acute osseous abnormalities are identified. The visualized musculature is unremarkable in appearance. IMPRESSION: VASCULAR 1. Occlusion at the left superficial femoral artery, which contains multiple stents, and at the left superficial femoral artery bypass graft, and also of the left profunda femoris artery, and a small anterior collateral vessel. The stent graft, native superficial femoral artery and popliteal artery remain fully occluded along their entire length. However, there is reconstitution of 2 of the 3 branches of the popliteal artery to the level of the distal left leg, likely from small collateral vessels. 2. Mild narrowing at the proximal aspect of the right superficial femoral artery, and minimal mural thrombus along the right superficial femoral artery stent. Patent three-vessel runoff noted to the level of the right ankle and foot. NON-VASCULAR 1. Soft tissue edema tracking about the left lower leg and dorsum of the left foot. 2. Large hiatal hernia, containing most of the stomach, with surrounding atelectasis. 3. Mild bilateral renal atrophy and scarring. Small right renal cyst. These results were called by telephone at the time of interpretation on 01/12/2018 at 2:34 am to Dr. Lurline Hare, who verbally acknowledged these results. Electronically Signed   By: Garald Balding M.D.   On: 01/12/2018 02:49   Dg Chest Port 1 View  Result Date: 01/12/2018 CLINICAL DATA:  Leg pain and shortness of breath EXAM: PORTABLE CHEST 1 VIEW COMPARISON:  07/07/2017 FINDINGS: Large hiatal hernia is unchanged. Moderate cardiomegaly with mild interstitial pulmonary edema. No focal airspace consolidation. No pleural effusion or pneumothorax. IMPRESSION: 1. Cardiomegaly and mild interstitial pulmonary edema. 2. Unchanged large hiatal hernia. Electronically Signed   By: Ulyses Jarred M.D.   On: 01/12/2018 02:18     ____________________________________________   PROCEDURES  Procedure(s) performed: None  Procedures  Critical Care performed: Yes, see critical care note(s)   CRITICAL CARE Performed by: Paulette Blanch   Total critical care time: 60 minutes  Critical care time was exclusive of separately billable procedures and treating other patients.  Critical care was necessary to treat or prevent imminent or life-threatening deterioration.  Critical care was time spent personally by me on the  following activities: development of treatment plan with patient and/or surrogate as well as nursing, discussions with consultants, evaluation of patient's response to treatment, examination of patient, obtaining history from patient or surrogate, ordering and performing treatments and interventions, ordering and review of laboratory studies, ordering and review of radiographic studies, pulse oximetry and re-evaluation of patient's condition.  ____________________________________________   INITIAL IMPRESSION / ASSESSMENT AND PLAN / ED COURSE  As part of my medical decision making, I reviewed the following data within the [place] notes reviewed and incorporated, Labs reviewed, EKG interpreted, Old chart reviewed, Radiograph reviewed, Discussed with admitting physician and Notes from prior ED visits   85 year old female who presents with left leg pain from hip all the way down to her toes.  Differential diagnosis includes but is not limited to sciatica, musculoskeletal pain, DVT, claudication, ischemic, metabolic, infectious etiologies, etc.  Clinically patient's left foot is pale and cold compared to the right.  Unable to palpate pulses.  Will use Doppler, send patient for urgent CTA to evaluate ischemic limb.  Initiate IV fluid resuscitation, 50 mcg IV fentanyl for pain and reassess.  Clinical Course as of Jan 12 433  Charna Archer  0104 Patient in CT scan.  Unable to  Doppler pulses.  Will initiate heparin bolus and drip.   [JS]  5465 Spoke with radiologist Dr. Radene Knee and updated patient of CT findings.  She is being followed by [facility] vascular surgery.  Will call [facility] for transfer.  Pain significantly better after IV fentanyl.  Heparin infusing.   [JS]  [id number] Accepted at [facility].  Transport called.  Patient updated.   [JS]    Clinical Course User Index [JS] Paulette Blanch, MD     ____________________________________________   FINAL CLINICAL IMPRESSION(S) / ED DIAGNOSES  Final diagnoses:  Left leg pain  PAD (peripheral artery disease) ([place])  Ischemic leg     ED Discharge Orders    None       Note:  This document was prepared using Dragon voice recognition software and may include unintentional dictation errors.    Paulette Blanch, MD 01/12/18 [phone]

## 2018-01-12 NOTE — ED Notes (Signed)
Pt is going to CT.

## 2018-01-14 ENCOUNTER — Inpatient Hospital Stay
Admission: EM | Admit: 2018-01-14 | Discharge: 2018-01-15 | DRG: 300 | Disposition: A | Discharge status: Other Acute Inpatient Hospital | Payer: Medicare Other | Attending: Internal Medicine | Admitting: Internal Medicine

## 2018-01-14 ENCOUNTER — Other Ambulatory Visit: Payer: Self-pay

## 2018-01-14 DIAGNOSIS — Z66 Do not resuscitate: Secondary | ICD-10-CM | POA: Diagnosis present

## 2018-01-14 DIAGNOSIS — Z87891 Personal history of nicotine dependence: Secondary | ICD-10-CM

## 2018-01-14 DIAGNOSIS — J449 Chronic obstructive pulmonary disease, unspecified: Secondary | ICD-10-CM | POA: Diagnosis present

## 2018-01-14 DIAGNOSIS — Z86718 Personal history of other venous thrombosis and embolism: Secondary | ICD-10-CM | POA: Diagnosis not present

## 2018-01-14 DIAGNOSIS — I120 Hypertensive chronic kidney disease with stage 5 chronic kidney disease or end stage renal disease: Secondary | ICD-10-CM | POA: Diagnosis present

## 2018-01-14 DIAGNOSIS — Z881 Allergy status to other antibiotic agents status: Secondary | ICD-10-CM | POA: Diagnosis not present

## 2018-01-14 DIAGNOSIS — Z79899 Other long term (current) drug therapy: Secondary | ICD-10-CM

## 2018-01-14 DIAGNOSIS — E785 Hyperlipidemia, unspecified: Secondary | ICD-10-CM | POA: Diagnosis present

## 2018-01-14 DIAGNOSIS — Z7982 Long term (current) use of aspirin: Secondary | ICD-10-CM

## 2018-01-14 DIAGNOSIS — K219 Gastro-esophageal reflux disease without esophagitis: Secondary | ICD-10-CM | POA: Diagnosis present

## 2018-01-14 DIAGNOSIS — Z885 Allergy status to narcotic agent status: Secondary | ICD-10-CM

## 2018-01-14 DIAGNOSIS — I739 Peripheral vascular disease, unspecified: Principal | ICD-10-CM | POA: Diagnosis present

## 2018-01-14 DIAGNOSIS — I7090 Unspecified atherosclerosis: Secondary | ICD-10-CM | POA: Diagnosis present

## 2018-01-14 DIAGNOSIS — J9611 Chronic respiratory failure with hypoxia: Secondary | ICD-10-CM | POA: Diagnosis present

## 2018-01-14 DIAGNOSIS — Z7951 Long term (current) use of inhaled steroids: Secondary | ICD-10-CM

## 2018-01-14 DIAGNOSIS — N185 Chronic kidney disease, stage 5: Secondary | ICD-10-CM | POA: Diagnosis present

## 2018-01-14 DIAGNOSIS — Z888 Allergy status to other drugs, medicaments and biological substances status: Secondary | ICD-10-CM

## 2018-01-14 DIAGNOSIS — Z7901 Long term (current) use of anticoagulants: Secondary | ICD-10-CM | POA: Diagnosis not present

## 2018-01-14 DIAGNOSIS — M25562 Pain in left knee: Secondary | ICD-10-CM

## 2018-01-14 LAB — COMPREHENSIVE METABOLIC PANEL
ALT: 10 U/L (ref 0–44)
AST: 20 U/L (ref 15–41)
Albumin: 3.4 g/dL — ABNORMAL LOW (ref 3.5–5.0)
Alkaline Phosphatase: 77 U/L (ref 38–126)
Anion gap: 7 (ref 5–15)
BUN: 23 mg/dL (ref 8–23)
CO2: 25 mmol/L (ref 22–32)
Calcium: 8.9 mg/dL (ref 8.9–10.3)
Chloride: 104 mmol/L (ref 98–111)
Creatinine, Ser: 0.74 mg/dL (ref 0.44–1.00)
GFR calc Af Amer: >60 mL/min (ref >60)
GFR calc non Af Amer: >60 mL/min (ref >60)
Glucose, Bld: 99 mg/dL (ref 70–99)
Potassium: 4.2 mmol/L (ref 3.5–5.1)
Sodium: 136 mmol/L (ref 135–145)
Total Bilirubin: 0.6 mg/dL (ref 0.3–1.2)
Total Protein: 6.3 g/dL — ABNORMAL LOW (ref 6.5–8.1)

## 2018-01-14 LAB — CG4 I-STAT (LACTIC ACID): Lactic Acid, Venous: 0.94 mmol/L (ref 0.5–1.9)

## 2018-01-14 LAB — CBC
HCT: 37.5 % (ref 36.0–46.0)
Hemoglobin: 11.6 g/dL — ABNORMAL LOW (ref 12.0–15.0)
MCH: 28.4 pg (ref 26.0–34.0)
MCHC: 30.9 g/dL (ref 30.0–36.0)
MCV: 91.9 fL (ref 80.0–100.0)
Platelets: 201 10*3/uL (ref 150–400)
RBC: 4.08 MIL/uL (ref 3.87–5.11)
RDW: 16.6 % — ABNORMAL HIGH (ref 11.5–15.5)
WBC: 8.5 10*3/uL (ref 4.0–10.5)
nRBC: 0 % (ref 0.0–0.2)

## 2018-01-14 LAB — PROTIME-INR
INR: 1
PROTHROMBIN TIME: 13.1 s (ref 11.4–15.2)

## 2018-01-14 LAB — APTT
APTT: 30 s (ref 24–36)
aPTT: 49 seconds — ABNORMAL HIGH (ref 24–36)

## 2018-01-14 LAB — HEMOGLOBIN A1C
Hgb A1c MFr Bld: 5.8 % — ABNORMAL HIGH (ref 4.8–5.6)
MEAN PLASMA GLUCOSE: 119.76 mg/dL

## 2018-01-14 LAB — MRSA PCR SCREENING: MRSA by PCR: NEGATIVE

## 2018-01-14 MED ORDER — ACETAMINOPHEN 325 MG PO TABS: 650.0000 mg | ORAL_TABLET | Freq: Four times a day (QID) | ORAL | Status: DC | PRN

## 2018-01-14 MED ORDER — HEPARIN (PORCINE) 25000 UT/250ML-% IV SOLN
800.0000 [IU]/h | INTRAVENOUS | Status: DC
  Administered 2018-01-14: 650 [IU]/h via INTRAVENOUS
  Filled 2018-01-14: qty 250

## 2018-01-14 MED ORDER — ONDANSETRON HCL 4 MG PO TABS: 4.0000 mg | ORAL_TABLET | Freq: Four times a day (QID) | ORAL | 0 refills | Status: DC | PRN

## 2018-01-14 MED ORDER — SENNOSIDES-DOCUSATE SODIUM 8.6-50 MG PO TABS
2.00 | ORAL_TABLET | ORAL | Status: DC
Start: 2018-01-13 — End: 2018-01-14

## 2018-01-14 MED ORDER — ALPRAZOLAM 0.25 MG PO TABS: 0.2500 mg | ORAL_TABLET | Freq: Two times a day (BID) | ORAL | 0 refills | Status: DC | PRN

## 2018-01-14 MED ORDER — GABAPENTIN 100 MG PO CAPS
100.00 | ORAL_CAPSULE | ORAL | Status: DC
Start: 2018-01-12 — End: 2018-01-14

## 2018-01-14 MED ORDER — FENTANYL CITRATE (PF) 100 MCG/2ML IJ SOLN
50.0000 ug | Freq: Once | INTRAMUSCULAR | Status: AC
  Administered 2018-01-14: 50 ug via INTRAVENOUS
  Filled 2018-01-14: qty 2

## 2018-01-14 MED ORDER — ONDANSETRON HCL 4 MG/2ML IJ SOLN
4.0000 mg | Freq: Four times a day (QID) | INTRAMUSCULAR | Status: DC | PRN
  Administered 2018-01-15: 4 mg via INTRAVENOUS
  Filled 2018-01-14: qty 2

## 2018-01-14 MED ORDER — DILTIAZEM HCL ER COATED BEADS 180 MG PO CP24
180.0000 mg | ORAL_CAPSULE | ORAL | Status: DC
  Filled 2018-01-14: qty 1

## 2018-01-14 MED ORDER — MORPHINE SULFATE (PF) 2 MG/ML IV SOLN
2.0000 mg | INTRAVENOUS | Status: DC | PRN
  Administered 2018-01-14 – 2018-01-15 (×3): 2 mg via INTRAVENOUS
  Filled 2018-01-14 (×3): qty 1

## 2018-01-14 MED ORDER — FENTANYL CITRATE (PF) 50 MCG/ML IJ SOLN
25.00 | INTRAMUSCULAR | Status: DC
Start: ? — End: 2018-01-14

## 2018-01-14 MED ORDER — ASPIRIN 81 MG PO CHEW
81.00 | CHEWABLE_TABLET | ORAL | Status: DC
Start: 2018-01-13 — End: 2018-01-14

## 2018-01-14 MED ORDER — INSULIN ASPART 100 UNIT/ML ~~LOC~~ SOLN: 0.0000 [IU] | Freq: Three times a day (TID) | SUBCUTANEOUS | Status: DC

## 2018-01-14 MED ORDER — ALPRAZOLAM 0.25 MG PO TABS
0.2500 mg | ORAL_TABLET | Freq: Two times a day (BID) | ORAL | Status: DC | PRN
Start: 2018-01-14 — End: 2018-01-15
  Administered 2018-01-14: 0.25 mg via ORAL
  Filled 2018-01-14: qty 1

## 2018-01-14 MED ORDER — MELATONIN 5 MG PO TABS
5.0000 mg | ORAL_TABLET | Freq: Every day | ORAL | Status: DC
  Administered 2018-01-14: 5 mg via ORAL
  Filled 2018-01-14 (×2): qty 1

## 2018-01-14 MED ORDER — ONDANSETRON HCL 4 MG/2ML IJ SOLN
4.0000 mg | Freq: Once | INTRAMUSCULAR | Status: AC
  Administered 2018-01-14: 4 mg via INTRAVENOUS
  Filled 2018-01-14: qty 2

## 2018-01-14 MED ORDER — PRAMIPEXOLE DIHYDROCHLORIDE 0.25 MG PO TABS
0.5000 mg | ORAL_TABLET | Freq: Two times a day (BID) | ORAL | Status: DC
  Administered 2018-01-14 – 2018-01-15 (×2): 0.5 mg via ORAL
  Filled 2018-01-14 (×2): qty 2

## 2018-01-14 MED ORDER — ONDANSETRON HCL 4 MG PO TABS: 4.0000 mg | ORAL_TABLET | Freq: Four times a day (QID) | ORAL | Status: DC | PRN

## 2018-01-14 MED ORDER — OXYCODONE HCL 5 MG PO TABS
5.0000 mg | ORAL_TABLET | ORAL | Status: DC | PRN
  Administered 2018-01-14 – 2018-01-15 (×2): 5 mg via ORAL
  Filled 2018-01-14 (×2): qty 1

## 2018-01-14 MED ORDER — BUDESONIDE 0.5 MG/2ML IN SUSP: 0.5000 mg | Freq: Two times a day (BID) | RESPIRATORY_TRACT | 12 refills | Status: DC

## 2018-01-14 MED ORDER — ASPIRIN EC 81 MG PO TBEC
81.0000 mg | DELAYED_RELEASE_TABLET | Freq: Every day | ORAL | Status: DC
  Administered 2018-01-14: 81 mg via ORAL
  Filled 2018-01-14: qty 1

## 2018-01-14 MED ORDER — ONDANSETRON HCL 4 MG/2ML IJ SOLN
4.00 | INTRAMUSCULAR | Status: DC
Start: ? — End: 2018-01-14

## 2018-01-14 MED ORDER — MONTELUKAST SODIUM 10 MG PO TABS
10.0000 mg | ORAL_TABLET | ORAL | Status: DC
  Administered 2018-01-15: 10 mg via ORAL
  Filled 2018-01-14: qty 1

## 2018-01-14 MED ORDER — MELATONIN 3 MG PO TABS
3.00 | ORAL_TABLET | ORAL | Status: DC
Start: 2018-01-12 — End: 2018-01-14

## 2018-01-14 MED ORDER — ACETAMINOPHEN 325 MG PO TABS
975.00 | ORAL_TABLET | ORAL | Status: DC
Start: 2018-01-13 — End: 2018-01-14

## 2018-01-14 MED ORDER — ACETAMINOPHEN 650 MG RE SUPP: 650.0000 mg | Freq: Four times a day (QID) | RECTAL | Status: DC | PRN

## 2018-01-14 MED ORDER — TORSEMIDE 20 MG PO TABS
20.00 | ORAL_TABLET | ORAL | Status: DC
Start: 2018-01-13 — End: 2018-01-14

## 2018-01-14 MED ORDER — METOLAZONE 2.5 MG PO TABS
2.50 | ORAL_TABLET | ORAL | Status: DC
Start: 2018-01-16 — End: 2018-01-14

## 2018-01-14 MED ORDER — MAGNESIUM OXIDE 400 (241.3 MG) MG PO TABS
400.0000 mg | ORAL_TABLET | Freq: Every day | ORAL | Status: DC
  Administered 2018-01-14 – 2018-01-15 (×2): 400 mg via ORAL
  Filled 2018-01-14 (×2): qty 1

## 2018-01-14 MED ORDER — POLYETHYLENE GLYCOL 3350 17 G PO PACK: 17.0000 g | PACK | Freq: Every day | ORAL | Status: DC | PRN

## 2018-01-14 MED ORDER — FLUTICASONE PROPIONATE HFA 44 MCG/ACT IN AERO
1.00 | INHALATION_SPRAY | RESPIRATORY_TRACT | Status: DC
Start: 2018-01-12 — End: 2018-01-14

## 2018-01-14 MED ORDER — PANTOPRAZOLE SODIUM 40 MG PO TBEC
40.0000 mg | DELAYED_RELEASE_TABLET | Freq: Every day | ORAL | Status: DC
  Administered 2018-01-14 – 2018-01-15 (×2): 40 mg via ORAL
  Filled 2018-01-14 (×2): qty 1

## 2018-01-14 MED ORDER — BUDESONIDE 0.5 MG/2ML IN SUSP
0.5000 mg | Freq: Two times a day (BID) | RESPIRATORY_TRACT | Status: DC
  Administered 2018-01-15: 0.5 mg via RESPIRATORY_TRACT
  Filled 2018-01-14: qty 2

## 2018-01-14 MED ORDER — SIMVASTATIN 20 MG PO TABS
20.0000 mg | ORAL_TABLET | Freq: Every day | ORAL | Status: DC
  Administered 2018-01-14: 20 mg via ORAL
  Filled 2018-01-14: qty 1

## 2018-01-14 MED ORDER — HEPARIN BOLUS VIA INFUSION
3300.0000 [IU] | Freq: Once | INTRAVENOUS | Status: AC
  Administered 2018-01-14: 3300 [IU] via INTRAVENOUS
  Filled 2018-01-14: qty 3300

## 2018-01-14 MED ORDER — ALBUTEROL SULFATE (2.5 MG/3ML) 0.083% IN NEBU: 2.5000 mg | INHALATION_SOLUTION | RESPIRATORY_TRACT | Status: DC | PRN

## 2018-01-14 NOTE — Consult Note (Signed)
ANTICOAGULATION CONSULT NOTE - Initial Consult  Pharmacy Consult for heparin Indication: Arterial Occlusion  Allergies  Allergen Reactions  . Codeine Diarrhea    Patient can tolerate small amounts of OXYCODONE per patient info sheet  . Hydrocodone Nausea And Vomiting    Patient can tolerate small amounts of OXYCODONE per patient info sheet  . Levofloxacin Other (See Comments)    Muscle cramps  . Tramadol Nausea And Vomiting    Patient Measurements: Height: 4\' 11"  (149.9 cm) Weight: 131 lb 13.4 oz (59.8 kg) IBW/kg (Calculated) : 43.2 Heparin Dosing Weight: 55.7 kg  Vital Signs: Temp: 99 F (37.2 C) (01/04 1243) Temp Source: Oral (01/04 1243) BP: 128/54 (01/04 1243) Pulse Rate: 88 (01/04 1243)  Labs: Recent Labs    01/12/18 0030 01/12/18 0107  HGB 12.2  --   HCT 38.5  --   PLT 243  --   APTT  --  27  LABPROT 12.8 12.6  INR 0.97 0.95  HEPARINUNFRC  --  1.91*  CREATININE 0.93  --   CKTOTAL 58  --   TROPONINI <0.03  --     Estimated Creatinine Clearance: 33.5 mL/min (by C-G formula based on SCr of 0.93 mg/dL).   Medical History: Past Medical History:  Diagnosis Date  . Anemia   . Arthritis   . Asthma   . Bilateral pneumonia may 2017  . Bronchitis   . Cardiomegaly   . Chronic respiratory failure with hypoxia ([place])   . Cochlear implant in place    bilateral, [name], 04/26/2007   . Gallstones 2015  . GERD (gastroesophageal reflux disease)   . H/O blood clots 2014  . History of home oxygen therapy    at night  . Hypertension   . Hypokalemia   . Peripheral vascular disease ([place])    with arterial clots- on xarelto  . Polyneuropathy   . Renal disorder    Chronic Kidney Disease, Stage 5  . Restless leg syndrome     Medications:  Scheduled: Apixaban 5 mg BID  Assessment: 85 yo female admitted for leg pain. PMH of bypass in 2017, presented to ED 2 days ago, found to have occluded graft with no pulses. Patient left AMA, and now returns with  10/10 pain from knee down and cannot walk. Patient took last apixaban dose at 11 am yesterday. She has been taking one tablet daily.  Goal of Therapy:  APTT 66-102 seconds Heparin level 0.3-0.7 units/ml Monitor platelets by anticoagulation protocol: Yes   Plan:  Ordered baseline labs. Ordered 3300 unit bolus, followed by 650 unit continuous infusion. Will order aPTT in 8 hours. Pharmacy will continue to monitor.   Paticia Stack, PharmD Pharmacy Resident  01/14/2018 2:06 PM

## 2018-01-14 NOTE — Progress Notes (Signed)
Pt c/o 8/10 leg pain and 5 mg oxycodone given at 2145. Pt began having 10-10 leg pain at 2320 and 2 mg IV morphine given. Pt still c/o 10/10 pain and is in tears and is extremely anxious. 0.25 Xanax given and pt reassured that the medicine needs time to work. Pt currently lying in bed with eyes closed with no apparent distress noted.

## 2018-01-14 NOTE — H&P (Signed)
[place] [facility] [place] NAME: Natasha [name]    MR#:  [id number]  DATE OF BIRTH:  06-26-1928  DATE OF ADMISSION:  01/14/2018  PRIMARY CARE PHYSICIAN: Rusty Aus, MD   REQUESTING/REFERRING PHYSICIAN: Dr. Kerman Passey  CHIEF COMPLAINT:   Chief Complaint  Patient presents with  . Leg Pain    HISTORY OF PRESENT ILLNESS:  Natasha [name]  is a 85 y.o. female with a known history of hypertension, peripheral artery disease, chronic respiratory failure with hypoxia, recent diagnosis of arterial occlusion of left lower extremity at [facility] presents to the emergency room due to worsening pain and weakness.  Patient was recently at [facility] and discharged home after she was unable to decide regarding below-knee amputation.  Was discharged home.  After going home she got extremely weak and pain worsened yesterday night.  Presented to the ER.  [facility] contacted and they do not have any beds which might take a few days.  Vascular surgery Dr. Concha Pyo was contacted here who is willing to do the surgery but patient has not decided yet.  At this time patient is being admitted for pain control and vascular evaluation.  Heparin drip started.  PAST MEDICAL HISTORY:   Past Medical History:  Diagnosis Date  . Anemia   . Arthritis   . Asthma   . Bilateral pneumonia may 2017  . Bronchitis   . Cardiomegaly   . Chronic respiratory failure with hypoxia ([place])   . Cochlear implant in place    bilateral, [name], 04/26/2007   . Gallstones 2015  . GERD (gastroesophageal reflux disease)   . H/O blood clots 2014  . History of home oxygen therapy    at night  . Hypertension   . Hypokalemia   . Peripheral vascular disease ([place])    with arterial clots- on xarelto  . Polyneuropathy   . Renal disorder    Chronic Kidney Disease, Stage 5  . Restless leg syndrome     PAST SURGICAL HISTORY:   Past Surgical History:  Procedure Laterality  Date  . ABDOMINAL HYSTERECTOMY  1960  . APPENDECTOMY  1946  . BREAST BIOPSY Bilateral    negative  . CHOLECYSTECTOMY  04-24-13  . COCHLEAR IMPLANT  2009  . COLONOSCOPY  2015   Dr. Rayann Heman   . EYE SURGERY  2008,2009   cataract  . KYPHOPLASTY N/A 08/19/2015   Procedure: KYPHOPLASTY;  Surgeon: Hessie Knows, MD;  Location: [facility] ORS;  Service: Orthopedics;  Laterality: N/A;  . PERIPHERAL VASCULAR CATHETERIZATION N/A 02/27/2015   Procedure: Abdominal Aortogram w/Lower Extremity;  Surgeon: Algernon Huxley, MD;  Location: [place] CV LAB;  Service: Cardiovascular;  Laterality: N/A;  . PERIPHERAL VASCULAR CATHETERIZATION  02/27/2015   Procedure: Lower Extremity Intervention;  Surgeon: Algernon Huxley, MD;  Location: [place] CV LAB;  Service: Cardiovascular;;  . PERIPHERAL VASCULAR CATHETERIZATION Left 02/28/2015   Procedure: Lower Extremity Angiography;  Surgeon: Algernon Huxley, MD;  Location: [place] CV LAB;  Service: Cardiovascular;  Laterality: Left;  . PERIPHERAL VASCULAR CATHETERIZATION  02/28/2015   Procedure: Lower Extremity Intervention;  Surgeon: Algernon Huxley, MD;  Location: [place] CV LAB;  Service: Cardiovascular;;  . PERIPHERAL VASCULAR CATHETERIZATION Left 05/22/2015   Procedure: Lower Extremity Angiography;  Surgeon: Algernon Huxley, MD;  Location: [place] CV LAB;  Service: Cardiovascular;  Laterality: Left;  . PERIPHERAL VASCULAR CATHETERIZATION  05/22/2015   Procedure: Lower Extremity Intervention;  Surgeon: Algernon Huxley, MD;  Location: [place] CV LAB;  Service: Cardiovascular;;  . PERIPHERAL VASCULAR CATHETERIZATION Left 05/23/2015   Procedure: Lower Extremity Angiography;  Surgeon: Algernon Huxley, MD;  Location: [place] CV LAB;  Service: Cardiovascular;  Laterality: Left;  . PERIPHERAL VASCULAR CATHETERIZATION  05/23/2015   Procedure: Lower Extremity Intervention;  Surgeon: Algernon Huxley, MD;  Location: [place] CV LAB;  Service: Cardiovascular;;  . stent placement   2006-2014   multiple stent placements in legs    SOCIAL HISTORY:   Social History   Tobacco Use  . Smoking status: Former Smoker    Packs/day: 1.00    Years: 25.00    Pack years: 25.00    Types: Cigarettes  . Smokeless tobacco: Never Used  Substance Use Topics  . Alcohol use: No    FAMILY HISTORY:   Family History  Problem Relation Age of Onset  . Heart disease Mother   . Heart disease Father   . Cancer Sister        lung  . Breast cancer Daughter     DRUG ALLERGIES:   Allergies  Allergen Reactions  . Codeine Diarrhea    Patient can tolerate small amounts of OXYCODONE per patient info sheet  . Hydrocodone Nausea And Vomiting    Patient can tolerate small amounts of OXYCODONE per patient info sheet  . Levofloxacin Other (See Comments)    Muscle cramps  . Tramadol Nausea And Vomiting    REVIEW OF SYSTEMS:   Review of Systems  Constitutional: Positive for malaise/fatigue. Negative for chills, fever and weight loss.  HENT: Negative for hearing loss and nosebleeds.   Eyes: Negative for blurred vision, double vision and pain.  Respiratory: Negative for cough, hemoptysis, sputum production, shortness of breath and wheezing.   Cardiovascular: Negative for chest pain, palpitations, orthopnea and leg swelling.  Gastrointestinal: Negative for abdominal pain, constipation, diarrhea, nausea and vomiting.  Genitourinary: Negative for dysuria and hematuria.  Musculoskeletal: Positive for joint pain. Negative for back pain, falls and myalgias.  Skin: Negative for rash.  Neurological: Negative for dizziness, tremors, sensory change, speech change, focal weakness, seizures and headaches.  Endo/Heme/Allergies: Does not bruise/bleed easily.  Psychiatric/Behavioral: Negative for depression and memory loss. The patient is not nervous/anxious.     MEDICATIONS AT HOME:   Prior to Admission medications   Medication Sig Start Date End Date Taking? Authorizing Provider  apixaban  (ELIQUIS) 5 MG TABS tablet Take 1 tablet (5 mg total) by mouth 2 (two) times daily. 10 mg BID till 07/15/2017 and then 5 mg BID Patient taking differently: Take 5 mg by mouth daily.  07/15/17  Yes Hillary Bow, MD  aspirin EC 81 MG tablet Take 81 mg by mouth at bedtime.    Yes [provider]  budesonide (PULMICORT) 180 MCG/ACT inhaler Inhale 2 puffs into the lungs 2 (two) times daily.   Yes [provider]  calcium carbonate (OSCAL) 1500 (600 Ca) MG TABS tablet Take 600 mg of elemental calcium by mouth 2 (two) times daily with a meal.   Yes [provider]  Cholecalciferol (VITAMIN D-1000 MAX ST) 1000 units tablet Take 1,000 Units by mouth 2 (two) times daily.   Yes [provider]  diltiazem (CARDIZEM CD) 180 MG 24 hr capsule Take 180 mg by mouth every morning.    Yes [provider]  esomeprazole (NEXIUM) 20 MG capsule Take 20 mg by mouth 2 (two) times daily.    Yes [provider]  magnesium oxide (MAG-OX) 400 MG tablet Take 400 mg by mouth daily.   Yes [provider]  Melatonin 5 MG TABS Take 5 mg by mouth at bedtime.    Yes [provider]  montelukast (SINGULAIR) 10 MG tablet Take 10 mg by mouth every morning.    Yes [provider]  potassium chloride SA (K-DUR,KLOR-CON) 20 MEQ tablet Take 1 tablet (20 mEq total) by mouth See admin instructions. Take 2 tablets (40 mEq) by mouth every morning, Take 20 mEq by mouth every afternoon, and Take 2 tablets (40 mEq) by mouth every night at bedtime. Patient taking differently: Take 20-40 mEq by mouth See admin instructions. Take 2 tablets (40 mEq) by mouth every morning, Take 20 mEq by mouth every afternoon, and Take 2 tablets (40 mEq) by mouth every night at bedtime. 03/31/16  Yes Wieting, Richard, MD  pramipexole (MIRAPEX) 0.5 MG tablet Take 0.5 mg by mouth 2 (two) times daily. 06/30/15  Yes [provider]  simvastatin (ZOCOR) 20 MG tablet Take 20 mg by mouth at  bedtime. 07/03/15  Yes [provider]  torsemide (DEMADEX) 20 MG tablet Take 1 tablet (20 mg total) by mouth 2 (two) times daily. 10/31/16  Yes Epifanio Lesches, MD  acetaminophen (TYLENOL) 325 MG tablet Take 650 mg by mouth every 6 (six) hours as needed for mild pain.    [provider]  albuterol (PROVENTIL HFA;VENTOLIN HFA) 108 (90 BASE) MCG/ACT inhaler Inhale 2 puffs into the lungs 2 (two) times daily as needed for wheezing or shortness of breath.     [provider]  albuterol (PROVENTIL) (2.5 MG/3ML) 0.083% nebulizer solution Take 3 mLs (2.5 mg total) by nebulization every 6 (six) hours as needed for wheezing or shortness of breath. Patient not taking: Reported on 10/29/2016 06/11/15   Loletha Grayer, MD  ALPRAZolam Duanne [place]) 0.25 MG tablet Take 1 tablet (0.25 mg total) by mouth 2 (two) times daily. Patient not taking: Reported on 01/12/2018 07/11/17   Hillary Bow, MD  oxyCODONE-acetaminophen (PERCOCET/ROXICET) 5-325 MG tablet Take 1 tablet by mouth every 6 (six) hours as needed for moderate pain or severe pain. Patient not taking: Reported on 01/12/2018 07/11/17   Hillary Bow, MD  sulfamethoxazole-trimethoprim (BACTRIM DS,SEPTRA DS) 800-160 MG tablet Take 1 tablet by mouth 2 (two) times daily. Patient not taking: Reported on 01/12/2018 07/11/17   Hillary Bow, MD  temazepam (RESTORIL) 15 MG capsule Take 1 capsule (15 mg total) by mouth at bedtime as needed for sleep. Patient not taking: Reported on 01/12/2018 07/11/17   Hillary Bow, MD     VITAL SIGNS:  Blood pressure 123/70, pulse 83, temperature 99 F (37.2 C), temperature source Oral, resp. rate 18, height 4\' 11"  (1.499 m), weight 59.8 kg, SpO2 92 %.  PHYSICAL EXAMINATION:  Physical Exam  GENERAL:  85 y.o.-year-old patient lying in the bed with no acute distress.  Decreased hearing EYES: Pupils equal, round, reactive to light and accommodation. No scleral icterus. Extraocular muscles intact.  HEENT: Head  atraumatic, normocephalic. Oropharynx and nasopharynx clear. No oropharyngeal erythema, moist oral mucosa  NECK:  Supple, no jugular venous distention. No thyroid enlargement, no tenderness.  LUNGS: Normal breath sounds bilaterally, no wheezing, rales, rhonchi. No use of accessory muscles of respiration.  CARDIOVASCULAR: S1, S2 normal. No murmurs, rubs, or gallops.  ABDOMEN: Soft, nontender, nondistended. Bowel sounds present. No organomegaly or mass.  EXTREMITIES: No pedal edema,or clubbing.  Absent pulses of dorsalis pedis and posterior tibialis of  left lower extremity with mid leg and down being cold and cyanotic NEUROLOGIC: Cranial nerves II through XII are intact. No focal Motor or sensory deficits appreciated b/l PSYCHIATRIC: The patient is alert and oriented x 3. Good affect.  SKIN: No obvious rash, lesion, or ulcer.   LABORATORY PANEL:   CBC Recent Labs  Lab 01/14/18 1310  WBC 8.5  HGB 11.6*  HCT 37.5  PLT 201   ------------------------------------------------------------------------------------------------------------------  Chemistries  Recent Labs  Lab 01/14/18 1310  NA 136  K 4.2  CL 104  CO2 25  GLUCOSE 99  BUN 23  CREATININE 0.74  CALCIUM 8.9  AST 20  ALT 10  ALKPHOS 77  BILITOT 0.6   ------------------------------------------------------------------------------------------------------------------  Cardiac Enzymes Recent Labs  Lab 01/12/18 0030  TROPONINI <0.03   ------------------------------------------------------------------------------------------------------------------  RADIOLOGY:  No results found.   IMPRESSION AND PLAN:   *Left lower extremity arterial occlusion.  Will need below-knee amputation.  Vascular surgery consulted.  Dr. Concha Pyo.  Will start diet.  Continue heparin drip.  N.p.o. after midnight.  Patient is still trying to decide regarding her amputation being done here or at [facility].  Do contacted. Pain medications as  needed  *Hypertension.  Continue home medications  All the records are reviewed and case discussed with ED provider. Management plans discussed with the patient, family and they are in agreement.  CODE STATUS: Full code  TOTAL TIME TAKING CARE OF THIS PATIENT: 40 minutes.   Leia Alf Deion Forgue M.D on 01/14/2018 at 2:49 PM  Between 7am to 6pm - Pager - [phone]  After 6pm go to [email or web address] - password [email or web address] [facility]  Office  [phone]  CC: Primary care physician; Rusty Aus, MD  Note: This dictation was prepared with Dragon dictation along with smaller phrase technology. Any transcriptional errors that result from this process are unintentional.

## 2018-01-14 NOTE — ED Triage Notes (Signed)
Pt to ED via [facility] from home. Per pt she has had lower left leg pain x3 weeks. Pt has scheduled amputation for limb this upcoming Monday. Per ems faint pedal pulse in left foot. Pt wears 2L oxygen chronically. Pt sats in 80's placed on 4L. Pt takes prescribed oxycodone at home, has taken one today. NAD.

## 2018-01-14 NOTE — Progress Notes (Signed)
Dr Darvin Neighbours at bedside to discuss with pt and family their request. Dr Georg Ruddle that there are no beds at [facility].

## 2018-01-14 NOTE — Progress Notes (Signed)
Advance care planning  Purpose of Encounter LLE arterial occlusion  Parties in Attendance Patient  Patients Decisional capacity Alert and oriented. Able to make medical desicions  Patient wants her husband to be healthcare power of attorney.  No advance directives in place.  Discussed regarding her arterial occlusion and need for BKA.  Patient is trying to decide between [facility] transfer versus getting the procedure done here.  CODE STATUS discussed and patient wants to be DO NOT RESUSCITATE DO NOT INTUBATE  Orders entered  Time spent - 17 minutes

## 2018-01-14 NOTE — Progress Notes (Signed)
Pt verbalizes to RN that she left [facility] because she was "upset over being told that they wanted to cut my leg off". Pt stated to RN that "I want to have the surgery now, because I can't stand the pain anymore".

## 2018-01-14 NOTE — Progress Notes (Addendum)
Pt with husband and family member at bedside. Pt's family asking if pt can be transferred to [facility] because they don't want the surgery on her leg done at [facility]. Educated that per the ER there are no beds in [place]. Family asking if pt can be transferred to ER. Pt and family educated they can leave AMA and drive to [facility] but no transfer will be available. Pt's family member states they are paging the pt's doctor at [facility] and asking him for a bed. Husband and family member at bedside arguing about how to transfer to pt to [place]. Reeducated on current plan of care. Pt's husband states that she doesn't understand. Pt is alert and oriented and able to verbalize plan of care. Pt's husband stating that "They almost killed me here before when they gave me an MRI". Pt and family educated that RN will contact the MD about their request to be transferred.

## 2018-01-14 NOTE — Discharge Summary (Signed)
Natasha [name], is a 85 y.o. female  DOB 21-Dec-1928  MRN [id number].  Admission date:  01/14/2018  Admitting Physician  Hillary Bow, MD  Discharge Date:  01/14/2018   Primary MD  Rusty Aus, MD  Recommendations for primary care physician for things to follow:   Transfer to [facility]   Admission Diagnosis  Left leg pain   Discharge Diagnosis  Left leg pain    Active Problems:   Arterial occlusion      Past Medical History:  Diagnosis Date  . Anemia   . Arthritis   . Asthma   . Bilateral pneumonia may 2017  . Bronchitis   . Cardiomegaly   . Chronic respiratory failure with hypoxia ([place])   . Cochlear implant in place    bilateral, [name], 04/26/2007   . Gallstones 2015  . GERD (gastroesophageal reflux disease)   . H/O blood clots 2014  . History of home oxygen therapy    at night  . Hypertension   . Hypokalemia   . Peripheral vascular disease ([place])    with arterial clots- on xarelto  . Polyneuropathy   . Renal disorder    Chronic Kidney Disease, Stage 5  . Restless leg syndrome     Past Surgical History:  Procedure Laterality Date  . ABDOMINAL HYSTERECTOMY  1960  . APPENDECTOMY  1946  . BREAST BIOPSY Bilateral    negative  . CHOLECYSTECTOMY  04-24-13  . COCHLEAR IMPLANT  2009  . COLONOSCOPY  2015   Dr. Rayann Heman   . EYE SURGERY  2008,2009   cataract  . KYPHOPLASTY N/A 08/19/2015   Procedure: KYPHOPLASTY;  Surgeon: Hessie Knows, MD;  Location: [facility] ORS;  Service: Orthopedics;  Laterality: N/A;  . PERIPHERAL VASCULAR CATHETERIZATION N/A 02/27/2015   Procedure: Abdominal Aortogram w/Lower Extremity;  Surgeon: Algernon Huxley, MD;  Location: [place] [facility];  Service: Cardiovascular;  Laterality: N/A;  . PERIPHERAL VASCULAR CATHETERIZATION  02/27/2015   Procedure:  Lower Extremity Intervention;  Surgeon: Algernon Huxley, MD;  Location: [place] [facility];  Service: Cardiovascular;;  . PERIPHERAL VASCULAR CATHETERIZATION Left 02/28/2015   Procedure: Lower Extremity Angiography;  Surgeon: Algernon Huxley, MD;  Location: [place] [facility];  Service: Cardiovascular;  Laterality: Left;  . PERIPHERAL VASCULAR CATHETERIZATION  02/28/2015   Procedure: Lower Extremity Intervention;  Surgeon: Algernon Huxley, MD;  Location: [place] [facility];  Service: Cardiovascular;;  . PERIPHERAL VASCULAR CATHETERIZATION Left 05/22/2015   Procedure: Lower Extremity Angiography;  Surgeon: Algernon Huxley, MD;  Location: [place] [facility];  Service: Cardiovascular;  Laterality: Left;  . PERIPHERAL VASCULAR CATHETERIZATION  05/22/2015   Procedure: Lower Extremity Intervention;  Surgeon: Algernon Huxley, MD;  Location: [place] [facility];  Service: Cardiovascular;;  . PERIPHERAL VASCULAR CATHETERIZATION Left 05/23/2015   Procedure: Lower Extremity Angiography;  Surgeon: Algernon Huxley, MD;  Location: [place] [facility];  Service: Cardiovascular;  Laterality: Left;  . PERIPHERAL VASCULAR CATHETERIZATION  05/23/2015   Procedure: Lower Extremity Intervention;  Surgeon: Algernon Huxley, MD;  Location: [place] [facility];  Service: Cardiovascular;;  . stent placement  2006-2014   multiple stent placements in legs       History of present illness and  Hospital Course:     Kindly see H&P for history of present illness and admission details, please review complete Labs, Consult reports and Test reports for all details in brief  HPI  from the history and physical done on the day of admission 85 year old female patient with hypertension, peripheral artery disease, chronic respiratory failure with hypoxia, recent diagnosis of arterial occlusion of left extremity at [facility] came to ER because of worsening pain of the left leg and weakness, admitted for the same.  Patient is admitted for left leg  ischemia and she will need will below-knee amputation, patient decided to go to [facility].   Hospital Course  #1. left leg arterial occlusion, patient is on heparin drip,, n.p.o. at this time, patient will go to [facility] for definitive treatment of her left leg ischemia.  Her pain is controlled with oxycodone 5 mg every 4 hours, morphine 2 mg IV every 4 hours. 2.  History of COPD, patient is on Pulmicort nebs, albuterol nebulizers.  No wheezing.     Discharge Condition: Stable  Follow UP      Discharge Instructions  and  Discharge Medications      Allergies as of 01/14/2018      Reactions   Codeine Diarrhea   Patient can tolerate small amounts of OXYCODONE per patient info sheet   Hydrocodone Nausea And Vomiting   Patient can tolerate small amounts of OXYCODONE per patient info sheet   Levofloxacin Other (See Comments)   Muscle cramps   Tramadol Nausea And Vomiting      Medication List    STOP taking these medications   acetaminophen 325 MG tablet Commonly known as:  TYLENOL   albuterol (2.5 MG/3ML) 0.083% nebulizer solution Commonly known as:  PROVENTIL   albuterol 108 (90 Base) MCG/ACT inhaler Commonly known as:  PROVENTIL HFA;VENTOLIN HFA   apixaban 5 MG Tabs tablet Commonly known as:  ELIQUIS   aspirin EC 81 MG tablet   budesonide 180 MCG/ACT inhaler Commonly known as:  PULMICORT Replaced by:  budesonide 0.5 MG/2ML nebulizer solution   calcium carbonate 1500 (600 Ca) MG Tabs tablet Commonly known as:  OSCAL   diltiazem 180 MG 24 hr capsule Commonly known as:  CARDIZEM CD   magnesium oxide 400 MG tablet Commonly known as:  MAG-OX   Melatonin 5 MG Tabs   montelukast 10 MG tablet Commonly known as:  SINGULAIR   NEXIUM 20 MG capsule Generic drug:  esomeprazole   oxyCODONE-acetaminophen 5-325 MG tablet Commonly known as:  PERCOCET/ROXICET   potassium chloride SA 20 MEQ tablet Commonly known as:   K-DUR,KLOR-CON   pramipexole 0.5 MG tablet Commonly known as:  MIRAPEX   simvastatin 20 MG tablet Commonly known as:  ZOCOR   sulfamethoxazole-trimethoprim 800-160 MG tablet Commonly known as:  BACTRIM DS,SEPTRA DS   temazepam 15 MG capsule Commonly known as:  RESTORIL   torsemide 20 MG tablet Commonly known as:  DEMADEX   VITAMIN D-1000 MAX ST 25 MCG (1000 UT) tablet Generic drug:  Cholecalciferol     TAKE these medications   ALPRAZolam 0.25 MG tablet Commonly known as:  XANAX Take 1 tablet (0.25 mg total) by mouth 2 (two) times daily as needed for anxiety. What changed:    when to take this  reasons to take this   budesonide 0.5 MG/2ML nebulizer solution Commonly known as:  PULMICORT Take 2 mLs (0.5 mg total) by nebulization 2 (two) times daily. Replaces:  budesonide 180 MCG/ACT inhaler   ondansetron 4 MG tablet Commonly known as:  ZOFRAN Take 1 tablet (4 mg total) by mouth every 6 (six) hours as needed for nausea.  Continue heparin drip as per [facility]    Diet and Activity recommendation: See Discharge Instructions above   Consults obtained -vascular surgery consult requested Major procedures and Radiology Reports - PLEASE review detailed and final reports for all details, in brief -      Ct Angio Ao+bifem W & Or Wo Contrast  Result Date: 01/12/2018 CLINICAL DATA:  Acute onset of left hip pain, radiating down to the toes. Erythema and swelling about the lower leg and foot. EXAM: CT ANGIOGRAPHY OF ABDOMINAL AORTA WITH ILIOFEMORAL RUNOFF TECHNIQUE: Multidetector CT imaging of the abdomen, pelvis and lower extremities was performed using the standard protocol during bolus administration of intravenous contrast. Multiplanar CT image reconstructions and MIPs were obtained to evaluate the vascular anatomy. CONTRAST:  146mL ISOVUE-370 IOPAMIDOL (ISOVUE-370) INJECTION 76% COMPARISON:  CT of the abdomen and pelvis performed 11/11/2016 FINDINGS: VASCULAR Aorta:  Scattered calcification is seen along the abdominal aorta, with minimal mural thrombus at distal descending thoracic aorta. No significant luminal narrowing is seen. Celiac: The celiac trunk appears patent, with mild calcification at the origin of the celiac trunk. SMA: The superior mesenteric artery remains patent, with minimal proximal narrowing. Renals: The renal arteries appear patent bilaterally, with calcification at the proximal left renal artery. IMA: The inferior mesenteric artery remains patent. RIGHT Lower Extremity Inflow: Scattered calcification is noted along the right common, internal and external iliac arteries. The right external iliac and common femoral arteries appear patent. Postoperative change is noted at the right inguinal region. Outflow: The profunda femoris artery remains patent. There is mild narrowing at the proximal aspect of the superficial femoral artery. Minimal mural thrombus is seen along the right superficial femoral artery stent. The right popliteal artery remains patent. Runoff: Note is made of patent three-vessel runoff to the level of the right ankle and foot. LEFT Lower Extremity Inflow: Scattered calcification is noted along the left common and external iliac arteries. Postoperative change is noted at left inguinal region. Outflow: There is occlusion at the left superficial femoral artery, which contains multiple stents, and at the left superficial femoral artery bypass graft, and also of the left profunda femoris artery, and a small anterior collateral vessel. The stent graft and native superficial femoral artery remain fully occluded along their entire length. Runoff: The popliteal artery remains occluded. However, there is reconstitution of 2 of the 3 vessels to the level of the distal left leg, likely from small collateral vessels. Veins: Visualized venous structures are grossly unremarkable. Review of the MIP images confirms the above findings. NON-VASCULAR Lower chest:  There is a relatively large hiatal hernia, containing most of the stomach, with surrounding atelectasis. Mild left basilar airspace opacity also likely reflects atelectasis. The visualized portions of the mediastinum are unremarkable. Hepatobiliary: The liver is unremarkable in appearance. The patient is status post cholecystectomy, with clips noted at the gallbladder fossa. The common bile duct remains normal in caliber. Pancreas: The pancreas is within normal limits. Spleen: The spleen is unremarkable in appearance. Adrenals/Urinary Tract: The adrenal glands are unremarkable in appearance. Mild bilateral renal atrophy and scarring are noted. A small right renal cyst is noted. There is no evidence of hydronephrosis. No renal or ureteral stones are identified. No perinephric stranding is seen. Stomach/Bowel: The stomach is unremarkable in appearance. The small bowel is within normal limits. The patient is status post appendectomy. The colon is unremarkable in appearance. Lymphatic: No retroperitoneal or pelvic sidewall lymphadenopathy is seen. Reproductive: The bladder is mildly distended and grossly unremarkable. The patient  is status post hysterectomy. No suspicious adnexal masses are seen. Other: Soft tissue edema is seen tracking about the left lower leg and dorsum of the left foot. Musculoskeletal: No acute osseous abnormalities are identified. The visualized musculature is unremarkable in appearance. IMPRESSION: VASCULAR 1. Occlusion at the left superficial femoral artery, which contains multiple stents, and at the left superficial femoral artery bypass graft, and also of the left profunda femoris artery, and a small anterior collateral vessel. The stent graft, native superficial femoral artery and popliteal artery remain fully occluded along their entire length. However, there is reconstitution of 2 of the 3 branches of the popliteal artery to the level of the distal left leg, likely from small collateral  vessels. 2. Mild narrowing at the proximal aspect of the right superficial femoral artery, and minimal mural thrombus along the right superficial femoral artery stent. Patent three-vessel runoff noted to the level of the right ankle and foot. NON-VASCULAR 1. Soft tissue edema tracking about the left lower leg and dorsum of the left foot. 2. Large hiatal hernia, containing most of the stomach, with surrounding atelectasis. 3. Mild bilateral renal atrophy and scarring. Small right renal cyst. These results were called by telephone at the time of interpretation on 01/12/2018 at 2:34 am to Dr. Lurline Hare, who verbally acknowledged these results. Electronically Signed   By: Garald Balding M.D.   On: 01/12/2018 02:49   Dg Chest Port 1 View  Result Date: 01/12/2018 CLINICAL DATA:  Leg pain and shortness of breath EXAM: PORTABLE CHEST 1 VIEW COMPARISON:  07/07/2017 FINDINGS: Large hiatal hernia is unchanged. Moderate cardiomegaly with mild interstitial pulmonary edema. No focal airspace consolidation. No pleural effusion or pneumothorax. IMPRESSION: 1. Cardiomegaly and mild interstitial pulmonary edema. 2. Unchanged large hiatal hernia. Electronically Signed   By: Ulyses Jarred M.D.   On: 01/12/2018 02:18    Micro Results     Recent Results (from the past 240 hour(s))  MRSA PCR Screening     Status: None   Collection Time: 01/14/18  6:46 PM  Result Value Ref Range Status   MRSA by PCR NEGATIVE NEGATIVE Final    Comment:        The GeneXpert MRSA Assay (FDA approved for NASAL specimens only), is one component of a comprehensive MRSA colonization surveillance program. It is not intended to diagnose MRSA infection nor to guide or monitor treatment for MRSA infections. Performed at [facility], [address] [place], [place] [address]        Today   Subjective:   Natasha [name] today stable for discharge to [facility].  Objective:   Blood pressure (!) 117/51, pulse 79, temperature  99 F (37.2 C), temperature source Oral, resp. rate 20, height 4\' 11"  (1.499 m), weight 59.8 kg, SpO2 97 %.   Intake/Output Summary (Last 24 hours) at 01/14/2018 2104 Last data filed at 01/14/2018 1839 Gross per 24 hour  Intake 53.26 ml  Output -  Net 53.26 ml    Exam Awake Alert, Oriented x 3, No new F.N deficits, Normal affect [place].AT,PERRAL Supple Neck,No JVD, No cervical lymphadenopathy appriciated.  Symmetrical Chest wall movement, Good air movement bilaterally, CTAB RRR,No Gallops,Rubs or new Murmurs, No Parasternal Heave +ve B.Sounds, Abd Soft, Non tender, No organomegaly appriciated, No rebound -guarding or rigidity.  CBC w Diff:  Lab Results  Component Value Date   WBC 8.5 01/14/2018   HGB 11.6 (L) 01/14/2018   HGB 9.3 (L) 01/28/2014   HCT 37.5 01/14/2018  HCT 29.2 (L) 01/28/2014   PLT 201 01/14/2018   PLT 255 01/28/2014   LYMPHOPCT 22 01/12/2018   LYMPHOPCT 19.4 01/28/2014   MONOPCT 9 01/12/2018   MONOPCT 10.7 01/28/2014   EOSPCT 1 01/12/2018   EOSPCT 1.3 01/28/2014   BASOPCT 1 01/12/2018   BASOPCT 0.8 01/28/2014    CMP:  Lab Results  Component Value Date   NA 136 01/14/2018   NA 140 01/28/2014   K 4.2 01/14/2018   K 3.3 (L) 01/28/2014   CL 104 01/14/2018   CL 106 01/28/2014   CO2 25 01/14/2018   CO2 25 01/28/2014   BUN 23 01/14/2018   BUN 21 (H) 05/08/2014   CREATININE 0.74 01/14/2018   CREATININE 0.74 05/08/2014   PROT 6.3 (L) 01/14/2018   PROT 4.9 (L) 05/10/2013   ALBUMIN 3.4 (L) 01/14/2018   ALBUMIN 2.0 (L) 05/10/2013   BILITOT 0.6 01/14/2018   BILITOT 0.4 05/10/2013   ALKPHOS 77 01/14/2018   ALKPHOS 63 05/10/2013   AST 20 01/14/2018   AST 67 (H) 05/10/2013   ALT 10 01/14/2018   ALT 12 05/10/2013  .   Total Time in preparing paper work, data evaluation and todays exam - 27 minutes  Epifanio Lesches M.D on 01/14/2018 at 9:04 PM    Note: This dictation was prepared with Dragon dictation along with smaller phrase technology. Any  transcriptional errors that result from this process are unintentional.

## 2018-01-14 NOTE — Progress Notes (Signed)
   01/14/18 1930  Clinical Encounter Type  Visited With Patient  Visit Type Initial (order regarding major life transition)  Referral From Nurse  Recommendations follow up Sunday 01/15/18  Stress Factors  Patient Stress Factors Major life changes   Chaplain responded to order regarding major life transition.  Patient spoke of decision she needs to make tomorrow regarding where she wants to have her surgery.  Patient reported that she just received some medicine and was having a difficult time staying awake.  Patient deferred visit until tomorrow, 01/15/18.

## 2018-01-14 NOTE — ED Provider Notes (Signed)
[facility] Emergency Department Provider Note  Time seen: 1:12 PM  I have reviewed the triage vital signs and the nursing notes.   HISTORY  Chief Complaint Leg Pain    HPI Natasha [name] is a 85 y.o. female with a past medical history of anemia, gastric reflux, hypertension, presents to the emergency department for left leg pain.  According to the patient she had a bypass performed in 2017 in the left lower extremity, patient presented 01/12/2018 to the emergency department here was found to have an occluded graft with no pulses palpated or dopplerable.  Patient was sent to [facility] and told that she would need an amputation of the left lower extremity.  Patient decided to leave the hospital and was to follow-up with vascular surgery.  It is not clear at this time if the patient left AGAINST MEDICAL ADVICE or was discharged.  Patient states since this morning the leg has been hurting currently 10/10 pain from the knee down.  States the leg is weak and she cannot walk on it due to pain and weakness.  Denies any fever.  No chest pain or trouble breathing.   Past Medical History:  Diagnosis Date  . Anemia   . Arthritis   . Asthma   . Bilateral pneumonia may 2017  . Bronchitis   . Cardiomegaly   . Chronic respiratory failure with hypoxia ([place])   . Cochlear implant in place    bilateral, [name], 04/26/2007   . Gallstones 2015  . GERD (gastroesophageal reflux disease)   . H/O blood clots 2014  . History of home oxygen therapy    at night  . Hypertension   . Hypokalemia   . Peripheral vascular disease ([place])    with arterial clots- on xarelto  . Polyneuropathy   . Renal disorder    Chronic Kidney Disease, Stage 5  . Restless leg syndrome     Patient Active Problem List   Diagnosis Date Noted  . Sacral fracture ([place]) 11/15/2016  . HTN (hypertension) 11/15/2016  . GERD (gastroesophageal reflux disease) 11/15/2016  . RLS (restless legs  syndrome) 11/15/2016  . Asthma 11/15/2016  . Back pain 10/29/2016  . CAP (community acquired pneumonia) 03/27/2016  . Cellulitis 03/27/2016  . Hypokalemia 03/27/2016  . Acute respiratory failure ([place]) 03/27/2016  . Sepsis ([place]) 08/24/2015  . HCAP (healthcare-associated pneumonia) 06/27/2015  . Pneumonia 06/07/2015  . Hypotension 03/11/2015  . Lower extremity atheroembolism ([place]) 02/27/2015  . Ischemic leg 02/27/2015  . Calculus of gallbladder with other cholecystitis, without mention of obstruction 04/20/2013    Past Surgical History:  Procedure Laterality Date  . ABDOMINAL HYSTERECTOMY  1960  . APPENDECTOMY  1946  . BREAST BIOPSY Bilateral    negative  . CHOLECYSTECTOMY  04-24-13  . COCHLEAR IMPLANT  2009  . COLONOSCOPY  2015   Dr. Rayann Heman   . EYE SURGERY  2008,2009   cataract  . KYPHOPLASTY N/A 08/19/2015   Procedure: KYPHOPLASTY;  Surgeon: Hessie Knows, MD;  Location: [facility] ORS;  Service: Orthopedics;  Laterality: N/A;  . PERIPHERAL VASCULAR CATHETERIZATION N/A 02/27/2015   Procedure: Abdominal Aortogram w/Lower Extremity;  Surgeon: Algernon Huxley, MD;  Location: [place] [facility];  Service: Cardiovascular;  Laterality: N/A;  . PERIPHERAL VASCULAR CATHETERIZATION  02/27/2015   Procedure: Lower Extremity Intervention;  Surgeon: Algernon Huxley, MD;  Location: [place] [facility];  Service: Cardiovascular;;  . PERIPHERAL VASCULAR CATHETERIZATION Left 02/28/2015   Procedure: Lower Extremity  Angiography;  Surgeon: Algernon Huxley, MD;  Location: [place] [facility];  Service: Cardiovascular;  Laterality: Left;  . PERIPHERAL VASCULAR CATHETERIZATION  02/28/2015   Procedure: Lower Extremity Intervention;  Surgeon: Algernon Huxley, MD;  Location: [place] [facility];  Service: Cardiovascular;;  . PERIPHERAL VASCULAR CATHETERIZATION Left 05/22/2015   Procedure: Lower Extremity Angiography;  Surgeon: Algernon Huxley, MD;  Location: [place] [facility];  Service: Cardiovascular;  Laterality: Left;  .  PERIPHERAL VASCULAR CATHETERIZATION  05/22/2015   Procedure: Lower Extremity Intervention;  Surgeon: Algernon Huxley, MD;  Location: [place] [facility];  Service: Cardiovascular;;  . PERIPHERAL VASCULAR CATHETERIZATION Left 05/23/2015   Procedure: Lower Extremity Angiography;  Surgeon: Algernon Huxley, MD;  Location: [place] [facility];  Service: Cardiovascular;  Laterality: Left;  . PERIPHERAL VASCULAR CATHETERIZATION  05/23/2015   Procedure: Lower Extremity Intervention;  Surgeon: Algernon Huxley, MD;  Location: [place] [facility];  Service: Cardiovascular;;  . stent placement  2006-2014   multiple stent placements in legs    Prior to Admission medications   Medication Sig Start Date End Date Taking? Authorizing Provider  acetaminophen (TYLENOL) 325 MG tablet Take 650 mg by mouth every 6 (six) hours as needed for mild pain.    [provider]  albuterol (PROVENTIL HFA;VENTOLIN HFA) 108 (90 BASE) MCG/ACT inhaler Inhale 2 puffs into the lungs 2 (two) times daily as needed for wheezing or shortness of breath.     [provider]  albuterol (PROVENTIL) (2.5 MG/3ML) 0.083% nebulizer solution Take 3 mLs (2.5 mg total) by nebulization every 6 (six) hours as needed for wheezing or shortness of breath. Patient not taking: Reported on 10/29/2016 06/11/15   Loletha Grayer, MD  ALPRAZolam Duanne Moron) 0.25 MG tablet Take 1 tablet (0.25 mg total) by mouth 2 (two) times daily. Patient not taking: Reported on 01/12/2018 07/11/17   Hillary Bow, MD  apixaban (ELIQUIS) 5 MG TABS tablet Take 1 tablet (5 mg total) by mouth 2 (two) times daily. 10 mg BID till 07/15/2017 and then 5 mg BID Patient taking differently: Take 5 mg by mouth daily.  07/15/17   Hillary Bow, MD  aspirin EC 81 MG tablet Take 81 mg by mouth at bedtime.     [provider]  budesonide (PULMICORT) 180 MCG/ACT inhaler Inhale 2 puffs into the lungs 2 (two) times daily.    [provider]  calcium carbonate (OSCAL) 1500 (600  Ca) MG TABS tablet Take 600 mg of elemental calcium by mouth 2 (two) times daily with a meal.    [provider]  Cholecalciferol (VITAMIN D-1000 MAX ST) 1000 units tablet Take 1,000 Units by mouth 2 (two) times daily.    [provider]  diltiazem (CARDIZEM CD) 180 MG 24 hr capsule Take 180 mg by mouth every morning.     [provider]  esomeprazole (NEXIUM) 20 MG capsule Take 20 mg by mouth 2 (two) times daily.     [provider]  magnesium oxide (MAG-OX) 400 MG tablet Take 400 mg by mouth daily.    [provider]  Melatonin 5 MG TABS Take 5 mg by mouth at bedtime.     [provider]  montelukast (SINGULAIR) 10 MG tablet Take 10 mg by mouth every morning.     [provider]  oxyCODONE-acetaminophen (PERCOCET/ROXICET) 5-325 MG tablet Take 1 tablet by mouth every 6 (six) hours as needed for moderate pain or severe pain. Patient not taking:  Reported on 01/12/2018 07/11/17   Hillary Bow, MD  potassium chloride SA (K-DUR,KLOR-CON) 20 MEQ tablet Take 1 tablet (20 mEq total) by mouth See admin instructions. Take 2 tablets (40 mEq) by mouth every morning, Take 20 mEq by mouth every afternoon, and Take 2 tablets (40 mEq) by mouth every night at bedtime. Patient taking differently: Take 20-40 mEq by mouth See admin instructions. Take 2 tablets (40 mEq) by mouth every morning, Take 20 mEq by mouth every afternoon, and Take 2 tablets (40 mEq) by mouth every night at bedtime. 03/31/16   Loletha Grayer, MD  pramipexole (MIRAPEX) 0.5 MG tablet Take 0.5 mg by mouth 2 (two) times daily. 06/30/15   [provider]  simvastatin (ZOCOR) 20 MG tablet Take 20 mg by mouth at bedtime. 07/03/15   [provider]  sulfamethoxazole-trimethoprim (BACTRIM DS,SEPTRA DS) 800-160 MG tablet Take 1 tablet by mouth 2 (two) times daily. Patient not taking: Reported on 01/12/2018 07/11/17   Hillary Bow, MD  temazepam (RESTORIL) 15 MG capsule Take 1  capsule (15 mg total) by mouth at bedtime as needed for sleep. Patient not taking: Reported on 01/12/2018 07/11/17   Hillary Bow, MD  torsemide (DEMADEX) 20 MG tablet Take 1 tablet (20 mg total) by mouth 2 (two) times daily. 10/31/16   Epifanio Lesches, MD    Allergies  Allergen Reactions  . Codeine Diarrhea    Patient can tolerate small amounts of OXYCODONE per patient info sheet  . Hydrocodone Nausea And Vomiting    Patient can tolerate small amounts of OXYCODONE per patient info sheet  . Levofloxacin Other (See Comments)    Muscle cramps  . Tramadol Nausea And Vomiting    Family History  Problem Relation Age of Onset  . Heart disease Mother   . Heart disease Father   . Cancer Sister        lung  . Breast cancer Daughter     Social History Social History   Tobacco Use  . Smoking status: Former Smoker    Packs/day: 1.00    Years: 25.00    Pack years: 25.00    Types: Cigarettes  . Smokeless tobacco: Never Used  Substance Use Topics  . Alcohol use: No  . Drug use: No    Review of Systems Constitutional: Negative for fever. Cardiovascular: Negative for chest pain. Respiratory: Negative for shortness of breath. Gastrointestinal: Negative for abdominal pain Musculoskeletal: Left leg pain and discoloration Skin: Left leg pain and discoloration Neurological: Negative for headache All other ROS negative  ____________________________________________   PHYSICAL EXAM:  VITAL SIGNS: ED Triage Vitals  Enc Vitals Group     BP 01/14/18 1243 (!) 128/54     Pulse Rate 01/14/18 1243 88     Resp 01/14/18 1243 18     Temp 01/14/18 1243 99 F (37.2 C)     Temp Source 01/14/18 1243 Oral     SpO2 01/14/18 1246 92 %     Weight 01/14/18 1243 131 lb 13.4 oz (59.8 kg)     Height 01/14/18 1243 4\' 11"  (1.499 m)     Head Circumference --      Peak Flow --      Pain Score 01/14/18 1243 8     Pain Loc --      Pain Edu? --      Excl. in [place]? --    Constitutional: Alert and  oriented. Well appearing and in no distress. Eyes: Normal exam ENT   Head: Normocephalic  and atraumatic.   Mouth/Throat: Mucous membranes are moist. Cardiovascular: Normal rate, regular rhythm.  Respiratory: Normal respiratory effort without tachypnea nor retractions. Breath sounds are clear  Gastrointestinal: Soft and nontender. No distention.  Musculoskeletal: Patient has purple discoloration left lower extremity from the knee down.  No palpable or dopplerable pulses in the left lower extremity.  Painful to touch across the left lower extremity. Neurologic:  Normal speech and language. No gross focal neurologic deficits  Skin: Skin is discolored, blue/purple discoloration to the left lower extremity no pulses palpable or dopplerable. Psychiatric: Mood and affect are normal.   INITIAL IMPRESSION / ASSESSMENT AND PLAN / ED COURSE  Pertinent labs & imaging results that were available during my care of the patient were reviewed by me and considered in my medical decision making (see chart for details).  Patient presents to the emergency department for left leg pain and discoloration consistent with arterial occlusion.  No pulses palpated were dopplerable.  Patient has known occlusion since 01/12/2018.  Followed up with [facility] but ultimately left the hospital prior to amputation.  I discussed the patient with [facility] as she is a patient of [facility] vascular surgery however they state they are on full diversion at this time and cannot accept the patient, but they will place the patient on a wait list but recommended we talk to our in-house vascular surgeons.  I will discuss with our vascular surgeons to see if there is anything else that can be done for the patient at this time.  I will order heparin infusion as well.  Patient will require admission to either our hospital or [facility].  I discussed the patient with [facility] vascular.  They have accepted the patient on a wait list however they state they  are currently on diversion and cannot tell us when it will be but states there are several on the list in front of her.  I discussed with our in-house vascular surgeon, he would be happy to operate on the patient locally.  I discussed this with the patient however she states she would much prefer to go back to [facility].  Regardless patient needs to be admitted to the hospital for heparin infusion and pain control.  I discussed again with the patient we will admit to our local hospitalist service.  Patient will stay on the [facility] transfer list however if the pain increases patient states she would reconsider having the operation performed locally.  CRITICAL CARE Performed by: Harvest Dark   Total critical care time: 30 minutes  Critical care time was exclusive of separately billable procedures and treating other patients.  Critical care was necessary to treat or prevent imminent or life-threatening deterioration.  Critical care was time spent personally by me on the following activities: development of treatment plan with patient and/or surrogate as well as nursing, discussions with consultants, evaluation of patient's response to treatment, examination of patient, obtaining history from patient or surrogate, ordering and performing treatments and interventions, ordering and review of laboratory studies, ordering and review of radiographic studies, pulse oximetry and re-evaluation of patient's condition.   ____________________________________________   FINAL CLINICAL IMPRESSION(S) / ED DIAGNOSES  Arterial occlusion   Harvest Dark, MD 01/14/18 1439

## 2018-01-15 LAB — BASIC METABOLIC PANEL
Anion gap: 8 (ref 5–15)
BUN: 19 mg/dL (ref 8–23)
CO2: 26 mmol/L (ref 22–32)
Calcium: 9 mg/dL (ref 8.9–10.3)
Chloride: 98 mmol/L (ref 98–111)
Creatinine, Ser: 0.89 mg/dL (ref 0.44–1.00)
GFR calc non Af Amer: 58 mL/min — ABNORMAL LOW (ref >60)
Glucose, Bld: 116 mg/dL — ABNORMAL HIGH (ref 70–99)
Potassium: 4 mmol/L (ref 3.5–5.1)
Sodium: 132 mmol/L — ABNORMAL LOW (ref 135–145)

## 2018-01-15 LAB — CBC
HCT: 38.4 % (ref 36.0–46.0)
Hemoglobin: 11.8 g/dL — ABNORMAL LOW (ref 12.0–15.0)
MCH: 28.5 pg (ref 26.0–34.0)
MCHC: 30.7 g/dL (ref 30.0–36.0)
MCV: 92.8 fL (ref 80.0–100.0)
Platelets: 191 10*3/uL (ref 150–400)
RBC: 4.14 MIL/uL (ref 3.87–5.11)
RDW: 16.3 % — ABNORMAL HIGH (ref 11.5–15.5)
WBC: 14.6 10*3/uL — ABNORMAL HIGH (ref 4.0–10.5)
nRBC: 0 % (ref 0.0–0.2)

## 2018-01-15 LAB — APTT: aPTT: 53 seconds — ABNORMAL HIGH (ref 24–36)

## 2018-01-15 LAB — HEPARIN LEVEL (UNFRACTIONATED): Heparin Unfractionated: 1.25 IU/mL — ABNORMAL HIGH (ref 0.30–0.70)

## 2018-01-15 NOTE — Progress Notes (Signed)
Spoke with [facility]. Stated they wouldn't have a truck until 9am and would attempt to be at [facility] around 10am.

## 2018-01-15 NOTE — Consult Note (Signed)
ANTICOAGULATION CONSULT NOTE - Initial Consult  Pharmacy Consult for heparin Indication: Arterial Occlusion  Allergies  Allergen Reactions  . Codeine Diarrhea    Patient can tolerate small amounts of OXYCODONE per patient info sheet  . Hydrocodone Nausea And Vomiting    Patient can tolerate small amounts of OXYCODONE per patient info sheet  . Levofloxacin Other (See Comments)    Muscle cramps  . Tramadol Nausea And Vomiting    Patient Measurements: Height: 4\' 11"  (149.9 cm) Weight: 131 lb 13.4 oz (59.8 kg) IBW/kg (Calculated) : 43.2 Heparin Dosing Weight: 55.7 kg  Vital Signs: Temp: 97.9 F (36.6 C) (01/04 2341) Temp Source: Oral (01/04 2341) BP: 146/62 (01/04 2341) Pulse Rate: 98 (01/04 2341)  Labs: Recent Labs    01/12/18 0030 01/12/18 0107 01/14/18 1310 01/14/18 1332 01/14/18 2315  HGB 12.2  --  11.6*  --   --   HCT 38.5  --  37.5  --   --   PLT 243  --  201  --   --   APTT  --  27  --  30 49*  LABPROT 12.8 12.6  --  13.1  --   INR 0.97 0.95  --  1.00  --   HEPARINUNFRC  --  1.91*  --   --   --   CREATININE 0.93  --  0.74  --   --   CKTOTAL 58  --   --   --   --   TROPONINI <0.03  --   --   --   --     Estimated Creatinine Clearance: 38.9 mL/min (by C-G formula based on SCr of 0.74 mg/dL).   Medical History: Past Medical History:  Diagnosis Date  . Anemia   . Arthritis   . Asthma   . Bilateral pneumonia may 2017  . Bronchitis   . Cardiomegaly   . Chronic respiratory failure with hypoxia ([place])   . Cochlear implant in place    bilateral, [name], 04/26/2007   . Gallstones 2015  . GERD (gastroesophageal reflux disease)   . H/O blood clots 2014  . History of home oxygen therapy    at night  . Hypertension   . Hypokalemia   . Peripheral vascular disease ([place])    with arterial clots- on xarelto  . Polyneuropathy   . Renal disorder    Chronic Kidney Disease, Stage 5  . Restless leg syndrome     Medications:  Scheduled: Apixaban 5 mg  BID  Assessment: 85 yo female admitted for leg pain. PMH of bypass in 2017, presented to ED 2 days ago, found to have occluded graft with no pulses. Patient left AMA, and now returns with 10/10 pain from knee down and cannot walk. Patient took last apixaban dose at 11 am yesterday. She has been taking one tablet daily.  Goal of Therapy:  APTT 66-102 seconds Heparin level 0.3-0.7 units/ml Monitor platelets by anticoagulation protocol: Yes   Plan:  01/04 @ 2300 aPTT 49 seconds, subtherapeutic. Will increase rate to 800 units/hr and will recheck aPTT/HL @ 0800. CBC w/ am labs.  Tobie Lords, PharmD, BCPS Clinical Pharmacist 01/15/2018

## 2018-01-15 NOTE — Progress Notes (Signed)
Pt discharged via [facility] transport to [place]. Notified pt's husband, Evette Doffing, via telephone.

## 2018-01-15 NOTE — Discharge Summary (Signed)
[facility] NAME: Natasha [name]    MR#:  [id number]  DATE OF BIRTH:  08-13-1928  DATE OF ADMISSION:  01/14/2018 ADMITTING PHYSICIAN: Hillary Bow, MD  DATE OF DISCHARGE: 01/15/2018  PRIMARY CARE PHYSICIAN: Rusty Aus, MD    ADMISSION DIAGNOSIS:  Left leg pain  DISCHARGE DIAGNOSIS:  Active Problems:   Arterial occlusion   SECONDARY DIAGNOSIS:   Past Medical History:  Diagnosis Date  . Anemia   . Arthritis   . Asthma   . Bilateral pneumonia may 2017  . Bronchitis   . Cardiomegaly   . Chronic respiratory failure with hypoxia ([place])   . Cochlear implant in place    bilateral, [name], 04/26/2007   . Gallstones 2015  . GERD (gastroesophageal reflux disease)   . H/O blood clots 2014  . History of home oxygen therapy    at night  . Hypertension   . Hypokalemia   . Peripheral vascular disease ([place])    with arterial clots- on xarelto  . Polyneuropathy   . Renal disorder    Chronic Kidney Disease, Stage 5  . Restless leg syndrome     HOSPITAL COURSE:  85 year old female with a history of PAD, chronic hypoxic respiratory failure on 2 to 3 L of oxygen and recent diagnosis of left lower extremity occlusion who presented to the ER due to worsening pain of her left lower extremity.   1.  Left lower extremity arterial occlusion: Plan to transfer patient to [facility] as per her request.  She is currently on heparin drip.  She is n.p.o. Continue statin 2.  hyperLipidemia: Continue simvastatin  3.  Essential hypertension: Continue diltiazem  4.  Chronic hypoxic respiratory failure: This is stable  5.  Cochlear implant in place left ear  6.  Chronic kidney disease stage V: Creatinine stable  DISCHARGE CONDITIONS AND DIET:   Stable for discharge  CONSULTS OBTAINED:    DRUG ALLERGIES:   Allergies  Allergen Reactions  . Codeine Diarrhea    Patient can tolerate small amounts of OXYCODONE per patient info  sheet  . Hydrocodone Nausea And Vomiting    Patient can tolerate small amounts of OXYCODONE per patient info sheet  . Levofloxacin Other (See Comments)    Muscle cramps  . Tramadol Nausea And Vomiting    DISCHARGE MEDICATIONS:   Allergies as of 01/15/2018      Reactions   Codeine Diarrhea   Patient can tolerate small amounts of OXYCODONE per patient info sheet   Hydrocodone Nausea And Vomiting   Patient can tolerate small amounts of OXYCODONE per patient info sheet   Levofloxacin Other (See Comments)   Muscle cramps   Tramadol Nausea And Vomiting      Medication List    STOP taking these medications   acetaminophen 325 MG tablet Commonly known as:  TYLENOL   albuterol (2.5 MG/3ML) 0.083% nebulizer solution Commonly known as:  PROVENTIL   albuterol 108 (90 Base) MCG/ACT inhaler Commonly known as:  PROVENTIL HFA;VENTOLIN HFA   apixaban 5 MG Tabs tablet Commonly known as:  ELIQUIS   aspirin EC 81 MG tablet   budesonide 180 MCG/ACT inhaler Commonly known as:  PULMICORT Replaced by:  budesonide 0.5 MG/2ML nebulizer solution   calcium carbonate 1500 (600 Ca) MG Tabs tablet Commonly known as:  OSCAL   diltiazem 180 MG 24 hr capsule Commonly known as:  CARDIZEM CD   magnesium oxide 400 MG tablet Commonly  known as:  MAG-OX   Melatonin 5 MG Tabs   montelukast 10 MG tablet Commonly known as:  SINGULAIR   NEXIUM 20 MG capsule Generic drug:  esomeprazole   oxyCODONE-acetaminophen 5-325 MG tablet Commonly known as:  PERCOCET/ROXICET   potassium chloride SA 20 MEQ tablet Commonly known as:  K-DUR,KLOR-CON   pramipexole 0.5 MG tablet Commonly known as:  MIRAPEX   simvastatin 20 MG tablet Commonly known as:  ZOCOR   sulfamethoxazole-trimethoprim 800-160 MG tablet Commonly known as:  BACTRIM DS,SEPTRA DS   temazepam 15 MG capsule Commonly known as:  RESTORIL   torsemide 20 MG tablet Commonly known as:  DEMADEX   VITAMIN D-1000 MAX ST 25 MCG (1000 UT)  tablet Generic drug:  Cholecalciferol     TAKE these medications   ALPRAZolam 0.25 MG tablet Commonly known as:  XANAX Take 1 tablet (0.25 mg total) by mouth 2 (two) times daily as needed for anxiety. What changed:    when to take this  reasons to take this   budesonide 0.5 MG/2ML nebulizer solution Commonly known as:  PULMICORT Take 2 mLs (0.5 mg total) by nebulization 2 (two) times daily. Replaces:  budesonide 180 MCG/ACT inhaler   ondansetron 4 MG tablet Commonly known as:  ZOFRAN Take 1 tablet (4 mg total) by mouth every 6 (six) hours as needed for nausea.         Today   CHIEF COMPLAINT:  Complaining of left leg pain   VITAL SIGNS:  Blood pressure (!) 107/49, pulse 100, temperature 98.6 F (37 C), temperature source Oral, resp. rate 20, height 4\' 11"  (1.499 m), weight 59.8 kg, SpO2 94 %.   REVIEW OF SYSTEMS:  Review of Systems  Constitutional: Negative.  Negative for chills, fever and malaise/fatigue.  HENT: Negative.  Negative for ear discharge, ear pain, hearing loss, nosebleeds and sore throat.   Eyes: Negative.  Negative for blurred vision and pain.  Respiratory: Negative.  Negative for cough, hemoptysis, shortness of breath and wheezing.   Cardiovascular: Negative.  Negative for chest pain, palpitations and leg swelling.  Gastrointestinal: Negative.  Negative for abdominal pain, blood in stool, diarrhea, nausea and vomiting.  Genitourinary: Negative.  Negative for dysuria.  Musculoskeletal: Negative.  Negative for back pain.       Pain leg  Skin: Negative.   Neurological: Negative for dizziness, tremors, speech change, focal weakness, seizures and headaches.  Endo/Heme/Allergies: Negative.  Does not bruise/bleed easily.  Psychiatric/Behavioral: Negative.  Negative for depression, hallucinations and suicidal ideas.     PHYSICAL EXAMINATION:  GENERAL:  85 y.o.-year-old patient lying in the bed with no acute distress.  NECK:  Supple, no jugular  venous distention. No thyroid enlargement, no tenderness.  LUNGS: Normal breath sounds bilaterally, no wheezing, rales,rhonchi  No use of accessory muscles of respiration.  CARDIOVASCULAR: S1, S2 normal. No murmurs, rubs, or gallops.  ABDOMEN: Soft, non-tender, non-distended. Bowel sounds present. No organomegaly or mass.  EXTREMITIES: No pedal edema, cyanosis, or clubbing.  PSYCHIATRIC: The patient is alert and oriented x 3.  SKIN: No obvious rash, lesion, or ulcer.  Left leg cold to touch, pale DATA REVIEW:   CBC Recent Labs  Lab 01/15/18 0757  WBC 14.6*  HGB 11.8*  HCT 38.4  PLT 191    Chemistries  Recent Labs  Lab 01/14/18 1310 01/15/18 0757  NA 136 132*  K 4.2 4.0  CL 104 98  CO2 25 26  GLUCOSE 99 116*  BUN 23 19  CREATININE 0.74  0.89  CALCIUM 8.9 9.0  AST 20  --   ALT 10  --   ALKPHOS 77  --   BILITOT 0.6  --     Cardiac Enzymes Recent Labs  Lab 01/12/18 0030  TROPONINI <0.03    Microbiology Results  @MICRORSLT48 @  RADIOLOGY:  No results found.    Allergies as of 01/15/2018      Reactions   Codeine Diarrhea   Patient can tolerate small amounts of OXYCODONE per patient info sheet   Hydrocodone Nausea And Vomiting   Patient can tolerate small amounts of OXYCODONE per patient info sheet   Levofloxacin Other (See Comments)   Muscle cramps   Tramadol Nausea And Vomiting      Medication List    STOP taking these medications   acetaminophen 325 MG tablet Commonly known as:  TYLENOL   albuterol (2.5 MG/3ML) 0.083% nebulizer solution Commonly known as:  PROVENTIL   albuterol 108 (90 Base) MCG/ACT inhaler Commonly known as:  PROVENTIL HFA;VENTOLIN HFA   apixaban 5 MG Tabs tablet Commonly known as:  ELIQUIS   aspirin EC 81 MG tablet   budesonide 180 MCG/ACT inhaler Commonly known as:  PULMICORT Replaced by:  budesonide 0.5 MG/2ML nebulizer solution   calcium carbonate 1500 (600 Ca) MG Tabs tablet Commonly known as:  OSCAL   diltiazem  180 MG 24 hr capsule Commonly known as:  CARDIZEM CD   magnesium oxide 400 MG tablet Commonly known as:  MAG-OX   Melatonin 5 MG Tabs   montelukast 10 MG tablet Commonly known as:  SINGULAIR   NEXIUM 20 MG capsule Generic drug:  esomeprazole   oxyCODONE-acetaminophen 5-325 MG tablet Commonly known as:  PERCOCET/ROXICET   potassium chloride SA 20 MEQ tablet Commonly known as:  K-DUR,KLOR-CON   pramipexole 0.5 MG tablet Commonly known as:  MIRAPEX   simvastatin 20 MG tablet Commonly known as:  ZOCOR   sulfamethoxazole-trimethoprim 800-160 MG tablet Commonly known as:  BACTRIM DS,SEPTRA DS   temazepam 15 MG capsule Commonly known as:  RESTORIL   torsemide 20 MG tablet Commonly known as:  DEMADEX   VITAMIN D-1000 MAX ST 25 MCG (1000 UT) tablet Generic drug:  Cholecalciferol     TAKE these medications   ALPRAZolam 0.25 MG tablet Commonly known as:  XANAX Take 1 tablet (0.25 mg total) by mouth 2 (two) times daily as needed for anxiety. What changed:    when to take this  reasons to take this   budesonide 0.5 MG/2ML nebulizer solution Commonly known as:  PULMICORT Take 2 mLs (0.5 mg total) by nebulization 2 (two) times daily. Replaces:  budesonide 180 MCG/ACT inhaler   ondansetron 4 MG tablet Commonly known as:  ZOFRAN Take 1 tablet (4 mg total) by mouth every 6 (six) hours as needed for nausea.         Management plans discussed with the patient and she is in agreement. Stable for discharge dule transfer  Patient should follow up with [facility]  CODE STATUS:     Code Status Orders  (From admission, onward)         Start     Ordered   01/14/18 1533  Do not attempt resuscitation (DNR)  Continuous    Question Answer Comment  In the event of cardiac or respiratory ARREST Do not call a "code blue"   In the event of cardiac or respiratory ARREST Do not perform Intubation, CPR, defibrillation or ACLS   In the event of cardiac  or respiratory  ARREST Use medication by any route, position, wound care, and other measures to relive pain and suffering. May use oxygen, suction and manual treatment of airway obstruction as needed for comfort.      01/14/18 1532        Code Status History    Date Active Date Inactive Code Status Order ID Comments User Context   01/14/2018 1415 01/14/2018 1532 Full Code [id number]  Hillary Bow, MD ED   07/08/2017 0250 07/11/2017 1625 Full Code [id number]  Harrie Foreman, MD Inpatient   11/16/2016 0038 11/17/2016 1426 Full Code [id number]  Lance Coon, MD Inpatient   10/29/2016 1825 10/31/2016 1923 Full Code [id number]  Demetrios Loll, MD ED   03/27/2016 1540 03/31/2016 1937 Full Code [id number]  Idelle Crouch, MD Inpatient   01/30/2016 1806 02/02/2016 1520 Full Code [id number]  Henreitta Leber, MD Inpatient   08/24/2015 2116 08/25/2015 1024 Full Code [id number]  Gladstone Lighter, MD Inpatient   06/27/2015 1642 06/30/2015 1616 Partial Code [id number]  Bettey Costa, MD Inpatient   06/07/2015 2008 06/11/2015 1926 Full Code [id number]  Fritzi Mandes, MD Inpatient   05/22/2015 1352 05/24/2015 1612 Full Code [id number]  Algernon Huxley, MD Inpatient   03/11/2015 1820 03/16/2015 1415 Full Code [id number]  Fritzi Mandes, MD Inpatient   02/27/2015 1553 03/03/2015 1824 Full Code [id number]  Stegmayer, Janalyn Harder, PA-C Inpatient      TOTAL TIME TAKING CARE OF THIS PATIENT: 38 minutes.    Note: This dictation was prepared with Dragon dictation along with smaller phrase technology. Any transcriptional errors that result from this process are unintentional.  Kristia Jupiter M.D on 01/15/2018 at 9:01 AM  Between 7am to 6pm - Pager - [phone] After 6pm go to [email or web address] - password [email or web address] [facility]  Office  [phone]  CC: Primary care physician; Rusty Aus, MD

## 2018-01-24 ENCOUNTER — Other Ambulatory Visit: Payer: Self-pay

## 2018-01-24 ENCOUNTER — Encounter: Payer: Self-pay | Admitting: Emergency Medicine

## 2018-01-24 ENCOUNTER — Emergency Department: Payer: Medicare Other

## 2018-01-24 ENCOUNTER — Inpatient Hospital Stay
Admission: EM | Admit: 2018-01-24 | Discharge: 2018-01-26 | DRG: 193 | Disposition: A | Discharge status: Skilled Nursing Facility | Payer: Medicare Other | Source: Skilled Nursing Facility | Attending: Specialist | Admitting: Specialist

## 2018-01-24 DIAGNOSIS — F419 Anxiety disorder, unspecified: Secondary | ICD-10-CM | POA: Diagnosis present

## 2018-01-24 DIAGNOSIS — K219 Gastro-esophageal reflux disease without esophagitis: Secondary | ICD-10-CM | POA: Diagnosis present

## 2018-01-24 DIAGNOSIS — Z803 Family history of malignant neoplasm of breast: Secondary | ICD-10-CM

## 2018-01-24 DIAGNOSIS — G629 Polyneuropathy, unspecified: Secondary | ICD-10-CM | POA: Diagnosis present

## 2018-01-24 DIAGNOSIS — I739 Peripheral vascular disease, unspecified: Secondary | ICD-10-CM | POA: Diagnosis present

## 2018-01-24 DIAGNOSIS — Z7951 Long term (current) use of inhaled steroids: Secondary | ICD-10-CM

## 2018-01-24 DIAGNOSIS — Z79899 Other long term (current) drug therapy: Secondary | ICD-10-CM

## 2018-01-24 DIAGNOSIS — Z89612 Acquired absence of left leg above knee: Secondary | ICD-10-CM | POA: Diagnosis not present

## 2018-01-24 DIAGNOSIS — J440 Chronic obstructive pulmonary disease with acute lower respiratory infection: Secondary | ICD-10-CM | POA: Diagnosis present

## 2018-01-24 DIAGNOSIS — Z9049 Acquired absence of other specified parts of digestive tract: Secondary | ICD-10-CM

## 2018-01-24 DIAGNOSIS — Z7901 Long term (current) use of anticoagulants: Secondary | ICD-10-CM

## 2018-01-24 DIAGNOSIS — I1311 Hypertensive heart and chronic kidney disease without heart failure, with stage 5 chronic kidney disease, or end stage renal disease: Secondary | ICD-10-CM | POA: Diagnosis present

## 2018-01-24 DIAGNOSIS — H9190 Unspecified hearing loss, unspecified ear: Secondary | ICD-10-CM | POA: Diagnosis present

## 2018-01-24 DIAGNOSIS — Z888 Allergy status to other drugs, medicaments and biological substances status: Secondary | ICD-10-CM

## 2018-01-24 DIAGNOSIS — Z87891 Personal history of nicotine dependence: Secondary | ICD-10-CM | POA: Diagnosis not present

## 2018-01-24 DIAGNOSIS — J9621 Acute and chronic respiratory failure with hypoxia: Secondary | ICD-10-CM | POA: Diagnosis present

## 2018-01-24 DIAGNOSIS — Z9621 Cochlear implant status: Secondary | ICD-10-CM | POA: Diagnosis present

## 2018-01-24 DIAGNOSIS — Z8249 Family history of ischemic heart disease and other diseases of the circulatory system: Secondary | ICD-10-CM | POA: Diagnosis not present

## 2018-01-24 DIAGNOSIS — G2581 Restless legs syndrome: Secondary | ICD-10-CM | POA: Diagnosis present

## 2018-01-24 DIAGNOSIS — Y95 Nosocomial condition: Secondary | ICD-10-CM | POA: Diagnosis present

## 2018-01-24 DIAGNOSIS — Z95820 Peripheral vascular angioplasty status with implants and grafts: Secondary | ICD-10-CM

## 2018-01-24 DIAGNOSIS — G546 Phantom limb syndrome with pain: Secondary | ICD-10-CM | POA: Diagnosis present

## 2018-01-24 DIAGNOSIS — N185 Chronic kidney disease, stage 5: Secondary | ICD-10-CM | POA: Diagnosis present

## 2018-01-24 DIAGNOSIS — Z66 Do not resuscitate: Secondary | ICD-10-CM | POA: Diagnosis present

## 2018-01-24 DIAGNOSIS — Z9981 Dependence on supplemental oxygen: Secondary | ICD-10-CM | POA: Diagnosis not present

## 2018-01-24 DIAGNOSIS — Z90710 Acquired absence of both cervix and uterus: Secondary | ICD-10-CM

## 2018-01-24 DIAGNOSIS — Z885 Allergy status to narcotic agent status: Secondary | ICD-10-CM | POA: Diagnosis not present

## 2018-01-24 DIAGNOSIS — E785 Hyperlipidemia, unspecified: Secondary | ICD-10-CM | POA: Diagnosis present

## 2018-01-24 DIAGNOSIS — J189 Pneumonia, unspecified organism: Principal | ICD-10-CM | POA: Diagnosis present

## 2018-01-24 LAB — COMPREHENSIVE METABOLIC PANEL
ALT: 13 U/L (ref 0–44)
AST: 22 U/L (ref 15–41)
Albumin: 3.2 g/dL — ABNORMAL LOW (ref 3.5–5.0)
Alkaline Phosphatase: 98 U/L (ref 38–126)
Anion gap: 10 (ref 5–15)
BUN: 24 mg/dL — ABNORMAL HIGH (ref 8–23)
CO2: 33 mmol/L — ABNORMAL HIGH (ref 22–32)
CREATININE: 0.8 mg/dL (ref 0.44–1.00)
Calcium: 10.2 mg/dL (ref 8.9–10.3)
Chloride: 90 mmol/L — ABNORMAL LOW (ref 98–111)
GFR calc Af Amer: >60 mL/min (ref >60)
GFR calc non Af Amer: >60 mL/min (ref >60)
Glucose, Bld: 101 mg/dL — ABNORMAL HIGH (ref 70–99)
Potassium: 4.3 mmol/L (ref 3.5–5.1)
Sodium: 133 mmol/L — ABNORMAL LOW (ref 135–145)
Total Bilirubin: 0.3 mg/dL (ref 0.3–1.2)
Total Protein: 7.1 g/dL (ref 6.5–8.1)

## 2018-01-24 LAB — CBC
HCT: 35.5 % — ABNORMAL LOW (ref 36.0–46.0)
HEMOGLOBIN: 11.1 g/dL — AB (ref 12.0–15.0)
MCH: 28.3 pg (ref 26.0–34.0)
MCHC: 31.3 g/dL (ref 30.0–36.0)
MCV: 90.6 fL (ref 80.0–100.0)
Platelets: 511 10*3/uL — ABNORMAL HIGH (ref 150–400)
RBC: 3.92 MIL/uL (ref 3.87–5.11)
RDW: 16.5 % — ABNORMAL HIGH (ref 11.5–15.5)
WBC: 10.2 10*3/uL (ref 4.0–10.5)
nRBC: 0.4 % — ABNORMAL HIGH (ref 0.0–0.2)

## 2018-01-24 LAB — MRSA PCR SCREENING: MRSA by PCR: NEGATIVE

## 2018-01-24 LAB — BRAIN NATRIURETIC PEPTIDE: B Natriuretic Peptide: 52 pg/mL (ref 0.0–100.0)

## 2018-01-24 LAB — TROPONIN I: Troponin I: <0.03 ng/mL (ref <0.03)

## 2018-01-24 MED ORDER — ACETAMINOPHEN 325 MG PO TABS
650.0000 mg | ORAL_TABLET | Freq: Four times a day (QID) | ORAL | Status: DC | PRN
  Administered 2018-01-24 – 2018-01-26 (×3): 650 mg via ORAL
  Filled 2018-01-24 (×3): qty 2

## 2018-01-24 MED ORDER — VANCOMYCIN HCL IN DEXTROSE 750-5 MG/150ML-% IV SOLN
750.0000 mg | INTRAVENOUS | Status: DC
  Filled 2018-01-24: qty 150

## 2018-01-24 MED ORDER — ACETAMINOPHEN 650 MG RE SUPP: 650.0000 mg | Freq: Four times a day (QID) | RECTAL | Status: DC | PRN

## 2018-01-24 MED ORDER — SODIUM CHLORIDE 0.9 % IV SOLN
2.0000 g | Freq: Once | INTRAVENOUS | Status: AC
  Administered 2018-01-24: 2 g via INTRAVENOUS
  Filled 2018-01-24: qty 2

## 2018-01-24 MED ORDER — APIXABAN 2.5 MG PO TABS
2.5000 mg | ORAL_TABLET | Freq: Two times a day (BID) | ORAL | Status: DC
  Administered 2018-01-24 – 2018-01-26 (×5): 2.5 mg via ORAL
  Filled 2018-01-24 (×6): qty 1

## 2018-01-24 MED ORDER — BUDESONIDE 0.5 MG/2ML IN SUSP
0.5000 mg | Freq: Two times a day (BID) | RESPIRATORY_TRACT | Status: DC
  Administered 2018-01-24 – 2018-01-25 (×4): 0.5 mg via RESPIRATORY_TRACT
  Filled 2018-01-24 (×7): qty 2

## 2018-01-24 MED ORDER — SODIUM CHLORIDE 0.9 % IV SOLN
250.0000 mL | INTRAVENOUS | Status: DC | PRN
  Administered 2018-01-25 – 2018-01-26 (×2): 250 mL via INTRAVENOUS

## 2018-01-24 MED ORDER — VANCOMYCIN HCL 10 G IV SOLR
1250.0000 mg | Freq: Once | INTRAVENOUS | Status: AC
  Administered 2018-01-24: 1250 mg via INTRAVENOUS
  Filled 2018-01-24: qty 1250

## 2018-01-24 MED ORDER — DILTIAZEM HCL ER COATED BEADS 180 MG PO CP24
180.0000 mg | ORAL_CAPSULE | Freq: Every day | ORAL | Status: DC
  Administered 2018-01-25 – 2018-01-26 (×2): 180 mg via ORAL
  Filled 2018-01-24 (×3): qty 1

## 2018-01-24 MED ORDER — VANCOMYCIN HCL IN DEXTROSE 1-5 GM/200ML-% IV SOLN
1000.0000 mg | Freq: Once | INTRAVENOUS | Status: DC
  Filled 2018-01-24: qty 200

## 2018-01-24 MED ORDER — KETOROLAC TROMETHAMINE 15 MG/ML IJ SOLN
15.0000 mg | Freq: Three times a day (TID) | INTRAMUSCULAR | Status: DC | PRN
  Administered 2018-01-24 – 2018-01-25 (×2): 15 mg via INTRAVENOUS
  Filled 2018-01-24 (×3): qty 1

## 2018-01-24 MED ORDER — ALBUTEROL SULFATE (2.5 MG/3ML) 0.083% IN NEBU: 3.0000 mL | INHALATION_SOLUTION | Freq: Three times a day (TID) | RESPIRATORY_TRACT | Status: DC | PRN

## 2018-01-24 MED ORDER — ONDANSETRON HCL 4 MG/2ML IJ SOLN: 4.0000 mg | Freq: Four times a day (QID) | INTRAMUSCULAR | Status: DC | PRN

## 2018-01-24 MED ORDER — IOHEXOL 350 MG/ML SOLN
75.0000 mL | Freq: Once | INTRAVENOUS | Status: AC | PRN
  Administered 2018-01-24: 75 mL via INTRAVENOUS

## 2018-01-24 MED ORDER — ALPRAZOLAM 0.25 MG PO TABS
0.2500 mg | ORAL_TABLET | Freq: Two times a day (BID) | ORAL | Status: DC | PRN
  Administered 2018-01-26: 0.25 mg via ORAL
  Filled 2018-01-24: qty 1

## 2018-01-24 MED ORDER — SODIUM CHLORIDE 0.9% FLUSH
3.0000 mL | Freq: Two times a day (BID) | INTRAVENOUS | Status: DC
  Administered 2018-01-24 – 2018-01-26 (×5): 3 mL via INTRAVENOUS

## 2018-01-24 MED ORDER — CELECOXIB 100 MG PO CAPS
100.0000 mg | ORAL_CAPSULE | Freq: Two times a day (BID) | ORAL | Status: DC
  Administered 2018-01-24 – 2018-01-26 (×5): 100 mg via ORAL
  Filled 2018-01-24 (×8): qty 1

## 2018-01-24 MED ORDER — SENNOSIDES-DOCUSATE SODIUM 8.6-50 MG PO TABS
1.0000 | ORAL_TABLET | Freq: Every evening | ORAL | Status: DC | PRN
  Administered 2018-01-26: 01:00:00 1 via ORAL
  Filled 2018-01-24: qty 1

## 2018-01-24 MED ORDER — SODIUM CHLORIDE 0.9 % IV SOLN
2.0000 g | Freq: Two times a day (BID) | INTRAVENOUS | Status: DC
  Administered 2018-01-24 – 2018-01-26 (×4): 2 g via INTRAVENOUS
  Filled 2018-01-24 (×6): qty 2

## 2018-01-24 MED ORDER — ONDANSETRON HCL 4 MG PO TABS: 4.0000 mg | ORAL_TABLET | Freq: Four times a day (QID) | ORAL | Status: DC | PRN

## 2018-01-24 MED ORDER — KETOROLAC TROMETHAMINE 30 MG/ML IJ SOLN
INTRAMUSCULAR | Status: AC
  Filled 2018-01-24: qty 1

## 2018-01-24 MED ORDER — SODIUM CHLORIDE 0.9% FLUSH
3.0000 mL | INTRAVENOUS | Status: DC | PRN
  Administered 2018-01-24: 3 mL via INTRAVENOUS
  Filled 2018-01-24: qty 3

## 2018-01-24 NOTE — Consult Note (Signed)
[facility] Nurse wound consult note Reason for Consult: Incision line care from recent AKA Wound type:surgical Pressure Injury POA: NA Measurement:0.1cm x 18cm with edges in close approximation, no depth.  Staples intact. Wound bed:See above Drainage (amount, consistency, odor) None Periwound:intact, edematous, mild erythema, no induration or warmth. Dressing procedure/placement/frequency: I will provide Nursing with guidance for care via the Orders using a betadine swabstick for daily care and to redress with a dry dressing. Patient to follow up with Vascular per their previous instructions.  [facility] nursing team will not follow, but will remain available to this patient, the nursing and medical teams.  Please re-consult if needed. Thanks, Maudie Flakes, MSN, RN, [place], Arther Abbott  Pager# [phone]

## 2018-01-24 NOTE — ED Provider Notes (Addendum)
[facility] Emergency Department Provider Note  Time seen: 8:53 AM  I have reviewed the triage vital signs and the nursing notes.   HISTORY  Chief Complaint Post-op Problem    HPI Natasha [name] is a 85 y.o. female with a past medical history of anemia, arthritis, gastric reflux, hypertension, presents to the emergency department for hypoxia.  Patient had a left leg amputation approximately 3 days ago has been her rehab facility ever since.  Patient was complaining of pain to the left leg and right leg today.  Was found to be hypoxic to 70%, with no baseline O2 requirement.  Per EMS no complaints of chest pain, no baseline O2 requirement.  Patient overall appears well, calm and pleasant.  Cannot hear as she has a cochlear implant at baseline and does not have it in currently.  Cannot read as she did not bring her glasses with her.  Awaiting daughter arrival for further history and help.   Past Medical History:  Diagnosis Date  . Anemia   . Arthritis   . Asthma   . Bilateral pneumonia may 2017  . Bronchitis   . Cardiomegaly   . Chronic respiratory failure with hypoxia ([place])   . Cochlear implant in place    bilateral, [name], 04/26/2007   . Gallstones 2015  . GERD (gastroesophageal reflux disease)   . H/O blood clots 2014  . History of home oxygen therapy    at night  . Hypertension   . Hypokalemia   . Peripheral vascular disease ([place])    with arterial clots- on xarelto  . Polyneuropathy   . Renal disorder    Chronic Kidney Disease, Stage 5  . Restless leg syndrome     Patient Active Problem List   Diagnosis Date Noted  . Arterial occlusion 01/14/2018  . Sacral fracture ([place]) 11/15/2016  . HTN (hypertension) 11/15/2016  . GERD (gastroesophageal reflux disease) 11/15/2016  . RLS (restless legs syndrome) 11/15/2016  . Asthma 11/15/2016  . Back pain 10/29/2016  . CAP (community acquired pneumonia) 03/27/2016  . Cellulitis 03/27/2016   . Hypokalemia 03/27/2016  . Acute respiratory failure ([place]) 03/27/2016  . Sepsis ([place]) 08/24/2015  . HCAP (healthcare-associated pneumonia) 06/27/2015  . Pneumonia 06/07/2015  . Hypotension 03/11/2015  . Lower extremity atheroembolism ([place]) 02/27/2015  . Ischemic leg 02/27/2015  . Calculus of gallbladder with other cholecystitis, without mention of obstruction 04/20/2013    Past Surgical History:  Procedure Laterality Date  . ABDOMINAL HYSTERECTOMY  1960  . APPENDECTOMY  1946  . BREAST BIOPSY Bilateral    negative  . CHOLECYSTECTOMY  04-24-13  . COCHLEAR IMPLANT  2009  . COLONOSCOPY  2015   Dr. Rayann Heman   . EYE SURGERY  2008,2009   cataract  . KYPHOPLASTY N/A 08/19/2015   Procedure: KYPHOPLASTY;  Surgeon: Hessie Knows, MD;  Location: [facility] ORS;  Service: Orthopedics;  Laterality: N/A;  . PERIPHERAL VASCULAR CATHETERIZATION N/A 02/27/2015   Procedure: Abdominal Aortogram w/Lower Extremity;  Surgeon: Algernon Huxley, MD;  Location: [facility];  Service: Cardiovascular;  Laterality: N/A;  . PERIPHERAL VASCULAR CATHETERIZATION  02/27/2015   Procedure: Lower Extremity Intervention;  Surgeon: Algernon Huxley, MD;  Location: [place];  Service: Cardiovascular;;  . PERIPHERAL VASCULAR CATHETERIZATION Left 02/28/2015   Procedure: Lower Extremity Angiography;  Surgeon: Algernon Huxley, MD;  Location: [facility];  Service: Cardiovascular;  Laterality: Left;  . PERIPHERAL VASCULAR CATHETERIZATION  02/28/2015  Procedure: Lower Extremity Intervention;  Surgeon: Algernon Huxley, MD;  Location: [place] [facility];  Service: Cardiovascular;;  . PERIPHERAL VASCULAR CATHETERIZATION Left 05/22/2015   Procedure: Lower Extremity Angiography;  Surgeon: Algernon Huxley, MD;  Location: [place] [facility];  Service: Cardiovascular;  Laterality: Left;  . PERIPHERAL VASCULAR CATHETERIZATION  05/22/2015   Procedure: Lower Extremity Intervention;  Surgeon: Algernon Huxley, MD;  Location: [place] [facility];   Service: Cardiovascular;;  . PERIPHERAL VASCULAR CATHETERIZATION Left 05/23/2015   Procedure: Lower Extremity Angiography;  Surgeon: Algernon Huxley, MD;  Location: [place] [facility];  Service: Cardiovascular;  Laterality: Left;  . PERIPHERAL VASCULAR CATHETERIZATION  05/23/2015   Procedure: Lower Extremity Intervention;  Surgeon: Algernon Huxley, MD;  Location: [place] [facility];  Service: Cardiovascular;;  . stent placement  2006-2014   multiple stent placements in legs    Prior to Admission medications   Medication Sig Start Date End Date Taking? Authorizing Provider  ALPRAZolam (XANAX) 0.25 MG tablet Take 1 tablet (0.25 mg total) by mouth 2 (two) times daily as needed for anxiety. 01/14/18   Epifanio Lesches, MD  budesonide (PULMICORT) 0.5 MG/2ML nebulizer solution Take 2 mLs (0.5 mg total) by nebulization 2 (two) times daily. 01/14/18   Epifanio Lesches, MD  ondansetron (ZOFRAN) 4 MG tablet Take 1 tablet (4 mg total) by mouth every 6 (six) hours as needed for nausea. 01/14/18   Epifanio Lesches, MD    Allergies  Allergen Reactions  . Codeine Diarrhea    Patient can tolerate small amounts of OXYCODONE per patient info sheet  . Hydrocodone Nausea And Vomiting    Patient can tolerate small amounts of OXYCODONE per patient info sheet  . Levofloxacin Other (See Comments)    Muscle cramps  . Tramadol Nausea And Vomiting    Family History  Problem Relation Age of Onset  . Heart disease Mother   . Heart disease Father   . Cancer Sister        lung  . Breast cancer Daughter     Social History Social History   Tobacco Use  . Smoking status: Former Smoker    Packs/day: 1.00    Years: 25.00    Pack years: 25.00    Types: Cigarettes  . Smokeless tobacco: Never Used  Substance Use Topics  . Alcohol use: No  . Drug use: No    Review of Systems Unable to obtain an adequate review of systems at this time secondary to extreme difficulty with communication given she is not  wearing her cochlear implant is extremely hard of hearing, and is not wearing her glasses.  ____________________________________________   PHYSICAL EXAM:  Constitutional: Patient is awake and alert, appears well, no distress. Eyes: Normal exam ENT   Head: Normocephalic and atraumatic.   Mouth/Throat: Mucous membranes are moist. Cardiovascular: Normal rate, regular rhythm.  Respiratory: Normal respiratory effort without tachypnea nor retractions. Breath sounds are clear.  No obvious wheeze rales or rhonchi.  Appear to be equal bilaterally. Gastrointestinal: Soft and nontender. No distention. Musculoskeletal: Recent left lower extremity amputation, well-appearing stump, staples intact no dehiscence or signs of infection.  Right lower extremity has 2+ DP pulse, no edema, no calf tenderness. Neurologic:  Normal speech and language. No gross focal neurologic deficits  Skin:  Skin is warm, dry and intact.  Psychiatric: Mood and affect are normal.   ____________________________________________    EKG  EKG viewed and interpreted by myself shows atrial fibrillation 89 bpm  with a narrow QRS, right axis deviation, largely normal intervals, nonspecific ST changes.  ____________________________________________    RADIOLOGY  CT negative for PE but does show left-sided pneumonia.  ____________________________________________   INITIAL IMPRESSION / ASSESSMENT AND PLAN / ED COURSE  Pertinent labs & imaging results that were available during my care of the patient were reviewed by me and considered in my medical decision making (see chart for details).  Patient presents to the emergency department after recent left lower extremity amputation now with hypoxia to 70% on room air, no baseline O2 requirement.  Overall the patient appears well.  Differential this time would include pneumothorax, chronic interstitial disease, mucous plugging, pulmonary embolism.  Patient currently satting 90%  on 4 L of nasal cannula oxygen.  We will check labs, EKG, we will proceed with CT imaging of the chest to help rule out pulmonary embolism.  Patient agreeable to plan of care.  CT most consistent with left-sided pneumonia.  Given the patient's recent hospitalization we will cover for hospital-acquired pneumonia.  We will check blood cultures and admit to the hospital.  Patient agreeable to plan of care.  CRITICAL CARE Performed by: Harvest Dark   Total critical care time: 30 minutes  Critical care time was exclusive of separately billable procedures and treating other patients.  Critical care was necessary to treat or prevent imminent or life-threatening deterioration.  Critical care was time spent personally by me on the following activities: development of treatment plan with patient and/or surrogate as well as nursing, discussions with consultants, evaluation of patient's response to treatment, examination of patient, obtaining history from patient or surrogate, ordering and performing treatments and interventions, ordering and review of laboratory studies, ordering and review of radiographic studies, pulse oximetry and re-evaluation of patient's condition.   ____________________________________________   FINAL CLINICAL IMPRESSION(S) / ED DIAGNOSES  Hypoxia Pneumonia   Harvest Dark, MD 01/24/18 1022    Harvest Dark, MD 01/24/18 1100    Harvest Dark, MD 02/19/18 1901

## 2018-01-24 NOTE — Progress Notes (Addendum)
Pharmacy Antibiotic Note  Natasha [name] is a 85 y.o. female admitted on 01/24/2018 with pneumonia.  Pharmacy has been consulted for Vancomycin and Cefepime dosing.  Plan: Started Cefepime 2 gm q12 hr based on CrCl of 39 ml/min Vancomycin Loaded with 1250mg  once followed by  Vancomycin 750 mg IV Q 24 hrs. Goal AUC 400-550. Expected AUC: 536 SCr used: 0.8   Height: 4\' 11"  (149.9 cm) Weight: 132 lb (59.9 kg) IBW/kg (Calculated) : 43.2  Temp (24hrs), Avg:98.5 F (36.9 C), Min:98.5 F (36.9 C), Max:98.5 F (36.9 C)  Recent Labs  Lab 01/24/18 0909  WBC 10.2  CREATININE 0.80    Estimated Creatinine Clearance: 39 mL/min (by C-G formula based on SCr of 0.8 mg/dL).    Allergies  Allergen Reactions  . Codeine Diarrhea    Patient can tolerate small amounts of OXYCODONE per patient info sheet  . Hydrocodone Nausea And Vomiting    Patient can tolerate small amounts of OXYCODONE per patient info sheet  . Levofloxacin Other (See Comments)    Muscle cramps  . Tramadol Nausea And Vomiting    Antimicrobials this admission: Vancomycin 0114 >>  Cefepime 0114 >>   Dose adjustments this admission: n/a  Microbiology results:  MRSA PCR: pending  Thank you for allowing pharmacy to be a part of this patient's care.  Prudy Feeler, RPh 01/24/2018 1:21 PM

## 2018-01-24 NOTE — ED Triage Notes (Addendum)
Pt arrives via [facility] from [facility]. Per EMS reported  Pt had left leg amputated below the knee last week; pt  c/o right leg pain and L/Leg pain, per EMS upon arrival to facility pt SpO2 was 70% on 5L O2 via [place]. EMS administered 10L resulting in  SpO2 80%.  Per EMS pt is not at baseline; pt does not use O2.  Upon arrival to [facility] ED this RN administered O2 at 5L via [place] resulting in 93% SpO2. Pt is deaf; pt has a cochlear implant. NAD noted at this time. Pt is A/Ox4.

## 2018-01-24 NOTE — ED Notes (Signed)
Family at bedside. 

## 2018-01-24 NOTE — Progress Notes (Signed)
Patient brought to floor with c/o pain 10/10. Notified pharmacy for order review. Awaiting approval.

## 2018-01-24 NOTE — ED Notes (Signed)
Pt c/o L/leg pain 10/10. A message was sent to Dr. Estanislado Pandy with patient's complaint. Dr. Estanislado Pandy stated, he will put in an order.

## 2018-01-24 NOTE — ED Notes (Signed)
Patient given lunch tray at this time.  Will continue to monitor.

## 2018-01-24 NOTE — ED Notes (Signed)
Pt states she is on continous 2L O2 via [place] at home.

## 2018-01-24 NOTE — Progress Notes (Signed)
Advanced care plan.  Purpose of the Encounter: CODE STATUS  Parties in Attendance: Patient  Patient's Decision Capacity: Okay  Subjective/Patient's story: Presented to the emergency room from [facility] rehab facility for low oxygen saturation   Objective/Medical story Patient has pneumonia needs IV antibiotics His oxygen via nasal cannula read And he had amputation of left lower limb below the knee Communicates with the help of cochlear implant   Goals of care determination:  Advance care directives goals of care treatment plan discussed Agreeable with IV antibiotics for the resolution of pneumonia No CPR or intubation or ventilator  CODE STATUS: DNR   Time spent discussing advanced care planning: 16 minutes

## 2018-01-24 NOTE — H&P (Signed)
[facility] [place] NAME: Natasha [name]    MR#:  [id number]  DATE OF BIRTH:  1928/12/27  DATE OF ADMISSION:  01/24/2018  PRIMARY CARE PHYSICIAN: Rusty Aus, MD   REQUESTING/REFERRING PHYSICIAN:   CHIEF COMPLAINT:   Chief Complaint  Patient presents with  . Post-op Problem    HISTORY OF PRESENT ILLNESS: Natasha [name]  is a 85 y.o. female with a known history of cochlear implant bilateral for deafness, chronic respiratory failure with hypoxia, hypertension, hypokalemia, peripheral vascular disease, chronic kidney disease stage V, recent amputation of the left lower extremity below the knee was referred from rehab facility for low oxygen saturation.  Patient was put on oxygen via nasal cannula initially at 10 L by EMS.  Later on the oxygen was weaned to 4 L via nasal cannula emergency room.  Started on IV antibiotics chest x-ray revealed pneumonia.  Patient has cough productive of phlegm.  Patient was started on vancomycin and cefepime antibiotics for healthcare associated pneumonia.  Hospitalist service was consulted for further care.  PAST MEDICAL HISTORY:   Past Medical History:  Diagnosis Date  . Anemia   . Arthritis   . Asthma   . Bilateral pneumonia may 2017  . Bronchitis   . Cardiomegaly   . Chronic respiratory failure with hypoxia ([place])   . Cochlear implant in place    bilateral, [name], 04/26/2007   . Gallstones 2015  . GERD (gastroesophageal reflux disease)   . H/O blood clots 2014  . History of home oxygen therapy    at night  . Hypertension   . Hypokalemia   . Peripheral vascular disease ([place])    with arterial clots- on xarelto  . Polyneuropathy   . Renal disorder    Chronic Kidney Disease, Stage 5  . Restless leg syndrome     PAST SURGICAL HISTORY:  Past Surgical History:  Procedure Laterality Date  . ABDOMINAL HYSTERECTOMY  1960  . APPENDECTOMY  1946  . BREAST BIOPSY Bilateral     negative  . CHOLECYSTECTOMY  04-24-13  . COCHLEAR IMPLANT  2009  . COLONOSCOPY  2015   Dr. Rayann Heman   . EYE SURGERY  2008,2009   cataract  . KYPHOPLASTY N/A 08/19/2015   Procedure: KYPHOPLASTY;  Surgeon: Hessie Knows, MD;  Location: [facility] ORS;  Service: Orthopedics;  Laterality: N/A;  . PERIPHERAL VASCULAR CATHETERIZATION N/A 02/27/2015   Procedure: Abdominal Aortogram w/Lower Extremity;  Surgeon: Algernon Huxley, MD;  Location: [place] [facility];  Service: Cardiovascular;  Laterality: N/A;  . PERIPHERAL VASCULAR CATHETERIZATION  02/27/2015   Procedure: Lower Extremity Intervention;  Surgeon: Algernon Huxley, MD;  Location: [place] [facility];  Service: Cardiovascular;;  . PERIPHERAL VASCULAR CATHETERIZATION Left 02/28/2015   Procedure: Lower Extremity Angiography;  Surgeon: Algernon Huxley, MD;  Location: [place] [facility];  Service: Cardiovascular;  Laterality: Left;  . PERIPHERAL VASCULAR CATHETERIZATION  02/28/2015   Procedure: Lower Extremity Intervention;  Surgeon: Algernon Huxley, MD;  Location: [place] [facility];  Service: Cardiovascular;;  . PERIPHERAL VASCULAR CATHETERIZATION Left 05/22/2015   Procedure: Lower Extremity Angiography;  Surgeon: Algernon Huxley, MD;  Location: [place] [facility];  Service: Cardiovascular;  Laterality: Left;  . PERIPHERAL VASCULAR CATHETERIZATION  05/22/2015   Procedure: Lower Extremity Intervention;  Surgeon: Algernon Huxley, MD;  Location: [place] [facility];  Service: Cardiovascular;;  . PERIPHERAL VASCULAR CATHETERIZATION Left 05/23/2015   Procedure: Lower  Extremity Angiography;  Surgeon: Algernon Huxley, MD;  Location: [place] [facility];  Service: Cardiovascular;  Laterality: Left;  . PERIPHERAL VASCULAR CATHETERIZATION  05/23/2015   Procedure: Lower Extremity Intervention;  Surgeon: Algernon Huxley, MD;  Location: [place] [facility];  Service: Cardiovascular;;  . stent placement  2006-2014   multiple stent placements in legs    SOCIAL HISTORY:  Social History    Tobacco Use  . Smoking status: Former Smoker    Packs/day: 1.00    Years: 25.00    Pack years: 25.00    Types: Cigarettes  . Smokeless tobacco: Never Used  Substance Use Topics  . Alcohol use: No    FAMILY HISTORY:  Family History  Problem Relation Age of Onset  . Heart disease Mother   . Heart disease Father   . Cancer Sister        lung  . Breast cancer Daughter     DRUG ALLERGIES:  Allergies  Allergen Reactions  . Codeine Diarrhea    Patient can tolerate small amounts of OXYCODONE per patient info sheet  . Hydrocodone Nausea And Vomiting    Patient can tolerate small amounts of OXYCODONE per patient info sheet  . Levofloxacin Other (See Comments)    Muscle cramps  . Tramadol Nausea And Vomiting    REVIEW OF SYSTEMS:   CONSTITUTIONAL: No fever, has fatigue and weakness.  EYES: No blurred or double vision.  EARS, NOSE, AND THROAT: No tinnitus or ear pain.  RESPIRATORY: Has cough, shortness of breath, no wheezing or hemoptysis.  CARDIOVASCULAR: No chest pain, orthopnea, edema.  GASTROINTESTINAL: No nausea, vomiting, diarrhea or abdominal pain.  GENITOURINARY: No dysuria, hematuria.  ENDOCRINE: No polyuria, nocturia,  HEMATOLOGY: No anemia, easy bruising or bleeding SKIN: No rash or lesion. MUSCULOSKELETAL: some pain in the amputated stump NEUROLOGIC: No tingling, numbness, weakness.  PSYCHIATRY: No anxiety or depression.   MEDICATIONS AT HOME:  Prior to Admission medications   Medication Sig Start Date End Date Taking? Authorizing Provider  acetaminophen (TYLENOL) 325 MG tablet Take 975 mg by mouth every 8 (eight) hours. 01/20/18 01/30/18 Yes [provider]  albuterol (PROVENTIL HFA;VENTOLIN HFA) 108 (90 Base) MCG/ACT inhaler Inhale 2 puffs into the lungs 3 (three) times daily as needed for shortness of breath or wheezing. 07/26/17  Yes [provider]  apixaban (ELIQUIS) 2.5 MG TABS tablet Take 2.5 mg by mouth every 12 (twelve) hours.  01/20/18  Yes [provider]  celecoxib (CELEBREX) 100 MG capsule Take 100 mg by mouth every 12 (twelve) hours. 01/20/18 02/19/18 Yes [provider]  diltiazem (CARDIZEM CD) 180 MG 24 hr capsule Take 180 mg by mouth daily. 07/31/17  Yes [provider]  ALPRAZolam (XANAX) 0.25 MG tablet Take 1 tablet (0.25 mg total) by mouth 2 (two) times daily as needed for anxiety. 01/14/18   Epifanio Lesches, MD  budesonide (PULMICORT) 0.5 MG/2ML nebulizer solution Take 2 mLs (0.5 mg total) by nebulization 2 (two) times daily. 01/14/18   Epifanio Lesches, MD  ondansetron (ZOFRAN) 4 MG tablet Take 1 tablet (4 mg total) by mouth every 6 (six) hours as needed for nausea. 01/14/18   Epifanio Lesches, MD      PHYSICAL EXAMINATION:   VITAL SIGNS: Blood pressure (!) 105/44, pulse 72, temperature 98.5 F (36.9 C), temperature source Oral, resp. rate 14, height 4\' 11"  (1.499 m), weight 59.9 kg, SpO2 92 %.  GENERAL:  85 y.o.-year-old patient lying in the bed with no acute distress.  EYES: Pupils equal, round, reactive to light and accommodation. No scleral icterus. Extraocular muscles intact.  HEENT: Head atraumatic, normocephalic. Oropharynx and nasopharynx clear.  NECK:  Supple, no jugular venous distention. No thyroid enlargement, no tenderness.  LUNGS:Decreased breath sounds bilaterally, rales heard in left lung. No use of accessory muscles of respiration.  CARDIOVASCULAR: S1, S2 normal. No murmurs, rubs, or gallops.  ABDOMEN: Soft, nontender, nondistended. Bowel sounds present. No organomegaly or mass.  EXTREMITIES: Left BKA Stump dressing noted, no discharge NEUROLOGIC: Cranial nerves II through XII are intact. Muscle strength 5/5 in all extremities. Sensation intact. Gait not checked.  PSYCHIATRIC: The patient is alert and oriented x 3.  SKIN: No obvious rash, lesion, or ulcer.   LABORATORY PANEL:   CBC Recent Labs  Lab 01/24/18 0909  WBC 10.2  HGB 11.1*  HCT 35.5*   PLT 511*  MCV 90.6  MCH 28.3  MCHC 31.3  RDW 16.5*   ------------------------------------------------------------------------------------------------------------------  Chemistries  Recent Labs  Lab 01/24/18 0909  NA 133*  K 4.3  CL 90*  CO2 33*  GLUCOSE 101*  BUN 24*  CREATININE 0.80  CALCIUM 10.2  AST 22  ALT 13  ALKPHOS 98  BILITOT 0.3   ------------------------------------------------------------------------------------------------------------------ estimated creatinine clearance is 39 mL/min (by C-G formula based on SCr of 0.8 mg/dL). ------------------------------------------------------------------------------------------------------------------ No results for input(s): TSH, T4TOTAL, T3FREE, THYROIDAB in the last 72 hours.  Invalid input(s): FREET3   Coagulation profile No results for input(s): INR, PROTIME in the last 168 hours. ------------------------------------------------------------------------------------------------------------------- No results for input(s): DDIMER in the last 72 hours. -------------------------------------------------------------------------------------------------------------------  Cardiac Enzymes Recent Labs  Lab 01/24/18 0909  TROPONINI <0.03   ------------------------------------------------------------------------------------------------------------------ Invalid input(s): POCBNP  ---------------------------------------------------------------------------------------------------------------  Urinalysis    Component Value Date/Time   COLORURINE YELLOW (A) 07/07/2017 2241   APPEARANCEUR HAZY (A) 07/07/2017 2241   APPEARANCEUR Clear 01/26/2014 1720   LABSPEC 1.011 07/07/2017 2241   LABSPEC 1.004 01/26/2014 1720   PHURINE 5.0 07/07/2017 2241   GLUCOSEU NEGATIVE 07/07/2017 2241   GLUCOSEU Negative 01/26/2014 1720   HGBUR NEGATIVE 07/07/2017 2241   BILIRUBINUR NEGATIVE 07/07/2017 2241   BILIRUBINUR Negative 01/26/2014  1720   KETONESUR NEGATIVE 07/07/2017 2241   PROTEINUR NEGATIVE 07/07/2017 2241   NITRITE NEGATIVE 07/07/2017 2241   LEUKOCYTESUR MODERATE (A) 07/07/2017 2241   LEUKOCYTESUR Negative 01/26/2014 1720     RADIOLOGY: Ct Angio Chest Pe W And/or Wo Contrast  Result Date: 01/24/2018 CLINICAL DATA:  Decreased oxygen saturation with hypoxia EXAM: CT ANGIOGRAPHY CHEST WITH CONTRAST TECHNIQUE: Multidetector CT imaging of the chest was performed using the standard protocol during bolus administration of intravenous contrast. Multiplanar CT image reconstructions and MIPs were obtained to evaluate the vascular anatomy. CONTRAST:  31mL OMNIPAQUE IOHEXOL 350 MG/ML SOLN COMPARISON:  Chest CT March 27, 2016; chest radiograph January 12, 2018 FINDINGS: Cardiovascular: There is no appreciable pulmonary embolus. There is no thoracic aortic aneurysm or dissection. Visualized great vessels appear unremarkable. There is aortic atherosclerosis. There is no appreciable pericardial effusion or pericardial thickening. Mediastinum/Nodes: Thyroid appears unremarkable. There are several calcified lymph nodes consistent with prior granulomatous disease. There is a lymph node in the anterior mediastinum which is upper limits normal in size, stable from previous study from 2018. There is a stable right hilar lymph node with short axis diameter of 9 mm, upper normal. No frank adenopathy by size criteria evident. There is a sizable paraesophageal type hernia, primarily to the right of midline, unchanged. Lungs/Pleura: There is underlying centrilobular emphysematous change. There are  focal areas of airspace consolidation in each lower lobe along with atelectatic change. There are small pleural effusions bilaterally. There is atelectatic changes portions of each posterior segment upper lobe. Upper Abdomen: Visualized upper abdominal structures appear unremarkable except for upper abdominal aortic atherosclerosis. Musculoskeletal: There are no  blastic or lytic bone lesions. There are no appreciable chest wall lesions. Review of the MIP images confirms the above findings. IMPRESSION: 1. No demonstrable pulmonary embolus. No thoracic aortic aneurysm or dissection. There is aortic atherosclerosis. 2. Large paraesophageal hernia, primarily located to the right of midline. 3. Areas of patchy airspace consolidation felt to represent pneumonia in each lower lobe with associated atelectatic change. Small pleural effusions noted bilaterally. Areas of atelectatic change in the posterior segment of each upper lobe. Underlying centrilobular emphysematous change. 4. Upper normal in size right hilar and anterior mediastinal lymph nodes. No frank adenopathy by size criteria. Aortic Atherosclerosis (ICD10-I70.0) and Emphysema (ICD10-J43.9). Electronically Signed   By: Lowella Grip III M.D.   On: 01/24/2018 10:16    EKG: Orders placed or performed during the hospital encounter of 01/24/18  . ED EKG  . ED EKG    IMPRESSION AND PLAN: 85 year old female patient with a known history of cochlear implant bilateral for deafness, chronic respiratory failure with hypoxia, hypertension, hypokalemia, peripheral vascular disease, chronic kidney disease stage V, recent amputation of the left lower extremity below the knee was referred from rehab facility for low oxygen saturation.  -Healthcare associated pneumonia Admit patient to medical floor Start patient on IV vancomycin and cefepime antibiotics Follow-up cultures  -Acute on chronic respiratory failure with hypoxia Oxygen via nasal cannula 4 L  -Peripheral vascular disease Status post amputation of the left lower extremity below the knee Daily wound care and stump dressings  -Chronic kidney disease stage V Monitor renal function  -DVT prophylaxis Continue oral Eliquis  -Hypertension Resume diltiazem at home dose   All the records are reviewed and case discussed with ED provider. Management  plans discussed with the patient, family and they are in agreement.  CODE STATUS:DNR Code Status History    Date Active Date Inactive Code Status Order ID Comments User Context   01/14/2018 1532 01/15/2018 1400 DNR [id number]  Hillary Bow, MD Inpatient   01/14/2018 1415 01/14/2018 1532 Full Code [id number]  Hillary Bow, MD ED   07/08/2017 0250 07/11/2017 1625 Full Code [id number]  Harrie Foreman, MD Inpatient   11/16/2016 0038 11/17/2016 1426 Full Code [id number]  Lance Coon, MD Inpatient   10/29/2016 1825 10/31/2016 1923 Full Code [id number]  Demetrios Loll, MD ED   03/27/2016 1540 03/31/2016 1937 Full Code [id number]  Idelle Crouch, MD Inpatient   01/30/2016 1806 02/02/2016 1520 Full Code [id number]  Henreitta Leber, MD Inpatient   08/24/2015 2116 08/25/2015 1024 Full Code [id number]  Gladstone Lighter, MD Inpatient   06/27/2015 1642 06/30/2015 1616 Partial Code [id number]  Bettey Costa, MD Inpatient   06/07/2015 2008 06/11/2015 1926 Full Code [id number]  Fritzi Mandes, MD Inpatient   05/22/2015 1352 05/24/2015 1612 Full Code [id number]  Algernon Huxley, MD Inpatient   03/11/2015 1820 03/16/2015 1415 Full Code [id number]  Fritzi Mandes, MD Inpatient   02/27/2015 1553 03/03/2015 1824 Full Code [id number]  Stegmayer, Janalyn Harder, PA-C Inpatient    Questions for Most Recent Historical Code Status (Order [id number])    Question Answer Comment   In the event of cardiac or respiratory ARREST Do not call a "code blue"    In the event of cardiac  or respiratory ARREST Do not perform Intubation, CPR, defibrillation or ACLS    In the event of cardiac or respiratory ARREST Use medication by any route, position, wound care, and other measures to relive pain and suffering. May use oxygen, suction and manual treatment of airway obstruction as needed for comfort.        TOTAL TIME TAKING CARE OF THIS PATIENT: 52 minutes.    Saundra Shelling M.D on 01/24/2018 at 11:27 AM  Between 7am to 6pm - Pager - [phone]  After 6pm go to  [email or web address] - password [email or web address] [facility]  Office  [phone]  CC: Primary care physician; Rusty Aus, MD

## 2018-01-24 NOTE — ED Notes (Signed)
Per Pyreddy, MD hold schedule medication: Diltiazem 180MG . Due to BP 114/48.

## 2018-01-25 LAB — BASIC METABOLIC PANEL
ANION GAP: 8 (ref 5–15)
BUN: 23 mg/dL (ref 8–23)
CO2: 29 mmol/L (ref 22–32)
Calcium: 9.3 mg/dL (ref 8.9–10.3)
Chloride: 96 mmol/L — ABNORMAL LOW (ref 98–111)
Creatinine, Ser: 0.83 mg/dL (ref 0.44–1.00)
GFR calc Af Amer: >60 mL/min (ref >60)
GFR calc non Af Amer: >60 mL/min (ref >60)
GLUCOSE: 89 mg/dL (ref 70–99)
Potassium: 4.3 mmol/L (ref 3.5–5.1)
Sodium: 133 mmol/L — ABNORMAL LOW (ref 135–145)

## 2018-01-25 LAB — CBC
HCT: 30.8 % — ABNORMAL LOW (ref 36.0–46.0)
Hemoglobin: 9.5 g/dL — ABNORMAL LOW (ref 12.0–15.0)
MCH: 28.3 pg (ref 26.0–34.0)
MCHC: 30.8 g/dL (ref 30.0–36.0)
MCV: 91.7 fL (ref 80.0–100.0)
Platelets: 441 10*3/uL — ABNORMAL HIGH (ref 150–400)
RBC: 3.36 MIL/uL — AB (ref 3.87–5.11)
RDW: 16.4 % — ABNORMAL HIGH (ref 11.5–15.5)
WBC: 7.8 10*3/uL (ref 4.0–10.5)
nRBC: 0.3 % — ABNORMAL HIGH (ref 0.0–0.2)

## 2018-01-25 MED ORDER — POLYETHYLENE GLYCOL 3350 17 G PO PACK
17.0000 g | PACK | Freq: Every day | ORAL | Status: DC | PRN
  Filled 2018-01-25: qty 1

## 2018-01-25 MED ORDER — PRAMIPEXOLE DIHYDROCHLORIDE 0.25 MG PO TABS
0.5000 mg | ORAL_TABLET | Freq: Every day | ORAL | Status: DC
  Administered 2018-01-25 – 2018-01-26 (×2): 0.5 mg via ORAL
  Filled 2018-01-25 (×2): qty 2

## 2018-01-25 MED ORDER — OXYCODONE-ACETAMINOPHEN 5-325 MG PO TABS
1.0000 | ORAL_TABLET | ORAL | Status: DC | PRN
  Administered 2018-01-25 – 2018-01-26 (×2): 1 via ORAL
  Filled 2018-01-25 (×2): qty 1

## 2018-01-25 MED ORDER — DIPHENOXYLATE-ATROPINE 2.5-0.025 MG PO TABS: 0.5000 | ORAL_TABLET | Freq: Two times a day (BID) | ORAL | Status: DC | PRN

## 2018-01-25 MED ORDER — OXYCODONE-ACETAMINOPHEN 5-325 MG PO TABS
2.0000 | ORAL_TABLET | ORAL | Status: DC | PRN
  Administered 2018-01-26 (×2): 2 via ORAL
  Filled 2018-01-25 (×2): qty 2

## 2018-01-25 MED ORDER — MONTELUKAST SODIUM 10 MG PO TABS
10.0000 mg | ORAL_TABLET | Freq: Every day | ORAL | Status: DC
  Administered 2018-01-26: 10 mg via ORAL
  Filled 2018-01-25: qty 1

## 2018-01-25 MED ORDER — FLUTICASONE PROPIONATE 50 MCG/ACT NA SUSP
1.0000 | Freq: Every day | NASAL | Status: DC
  Administered 2018-01-25: 1 via NASAL
  Filled 2018-01-25: qty 16

## 2018-01-25 MED ORDER — ASPIRIN 81 MG PO CHEW
81.0000 mg | CHEWABLE_TABLET | Freq: Every day | ORAL | Status: DC
  Administered 2018-01-25 – 2018-01-26 (×2): 81 mg via ORAL
  Filled 2018-01-25 (×2): qty 1

## 2018-01-25 MED ORDER — SIMVASTATIN 20 MG PO TABS
20.0000 mg | ORAL_TABLET | Freq: Every day | ORAL | Status: DC
  Administered 2018-01-25 – 2018-01-26 (×2): 20 mg via ORAL
  Filled 2018-01-25 (×2): qty 1

## 2018-01-25 MED ORDER — TORSEMIDE 20 MG PO TABS
20.0000 mg | ORAL_TABLET | Freq: Two times a day (BID) | ORAL | Status: DC
  Administered 2018-01-25 – 2018-01-26 (×3): 20 mg via ORAL
  Filled 2018-01-25 (×3): qty 1

## 2018-01-25 MED ORDER — BISACODYL 10 MG RE SUPP
10.0000 mg | Freq: Once | RECTAL | Status: AC
  Administered 2018-01-25: 10 mg via RECTAL
  Filled 2018-01-25 (×2): qty 1

## 2018-01-25 MED ORDER — OXYCODONE-ACETAMINOPHEN 5-325 MG PO TABS
1.0000 | ORAL_TABLET | ORAL | Status: DC | PRN
  Administered 2018-01-25: 11:00:00 1 via ORAL
  Filled 2018-01-25: qty 1

## 2018-01-25 MED ORDER — VITAMIN D 25 MCG (1000 UNIT) PO TABS
1000.0000 [IU] | ORAL_TABLET | Freq: Every day | ORAL | Status: DC
  Administered 2018-01-25 – 2018-01-26 (×2): 1000 [IU] via ORAL
  Filled 2018-01-25 (×2): qty 1

## 2018-01-25 MED ORDER — CALCIUM CARBONATE ANTACID 500 MG PO CHEW
600.0000 mg | CHEWABLE_TABLET | Freq: Two times a day (BID) | ORAL | Status: DC
  Administered 2018-01-25: 600 mg via ORAL
  Filled 2018-01-25 (×3): qty 3

## 2018-01-25 MED ORDER — OXYCODONE-ACETAMINOPHEN 5-325 MG PO TABS
1.0000 | ORAL_TABLET | Freq: Four times a day (QID) | ORAL | Status: DC | PRN
  Administered 2018-01-25: 05:00:00 1 via ORAL
  Filled 2018-01-25: qty 1

## 2018-01-25 MED ORDER — OXYCODONE-ACETAMINOPHEN 5-325 MG PO TABS: 1.0000 | ORAL_TABLET | Freq: Four times a day (QID) | ORAL | Status: DC | PRN

## 2018-01-25 MED ORDER — PANTOPRAZOLE SODIUM 40 MG PO TBEC
40.0000 mg | DELAYED_RELEASE_TABLET | Freq: Every day | ORAL | Status: DC
  Administered 2018-01-25 – 2018-01-26 (×2): 40 mg via ORAL
  Filled 2018-01-25 (×2): qty 1

## 2018-01-25 MED ORDER — POTASSIUM CHLORIDE CRYS ER 20 MEQ PO TBCR
20.0000 meq | EXTENDED_RELEASE_TABLET | Freq: Two times a day (BID) | ORAL | Status: DC
  Administered 2018-01-25 – 2018-01-26 (×3): 20 meq via ORAL
  Filled 2018-01-25 (×3): qty 1

## 2018-01-25 NOTE — Progress Notes (Signed)
[facility] [place] NAME: Natasha [name]    MR#:  [id number]  DATE OF BIRTH:  03/22/1928  SUBJECTIVE:   Patient presented to the hospital due to left stump pain along with hypoxemia noted to have pneumonia.  Still complaining of pain near her left stump post AKA, also has a cough which is been intermittently productive, with some wheezing.  REVIEW OF SYSTEMS:    Review of Systems  Constitutional: Negative for chills and fever.  HENT: Negative for congestion and tinnitus.   Eyes: Negative for blurred vision and double vision.  Respiratory: Positive for cough and wheezing. Negative for shortness of breath.   Cardiovascular: Negative for chest pain, orthopnea and PND.  Gastrointestinal: Negative for abdominal pain, diarrhea, nausea and vomiting.  Genitourinary: Negative for dysuria and hematuria.  Neurological: Negative for dizziness, sensory change and focal weakness.  All other systems reviewed and are negative.   Nutrition: Heart Healthy Tolerating Diet: yes Tolerating PT: Bedbound  DRUG ALLERGIES:   Allergies  Allergen Reactions  . Codeine Diarrhea    Patient can tolerate small amounts of OXYCODONE per patient info sheet  . Hydrocodone Nausea And Vomiting    Patient can tolerate small amounts of OXYCODONE per patient info sheet  . Levofloxacin Other (See Comments)    Muscle cramps  . Tramadol Nausea And Vomiting    VITALS:  Blood pressure (!) 128/47, pulse 79, temperature 98 F (36.7 C), temperature source Oral, resp. rate 16, height 4\' 11"  (1.499 m), weight 59.9 kg, SpO2 95 %.  PHYSICAL EXAMINATION:   Physical Exam  GENERAL:  85 y.o.-year-old patient lying in bed in no acute distress.  EYES: Pupils equal, round, reactive to light and accommodation. No scleral icterus. Extraocular muscles intact.  HEENT: Head atraumatic, normocephalic. Oropharynx and nasopharynx clear.  NECK:  Supple, no jugular venous distention.  No thyroid enlargement, no tenderness.  LUNGS: Good A/E b/l, diffuse end-exp wheezing b/l, NO rales, rhonchi. No use of accessory muscles of respiration.  CARDIOVASCULAR: S1, S2 normal. No murmurs, rubs, or gallops.  ABDOMEN: Soft, nontender, nondistended. Bowel sounds present. No organomegaly or mass.  EXTREMITIES: No cyanosis, clubbing or edema b/l.   Left AKA.  NEUROLOGIC: Cranial nerves II through XII are intact. No focal Motor or sensory deficits b/l.   PSYCHIATRIC: The patient is alert and oriented x 3.  SKIN: No obvious rash, lesion, or ulcer.    LABORATORY PANEL:   CBC Recent Labs  Lab 01/25/18 0356  WBC 7.8  HGB 9.5*  HCT 30.8*  PLT 441*   ------------------------------------------------------------------------------------------------------------------  Chemistries  Recent Labs  Lab 01/24/18 0909 01/25/18 0356  NA 133* 133*  K 4.3 4.3  CL 90* 96*  CO2 33* 29  GLUCOSE 101* 89  BUN 24* 23  CREATININE 0.80 0.83  CALCIUM 10.2 9.3  AST 22  --   ALT 13  --   ALKPHOS 98  --   BILITOT 0.3  --    ------------------------------------------------------------------------------------------------------------------  Cardiac Enzymes Recent Labs  Lab 01/24/18 0909  TROPONINI <0.03   ------------------------------------------------------------------------------------------------------------------  RADIOLOGY:  Ct Angio Chest Pe W And/or Wo Contrast  Result Date: 01/24/2018 CLINICAL DATA:  Decreased oxygen saturation with hypoxia EXAM: CT ANGIOGRAPHY CHEST WITH CONTRAST TECHNIQUE: Multidetector CT imaging of the chest was performed using the standard protocol during bolus administration of intravenous contrast. Multiplanar CT image reconstructions and MIPs were obtained to evaluate the vascular anatomy. CONTRAST:  28mL OMNIPAQUE IOHEXOL 350 MG/ML  SOLN COMPARISON:  Chest CT March 27, 2016; chest radiograph January 12, 2018 FINDINGS: Cardiovascular: There is no appreciable  pulmonary embolus. There is no thoracic aortic aneurysm or dissection. Visualized great vessels appear unremarkable. There is aortic atherosclerosis. There is no appreciable pericardial effusion or pericardial thickening. Mediastinum/Nodes: Thyroid appears unremarkable. There are several calcified lymph nodes consistent with prior granulomatous disease. There is a lymph node in the anterior mediastinum which is upper limits normal in size, stable from previous study from 2018. There is a stable right hilar lymph node with short axis diameter of 9 mm, upper normal. No frank adenopathy by size criteria evident. There is a sizable paraesophageal type hernia, primarily to the right of midline, unchanged. Lungs/Pleura: There is underlying centrilobular emphysematous change. There are focal areas of airspace consolidation in each lower lobe along with atelectatic change. There are small pleural effusions bilaterally. There is atelectatic changes portions of each posterior segment upper lobe. Upper Abdomen: Visualized upper abdominal structures appear unremarkable except for upper abdominal aortic atherosclerosis. Musculoskeletal: There are no blastic or lytic bone lesions. There are no appreciable chest wall lesions. Review of the MIP images confirms the above findings. IMPRESSION: 1. No demonstrable pulmonary embolus. No thoracic aortic aneurysm or dissection. There is aortic atherosclerosis. 2. Large paraesophageal hernia, primarily located to the right of midline. 3. Areas of patchy airspace consolidation felt to represent pneumonia in each lower lobe with associated atelectatic change. Small pleural effusions noted bilaterally. Areas of atelectatic change in the posterior segment of each upper lobe. Underlying centrilobular emphysematous change. 4. Upper normal in size right hilar and anterior mediastinal lymph nodes. No frank adenopathy by size criteria. Aortic Atherosclerosis (ICD10-I70.0) and Emphysema  (ICD10-J43.9). Electronically Signed   By: Lowella Grip III M.D.   On: 01/24/2018 10:16     ASSESSMENT AND PLAN:   85 year old female with past medical history of peripheral vascular disease status post left AKA recently, restless leg syndrome, neuropathy, hypertension, GERD, deafness status post cochlear implants, who presented to the hospital due to pain in her left stump and also wheezing, cough and hypoxemia.  1.  Acute on chronic respiratory failure with hypoxia- secondary to pneumonia as noted on CT chest. -Continue O2 supplementation, continue treatment of underlying pneumonia with IV cefepime, DC vancomycin as MRSA PCR (-).    2.  Pneumonia-suspected to be nosocomial pneumonia. -Continue IV cefepime, DC vancomycin, follow cultures.  3.  Left stump pain-patient is status post recent left AKA. - The wound looks okay, patient likely has some phantom pain. -Continue oxycodone for now.  4.  History of peripheral vascular disease-continue Eliquis.  Continue aspirin.  5.  Anxiety-continue Xanax as needed.  6.  Restless leg syndrome-continue Mirapex.  7.  Hyperlipidemia-continue Zocor.    All the records are reviewed and case discussed with Care Management/Social Worker. Management plans discussed with the patient, family and they are in agreement.  CODE STATUS: DNR  DVT Prophylaxis: Eliquis  TOTAL TIME TAKING CARE OF THIS PATIENT: 30 minutes.   POSSIBLE D/C IN 1-2 DAYS, DEPENDING ON CLINICAL CONDITION.   Henreitta Leber M.D on 01/25/2018 at 2:53 PM  Between 7am to 6pm - Pager - [phone]  After 6pm go to [email or web address] - Proofreader  [facility]  Office  [phone]  CC: Primary care physician; Rusty Aus, MD

## 2018-01-26 MED ORDER — DOXYCYCLINE HYCLATE 100 MG PO CAPS: 100.0000 mg | ORAL_CAPSULE | Freq: Two times a day (BID) | ORAL | 0 refills | Status: AC

## 2018-01-26 MED ORDER — OXYCODONE HCL 5 MG PO TABS: 5.0000 mg | ORAL_TABLET | ORAL | 0 refills | Status: AC | PRN

## 2018-01-26 MED ORDER — ALPRAZOLAM 0.25 MG PO TABS: 0.2500 mg | ORAL_TABLET | Freq: Two times a day (BID) | ORAL | 0 refills | Status: DC | PRN

## 2018-01-26 NOTE — Discharge Summary (Signed)
[facility] NAME: Natasha [name]    MR#:  [id number]  DATE OF BIRTH:  Nov 10, 1928  DATE OF ADMISSION:  01/24/2018 ADMITTING PHYSICIAN: Saundra Shelling, MD  DATE OF DISCHARGE: 01/26/2018  PRIMARY CARE PHYSICIAN: Rusty Aus, MD    ADMISSION DIAGNOSIS:  HCAP (healthcare-associated pneumonia) [J18.9]  DISCHARGE DIAGNOSIS:  Active Problems:   HCAP (healthcare-associated pneumonia)   SECONDARY DIAGNOSIS:   Past Medical History:  Diagnosis Date  . Anemia   . Arthritis   . Asthma   . Bilateral pneumonia may 2017  . Bronchitis   . Cardiomegaly   . Chronic respiratory failure with hypoxia ([place])   . Cochlear implant in place    bilateral, [name], 04/26/2007   . Gallstones 2015  . GERD (gastroesophageal reflux disease)   . H/O blood clots 2014  . History of home oxygen therapy    at night  . Hypertension   . Hypokalemia   . Peripheral vascular disease ([place])    with arterial clots- on xarelto  . Polyneuropathy   . Renal disorder    Chronic Kidney Disease, Stage 5  . Restless leg syndrome     HOSPITAL COURSE:   85 year old female with past medical history of peripheral vascular disease status post left AKA recently, restless leg syndrome, neuropathy, hypertension, GERD, deafness status post cochlear implants, who presented to the hospital due to pain in her left stump and also wheezing, cough and hypoxemia.  1.  Acute on chronic respiratory failure with hypoxia- secondary to pneumonia as noted on CT chest. -She was treated with IV antibiotics for her pneumonia initially with vancomycin, cefepime.  Vancomycin discontinued as MRSA PCR was negative.  Patient has been afebrile, hypoxia is improved she is back down to her baseline home oxygen at 2 L.  She will be discharged on oral doxycycline for additional 5 days.  She has no wheezing or bronchospasm.  2.  Pneumonia-suspected to be nosocomial pneumonia. Initially  treated with IV vancomycin, cefepime then narrowed down to just IV cefepime and now being discharged on oral doxycycline.  She is afebrile, her cultures are all negative.  3.  Left stump pain-patient is status post recent left AKA. - The wound looks to be healing well, patient likely has some phantom pain. - She will Continue oxycodone for now.  4.  History of peripheral vascular disease- she will cont. Her Eliquis and ASA  5.  Anxiety- she will continue Xanax as needed.  6.  Restless leg syndrome- she will continue Mirapex.  7.  Hyperlipidemia- she will continue Zocor.  8.  COPD-patient had no acute exacerbation.  She will continue her albuterol nebs and Pulmicort nebs.  Stable to be discharged back to her SNF.   DISCHARGE CONDITIONS:   Stable.   CONSULTS OBTAINED:    DRUG ALLERGIES:   Allergies  Allergen Reactions  . Codeine Diarrhea    Patient can tolerate small amounts of OXYCODONE per patient info sheet  . Hydrocodone Nausea And Vomiting    Patient can tolerate small amounts of OXYCODONE per patient info sheet  . Levofloxacin Other (See Comments)    Muscle cramps  . Tramadol Nausea And Vomiting    DISCHARGE MEDICATIONS:   Allergies as of 01/26/2018      Reactions   Codeine Diarrhea   Patient can tolerate small amounts of OXYCODONE per patient info sheet   Hydrocodone Nausea And Vomiting   Patient can tolerate small amounts  of OXYCODONE per patient info sheet   Levofloxacin Other (See Comments)   Muscle cramps   Tramadol Nausea And Vomiting      Medication List    STOP taking these medications   ondansetron 4 MG tablet Commonly known as:  ZOFRAN     TAKE these medications   acetaminophen 325 MG tablet Commonly known as:  TYLENOL Take 975 mg by mouth every 8 (eight) hours.   albuterol 108 (90 Base) MCG/ACT inhaler Commonly known as:  PROVENTIL HFA;VENTOLIN HFA Inhale 2 puffs into the lungs 3 (three) times daily as needed for shortness of breath  or wheezing.   ALPRAZolam 0.25 MG tablet Commonly known as:  XANAX Take 1 tablet (0.25 mg total) by mouth 2 (two) times daily as needed for anxiety.   apixaban 2.5 MG Tabs tablet Commonly known as:  ELIQUIS Take 2.5 mg by mouth every 12 (twelve) hours.   aspirin 81 MG chewable tablet Chew 81 mg by mouth daily.   budesonide 0.5 MG/2ML nebulizer solution Commonly known as:  PULMICORT Take 2 mLs (0.5 mg total) by nebulization 2 (two) times daily.   calcium carbonate 1500 (600 Ca) MG Tabs tablet Commonly known as:  OSCAL Take 600 mg of elemental calcium by mouth 2 (two) times daily with a meal.   celecoxib 100 MG capsule Commonly known as:  CELEBREX Take 100 mg by mouth every 12 (twelve) hours.   cholecalciferol 25 MCG (1000 UT) tablet Commonly known as:  VITAMIN D3 Take 1,000 Units by mouth daily.   diltiazem 180 MG 24 hr capsule Commonly known as:  CARDIZEM CD Take 180 mg by mouth daily.   diphenoxylate-atropine 2.5-0.025 MG tablet Commonly known as:  LOMOTIL Take 0.5 tablets by mouth 2 (two) times daily as needed for diarrhea or loose stools.   doxycycline 100 MG capsule Commonly known as:  VIBRAMYCIN Take 1 capsule (100 mg total) by mouth 2 (two) times daily for 5 days.   magnesium oxide 400 MG tablet Commonly known as:  MAG-OX Take 400 mg by mouth daily.   Melatonin 5 MG Tabs Take 5 mg by mouth Nightly.   mometasone 50 MCG/ACT nasal spray Commonly known as:  NASONEX Place 1 spray into the nose daily.   montelukast 10 MG tablet Commonly known as:  SINGULAIR Take 10 mg by mouth Nightly.   oxyCODONE 5 MG immediate release tablet Commonly known as:  Oxy IR/ROXICODONE Take 1-2 tablets (5-10 mg total) by mouth every 4 (four) hours as needed for up to 5 days. What changed:    when to take this  reasons to take this   pantoprazole 40 MG tablet Commonly known as:  PROTONIX Take 40 mg by mouth daily.   polyethylene glycol packet Commonly known as:  MIRALAX  / GLYCOLAX Take 17 g by mouth daily.   potassium chloride SA 20 MEQ tablet Commonly known as:  K-DUR,KLOR-CON Take 20 mEq by mouth 2 (two) times daily.   pramipexole 0.5 MG tablet Commonly known as:  MIRAPEX Take 0.5 mg by mouth daily.   senna-docusate 8.6-50 MG tablet Commonly known as:  Senokot-S Take 1-2 tablets by mouth 2 (two) times daily as needed for constipation.   simvastatin 20 MG tablet Commonly known as:  ZOCOR Take 20 mg by mouth daily.   torsemide 20 MG tablet Commonly known as:  DEMADEX Take 20 mg by mouth 2 (two) times daily.         DISCHARGE INSTRUCTIONS:   DIET:  Cardiac  diet  DISCHARGE CONDITION:  Stable  ACTIVITY:  Activity as tolerated  OXYGEN:  Home Oxygen: Yes.     Oxygen Delivery: 2 liters/min via Patient connected to nasal cannula oxygen  DISCHARGE LOCATION:  nursing home   If you experience worsening of your admission symptoms, develop shortness of breath, life threatening emergency, suicidal or homicidal thoughts you must seek medical attention immediately by calling 911 or calling your MD immediately  if symptoms less severe.  You Must read complete instructions/literature along with all the possible adverse reactions/side effects for all the Medicines you take and that have been prescribed to you. Take any new Medicines after you have completely understood and accpet all the possible adverse reactions/side effects.   Please note  You were cared for by a hospitalist during your hospital stay. If you have any questions about your discharge medications or the care you received while you were in the hospital after you are discharged, you can call the unit and asked to speak with the hospitalist on call if the hospitalist that took care of you is not available. Once you are discharged, your primary care physician will handle any further medical issues. Please note that NO REFILLS for any discharge medications will be authorized once you  are discharged, as it is imperative that you return to your primary care physician (or establish a relationship with a primary care physician if you do not have one) for your aftercare needs so that they can reassess your need for medications and monitor your lab values.     Today   Acute events overnight, patient's pain in her left stump much improved.  No wheezing, bronchospasm, afebrile, cultures are negative.  Will discharge on oral antibiotics back to skilled nursing facility today.  VITAL SIGNS:  Blood pressure (!) 125/43, pulse 74, temperature 98.4 F (36.9 C), temperature source Oral, resp. rate 16, height 4\' 11"  (1.499 m), weight 59.9 kg, SpO2 96 %.  I/O:    Intake/Output Summary (Last 24 hours) at 01/26/2018 1252 Last data filed at 01/26/2018 0546 Gross per 24 hour  Intake 711.37 ml  Output 3100 ml  Net -2388.63 ml    PHYSICAL EXAMINATION:   GENERAL:  85 y.o.-year-old patient lying in bed in no acute distress.  EYES: Pupils equal, round, reactive to light and accommodation. No scleral icterus. Extraocular muscles intact.  HEENT: Head atraumatic, normocephalic. Oropharynx and nasopharynx clear.  NECK:  Supple, no jugular venous distention. No thyroid enlargement, no tenderness.  LUNGS: Good A/E b/l, diffuse end-exp wheezing b/l, NO rales, rhonchi. No use of accessory muscles of respiration.  CARDIOVASCULAR: S1, S2 normal. No murmurs, rubs, or gallops.  ABDOMEN: Soft, nontender, nondistended. Bowel sounds present. No organomegaly or mass.  EXTREMITIES: No cyanosis, clubbing or edema b/l.   Left AKA.  NEUROLOGIC: Cranial nerves II through XII are intact. No focal Motor or sensory deficits b/l.   PSYCHIATRIC: The patient is alert and oriented x 3.  SKIN: No obvious rash, lesion, or ulcer.   DATA REVIEW:   CBC Recent Labs  Lab 01/25/18 0356  WBC 7.8  HGB 9.5*  HCT 30.8*  PLT 441*    Chemistries  Recent Labs  Lab 01/24/18 0909 01/25/18 0356  NA 133* 133*  K  4.3 4.3  CL 90* 96*  CO2 33* 29  GLUCOSE 101* 89  BUN 24* 23  CREATININE 0.80 0.83  CALCIUM 10.2 9.3  AST 22  --   ALT 13  --   ALKPHOS 98  --  BILITOT 0.3  --     Cardiac Enzymes Recent Labs  Lab 01/24/18 0909  TROPONINI <0.03    Microbiology Results  Results for orders placed or performed during the hospital encounter of 01/24/18  Blood culture (routine x 2)     Status: None (Preliminary result)   Collection Time: 01/24/18 10:47 AM  Result Value Ref Range Status   Specimen Description BLOOD RIGHT FA  Final   Special Requests   Final    BOTTLES DRAWN AEROBIC AND ANAEROBIC Blood Culture results may not be optimal due to an excessive volume of blood received in culture bottles   Culture   Final    NO GROWTH 2 DAYS Performed at [facility], [address], [place], [place] [address]    Report Status PENDING  Incomplete  Blood culture (routine x 2)     Status: None (Preliminary result)   Collection Time: 01/24/18 10:48 AM  Result Value Ref Range Status   Specimen Description BLOOD RIGHT ANTECUBITAL  Final   Special Requests   Final    BOTTLES DRAWN AEROBIC AND ANAEROBIC Blood Culture adequate volume   Culture   Final    NO GROWTH 2 DAYS Performed at [facility], [address], [place], [place] [address]    Report Status PENDING  Incomplete  MRSA PCR Screening     Status: None   Collection Time: 01/24/18  6:59 PM  Result Value Ref Range Status   MRSA by PCR NEGATIVE NEGATIVE Final    Comment:        The GeneXpert MRSA Assay (FDA approved for NASAL specimens only), is one component of a comprehensive MRSA colonization surveillance program. It is not intended to diagnose MRSA infection nor to guide or monitor treatment for MRSA infections. Performed at [facility], [address], [place],  [address]     RADIOLOGY:  No results found.    Management plans discussed with the patient, family and they are in  agreement.  CODE STATUS:     Code Status Orders  (From admission, onward)         Start     Ordered   01/24/18 1223  Do not attempt resuscitation (DNR)  Continuous    Question Answer Comment  In the event of cardiac or respiratory ARREST Do not call a "code blue"   In the event of cardiac or respiratory ARREST Do not perform Intubation, CPR, defibrillation or ACLS   In the event of cardiac or respiratory ARREST Use medication by any route, position, wound care, and other measures to relive pain and suffering. May use oxygen, suction and manual treatment of airway obstruction as needed for comfort.      01/24/18 1222        TOTAL TIME TAKING CARE OF THIS PATIENT: 40 minutes.    Henreitta Leber M.D on 01/26/2018 at 12:52 PM  Between 7am to 6pm - Pager - [phone]  After 6pm go to [email or web address] - Proofreader  [facility]  Office  [phone]  CC: Primary care physician; Rusty Aus, MD

## 2018-01-26 NOTE — NC FL2 (Signed)
[place] LEVEL OF CARE SCREENING TOOL     IDENTIFICATION  Patient Name: Natasha [name] Birthdate: [birth year] Sex: female Admission Date (Current Location): 01/24/2018  [facility] and [place] Number:  Engineering geologist and Address:  [facility], [address], [place], [place] [address]      Provider Number: [id number]  Attending Physician Name and Address:  Henreitta Leber, MD  Relative Name and Phone Number:       Current Level of Care: Hospital Recommended Level of Care: [place] Prior Approval Number:    Date Approved/Denied:   PASRR Number:    Discharge Plan: SNF    Current Diagnoses: Patient Active Problem List   Diagnosis Date Noted  . Arterial occlusion 01/14/2018  . Sacral fracture ([place]) 11/15/2016  . HTN (hypertension) 11/15/2016  . GERD (gastroesophageal reflux disease) 11/15/2016  . RLS (restless legs syndrome) 11/15/2016  . Asthma 11/15/2016  . Back pain 10/29/2016  . CAP (community acquired pneumonia) 03/27/2016  . Cellulitis 03/27/2016  . Hypokalemia 03/27/2016  . Acute respiratory failure ([place]) 03/27/2016  . Sepsis ([place]) 08/24/2015  . HCAP (healthcare-associated pneumonia) 06/27/2015  . Pneumonia 06/07/2015  . Hypotension 03/11/2015  . Lower extremity atheroembolism ([place]) 02/27/2015  . Ischemic leg 02/27/2015  . Calculus of gallbladder with other cholecystitis, without mention of obstruction 04/20/2013    Orientation RESPIRATION BLADDER Height & Weight     Self, Place  Normal Indwelling catheter Weight: 132 lb (59.9 kg) Height:  4\' 11"  (149.9 cm)  BEHAVIORAL SYMPTOMS/MOOD NEUROLOGICAL BOWEL NUTRITION STATUS  (none) (none) Continent Diet(Heart healthy )  AMBULATORY STATUS COMMUNICATION OF NEEDS Skin   Extensive Assist Verbally Normal                       Personal Care Assistance Level of Assistance  Bathing, Feeding, Dressing Bathing Assistance: Maximum  assistance Feeding assistance: Independent Dressing Assistance: Limited assistance     Functional Limitations Info  Sight, Hearing, Speech Sight Info: Adequate Hearing Info: Adequate Speech Info: Adequate    SPECIAL CARE FACTORS FREQUENCY  PT (By licensed PT), OT (By licensed OT)                    Contractures Contractures Info: Not present    Additional Factors Info  Code Status, Allergies Code Status Info: DNR Allergies Info:  Codeine, Hydrocodone, Levofloxacin, Tramadol           Current Medications (01/26/2018):  This is the current hospital active medication list Current Facility-Administered Medications  Medication Dose Route Frequency Provider Last Rate Last Dose  . 0.9 %  sodium chloride infusion  250 mL Intravenous PRN Saundra Shelling, MD   Stopped at 01/26/18 0328  . acetaminophen (TYLENOL) tablet 650 mg  650 mg Oral Q6H PRN Saundra Shelling, MD   650 mg at 01/25/18 0052   Or  . acetaminophen (TYLENOL) suppository 650 mg  650 mg Rectal Q6H PRN Pyreddy, Reatha Harps, MD      . albuterol (PROVENTIL) (2.5 MG/3ML) 0.083% nebulizer solution 3 mL  3 mL Inhalation TID PRN Saundra Shelling, MD      . ALPRAZolam Duanne Moron) tablet 0.25 mg  0.25 mg Oral BID PRN Saundra Shelling, MD   0.25 mg at 01/26/18 0039  . apixaban (ELIQUIS) tablet 2.5 mg  2.5 mg Oral Q12H Pyreddy, Reatha Harps, MD   2.5 mg at 01/26/18 0933  . aspirin chewable tablet 81 mg  81 mg Oral Daily Sainani,  Belia Heman, MD   81 mg at 01/26/18 0932  . budesonide (PULMICORT) nebulizer solution 0.5 mg  0.5 mg Nebulization BID Saundra Shelling, MD   0.5 mg at 01/25/18 2038  . calcium carbonate (TUMS - dosed in mg elemental calcium) chewable tablet 600 mg of elemental calcium  600 mg of elemental calcium Oral BID WC Henreitta Leber, MD   600 mg of elemental calcium at 01/25/18 1626  . ceFEPIme (MAXIPIME) 2 g in sodium chloride 0.9 % 100 mL IVPB  2 g Intravenous Q12H Pyreddy, Reatha Harps, MD 200 mL/hr at 01/26/18 0958 2 g at 01/26/18 0958  .  celecoxib (CELEBREX) capsule 100 mg  100 mg Oral Q12H Pyreddy, Reatha Harps, MD   100 mg at 01/26/18 0953  . cholecalciferol (VITAMIN D3) tablet 1,000 Units  1,000 Units Oral Daily Henreitta Leber, MD   1,000 Units at 01/26/18 0933  . diltiazem (CARDIZEM CD) 24 hr capsule 180 mg  180 mg Oral Daily Pyreddy, Reatha Harps, MD   180 mg at 01/26/18 0953  . diphenoxylate-atropine (LOMOTIL) 2.5-0.025 MG per tablet 0.5 tablet  0.5 tablet Oral BID PRN Henreitta Leber, MD      . fluticasone (FLONASE) 50 MCG/ACT nasal spray 1 spray  1 spray Each Nare Daily Henreitta Leber, MD   1 spray at 01/25/18 1639  . montelukast (SINGULAIR) tablet 10 mg  10 mg Oral QHS Henreitta Leber, MD   10 mg at 01/26/18 0039  . ondansetron (ZOFRAN) tablet 4 mg  4 mg Oral Q6H PRN Pyreddy, Reatha Harps, MD       Or  . ondansetron (ZOFRAN) injection 4 mg  4 mg Intravenous Q6H PRN Pyreddy, Pavan, MD      . oxyCODONE-acetaminophen (PERCOCET/ROXICET) 5-325 MG per tablet 1 tablet  1 tablet Oral Q4H PRN Henreitta Leber, MD   1 tablet at 01/25/18 1615  . oxyCODONE-acetaminophen (PERCOCET/ROXICET) 5-325 MG per tablet 2 tablet  2 tablet Oral Q4H PRN Henreitta Leber, MD   2 tablet at 01/26/18 0654  . pantoprazole (PROTONIX) EC tablet 40 mg  40 mg Oral Daily Henreitta Leber, MD   40 mg at 01/26/18 0932  . polyethylene glycol (MIRALAX / GLYCOLAX) packet 17 g  17 g Oral Daily PRN Henreitta Leber, MD      . potassium chloride SA (K-DUR,KLOR-CON) CR tablet 20 mEq  20 mEq Oral BID Henreitta Leber, MD   20 mEq at 01/26/18 0932  . pramipexole (MIRAPEX) tablet 0.5 mg  0.5 mg Oral Daily Henreitta Leber, MD   0.5 mg at 01/26/18 0933  . senna-docusate (Senokot-S) tablet 1 tablet  1 tablet Oral QHS PRN Saundra Shelling, MD   1 tablet at 01/26/18 0039  . simvastatin (ZOCOR) tablet 20 mg  20 mg Oral Daily Henreitta Leber, MD   20 mg at 01/26/18 0933  . sodium chloride flush (NS) 0.9 % injection 3 mL  3 mL Intravenous Q12H Pyreddy, Pavan, MD   3 mL at 01/26/18 1013  .  sodium chloride flush (NS) 0.9 % injection 3 mL  3 mL Intravenous PRN Saundra Shelling, MD   3 mL at 01/24/18 1508  . torsemide (DEMADEX) tablet 20 mg  20 mg Oral BID Henreitta Leber, MD   20 mg at 01/26/18 4496     Discharge Medications: Please see discharge summary for a list of discharge medications.  Relevant Imaging Results:  Relevant Lab Results:   Additional Information    Karole Oo  Louretta Shorten, [place]

## 2018-01-26 NOTE — Clinical Social Work Note (Signed)
Clinical Social Work Assessment  Patient Details  Name: Natasha [name] MRN: [id number] Date of Birth: 1928/03/21  Date of referral:  01/26/18               Reason for consult:  Facility Placement                Permission sought to share information with:  Case Manager, Customer service manager, Family Supports Permission granted to share information::  Yes, Verbal Permission Granted  Name::        Agency::     Relationship::     Contact Information:     Housing/Transportation Living arrangements for the past 2 months:  [place] of Information:  Adult Children Patient Interpreter Needed:  None Criminal Activity/Legal Involvement Pertinent to Current Situation/Hospitalization:  No - Comment as needed Significant Relationships:  Adult Children Lives with:  Facility Resident Do you feel safe going back to the place where you live?  Yes Need for family participation in patient care:  Yes (Comment)  Care giving concerns:  Patient lives at [facility]    Social Worker assessment / plan:  CSW met with patient and daughter at bedside. Daughter reports that patient lives at [facility] [place] [facility] but has recently been at [facility] due to recent amputation. Daughter would like patient to return to SNF. CSW spoke with Seth Bake at [facility] and she agrees that patient can come back to SNF when ready. CSW will continue to follow for discharge planning.   Employment status:  Retired Forensic scientist:  Medicare PT Recommendations:  Not assessed at this time Information / Referral to community resources:     Patient/Family's Response to care:  Daughter thanked CSW for assistance   Patient/Family's Understanding of and Emotional Response to Diagnosis, Current Treatment, and Prognosis:  Daughter understands current diagnoses and treatment.   Emotional Assessment Appearance:  Appears stated age Attitude/Demeanor/Rapport:    Affect  (typically observed):  Accepting, Pleasant Orientation:  Oriented to Self, Oriented to Place Alcohol / Substance use:  Not Applicable Psych involvement (Current and /or in the community):  No (Comment)  Discharge Needs  Concerns to be addressed:  Discharge Planning Concerns Readmission within the last 30 days:  Yes Current discharge risk:  Dependent with Mobility Barriers to Discharge:  Continued Medical Work up   [employer], [place] 01/26/2018, 12:11 PM

## 2018-01-26 NOTE — Progress Notes (Signed)
Reoport given to nurse at [facility] , EMS called. Collier Bullock RN

## 2018-01-26 NOTE — Clinical Social Work Note (Signed)
Patient is medically ready for discharge today. CSW notified patient and daughter at bedside of discharge back to [facility] today. CSW notified Seth Bake at [facility] of discharge today. Patient will be transported by EMS. RN to call report and call for EMS.   [place], [place]

## 2018-01-27 DIAGNOSIS — J9611 Chronic respiratory failure with hypoxia: Secondary | ICD-10-CM | POA: Diagnosis not present

## 2018-01-27 DIAGNOSIS — I739 Peripheral vascular disease, unspecified: Secondary | ICD-10-CM | POA: Diagnosis not present

## 2018-01-27 DIAGNOSIS — J181 Lobar pneumonia, unspecified organism: Secondary | ICD-10-CM

## 2018-01-27 DIAGNOSIS — Z89612 Acquired absence of left leg above knee: Secondary | ICD-10-CM

## 2018-01-27 DIAGNOSIS — I82502 Chronic embolism and thrombosis of unspecified deep veins of left lower extremity: Secondary | ICD-10-CM | POA: Diagnosis not present

## 2018-01-29 LAB — CULTURE, BLOOD (ROUTINE X 2)
Culture: NO GROWTH
Culture: NO GROWTH
Special Requests: ADEQUATE

## 2018-02-20 DIAGNOSIS — K219 Gastro-esophageal reflux disease without esophagitis: Secondary | ICD-10-CM

## 2018-02-20 DIAGNOSIS — Z89612 Acquired absence of left leg above knee: Secondary | ICD-10-CM | POA: Diagnosis not present

## 2018-02-20 DIAGNOSIS — I82502 Chronic embolism and thrombosis of unspecified deep veins of left lower extremity: Secondary | ICD-10-CM | POA: Diagnosis not present

## 2018-02-20 DIAGNOSIS — I739 Peripheral vascular disease, unspecified: Secondary | ICD-10-CM

## 2018-02-20 DIAGNOSIS — J439 Emphysema, unspecified: Secondary | ICD-10-CM | POA: Diagnosis not present

## 2018-02-20 DIAGNOSIS — J9611 Chronic respiratory failure with hypoxia: Secondary | ICD-10-CM | POA: Diagnosis not present

## 2018-03-02 DIAGNOSIS — M19012 Primary osteoarthritis, left shoulder: Secondary | ICD-10-CM

## 2018-03-09 DIAGNOSIS — F39 Unspecified mood [affective] disorder: Secondary | ICD-10-CM

## 2018-03-14 DIAGNOSIS — M79622 Pain in left upper arm: Secondary | ICD-10-CM | POA: Diagnosis not present

## 2018-03-21 DIAGNOSIS — S7012XA Contusion of left thigh, initial encounter: Secondary | ICD-10-CM | POA: Diagnosis not present

## 2018-03-27 DIAGNOSIS — J441 Chronic obstructive pulmonary disease with (acute) exacerbation: Secondary | ICD-10-CM

## 2018-03-29 DIAGNOSIS — I10 Essential (primary) hypertension: Secondary | ICD-10-CM | POA: Diagnosis not present

## 2018-03-29 DIAGNOSIS — K219 Gastro-esophageal reflux disease without esophagitis: Secondary | ICD-10-CM | POA: Diagnosis not present

## 2018-03-29 DIAGNOSIS — J069 Acute upper respiratory infection, unspecified: Secondary | ICD-10-CM

## 2018-03-29 DIAGNOSIS — J449 Chronic obstructive pulmonary disease, unspecified: Secondary | ICD-10-CM | POA: Diagnosis not present

## 2018-03-29 DIAGNOSIS — I739 Peripheral vascular disease, unspecified: Secondary | ICD-10-CM

## 2018-03-29 DIAGNOSIS — G2581 Restless legs syndrome: Secondary | ICD-10-CM

## 2018-03-29 DIAGNOSIS — Z89612 Acquired absence of left leg above knee: Secondary | ICD-10-CM

## 2018-03-29 DIAGNOSIS — I82891 Chronic embolism and thrombosis of other specified veins: Secondary | ICD-10-CM

## 2018-04-05 DIAGNOSIS — B351 Tinea unguium: Secondary | ICD-10-CM

## 2018-04-13 DIAGNOSIS — I82891 Chronic embolism and thrombosis of other specified veins: Secondary | ICD-10-CM

## 2018-04-13 DIAGNOSIS — Z89612 Acquired absence of left leg above knee: Secondary | ICD-10-CM

## 2018-04-13 DIAGNOSIS — I739 Peripheral vascular disease, unspecified: Secondary | ICD-10-CM | POA: Diagnosis not present

## 2018-04-13 DIAGNOSIS — K219 Gastro-esophageal reflux disease without esophagitis: Secondary | ICD-10-CM

## 2018-04-13 DIAGNOSIS — J9611 Chronic respiratory failure with hypoxia: Secondary | ICD-10-CM | POA: Diagnosis not present

## 2018-04-13 DIAGNOSIS — J439 Emphysema, unspecified: Secondary | ICD-10-CM

## 2018-04-26 DIAGNOSIS — J9611 Chronic respiratory failure with hypoxia: Secondary | ICD-10-CM

## 2018-04-26 DIAGNOSIS — Z79899 Other long term (current) drug therapy: Secondary | ICD-10-CM

## 2018-04-26 DIAGNOSIS — G479 Sleep disorder, unspecified: Secondary | ICD-10-CM | POA: Diagnosis not present

## 2018-04-26 DIAGNOSIS — J449 Chronic obstructive pulmonary disease, unspecified: Secondary | ICD-10-CM

## 2018-05-05 DIAGNOSIS — M25561 Pain in right knee: Secondary | ICD-10-CM | POA: Diagnosis not present

## 2018-05-09 DIAGNOSIS — S8391XA Sprain of unspecified site of right knee, initial encounter: Secondary | ICD-10-CM

## 2018-06-15 DIAGNOSIS — Z89612 Acquired absence of left leg above knee: Secondary | ICD-10-CM | POA: Diagnosis not present

## 2018-06-15 DIAGNOSIS — T8789 Other complications of amputation stump: Secondary | ICD-10-CM

## 2018-06-21 DIAGNOSIS — I10 Essential (primary) hypertension: Secondary | ICD-10-CM | POA: Diagnosis not present

## 2018-06-21 DIAGNOSIS — J449 Chronic obstructive pulmonary disease, unspecified: Secondary | ICD-10-CM | POA: Diagnosis not present

## 2018-06-21 DIAGNOSIS — Z89612 Acquired absence of left leg above knee: Secondary | ICD-10-CM | POA: Diagnosis not present

## 2018-06-21 DIAGNOSIS — I82501 Chronic embolism and thrombosis of unspecified deep veins of right lower extremity: Secondary | ICD-10-CM

## 2018-06-21 DIAGNOSIS — I739 Peripheral vascular disease, unspecified: Secondary | ICD-10-CM

## 2018-06-21 DIAGNOSIS — G2581 Restless legs syndrome: Secondary | ICD-10-CM

## 2018-06-21 DIAGNOSIS — J9611 Chronic respiratory failure with hypoxia: Secondary | ICD-10-CM

## 2018-06-30 DIAGNOSIS — B351 Tinea unguium: Secondary | ICD-10-CM

## 2018-07-20 DIAGNOSIS — K1370 Unspecified lesions of oral mucosa: Secondary | ICD-10-CM

## 2018-07-28 DIAGNOSIS — T8789 Other complications of amputation stump: Secondary | ICD-10-CM

## 2018-07-28 DIAGNOSIS — J449 Chronic obstructive pulmonary disease, unspecified: Secondary | ICD-10-CM | POA: Diagnosis not present

## 2018-07-28 DIAGNOSIS — Z89612 Acquired absence of left leg above knee: Secondary | ICD-10-CM | POA: Diagnosis not present

## 2018-07-28 DIAGNOSIS — J9610 Chronic respiratory failure, unspecified whether with hypoxia or hypercapnia: Secondary | ICD-10-CM | POA: Diagnosis not present

## 2018-08-23 DIAGNOSIS — M79674 Pain in right toe(s): Secondary | ICD-10-CM | POA: Diagnosis not present

## 2018-08-24 DIAGNOSIS — I5032 Chronic diastolic (congestive) heart failure: Secondary | ICD-10-CM

## 2018-08-24 DIAGNOSIS — I739 Peripheral vascular disease, unspecified: Secondary | ICD-10-CM

## 2018-08-24 DIAGNOSIS — Z89612 Acquired absence of left leg above knee: Secondary | ICD-10-CM

## 2018-08-24 DIAGNOSIS — J439 Emphysema, unspecified: Secondary | ICD-10-CM

## 2018-08-24 DIAGNOSIS — I82509 Chronic embolism and thrombosis of unspecified deep veins of unspecified lower extremity: Secondary | ICD-10-CM

## 2018-08-24 DIAGNOSIS — J9611 Chronic respiratory failure with hypoxia: Secondary | ICD-10-CM

## 2018-08-29 DIAGNOSIS — M19011 Primary osteoarthritis, right shoulder: Secondary | ICD-10-CM

## 2018-09-26 DIAGNOSIS — J441 Chronic obstructive pulmonary disease with (acute) exacerbation: Secondary | ICD-10-CM | POA: Diagnosis not present

## 2018-10-11 DIAGNOSIS — R109 Unspecified abdominal pain: Secondary | ICD-10-CM | POA: Diagnosis not present

## 2018-10-11 DIAGNOSIS — K5900 Constipation, unspecified: Secondary | ICD-10-CM | POA: Diagnosis not present

## 2018-10-11 DIAGNOSIS — R110 Nausea: Secondary | ICD-10-CM | POA: Diagnosis not present

## 2018-10-17 DIAGNOSIS — L03114 Cellulitis of left upper limb: Secondary | ICD-10-CM | POA: Diagnosis not present

## 2018-10-25 DIAGNOSIS — K219 Gastro-esophageal reflux disease without esophagitis: Secondary | ICD-10-CM | POA: Diagnosis not present

## 2018-10-25 DIAGNOSIS — I739 Peripheral vascular disease, unspecified: Secondary | ICD-10-CM

## 2018-10-25 DIAGNOSIS — Z89612 Acquired absence of left leg above knee: Secondary | ICD-10-CM

## 2018-10-25 DIAGNOSIS — I5020 Unspecified systolic (congestive) heart failure: Secondary | ICD-10-CM | POA: Diagnosis not present

## 2018-10-25 DIAGNOSIS — J449 Chronic obstructive pulmonary disease, unspecified: Secondary | ICD-10-CM | POA: Diagnosis not present

## 2018-10-25 DIAGNOSIS — G2581 Restless legs syndrome: Secondary | ICD-10-CM

## 2018-10-25 DIAGNOSIS — I82409 Acute embolism and thrombosis of unspecified deep veins of unspecified lower extremity: Secondary | ICD-10-CM

## 2018-10-25 DIAGNOSIS — I10 Essential (primary) hypertension: Secondary | ICD-10-CM | POA: Diagnosis not present

## 2018-10-25 DIAGNOSIS — J9610 Chronic respiratory failure, unspecified whether with hypoxia or hypercapnia: Secondary | ICD-10-CM

## 2018-11-06 DIAGNOSIS — R05 Cough: Secondary | ICD-10-CM | POA: Diagnosis not present

## 2018-11-08 DIAGNOSIS — H6123 Impacted cerumen, bilateral: Secondary | ICD-10-CM | POA: Diagnosis not present

## 2018-12-11 DIAGNOSIS — M79604 Pain in right leg: Secondary | ICD-10-CM | POA: Diagnosis not present

## 2018-12-25 DIAGNOSIS — J439 Emphysema, unspecified: Secondary | ICD-10-CM | POA: Diagnosis not present

## 2018-12-25 DIAGNOSIS — J9611 Chronic respiratory failure with hypoxia: Secondary | ICD-10-CM | POA: Diagnosis not present

## 2018-12-25 DIAGNOSIS — I5032 Chronic diastolic (congestive) heart failure: Secondary | ICD-10-CM | POA: Diagnosis not present

## 2018-12-25 DIAGNOSIS — I82891 Chronic embolism and thrombosis of other specified veins: Secondary | ICD-10-CM

## 2018-12-25 DIAGNOSIS — Z89612 Acquired absence of left leg above knee: Secondary | ICD-10-CM | POA: Diagnosis not present

## 2018-12-25 DIAGNOSIS — I739 Peripheral vascular disease, unspecified: Secondary | ICD-10-CM

## 2019-01-15 DIAGNOSIS — S8011XA Contusion of right lower leg, initial encounter: Secondary | ICD-10-CM | POA: Diagnosis not present

## 2019-02-16 DIAGNOSIS — I739 Peripheral vascular disease, unspecified: Secondary | ICD-10-CM

## 2019-02-16 DIAGNOSIS — I5030 Unspecified diastolic (congestive) heart failure: Secondary | ICD-10-CM | POA: Diagnosis not present

## 2019-02-16 DIAGNOSIS — I82891 Chronic embolism and thrombosis of other specified veins: Secondary | ICD-10-CM

## 2019-02-16 DIAGNOSIS — Z89612 Acquired absence of left leg above knee: Secondary | ICD-10-CM

## 2019-02-16 DIAGNOSIS — G2581 Restless legs syndrome: Secondary | ICD-10-CM

## 2019-02-16 DIAGNOSIS — K219 Gastro-esophageal reflux disease without esophagitis: Secondary | ICD-10-CM | POA: Diagnosis not present

## 2019-02-16 DIAGNOSIS — I10 Essential (primary) hypertension: Secondary | ICD-10-CM | POA: Diagnosis not present

## 2019-02-16 DIAGNOSIS — J9611 Chronic respiratory failure with hypoxia: Secondary | ICD-10-CM

## 2019-02-16 DIAGNOSIS — J449 Chronic obstructive pulmonary disease, unspecified: Secondary | ICD-10-CM | POA: Diagnosis not present

## 2019-03-26 DIAGNOSIS — L600 Ingrowing nail: Secondary | ICD-10-CM | POA: Diagnosis not present

## 2019-05-01 DIAGNOSIS — Z89612 Acquired absence of left leg above knee: Secondary | ICD-10-CM

## 2019-05-01 DIAGNOSIS — I5032 Chronic diastolic (congestive) heart failure: Secondary | ICD-10-CM

## 2019-05-01 DIAGNOSIS — I82502 Chronic embolism and thrombosis of unspecified deep veins of left lower extremity: Secondary | ICD-10-CM

## 2019-05-01 DIAGNOSIS — J9611 Chronic respiratory failure with hypoxia: Secondary | ICD-10-CM

## 2019-05-01 DIAGNOSIS — I739 Peripheral vascular disease, unspecified: Secondary | ICD-10-CM

## 2019-05-01 DIAGNOSIS — J439 Emphysema, unspecified: Secondary | ICD-10-CM

## 2019-05-07 DIAGNOSIS — S8011XA Contusion of right lower leg, initial encounter: Secondary | ICD-10-CM

## 2019-05-17 DIAGNOSIS — L97919 Non-pressure chronic ulcer of unspecified part of right lower leg with unspecified severity: Secondary | ICD-10-CM | POA: Diagnosis not present

## 2019-05-21 DIAGNOSIS — R05 Cough: Secondary | ICD-10-CM | POA: Diagnosis not present

## 2019-05-31 DIAGNOSIS — R05 Cough: Secondary | ICD-10-CM | POA: Diagnosis not present

## 2019-06-15 DIAGNOSIS — J9611 Chronic respiratory failure with hypoxia: Secondary | ICD-10-CM

## 2019-06-15 DIAGNOSIS — I5030 Unspecified diastolic (congestive) heart failure: Secondary | ICD-10-CM | POA: Diagnosis not present

## 2019-06-15 DIAGNOSIS — I739 Peripheral vascular disease, unspecified: Secondary | ICD-10-CM

## 2019-06-15 DIAGNOSIS — K219 Gastro-esophageal reflux disease without esophagitis: Secondary | ICD-10-CM | POA: Diagnosis not present

## 2019-06-15 DIAGNOSIS — I82502 Chronic embolism and thrombosis of unspecified deep veins of left lower extremity: Secondary | ICD-10-CM

## 2019-06-15 DIAGNOSIS — G2581 Restless legs syndrome: Secondary | ICD-10-CM | POA: Diagnosis not present

## 2019-06-15 DIAGNOSIS — I10 Essential (primary) hypertension: Secondary | ICD-10-CM | POA: Diagnosis not present

## 2019-06-15 DIAGNOSIS — Z89612 Acquired absence of left leg above knee: Secondary | ICD-10-CM

## 2019-06-15 DIAGNOSIS — J449 Chronic obstructive pulmonary disease, unspecified: Secondary | ICD-10-CM

## 2019-07-11 DIAGNOSIS — R05 Cough: Secondary | ICD-10-CM | POA: Diagnosis not present

## 2019-07-11 DIAGNOSIS — J45909 Unspecified asthma, uncomplicated: Secondary | ICD-10-CM | POA: Diagnosis not present

## 2019-07-23 DIAGNOSIS — J441 Chronic obstructive pulmonary disease with (acute) exacerbation: Secondary | ICD-10-CM

## 2019-07-26 DIAGNOSIS — L03031 Cellulitis of right toe: Secondary | ICD-10-CM | POA: Diagnosis not present

## 2019-08-06 DIAGNOSIS — L97511 Non-pressure chronic ulcer of other part of right foot limited to breakdown of skin: Secondary | ICD-10-CM | POA: Diagnosis not present

## 2019-08-20 DIAGNOSIS — J439 Emphysema, unspecified: Secondary | ICD-10-CM | POA: Diagnosis not present

## 2019-08-20 DIAGNOSIS — I5032 Chronic diastolic (congestive) heart failure: Secondary | ICD-10-CM | POA: Diagnosis not present

## 2019-08-20 DIAGNOSIS — I739 Peripheral vascular disease, unspecified: Secondary | ICD-10-CM | POA: Diagnosis not present

## 2019-08-20 DIAGNOSIS — I8291 Chronic embolism and thrombosis of unspecified vein: Secondary | ICD-10-CM | POA: Diagnosis not present

## 2019-08-20 DIAGNOSIS — J9611 Chronic respiratory failure with hypoxia: Secondary | ICD-10-CM

## 2019-08-24 DIAGNOSIS — F419 Anxiety disorder, unspecified: Secondary | ICD-10-CM | POA: Diagnosis not present

## 2019-08-28 DIAGNOSIS — R091 Pleurisy: Secondary | ICD-10-CM | POA: Diagnosis not present

## 2019-09-05 DIAGNOSIS — B351 Tinea unguium: Secondary | ICD-10-CM | POA: Diagnosis not present

## 2019-09-07 DIAGNOSIS — M79671 Pain in right foot: Secondary | ICD-10-CM | POA: Diagnosis not present

## 2019-09-18 DIAGNOSIS — L03031 Cellulitis of right toe: Secondary | ICD-10-CM | POA: Diagnosis not present

## 2019-10-04 DIAGNOSIS — J441 Chronic obstructive pulmonary disease with (acute) exacerbation: Secondary | ICD-10-CM | POA: Diagnosis not present

## 2019-10-17 DIAGNOSIS — Z89612 Acquired absence of left leg above knee: Secondary | ICD-10-CM

## 2019-10-17 DIAGNOSIS — G2581 Restless legs syndrome: Secondary | ICD-10-CM

## 2019-10-17 DIAGNOSIS — K219 Gastro-esophageal reflux disease without esophagitis: Secondary | ICD-10-CM | POA: Diagnosis not present

## 2019-10-17 DIAGNOSIS — I82891 Chronic embolism and thrombosis of other specified veins: Secondary | ICD-10-CM

## 2019-10-17 DIAGNOSIS — I739 Peripheral vascular disease, unspecified: Secondary | ICD-10-CM

## 2019-10-17 DIAGNOSIS — J449 Chronic obstructive pulmonary disease, unspecified: Secondary | ICD-10-CM

## 2019-10-17 DIAGNOSIS — I5030 Unspecified diastolic (congestive) heart failure: Secondary | ICD-10-CM

## 2019-10-17 DIAGNOSIS — I10 Essential (primary) hypertension: Secondary | ICD-10-CM

## 2019-10-31 DIAGNOSIS — R040 Epistaxis: Secondary | ICD-10-CM | POA: Diagnosis not present

## 2019-10-31 DIAGNOSIS — R0982 Postnasal drip: Secondary | ICD-10-CM | POA: Diagnosis not present

## 2019-11-01 DIAGNOSIS — J441 Chronic obstructive pulmonary disease with (acute) exacerbation: Secondary | ICD-10-CM

## 2019-11-16 DIAGNOSIS — B351 Tinea unguium: Secondary | ICD-10-CM

## 2019-12-03 DIAGNOSIS — F39 Unspecified mood [affective] disorder: Secondary | ICD-10-CM

## 2019-12-03 DIAGNOSIS — R0989 Other specified symptoms and signs involving the circulatory and respiratory systems: Secondary | ICD-10-CM

## 2019-12-10 DIAGNOSIS — J449 Chronic obstructive pulmonary disease, unspecified: Secondary | ICD-10-CM

## 2019-12-10 DIAGNOSIS — J324 Chronic pansinusitis: Secondary | ICD-10-CM

## 2019-12-14 ENCOUNTER — Encounter: Payer: Self-pay | Admitting: Emergency Medicine

## 2019-12-14 ENCOUNTER — Emergency Department: Payer: Medicare Other

## 2019-12-14 ENCOUNTER — Encounter: Payer: Self-pay | Admitting: Internal Medicine

## 2019-12-14 ENCOUNTER — Ambulatory Visit (INDEPENDENT_AMBULATORY_CARE_PROVIDER_SITE_OTHER): Payer: Medicare Other | Admitting: Internal Medicine

## 2019-12-14 ENCOUNTER — Other Ambulatory Visit: Payer: Self-pay

## 2019-12-14 ENCOUNTER — Inpatient Hospital Stay
Admission: EM | Admit: 2019-12-14 | Discharge: 2020-01-12 | DRG: 208 | Disposition: E | Payer: Medicare Other | Attending: Internal Medicine | Admitting: Internal Medicine

## 2019-12-14 VITALS — BP 128/76 | HR 85 | Temp 98.0°F

## 2019-12-14 DIAGNOSIS — R0600 Dyspnea, unspecified: Secondary | ICD-10-CM

## 2019-12-14 DIAGNOSIS — I2510 Atherosclerotic heart disease of native coronary artery without angina pectoris: Secondary | ICD-10-CM | POA: Diagnosis present

## 2019-12-14 DIAGNOSIS — Z87891 Personal history of nicotine dependence: Secondary | ICD-10-CM

## 2019-12-14 DIAGNOSIS — Z9621 Cochlear implant status: Secondary | ICD-10-CM | POA: Diagnosis present

## 2019-12-14 DIAGNOSIS — E1151 Type 2 diabetes mellitus with diabetic peripheral angiopathy without gangrene: Secondary | ICD-10-CM | POA: Diagnosis present

## 2019-12-14 DIAGNOSIS — Z01818 Encounter for other preprocedural examination: Secondary | ICD-10-CM

## 2019-12-14 DIAGNOSIS — J449 Chronic obstructive pulmonary disease, unspecified: Secondary | ICD-10-CM | POA: Diagnosis not present

## 2019-12-14 DIAGNOSIS — R0602 Shortness of breath: Secondary | ICD-10-CM

## 2019-12-14 DIAGNOSIS — J440 Chronic obstructive pulmonary disease with acute lower respiratory infection: Secondary | ICD-10-CM | POA: Diagnosis present

## 2019-12-14 DIAGNOSIS — Z20822 Contact with and (suspected) exposure to covid-19: Secondary | ICD-10-CM | POA: Diagnosis present

## 2019-12-14 DIAGNOSIS — R54 Age-related physical debility: Secondary | ICD-10-CM | POA: Diagnosis present

## 2019-12-14 DIAGNOSIS — J9811 Atelectasis: Secondary | ICD-10-CM | POA: Diagnosis present

## 2019-12-14 DIAGNOSIS — J9611 Chronic respiratory failure with hypoxia: Secondary | ICD-10-CM | POA: Diagnosis not present

## 2019-12-14 DIAGNOSIS — E870 Hyperosmolality and hypernatremia: Secondary | ICD-10-CM | POA: Diagnosis not present

## 2019-12-14 DIAGNOSIS — R0781 Pleurodynia: Secondary | ICD-10-CM | POA: Diagnosis not present

## 2019-12-14 DIAGNOSIS — T17208A Unspecified foreign body in pharynx causing other injury, initial encounter: Secondary | ICD-10-CM | POA: Diagnosis not present

## 2019-12-14 DIAGNOSIS — G8929 Other chronic pain: Secondary | ICD-10-CM | POA: Diagnosis present

## 2019-12-14 DIAGNOSIS — I959 Hypotension, unspecified: Secondary | ICD-10-CM | POA: Diagnosis not present

## 2019-12-14 DIAGNOSIS — Z7189 Other specified counseling: Secondary | ICD-10-CM | POA: Diagnosis not present

## 2019-12-14 DIAGNOSIS — E1142 Type 2 diabetes mellitus with diabetic polyneuropathy: Secondary | ICD-10-CM | POA: Diagnosis present

## 2019-12-14 DIAGNOSIS — J441 Chronic obstructive pulmonary disease with (acute) exacerbation: Secondary | ICD-10-CM

## 2019-12-14 DIAGNOSIS — I132 Hypertensive heart and chronic kidney disease with heart failure and with stage 5 chronic kidney disease, or end stage renal disease: Secondary | ICD-10-CM | POA: Diagnosis present

## 2019-12-14 DIAGNOSIS — Z95828 Presence of other vascular implants and grafts: Secondary | ICD-10-CM

## 2019-12-14 DIAGNOSIS — J189 Pneumonia, unspecified organism: Secondary | ICD-10-CM | POA: Diagnosis present

## 2019-12-14 DIAGNOSIS — J9622 Acute and chronic respiratory failure with hypercapnia: Secondary | ICD-10-CM | POA: Diagnosis present

## 2019-12-14 DIAGNOSIS — Z9109 Other allergy status, other than to drugs and biological substances: Secondary | ICD-10-CM

## 2019-12-14 DIAGNOSIS — E871 Hypo-osmolality and hyponatremia: Secondary | ICD-10-CM | POA: Diagnosis present

## 2019-12-14 DIAGNOSIS — F32A Depression, unspecified: Secondary | ICD-10-CM | POA: Diagnosis present

## 2019-12-14 DIAGNOSIS — Z66 Do not resuscitate: Secondary | ICD-10-CM | POA: Diagnosis not present

## 2019-12-14 DIAGNOSIS — Z7901 Long term (current) use of anticoagulants: Secondary | ICD-10-CM

## 2019-12-14 DIAGNOSIS — J9621 Acute and chronic respiratory failure with hypoxia: Secondary | ICD-10-CM | POA: Diagnosis present

## 2019-12-14 DIAGNOSIS — Z9981 Dependence on supplemental oxygen: Secondary | ICD-10-CM

## 2019-12-14 DIAGNOSIS — M40209 Unspecified kyphosis, site unspecified: Secondary | ICD-10-CM | POA: Diagnosis present

## 2019-12-14 DIAGNOSIS — E1122 Type 2 diabetes mellitus with diabetic chronic kidney disease: Secondary | ICD-10-CM | POA: Diagnosis present

## 2019-12-14 DIAGNOSIS — M810 Age-related osteoporosis without current pathological fracture: Secondary | ICD-10-CM | POA: Diagnosis present

## 2019-12-14 DIAGNOSIS — I10 Essential (primary) hypertension: Secondary | ICD-10-CM

## 2019-12-14 DIAGNOSIS — J690 Pneumonitis due to inhalation of food and vomit: Secondary | ICD-10-CM | POA: Diagnosis not present

## 2019-12-14 DIAGNOSIS — K222 Esophageal obstruction: Secondary | ICD-10-CM | POA: Diagnosis not present

## 2019-12-14 DIAGNOSIS — D72829 Elevated white blood cell count, unspecified: Secondary | ICD-10-CM | POA: Diagnosis not present

## 2019-12-14 DIAGNOSIS — K219 Gastro-esophageal reflux disease without esophagitis: Secondary | ICD-10-CM | POA: Diagnosis present

## 2019-12-14 DIAGNOSIS — J9690 Respiratory failure, unspecified, unspecified whether with hypoxia or hypercapnia: Secondary | ICD-10-CM

## 2019-12-14 DIAGNOSIS — G2581 Restless legs syndrome: Secondary | ICD-10-CM | POA: Diagnosis present

## 2019-12-14 DIAGNOSIS — R0902 Hypoxemia: Secondary | ICD-10-CM

## 2019-12-14 DIAGNOSIS — K220 Achalasia of cardia: Secondary | ICD-10-CM | POA: Diagnosis not present

## 2019-12-14 DIAGNOSIS — I5031 Acute diastolic (congestive) heart failure: Secondary | ICD-10-CM | POA: Diagnosis not present

## 2019-12-14 DIAGNOSIS — I5042 Chronic combined systolic (congestive) and diastolic (congestive) heart failure: Secondary | ICD-10-CM | POA: Diagnosis present

## 2019-12-14 DIAGNOSIS — Z22322 Carrier or suspected carrier of Methicillin resistant Staphylococcus aureus: Secondary | ICD-10-CM

## 2019-12-14 DIAGNOSIS — Z515 Encounter for palliative care: Secondary | ICD-10-CM | POA: Diagnosis not present

## 2019-12-14 DIAGNOSIS — R1310 Dysphagia, unspecified: Secondary | ICD-10-CM

## 2019-12-14 DIAGNOSIS — Z6827 Body mass index (BMI) 27.0-27.9, adult: Secondary | ICD-10-CM

## 2019-12-14 DIAGNOSIS — H9190 Unspecified hearing loss, unspecified ear: Secondary | ICD-10-CM | POA: Diagnosis present

## 2019-12-14 DIAGNOSIS — I5030 Unspecified diastolic (congestive) heart failure: Secondary | ICD-10-CM

## 2019-12-14 DIAGNOSIS — K449 Diaphragmatic hernia without obstruction or gangrene: Secondary | ICD-10-CM | POA: Diagnosis present

## 2019-12-14 DIAGNOSIS — Z7982 Long term (current) use of aspirin: Secondary | ICD-10-CM

## 2019-12-14 DIAGNOSIS — J9600 Acute respiratory failure, unspecified whether with hypoxia or hypercapnia: Secondary | ICD-10-CM | POA: Diagnosis not present

## 2019-12-14 DIAGNOSIS — E785 Hyperlipidemia, unspecified: Secondary | ICD-10-CM | POA: Diagnosis present

## 2019-12-14 DIAGNOSIS — K2289 Other specified disease of esophagus: Secondary | ICD-10-CM | POA: Diagnosis not present

## 2019-12-14 DIAGNOSIS — J9601 Acute respiratory failure with hypoxia: Secondary | ICD-10-CM | POA: Diagnosis not present

## 2019-12-14 DIAGNOSIS — N185 Chronic kidney disease, stage 5: Secondary | ICD-10-CM | POA: Diagnosis present

## 2019-12-14 DIAGNOSIS — Z881 Allergy status to other antibiotic agents status: Secondary | ICD-10-CM

## 2019-12-14 DIAGNOSIS — I5032 Chronic diastolic (congestive) heart failure: Secondary | ICD-10-CM | POA: Diagnosis not present

## 2019-12-14 DIAGNOSIS — Z86718 Personal history of other venous thrombosis and embolism: Secondary | ICD-10-CM

## 2019-12-14 DIAGNOSIS — Z79899 Other long term (current) drug therapy: Secondary | ICD-10-CM

## 2019-12-14 DIAGNOSIS — D649 Anemia, unspecified: Secondary | ICD-10-CM | POA: Diagnosis present

## 2019-12-14 DIAGNOSIS — Z90710 Acquired absence of both cervix and uterus: Secondary | ICD-10-CM

## 2019-12-14 DIAGNOSIS — Z8249 Family history of ischemic heart disease and other diseases of the circulatory system: Secondary | ICD-10-CM

## 2019-12-14 DIAGNOSIS — Z89612 Acquired absence of left leg above knee: Secondary | ICD-10-CM

## 2019-12-14 DIAGNOSIS — R627 Adult failure to thrive: Secondary | ICD-10-CM | POA: Diagnosis present

## 2019-12-14 DIAGNOSIS — Z885 Allergy status to narcotic agent status: Secondary | ICD-10-CM

## 2019-12-14 DIAGNOSIS — Z9049 Acquired absence of other specified parts of digestive tract: Secondary | ICD-10-CM

## 2019-12-14 DIAGNOSIS — F419 Anxiety disorder, unspecified: Secondary | ICD-10-CM | POA: Diagnosis present

## 2019-12-14 DIAGNOSIS — M7918 Myalgia, other site: Secondary | ICD-10-CM | POA: Diagnosis present

## 2019-12-14 LAB — BRAIN NATRIURETIC PEPTIDE: B Natriuretic Peptide: 97.5 pg/mL (ref 0.0–100.0)

## 2019-12-14 LAB — CBC WITH DIFFERENTIAL/PLATELET
Abs Immature Granulocytes: 0.19 10*3/uL — ABNORMAL HIGH (ref 0.00–0.07)
Basophils Absolute: 0 10*3/uL (ref 0.0–0.1)
Basophils Relative: 0 %
Eosinophils Absolute: 0 10*3/uL (ref 0.0–0.5)
Eosinophils Relative: 0 %
HCT: 39.1 % (ref 36.0–46.0)
Hemoglobin: 12.2 g/dL (ref 12.0–15.0)
Immature Granulocytes: 1 %
Lymphocytes Relative: 9 %
Lymphs Abs: 1.5 10*3/uL (ref 0.7–4.0)
MCH: 26.8 pg (ref 26.0–34.0)
MCHC: 31.2 g/dL (ref 30.0–36.0)
MCV: 85.9 fL (ref 80.0–100.0)
Monocytes Absolute: 0.7 10*3/uL (ref 0.1–1.0)
Monocytes Relative: 4 %
Neutro Abs: 13.8 10*3/uL — ABNORMAL HIGH (ref 1.7–7.7)
Neutrophils Relative %: 86 %
Platelets: 387 10*3/uL (ref 150–400)
RBC: 4.55 MIL/uL (ref 3.87–5.11)
RDW: 17.3 % — ABNORMAL HIGH (ref 11.5–15.5)
WBC: 16.2 10*3/uL — ABNORMAL HIGH (ref 4.0–10.5)
nRBC: 0 % (ref 0.0–0.2)

## 2019-12-14 LAB — COMPREHENSIVE METABOLIC PANEL
ALT: 18 U/L (ref 0–44)
AST: 24 U/L (ref 15–41)
Albumin: 3.4 g/dL — ABNORMAL LOW (ref 3.5–5.0)
Alkaline Phosphatase: 86 U/L (ref 38–126)
Anion gap: 15 (ref 5–15)
BUN: 26 mg/dL — ABNORMAL HIGH (ref 8–23)
CO2: 28 mmol/L (ref 22–32)
Calcium: 9.9 mg/dL (ref 8.9–10.3)
Chloride: 90 mmol/L — ABNORMAL LOW (ref 98–111)
Creatinine, Ser: 1 mg/dL (ref 0.44–1.00)
GFR, Estimated: 54 mL/min — ABNORMAL LOW (ref >60)
Glucose, Bld: 149 mg/dL — ABNORMAL HIGH (ref 70–99)
Potassium: 3.2 mmol/L — ABNORMAL LOW (ref 3.5–5.1)
Sodium: 133 mmol/L — ABNORMAL LOW (ref 135–145)
Total Bilirubin: 0.7 mg/dL (ref 0.3–1.2)
Total Protein: 7.3 g/dL (ref 6.5–8.1)

## 2019-12-14 LAB — RESP PANEL BY RT-PCR (FLU A&B, COVID) ARPGX2
Influenza A by PCR: NEGATIVE
Influenza B by PCR: NEGATIVE
SARS Coronavirus 2 by RT PCR: NEGATIVE

## 2019-12-14 LAB — TROPONIN I (HIGH SENSITIVITY): Troponin I (High Sensitivity): 21 ng/L — ABNORMAL HIGH (ref <18)

## 2019-12-14 MED ORDER — LACTATED RINGERS IV SOLN: INTRAVENOUS | Status: AC

## 2019-12-14 MED ORDER — ASPIRIN EC 81 MG PO TBEC
81.0000 mg | DELAYED_RELEASE_TABLET | Freq: Every day | ORAL | Status: DC
  Administered 2019-12-15 – 2019-12-20 (×6): 81 mg via ORAL
  Filled 2019-12-14 (×7): qty 1

## 2019-12-14 MED ORDER — PRAMIPEXOLE DIHYDROCHLORIDE 0.25 MG PO TABS
0.2500 mg | ORAL_TABLET | Freq: Every day | ORAL | Status: DC
  Administered 2019-12-15 – 2019-12-20 (×7): 0.25 mg via ORAL
  Filled 2019-12-14 (×15): qty 1

## 2019-12-14 MED ORDER — METOLAZONE 2.5 MG PO TABS
2.5000 mg | ORAL_TABLET | ORAL | Status: DC
  Filled 2019-12-14: qty 1

## 2019-12-14 MED ORDER — DM-GUAIFENESIN ER 30-600 MG PO TB12
1.0000 | ORAL_TABLET | Freq: Two times a day (BID) | ORAL | Status: DC
  Administered 2019-12-14 – 2019-12-18 (×8): 1 via ORAL
  Filled 2019-12-14 (×8): qty 1

## 2019-12-14 MED ORDER — SPIRONOLACTONE 25 MG PO TABS
25.0000 mg | ORAL_TABLET | Freq: Every day | ORAL | Status: DC
  Administered 2019-12-15 – 2019-12-20 (×6): 25 mg via ORAL
  Filled 2019-12-14 (×7): qty 1

## 2019-12-14 MED ORDER — APIXABAN 2.5 MG PO TABS
2.5000 mg | ORAL_TABLET | Freq: Two times a day (BID) | ORAL | Status: DC
  Administered 2019-12-15 – 2019-12-20 (×13): 2.5 mg via ORAL
  Filled 2019-12-14 (×15): qty 1

## 2019-12-14 MED ORDER — MONTELUKAST SODIUM 10 MG PO TABS
10.0000 mg | ORAL_TABLET | Freq: Every day | ORAL | Status: DC
  Administered 2019-12-15 – 2019-12-20 (×7): 10 mg via ORAL
  Filled 2019-12-14 (×9): qty 1

## 2019-12-14 MED ORDER — SODIUM CHLORIDE 0.9 % IV SOLN: 500.0000 mg | Freq: Once | INTRAVENOUS | Status: DC

## 2019-12-14 MED ORDER — KETOROLAC TROMETHAMINE 15 MG/ML IJ SOLN
15.0000 mg | Freq: Four times a day (QID) | INTRAMUSCULAR | Status: AC | PRN
  Administered 2019-12-15 (×2): 15 mg via INTRAVENOUS
  Filled 2019-12-14 (×4): qty 1

## 2019-12-14 MED ORDER — IPRATROPIUM-ALBUTEROL 0.5-2.5 (3) MG/3ML IN SOLN
3.0000 mL | Freq: Once | RESPIRATORY_TRACT | Status: AC
  Administered 2019-12-14: 3 mL via RESPIRATORY_TRACT
  Filled 2019-12-14: qty 6

## 2019-12-14 MED ORDER — LORAZEPAM 0.5 MG PO TABS
0.5000 mg | ORAL_TABLET | ORAL | Status: DC | PRN
  Administered 2019-12-16 – 2019-12-20 (×5): 0.5 mg via ORAL
  Filled 2019-12-14 (×5): qty 1

## 2019-12-14 MED ORDER — IOHEXOL 350 MG/ML SOLN
75.0000 mL | Freq: Once | INTRAVENOUS | Status: AC | PRN
  Administered 2019-12-14: 75 mL via INTRAVENOUS

## 2019-12-14 MED ORDER — SIMVASTATIN 20 MG PO TABS
20.0000 mg | ORAL_TABLET | Freq: Every day | ORAL | Status: DC
  Administered 2019-12-14 – 2019-12-20 (×7): 20 mg via ORAL
  Filled 2019-12-14 (×4): qty 1
  Filled 2019-12-14: qty 2
  Filled 2019-12-14 (×2): qty 1

## 2019-12-14 MED ORDER — SODIUM CHLORIDE 0.9 % IV SOLN
500.0000 mg | INTRAVENOUS | Status: AC
  Administered 2019-12-14 – 2019-12-18 (×5): 500 mg via INTRAVENOUS
  Filled 2019-12-14 (×5): qty 500

## 2019-12-14 MED ORDER — SODIUM CHLORIDE 0.9 % IV SOLN
1.0000 g | Freq: Once | INTRAVENOUS | Status: DC
  Filled 2019-12-14: qty 10

## 2019-12-14 MED ORDER — PREDNISONE 20 MG PO TABS
40.0000 mg | ORAL_TABLET | Freq: Every day | ORAL | Status: DC
  Administered 2019-12-15 – 2019-12-16 (×2): 40 mg via ORAL
  Filled 2019-12-14 (×2): qty 2

## 2019-12-14 MED ORDER — KETOROLAC TROMETHAMINE 15 MG/ML IJ SOLN
15.0000 mg | Freq: Once | INTRAMUSCULAR | Status: AC
  Administered 2019-12-14: 15 mg via INTRAVENOUS
  Filled 2019-12-14: qty 1

## 2019-12-14 MED ORDER — IPRATROPIUM-ALBUTEROL 0.5-2.5 (3) MG/3ML IN SOLN
3.0000 mL | Freq: Three times a day (TID) | RESPIRATORY_TRACT | Status: DC
  Administered 2019-12-14 – 2019-12-17 (×8): 3 mL via RESPIRATORY_TRACT
  Filled 2019-12-14 (×8): qty 3

## 2019-12-14 MED ORDER — FUROSEMIDE 20 MG PO TABS
40.0000 mg | ORAL_TABLET | Freq: Every day | ORAL | Status: DC
  Administered 2019-12-15 – 2019-12-20 (×6): 40 mg via ORAL
  Filled 2019-12-14 (×6): qty 1

## 2019-12-14 MED ORDER — ADULT MULTIVITAMIN W/MINERALS CH
ORAL_TABLET | Freq: Every day | ORAL | Status: DC
  Administered 2019-12-15 – 2019-12-20 (×6): 1 via ORAL
  Filled 2019-12-14 (×7): qty 1

## 2019-12-14 MED ORDER — SODIUM CHLORIDE 0.9 % IV SOLN
2.0000 g | INTRAVENOUS | Status: AC
  Administered 2019-12-14 – 2019-12-18 (×5): 2 g via INTRAVENOUS
  Filled 2019-12-14: qty 20
  Filled 2019-12-14 (×3): qty 2
  Filled 2019-12-14: qty 20

## 2019-12-14 MED ORDER — ALBUTEROL SULFATE HFA 108 (90 BASE) MCG/ACT IN AERS
2.0000 | INHALATION_SPRAY | Freq: Four times a day (QID) | RESPIRATORY_TRACT | Status: DC | PRN
  Administered 2019-12-20: 22:00:00 2 via RESPIRATORY_TRACT
  Filled 2019-12-14 (×2): qty 6.7

## 2019-12-14 MED ORDER — CALCIUM CARBONATE-VITAMIN D 500-200 MG-UNIT PO TABS
1.0000 | ORAL_TABLET | Freq: Every day | ORAL | Status: DC
  Administered 2019-12-15 – 2019-12-20 (×6): 1 via ORAL
  Filled 2019-12-14 (×7): qty 1

## 2019-12-14 MED ORDER — ESCITALOPRAM OXALATE 10 MG PO TABS
10.0000 mg | ORAL_TABLET | Freq: Every day | ORAL | Status: DC
  Administered 2019-12-15 – 2019-12-20 (×6): 10 mg via ORAL
  Filled 2019-12-14 (×9): qty 1

## 2019-12-14 MED ORDER — LIDOCAINE 5 % EX PTCH
2.0000 | MEDICATED_PATCH | CUTANEOUS | Status: AC
  Administered 2019-12-14 – 2019-12-18 (×3): 2 via TRANSDERMAL
  Filled 2019-12-14 (×5): qty 2

## 2019-12-14 MED ORDER — IPRATROPIUM-ALBUTEROL 0.5-2.5 (3) MG/3ML IN SOLN
3.0000 mL | Freq: Once | RESPIRATORY_TRACT | Status: AC
  Administered 2019-12-14: 3 mL via RESPIRATORY_TRACT

## 2019-12-14 MED ORDER — METHYLPREDNISOLONE SODIUM SUCC 125 MG IJ SOLR
60.0000 mg | Freq: Two times a day (BID) | INTRAMUSCULAR | Status: AC
  Administered 2019-12-14 – 2019-12-15 (×2): 60 mg via INTRAVENOUS
  Filled 2019-12-14 (×2): qty 2

## 2019-12-14 NOTE — ED Triage Notes (Signed)
Pt to ED from PCP office for shortness of breath, wheezing, and back pain. Per Caregiver pt has been feeling bad since Thanksgiving. Pt has been on antibiotics and prednisone but she is not getting any better. Pt is chronically on 2 liters of oxygen.

## 2019-12-14 NOTE — ED Provider Notes (Signed)
[facility] Emergency Department Provider Note  Time seen: 1:22 PM  I have reviewed the triage vital signs and the nursing notes.   HISTORY  Chief Complaint Shortness of Breath   HPI Natasha [name] is a 86 y.o. female with a past medical history of anemia, COPD on 3 L of oxygen 24/7, cardiomegaly, hypertension, CKD, presents to the emergency department for difficulty breathing.  According to the patient for the past several weeks she has been feeling short of breath and coughing.  Caregiver states she has been having a wet cough.  Has been on antibiotics and prednisone but has not improved.  Patient currently satting in the upper 70s on 3 L.  Placed on 6 L and satting around 90 to 95%.  Patient appears to have a wet sounding cough in the emergency department.  No known fever.  Does state several weeks of chest pain/tightness.  Past Medical History:  Diagnosis Date  . Anemia   . Arthritis   . Asthma   . Bilateral pneumonia may 2017  . Bronchitis   . Cardiomegaly   . Chronic respiratory failure with hypoxia ([place])   . Cochlear implant in place    bilateral, [name], 04/26/2007   . Gallstones 2015  . GERD (gastroesophageal reflux disease)   . H/O blood clots 2014  . History of home oxygen therapy    at night  . Hypertension   . Hypokalemia   . Peripheral vascular disease ([place])    with arterial clots- on xarelto  . Polyneuropathy   . Renal disorder    Chronic Kidney Disease, Stage 5  . Restless leg syndrome     Patient Active Problem List   Diagnosis Date Noted  . Arterial occlusion 01/14/2018  . Sacral fracture ([place]) 11/15/2016  . HTN (hypertension) 11/15/2016  . GERD (gastroesophageal reflux disease) 11/15/2016  . RLS (restless legs syndrome) 11/15/2016  . Asthma 11/15/2016  . Back pain 10/29/2016  . CAP (community acquired pneumonia) 03/27/2016  . Cellulitis 03/27/2016  . Hypokalemia 03/27/2016  . Acute respiratory failure ([place])  03/27/2016  . Sepsis ([place]) 08/24/2015  . HCAP (healthcare-associated pneumonia) 06/27/2015  . Pneumonia 06/07/2015  . Hypotension 03/11/2015  . Lower extremity atheroembolism ([place]) 02/27/2015  . Ischemic leg 02/27/2015  . Calculus of gallbladder with other cholecystitis, without mention of obstruction 04/20/2013    Past Surgical History:  Procedure Laterality Date  . ABDOMINAL HYSTERECTOMY  1960  . APPENDECTOMY  1946  . BREAST BIOPSY Bilateral    negative  . CHOLECYSTECTOMY  04-24-13  . COCHLEAR IMPLANT  2009  . COLONOSCOPY  2015   Dr. Rayann Heman   . EYE SURGERY  2008,2009   cataract  . KYPHOPLASTY N/A 08/19/2015   Procedure: KYPHOPLASTY;  Surgeon: Hessie Knows, MD;  Location: [facility] ORS;  Service: Orthopedics;  Laterality: N/A;  . PERIPHERAL VASCULAR CATHETERIZATION N/A 02/27/2015   Procedure: Abdominal Aortogram w/Lower Extremity;  Surgeon: Algernon Huxley, MD;  Location: [place] [facility];  Service: Cardiovascular;  Laterality: N/A;  . PERIPHERAL VASCULAR CATHETERIZATION  02/27/2015   Procedure: Lower Extremity Intervention;  Surgeon: Algernon Huxley, MD;  Location: [place] [facility];  Service: Cardiovascular;;  . PERIPHERAL VASCULAR CATHETERIZATION Left 02/28/2015   Procedure: Lower Extremity Angiography;  Surgeon: Algernon Huxley, MD;  Location: [place] [facility];  Service: Cardiovascular;  Laterality: Left;  . PERIPHERAL VASCULAR CATHETERIZATION  02/28/2015   Procedure: Lower Extremity Intervention;  Surgeon: Algernon Huxley, MD;  Location: [place] [facility];  Service: Cardiovascular;;  . PERIPHERAL VASCULAR CATHETERIZATION Left 05/22/2015   Procedure: Lower Extremity Angiography;  Surgeon: Algernon Huxley, MD;  Location: [place] [facility];  Service: Cardiovascular;  Laterality: Left;  . PERIPHERAL VASCULAR CATHETERIZATION  05/22/2015   Procedure: Lower Extremity Intervention;  Surgeon: Algernon Huxley, MD;  Location: [place] [facility];  Service: Cardiovascular;;  . PERIPHERAL VASCULAR  CATHETERIZATION Left 05/23/2015   Procedure: Lower Extremity Angiography;  Surgeon: Algernon Huxley, MD;  Location: [place] [facility];  Service: Cardiovascular;  Laterality: Left;  . PERIPHERAL VASCULAR CATHETERIZATION  05/23/2015   Procedure: Lower Extremity Intervention;  Surgeon: Algernon Huxley, MD;  Location: [place] [facility];  Service: Cardiovascular;;  . stent placement  2006-2014   multiple stent placements in legs    Prior to Admission medications   Medication Sig Start Date End Date Taking? Authorizing Provider  albuterol (PROVENTIL) (2.5 MG/3ML) 0.083% nebulizer solution Take 2.5 mg by nebulization every 6 (six) hours as needed for wheezing or shortness of breath.    [provider]  albuterol (VENTOLIN HFA) 108 (90 Base) MCG/ACT inhaler Inhale into the lungs every 6 (six) hours as needed for wheezing or shortness of breath.    [provider]  amoxicillin-clavulanate (AUGMENTIN) 875-125 MG tablet Take 1 tablet by mouth 2 (two) times daily.    [provider]  apixaban (ELIQUIS) 2.5 MG TABS tablet Take by mouth 2 (two) times daily.    [provider]  aspirin EC 81 MG tablet Take 81 mg by mouth daily. Swallow whole.    [provider]  benzocaine (HURRICAINE) 20 % GEL Use as directed in the mouth or throat.    [provider]  Calcium Carbonate-Vit D-Min (CALTRATE 600+D PLUS MINERALS) 600-800 MG-UNIT TABS Take 1 tablet by mouth daily.    [provider]  escitalopram (LEXAPRO) 10 MG tablet Take 10 mg by mouth daily.    [provider]  furosemide (LASIX) 40 MG tablet Take 40 mg by mouth daily.    [provider]  loratadine (CLARITIN) 10 MG tablet Take 10 mg by mouth daily.    [provider]  LORazepam (ATIVAN) 0.5 MG tablet Take 0.5 mg by mouth every 4 (four) hours as needed for anxiety.    [provider]  magnesium oxide (MAG-OX) 400 MG tablet Take 400 mg by mouth daily.    [provider]  metolazone (ZAROXOLYN) 2.5 MG tablet Take 2.5 mg by mouth. M,W,F    [provider]  montelukast (SINGULAIR) 10 MG tablet Take 10 mg by mouth at bedtime.    [provider]  Multiple Vitamins-Minerals (DECUBI-VITE PO) Take 1 capsule by mouth daily.    [provider]  pramipexole (MIRAPEX) 0.25 MG tablet Take 0.25 mg by mouth daily.    [provider]  sennosides-docusate sodium (SENOKOT-S) 8.6-50 MG tablet Take 1 tablet by mouth in the morning and at bedtime.    [provider]  silver sulfADIAZINE (SILVADENE) 1 % cream Apply 1 application topically daily.    [provider]  simvastatin (ZOCOR) 20 MG tablet Take 20 mg by mouth daily.    [provider]  sodium chloride (MURO 128) 2 % ophthalmic solution 1 drop.    [provider]  spironolactone (ALDACTONE) 25 MG tablet Take 25 mg by mouth daily.    [provider]    Allergies  Allergen Reactions  . Codeine  Diarrhea    Patient can tolerate small amounts of OXYCODONE per patient info sheet  . Hydrocodone Nausea And Vomiting    Patient can tolerate small amounts of OXYCODONE per patient info sheet  . Levofloxacin Other (See Comments)    Muscle cramps  . Tramadol Nausea And Vomiting    Family History  Problem Relation Age of Onset  . Heart disease Mother   . Heart disease Father   . Cancer Sister        lung  . Breast cancer Daughter     Social History Social History   Tobacco Use  . Smoking status: Former Smoker    Packs/day: 1.00    Years: 25.00    Pack years: 25.00    Types: Cigarettes    Quit date: 1982    Years since quitting: 39.9  . Smokeless tobacco: Never Used  Vaping Use  . Vaping Use: Never used  Substance Use Topics  . Alcohol use: No  . Drug use: No    Review of Systems Constitutional: Negative for fever. Cardiovascular: Several weeks of chest pain/tightness. Respiratory: Positive for shortness of breath.   Positive for cough. Gastrointestinal: Negative for abdominal pain, vomiting  Musculoskeletal: Negative for musculoskeletal complaints Neurological: Negative for headache All other ROS negative  ____________________________________________   PHYSICAL EXAM:  VITAL SIGNS: ED Triage Vitals  Enc Vitals Group     BP 12/15/2019 1033 (!) 124/47     Pulse Rate 12/29/2019 0959 87     Resp 12/25/2019 0959 18     Temp 01/10/2020 1236 98.7 F (37.1 C)     Temp Source 12/12/2019 1236 Oral     SpO2 01/05/2020 0959 92 %     Weight --      Height --      Head Circumference --      Peak Flow --      Pain Score 12/29/2019 0959 9     Pain Loc --      Pain Edu? --      Excl. in [place]? --    Constitutional: Patient awake alert, no acute distress. Eyes: Normal exam ENT      Head: Normocephalic and atraumatic.      Mouth/Throat: Mucous membranes are moist. Cardiovascular: Normal rate, regular rhythm. Respiratory: Mild tachypnea with bilateral rhonchi on auscultation.  Wet sounding cough. Gastrointestinal: Soft and nontender. No distention. Musculoskeletal: Nontender with normal range of motion in all extremities.  Neurologic:  Normal speech and language. No gross focal neurologic deficits  Skin:  Skin is warm, dry and intact.  Psychiatric: Mood and affect are normal.  ____________________________________________    EKG  EKG viewed and interpreted by myself shows a sinus rhythm 89 bpm with a narrow QRS, right axis deviation, largely normal intervals, no concerning ST changes.  Occasional PVC.  ____________________________________________    RADIOLOGY  Unchanged chest x-ray CTA pending  ____________________________________________   INITIAL IMPRESSION / ASSESSMENT AND PLAN / ED COURSE  Pertinent labs & imaging results that were available during my care of the patient were reviewed by me and considered in my medical decision making (see chart for details).   Patient presents to the emergency  department for difficulty breathing.  After moving to the bed patient satting in the 70s on 3 L, increased to 6 L and the patient satting around 92%.  Patient chronically on 3 L at home.  Patient does have wet sounding cough and bilateral rhonchi.  Chest x-ray read is largely unchanged.  Lab work largely unchanged from baseline besides leukocytosis however the patient is currently on prednisone.  Patient will likely require CT imaging to further evaluate for possible occult pneumonia.  I have added on troponin and a BNP.  We will treat with duo nebs while waiting for the results.  Patient agreeable to plan of care.  CTA pending, pt care signed out to oncoming physician.  Pt continues to have increased O2 requirement.   DAESHA INSCO was evaluated in Emergency Department on 12/13/2019 for the symptoms described in the history of present illness. She was evaluated in the context of the global COVID-19 pandemic, which necessitated consideration that the patient might be at risk for infection with the SARS-CoV-2 virus that causes COVID-19. Institutional protocols and algorithms that pertain to the evaluation of patients at risk for COVID-19 are in a state of rapid change based on information released by regulatory bodies including the CDC and federal and state organizations. These policies and algorithms were followed during the patient's care in the ED.  ____________________________________________   FINAL CLINICAL IMPRESSION(S) / ED DIAGNOSES  Hypoxia Dyspnea   Harvest Dark, MD 12/15/19 [phone]

## 2019-12-14 NOTE — ED Triage Notes (Signed)
Her caregiver says she has upper back pain complaint with increased shortness of breath.

## 2019-12-14 NOTE — Progress Notes (Signed)
Name: Natasha [name] MRN: [id number] DOB: [birth year]     CONSULTATION DATE: 12/13/2019    CHIEF COMPLAINT: Severe shortness of breath and pain  HISTORY OF PRESENT ILLNESS: 86 year old white female with acute shortness of breath and severe back pain Seen today for assessment for COPD exacerbation Patient with slight increased work of breathing Unable to take big deep breaths Patient is in pain Patient with wheezing and coughing and chest congestion  At this time patient with acute COPD exacerbation that needs further evaluation and assessment in the emergency room  At this time outpatient therapy will not help this patient Recommend further evaluation and may be admission to the hospital  Patient uses oxygen therapy chronically Therefore she has chronic hypoxic respiratory failure from end-stage COPD  Patient has very poor respiratory insufficiency Prognosis is guarded   PAST MEDICAL HISTORY :   has a past medical history of Anemia, Arthritis, Asthma, Bilateral pneumonia (may 2017), Bronchitis, Cardiomegaly, Chronic respiratory failure with hypoxia ([place]), Cochlear implant in place, Gallstones (2015), GERD (gastroesophageal reflux disease), H/O blood clots (2014), History of home oxygen therapy, Hypertension, Hypokalemia, Peripheral vascular disease ([place]), Polyneuropathy, Renal disorder, and Restless leg syndrome.  has a past surgical history that includes Abdominal hysterectomy (1960); Appendectomy (1946); Cochlear implant (2009); stent placement (2006-2014); Colonoscopy (2015); Cholecystectomy (04-24-13); Breast biopsy (Bilateral); Cardiac catheterization (N/A, 02/27/2015); Cardiac catheterization (02/27/2015); Cardiac catheterization (Left, 02/28/2015); Cardiac catheterization (02/28/2015); Cardiac catheterization (Left, 05/22/2015); Cardiac catheterization (05/22/2015); Cardiac catheterization (Left, 05/23/2015); Cardiac catheterization (05/23/2015); Eye surgery (0626,9485); and  Kyphoplasty (N/A, 08/19/2015). Prior to Admission medications   Medication Sig Start Date End Date Taking? Authorizing Provider  albuterol (PROVENTIL) (2.5 MG/3ML) 0.083% nebulizer solution Take 2.5 mg by nebulization every 6 (six) hours as needed for wheezing or shortness of breath.   Yes [provider]  albuterol (VENTOLIN HFA) 108 (90 Base) MCG/ACT inhaler Inhale into the lungs every 6 (six) hours as needed for wheezing or shortness of breath.   Yes [provider]  amoxicillin-clavulanate (AUGMENTIN) 875-125 MG tablet Take 1 tablet by mouth 2 (two) times daily.   Yes [provider]  apixaban (ELIQUIS) 2.5 MG TABS tablet Take by mouth 2 (two) times daily.   Yes [provider]  aspirin EC 81 MG tablet Take 81 mg by mouth daily. Swallow whole.   Yes [provider]  benzocaine (HURRICAINE) 20 % GEL Use as directed in the mouth or throat.   Yes [provider]  Calcium Carbonate-Vit D-Min (CALTRATE 600+D PLUS MINERALS) 600-800 MG-UNIT TABS Take 1 tablet by mouth daily.   Yes [provider]  escitalopram (LEXAPRO) 10 MG tablet Take 10 mg by mouth daily.   Yes [provider]  furosemide (LASIX) 40 MG tablet Take 40 mg by mouth daily.   Yes [provider]  loratadine (CLARITIN) 10 MG tablet Take 10 mg by mouth daily.   Yes [provider]  LORazepam (ATIVAN) 0.5 MG tablet Take 0.5 mg by mouth every 4 (four) hours as needed for anxiety.   Yes [provider]  magnesium oxide (MAG-OX) 400 MG tablet Take 400 mg by mouth daily.   Yes [provider]  metolazone (ZAROXOLYN) 2.5 MG tablet Take 2.5 mg by mouth. M,W,F   Yes [provider]  montelukast (SINGULAIR) 10 MG tablet Take 10 mg by mouth at bedtime.   Yes [provider]  Multiple Vitamins-Minerals (DECUBI-VITE PO) Take 1 capsule by mouth daily.   Yes [provider]  pramipexole (  MIRAPEX) 0.25 MG tablet Take 0.25  mg by mouth daily.   Yes [provider]  sennosides-docusate sodium (SENOKOT-S) 8.6-50 MG tablet Take 1 tablet by mouth in the morning and at bedtime.   Yes [provider]  silver sulfADIAZINE (SILVADENE) 1 % cream Apply 1 application topically daily.   Yes [provider]  simvastatin (ZOCOR) 20 MG tablet Take 20 mg by mouth daily.   Yes [provider]  sodium chloride (MURO 128) 2 % ophthalmic solution 1 drop.   Yes [provider]  spironolactone (ALDACTONE) 25 MG tablet Take 25 mg by mouth daily.   Yes [provider]   Allergies  Allergen Reactions  . Codeine Diarrhea    Patient can tolerate small amounts of OXYCODONE per patient info sheet  . Hydrocodone Nausea And Vomiting    Patient can tolerate small amounts of OXYCODONE per patient info sheet  . Levofloxacin Other (See Comments)    Muscle cramps  . Tramadol Nausea And Vomiting    FAMILY HISTORY:  family history includes Breast cancer in her daughter; Cancer in her sister; Heart disease in her father and mother. SOCIAL HISTORY:  reports that she quit smoking about 39 years ago. Her smoking use included cigarettes. She has a 25.00 pack-year smoking history. She has never used smokeless tobacco. She reports that she does not drink alcohol and does not use drugs.    Review of Systems:  Gen:  Denies  fever, sweats, chills weigh loss  HEENT: Denies blurred vision, double vision, ear pain, eye pain, hearing loss, nose bleeds, sore throat Cardiac:  No dizziness, chest pain or heaviness, chest tightness,edema, No JVD Resp:   + Cough + chest congestion + wheezing Gi: Denies swallowing difficulty, stomach pain, nausea or vomiting, diarrhea, constipation, bowel incontinence Gu:  Denies bladder incontinence, burning urine Ext:   Denies Joint pain, stiffness or swelling Skin: Denies  skin rash, easy bruising or bleeding or hives Endoc:  Denies polyuria, polydipsia , polyphagia or  weight change Psych:   Denies depression, insomnia or hallucinations  Other:  All other systems negative     BP 128/76 (BP Location: Left Arm, Cuff Size: Normal)   Pulse 85   Temp 98 F (36.7 C) (Temporal)   SpO2 91%       SpO2: 91 % O2 Device: Nasal cannula O2 Flow Rate (L/min): 2 L/min O2 Type: Pulse O2    Physical Examination:   GENERAL:NAD, no fevers, chills, no weakness no fatigue HEAD: Normocephalic, atraumatic.  EYES: PERLA, EOMI No scleral icterus.  MOUTH: Moist mucosal membrane.  EAR, NOSE, THROAT: Clear without exudates. No external lesions.  NECK: Supple.  PULMONARY: CTA B/L + wheezing, +rhonchi,+ crackles CARDIOVASCULAR: S1 and S2. Regular rate and rhythm. No murmurs GASTROINTESTINAL: Soft, nontender, nondistended. Positive bowel sounds.  MUSCULOSKELETAL: No swelling, clubbing, or edema.  NEUROLOGIC: No gross focal neurological deficits. 5/5 strength all extremities SKIN: No ulceration, lesions, rashes, or cyanosis.  PSYCHIATRIC: Insight, judgment intact. -depression +anxiety ALL OTHER ROS ARE NEGATIVE   MEDICATIONS: I have reviewed all medications and confirmed regimen as documented      ASSESSMENT AND PLAN SYNOPSIS  86 year old white female with end-stage COPD with chronic hypoxic respiratory failure seen today for acute COPD exacerbation with chest congestion with ongoing bilateral rib and back pain most likely secondary to muscle spasms and cough however patient is miserable and is struggling to breathe.  I have advised patient to go to the emergency room for further evaluation  Patient to be assessed for COPD exacerbation We will need to consider prednisone therapy or IV steroids along with IV antibiotics Further diagnostic testing for back pain as well as chest x-ray needed Also recommend CBC and  CMP  Follow-up in 2 months  Total time spent 47 minutes  Corrin Parker, M.D.  [facility]  Medical Director  [place] Director [facility] Cardio-Pulmonary Department

## 2019-12-14 NOTE — H&P (Signed)
History and Physical   Natasha [name] NLZ:[id number] DOB: 1928/11/05 DOA: 12/13/2019  PCP: Rusty Aus, MD  Outpatient Specialists: Dr. Mortimer Fries at [facility] and [facility], Dr. Osie Bond Patient coming from: pulmonologist clinic  I have personally briefly reviewed patient's old medical records in [place].  Chief Concern: worsening shortness of breath   HPI: Natasha [name] is a 86 y.o. female with medical history significant for diastolic heart failure, NYHA class III, anemia, history of cellulitis and abscess of the leg in 2015, chronic obstructive asthma, COPD, CAD of the native coronary artery, diverticulosis, GERD, hypertension, hyperlipidemia, osteoarthritis, osteoporosis, peripheral vascular disease with claudication, restrictive airway, status post above-the-knee amputation of the lower extremity, trigeminal neuralgia, type II non-insulin-dependent diabetes mellitus, former tobacco user, history of anal fissurectomy, rec extraction status post cochlear implant on 04/2007, presented to the emergency department for chief concerns of severe shortness of breath with musculoskeletal chest and back pain.  She was evaluated by her pulmonologist, Dr. Mortimer Fries and at his urging presented to the emergency department for concerns of COPD exacerbation.  She reports congestion, cough that is productive, shortness of breath for two weeks. She reports it worsened in the last 2-3 days. She reports she has difficulty clearing her mucus.  She reports that she has been compliant with her inhaler, however the use of her inhaler did not help improved her symptoms thus prompting her to present to her pulmonologist office.  She endorses chest pain with cough and deep inhalation. She states the chest pain is sharp and lasts for seconds. She endorses dysuria and denies hematuria. She endorses back pain that started this morning and is sharp and persistent. It is worse with  coughing. She denies lost of appetite.   Social history: Lives with her husband.  Former tobacco user with 7.75-pack-year history.  Denies EtOH and recreational drug use.  ED Course: Discussed with ED provider, requesting admission for COPD exacerbation versus CAP Pneumonia. Vital signs were afebrile stable satting 100% on 6 L nasal cannula.  Reassuring.  Status post duo nebs x2, orders for azithromycin and ceftriaxone per ED provider.  CTA of the chest for PE was ordered and read as no pulmonary emboli.  Opacity dependently in the left base possible representation of atelectasis versus pneumonia.  Loss of height at T7, approximately 35% new since 2020, age indeterminate.  Large hiatal hernia with atelectasis that adjacent to the hernia in the right base.  Atherosclerotic changes in the thoracic aorta.  CAD.  Emphysema changes in the lungs.  Review of Systems: As per HPI otherwise 10 point review of systems negative.   Assessment/Plan  Active Problems:   CAP (community acquired pneumonia)   Acute on chronic hypoxic respiratory failure secondary to CAP pneumonia with COPD exacerbation -Requiring 6 L nasal cannula (baseline on 3 L nasal cannula) -Chest physiotherapy every 4 hours while patient is awake -Incentive spirometry for 10 reps q1h while awake and discussed extensively with patient via her writing pad -Consult to respiratory care treatment: Please instruct patient on how to use incentive spirometry. She needs to use IS for 10 reps q1h while awake. Thank you. -Continue azithromycin 500 mg IV for 5 days and ceftriaxone 2 g IV for 5 days -Solu-Medrol 60 mg IV twice daily  Heart failure preserved ejection fraction, NYHA class III-currently compensated at this time  -Last echo -No clinical suspicion for acute decompensation or exacerbation of heart failure -Resume home medication furosemide 40 mg p.o. daily,  metolazone 2.5 mg p.o. per instructions, spironolactone 25 mg  daily  Musculoskeletal pain-ketorolac 15 mg IV once -Lidocaine patch x2 to the thoracic back bilaterally -Ketorolac 50 mg IV every 6 hours for moderate pain, 2 doses ordered -LR 100 cc/h to complete 1 bag  Hyperlipidemia-resumed home simvastatin 20 mg nightly  Anxiety/depression-resumed home escitalopram 10 mg daily, lorazepam 0.5 mg p.o. every 4 hours as needed for anxiety  Leukocytosis-multifactorial, including Pneumonia versus demyelination from steroid use, CBC in the a.m.  Hypertension-controlled resumed home antihypertensives  Left AKA-scars appeared well-healed, no clinical suspicion for acute cellulitis/infection at this time  Restless leg syndrome-resumed home Mirapex  Chart reviewed.  Extensive surgical history.  Extensive vascular diseases including PAD and CAD.  DVT prophylaxis: On apixaban 2.5 mg twice daily Code Status: full code Diet: Heart healthy diet Family Communication: No Disposition Plan: Pending clinical course Consults called: None at this time Admission status: Inpatient  Past Medical History:  Diagnosis Date  . Anemia   . Arthritis   . Asthma   . Bilateral pneumonia may 2017  . Bronchitis   . Cardiomegaly   . Chronic respiratory failure with hypoxia ([place])   . Cochlear implant in place    bilateral, [name], 04/26/2007   . Gallstones 2015  . GERD (gastroesophageal reflux disease)   . H/O blood clots 2014  . History of home oxygen therapy    at night  . Hypertension   . Hypokalemia   . Peripheral vascular disease ([place])    with arterial clots- on xarelto  . Polyneuropathy   . Renal disorder    Chronic Kidney Disease, Stage 5  . Restless leg syndrome    Past Surgical History:  Procedure Laterality Date  . ABDOMINAL HYSTERECTOMY  1960  . APPENDECTOMY  1946  . BREAST BIOPSY Bilateral    negative  . CHOLECYSTECTOMY  04-24-13  . COCHLEAR IMPLANT  2009  . COLONOSCOPY  2015   Dr. Rayann Heman   . EYE SURGERY  2008,2009   cataract  .  KYPHOPLASTY N/A 08/19/2015   Procedure: KYPHOPLASTY;  Surgeon: Hessie Knows, MD;  Location: [facility] ORS;  Service: Orthopedics;  Laterality: N/A;  . PERIPHERAL VASCULAR CATHETERIZATION N/A 02/27/2015   Procedure: Abdominal Aortogram w/Lower Extremity;  Surgeon: Algernon Huxley, MD;  Location: [place] [facility];  Service: Cardiovascular;  Laterality: N/A;  . PERIPHERAL VASCULAR CATHETERIZATION  02/27/2015   Procedure: Lower Extremity Intervention;  Surgeon: Algernon Huxley, MD;  Location: [place] [facility];  Service: Cardiovascular;;  . PERIPHERAL VASCULAR CATHETERIZATION Left 02/28/2015   Procedure: Lower Extremity Angiography;  Surgeon: Algernon Huxley, MD;  Location: [place] [facility];  Service: Cardiovascular;  Laterality: Left;  . PERIPHERAL VASCULAR CATHETERIZATION  02/28/2015   Procedure: Lower Extremity Intervention;  Surgeon: Algernon Huxley, MD;  Location: [place] [facility];  Service: Cardiovascular;;  . PERIPHERAL VASCULAR CATHETERIZATION Left 05/22/2015   Procedure: Lower Extremity Angiography;  Surgeon: Algernon Huxley, MD;  Location: [place] [facility];  Service: Cardiovascular;  Laterality: Left;  . PERIPHERAL VASCULAR CATHETERIZATION  05/22/2015   Procedure: Lower Extremity Intervention;  Surgeon: Algernon Huxley, MD;  Location: [place] [facility];  Service: Cardiovascular;;  . PERIPHERAL VASCULAR CATHETERIZATION Left 05/23/2015   Procedure: Lower Extremity Angiography;  Surgeon: Algernon Huxley, MD;  Location: [place] [facility];  Service: Cardiovascular;  Laterality: Left;  . PERIPHERAL VASCULAR CATHETERIZATION  05/23/2015   Procedure: Lower Extremity Intervention;  Surgeon: Algernon Huxley, MD;  Location: [place] [facility];  Service: Cardiovascular;;  . stent placement  2006-2014   multiple stent placements in legs   Social History:  reports that she quit smoking about 39 years ago. Her smoking use included cigarettes. She has a 25.00 pack-year smoking history. She has never used smokeless tobacco.  She reports that she does not drink alcohol and does not use drugs.  Allergies  Allergen Reactions  . Codeine Diarrhea    Patient can tolerate small amounts of OXYCODONE per patient info sheet  . Hydrocodone Nausea And Vomiting    Patient can tolerate small amounts of OXYCODONE per patient info sheet  . Levofloxacin Other (See Comments)    Muscle cramps  . Tramadol Nausea And Vomiting   Family History  Problem Relation Age of Onset  . Heart disease Mother   . Heart disease Father   . Cancer Sister        lung  . Breast cancer Daughter    Family history: Family history reviewed and not pertinent  Prior to Admission medications   Medication Sig Start Date End Date Taking? Authorizing Provider  albuterol (PROVENTIL) (2.5 MG/3ML) 0.083% nebulizer solution Take 2.5 mg by nebulization every 6 (six) hours as needed for wheezing or shortness of breath.    [provider]  albuterol (VENTOLIN HFA) 108 (90 Base) MCG/ACT inhaler Inhale into the lungs every 6 (six) hours as needed for wheezing or shortness of breath.    [provider]  amoxicillin-clavulanate (AUGMENTIN) 875-125 MG tablet Take 1 tablet by mouth 2 (two) times daily.    [provider]  apixaban (ELIQUIS) 2.5 MG TABS tablet Take by mouth 2 (two) times daily.    [provider]  aspirin EC 81 MG tablet Take 81 mg by mouth daily. Swallow whole.    [provider]  benzocaine (HURRICAINE) 20 % GEL Use as directed in the mouth or throat.    [provider]  Calcium Carbonate-Vit D-Min (CALTRATE 600+D PLUS MINERALS) 600-800 MG-UNIT TABS Take 1 tablet by mouth daily.    [provider]  escitalopram (LEXAPRO) 10 MG tablet Take 10 mg by mouth daily.    [provider]  furosemide (LASIX) 40 MG tablet Take 40 mg by mouth daily.    [provider]  loratadine (CLARITIN) 10 MG tablet Take 10 mg by mouth daily.    [provider]  LORazepam (ATIVAN)  0.5 MG tablet Take 0.5 mg by mouth every 4 (four) hours as needed for anxiety.    [provider]  magnesium oxide (MAG-OX) 400 MG tablet Take 400 mg by mouth daily.    [provider]  metolazone (ZAROXOLYN) 2.5 MG tablet Take 2.5 mg by mouth. M,W,F    [provider]  montelukast (SINGULAIR) 10 MG tablet Take 10 mg by mouth at bedtime.    [provider]  Multiple Vitamins-Minerals (DECUBI-VITE PO) Take 1 capsule by mouth daily.    [provider]  pramipexole (MIRAPEX) 0.25 MG tablet Take 0.25 mg by mouth daily.    [provider]  sennosides-docusate sodium (SENOKOT-S) 8.6-50 MG tablet Take 1 tablet by mouth in the morning and at bedtime.    [provider]  silver sulfADIAZINE (SILVADENE) 1 % cream Apply 1 application topically daily.    [provider]  simvastatin (ZOCOR) 20 MG tablet Take 20 mg by mouth daily.    [provider]  sodium chloride (MURO 128) 2 % ophthalmic solution 1  drop.    [provider]  spironolactone (ALDACTONE) 25 MG tablet Take 25 mg by mouth daily.    [provider]   Physical Exam: Vitals:   01/08/2020 1430 12/21/2019 1500 12/23/2019 1530 12/27/2019 1600  BP:  (!) 114/55 (!) 114/54 (!) 119/55  Pulse: 84 81 81 80  Resp: (!) 22 17 19  (!) 21  Temp:      TempSrc:      SpO2: 93% 94% 95% 99%   Constitutional: appears age-appropriate, NAD, calm, comfortable Eyes: PERRL, lids and conjunctivae normal ENMT: Mucous membranes are moist. Posterior pharynx clear of any exudate or lesions. Age-appropriate dentition.  Severe hearing loss at baseline Neck: normal, supple, no masses, no thyromegaly Respiratory: Bilateral and diffuse wheezing on auscultation, no wheezing, no crackles.  Increased respiratory effort.  Using accessory muscle during respiration in prolonged speaking.  Cardiovascular: Regular rate and rhythm, no murmurs / rubs / gallops. No extremity edema. 2+ pedal  pulses. No carotid bruits.  Abdomen: Obese abdomen no tenderness, no masses palpated, no hepatosplenomegaly. Bowel sounds positive.  Musculoskeletal: no clubbing / cyanosis. No joint deformity upper extremities.  Left lower extremity AKA with chronic scar appears well-healed.  Chronic vascular changes of the right lower extremity.  Good ROM, no contractures, no atrophy. Normal muscle tone.  Skin: no rashes, lesions, ulcers. No induration Neurologic: Sensation intact. Strength 5/5 in all 4.  Psychiatric: Normal judgment and insight. Alert and oriented x 3. Normal mood.   EKG: Independently reviewed, showing sinus rhythm with rate of 89, infrequent PVCs, QTC 420  Chest x-ray on Admission: Personally reviewed and I agree with radiologist reading as below.  DG Chest 2 View  Result Date: 12/19/2019 CLINICAL DATA:  Short of breath EXAM: CHEST - 2 VIEW COMPARISON:  Chest CT 01/24/2018.  Chest x-ray 01/12/2018 FINDINGS: Heart size and vascularity normal. Large hiatal hernia on the right is unchanged with associated mild right lower lobe atelectasis. Negative for pneumonia or effusion. Left lung is clear. Atherosclerotic calcification aortic arch. IMPRESSION: Large hiatal hernia on the right. No acute abnormality and no change from prior studies. Electronically Signed   By: Franchot Gallo M.D.   On: 01/08/2020 11:07   CT Angio Chest PE W and/or Wo Contrast  Result Date: 12/20/2019 CLINICAL DATA:  Pulmonary embolus suspected. Shortness of breath, wheezing, and back pain. Patient has been on antibiotics without improvement. EXAM: CT ANGIOGRAPHY CHEST WITH CONTRAST TECHNIQUE: Multidetector CT imaging of the chest was performed using the standard protocol during bolus administration of intravenous contrast. Multiplanar CT image reconstructions and MIPs were obtained to evaluate the vascular anatomy. CONTRAST:  77mL OMNIPAQUE IOHEXOL 350 MG/ML SOLN COMPARISON:  Chest x-ray December 14, 2019. CT pulmonary angiogram  January 24, 2018. FINDINGS: Cardiovascular: Atherosclerotic changes seen in the nonaneurysmal aorta. No aortic dissection. The heart is unchanged. Calcified coronary artery disease is seen in the right coronary artery in the LAD. No pulmonary emboli. Mediastinum/Nodes: No effusions. The thyroid is unremarkable. There is a large hiatal hernia with the majority of the stomach above the diaphragm, to the right. This finding is unchanged since January of 2020. A few prominent lymph nodes are stable and of no concern. No suspicious adenopathy. Lungs/Pleura: Central airways are unchanged. No pneumothorax. Emphysematous changes are seen, particularly in the upper lobes. Atelectasis is seen adjacent to the right hiatal hernia. Platelike opacity in the right base on series 6, image 44 and coronal image 59 is consistent with atelectasis. There is opacity dependently in the  left base is well. No suspicious nodules or masses. Upper Abdomen: No acute abnormality. Musculoskeletal: There is loss of height of T7 is new since 2020 but otherwise age indeterminate. There is approximately for 35% loss of height. Review of the MIP images confirms the above findings. IMPRESSION: 1. No pulmonary emboli. 2. Opacity dependently in the left base could represent atelectasis or infiltrate/pneumonia. Recommend clinical correlation. 3. Loss of height of T7, approximately 35%, new since 2020 but otherwise age indeterminate. 4. Large hiatal hernia with atelectasis that adjacent to the hernia in the right base. 5. Atherosclerotic changes in the thoracic aorta. 6. Coronary artery disease as above. 7. Emphysematous changes in the lungs. Aortic Atherosclerosis (ICD10-I70.0) and Emphysema (ICD10-J43.9). Electronically Signed   By: Dorise Bullion III M.D   On: 12/15/2019 15:27   Labs on Admission: I have personally reviewed following labs  CBC: Recent Labs  Lab 12/15/2019 1020  WBC 16.2*  NEUTROABS 13.8*  HGB 12.2  HCT 39.1  MCV 85.9  PLT  778   Basic Metabolic Panel: Recent Labs  Lab 12/31/2019 1020  NA 133*  K 3.2*  CL 90*  CO2 28  GLUCOSE 149*  BUN 26*  CREATININE 1.00  CALCIUM 9.9   GFR: CrCl cannot be calculated (Unknown ideal weight.). Liver Function Tests: Recent Labs  Lab 01/06/2020 1020  AST 24  ALT 18  ALKPHOS 86  BILITOT 0.7  PROT 7.3  ALBUMIN 3.4*  Urine analysis:    Component Value Date/Time   COLORURINE YELLOW (A) 07/07/2017 2241   APPEARANCEUR HAZY (A) 07/07/2017 2241   APPEARANCEUR Clear 01/26/2014 1720   LABSPEC 1.011 07/07/2017 2241   LABSPEC 1.004 01/26/2014 1720   PHURINE 5.0 07/07/2017 2241   GLUCOSEU NEGATIVE 07/07/2017 2241   GLUCOSEU Negative 01/26/2014 1720   HGBUR NEGATIVE 07/07/2017 2241   BILIRUBINUR NEGATIVE 07/07/2017 2241   BILIRUBINUR Negative 01/26/2014 1720   KETONESUR NEGATIVE 07/07/2017 2241   PROTEINUR NEGATIVE 07/07/2017 2241   NITRITE NEGATIVE 07/07/2017 2241   LEUKOCYTESUR MODERATE (A) 07/07/2017 2241   LEUKOCYTESUR Negative 01/26/2014 1720   Carla Whilden N Caron Ode D.O. [facility]  If 12AM-7AM, please contact overnight-coverage provider If 7AM-7PM, please contact day coverage provider [email or web address]  01/09/2020, 5:06 PM

## 2019-12-14 NOTE — ED Notes (Signed)
Pt placed on 6L [place] due to o2 sats remaining at 88% on baseline 3L [place] with good wave form on pulse oximetry. Md Paduchowski aware.

## 2019-12-14 NOTE — Patient Instructions (Signed)
Acute COPD exacerbation Acute pain Recommend ER visitation

## 2019-12-15 ENCOUNTER — Other Ambulatory Visit: Payer: Self-pay

## 2019-12-15 LAB — COMPREHENSIVE METABOLIC PANEL
ALT: 18 U/L (ref 0–44)
AST: 25 U/L (ref 15–41)
Albumin: 2.9 g/dL — ABNORMAL LOW (ref 3.5–5.0)
Alkaline Phosphatase: 75 U/L (ref 38–126)
Anion gap: 12 (ref 5–15)
BUN: 23 mg/dL (ref 8–23)
CO2: 30 mmol/L (ref 22–32)
Calcium: 9.2 mg/dL (ref 8.9–10.3)
Chloride: 88 mmol/L — ABNORMAL LOW (ref 98–111)
Creatinine, Ser: 0.92 mg/dL (ref 0.44–1.00)
GFR, Estimated: 60 mL/min — ABNORMAL LOW (ref >60)
Glucose, Bld: 162 mg/dL — ABNORMAL HIGH (ref 70–99)
Potassium: 3.4 mmol/L — ABNORMAL LOW (ref 3.5–5.1)
Sodium: 130 mmol/L — ABNORMAL LOW (ref 135–145)
Total Bilirubin: 0.6 mg/dL (ref 0.3–1.2)
Total Protein: 6.5 g/dL (ref 6.5–8.1)

## 2019-12-15 LAB — CBC
HCT: 34.5 % — ABNORMAL LOW (ref 36.0–46.0)
Hemoglobin: 10.7 g/dL — ABNORMAL LOW (ref 12.0–15.0)
MCH: 27.2 pg (ref 26.0–34.0)
MCHC: 31 g/dL (ref 30.0–36.0)
MCV: 87.6 fL (ref 80.0–100.0)
Platelets: 307 10*3/uL (ref 150–400)
RBC: 3.94 MIL/uL (ref 3.87–5.11)
RDW: 17.2 % — ABNORMAL HIGH (ref 11.5–15.5)
WBC: 11 10*3/uL — ABNORMAL HIGH (ref 4.0–10.5)
nRBC: 0 % (ref 0.0–0.2)

## 2019-12-15 LAB — MRSA PCR SCREENING: MRSA by PCR: POSITIVE — AB

## 2019-12-15 MED ORDER — BENZONATATE 100 MG PO CAPS
200.0000 mg | ORAL_CAPSULE | Freq: Three times a day (TID) | ORAL | Status: DC | PRN
  Administered 2019-12-15 – 2019-12-20 (×2): 200 mg via ORAL
  Filled 2019-12-15 (×2): qty 2

## 2019-12-15 MED ORDER — SODIUM CHLORIDE 0.9 % IV SOLN
INTRAVENOUS | Status: DC | PRN
  Administered 2019-12-15 – 2019-12-22 (×4): 250 mL via INTRAVENOUS

## 2019-12-15 MED ORDER — ACETYLCYSTEINE 20 % IN SOLN
4.0000 mL | Freq: Once | RESPIRATORY_TRACT | Status: AC
  Administered 2019-12-15: 4 mL via RESPIRATORY_TRACT
  Filled 2019-12-15 (×2): qty 4

## 2019-12-15 MED ORDER — ALBUTEROL SULFATE (2.5 MG/3ML) 0.083% IN NEBU
2.5000 mg | INHALATION_SOLUTION | Freq: Four times a day (QID) | RESPIRATORY_TRACT | Status: AC
  Administered 2019-12-15: 2.5 mg via RESPIRATORY_TRACT
  Filled 2019-12-15 (×2): qty 3

## 2019-12-15 MED ORDER — POTASSIUM CHLORIDE CRYS ER 20 MEQ PO TBCR
40.0000 meq | EXTENDED_RELEASE_TABLET | Freq: Once | ORAL | Status: AC
  Administered 2019-12-15: 40 meq via ORAL
  Filled 2019-12-15 (×2): qty 2

## 2019-12-15 NOTE — Progress Notes (Signed)
PROGRESS NOTE    Natasha [name]  DGU:[id number]  DOB: 09-Mar-1928  DOA: 12/27/2019 PCP: Rusty Aus, MD Outpatient Specialists:   Hospital course:  86 year old female with PMH significant for HFpEF class III, COPD, DM 2, s/p AKA, HTN, CAD, diverticulosis and GERD was sent over from her pulmonologist Dr. Zoila Shutter office due to worsening respiratory status.  Patient has been coughing for the past 2 to 3 days with difficulty clearing her mucus and shortness of breath not improved with inhaler.  In the ED patient was noted to have increased work of breathing but able to maintain O2 saturations on 6 L nasal cannula and abnormal CT.  CTA was negative for PE but did show possible left base opacity suggestive of pneumonia.  Patient was noted be afebrile but did have leukocytosis at 16.  She was admitted for COPD exacerbation with community-acquired pneumonia.  She was started on ceftriaxone azithromycin.   Subjective:  Patient is requesting something to help decrease her cough.  Notes "I did not sleep at all last night and my chest hurts".  Unfortunately patient is allergic to hydrocodone.  Patient states she does not feel much better than she did yesterday.  The main thing that is bothering her is her inability to expectorate the phlegm that is in her chest.  Objective: Vitals:   12/15/19 1000 12/15/19 1030 12/15/19 1100 12/15/19 1219  BP: (!) 119/54 (!) 160/63 (!) 112/54 (!) 121/53  Pulse: 81 92 96 88  Resp: (!) 22 (!) 22 20 (!) 22  Temp: (!) 97.1 F (36.2 C)   (!) 97.1 F (36.2 C)  TempSrc: Axillary   Oral  SpO2: 94% 96%  93%    Intake/Output Summary (Last 24 hours) at 12/15/2019 1320 Last data filed at 12/15/2019 0522 Gross per 24 hour  Intake 50 ml  Output 100 ml  Net -50 ml   There were no vitals filed for this visit.   Exam:  General: Barrel chested, chronically ill-appearing female with significant kyphosis sitting up at 30 degrees with tachypnea but no labored  breathing.  Patient is able to speak in short sentences without difficulty.  She is very hard of hearing. Eyes: sclera anicteric, conjuctiva mild injection bilaterally CVS: S1-S2, regular  Respiratory:  decreased air entry bilaterally, few squeaks at bases bilaterally.  She has rare coarse rales throughout her lung fields.  No wheezes GI: NABS, soft, NT  LE: No edema.  Status post left AKA. Neuro: grossly nonfocal.   Assessment & Plan:   Acute on chronic respiratory failure secondary to CAP and COPD exacerbation Will add albuterol nebulizer to alternate with duo nebs for today. Continue DuoNebs every 6 hours while awake Mucomyst nebulizer to help with expectoration Tessalon Perles for cough suppressant Continue ceftriaxone and azithromycin day #2 today Continue prednisone 40 mg daily  Hyponatremia Possible some level of SIADH and/or intravascular fluid depletion given increased insensible losses Hold metolazone today Follow I's and O's and consider holding Lasix if warranted as well  Chronic pain Lidocaine patch and as needed Toradol for Patient is allergic to codeine/hydrocodone and opioids in general  HFpEF No evidence for acute decompensation Continue Lasix and spironolactone per home dose Will hold metolazone given increased insensible losses with CAP and tachypnea, hopefully will also help with hyponatremia  PVD Patient continues on Eliquis and aspirin per home doses  Anxiety and depression Continue escitalopram and lorazepam per home doses  HL Continue simvastatin  DVT prophylaxis: On Eliquis Code Status: Full  code Family Communication: None Disposition Plan:   Patient is from: Home  Anticipated Discharge Location: Home  Barriers to Discharge: CAP and acute exacerbation of COPD  Is patient medically stable for Discharge: No   Consultants:  None  Procedures:  None  Antimicrobials:  Ceftriaxone started 12/13/2019  Azithromycin started  12/16/2019   Data Reviewed:  Basic Metabolic Panel: Recent Labs  Lab 01/08/2020 1020 12/15/19 0515  NA 133* 130*  K 3.2* 3.4*  CL 90* 88*  CO2 28 30  GLUCOSE 149* 162*  BUN 26* 23  CREATININE 1.00 0.92  CALCIUM 9.9 9.2   Liver Function Tests: Recent Labs  Lab 01/01/2020 1020 12/15/19 0515  AST 24 25  ALT 18 18  ALKPHOS 86 75  BILITOT 0.7 0.6  PROT 7.3 6.5  ALBUMIN 3.4* 2.9*   No results for input(s): LIPASE, AMYLASE in the last 168 hours. No results for input(s): AMMONIA in the last 168 hours. CBC: Recent Labs  Lab 01/06/2020 1020 12/15/19 0515  WBC 16.2* 11.0*  NEUTROABS 13.8*  --   HGB 12.2 10.7*  HCT 39.1 34.5*  MCV 85.9 87.6  PLT 387 307   Cardiac Enzymes: No results for input(s): CKTOTAL, CKMB, CKMBINDEX, TROPONINI in the last 168 hours. BNP (last 3 results) No results for input(s): PROBNP in the last 8760 hours. CBG: No results for input(s): GLUCAP in the last 168 hours.  Recent Results (from the past 240 hour(s))  Resp Panel by RT-PCR (Flu A&B, Covid) Nasopharyngeal Swab     Status: None   Collection Time: 01/04/2020  3:35 PM   Specimen: Nasopharyngeal Swab; Nasopharyngeal(NP) swabs in vial transport medium  Result Value Ref Range Status   SARS Coronavirus 2 by RT PCR NEGATIVE NEGATIVE Final    Comment: (NOTE) SARS-CoV-2 target nucleic acids are NOT DETECTED.  The SARS-CoV-2 RNA is generally detectable in upper respiratory specimens during the acute phase of infection. The lowest concentration of SARS-CoV-2 viral copies this assay can detect is 138 copies/mL. A negative result does not preclude SARS-Cov-2 infection and should not be used as the sole basis for treatment or other patient management decisions. A negative result may occur with  improper specimen collection/handling, submission of specimen other than nasopharyngeal swab, presence of viral mutation(s) within the areas targeted by this assay, and inadequate number of viral copies(<138  copies/mL). A negative result must be combined with clinical observations, patient history, and epidemiological information. The expected result is Negative.  Fact Sheet for Patients:  EntrepreneurPulse.com.au  Fact Sheet for Healthcare Providers:  IncredibleEmployment.be  This test is no t yet approved or cleared by the [place] FDA and  has been authorized for detection and/or diagnosis of SARS-CoV-2 by FDA under an Emergency Use Authorization (EUA). This EUA will remain  in effect (meaning this test can be used) for the duration of the COVID-19 declaration under Section 564(b)(1) of the Act, 21 U.S.C.section 360bbb-3(b)(1), unless the authorization is terminated  or revoked sooner.       Influenza A by PCR NEGATIVE NEGATIVE Final   Influenza B by PCR NEGATIVE NEGATIVE Final    Comment: (NOTE) The Xpert Xpress SARS-CoV-2/FLU/RSV plus assay is intended as an aid in the diagnosis of influenza from Nasopharyngeal swab specimens and should not be used as a sole basis for treatment. Nasal washings and aspirates are unacceptable for Xpert Xpress SARS-CoV-2/FLU/RSV testing.  Fact Sheet for Patients: EntrepreneurPulse.com.au  Fact Sheet for Healthcare Providers: IncredibleEmployment.be  This test is not yet approved or cleared  by the [place] and has been authorized for detection and/or diagnosis of SARS-CoV-2 by FDA under an Emergency Use Authorization (EUA). This EUA will remain in effect (meaning this test can be used) for the duration of the COVID-19 declaration under Section 564(b)(1) of the Act, 21 U.S.C. section 360bbb-3(b)(1), unless the authorization is terminated or revoked.  Performed at [facility], [address] [place], [place] [address]   Blood culture (routine x 2)     Status: None (Preliminary result)   Collection Time: 01/09/2020  5:28 PM   Specimen: BLOOD   Result Value Ref Range Status   Specimen Description BLOOD LEFT FOREARM  Final   Special Requests   Final    BOTTLES DRAWN AEROBIC AND ANAEROBIC Blood Culture adequate volume   Culture   Final    NO GROWTH < 12 HOURS Performed at [facility], [address] [place], [place] [address]    Report Status PENDING  Incomplete  Blood culture (routine x 2)     Status: None (Preliminary result)   Collection Time: 12/17/2019  5:28 PM   Specimen: BLOOD  Result Value Ref Range Status   Specimen Description BLOOD RIGHT ASSIST CONTROL  Final   Special Requests   Final    BOTTLES DRAWN AEROBIC AND ANAEROBIC Blood Culture adequate volume   Culture   Final    NO GROWTH < 12 HOURS Performed at [facility], [address] [place], [place] [address]    Report Status PENDING  Incomplete      Studies: DG Chest 2 View  Result Date: 01/03/2020 CLINICAL DATA:  Short of breath EXAM: CHEST - 2 VIEW COMPARISON:  Chest CT 01/24/2018.  Chest x-ray 01/12/2018 FINDINGS: Heart size and vascularity normal. Large hiatal hernia on the right is unchanged with associated mild right lower lobe atelectasis. Negative for pneumonia or effusion. Left lung is clear. Atherosclerotic calcification aortic arch. IMPRESSION: Large hiatal hernia on the right. No acute abnormality and no change from prior studies. Electronically Signed   By: Franchot Gallo M.D.   On: 01/03/2020 11:07   CT Angio Chest PE W and/or Wo Contrast  Result Date: 12/28/2019 CLINICAL DATA:  Pulmonary embolus suspected. Shortness of breath, wheezing, and back pain. Patient has been on antibiotics without improvement. EXAM: CT ANGIOGRAPHY CHEST WITH CONTRAST TECHNIQUE: Multidetector CT imaging of the chest was performed using the standard protocol during bolus administration of intravenous contrast. Multiplanar CT image reconstructions and MIPs were obtained to evaluate the vascular anatomy. CONTRAST:  72mL OMNIPAQUE IOHEXOL 350 MG/ML SOLN  COMPARISON:  Chest x-ray December 14, 2019. CT pulmonary angiogram January 24, 2018. FINDINGS: Cardiovascular: Atherosclerotic changes seen in the nonaneurysmal aorta. No aortic dissection. The heart is unchanged. Calcified coronary artery disease is seen in the right coronary artery in the LAD. No pulmonary emboli. Mediastinum/Nodes: No effusions. The thyroid is unremarkable. There is a large hiatal hernia with the majority of the stomach above the diaphragm, to the right. This finding is unchanged since January of 2020. A few prominent lymph nodes are stable and of no concern. No suspicious adenopathy. Lungs/Pleura: Central airways are unchanged. No pneumothorax. Emphysematous changes are seen, particularly in the upper lobes. Atelectasis is seen adjacent to the right hiatal hernia. Platelike opacity in the right base on series 6, image 44 and coronal image 59 is consistent with atelectasis. There is opacity dependently in the left base is well. No suspicious nodules or masses. Upper Abdomen: No acute abnormality. Musculoskeletal:  There is loss of height of T7 is new since 2020 but otherwise age indeterminate. There is approximately for 35% loss of height. Review of the MIP images confirms the above findings. IMPRESSION: 1. No pulmonary emboli. 2. Opacity dependently in the left base could represent atelectasis or infiltrate/pneumonia. Recommend clinical correlation. 3. Loss of height of T7, approximately 35%, new since 2020 but otherwise age indeterminate. 4. Large hiatal hernia with atelectasis that adjacent to the hernia in the right base. 5. Atherosclerotic changes in the thoracic aorta. 6. Coronary artery disease as above. 7. Emphysematous changes in the lungs. Aortic Atherosclerosis (ICD10-I70.0) and Emphysema (ICD10-J43.9). Electronically Signed   By: Dorise Bullion III M.D   On: 12/23/2019 15:27     Scheduled Meds: . albuterol  2.5 mg Nebulization Q6H  . apixaban  2.5 mg Oral BID  . aspirin EC  81  mg Oral Daily  . calcium-vitamin D  1 tablet Oral Daily  . dextromethorphan-guaiFENesin  1 tablet Oral BID  . escitalopram  10 mg Oral Daily  . furosemide  40 mg Oral Daily  . ipratropium-albuterol  3 mL Nebulization TID AC & HS  . lidocaine  2 patch Transdermal Q24H  . montelukast  10 mg Oral QHS  . multivitamin with minerals   Oral Daily  . pramipexole  0.25 mg Oral Daily  . predniSONE  40 mg Oral Q breakfast  . simvastatin  20 mg Oral QHS  . spironolactone  25 mg Oral Daily   Continuous Infusions: . azithromycin Stopped (12/31/2019 1938)  . cefTRIAXone (ROCEPHIN)  IV Stopped (12/13/2019 1809)    Active Problems:   CAP (community acquired pneumonia)     Tyleek Smick Derek Jack, [facility]  If 7PM-7AM, please contact night-coverage [email or web address] Password [email or web address] 12/15/2019, 1:20 PM    LOS: 1 day

## 2019-12-15 NOTE — ED Notes (Signed)
Spoke to nurse Tammy that Bed Ready

## 2019-12-15 NOTE — ED Notes (Signed)
535ml of dark colored urine emptied from suction canister

## 2019-12-15 NOTE — ED Notes (Addendum)
Pt resting comfortably at this time. No distress noted. Pt has a non produtive cough that she states is bothering her, will message admitting MD to see if there is something we can give her. NAD noted. Call bell in reach.

## 2019-12-15 NOTE — ED Notes (Signed)
Pt placed on bedpan to have BM, pt desats with minimal exertion, breathing TX going at this time.

## 2019-12-16 LAB — CBC WITH DIFFERENTIAL/PLATELET
Abs Immature Granulocytes: 0.07 10*3/uL (ref 0.00–0.07)
Basophils Absolute: 0 10*3/uL (ref 0.0–0.1)
Basophils Relative: 0 %
Eosinophils Absolute: 0 10*3/uL (ref 0.0–0.5)
Eosinophils Relative: 0 %
HCT: 32.7 % — ABNORMAL LOW (ref 36.0–46.0)
Hemoglobin: 10.3 g/dL — ABNORMAL LOW (ref 12.0–15.0)
Immature Granulocytes: 1 %
Lymphocytes Relative: 6 %
Lymphs Abs: 0.7 10*3/uL (ref 0.7–4.0)
MCH: 27.2 pg (ref 26.0–34.0)
MCHC: 31.5 g/dL (ref 30.0–36.0)
MCV: 86.3 fL (ref 80.0–100.0)
Monocytes Absolute: 0.5 10*3/uL (ref 0.1–1.0)
Monocytes Relative: 5 %
Neutro Abs: 9.6 10*3/uL — ABNORMAL HIGH (ref 1.7–7.7)
Neutrophils Relative %: 88 %
Platelets: 301 10*3/uL (ref 150–400)
RBC: 3.79 MIL/uL — ABNORMAL LOW (ref 3.87–5.11)
RDW: 17.4 % — ABNORMAL HIGH (ref 11.5–15.5)
WBC: 10.9 10*3/uL — ABNORMAL HIGH (ref 4.0–10.5)
nRBC: 0 % (ref 0.0–0.2)

## 2019-12-16 LAB — BASIC METABOLIC PANEL
Anion gap: 10 (ref 5–15)
BUN: 24 mg/dL — ABNORMAL HIGH (ref 8–23)
CO2: 30 mmol/L (ref 22–32)
Calcium: 9.3 mg/dL (ref 8.9–10.3)
Chloride: 91 mmol/L — ABNORMAL LOW (ref 98–111)
Creatinine, Ser: 0.82 mg/dL (ref 0.44–1.00)
GFR, Estimated: >60 mL/min (ref >60)
Glucose, Bld: 145 mg/dL — ABNORMAL HIGH (ref 70–99)
Potassium: 4.3 mmol/L (ref 3.5–5.1)
Sodium: 131 mmol/L — ABNORMAL LOW (ref 135–145)

## 2019-12-16 MED ORDER — OXYCODONE HCL 5 MG PO TABS
2.5000 mg | ORAL_TABLET | Freq: Three times a day (TID) | ORAL | Status: DC | PRN
  Administered 2019-12-16 – 2019-12-20 (×7): 2.5 mg via ORAL
  Filled 2019-12-16 (×8): qty 1

## 2019-12-16 MED ORDER — ACETAMINOPHEN 325 MG PO TABS
650.0000 mg | ORAL_TABLET | Freq: Four times a day (QID) | ORAL | Status: DC | PRN
  Administered 2019-12-16 – 2019-12-18 (×3): 650 mg via ORAL
  Filled 2019-12-16 (×3): qty 2

## 2019-12-16 NOTE — Progress Notes (Signed)
PROGRESS NOTE    Natasha [name]  QIW:[id number]  DOB: 1928-08-13  DOA: 12/23/2019 PCP: Rusty Aus, MD Outpatient Specialists:   Hospital course:  86 year old female with PMH significant for HFpEF class III, COPD, DM 2, s/p AKA, HTN, CAD, diverticulosis and GERD was sent over from her pulmonologist Dr. Zoila Shutter office due to worsening respiratory status.  Patient has been coughing for the past 2 to 3 days with difficulty clearing her mucus and shortness of breath not improved with inhaler.  In the ED patient was noted to have increased work of breathing but able to maintain O2 saturations on 6 L nasal cannula and abnormal CT.  CTA was negative for PE but did show possible left base opacity suggestive of pneumonia.  Patient was noted be afebrile but did have leukocytosis at 16.  She was admitted for COPD exacerbation with community-acquired pneumonia.  She was started on ceftriaxone azithromycin.   Subjective:  Patient's main concern is pain in chest and back when she coughs.  I noted that she was allergic to hydrocodone and codeine.  She states that she can take "half a tablet of oxycodone and it does not upset me too much".  Patient is requesting oxycodone.  Patient is not sure if her breathing is better but she thinks that it is.  She still coughing but less.  Objective: Vitals:   12/16/19 0850 12/16/19 1117 12/16/19 1401 12/16/19 1550  BP: 124/64 (!) 123/51  (!) 120/57  Pulse: 79 88  84  Resp: 20 20    Temp: 98 F (36.7 C) 98.5 F (36.9 C)  98.9 F (37.2 C)  TempSrc: Oral Oral  Oral  SpO2: 95% 95% 92% 94%  Height:        Intake/Output Summary (Last 24 hours) at 12/16/2019 1622 Last data filed at 12/16/2019 1500 Gross per 24 hour  Intake 1115.66 ml  Output 1350 ml  Net -234.34 ml   There were no vitals filed for this visit.   Exam:  General: Barrel chested, chronically ill-appearing female with significant kyphosis sitting up in bed looking much more  comfortable than yesterday.  Less tachypnea.  No labored breathing.  Able to speak in full sentences without difficulty. Eyes: sclera anicteric, conjuctiva mild injection bilaterally CVS: S1-S2, regular  Respiratory: Improved but likely chronically diminished air entry.  Persistent rales at bases bilaterally. GI: NABS, soft, NT  LE: No edema.  Status post left AKA. Neuro: grossly nonfocal.   Assessment & Plan:   Acute on chronic respiratory failure secondary to CAP and COPD exacerbation Patient is improving with inhaled bronchodilator therapy, steroids and treatment for CAP.To new Tessalon Perles for cough suppressant although the oxycodone may be helpful as well. Continue DuoNebs every 6 hours while awake Mucomyst nebulizer to help with expectoration Continue ceftriaxone and azithromycin day #3 today Continue prednisone 40 mg daily  Hyponatremia Possible some level of SIADH and/or intravascular fluid depletion given increased insensible losses Continue to hold metolazone  Chronic pain with new acute chest pain with coughing Will add oxycodone 2.5 mg every 8 hours as needed for chest pain with coughing. Lidocaine patch and as needed Toradol for Patient is allergic to codeine/hydrocodone   HFpEF No evidence for acute decompensation Continue Lasix and spironolactone per home dose Will hold metolazone given increased insensible losses with CAP and tachypnea, hopefully will also help with hyponatremia  PVD Patient continues on Eliquis and aspirin per home doses  Anxiety and depression Continue escitalopram and lorazepam per  home doses  HL Continue simvastatin  DVT prophylaxis: On Eliquis Code Status: Full code Family Communication: None Disposition Plan:   Patient is from: Home  Anticipated Discharge Location: Home  Barriers to Discharge: CAP and acute exacerbation of COPD  Is patient medically stable for Discharge:  No   Consultants:  None  Procedures:  None  Antimicrobials:  Ceftriaxone started 01/07/2020  Azithromycin started 01/01/2020   Data Reviewed:  Basic Metabolic Panel: Recent Labs  Lab 01/01/2020 1020 12/15/19 0515 12/16/19 0501  NA 133* 130* 131*  K 3.2* 3.4* 4.3  CL 90* 88* 91*  CO2 28 30 30   GLUCOSE 149* 162* 145*  BUN 26* 23 24*  CREATININE 1.00 0.92 0.82  CALCIUM 9.9 9.2 9.3   Liver Function Tests: Recent Labs  Lab 12/16/2019 1020 12/15/19 0515  AST 24 25  ALT 18 18  ALKPHOS 86 75  BILITOT 0.7 0.6  PROT 7.3 6.5  ALBUMIN 3.4* 2.9*   No results for input(s): LIPASE, AMYLASE in the last 168 hours. No results for input(s): AMMONIA in the last 168 hours. CBC: Recent Labs  Lab 12/28/2019 1020 12/15/19 0515 12/16/19 0501  WBC 16.2* 11.0* 10.9*  NEUTROABS 13.8*  --  9.6*  HGB 12.2 10.7* 10.3*  HCT 39.1 34.5* 32.7*  MCV 85.9 87.6 86.3  PLT 387 307 301   Cardiac Enzymes: No results for input(s): CKTOTAL, CKMB, CKMBINDEX, TROPONINI in the last 168 hours. BNP (last 3 results) No results for input(s): PROBNP in the last 8760 hours. CBG: No results for input(s): GLUCAP in the last 168 hours.  Recent Results (from the past 240 hour(s))  Resp Panel by RT-PCR (Flu A&B, Covid) Nasopharyngeal Swab     Status: None   Collection Time: 01/05/2020  3:35 PM   Specimen: Nasopharyngeal Swab; Nasopharyngeal(NP) swabs in vial transport medium  Result Value Ref Range Status   SARS Coronavirus 2 by RT PCR NEGATIVE NEGATIVE Final    Comment: (NOTE) SARS-CoV-2 target nucleic acids are NOT DETECTED.  The SARS-CoV-2 RNA is generally detectable in upper respiratory specimens during the acute phase of infection. The lowest concentration of SARS-CoV-2 viral copies this assay can detect is 138 copies/mL. A negative result does not preclude SARS-Cov-2 infection and should not be used as the sole basis for treatment or other patient management decisions. A negative result may  occur with  improper specimen collection/handling, submission of specimen other than nasopharyngeal swab, presence of viral mutation(s) within the areas targeted by this assay, and inadequate number of viral copies(<138 copies/mL). A negative result must be combined with clinical observations, patient history, and epidemiological information. The expected result is Negative.  Fact Sheet for Patients:  EntrepreneurPulse.com.au  Fact Sheet for Healthcare Providers:  IncredibleEmployment.be  This test is no t yet approved or cleared by the [place] FDA and  has been authorized for detection and/or diagnosis of SARS-CoV-2 by FDA under an Emergency Use Authorization (EUA). This EUA will remain  in effect (meaning this test can be used) for the duration of the COVID-19 declaration under Section 564(b)(1) of the Act, 21 U.S.C.section 360bbb-3(b)(1), unless the authorization is terminated  or revoked sooner.       Influenza A by PCR NEGATIVE NEGATIVE Final   Influenza B by PCR NEGATIVE NEGATIVE Final    Comment: (NOTE) The Xpert Xpress SARS-CoV-2/FLU/RSV plus assay is intended as an aid in the diagnosis of influenza from Nasopharyngeal swab specimens and should not be used as a sole basis for treatment. Nasal  washings and aspirates are unacceptable for Xpert Xpress SARS-CoV-2/FLU/RSV testing.  Fact Sheet for Patients: EntrepreneurPulse.com.au  Fact Sheet for Healthcare Providers: IncredibleEmployment.be  This test is not yet approved or cleared by the [place] FDA and has been authorized for detection and/or diagnosis of SARS-CoV-2 by FDA under an Emergency Use Authorization (EUA). This EUA will remain in effect (meaning this test can be used) for the duration of the COVID-19 declaration under Section 564(b)(1) of the Act, 21 U.S.C. section 360bbb-3(b)(1), unless the authorization is terminated  or revoked.  Performed at [facility], [address] [place], [place] [address]   MRSA PCR Screening     Status: Abnormal   Collection Time: 12/18/2019  5:02 PM   Specimen: Nasal Mucosa; Nasopharyngeal  Result Value Ref Range Status   MRSA by PCR POSITIVE (A) NEGATIVE Final    Comment:        The GeneXpert MRSA Assay (FDA approved for NASAL specimens only), is one component of a comprehensive MRSA colonization surveillance program. It is not intended to diagnose MRSA infection nor to guide or monitor treatment for MRSA infections. RESULT CALLED TO, READ BACK BY AND VERIFIED WITH: WILACYNC STOVER 12/15/19 AT 2000 BY ACR Performed at [facility], [address] [place], [place] [address]   Blood culture (routine x 2)     Status: None (Preliminary result)   Collection Time: 01/11/2020  5:28 PM   Specimen: BLOOD  Result Value Ref Range Status   Specimen Description BLOOD LEFT FOREARM  Final   Special Requests   Final    BOTTLES DRAWN AEROBIC AND ANAEROBIC Blood Culture adequate volume   Culture   Final    NO GROWTH 2 DAYS Performed at [facility], [address], [place], [place] [address]    Report Status PENDING  Incomplete  Blood culture (routine x 2)     Status: None (Preliminary result)   Collection Time: 12/16/2019  5:28 PM   Specimen: BLOOD  Result Value Ref Range Status   Specimen Description BLOOD RIGHT ASSIST CONTROL  Final   Special Requests   Final    BOTTLES DRAWN AEROBIC AND ANAEROBIC Blood Culture adequate volume   Culture   Final    NO GROWTH 2 DAYS Performed at [facility], [address], [place], [place] [address]    Report Status PENDING  Incomplete      Studies: No results found.   Scheduled Meds: . apixaban  2.5 mg Oral BID  . aspirin EC  81 mg Oral Daily  . calcium-vitamin D  1 tablet Oral Daily  . dextromethorphan-guaiFENesin  1 tablet Oral BID  . escitalopram  10 mg Oral Daily  . furosemide  40  mg Oral Daily  . ipratropium-albuterol  3 mL Nebulization TID AC & HS  . lidocaine  2 patch Transdermal Q24H  . montelukast  10 mg Oral QHS  . multivitamin with minerals   Oral Daily  . pramipexole  0.25 mg Oral Daily  . predniSONE  40 mg Oral Q breakfast  . simvastatin  20 mg Oral QHS  . spironolactone  25 mg Oral Daily   Continuous Infusions: . sodium chloride Stopped (12/15/19 2126)  . azithromycin Stopped (12/15/19 1959)  . cefTRIAXone (ROCEPHIN)  IV Stopped (12/15/19 1831)    Active Problems:   CAP (community acquired pneumonia)     Kayliegh Boyers Derek Jack, [facility]  If 7PM-7AM, please contact night-coverage [email or web address] Password [facility] 12/16/2019, 4:22 PM  LOS: 2 days

## 2019-12-17 DIAGNOSIS — J9601 Acute respiratory failure with hypoxia: Secondary | ICD-10-CM

## 2019-12-17 DIAGNOSIS — R0781 Pleurodynia: Secondary | ICD-10-CM

## 2019-12-17 LAB — BASIC METABOLIC PANEL
Anion gap: 8 (ref 5–15)
BUN: 21 mg/dL (ref 8–23)
CO2: 34 mmol/L — ABNORMAL HIGH (ref 22–32)
Calcium: 9.6 mg/dL (ref 8.9–10.3)
Chloride: 94 mmol/L — ABNORMAL LOW (ref 98–111)
Creatinine, Ser: 0.74 mg/dL (ref 0.44–1.00)
GFR, Estimated: >60 mL/min (ref >60)
Glucose, Bld: 114 mg/dL — ABNORMAL HIGH (ref 70–99)
Potassium: 4.7 mmol/L (ref 3.5–5.1)
Sodium: 136 mmol/L (ref 135–145)

## 2019-12-17 LAB — CBC WITH DIFFERENTIAL/PLATELET
Abs Immature Granulocytes: 0.1 10*3/uL — ABNORMAL HIGH (ref 0.00–0.07)
Basophils Absolute: 0 10*3/uL (ref 0.0–0.1)
Basophils Relative: 0 %
Eosinophils Absolute: 0 10*3/uL (ref 0.0–0.5)
Eosinophils Relative: 0 %
HCT: 34 % — ABNORMAL LOW (ref 36.0–46.0)
Hemoglobin: 10.7 g/dL — ABNORMAL LOW (ref 12.0–15.0)
Immature Granulocytes: 1 %
Lymphocytes Relative: 14 %
Lymphs Abs: 1.7 10*3/uL (ref 0.7–4.0)
MCH: 27.2 pg (ref 26.0–34.0)
MCHC: 31.5 g/dL (ref 30.0–36.0)
MCV: 86.3 fL (ref 80.0–100.0)
Monocytes Absolute: 1.1 10*3/uL — ABNORMAL HIGH (ref 0.1–1.0)
Monocytes Relative: 9 %
Neutro Abs: 9.4 10*3/uL — ABNORMAL HIGH (ref 1.7–7.7)
Neutrophils Relative %: 76 %
Platelets: 300 10*3/uL (ref 150–400)
RBC: 3.94 MIL/uL (ref 3.87–5.11)
RDW: 17.7 % — ABNORMAL HIGH (ref 11.5–15.5)
WBC: 12.3 10*3/uL — ABNORMAL HIGH (ref 4.0–10.5)
nRBC: 0 % (ref 0.0–0.2)

## 2019-12-17 MED ORDER — IPRATROPIUM-ALBUTEROL 0.5-2.5 (3) MG/3ML IN SOLN
3.0000 mL | Freq: Three times a day (TID) | RESPIRATORY_TRACT | Status: DC
  Administered 2019-12-18 – 2019-12-26 (×24): 3 mL via RESPIRATORY_TRACT
  Filled 2019-12-17 (×26): qty 3

## 2019-12-17 MED ORDER — METHYLPREDNISOLONE SODIUM SUCC 125 MG IJ SOLR
60.0000 mg | Freq: Four times a day (QID) | INTRAMUSCULAR | Status: DC
  Administered 2019-12-17 – 2019-12-19 (×9): 60 mg via INTRAVENOUS
  Filled 2019-12-17 (×9): qty 2

## 2019-12-17 MED ORDER — KETOROLAC TROMETHAMINE 30 MG/ML IJ SOLN
15.0000 mg | Freq: Three times a day (TID) | INTRAMUSCULAR | Status: DC
  Administered 2019-12-17 – 2019-12-21 (×12): 15 mg via INTRAVENOUS
  Filled 2019-12-17 (×12): qty 1

## 2019-12-17 NOTE — Progress Notes (Signed)
PROGRESS NOTE    Natasha [name]  MCN:[id number]  DOB: 02-19-1928  DOA: 01/03/2020 PCP: Rusty Aus, MD Outpatient Specialists:   Hospital course:  86 year old female with PMH significant for HFpEF class III, COPD, DM 2, s/p AKA, HTN, CAD, diverticulosis and GERD was sent over from her pulmonologist Dr. Zoila Shutter office due to worsening respiratory status.  Patient has been coughing for the past 2 to 3 days with difficulty clearing her mucus and shortness of breath not improved with inhaler.  In the ED patient was noted to have increased work of breathing but able to maintain O2 saturations on 6 L nasal cannula and abnormal CT.  CTA was negative for PE but did show possible left base opacity suggestive of pneumonia.  Patient was noted be afebrile but did have leukocytosis at 16.  She was admitted for COPD exacerbation with community-acquired pneumonia.  She was started on ceftriaxone azithromycin.   Subjective: The patient continues to have a nagging cough. It is causing her to have severe pleuritic chest pain even with the addition of oxycodone yesterday.  Objective: Vitals:   12/17/19 0545 12/17/19 0729 12/17/19 1121 12/17/19 1516  BP: (!) 141/56 105/70 127/61 (!) 120/47  Pulse: 77 97 75 87  Resp: 18 18 20 20   Temp: 97.6 F (36.4 C) 98.2 F (36.8 C) 98.6 F (37 C) 98.7 F (37.1 C)  TempSrc:  Oral    SpO2: 99% (!) 84% 94% 90%  Height:        Intake/Output Summary (Last 24 hours) at 12/17/2019 1708 Last data filed at 12/17/2019 1419 Gross per 24 hour  Intake 710 ml  Output 1400 ml  Net -690 ml   There were no vitals filed for this visit.  Exam:  Constitutional:  . The patient is awake, alert, and oriented x 3. She is in moderate distress from pleuritic chest pain. Respiratory:  . No increased work of breathing. Marland Kitchen Positive for wheezes and rhonchi. No rales . No tactile fremitus Cardiovascular:  . Regular rate and rhythm . No murmurs, ectopy, or gallups. . No  lateral PMI. No thrills. Abdomen:  . Abdomen is soft, non-tender, non-distended . No hernias, masses, or organomegaly . Normoactive bowel sounds.  Musculoskeletal:  . No cyanosis, clubbing, or edema Skin:  . No rashes, lesions, ulcers . palpation of skin: no induration or nodules Neurologic:  . CN 2-12 intact . Sensation all 4 extremities intact Psychiatric:  . Mental status o Mood, affect appropriate o Orientation to person, place, time  . judgment and insight appear intact  Assessment & Plan:   Acute on chronic respiratory failure secondary to CAP and COPD exacerbation: Improving slowly.  Patient is improving with inhaled bronchodilator therapy, steroids and treatment for CAP.To new Tessalon Perles for cough suppressant although the oxycodone may be helpful as well. Continue DuoNebs every 6 hours while awake Mucomyst nebulizer to help with expectoration Continue ceftriaxone and azithromycin day #3 today Continue prednisone 40 mg daily  Pleuritic chest pain:  Not helped by oxycodone yesterday. I have started a low dose of toradol q 6 to help with this. Will only continue this for a couple of days as the patient is also on steroids.  Hyponatremia: Resolved.  Possible some level of SIADH and/or intravascular fluid depletion given increased insensible losses Continue to hold metolazone  Chronic pain with new acute chest pain with coughing Oxycodone 2.5 mg every 8 hours was added as needed for chest pain with coughing. Not really helping.  Lidocaine patch and as needed.  Patient is allergic to codeine/hydrocodone   HFpEF No evidence for acute decompensation Continue Lasix and spironolactone per home dose Will hold metolazone given increased insensible losses with CAP and tachypnea, hopefully will also help with hyponatremia. Monitor volume status. The patient is currently negative 2.574 in terms of fluid balance.  PVD Patient continues on Eliquis and aspirin per home  doses  Anxiety and depression Continue escitalopram and lorazepam per home doses  Hyperlipidemia: Continue simvastatin  I have seen and examined this patient myself. I have spent 34 minutes in her evaluation and care.  DVT prophylaxis: On Eliquis Code Status: Full code Family Communication: None Disposition Plan:   Patient is from: Home  Anticipated Discharge Location: Home  Barriers to Discharge: CAP and acute exacerbation of COPD  Is patient medically stable for Discharge: No   Consultants:  None  Procedures:  None  Antimicrobials:  Ceftriaxone started 01/11/2020  Azithromycin started 12/16/2019   Data Reviewed:  Basic Metabolic Panel: Recent Labs  Lab 01/08/2020 1020 12/15/19 0515 12/16/19 0501 12/17/19 0429  NA 133* 130* 131* 136  K 3.2* 3.4* 4.3 4.7  CL 90* 88* 91* 94*  CO2 28 30 30  34*  GLUCOSE 149* 162* 145* 114*  BUN 26* 23 24* 21  CREATININE 1.00 0.92 0.82 0.74  CALCIUM 9.9 9.2 9.3 9.6   Liver Function Tests: Recent Labs  Lab 12/23/2019 1020 12/15/19 0515  AST 24 25  ALT 18 18  ALKPHOS 86 75  BILITOT 0.7 0.6  PROT 7.3 6.5  ALBUMIN 3.4* 2.9*   No results for input(s): LIPASE, AMYLASE in the last 168 hours. No results for input(s): AMMONIA in the last 168 hours. CBC: Recent Labs  Lab 12/25/2019 1020 12/15/19 0515 12/16/19 0501 12/17/19 0429  WBC 16.2* 11.0* 10.9* 12.3*  NEUTROABS 13.8*  --  9.6* 9.4*  HGB 12.2 10.7* 10.3* 10.7*  HCT 39.1 34.5* 32.7* 34.0*  MCV 85.9 87.6 86.3 86.3  PLT 387 307 301 300   Cardiac Enzymes: No results for input(s): CKTOTAL, CKMB, CKMBINDEX, TROPONINI in the last 168 hours. BNP (last 3 results) No results for input(s): PROBNP in the last 8760 hours. CBG: No results for input(s): GLUCAP in the last 168 hours.  Recent Results (from the past 240 hour(s))  Resp Panel by RT-PCR (Flu A&B, Covid) Nasopharyngeal Swab     Status: None   Collection Time: 12/28/2019  3:35 PM   Specimen: Nasopharyngeal Swab;  Nasopharyngeal(NP) swabs in vial transport medium  Result Value Ref Range Status   SARS Coronavirus 2 by RT PCR NEGATIVE NEGATIVE Final    Comment: (NOTE) SARS-CoV-2 target nucleic acids are NOT DETECTED.  The SARS-CoV-2 RNA is generally detectable in upper respiratory specimens during the acute phase of infection. The lowest concentration of SARS-CoV-2 viral copies this assay can detect is 138 copies/mL. A negative result does not preclude SARS-Cov-2 infection and should not be used as the sole basis for treatment or other patient management decisions. A negative result may occur with  improper specimen collection/handling, submission of specimen other than nasopharyngeal swab, presence of viral mutation(s) within the areas targeted by this assay, and inadequate number of viral copies(<138 copies/mL). A negative result must be combined with clinical observations, patient history, and epidemiological information. The expected result is Negative.  Fact Sheet for Patients:  EntrepreneurPulse.com.au  Fact Sheet for Healthcare Providers:  IncredibleEmployment.be  This test is no t yet approved or cleared by the [place] and  has been authorized for detection and/or diagnosis of SARS-CoV-2 by FDA under an Emergency Use Authorization (EUA). This EUA will remain  in effect (meaning this test can be used) for the duration of the COVID-19 declaration under Section 564(b)(1) of the Act, 21 U.S.C.section 360bbb-3(b)(1), unless the authorization is terminated  or revoked sooner.       Influenza A by PCR NEGATIVE NEGATIVE Final   Influenza B by PCR NEGATIVE NEGATIVE Final    Comment: (NOTE) The Xpert Xpress SARS-CoV-2/FLU/RSV plus assay is intended as an aid in the diagnosis of influenza from Nasopharyngeal swab specimens and should not be used as a sole basis for treatment. Nasal washings and aspirates are unacceptable for Xpert Xpress  SARS-CoV-2/FLU/RSV testing.  Fact Sheet for Patients: EntrepreneurPulse.com.au  Fact Sheet for Healthcare Providers: IncredibleEmployment.be  This test is not yet approved or cleared by the [place] FDA and has been authorized for detection and/or diagnosis of SARS-CoV-2 by FDA under an Emergency Use Authorization (EUA). This EUA will remain in effect (meaning this test can be used) for the duration of the COVID-19 declaration under Section 564(b)(1) of the Act, 21 U.S.C. section 360bbb-3(b)(1), unless the authorization is terminated or revoked.  Performed at [facility], [address] [place], [place] [address]   MRSA PCR Screening     Status: Abnormal   Collection Time: 12/30/2019  5:02 PM   Specimen: Nasal Mucosa; Nasopharyngeal  Result Value Ref Range Status   MRSA by PCR POSITIVE (A) NEGATIVE Final    Comment:        The GeneXpert MRSA Assay (FDA approved for NASAL specimens only), is one component of a comprehensive MRSA colonization surveillance program. It is not intended to diagnose MRSA infection nor to guide or monitor treatment for MRSA infections. RESULT CALLED TO, READ BACK BY AND VERIFIED WITH: WILACYNC STOVER 12/15/19 AT 2000 BY ACR Performed at [facility], [address] [place], [place] [address]   Blood culture (routine x 2)     Status: None (Preliminary result)   Collection Time: 12/18/2019  5:28 PM   Specimen: BLOOD  Result Value Ref Range Status   Specimen Description BLOOD LEFT FOREARM  Final   Special Requests   Final    BOTTLES DRAWN AEROBIC AND ANAEROBIC Blood Culture adequate volume   Culture   Final    NO GROWTH 3 DAYS Performed at [facility], [address], [place], [place] [address]    Report Status PENDING  Incomplete  Blood culture (routine x 2)     Status: None (Preliminary result)   Collection Time: 12/21/2019  5:28 PM   Specimen: BLOOD  Result Value Ref  Range Status   Specimen Description BLOOD RIGHT ASSIST CONTROL  Final   Special Requests   Final    BOTTLES DRAWN AEROBIC AND ANAEROBIC Blood Culture adequate volume   Culture   Final    NO GROWTH 3 DAYS Performed at [facility], [address], [place], [place] [address]    Report Status PENDING  Incomplete      Studies: No results found.   Scheduled Meds: . apixaban  2.5 mg Oral BID  . aspirin EC  81 mg Oral Daily  . calcium-vitamin D  1 tablet Oral Daily  . dextromethorphan-guaiFENesin  1 tablet Oral BID  . escitalopram  10 mg Oral Daily  . furosemide  40 mg Oral Daily  . ipratropium-albuterol  3 mL Nebulization TID AC & HS  . ketorolac  15 mg Intravenous Q8H  . lidocaine  2 patch Transdermal Q24H  . methylPREDNISolone (SOLU-MEDROL) injection  60 mg Intravenous Q6H  . montelukast  10 mg Oral QHS  . multivitamin with minerals   Oral Daily  . pramipexole  0.25 mg Oral Daily  . simvastatin  20 mg Oral QHS  . spironolactone  25 mg Oral Daily   Continuous Infusions: . sodium chloride Stopped (12/15/19 2126)  . azithromycin 500 mg (12/17/19 1706)  . cefTRIAXone (ROCEPHIN)  IV Stopped (12/16/19 1929)    Active Problems:   CAP (community acquired pneumonia)   Kazzandra Desaulniers, [facility]  If 7PM-7AM, please contact night-coverage [email or web address] Password [email or web address] 12/17/2019, 5:08 PM    LOS: 3 days

## 2019-12-17 NOTE — Care Management Important Message (Signed)
Important Message  Patient Details  Name: Natasha [name] MRN: [id number] Date of Birth: Oct 17, 1928   Medicare Important Message Given:  Yes     Dannette Barbara 12/17/2019, 10:38 AM

## 2019-12-18 ENCOUNTER — Inpatient Hospital Stay: Payer: Medicare Other

## 2019-12-18 DIAGNOSIS — D72829 Elevated white blood cell count, unspecified: Secondary | ICD-10-CM

## 2019-12-18 LAB — CBC WITH DIFFERENTIAL/PLATELET
Abs Immature Granulocytes: 0.12 10*3/uL — ABNORMAL HIGH (ref 0.00–0.07)
Basophils Absolute: 0 10*3/uL (ref 0.0–0.1)
Basophils Relative: 0 %
Eosinophils Absolute: 0 10*3/uL (ref 0.0–0.5)
Eosinophils Relative: 0 %
HCT: 35 % — ABNORMAL LOW (ref 36.0–46.0)
Hemoglobin: 11.1 g/dL — ABNORMAL LOW (ref 12.0–15.0)
Immature Granulocytes: 1 %
Lymphocytes Relative: 8 %
Lymphs Abs: 0.9 10*3/uL (ref 0.7–4.0)
MCH: 27.5 pg (ref 26.0–34.0)
MCHC: 31.7 g/dL (ref 30.0–36.0)
MCV: 86.6 fL (ref 80.0–100.0)
Monocytes Absolute: 0.2 10*3/uL (ref 0.1–1.0)
Monocytes Relative: 2 %
Neutro Abs: 10.3 10*3/uL — ABNORMAL HIGH (ref 1.7–7.7)
Neutrophils Relative %: 89 %
Platelets: 320 10*3/uL (ref 150–400)
RBC: 4.04 MIL/uL (ref 3.87–5.11)
RDW: 17.5 % — ABNORMAL HIGH (ref 11.5–15.5)
WBC: 11.5 10*3/uL — ABNORMAL HIGH (ref 4.0–10.5)
nRBC: 0 % (ref 0.0–0.2)

## 2019-12-18 LAB — COMPREHENSIVE METABOLIC PANEL
ALT: 18 U/L (ref 0–44)
AST: 19 U/L (ref 15–41)
Albumin: 3.1 g/dL — ABNORMAL LOW (ref 3.5–5.0)
Alkaline Phosphatase: 72 U/L (ref 38–126)
Anion gap: 11 (ref 5–15)
BUN: 28 mg/dL — ABNORMAL HIGH (ref 8–23)
CO2: 31 mmol/L (ref 22–32)
Calcium: 9.3 mg/dL (ref 8.9–10.3)
Chloride: 91 mmol/L — ABNORMAL LOW (ref 98–111)
Creatinine, Ser: 0.76 mg/dL (ref 0.44–1.00)
GFR, Estimated: >60 mL/min (ref >60)
Glucose, Bld: 183 mg/dL — ABNORMAL HIGH (ref 70–99)
Potassium: 4.4 mmol/L (ref 3.5–5.1)
Sodium: 133 mmol/L — ABNORMAL LOW (ref 135–145)
Total Bilirubin: 0.3 mg/dL (ref 0.3–1.2)
Total Protein: 6.3 g/dL — ABNORMAL LOW (ref 6.5–8.1)

## 2019-12-18 LAB — LEGIONELLA PNEUMOPHILA SEROGP 1 UR AG: L. pneumophila Serogp 1 Ur Ag: NEGATIVE

## 2019-12-18 LAB — PHOSPHORUS: Phosphorus: 2.6 mg/dL (ref 2.5–4.6)

## 2019-12-18 LAB — MAGNESIUM: Magnesium: 2.4 mg/dL (ref 1.7–2.4)

## 2019-12-18 MED ORDER — ARFORMOTEROL TARTRATE 15 MCG/2ML IN NEBU
15.0000 ug | INHALATION_SOLUTION | Freq: Two times a day (BID) | RESPIRATORY_TRACT | Status: DC
  Administered 2019-12-18 – 2019-12-24 (×11): 15 ug via RESPIRATORY_TRACT
  Filled 2019-12-18 (×13): qty 2

## 2019-12-18 MED ORDER — IPRATROPIUM-ALBUTEROL 0.5-2.5 (3) MG/3ML IN SOLN
RESPIRATORY_TRACT | Status: AC
  Filled 2019-12-18: qty 3

## 2019-12-18 MED ORDER — MUPIROCIN 2 % EX OINT
1.0000 "application " | TOPICAL_OINTMENT | Freq: Two times a day (BID) | CUTANEOUS | Status: AC
  Administered 2019-12-18 – 2019-12-22 (×9): 1 via NASAL
  Filled 2019-12-18: qty 22

## 2019-12-18 MED ORDER — GUAIFENESIN ER 600 MG PO TB12
1200.0000 mg | ORAL_TABLET | Freq: Two times a day (BID) | ORAL | Status: DC
  Administered 2019-12-18 – 2019-12-20 (×5): 1200 mg via ORAL
  Filled 2019-12-18 (×6): qty 2

## 2019-12-18 MED ORDER — CHLORHEXIDINE GLUCONATE CLOTH 2 % EX PADS
6.0000 | MEDICATED_PAD | Freq: Every day | CUTANEOUS | Status: AC
  Administered 2019-12-18 – 2019-12-21 (×4): 6 via TOPICAL

## 2019-12-18 MED ORDER — BUDESONIDE 0.25 MG/2ML IN SUSP
0.2500 mg | Freq: Two times a day (BID) | RESPIRATORY_TRACT | Status: DC
  Administered 2019-12-18 – 2019-12-26 (×16): 0.25 mg via RESPIRATORY_TRACT
  Filled 2019-12-18 (×16): qty 2

## 2019-12-18 NOTE — Progress Notes (Signed)
PROGRESS NOTE    Natasha [name]  ACZ:[id number] DOB: Jul 29, 1928 DOA: 12/16/2019 PCP: Rusty Aus, MD   Brief Narrative:  The patient is a 86 year old female with PMH significant for HFpEF class III, COPD, DM 2, s/p AKA, HTN, CAD, diverticulosis and GERD was sent over from her pulmonologist Dr. Zoila Shutter office due to worsening respiratory status.  Patient has been coughing for the past 2 to 3 days with difficulty clearing her mucus and shortness of breath not improved with inhaler.  In the ED patient was noted to have increased work of breathing but able to maintain O2 saturations on 6 L nasal cannula and abnormal CT.  CTA was negative for PE but did show possible left base opacity suggestive of pneumonia.  Patient was noted be afebrile but did have leukocytosis at 16.  She was admitted for COPD exacerbation with community-acquired pneumonia.  She was started on ceftriaxone azithromycin.  He continues to be dyspneic and continues to have respiratory distress.  Breathing treatments have been changed around and she has been scheduled on IV Solu-Medrol and was also started on Brovana, budesonide, DuoNeb scheduled and also has Mucomyst.  If her respiratory status is not improving will consult pulmonary in the morning.   Assessment & Plan:   Active Problems:   CAP (community acquired pneumonia)  Acute on chronic respiratory failure secondary to CAP and COPD exacerbation:  -Improving slowly.  Patient is improving with inhaled bronchodilator therapy, steroids and treatment for CAP. -Will continue Tessalon Perles for cough suppressant although the oxycodone may be helpful as well. -Continue DuoNebs every 6 hours while awake -Mucomyst nebulizer to help with expectoration -SpO2: 95 % O2 Flow Rate (L/min): 6 L/min; at baseline patient wears 3 L of supplemental oxygen via nasal cannula -Will add Budesonide and Brovana -Continue ceftriaxone and azithromycin day #4 today -Continue with flutter  valve, incentive spirometry as well as guaifenesin 1200 g p.o. twice daily and stop potassium DM for now -Continued prednisone 40 mg daily but will go to IV Solumedrol 60 q16h -Patient does have a leukocytosis and has been trending around 11. Last WBC was 11.5 -Repeat CXR in the AM  -If she is not improving may need a CTA of the chest as well as a pulmonary consultation Consider MetaNeb  Pleuritic chest pain:  -Not helped by oxycodone yesterday.  She was initiated on started a low dose of toradol q 6 to help with this.  -Will only continue this for a couple of days as the patient is also on steroids.  Hyponatremia:  -Resolved initially and went to 136 but sodium is back down to 133 -Possible some level of SIADH and/or intravascular fluid depletion given increased insensible losses -Continue to hold metolazone -Continue to monitor and trend and repeat CMP in a.m.  Chronic pain with new acute chest pain with coughing -Oxycodone 2.5 mg every 8 hours was added as needed for chest pain with coughing. Not really helping. -Lidocaine patch and as needed.  -Patient is allergic to codeine/hydrocodone  -Continue to monitor carefully  HFpEF -No evidence for acute decompensation -Continue Lasix and spironolactone per home dose -Will hold metolazone given increased insensible losses with CAP and tachypnea, hopefully will also help with hyponatremia. Monitor volume status.  -The patient is currently negative 2.244 in terms of fluid balance. -Chest x-ray in a.m.  PVD -Patient continues on Eliquis and aspirin per home doses as well as simvastatin  Anxiety and depression -Continue escitalopram and lorazepam per home doses  Hyperlipidemia: -Continue Simvastatin  Leukocytosis  -In the setting of steroid margination -continue to monitor for signs and symptoms infection -Repeat CBC in a.m.  Normocytic Anemia -Relatively stable last few checks as Hgb/Hct is now 11.1/35.0 -Check anemia  panel in a.m. -Continue to monitor for signs and symptoms of bleeding; currently no overt bleeding noted -Repeat CBC in a.m.   DVT prophylaxis: Anticoagulated with Apixaban  Code Status: FULL CODE  Family Communication: No family present at bedside  Disposition Plan: Pending further clinical improvement of respiratory status back to baseline and evaluation by PT and OT  Status is: Inpatient  Remains inpatient appropriate because:Unsafe d/c plan, IV treatments appropriate due to intensity of illness or inability to take PO and Inpatient level of care appropriate due to severity of illness   Dispo: The patient is from: Home              Anticipated d/c is to: TBD              Anticipated d/c date is: 2 days              Patient currently is not medically stable to d/c.   Consultants:   None   Procedures: None  Antimicrobials:  Anti-infectives (From admission, onward)   Start     Dose/Rate Route Frequency Ordered Stop   12/27/2019 1730  cefTRIAXone (ROCEPHIN) 2 g in sodium chloride 0.9 % 100 mL IVPB        2 g 200 mL/hr over 30 Minutes Intravenous Every 24 hours 12/16/2019 1702 12/19/19 1729   01/11/2020 1730  azithromycin (ZITHROMAX) 500 mg in sodium chloride 0.9 % 250 mL IVPB        500 mg 250 mL/hr over 60 Minutes Intravenous Every 24 hours 12/20/2019 1702 12/19/19 1729   12/25/2019 1645  cefTRIAXone (ROCEPHIN) 1 g in sodium chloride 0.9 % 100 mL IVPB  Status:  Discontinued        1 g 200 mL/hr over 30 Minutes Intravenous  Once 01/03/2020 1631 12/25/2019 1719   12/17/2019 1645  azithromycin (ZITHROMAX) 500 mg in sodium chloride 0.9 % 250 mL IVPB  Status:  Discontinued        500 mg 250 mL/hr over 60 Minutes Intravenous  Once 01/08/2020 1631 12/12/2019 1721        Subjective: Patient was seen and examined at bedside and she states that she was not feeling well today and still felt very short of breath.  Also complained of some pleuritic chest pain.  No nausea or vomiting.  Denies any  lightheadedness or dizziness.  Continues to have cough.  No other concerns or complaints at this time.  Objective: Vitals:   12/17/19 1922 12/17/19 2319 12/18/19 0300 12/18/19 0736  BP: (!) 105/54 (!) 123/57 120/61 (!) 157/81  Pulse: 83 79 82 79  Resp: 17 18 18 20   Temp: 97.6 F (36.4 C) 97.8 F (36.6 C) 97.8 F (36.6 C) 98.7 F (37.1 C)  TempSrc:      SpO2: 93% 93% 94% 95%  Height:        Intake/Output Summary (Last 24 hours) at 12/18/2019 0749 Last data filed at 12/18/2019 0300 Gross per 24 hour  Intake 950 ml  Output 1400 ml  Net -450 ml   There were no vitals filed for this visit.  Examination: Physical Exam:  Constitutional: The patient is a thin elderly Caucasian ill-appearing female who appears dyspneic Eyes: Lids and conjunctivae normal, sclerae anicteric  ENMT: External Ears,  Nose appear normal.  She is extremely hard of hearing and has cochlear implants and writes to communicate Neck: Appears normal, supple, no cervical masses, normal ROM, no appreciable thyromegaly; no JVD Respiratory: Diminished to auscultation bilaterally with coarse breath sounds and some rhonchi but no appreciable crackles.  Has a slightly increased respiratory effort and she is wearing supplemental oxygen via nasal cannula Cardiovascular: RRR, no murmurs / rubs / gallops. S1 and S2 auscultated.  Has mild lower extremity 1+ edema on the right.  Has a left AKA Abdomen: Soft, non-tender, non-distended.  Bowel sounds present GU: Deferred. Musculoskeletal: No clubbing / cyanosis of digits/nails.  Is a left AKA, history of ischemic leg Skin: No rashes, lesions, ulcers on his skin evaluation. No induration; Warm and dry.  Neurologic: CN 2-12 grossly intact with no focal deficits. Romberg sign and cerebellar reflexes not assessed.  Psychiatric: Normal judgment and insight. Alert and oriented x 3.  Appears anxious mood and appropriate affect.   Data Reviewed: I have personally reviewed following labs  and imaging studies  CBC: Recent Labs  Lab 12/19/2019 1020 12/15/19 0515 12/16/19 0501 12/17/19 0429  WBC 16.2* 11.0* 10.9* 12.3*  NEUTROABS 13.8*  --  9.6* 9.4*  HGB 12.2 10.7* 10.3* 10.7*  HCT 39.1 34.5* 32.7* 34.0*  MCV 85.9 87.6 86.3 86.3  PLT 387 307 301 756   Basic Metabolic Panel: Recent Labs  Lab 12/30/2019 1020 12/15/19 0515 12/16/19 0501 12/17/19 0429  NA 133* 130* 131* 136  K 3.2* 3.4* 4.3 4.7  CL 90* 88* 91* 94*  CO2 28 30 30  34*  GLUCOSE 149* 162* 145* 114*  BUN 26* 23 24* 21  CREATININE 1.00 0.92 0.82 0.74  CALCIUM 9.9 9.2 9.3 9.6   GFR: CrCl cannot be calculated (Unknown ideal weight.). Liver Function Tests: Recent Labs  Lab 12/25/2019 1020 12/15/19 0515  AST 24 25  ALT 18 18  ALKPHOS 86 75  BILITOT 0.7 0.6  PROT 7.3 6.5  ALBUMIN 3.4* 2.9*   No results for input(s): LIPASE, AMYLASE in the last 168 hours. No results for input(s): AMMONIA in the last 168 hours. Coagulation Profile: No results for input(s): INR, PROTIME in the last 168 hours. Cardiac Enzymes: No results for input(s): CKTOTAL, CKMB, CKMBINDEX, TROPONINI in the last 168 hours. BNP (last 3 results) No results for input(s): PROBNP in the last 8760 hours. HbA1C: No results for input(s): HGBA1C in the last 72 hours. CBG: No results for input(s): GLUCAP in the last 168 hours. Lipid Profile: No results for input(s): CHOL, HDL, LDLCALC, TRIG, CHOLHDL, LDLDIRECT in the last 72 hours. Thyroid Function Tests: No results for input(s): TSH, T4TOTAL, FREET4, T3FREE, THYROIDAB in the last 72 hours. Anemia Panel: No results for input(s): VITAMINB12, FOLATE, FERRITIN, TIBC, IRON, RETICCTPCT in the last 72 hours. Sepsis Labs: No results for input(s): PROCALCITON, LATICACIDVEN in the last 168 hours.  Recent Results (from the past 240 hour(s))  Resp Panel by RT-PCR (Flu A&B, Covid) Nasopharyngeal Swab     Status: None   Collection Time: 12/12/2019  3:35 PM   Specimen: Nasopharyngeal Swab;  Nasopharyngeal(NP) swabs in vial transport medium  Result Value Ref Range Status   SARS Coronavirus 2 by RT PCR NEGATIVE NEGATIVE Final    Comment: (NOTE) SARS-CoV-2 target nucleic acids are NOT DETECTED.  The SARS-CoV-2 RNA is generally detectable in upper respiratory specimens during the acute phase of infection. The lowest concentration of SARS-CoV-2 viral copies this assay can detect is 138 copies/mL. A negative result  does not preclude SARS-Cov-2 infection and should not be used as the sole basis for treatment or other patient management decisions. A negative result may occur with  improper specimen collection/handling, submission of specimen other than nasopharyngeal swab, presence of viral mutation(s) within the areas targeted by this assay, and inadequate number of viral copies(<138 copies/mL). A negative result must be combined with clinical observations, patient history, and epidemiological information. The expected result is Negative.  Fact Sheet for Patients:  EntrepreneurPulse.com.au  Fact Sheet for Healthcare Providers:  IncredibleEmployment.be  This test is no t yet approved or cleared by the [place] FDA and  has been authorized for detection and/or diagnosis of SARS-CoV-2 by FDA under an Emergency Use Authorization (EUA). This EUA will remain  in effect (meaning this test can be used) for the duration of the COVID-19 declaration under Section 564(b)(1) of the Act, 21 U.S.C.section 360bbb-3(b)(1), unless the authorization is terminated  or revoked sooner.       Influenza A by PCR NEGATIVE NEGATIVE Final   Influenza B by PCR NEGATIVE NEGATIVE Final    Comment: (NOTE) The Xpert Xpress SARS-CoV-2/FLU/RSV plus assay is intended as an aid in the diagnosis of influenza from Nasopharyngeal swab specimens and should not be used as a sole basis for treatment. Nasal washings and aspirates are unacceptable for Xpert Xpress  SARS-CoV-2/FLU/RSV testing.  Fact Sheet for Patients: EntrepreneurPulse.com.au  Fact Sheet for Healthcare Providers: IncredibleEmployment.be  This test is not yet approved or cleared by the [place] FDA and has been authorized for detection and/or diagnosis of SARS-CoV-2 by FDA under an Emergency Use Authorization (EUA). This EUA will remain in effect (meaning this test can be used) for the duration of the COVID-19 declaration under Section 564(b)(1) of the Act, 21 U.S.C. section 360bbb-3(b)(1), unless the authorization is terminated or revoked.  Performed at [facility], [address] [place], [place] [address]   MRSA PCR Screening     Status: Abnormal   Collection Time: 01/01/2020  5:02 PM   Specimen: Nasal Mucosa; Nasopharyngeal  Result Value Ref Range Status   MRSA by PCR POSITIVE (A) NEGATIVE Final    Comment:        The GeneXpert MRSA Assay (FDA approved for NASAL specimens only), is one component of a comprehensive MRSA colonization surveillance program. It is not intended to diagnose MRSA infection nor to guide or monitor treatment for MRSA infections. RESULT CALLED TO, READ BACK BY AND VERIFIED WITH: WILACYNC STOVER 12/15/19 AT 2000 BY ACR Performed at [facility], [address] [place], [place] [address]   Blood culture (routine x 2)     Status: None (Preliminary result)   Collection Time: 01/06/2020  5:28 PM   Specimen: BLOOD  Result Value Ref Range Status   Specimen Description BLOOD LEFT FOREARM  Final   Special Requests   Final    BOTTLES DRAWN AEROBIC AND ANAEROBIC Blood Culture adequate volume   Culture   Final    NO GROWTH 4 DAYS Performed at [facility], [address], [place], [place] [address]    Report Status PENDING  Incomplete  Blood culture (routine x 2)     Status: None (Preliminary result)   Collection Time: 12/23/2019  5:28 PM   Specimen: BLOOD  Result Value Ref  Range Status   Specimen Description BLOOD RIGHT ASSIST CONTROL  Final   Special Requests   Final    BOTTLES DRAWN AEROBIC AND ANAEROBIC Blood Culture adequate volume  Culture   Final    NO GROWTH 4 DAYS Performed at [facility], [address] [place], [place] [address]    Report Status PENDING  Incomplete     RN Pressure Injury Documentation:     Estimated body mass index is 26.66 kg/m as calculated from the following:   Height as of this encounter: 4\' 11"  (1.499 m).   Weight as of 01/24/18: 59.9 kg.  Malnutrition Type:      Malnutrition Characteristics:      Nutrition Interventions:    Radiology Studies: No results found.   Scheduled Meds: . apixaban  2.5 mg Oral BID  . aspirin EC  81 mg Oral Daily  . calcium-vitamin D  1 tablet Oral Daily  . dextromethorphan-guaiFENesin  1 tablet Oral BID  . escitalopram  10 mg Oral Daily  . furosemide  40 mg Oral Daily  . ipratropium-albuterol  3 mL Nebulization TID  . ipratropium-albuterol      . ketorolac  15 mg Intravenous Q8H  . lidocaine  2 patch Transdermal Q24H  . methylPREDNISolone (SOLU-MEDROL) injection  60 mg Intravenous Q6H  . montelukast  10 mg Oral QHS  . multivitamin with minerals   Oral Daily  . pramipexole  0.25 mg Oral Daily  . simvastatin  20 mg Oral QHS  . spironolactone  25 mg Oral Daily   Continuous Infusions: . sodium chloride Stopped (12/15/19 2126)  . azithromycin Stopped (12/17/19 1813)  . cefTRIAXone (ROCEPHIN)  IV Stopped (12/17/19 1901)    LOS: 4 days   Kerney Elbe, DO [facility] PAGER is on [place]  If 7PM-7AM, please contact night-coverage [email or web address]

## 2019-12-18 NOTE — Evaluation (Signed)
Occupational Therapy Evaluation Patient Details Name: Natasha [name] MRN: [id number] DOB: [birth year] Today's Date: 12/18/2019    History of Present Illness Natasha [name] is a 86 y.o. female with medical history significant for diastolic heart failure, NYHA class III, anemia, history of cellulitis and abscess of the leg in 2015, chronic obstructive asthma, COPD, CAD of the native coronary artery, diverticulosis, GERD, hypertension, hyperlipidemia, osteoarthritis, osteoporosis, peripheral vascular disease with claudication, restrictive airway, status post above-the-knee amputation of the lower extremity, trigeminal neuralgia, type II non-insulin-dependent diabetes mellitus, former tobacco user, history of anal fissurectomy, rec extraction status post cochlear implant on 04/2007, presented to the emergency department for chief concerns of severe shortness of breath with musculoskeletal chest and back pain.   Clinical Impression   Pt seen for OT evaluation this date in setting of hospitalization with SOB and pain. Pt reports being able to transfer via slide-board to motorized chair at baseline, but unclear how much assist has recently been needed. Pt endorses needing more help in recent weeks/months. Pt presented to ED from [facility], but reports she lives with husband at baseline. This date, pt presents grossly weak with decreased fxl activity tolerance. Pt requires MAX A +2 to scoot transfer with draw sheet from chair to bed with use of bed rail. Pt requires SETUP to MIN a for bed level UB ADLs including bathing and dressing and MAX A for LB ADLs at this time d/t tender R LE (L LE with AKA). Anticipate pt will require continued rehabilitation at SNF as well as OT to follow while she is in acute setting. Pt tolerates well overall, but does c/o pain, RN notified. Left with all needs met and in reach, bed alarm on.    Follow Up Recommendations  SNF    Equipment Recommendations  Other (comment)  (defer to next level of care)    Recommendations for Other Services       Precautions / Restrictions Precautions Precautions: Fall Precaution Comments: patient has L AKA, right leg is very tender Restrictions Weight Bearing Restrictions: No      Mobility Bed Mobility Overal bed mobility: Needs Assistance Bed Mobility: Sit to Supine     Supine to sit: Mod assist Sit to supine: Min assist;+2 for safety/equipment   General bed mobility comments: requires use of rail and assist to maintain sitting balance at edge of bed    Transfers Overall transfer level: Needs assistance Equipment used: None Transfers: Lateral/Scoot Transfers          Lateral/Scoot Transfers: Max assist;+2 physical assistance General transfer comment: max +2 lateral scoot transfer from recliner back to bed d/t slight incline from chair to bed. Requires use of bed rails, increased time, chair pad underneath used to help transition/decrease friction    Balance Overall balance assessment: Needs assistance Sitting-balance support: Bilateral upper extremity supported Sitting balance-Leahy Scale: Poor Sitting balance - Comments: requires assist to maintain sitting balance Postural control: Posterior lean     Standing balance comment: pt cannot tolerate putting wt through R LE at this time d/t pain.                           ADL either performed or assessed with clinical judgement   ADL Overall ADL's : Needs assistance/impaired  General ADL Comments: Pt requires SETUP/MIN A with bed level (sitting in fowler's with HOB elevated) and MOD/MAX A with seated LB ADLs d/t tender R LE.     Vision Baseline Vision/History: Wears glasses Patient Visual Report: No change from baseline Additional Comments: tracks appropriately throughout. Eyes noted to be watering periodically.     Perception     Praxis      Pertinent Vitals/Pain Pain  Assessment: Faces Faces Pain Scale: Hurts whole lot Pain Location: R LE with mobilizing and lower back with sitting and supine Pain Descriptors / Indicators: Discomfort;Sore;Grimacing Pain Intervention(s): Limited activity within patient's tolerance;Monitored during session;Repositioned     Hand Dominance     Extremity/Trunk Assessment Upper Extremity Assessment Upper Extremity Assessment: Generalized weakness;RUE deficits/detail;LUE deficits/detail RUE Deficits / Details: ROM WFL, MMT grossly 4-/5 LUE Deficits / Details: shld ROM limited d/t h/o rotator cuff injury per pt. Pt completes flexion to 1/2 range. MMT of elbow/grip grossly 3+/5   Lower Extremity Assessment Lower Extremity Assessment: Generalized weakness;RLE deficits/detail RLE Deficits / Details: patient's right LE painful to touch, Left is AKA RLE: Unable to fully assess due to pain   Cervical / Trunk Assessment Cervical / Trunk Assessment: Kyphotic   Communication Communication Communication: HOH   Cognition Arousal/Alertness: Awake/alert Behavior During Therapy: WFL for tasks assessed/performed Overall Cognitive Status: Difficult to assess                                 General Comments: pt mostly appropriate, some delayed reposnses, but potentially related to hearing.   General Comments       Exercises Other Exercises Other Exercises: OT facilitates ed re: role of OT in acute setting, benefits of OOB activities, importance of engaging in ADLs with as much INDEP as possible. Pt with moderate understanding demonstrated at this time.   Shoulder Instructions      Home Living Family/patient expects to be discharged to:: Skilled nursing facility                                 Additional Comments: pt here from [facility] "Healthcare" side-SNF      Prior Functioning/Environment Level of Independence: Needs assistance  Gait / Transfers Assistance Needed: Reports she lives with  husband and usually transfers to a motorized chair via slideboard, unclear how much assist needed at baseline, she endorses needing assist in recent months ADL's / Homemaking Assistance Needed: pt reports she's required increased assist for basic self care since transferring to SNF portion of TL   Comments: lives at home with husband transfers via slide board to motorized wheelchair.        OT Problem List: Decreased strength;Decreased range of motion;Decreased activity tolerance;Impaired balance (sitting and/or standing);Cardiopulmonary status limiting activity;Impaired UE functional use;Pain      OT Treatment/Interventions: Self-care/ADL training;DME and/or AE instruction;Therapeutic activities;Balance training;Therapeutic exercise;Energy conservation;Patient/family education    OT Goals(Current goals can be found in the care plan section) Acute Rehab OT Goals Patient Stated Goal: to get pain under control and move better OT Goal Formulation: With patient Time For Goal Achievement: 01/01/20 Potential to Achieve Goals: Fair ADL Goals Pt Will Perform Grooming: with set-up;with supervision;sitting Pt Will Perform Upper Body Bathing: with min assist;sitting Pt Will Perform Upper Body Dressing: with set-up;sitting (sitting EOB/EOC to improve sitting activity tolerance) Pt Will Transfer to Toilet: with min assist;with transfer board;bedside  commode (with drop arm) Pt/caregiver will Perform Home Exercise Program: Increased strength;Both right and left upper extremity;With minimal assist (mindful of L shoulder issues)  OT Frequency: Min 1X/week   Barriers to D/C:            Co-evaluation PT/OT/SLP Co-Evaluation/Treatment: Yes Reason for Co-Treatment: Complexity of the patient's impairments (multi-system involvement);For patient/therapist safety;To address functional/ADL transfers PT goals addressed during session: Mobility/safety with mobility OT goals addressed during session: ADL's and  self-care      AM-PAC OT "6 Clicks" Daily Activity     Outcome Measure Help from another person eating meals?: None Help from another person taking care of personal grooming?: A Little Help from another person toileting, which includes using toliet, bedpan, or urinal?: A Lot Help from another person bathing (including washing, rinsing, drying)?: A Lot Help from another person to put on and taking off regular upper body clothing?: A Little Help from another person to put on and taking off regular lower body clothing?: Total 6 Click Score: 15   End of Session Nurse Communication: Mobility status  Activity Tolerance: Patient tolerated treatment well;Patient limited by pain Patient left: in bed;with call bell/phone within reach;with bed alarm set  OT Visit Diagnosis: Other abnormalities of gait and mobility (R26.89);Muscle weakness (generalized) (M62.81);Pain Pain - Right/Left: Right Pain - part of body: Leg                Time: 1460-4799 OT Time Calculation (min): 32 min Charges:  OT General Charges $OT Visit: 1 Visit OT Evaluation $OT Eval Moderate Complexity: 1 Mod OT Treatments $Self Care/Home Management : 8-22 mins  Gerrianne Scale, MS, OTR/L ascom [phone] 12/18/19, 3:43 PM

## 2019-12-18 NOTE — Evaluation (Signed)
Physical Therapy Evaluation Patient Details Name: Natasha [name] MRN: [id number] DOB: 18-Mar-1928 Today's Date: 12/18/2019   History of Present Illness  Natasha [name] is a 86 y.o. female with medical history significant for diastolic heart failure, NYHA class III, anemia, history of cellulitis and abscess of the leg in 2015, chronic obstructive asthma, COPD, CAD of the native coronary artery, diverticulosis, GERD, hypertension, hyperlipidemia, osteoarthritis, osteoporosis, peripheral vascular disease with claudication, restrictive airway, status post above-the-knee amputation of the lower extremity, trigeminal neuralgia, type II non-insulin-dependent diabetes mellitus, former tobacco user, history of anal fissurectomy, rec extraction status post cochlear implant on 04/2007, presented to the emergency department for chief concerns of severe shortness of breath with musculoskeletal chest and back pain.  Clinical Impression  Patient agreeable to PT session and would like to get up to recliner if able. She usually uses slide board to transfer. She requires mod assist with supine to sit and mod assist to maintain sitting balance. She is unable to effectively use slide board with equipment at hospital, therefore was assisted with max assist to perform lateral transfer to recliner from bed. Patient will continue to benefit from skilled PT while here to maintain function and strength. She may need STR prior to return home.      Follow Up Recommendations SNF    Equipment Recommendations  None recommended by PT    Recommendations for Other Services       Precautions / Restrictions Precautions Precautions: Fall Restrictions Weight Bearing Restrictions: No      Mobility  Bed Mobility Overal bed mobility: Needs Assistance Bed Mobility: Supine to Sit     Supine to sit: Mod assist     General bed mobility comments: requires use of rail and assist to maintain sitting balance at edge of  bed    Transfers Overall transfer level: Needs assistance Equipment used: None Transfers: Lateral/Scoot Transfers          Lateral/Scoot Transfers: Max assist General transfer comment: attempted to use slide board to scoot laterally to recliner, but patient was unable to reach far arm rest to assist. Ended up performing max assist lateral scoot to recliner.  Ambulation/Gait             General Gait Details: patient is not ambulatory at baseline.  Stairs            Wheelchair Mobility    Modified Rankin (Stroke Patients Only)       Balance Overall balance assessment: Needs assistance Sitting-balance support: Feet unsupported;Bilateral upper extremity supported Sitting balance-Leahy Scale: Poor Sitting balance - Comments: requires assist to maintain sitting balance Postural control: Posterior lean                                   Pertinent Vitals/Pain      Home Living Family/patient expects to be discharged to:: Unsure                      Prior Function Level of Independence: Independent with assistive device(s)         Comments: lives at home with husband transfers via slide board to motorized wheelchair.     Hand Dominance        Extremity/Trunk Assessment   Upper Extremity Assessment Upper Extremity Assessment: Generalized weakness    Lower Extremity Assessment Lower Extremity Assessment: Generalized weakness;RLE deficits/detail RLE Deficits / Details: patient's right LE painful to  touch, Left is AKA RLE: Unable to fully assess due to pain    Cervical / Trunk Assessment Cervical / Trunk Assessment: Kyphotic  Communication   Communication: HOH  Cognition Arousal/Alertness: Awake/alert Behavior During Therapy: WFL for tasks assessed/performed Overall Cognitive Status: Within Functional Limits for tasks assessed                                        General Comments      Exercises      Assessment/Plan    PT Assessment Patient needs continued PT services  PT Problem List Decreased strength;Decreased activity tolerance;Decreased mobility;Decreased balance;Cardiopulmonary status limiting activity;Pain       PT Treatment Interventions Functional mobility training;Therapeutic activities;Balance training;Patient/family education    PT Goals (Current goals can be found in the Care Plan section)  Acute Rehab PT Goals Patient Stated Goal: none stated PT Goal Formulation: With patient Time For Goal Achievement: 01/01/20 Potential to Achieve Goals: Fair    Frequency Min 2X/week   Barriers to discharge        Co-evaluation               AM-PAC PT "6 Clicks" Mobility  Outcome Measure Help needed turning from your back to your side while in a flat bed without using bedrails?: A Lot Help needed moving from lying on your back to sitting on the side of a flat bed without using bedrails?: A Lot Help needed moving to and from a bed to a chair (including a wheelchair)?: A Lot Help needed standing up from a chair using your arms (e.g., wheelchair or bedside chair)?: Total Help needed to walk in hospital room?: Total Help needed climbing 3-5 steps with a railing? : Total 6 Click Score: 9    End of Session Equipment Utilized During Treatment: Gait belt;Oxygen Activity Tolerance: Patient tolerated treatment well;Patient limited by pain;Patient limited by fatigue Patient left: in chair;with call bell/phone within reach Nurse Communication: Mobility status PT Visit Diagnosis: Muscle weakness (generalized) (M62.81);Other abnormalities of gait and mobility (R26.89);Pain Pain - Right/Left: Right    Time: 0141-0301 PT Time Calculation (min) (ACUTE ONLY): 16 min   Charges:   PT Evaluation $PT Eval Moderate Complexity: 1 Mod          Clotine Heiner, PT, GCS 12/18/19,12:38 PM

## 2019-12-18 NOTE — Progress Notes (Signed)
Physical Therapy Treatment Patient Details Name: Natasha [name] MRN: [id number] DOB: 1928/02/24 Today's Date: 12/18/2019    History of Present Illness Natasha [name] is a 86 y.o. female with medical history significant for diastolic heart failure, NYHA class III, anemia, history of cellulitis and abscess of the leg in 2015, chronic obstructive asthma, COPD, CAD of the native coronary artery, diverticulosis, GERD, hypertension, hyperlipidemia, osteoarthritis, osteoporosis, peripheral vascular disease with claudication, restrictive airway, status post above-the-knee amputation of the lower extremity, trigeminal neuralgia, type II non-insulin-dependent diabetes mellitus, former tobacco user, history of anal fissurectomy, rec extraction status post cochlear implant on 04/2007, presented to the emergency department for chief concerns of severe shortness of breath with musculoskeletal chest and back pain.    PT Comments    Patient received in recliner. She is ready to get back into bed. She requires a lot of assistance and set up to perform lateral scoot transfer. Requires max +2 assist to perform. Cues for hand placement to assist as able. She has difficult time with transfers here, unsure how much better her transfers would be in her own environment with her own equipment. Therefore hard to say how much help she will need at home. She may benefit from short rehab stay to be safe. She will continue to benefit from skilled PT while here to maintain mobility and strength.        Follow Up Recommendations  SNF     Equipment Recommendations  None recommended by PT    Recommendations for Other Services       Precautions / Restrictions Precautions Precautions: Fall Precaution Comments: patient has L AKA, right leg is very tender Restrictions Weight Bearing Restrictions: No    Mobility  Bed Mobility Overal bed mobility: Needs Assistance Bed Mobility: Sit to Supine     Supine to sit:  Mod assist Sit to supine: Min assist;+2 for physical assistance   General bed mobility comments: requires use of rail and assist to maintain sitting balance at edge of bed  Transfers Overall transfer level: Needs assistance Equipment used: None Transfers: Lateral/Scoot Transfers          Lateral/Scoot Transfers: Max assist;+2 physical assistance General transfer comment: max +2 lateral scoot transfer from recliner back to bed.  Ambulation/Gait             General Gait Details: patient is not ambulatory at baseline.   Stairs             Wheelchair Mobility    Modified Rankin (Stroke Patients Only)       Balance Overall balance assessment: Needs assistance Sitting-balance support: Bilateral upper extremity supported Sitting balance-Leahy Scale: Poor Sitting balance - Comments: requires assist to maintain sitting balance Postural control: Posterior lean                                  Cognition Arousal/Alertness: Awake/alert Behavior During Therapy: WFL for tasks assessed/performed Overall Cognitive Status: Within Functional Limits for tasks assessed                                        Exercises      General Comments        Pertinent Vitals/Pain Pain Assessment: Faces Faces Pain Scale: Hurts whole lot Pain Location: R LE with mobility/touching Pain Descriptors / Indicators: Discomfort;Sore;Grimacing Pain  Intervention(s): Limited activity within patient's tolerance;Monitored during session;Repositioned    Home Living Family/patient expects to be discharged to:: Unsure                    Prior Function Level of Independence: Independent with assistive device(s)      Comments: lives at home with husband transfers via slide board to motorized wheelchair.   PT Goals (current goals can now be found in the care plan section) Acute Rehab PT Goals Patient Stated Goal: none stated PT Goal Formulation: With  patient Time For Goal Achievement: 01/01/20 Potential to Achieve Goals: Fair Progress towards PT goals: Progressing toward goals    Frequency    Min 2X/week      PT Plan Current plan remains appropriate    Co-evaluation              AM-PAC PT "6 Clicks" Mobility   Outcome Measure  Help needed turning from your back to your side while in a flat bed without using bedrails?: A Lot Help needed moving from lying on your back to sitting on the side of a flat bed without using bedrails?: A Lot Help needed moving to and from a bed to a chair (including a wheelchair)?: A Lot Help needed standing up from a chair using your arms (e.g., wheelchair or bedside chair)?: Total Help needed to walk in hospital room?: Total Help needed climbing 3-5 steps with a railing? : Total 6 Click Score: 9    End of Session Equipment Utilized During Treatment: Oxygen Activity Tolerance: Patient tolerated treatment well;Patient limited by pain;Patient limited by fatigue Patient left: in bed;Other (comment) (with OT in room) Nurse Communication: Mobility status PT Visit Diagnosis: Muscle weakness (generalized) (M62.81);Other abnormalities of gait and mobility (R26.89);Pain Pain - Right/Left: Right     Time: 2376-2831 PT Time Calculation (min) (ACUTE ONLY): 8 min  Charges:  $Therapeutic Activity: 8-22 mins                     Felipe Cabell, PT, GCS 12/18/19,2:57 PM

## 2019-12-18 NOTE — TOC Initial Note (Signed)
Transition of Care [facility]) - Initial/Assessment Note    Patient Details  Name: Natasha [name] MRN: [id number] Date of Birth: 05/06/1928  [facility] CM/SW Contact:    Shelbie Hutching, RN Phone Number: 12/18/2019, 1:42 PM  Clinical Narrative:                 Patient admitted to the hospital with community acquired pneumonia.  RNCM met with patient at the bedside this morning.  Patient is very hard of hearing and RNCM had to communicate by writing on board at the beside.  Patient is from [facility].  Patient is on chronic O2 at 2L.  Patient gives RNCM permission to speak with her daughter, Nevin Bloodgood at [phone], and friend Jinny Sanders.  Plan for discharge is for patient to return to SNF at [facility].   Expected Discharge Plan: [place] Barriers to Discharge: Continued Medical Work up   Patient Goals and CMS Choice Patient states their goals for this hospitalization and ongoing recovery are:: From [place] will return to [facility] SNF CMS Medicare.gov Compare Post Acute Care list provided to:: Patient Choice offered to / list presented to : Patient  Expected Discharge Plan and Services Expected Discharge Plan: [place]   Discharge Planning Services: CM Consult Post Acute Care Choice: [place] Living arrangements for the past 2 months: [place]                 DME Arranged: N/A DME Agency: NA       HH Arranged: NA [place] Agency: NA        Prior Living Arrangements/Services Living arrangements for the past 2 months: [place] Lives with:: Facility Resident Patient language and need for interpreter reviewed:: Yes Do you feel safe going back to the place where you live?: Yes      Need for Family Participation in Patient Care: Yes (Comment) (lives at [facility]) Care giver support system in place?: Yes (comment) (daughter and friend)   Criminal Activity/Legal Involvement Pertinent to  Current Situation/Hospitalization: No - Comment as needed  Activities of Daily Living Home Assistive Devices/Equipment: Wheelchair ADL Screening (condition at time of admission) Patient's cognitive ability adequate to safely complete daily activities?: Yes Is the patient deaf or have difficulty hearing?: Yes Does the patient have difficulty seeing, even when wearing glasses/contacts?: No Does the patient have difficulty concentrating, remembering, or making decisions?: No Patient able to express need for assistance with ADLs?: Yes Does the patient have difficulty dressing or bathing?: Yes Independently performs ADLs?: No Communication: Independent Dressing (OT): Needs assistance Is this a change from baseline?: Pre-admission baseline Grooming: Needs assistance Is this a change from baseline?: Pre-admission baseline Feeding: Needs assistance Is this a change from baseline?: Pre-admission baseline Bathing: Needs assistance Is this a change from baseline?: Pre-admission baseline Toileting: Needs assistance Is this a change from baseline?: Pre-admission baseline In/Out Bed: Needs assistance Is this a change from baseline?: Pre-admission baseline Walks in Home: Needs assistance Is this a change from baseline?: Pre-admission baseline Does the patient have difficulty walking or climbing stairs?: Yes Weakness of Legs: Right Weakness of Arms/Hands: Left  Permission Sought/Granted Permission sought to share information with : Case Manager, Family Supports, Chartered certified accountant granted to share information with : Yes, Verbal Permission Granted  Share Information with NAME: Nevin Bloodgood and [place] granted to share info w AGENCY: [facility] granted to share info w Relationship: daughter  and friend     Emotional Assessment Appearance:: Appears stated age Attitude/Demeanor/Rapport: Engaged Affect (typically observed): Accepting Orientation: :  Oriented to Self, Oriented to Place, Oriented to  Time, Oriented to Situation Alcohol / Substance Use: Not Applicable Psych Involvement: No (comment)  Admission diagnosis:  Hypoxia [R09.02] CAP (community acquired pneumonia) [J18.9] Dyspnea, unspecified type [R06.00] Patient Active Problem List   Diagnosis Date Noted  . Arterial occlusion 01/14/2018  . Sacral fracture ([place]) 11/15/2016  . HTN (hypertension) 11/15/2016  . GERD (gastroesophageal reflux disease) 11/15/2016  . RLS (restless legs syndrome) 11/15/2016  . Asthma 11/15/2016  . Back pain 10/29/2016  . CAP (community acquired pneumonia) 03/27/2016  . Cellulitis 03/27/2016  . Hypokalemia 03/27/2016  . Acute respiratory failure ([place]) 03/27/2016  . Sepsis ([place]) 08/24/2015  . HCAP (healthcare-associated pneumonia) 06/27/2015  . Pneumonia 06/07/2015  . Hypotension 03/11/2015  . Lower extremity atheroembolism ([place]) 02/27/2015  . Ischemic leg 02/27/2015  . Calculus of gallbladder with other cholecystitis, without mention of obstruction 04/20/2013   PCP:  Rusty Aus, MD Pharmacy:   [id number] [place], [place]Phone: [phone]: [address]Phone: [phone]: [phone]    Social Determinants of Health (SDOH) Interventions    Readmission Risk Interventions No flowsheet data found.

## 2019-12-19 ENCOUNTER — Inpatient Hospital Stay
Admit: 2019-12-19 | Discharge: 2019-12-19 | Disposition: A | Discharge status: Home / Self Care | Payer: Medicare Other | Attending: Internal Medicine | Admitting: Internal Medicine

## 2019-12-19 ENCOUNTER — Inpatient Hospital Stay: Payer: Medicare Other

## 2019-12-19 DIAGNOSIS — R0989 Other specified symptoms and signs involving the circulatory and respiratory systems: Secondary | ICD-10-CM

## 2019-12-19 DIAGNOSIS — J9811 Atelectasis: Secondary | ICD-10-CM

## 2019-12-19 DIAGNOSIS — R0602 Shortness of breath: Secondary | ICD-10-CM

## 2019-12-19 DIAGNOSIS — K449 Diaphragmatic hernia without obstruction or gangrene: Secondary | ICD-10-CM

## 2019-12-19 LAB — CBC WITH DIFFERENTIAL/PLATELET
Abs Immature Granulocytes: 0.09 10*3/uL — ABNORMAL HIGH (ref 0.00–0.07)
Basophils Absolute: 0 10*3/uL (ref 0.0–0.1)
Basophils Relative: 0 %
Eosinophils Absolute: 0 10*3/uL (ref 0.0–0.5)
Eosinophils Relative: 0 %
HCT: 30.3 % — ABNORMAL LOW (ref 36.0–46.0)
Hemoglobin: 9.4 g/dL — ABNORMAL LOW (ref 12.0–15.0)
Immature Granulocytes: 1 %
Lymphocytes Relative: 4 %
Lymphs Abs: 0.4 10*3/uL — ABNORMAL LOW (ref 0.7–4.0)
MCH: 27.3 pg (ref 26.0–34.0)
MCHC: 31 g/dL (ref 30.0–36.0)
MCV: 88.1 fL (ref 80.0–100.0)
Monocytes Absolute: 0.3 10*3/uL (ref 0.1–1.0)
Monocytes Relative: 3 %
Neutro Abs: 9.2 10*3/uL — ABNORMAL HIGH (ref 1.7–7.7)
Neutrophils Relative %: 92 %
Platelets: 290 10*3/uL (ref 150–400)
RBC: 3.44 MIL/uL — ABNORMAL LOW (ref 3.87–5.11)
RDW: 17.3 % — ABNORMAL HIGH (ref 11.5–15.5)
WBC: 9.9 10*3/uL (ref 4.0–10.5)
nRBC: 0 % (ref 0.0–0.2)

## 2019-12-19 LAB — COMPREHENSIVE METABOLIC PANEL
ALT: 17 U/L (ref 0–44)
AST: 18 U/L (ref 15–41)
Albumin: 2.9 g/dL — ABNORMAL LOW (ref 3.5–5.0)
Alkaline Phosphatase: 56 U/L (ref 38–126)
Anion gap: 9 (ref 5–15)
BUN: 38 mg/dL — ABNORMAL HIGH (ref 8–23)
CO2: 33 mmol/L — ABNORMAL HIGH (ref 22–32)
Calcium: 9.6 mg/dL (ref 8.9–10.3)
Chloride: 95 mmol/L — ABNORMAL LOW (ref 98–111)
Creatinine, Ser: 0.83 mg/dL (ref 0.44–1.00)
GFR, Estimated: >60 mL/min (ref >60)
Glucose, Bld: 156 mg/dL — ABNORMAL HIGH (ref 70–99)
Potassium: 4.2 mmol/L (ref 3.5–5.1)
Sodium: 137 mmol/L (ref 135–145)
Total Bilirubin: 0.5 mg/dL (ref 0.3–1.2)
Total Protein: 5.9 g/dL — ABNORMAL LOW (ref 6.5–8.1)

## 2019-12-19 LAB — CULTURE, BLOOD (ROUTINE X 2)
Culture: NO GROWTH
Culture: NO GROWTH
Special Requests: ADEQUATE
Special Requests: ADEQUATE

## 2019-12-19 LAB — ECHOCARDIOGRAM COMPLETE
Height: 59 in
S' Lateral: 2.04 cm

## 2019-12-19 LAB — MAGNESIUM: Magnesium: 2.3 mg/dL (ref 1.7–2.4)

## 2019-12-19 LAB — PHOSPHORUS: Phosphorus: 2.6 mg/dL (ref 2.5–4.6)

## 2019-12-19 MED ORDER — PREDNISONE 20 MG PO TABS
40.0000 mg | ORAL_TABLET | Freq: Every day | ORAL | Status: DC
  Administered 2019-12-20: 09:00:00 40 mg via ORAL
  Filled 2019-12-19: qty 2

## 2019-12-19 NOTE — Progress Notes (Signed)
*  PRELIMINARY RESULTS* Echocardiogram 2D Echocardiogram has been performed.  Natasha [name] 12/19/2019, 2:07 PM

## 2019-12-19 NOTE — Progress Notes (Signed)
Responded to consult for IV. On arrival, RN stated IV obtained.

## 2019-12-19 NOTE — Progress Notes (Signed)
PROGRESS NOTE    Natasha [name]  SAY:[id number]  DOB: 1928-11-15  DOA: 12/31/2019 PCP: Venia Carbon, MD Outpatient Specialists:   Hospital course:  86 year old female with PMH significant for HFpEF class III, COPD, DM 2, s/p AKA, HTN, CAD, diverticulosis and GERD was sent over from her pulmonologist Dr. Zoila Shutter office due to worsening respiratory status.  Patient has been coughing for the past 2 to 3 days with difficulty clearing her mucus and shortness of breath not improved with inhaler.  In the ED patient was noted to have increased work of breathing but able to maintain O2 saturations on 6 L nasal cannula and abnormal CT.  CTA was negative for PE but did show possible left base opacity suggestive of pneumonia.  Patient was noted be afebrile but did have leukocytosis at 16.  She was admitted for COPD exacerbation with community-acquired pneumonia.  She was started on ceftriaxone azithromycin.  CXR on 12/18/2019 and 12/19/2019 continue to demonstrate pulmonary vascular congestion, atelectasis and large hiatal hernia. Subjective: The patient's cough is improved, but she continues to have severe dyspnea with conversation.   Objective: Vitals:   12/19/19 0606 12/19/19 0735 12/19/19 0757 12/19/19 1119  BP: (!) 146/63 (!) 168/78  127/69  Pulse: 79 86  82  Resp: 19 19  19   Temp: 97.7 F (36.5 C) 97.7 F (36.5 C)  98.4 F (36.9 C)  TempSrc:  Oral  Oral  SpO2: 96% 96% 92% 95%  Height:        Intake/Output Summary (Last 24 hours) at 12/19/2019 1237 Last data filed at 12/19/2019 0542 Gross per 24 hour  Intake --  Output 500 ml  Net -500 ml   There were no vitals filed for this visit.  Exam:  Constitutional:  The patient is awake, alert, and oriented x 3. She continues to have pleuritic chest pain with deep respiration.  Respiratory:  . No increased work of breathing. Marland Kitchen Positive for wheezes and rhonchi. No rales . No tactile fremitus Cardiovascular:  . Regular rate  and rhythm . No murmurs, ectopy, or gallups. . No lateral PMI. No thrills. Abdomen:  . Abdomen is soft, non-tender, non-distended . No hernias, masses, or organomegaly . Normoactive bowel sounds.  Musculoskeletal:  . No cyanosis, clubbing, or edema Skin:  . No rashes, lesions, ulcers . palpation of skin: no induration or nodules Neurologic:  . CN 2-12 intact . Sensation all 4 extremities intact Psychiatric:  . Mental status o Mood, affect appropriate o Orientation to person, place, time  . judgment and insight appear intact  Assessment & Plan:   Acute on chronic respiratory failure secondary to CAP and COPD exacerbation and pulmonary vascular congestion: Improving slowly. Will check Echocardiogram as CXR continues to demonstrate plmonary vascular congestion. Also continue inhaled bronchodilator therapy, and steroids. She has completed 5 days of IV antibiotics for CAP.  Tessalon Perles and low dose Oxycodone for cough suppressant are helpful as well. Continue DuoNebs every 6 hours while awake. Continue diuresis. - 2.5 L with regard to fluid balance. Mucomyst nebulizer to help with expectoration. Taper steroids.  Pleuritic chest pain: Improved somewhat, but still present. Not helped by oxycodone yesterday. I have started a low dose of toradol q 6 to help with this. Will only continue this for a couple of days as the patient is also on steroids. Wean steroids.  Hyponatremia: Resolved. Possible some level of SIADH and/or intravascular fluid depletion given increased insensible losses.Continue to hold metolazone.  Chronic pain with  new acute pleuriticchest pain with coughing Oxycodone 2.5 mg every 8 hours was added as needed for chest pain with coughing. Not really helping. Toradol added and seems to help.   HFpEF Pulmonary vascular congestion on CXR. Check echocardiogram. Continue oral lasix and spironolactone. Continue Lasix and spironolactone per home dose. Monitor volume status. The  patient is currently negative 2.574 in terms of fluid balance.  PVD Patient continues on Eliquis and aspirin per home doses  Anxiety and depression Continue escitalopram and lorazepam per home doses  Hyperlipidemia: Continue simvastatin  I have seen and examined this patient myself. I have spent 38 minutes in her evaluation and care.  DVT prophylaxis: On Eliquis Code Status: Full code Family Communication: None Disposition Plan:   Patient is from: Home  Anticipated Discharge Location: Home  Barriers to Discharge: Continued resiratory distress and pleuritic chest pain.  Is patient medically stable for Discharge: No  Consultants:  None  Procedures:  None  Antimicrobials:  Ceftriaxone started 12/21/2019  Azithromycin started 12/31/2019   Data Reviewed:  Basic Metabolic Panel: Recent Labs  Lab 12/15/19 0515 12/16/19 0501 12/17/19 0429 12/18/19 0832 12/19/19 0525  NA 130* 131* 136 133* 137  K 3.4* 4.3 4.7 4.4 4.2  CL 88* 91* 94* 91* 95*  CO2 30 30 34* 31 33*  GLUCOSE 162* 145* 114* 183* 156*  BUN 23 24* 21 28* 38*  CREATININE 0.92 0.82 0.74 0.76 0.83  CALCIUM 9.2 9.3 9.6 9.3 9.6  MG  --   --   --  2.4 2.3  PHOS  --   --   --  2.6 2.6   Liver Function Tests: Recent Labs  Lab 12/19/2019 1020 12/15/19 0515 12/18/19 0832 12/19/19 0525  AST 24 25 19 18   ALT 18 18 18 17   ALKPHOS 86 75 72 56  BILITOT 0.7 0.6 0.3 0.5  PROT 7.3 6.5 6.3* 5.9*  ALBUMIN 3.4* 2.9* 3.1* 2.9*   No results for input(s): LIPASE, AMYLASE in the last 168 hours. No results for input(s): AMMONIA in the last 168 hours. CBC: Recent Labs  Lab 12/16/2019 1020 12/27/2019 1020 12/15/19 0515 12/16/19 0501 12/17/19 0429 12/18/19 0832 12/19/19 0525  WBC 16.2*   < > 11.0* 10.9* 12.3* 11.5* 9.9  NEUTROABS 13.8*  --   --  9.6* 9.4* 10.3* 9.2*  HGB 12.2   < > 10.7* 10.3* 10.7* 11.1* 9.4*  HCT 39.1   < > 34.5* 32.7* 34.0* 35.0* 30.3*  MCV 85.9   < > 87.6 86.3 86.3 86.6 88.1  PLT 387   < > 307  301 300 320 290   < > = values in this interval not displayed.   Cardiac Enzymes: No results for input(s): CKTOTAL, CKMB, CKMBINDEX, TROPONINI in the last 168 hours. BNP (last 3 results) No results for input(s): PROBNP in the last 8760 hours. CBG: No results for input(s): GLUCAP in the last 168 hours.  Recent Results (from the past 240 hour(s))  Resp Panel by RT-PCR (Flu A&B, Covid) Nasopharyngeal Swab     Status: None   Collection Time: 12/23/2019  3:35 PM   Specimen: Nasopharyngeal Swab; Nasopharyngeal(NP) swabs in vial transport medium  Result Value Ref Range Status   SARS Coronavirus 2 by RT PCR NEGATIVE NEGATIVE Final    Comment: (NOTE) SARS-CoV-2 target nucleic acids are NOT DETECTED.  The SARS-CoV-2 RNA is generally detectable in upper respiratory specimens during the acute phase of infection. The lowest concentration of SARS-CoV-2 viral copies this assay can detect  is 138 copies/mL. A negative result does not preclude SARS-Cov-2 infection and should not be used as the sole basis for treatment or other patient management decisions. A negative result may occur with  improper specimen collection/handling, submission of specimen other than nasopharyngeal swab, presence of viral mutation(s) within the areas targeted by this assay, and inadequate number of viral copies(<138 copies/mL). A negative result must be combined with clinical observations, patient history, and epidemiological information. The expected result is Negative.  Fact Sheet for Patients:  EntrepreneurPulse.com.au  Fact Sheet for Healthcare Providers:  IncredibleEmployment.be  This test is no t yet approved or cleared by the [place] FDA and  has been authorized for detection and/or diagnosis of SARS-CoV-2 by FDA under an Emergency Use Authorization (EUA). This EUA will remain  in effect (meaning this test can be used) for the duration of the COVID-19 declaration under  Section 564(b)(1) of the Act, 21 U.S.C.section 360bbb-3(b)(1), unless the authorization is terminated  or revoked sooner.       Influenza A by PCR NEGATIVE NEGATIVE Final   Influenza B by PCR NEGATIVE NEGATIVE Final    Comment: (NOTE) The Xpert Xpress SARS-CoV-2/FLU/RSV plus assay is intended as an aid in the diagnosis of influenza from Nasopharyngeal swab specimens and should not be used as a sole basis for treatment. Nasal washings and aspirates are unacceptable for Xpert Xpress SARS-CoV-2/FLU/RSV testing.  Fact Sheet for Patients: EntrepreneurPulse.com.au  Fact Sheet for Healthcare Providers: IncredibleEmployment.be  This test is not yet approved or cleared by the [place] FDA and has been authorized for detection and/or diagnosis of SARS-CoV-2 by FDA under an Emergency Use Authorization (EUA). This EUA will remain in effect (meaning this test can be used) for the duration of the COVID-19 declaration under Section 564(b)(1) of the Act, 21 U.S.C. section 360bbb-3(b)(1), unless the authorization is terminated or revoked.  Performed at [facility], [address] [place], [place] [address]   MRSA PCR Screening     Status: Abnormal   Collection Time: 01/08/2020  5:02 PM   Specimen: Nasal Mucosa; Nasopharyngeal  Result Value Ref Range Status   MRSA by PCR POSITIVE (A) NEGATIVE Final    Comment:        The GeneXpert MRSA Assay (FDA approved for NASAL specimens only), is one component of a comprehensive MRSA colonization surveillance program. It is not intended to diagnose MRSA infection nor to guide or monitor treatment for MRSA infections. RESULT CALLED TO, READ BACK BY AND VERIFIED WITH: WILACYNC STOVER 12/15/19 AT 2000 BY ACR Performed at [facility], [address] [place], [place] [address]   Blood culture (routine x 2)     Status: None   Collection Time: 12/21/2019  5:28 PM   Specimen: BLOOD  Result  Value Ref Range Status   Specimen Description BLOOD LEFT FOREARM  Final   Special Requests   Final    BOTTLES DRAWN AEROBIC AND ANAEROBIC Blood Culture adequate volume   Culture   Final    NO GROWTH 5 DAYS Performed at [facility], [address] [place], [place] [address]    Report Status 12/19/2019 FINAL  Final  Blood culture (routine x 2)     Status: None   Collection Time: 12/16/2019  5:28 PM   Specimen: BLOOD  Result Value Ref Range Status   Specimen Description BLOOD RIGHT ASSIST CONTROL  Final   Special Requests   Final    BOTTLES DRAWN AEROBIC AND ANAEROBIC Blood Culture adequate  volume   Culture   Final    NO GROWTH 5 DAYS Performed at [facility], [address] [place], [place] [address]    Report Status 12/19/2019 FINAL  Final      Studies: DG Chest 1 View  Result Date: 12/19/2019 CLINICAL DATA:  Shortness of breath EXAM: CHEST  1 VIEW COMPARISON:  Yesterday FINDINGS: Intrathoracic stomach at the right base with overlying pulmonary opacity by CT. Volume loss and streaky density at the left base which is non progressed. No edema, effusion, or pneumothorax. Normal heart size. IMPRESSION: 1. Stable from yesterday. 2. Airway opacity and atelectasis at the lower lobes by recent chest CT. Electronically Signed   By: Monte Fantasia M.D.   On: 12/19/2019 07:33   DG Chest 1 View  Result Date: 12/18/2019 CLINICAL DATA:  Shortness of breath. EXAM: CHEST  1 VIEW COMPARISON:  Chest CT 01/09/2020.  Chest x-ray 01/10/2020. FINDINGS: Mediastinum and hilar structures stable. Borderline cardiomegaly. Pulmonary venous congestion and mild bilateral interstitial prominence suggesting mild CHF. Density noted over the right lower chest consistent previously identified large hiatal hernia. Associated atelectasis noted. Mild left base atelectasis with elevation left hemidiaphragm. Small bilateral pleural effusions versus pleural scarring. Tiny bilateral pleural effusions  cannot be excluded. No pneumothorax. IMPRESSION: 1. Borderline cardiomegaly. Pulmonary venous congestion and mild bilateral interstitial prominence suggesting mild CHF. 2. Density noted over the right lower chest consistent previously identified large hiatal hernia. Associated atelectasis noted. Mild left base atelectasis with elevation left hemidiaphragm. Tiny bilateral pleural effusions cannot be excluded. Electronically Signed   By: Marcello Moores  Register   On: 12/18/2019 08:46     Scheduled Meds: . apixaban  2.5 mg Oral BID  . arformoterol  15 mcg Nebulization BID  . aspirin EC  81 mg Oral Daily  . budesonide (PULMICORT) nebulizer solution  0.25 mg Nebulization BID  . calcium-vitamin D  1 tablet Oral Daily  . Chlorhexidine Gluconate Cloth  6 each Topical Q0600  . escitalopram  10 mg Oral Daily  . furosemide  40 mg Oral Daily  . guaiFENesin  1,200 mg Oral BID  . ipratropium-albuterol  3 mL Nebulization TID  . ketorolac  15 mg Intravenous Q8H  . lidocaine  2 patch Transdermal Q24H  . methylPREDNISolone (SOLU-MEDROL) injection  60 mg Intravenous Q6H  . montelukast  10 mg Oral QHS  . multivitamin with minerals   Oral Daily  . mupirocin ointment  1 application Nasal BID  . pramipexole  0.25 mg Oral Daily  . simvastatin  20 mg Oral QHS  . spironolactone  25 mg Oral Daily   Continuous Infusions: . sodium chloride Stopped (12/15/19 2126)    Active Problems:   CAP (community acquired pneumonia)  I have seen and examined this patient myself. I have spent 36 minutes in her evaluation and care.  Kazuma Elena, DO [facility]  If 7PM-7AM, please contact night-coverage [email or web address] Password [facility] 12/19/2019, 12:37 PM    LOS: 5 days

## 2019-12-20 ENCOUNTER — Encounter: Payer: Self-pay | Admitting: Internal Medicine

## 2019-12-20 ENCOUNTER — Inpatient Hospital Stay: Payer: Medicare Other

## 2019-12-20 DIAGNOSIS — J449 Chronic obstructive pulmonary disease, unspecified: Secondary | ICD-10-CM

## 2019-12-20 DIAGNOSIS — T17208A Unspecified foreign body in pharynx causing other injury, initial encounter: Secondary | ICD-10-CM

## 2019-12-20 DIAGNOSIS — Z515 Encounter for palliative care: Secondary | ICD-10-CM

## 2019-12-20 DIAGNOSIS — R0600 Dyspnea, unspecified: Secondary | ICD-10-CM

## 2019-12-20 DIAGNOSIS — Z7189 Other specified counseling: Secondary | ICD-10-CM

## 2019-12-20 DIAGNOSIS — R0902 Hypoxemia: Secondary | ICD-10-CM

## 2019-12-20 LAB — CBC WITH DIFFERENTIAL/PLATELET
Abs Immature Granulocytes: 0.15 10*3/uL — ABNORMAL HIGH (ref 0.00–0.07)
Basophils Absolute: 0 10*3/uL (ref 0.0–0.1)
Basophils Relative: 0 %
Eosinophils Absolute: 0 10*3/uL (ref 0.0–0.5)
Eosinophils Relative: 0 %
HCT: 30.2 % — ABNORMAL LOW (ref 36.0–46.0)
Hemoglobin: 9.7 g/dL — ABNORMAL LOW (ref 12.0–15.0)
Immature Granulocytes: 1 %
Lymphocytes Relative: 5 %
Lymphs Abs: 0.8 10*3/uL (ref 0.7–4.0)
MCH: 27.9 pg (ref 26.0–34.0)
MCHC: 32.1 g/dL (ref 30.0–36.0)
MCV: 86.8 fL (ref 80.0–100.0)
Monocytes Absolute: 0.9 10*3/uL (ref 0.1–1.0)
Monocytes Relative: 5 %
Neutro Abs: 15.5 10*3/uL — ABNORMAL HIGH (ref 1.7–7.7)
Neutrophils Relative %: 89 %
Platelets: 310 10*3/uL (ref 150–400)
RBC: 3.48 MIL/uL — ABNORMAL LOW (ref 3.87–5.11)
RDW: 17.8 % — ABNORMAL HIGH (ref 11.5–15.5)
WBC: 17.4 10*3/uL — ABNORMAL HIGH (ref 4.0–10.5)
nRBC: 0 % (ref 0.0–0.2)

## 2019-12-20 LAB — SARS CORONAVIRUS 2 BY RT PCR (HOSPITAL ORDER, PERFORMED IN ~~LOC~~ HOSPITAL LAB): SARS Coronavirus 2: NEGATIVE

## 2019-12-20 MED ORDER — BENZONATATE 200 MG PO CAPS: 200.0000 mg | ORAL_CAPSULE | Freq: Three times a day (TID) | ORAL | 0 refills | Status: AC | PRN

## 2019-12-20 MED ORDER — OXYCODONE HCL 5 MG PO TABS: 2.5000 mg | ORAL_TABLET | Freq: Three times a day (TID) | ORAL | 0 refills | Status: AC | PRN

## 2019-12-20 MED ORDER — SODIUM CHLORIDE 0.9 % IV SOLN
3.0000 g | Freq: Three times a day (TID) | INTRAVENOUS | Status: DC
  Administered 2019-12-20 – 2019-12-22 (×7): 3 g via INTRAVENOUS
  Filled 2019-12-20: qty 8
  Filled 2019-12-20: qty 3
  Filled 2019-12-20 (×4): qty 8
  Filled 2019-12-20: qty 3
  Filled 2019-12-20: qty 8
  Filled 2019-12-20: qty 3
  Filled 2019-12-20 (×2): qty 8

## 2019-12-20 MED ORDER — IPRATROPIUM-ALBUTEROL 0.5-2.5 (3) MG/3ML IN SOLN: 3.0000 mL | Freq: Three times a day (TID) | RESPIRATORY_TRACT | 0 refills | Status: AC

## 2019-12-20 MED ORDER — GUAIFENESIN ER 600 MG PO TB12: 1200.0000 mg | ORAL_TABLET | Freq: Two times a day (BID) | ORAL | 0 refills | Status: AC

## 2019-12-20 MED ORDER — IOHEXOL 300 MG/ML  SOLN
75.0000 mL | Freq: Once | INTRAMUSCULAR | Status: AC | PRN
  Administered 2019-12-20: 15:00:00 75 mL via INTRAVENOUS

## 2019-12-20 MED ORDER — ARFORMOTEROL TARTRATE 15 MCG/2ML IN NEBU: 15.0000 ug | INHALATION_SOLUTION | Freq: Two times a day (BID) | RESPIRATORY_TRACT | 0 refills | Status: AC

## 2019-12-20 MED ORDER — PREDNISONE 20 MG PO TABS: ORAL_TABLET | ORAL | 0 refills | Status: AC

## 2019-12-20 NOTE — Care Management Important Message (Signed)
Important Message  Patient Details  Name: Natasha [name] MRN: [id number] Date of Birth: 11-Jul-1928   Medicare Important Message Given:  Yes     Juliann Pulse A Katrine Radich 12/20/2019, 11:53 AM

## 2019-12-20 NOTE — Consult Note (Signed)
Natasha [name], [place] [id number] 1928-05-07 Karie Kirks, DO  Reason for Consult: retropharyngeal swelling  HPI: 86 y.o. female with history of diastolic heart failure, COPD admitted for shortness of breath and chest pain.  Close to discharge today, the patient reported swelling in her throat and a lateral neck xray was obtained.   This showed possible retropharyngeal swelling.  A CT scan was obtained and urgent ENT consult placed.  Patient is hard of hearing and hoarse, of which her friend reports is new over past several weeks.  Patient has reported chest pain but CT of chest did not show acute issue.  Patient does report some nasal drainage.  No shortness of breath.  History of reflux per chart.  She reports that she feels like something is stuck in her chest that will not come back up.  History of cough with eating recently that is new.  Allergies:  Allergies  Allergen Reactions  . Codeine Diarrhea    Patient can tolerate small amounts of OXYCODONE per patient info sheet  . Hydrocodone Nausea And Vomiting    Patient can tolerate small amounts of OXYCODONE per patient info sheet  . Levofloxacin Other (See Comments)    Muscle cramps  . Tramadol Nausea And Vomiting    ROS: Review of systems normal other than 12 systems except per HPI.  PMH:  Past Medical History:  Diagnosis Date  . Anemia   . Arthritis   . Asthma   . Bilateral pneumonia may 2017  . Bronchitis   . Cardiomegaly   . Chronic respiratory failure with hypoxia ([place])   . Cochlear implant in place    bilateral, [name], 04/26/2007   . Gallstones 2015  . GERD (gastroesophageal reflux disease)   . H/O blood clots 2014  . History of home oxygen therapy    at night  . Hypertension   . Hypokalemia   . Peripheral vascular disease ([place])    with arterial clots- on xarelto  . Polyneuropathy   . Renal disorder    Chronic Kidney Disease, Stage 5  . Restless leg syndrome     FH:  Family History  Problem Relation  Age of Onset  . Heart disease Mother   . Heart disease Father   . Cancer Sister        lung  . Breast cancer Daughter     SH:  Social History   Socioeconomic History  . Marital status: Married    Spouse name: Not on file  . Number of children: Not on file  . Years of education: Not on file  . Highest education level: Not on file  Occupational History  . Occupation: retired  Tobacco Use  . Smoking status: Former Smoker    Packs/day: 1.00    Years: 25.00    Pack years: 25.00    Types: Cigarettes    Quit date: 1982    Years since quitting: 39.9  . Smokeless tobacco: Never Used  Vaping Use  . Vaping Use: Never used  Substance and Sexual Activity  . Alcohol use: No  . Drug use: No  . Sexual activity: Not Currently  Other Topics Concern  . Not on file  Social History Narrative   From [facility] independent living   Social Determinants of Health   Financial Resource Strain: Not on file  Food Insecurity: Not on file  Transportation Needs: Not on file  Physical Activity: Not on file  Stress: Not on file  Social Connections: Not  on file  Intimate Partner Violence: Not on file    PSH:  Past Surgical History:  Procedure Laterality Date  . ABDOMINAL HYSTERECTOMY  1960  . APPENDECTOMY  1946  . BREAST BIOPSY Bilateral    negative  . CHOLECYSTECTOMY  04-24-13  . COCHLEAR IMPLANT  2009  . COLONOSCOPY  2015   Dr. Rayann Heman   . EYE SURGERY  2008,2009   cataract  . KYPHOPLASTY N/A 08/19/2015   Procedure: KYPHOPLASTY;  Surgeon: Hessie Knows, MD;  Location: [facility] ORS;  Service: Orthopedics;  Laterality: N/A;  . PERIPHERAL VASCULAR CATHETERIZATION N/A 02/27/2015   Procedure: Abdominal Aortogram w/Lower Extremity;  Surgeon: Algernon Huxley, MD;  Location: [place] [facility];  Service: Cardiovascular;  Laterality: N/A;  . PERIPHERAL VASCULAR CATHETERIZATION  02/27/2015   Procedure: Lower Extremity Intervention;  Surgeon: Algernon Huxley, MD;  Location: [place] [facility];  Service:  Cardiovascular;;  . PERIPHERAL VASCULAR CATHETERIZATION Left 02/28/2015   Procedure: Lower Extremity Angiography;  Surgeon: Algernon Huxley, MD;  Location: [facility] [facility];  Service: Cardiovascular;  Laterality: Left;  . PERIPHERAL VASCULAR CATHETERIZATION  02/28/2015   Procedure: Lower Extremity Intervention;  Surgeon: Algernon Huxley, MD;  Location: [place] [facility];  Service: Cardiovascular;;  . PERIPHERAL VASCULAR CATHETERIZATION Left 05/22/2015   Procedure: Lower Extremity Angiography;  Surgeon: Algernon Huxley, MD;  Location: [place] [facility];  Service: Cardiovascular;  Laterality: Left;  . PERIPHERAL VASCULAR CATHETERIZATION  05/22/2015   Procedure: Lower Extremity Intervention;  Surgeon: Algernon Huxley, MD;  Location: [place] [facility];  Service: Cardiovascular;;  . PERIPHERAL VASCULAR CATHETERIZATION Left 05/23/2015   Procedure: Lower Extremity Angiography;  Surgeon: Algernon Huxley, MD;  Location: [place] CV LAB;  Service: Cardiovascular;  Laterality: Left;  . PERIPHERAL VASCULAR CATHETERIZATION  05/23/2015   Procedure: Lower Extremity Intervention;  Surgeon: Algernon Huxley, MD;  Location: [place] CV LAB;  Service: Cardiovascular;;  . stent placement  2006-2014   multiple stent placements in legs    Physical  Exam:  GEN-  NAD, sitting supine in best with Ipad NEURO- CN 2-12 grossly intact and symmetric very hard of hearing EARS- clear bilaterally, cochlear implant bilaterally with use of external device on left NOSE- clear anteriorly OC/OP-  No masses or lesions, pulsatile fullness laterally NECK- no LAD, no masses or lesions  CT neck- retropharyngeal course of bilateral carotids, no worrisome retropharyngeal swelling, bilateral maxillary sinus fluid, dilated and fluid filled thoracic esophagus, ? Gastric outlet obstruction  Procedure:  Trans-nasal flexible laryngoscopy:  After verbal consent was obtained, the patient's right nasal cavity was anesthetized with Afrin and  Lidocaine.  A flexible laryngoscope was introduced for visualization of the patient's nasal cavity, nasopharynx, pharynx, and larynx.  This demonstrated some purulent drainage from nasal cavity, visible retropharyngeal carotid arteries, and post-cricoid edema and erythema.  True vocal folds were mobile.  Airway widely patent with no pooling of secretions.  A/P: Dysphagia and chest pain with fluid filled thoracic esophagus, acute maxillary sinusitis  Plan:  No worrisome findings for retropharyngeal swelling.  Recommend GI consultation and possible swallow study for ? Gastric outlet obstruction.  Given dysphagia recommend treatment of sinusitis in short term with IV antibiotics.  Recommend liquid diet and speech therapy evaluation as well until ? Esophageal obstruction solved.   Carloyn Manner 12/20/2019 6:39 PM

## 2019-12-20 NOTE — Progress Notes (Signed)
[facility]) Hospital Liaison RN note:  Received new referral for [facility] out patient palliative program to follow at discharge from Dr. Benny Lennert. Referral has received the patient information. Doran Clay, TOC is aware. Plan is to [facility] to [facility] [address] today.  Thank you for this referral.  Zandra Abts, RN [facility] Liaison [phone]

## 2019-12-20 NOTE — Consult Note (Addendum)
Consultation Note Date: 12/20/2019   Patient Name: Natasha [name]  DOB: 1928-01-21  MRN: [id number]  Age / Sex: 86 y.o., female  PCP: Venia Carbon, MD Referring Physician: Karie Kirks, DO  Reason for Consultation: Establishing goals of care  HPI/Patient Profile:  Natasha [name] is a 86 y.o. female with medical history significant for diastolic heart failure, NYHA class III, anemia, history of cellulitis and abscess of the leg in 2015, chronic obstructive asthma, COPD, CAD of the native coronary artery, diverticulosis, GERD, hypertension, hyperlipidemia, osteoarthritis, osteoporosis, peripheral vascular disease with claudication, restrictive airway, status post above-the-knee amputation of the lower extremity, trigeminal neuralgia, type II non-insulin-dependent diabetes mellitus, former tobacco user, history of anal fissurectomy, rec extraction status post cochlear implant on 04/2007, presented to the emergency department for chief concerns of severe shortness of breath with musculoskeletal chest and back pain.  Clinical Assessment and Goals of Care: Patient is resting in bed. She is very hard of hearing. She has a coclear implant that she states needs to be worked on and she has an appointment 12/21 for this. She states she has 4 children an a husband. She lives at [facility] with her husband.   She states her husband and daughter Nevin Bloodgood are her surrogate decision makers should she need one. Her voice is very high in pitch, and her breathing sounds coarse. She has been eating ice cream, apple sauce, and sucking hard candy. She states her breathing is not typically as it is now. She coughs, and states there is something there that will not come up. She discusses her recent diagnosis of  PNA at her facility. She states she would be amenable to a feeding tube if one is needed.    Broached GOC. She states she  wants to get better, and wants whatever care is needed to get better.    Attending team made aware of concerns for aspiration.    SUMMARY OF RECOMMENDATIONS   Recommend outpatient palliative for full GOC once her coclear implant is working properly.   Recommend SLP eval.   Prognosis:   Unable to determine      Primary Diagnoses: Present on Admission: . CAP (community acquired pneumonia)   I have reviewed the medical record, interviewed the patient and family, and examined the patient. The following aspects are pertinent.  Past Medical History:  Diagnosis Date  . Anemia   . Arthritis   . Asthma   . Bilateral pneumonia may 2017  . Bronchitis   . Cardiomegaly   . Chronic respiratory failure with hypoxia ([place])   . Cochlear implant in place    bilateral, [name], 04/26/2007   . Gallstones 2015  . GERD (gastroesophageal reflux disease)   . H/O blood clots 2014  . History of home oxygen therapy    at night  . Hypertension   . Hypokalemia   . Peripheral vascular disease ([place])    with arterial clots- on xarelto  . Polyneuropathy   . Renal disorder  Chronic Kidney Disease, Stage 5  . Restless leg syndrome    Social History   Socioeconomic History  . Marital status: Married    Spouse name: Not on file  . Number of children: Not on file  . Years of education: Not on file  . Highest education level: Not on file  Occupational History  . Occupation: retired  Tobacco Use  . Smoking status: Former Smoker    Packs/day: 1.00    Years: 25.00    Pack years: 25.00    Types: Cigarettes    Quit date: 1982    Years since quitting: 39.9  . Smokeless tobacco: Never Used  Vaping Use  . Vaping Use: Never used  Substance and Sexual Activity  . Alcohol use: No  . Drug use: No  . Sexual activity: Not Currently  Other Topics Concern  . Not on file  Social History Narrative   From [facility] independent living   Social Determinants of Health   Financial  Resource Strain: Not on file  Food Insecurity: Not on file  Transportation Needs: Not on file  Physical Activity: Not on file  Stress: Not on file  Social Connections: Not on file   Family History  Problem Relation Age of Onset  . Heart disease Mother   . Heart disease Father   . Cancer Sister        lung  . Breast cancer Daughter    Scheduled Meds: . apixaban  2.5 mg Oral BID  . arformoterol  15 mcg Nebulization BID  . aspirin EC  81 mg Oral Daily  . budesonide (PULMICORT) nebulizer solution  0.25 mg Nebulization BID  . calcium-vitamin D  1 tablet Oral Daily  . Chlorhexidine Gluconate Cloth  6 each Topical Q0600  . escitalopram  10 mg Oral Daily  . furosemide  40 mg Oral Daily  . guaiFENesin  1,200 mg Oral BID  . ipratropium-albuterol  3 mL Nebulization TID  . ketorolac  15 mg Intravenous Q8H  . montelukast  10 mg Oral QHS  . multivitamin with minerals   Oral Daily  . mupirocin ointment  1 application Nasal BID  . pramipexole  0.25 mg Oral Daily  . predniSONE  40 mg Oral Q breakfast  . simvastatin  20 mg Oral QHS  . spironolactone  25 mg Oral Daily   Continuous Infusions: . sodium chloride Stopped (12/15/19 2126)   PRN Meds:.sodium chloride, acetaminophen, albuterol, benzonatate, LORazepam, oxyCODONE Medications Prior to Admission:  Prior to Admission medications   Medication Sig Start Date End Date Taking? Authorizing Provider  albuterol (PROVENTIL) (2.5 MG/3ML) 0.083% nebulizer solution Take 2.5 mg by nebulization every 6 (six) hours as needed for wheezing or shortness of breath.   Yes [provider]  albuterol (VENTOLIN HFA) 108 (90 Base) MCG/ACT inhaler Inhale into the lungs every 6 (six) hours as needed for wheezing or shortness of breath.   Yes [provider]  amoxicillin-clavulanate (AUGMENTIN) 875-125 MG tablet Take 1 tablet by mouth 2 (two) times daily.   Yes [provider]  apixaban (ELIQUIS) 2.5 MG TABS tablet Take by mouth 2  (two) times daily.   Yes [provider]  aspirin EC 81 MG tablet Take 81 mg by mouth daily. Swallow whole.   Yes [provider]  escitalopram (LEXAPRO) 10 MG tablet Take 10 mg by mouth daily.   Yes [provider]  furosemide (LASIX) 40 MG tablet Take 40 mg by mouth daily.   Yes  [provider]  LORazepam (ATIVAN) 0.5 MG tablet Take 0.5 mg by mouth every 4 (four) hours as needed for anxiety.   Yes [provider]  magnesium oxide (MAG-OX) 400 MG tablet Take 400 mg by mouth daily.   Yes [provider]  metolazone (ZAROXOLYN) 2.5 MG tablet Take 2.5 mg by mouth. M,W,F   Yes [provider]  montelukast (SINGULAIR) 10 MG tablet Take 10 mg by mouth at bedtime.   Yes [provider]  Multiple Vitamins-Minerals (DECUBI-VITE PO) Take 1 capsule by mouth daily.   Yes [provider]  pramipexole (MIRAPEX) 0.25 MG tablet Take 0.25 mg by mouth daily.   Yes [provider]  PULMICORT FLEXHALER 180 MCG/ACT inhaler Inhale 2 puffs into the lungs 2 (two) times daily. 11/30/19  Yes [provider]  simvastatin (ZOCOR) 20 MG tablet Take 20 mg by mouth daily.   Yes [provider]  sodium chloride (MURO 128) 2 % ophthalmic solution 1 drop.   Yes [provider]  spironolactone (ALDACTONE) 25 MG tablet Take 25 mg by mouth daily.   Yes [provider]  arformoterol (BROVANA) 15 MCG/2ML NEBU Take 2 mLs (15 mcg total) by nebulization 2 (two) times daily. 12/20/19   Swayze, Ava, DO  benzocaine (HURRICAINE) 20 % GEL Use as directed in the mouth or throat.    [provider]  benzonatate (TESSALON) 200 MG capsule Take 1 capsule (200 mg total) by mouth 3 (three) times daily as needed for cough. 12/20/19   Swayze, Ava, DO  Calcium Carbonate-Vit D-Min (CALTRATE 600+D PLUS MINERALS) 600-800 MG-UNIT TABS Take 1 tablet by mouth daily.    [provider]  fluticasone (FLONASE) 50 MCG/ACT  nasal spray Place 1-2 sprays into both nostrils daily. 11/20/19   [provider]  guaiFENesin (MUCINEX) 600 MG 12 hr tablet Take 2 tablets (1,200 mg total) by mouth 2 (two) times daily. 12/20/19   Swayze, Ava, DO  ipratropium-albuterol (DUONEB) 0.5-2.5 (3) MG/3ML SOLN Take 3 mLs by nebulization 3 (three) times daily. 12/20/19   Swayze, Ava, DO  loratadine (CLARITIN) 10 MG tablet Take 10 mg by mouth daily.    [provider]  oxyCODONE (OXY IR/ROXICODONE) 5 MG immediate release tablet Take 0.5 tablets (2.5 mg total) by mouth every 8 (eight) hours as needed for severe pain. 12/20/19   Swayze, Ava, DO  predniSONE (DELTASONE) 20 MG tablet Take 2 tablets (40 mg total) by mouth daily with breakfast for 3 days, THEN 1.5 tablets (30 mg total) daily with breakfast for 3 days, THEN 1 tablet (20 mg total) daily with breakfast for 3 days, THEN 0.5 tablets (10 mg total) daily with breakfast for 3 days. 12/21/19 01/02/20  Swayze, Ava, DO  sennosides-docusate sodium (SENOKOT-S) 8.6-50 MG tablet Take 1 tablet by mouth in the morning and at bedtime.    [provider]  silver sulfADIAZINE (SILVADENE) 1 % cream Apply 1 application topically daily.    [provider]   Allergies  Allergen Reactions  . Codeine Diarrhea    Patient can tolerate small amounts of OXYCODONE per patient info sheet  . Hydrocodone Nausea And Vomiting    Patient can tolerate small amounts of OXYCODONE per patient info sheet  . Levofloxacin Other (See Comments)    Muscle cramps  . Tramadol Nausea And Vomiting   Review of Systems  Respiratory: Positive for cough and shortness of breath.     Physical Exam Pulmonary:     Comments: Coarse  breathing noted.  Neurological:     Mental Status: She is alert.     Vital Signs: BP (!) 147/60 (BP Location: Left Arm)   Pulse 84   Temp 97.8 F (36.6 C)   Resp 18   Ht 4\' 11"  (1.499 m)   SpO2 (!) 88%   BMI 26.66 kg/m  Pain Scale: 0-10   Pain Score: 2     SpO2: SpO2: (!) 88 % O2 Device:SpO2: (!) 88 % O2 Flow Rate: .O2 Flow Rate (L/min): 4 L/min  IO: Intake/output summary:   Intake/Output Summary (Last 24 hours) at 12/20/2019 1551 Last data filed at 12/20/2019 1408 Gross per 24 hour  Intake 0 ml  Output 800 ml  Net -800 ml    LBM: Last BM Date: 12/16/19 Baseline Weight:   Most recent weight:       Palliative Assessment/Data:     Time In: 3:40 Time Out: 4:10 Time Total: 30 min Greater than 50%  of this time was spent counseling and coordinating care related to the above assessment and plan.  Signed by: Asencion Gowda, NP   Please contact Palliative Medicine Team phone at [phone] for questions and concerns.  For individual provider: See Shea Evans

## 2019-12-20 NOTE — NC FL2 (Signed)
[place] LEVEL OF CARE SCREENING TOOL     IDENTIFICATION  Patient Name: Natasha [name] Birthdate: 1928/06/28 Sex: female Admission Date (Current Location): 12/24/2019  [place] and [place] Number:  Engineering geologist and Address:  [facility], [address] [place], [place] [address]      Provider Number: [id number]  Attending Physician Name and Address:  Karie Kirks, DO  Relative Name and Phone Number:  Nevin Bloodgood (daughter) [phone]    Current Level of Care: Hospital Recommended Level of Care: [place] Prior Approval Number:    Date Approved/Denied:   PASRR Number:    Discharge Plan: SNF    Current Diagnoses: Patient Active Problem List   Diagnosis Date Noted  . Arterial occlusion 01/14/2018  . Sacral fracture ([place]) 11/15/2016  . HTN (hypertension) 11/15/2016  . GERD (gastroesophageal reflux disease) 11/15/2016  . RLS (restless legs syndrome) 11/15/2016  . Asthma 11/15/2016  . Back pain 10/29/2016  . CAP (community acquired pneumonia) 03/27/2016  . Cellulitis 03/27/2016  . Hypokalemia 03/27/2016  . Acute respiratory failure ([place]) 03/27/2016  . Sepsis ([place]) 08/24/2015  . HCAP (healthcare-associated pneumonia) 06/27/2015  . Pneumonia 06/07/2015  . Hypotension 03/11/2015  . Lower extremity atheroembolism ([place]) 02/27/2015  . Ischemic leg 02/27/2015  . Calculus of gallbladder with other cholecystitis, without mention of obstruction 04/20/2013    Orientation RESPIRATION BLADDER Height & Weight     Self,Time,Situation,Place  O2 Incontinent Weight:   Height:  4\' 11"  (149.9 cm)  BEHAVIORAL SYMPTOMS/MOOD NEUROLOGICAL BOWEL NUTRITION STATUS      Incontinent Diet (heart healthy)  AMBULATORY STATUS COMMUNICATION OF NEEDS Skin   Extensive Assist Verbally Bruising                       Personal Care Assistance Level of Assistance  Bathing,Feeding,Dressing Bathing Assistance: Limited  assistance Feeding assistance: Limited assistance Dressing Assistance: Limited assistance     Functional Limitations Info  Sight,Hearing Sight Info: Impaired Hearing Info: Impaired      SPECIAL CARE FACTORS FREQUENCY  PT (By licensed PT),OT (By licensed OT)     PT Frequency: 5 times per week OT Frequency: 5 times per week            Contractures      Additional Factors Info  Code Status,Allergies Code Status Info: Full Allergies Info: Codeine, hydrocodone, levofloxacin, tramadol           Current Medications (12/20/2019):  This is the current hospital active medication list Current Facility-Administered Medications  Medication Dose Route Frequency Provider Last Rate Last Admin  . 0.9 %  sodium chloride infusion   Intravenous PRN Vashti Hey, MD   Stopped at 12/15/19 2126  . acetaminophen (TYLENOL) tablet 650 mg  650 mg Oral Q6H PRN Vashti Hey, MD   650 mg at 12/18/19 2230  . albuterol (VENTOLIN HFA) 108 (90 Base) MCG/ACT inhaler 2 puff  2 puff Inhalation Q6H PRN Cox, Amy N, DO      . apixaban (ELIQUIS) tablet 2.5 mg  2.5 mg Oral BID Cox, Amy N, DO   2.5 mg at 12/20/19 0856  . arformoterol (BROVANA) nebulizer solution 15 mcg  15 mcg Nebulization BID Raiford Noble [place], DO   15 mcg at 12/20/19 0805  . aspirin EC tablet 81 mg  81 mg Oral Daily Cox, Amy N, DO   81 mg at 12/20/19 0855  . benzonatate (TESSALON) capsule 200 mg  200 mg Oral  TID PRN Bonnell Public Tublu, MD   200 mg at 12/15/19 1433  . budesonide (PULMICORT) nebulizer solution 0.25 mg  0.25 mg Nebulization BID Raiford Noble [place], DO   0.25 mg at 12/20/19 0805  . calcium-vitamin D (OSCAL WITH D) 500-200 MG-UNIT per tablet 1 tablet  1 tablet Oral Daily Cox, Amy N, DO   1 tablet at 12/20/19 0854  . Chlorhexidine Gluconate Cloth 2 % PADS 6 each  6 each Topical Q0600 Raiford Noble [place], [place]   6 each at 12/20/19 [phone]  . escitalopram (LEXAPRO) tablet 10 mg  10 mg Oral Daily Cox, Amy N,  DO   10 mg at 12/20/19 0855  . furosemide (LASIX) tablet 40 mg  40 mg Oral Daily Cox, Amy N, DO   40 mg at 12/20/19 0855  . guaiFENesin (MUCINEX) 12 hr tablet 1,200 mg  1,200 mg Oral BID Raiford Noble [place], DO   1,200 mg at 12/20/19 0854  . ipratropium-albuterol (DUONEB) 0.5-2.5 (3) MG/3ML nebulizer solution 3 mL  3 mL Nebulization TID Cox, Amy N, DO   3 mL at 12/20/19 0805  . ketorolac (TORADOL) 30 MG/ML injection 15 mg  15 mg Intravenous Q8H Swayze, Ava, DO   15 mg at 12/20/19 0643  . LORazepam (ATIVAN) tablet 0.5 mg  0.5 mg Oral Q4H PRN Cox, Amy N, DO   0.5 mg at 12/19/19 2323  . montelukast (SINGULAIR) tablet 10 mg  10 mg Oral QHS Cox, Amy N, DO   10 mg at 12/19/19 2141  . multivitamin with minerals tablet   Oral Daily Cox, Amy N, DO   1 tablet at 12/20/19 0855  . mupirocin ointment (BACTROBAN) 2 % 1 application  1 application Nasal BID Raiford Noble [place], [place]   1 application at 62/94/76 [phone]  . oxyCODONE (Oxy IR/ROXICODONE) immediate release tablet 2.5 mg  2.5 mg Oral Q8H PRN Bonnell Public Tublu, MD   2.5 mg at 12/20/19 0522  . pramipexole (MIRAPEX) tablet 0.25 mg  0.25 mg Oral Daily Cox, Amy N, DO   0.25 mg at 12/19/19 2142  . predniSONE (DELTASONE) tablet 40 mg  40 mg Oral Q breakfast Swayze, Ava, DO   40 mg at 12/20/19 0856  . simvastatin (ZOCOR) tablet 20 mg  20 mg Oral QHS Cox, Amy N, DO   20 mg at 12/19/19 2141  . spironolactone (ALDACTONE) tablet 25 mg  25 mg Oral Daily Cox, Amy N, DO   25 mg at 12/20/19 0354     Discharge Medications: Please see discharge summary for a list of discharge medications.  Relevant Imaging Results:  Relevant Lab Results:   Additional Information SS#[id number]  Shelbie Hutching, RN

## 2019-12-20 NOTE — Discharge Summary (Signed)
Physician Discharge Summary  Natasha [name] GNF:[id number] DOB: Mar 28, 1928 DOA: 12/22/2019  PCP: Venia Carbon, MD  Admit date: 01/03/2020 Discharge date: 12/20/2019  Recommendations for Outpatient Follow-up:  1. Discharge to SNF on 3L O2 2. Follow up with PCP in 7-10 days. Have chemistry checked at that visit. 3. Follow up with Dr. Mortimer Fries in 2-4 weeks. 4. Ambulatory referral to palliative care.  Discharge Diagnoses: Principal diagnosis is #1 1. Acute on chronic respiratory failure secondary to CAP, Pulmonary vascular congestion, and COPD exacerbation 2. Pleuritic chest pain 3. Hyponatremia 4. Chronic pain 5. HFpEF 6. Peripheral vascular disease 7. Anxiety and depression 8. Hyperlipidemia  Discharge Condition: Fair  Disposition: SNF  Diet recommendation: Heart healthy  There were no vitals filed for this visit.  History of present illness: Natasha [name] is a 86 y.o. female with medical history significant for diastolic heart failure, NYHA class III, anemia, history of cellulitis and abscess of the leg in 2015, chronic obstructive asthma, COPD, CAD of the native coronary artery, diverticulosis, GERD, hypertension, hyperlipidemia, osteoarthritis, osteoporosis, peripheral vascular disease with claudication, restrictive airway, status post above-the-knee amputation of the lower extremity, trigeminal neuralgia, type II non-insulin-dependent diabetes mellitus, former tobacco user, history of anal fissurectomy, rec extraction status post cochlear implant on 04/2007, presented to the emergency department for chief concerns of severe shortness of breath with musculoskeletal chest and back pain.  She was evaluated by her pulmonologist, Dr. Mortimer Fries and at his urging presented to the emergency department for concerns of COPD exacerbation.  She reports congestion, cough that is productive, shortness of breath for two weeks. She reports it worsened in the last 2-3 days. She reports she  has difficulty clearing her mucus.  She reports that she has been compliant with her inhaler, however the use of her inhaler did not help improved her symptoms thus prompting her to present to her pulmonologist office.  She endorses chest pain with cough and deep inhalation. She states the chest pain is sharp and lasts for seconds. She endorses dysuria and denies hematuria. She endorses back pain that started this morning and is sharp and persistent. It is worse with coughing. She denies lost of appetite.   Social history: Lives with her husband.  Former tobacco user with 7.75-pack-year history.  Denies EtOH and recreational drug use.  ED Course: Discussed with ED provider, requesting admission for COPD exacerbation versus CAP Pneumonia. Vital signs were afebrile stable satting 100% on 6 L nasal cannula.  Reassuring.  Status post duo nebs x2, orders for azithromycin and ceftriaxone per ED provider.  CTA of the chest for PE was ordered and read as no pulmonary emboli.  Opacity dependently in the left base possible representation of atelectasis versus pneumonia.  Loss of height at T7, approximately 35% new since 2020, age indeterminate.  Large hiatal hernia with atelectasis that adjacent to the hernia in the right base.  Atherosclerotic changes in the thoracic aorta.  CAD.  Emphysema changes in the lungs.  Hospital Course:  86 year old female with PMH significant for HFpEF class III, COPD, DM 2, s/p AKA, HTN, CAD, diverticulosis and GERD was sent over from her pulmonologist Dr. Zoila Shutter office due to worsening respiratory status.  Patient has been coughing for the past 2 to 3 days with difficulty clearing her mucus and shortness of breath not improved with inhaler.  In the ED patient was noted to have increased work of breathing but able to maintain O2 saturations on 6 L nasal cannula and abnormal CT.  CTA was negative for PE but did show possible left base opacity suggestive of pneumonia.  Patient was  noted be afebrile but did have leukocytosis at 16.  She was admitted for COPD exacerbation with community-acquired pneumonia.  She was started on ceftriaxone azithromycin.  CXR on 12/18/2019 and 12/19/2019 continue to demonstrate pulmonary vascular congestion, atelectasis and large hiatal hernia. 12/20/2019 The patient is on her usual oxygen requirements. Echocardiogram demonstrates EF of 60-65% with normal LV function and no regional wall motion abnormalities. There is moderate left ventricular hypertrophy. Left ventricular diastolic parameters were normal. RV systolic function and size is normal.  The patient will be discharged back to her facility on her baseline O2 requirements.  Today's assessment: S: The patient is resting comfortably. No new complaints. O: Vitals:  Vitals:   12/20/19 1001 12/20/19 1002  BP:    Pulse:    Resp:    Temp:    SpO2: (!) 88% 93%    Exam:  Constitutional:  . The patient is awake, alert, and oriented x 3. No acute distress. Respiratory:  . No increased work of breathing. . No wheezes, rales, or rhonchi . Diminished breath sounds bilaterally . No tactile fremitus Cardiovascular:  . Regular rate and rhythm . No murmurs, ectopy, or gallups. . No lateral PMI. No thrills. Abdomen:  . Abdomen is soft, non-tender, non-distended . No hernias, masses, or organomegaly . Normoactive bowel sounds.  Musculoskeletal:  . No cyanosis, clubbing, or edema Skin:  . No rashes, lesions, ulcers . palpation of skin: no induration or nodules Neurologic:  . CN 2-12 intact . Sensation all 4 extremities intact Psychiatric:  . Mental status o Mood, affect appropriate o Orientation to person, place, time  . judgment and insight appear intact   Discharge Instructions  Discharge Instructions    Activity as tolerated - No restrictions   Complete by: As directed    Amb Referral to Palliative Care   Complete by: As directed    Call MD for:  difficulty breathing,  headache or visual disturbances   Complete by: As directed    Call MD for:  temperature >100.4   Complete by: As directed    Diet - low sodium heart healthy   Complete by: As directed    Discharge instructions   Complete by: As directed    Discharge to SNF on 3L O2 Follow up with PCP in 7-10 days. Have chemistry checked at that visit. Follow up with Dr. Mortimer Fries in 2-4 weeks. Ambulatory referral to palliative care.   For home use only DME Nebulizer machine   Complete by: As directed    Patient needs a nebulizer to treat with the following condition: COPD (chronic obstructive pulmonary disease) ([place])   Length of Need: Lifetime   Increase activity slowly   Complete by: As directed      Allergies as of 12/20/2019      Reactions   Codeine Diarrhea   Patient can tolerate small amounts of OXYCODONE per patient info sheet   Hydrocodone Nausea And Vomiting   Patient can tolerate small amounts of OXYCODONE per patient info sheet   Levofloxacin Other (See Comments)   Muscle cramps   Tramadol Nausea And Vomiting      Medication List    STOP taking these medications   amoxicillin-clavulanate 875-125 MG tablet Commonly known as: AUGMENTIN   metolazone 2.5 MG tablet Commonly known as: ZAROXOLYN   silver sulfADIAZINE 1 % cream Commonly known as: SILVADENE  TAKE these medications   albuterol (2.5 MG/3ML) 0.083% nebulizer solution Commonly known as: PROVENTIL Take 2.5 mg by nebulization every 6 (six) hours as needed for wheezing or shortness of breath.   albuterol 108 (90 Base) MCG/ACT inhaler Commonly known as: VENTOLIN HFA Inhale into the lungs every 6 (six) hours as needed for wheezing or shortness of breath.   arformoterol 15 MCG/2ML Nebu Commonly known as: BROVANA Take 2 mLs (15 mcg total) by nebulization 2 (two) times daily.   aspirin EC 81 MG tablet Take 81 mg by mouth daily. Swallow whole.   benzocaine 20 % Gel Commonly known as: HURRICAINE Use as directed in the  mouth or throat.   benzonatate 200 MG capsule Commonly known as: TESSALON Take 1 capsule (200 mg total) by mouth 3 (three) times daily as needed for cough.   Caltrate 600+D Plus Minerals 600-800 MG-UNIT Tabs Take 1 tablet by mouth daily.   DECUBI-VITE PO Take 1 capsule by mouth daily.   Eliquis 2.5 MG Tabs tablet Generic drug: apixaban Take by mouth 2 (two) times daily.   escitalopram 10 MG tablet Commonly known as: LEXAPRO Take 10 mg by mouth daily.   fluticasone 50 MCG/ACT nasal spray Commonly known as: FLONASE Place 1-2 sprays into both nostrils daily.   furosemide 40 MG tablet Commonly known as: LASIX Take 40 mg by mouth daily.   guaiFENesin 600 MG 12 hr tablet Commonly known as: MUCINEX Take 2 tablets (1,200 mg total) by mouth 2 (two) times daily.   ipratropium-albuterol 0.5-2.5 (3) MG/3ML Soln Commonly known as: DUONEB Take 3 mLs by nebulization 3 (three) times daily.   loratadine 10 MG tablet Commonly known as: CLARITIN Take 10 mg by mouth daily.   LORazepam 0.5 MG tablet Commonly known as: ATIVAN Take 0.5 mg by mouth every 4 (four) hours as needed for anxiety.   magnesium oxide 400 MG tablet Commonly known as: MAG-OX Take 400 mg by mouth daily.   montelukast 10 MG tablet Commonly known as: SINGULAIR Take 10 mg by mouth at bedtime.   oxyCODONE 5 MG immediate release tablet Commonly known as: Oxy IR/ROXICODONE Take 0.5 tablets (2.5 mg total) by mouth every 8 (eight) hours as needed for severe pain.   pramipexole 0.25 MG tablet Commonly known as: MIRAPEX Take 0.25 mg by mouth daily.   predniSONE 20 MG tablet Commonly known as: DELTASONE Take 2 tablets (40 mg total) by mouth daily with breakfast for 3 days, THEN 1.5 tablets (30 mg total) daily with breakfast for 3 days, THEN 1 tablet (20 mg total) daily with breakfast for 3 days, THEN 0.5 tablets (10 mg total) daily with breakfast for 3 days. Start taking on: December 21, 2019   Pulmicort Flexhaler  180 MCG/ACT inhaler Generic drug: budesonide Inhale 2 puffs into the lungs 2 (two) times daily.   sennosides-docusate sodium 8.6-50 MG tablet Commonly known as: SENOKOT-S Take 1 tablet by mouth in the morning and at bedtime.   simvastatin 20 MG tablet Commonly known as: ZOCOR Take 20 mg by mouth daily.   sodium chloride 2 % ophthalmic solution Commonly known as: MURO 128 1 drop.   spironolactone 25 MG tablet Commonly known as: ALDACTONE Take 25 mg by mouth daily.            Durable Medical Equipment  (From admission, onward)         Start     Ordered   12/20/19 0000  For home use only DME Nebulizer machine  Question Answer Comment  Patient needs a nebulizer to treat with the following condition COPD (chronic obstructive pulmonary disease) ([place])   Length of Need Lifetime      12/20/19 0959         Allergies  Allergen Reactions  . Codeine Diarrhea    Patient can tolerate small amounts of OXYCODONE per patient info sheet  . Hydrocodone Nausea And Vomiting    Patient can tolerate small amounts of OXYCODONE per patient info sheet  . Levofloxacin Other (See Comments)    Muscle cramps  . Tramadol Nausea And Vomiting    The results of significant diagnostics from this hospitalization (including imaging, microbiology, ancillary and laboratory) are listed below for reference.    Significant Diagnostic Studies: DG Chest 1 View  Result Date: 12/19/2019 CLINICAL DATA:  Shortness of breath EXAM: CHEST  1 VIEW COMPARISON:  Yesterday FINDINGS: Intrathoracic stomach at the right base with overlying pulmonary opacity by CT. Volume loss and streaky density at the left base which is non progressed. No edema, effusion, or pneumothorax. Normal heart size. IMPRESSION: 1. Stable from yesterday. 2. Airway opacity and atelectasis at the lower lobes by recent chest CT. Electronically Signed   By: Monte Fantasia M.D.   On: 12/19/2019 07:33   DG Chest 1 View  Result Date:  12/18/2019 CLINICAL DATA:  Shortness of breath. EXAM: CHEST  1 VIEW COMPARISON:  Chest CT 12/20/2019.  Chest x-ray 12/24/2019. FINDINGS: Mediastinum and hilar structures stable. Borderline cardiomegaly. Pulmonary venous congestion and mild bilateral interstitial prominence suggesting mild CHF. Density noted over the right lower chest consistent previously identified large hiatal hernia. Associated atelectasis noted. Mild left base atelectasis with elevation left hemidiaphragm. Small bilateral pleural effusions versus pleural scarring. Tiny bilateral pleural effusions cannot be excluded. No pneumothorax. IMPRESSION: 1. Borderline cardiomegaly. Pulmonary venous congestion and mild bilateral interstitial prominence suggesting mild CHF. 2. Density noted over the right lower chest consistent previously identified large hiatal hernia. Associated atelectasis noted. Mild left base atelectasis with elevation left hemidiaphragm. Tiny bilateral pleural effusions cannot be excluded. Electronically Signed   By: Marcello Moores  Register   On: 12/18/2019 08:46   DG Chest 2 View  Result Date: 12/20/2019 CLINICAL DATA:  Short of breath EXAM: CHEST - 2 VIEW COMPARISON:  Chest CT 01/24/2018.  Chest x-ray 01/12/2018 FINDINGS: Heart size and vascularity normal. Large hiatal hernia on the right is unchanged with associated mild right lower lobe atelectasis. Negative for pneumonia or effusion. Left lung is clear. Atherosclerotic calcification aortic arch. IMPRESSION: Large hiatal hernia on the right. No acute abnormality and no change from prior studies. Electronically Signed   By: Franchot Gallo M.D.   On: 01/08/2020 11:07   CT Angio Chest PE W and/or Wo Contrast  Result Date: 12/12/2019 CLINICAL DATA:  Pulmonary embolus suspected. Shortness of breath, wheezing, and back pain. Patient has been on antibiotics without improvement. EXAM: CT ANGIOGRAPHY CHEST WITH CONTRAST TECHNIQUE: Multidetector CT imaging of the chest was performed using  the standard protocol during bolus administration of intravenous contrast. Multiplanar CT image reconstructions and MIPs were obtained to evaluate the vascular anatomy. CONTRAST:  43mL OMNIPAQUE IOHEXOL 350 MG/ML SOLN COMPARISON:  Chest x-ray December 14, 2019. CT pulmonary angiogram January 24, 2018. FINDINGS: Cardiovascular: Atherosclerotic changes seen in the nonaneurysmal aorta. No aortic dissection. The heart is unchanged. Calcified coronary artery disease is seen in the right coronary artery in the LAD. No pulmonary emboli. Mediastinum/Nodes: No effusions. The thyroid is unremarkable. There is a large hiatal hernia  with the majority of the stomach above the diaphragm, to the right. This finding is unchanged since January of 2020. A few prominent lymph nodes are stable and of no concern. No suspicious adenopathy. Lungs/Pleura: Central airways are unchanged. No pneumothorax. Emphysematous changes are seen, particularly in the upper lobes. Atelectasis is seen adjacent to the right hiatal hernia. Platelike opacity in the right base on series 6, image 44 and coronal image 59 is consistent with atelectasis. There is opacity dependently in the left base is well. No suspicious nodules or masses. Upper Abdomen: No acute abnormality. Musculoskeletal: There is loss of height of T7 is new since 2020 but otherwise age indeterminate. There is approximately for 35% loss of height. Review of the MIP images confirms the above findings. IMPRESSION: 1. No pulmonary emboli. 2. Opacity dependently in the left base could represent atelectasis or infiltrate/pneumonia. Recommend clinical correlation. 3. Loss of height of T7, approximately 35%, new since 2020 but otherwise age indeterminate. 4. Large hiatal hernia with atelectasis that adjacent to the hernia in the right base. 5. Atherosclerotic changes in the thoracic aorta. 6. Coronary artery disease as above. 7. Emphysematous changes in the lungs. Aortic Atherosclerosis  (ICD10-I70.0) and Emphysema (ICD10-J43.9). Electronically Signed   By: Dorise Bullion III M.D   On: 01/04/2020 15:27   ECHOCARDIOGRAM COMPLETE  Result Date: 12/19/2019    ECHOCARDIOGRAM REPORT   Patient Name:   BONNIE OVERDORF Date of Exam: 12/19/2019 Medical Rec #:  [id number]           Height:       59.0 in Accession #:    [id number]          Weight:       132.0 lb Date of Birth:  [birth year]           BSA:          1.546 m Patient Age:    86 years            BP:           127/69 mmHg Patient Gender: F                   HR:           82 bpm. Exam Location:  [facility] Procedure: 2D Echo, Color Doppler and Cardiac Doppler Indications:     CHF- acute systolic 563.14  History:         Patient has no prior history of Echocardiogram examinations.                  COPD; Risk Factors:Hypertension. History of blood clots, PVD.  Sonographer:     Sherrie Sport RDCS (AE) Referring Phys:  [id number] Donell Tomkins Benny Lennert Diagnosing Phys: Bartholome Bill MD IMPRESSIONS  1. Left ventricular ejection fraction, by estimation, is 60 to 65%. The left ventricle has normal function. The left ventricle has no regional wall motion abnormalities. There is moderate left ventricular hypertrophy. Left ventricular diastolic parameters were normal.  2. Right ventricular systolic function is normal. The right ventricular size is normal.  3. The mitral valve was not well visualized. No evidence of mitral valve regurgitation.  4. The aortic valve was not well visualized. Aortic valve regurgitation is not visualized. FINDINGS  Left Ventricle: Left ventricular ejection fraction, by estimation, is 60 to 65%. The left ventricle has normal function. The left ventricle has no regional wall motion abnormalities. The left ventricular internal cavity size was normal in size. There is  moderate  left ventricular hypertrophy. Left ventricular diastolic parameters were normal. Right Ventricle: The right ventricular size is normal. No increase in right ventricular wall thickness.  Right ventricular systolic function is normal. Left Atrium: Left atrial size was normal in size. Right Atrium: Right atrial size was normal in size. Pericardium: There is no evidence of pericardial effusion. Mitral Valve: The mitral valve was not well visualized. No evidence of mitral valve regurgitation. Tricuspid Valve: The tricuspid valve is not well visualized. Tricuspid valve regurgitation is not demonstrated. Aortic Valve: The aortic valve was not well visualized. Aortic valve regurgitation is not visualized. Pulmonic Valve: The pulmonic valve was not well visualized. Pulmonic valve regurgitation is not visualized. Aorta: The aortic root is normal in size and structure and the aortic root was not well visualized. IAS/Shunts: The interatrial septum was not assessed.  LEFT VENTRICLE PLAX 2D LVIDd:         2.99 cm LVIDs:         2.04 cm LV PW:         1.00 cm LV IVS:        1.02 cm LVOT diam:     2.00 cm LVOT Area:     3.14 cm  LEFT ATRIUM         Index LA diam:    2.30 cm 1.49 cm/m                        PULMONIC VALVE AORTA                 PV Vmax:        0.80 m/s Ao Root diam: 2.40 cm PV Peak grad:   2.6 mmHg                       RVOT Peak grad: 4 mmHg   SHUNTS Systemic Diam: 2.00 cm Bartholome Bill MD Electronically signed by Bartholome Bill MD Signature Date/Time: 12/19/2019/4:40:55 PM    Final     Microbiology: Recent Results (from the past 240 hour(s))  Resp Panel by RT-PCR (Flu A&B, Covid) Nasopharyngeal Swab     Status: None   Collection Time: 01/08/2020  3:35 PM   Specimen: Nasopharyngeal Swab; Nasopharyngeal(NP) swabs in vial transport medium  Result Value Ref Range Status   SARS Coronavirus 2 by RT PCR NEGATIVE NEGATIVE Final    Comment: (NOTE) SARS-CoV-2 target nucleic acids are NOT DETECTED.  The SARS-CoV-2 RNA is generally detectable in upper respiratory specimens during the acute phase of infection. The lowest concentration of SARS-CoV-2 viral copies this assay can detect is 138  copies/mL. A negative result does not preclude SARS-Cov-2 infection and should not be used as the sole basis for treatment or other patient management decisions. A negative result may occur with  improper specimen collection/handling, submission of specimen other than nasopharyngeal swab, presence of viral mutation(s) within the areas targeted by this assay, and inadequate number of viral copies(<138 copies/mL). A negative result must be combined with clinical observations, patient history, and epidemiological information. The expected result is Negative.  Fact Sheet for Patients:  EntrepreneurPulse.com.au  Fact Sheet for Healthcare Providers:  IncredibleEmployment.be  This test is no t yet approved or cleared by the [place] FDA and  has been authorized for detection and/or diagnosis of SARS-CoV-2 by FDA under an Emergency Use Authorization (EUA). This EUA will remain  in effect (meaning this test can be used) for the duration of the COVID-19 declaration  under Section 564(b)(1) of the Act, 21 U.S.C.section 360bbb-3(b)(1), unless the authorization is terminated  or revoked sooner.       Influenza A by PCR NEGATIVE NEGATIVE Final   Influenza B by PCR NEGATIVE NEGATIVE Final    Comment: (NOTE) The Xpert Xpress SARS-CoV-2/FLU/RSV plus assay is intended as an aid in the diagnosis of influenza from Nasopharyngeal swab specimens and should not be used as a sole basis for treatment. Nasal washings and aspirates are unacceptable for Xpert Xpress SARS-CoV-2/FLU/RSV testing.  Fact Sheet for Patients: EntrepreneurPulse.com.au  Fact Sheet for Healthcare Providers: IncredibleEmployment.be  This test is not yet approved or cleared by the [place] FDA and has been authorized for detection and/or diagnosis of SARS-CoV-2 by FDA under an Emergency Use Authorization (EUA). This EUA will remain in effect (meaning  this test can be used) for the duration of the COVID-19 declaration under Section 564(b)(1) of the Act, 21 U.S.C. section 360bbb-3(b)(1), unless the authorization is terminated or revoked.  Performed at [facility], [address] [place], [place] [address]   MRSA PCR Screening     Status: Abnormal   Collection Time: 01/02/2020  5:02 PM   Specimen: Nasal Mucosa; Nasopharyngeal  Result Value Ref Range Status   MRSA by PCR POSITIVE (A) NEGATIVE Final    Comment:        The GeneXpert MRSA Assay (FDA approved for NASAL specimens only), is one component of a comprehensive MRSA colonization surveillance program. It is not intended to diagnose MRSA infection nor to guide or monitor treatment for MRSA infections. RESULT CALLED TO, READ BACK BY AND VERIFIED WITH: WILACYNC STOVER 12/15/19 AT 2000 BY ACR Performed at [facility], [address] [place], [place] [address]   Blood culture (routine x 2)     Status: None   Collection Time: 01/04/2020  5:28 PM   Specimen: BLOOD  Result Value Ref Range Status   Specimen Description BLOOD LEFT FOREARM  Final   Special Requests   Final    BOTTLES DRAWN AEROBIC AND ANAEROBIC Blood Culture adequate volume   Culture   Final    NO GROWTH 5 DAYS Performed at [facility], [address] [place], [place] [address]    Report Status 12/19/2019 FINAL  Final  Blood culture (routine x 2)     Status: None   Collection Time: 01/02/2020  5:28 PM   Specimen: BLOOD  Result Value Ref Range Status   Specimen Description BLOOD RIGHT ASSIST CONTROL  Final   Special Requests   Final    BOTTLES DRAWN AEROBIC AND ANAEROBIC Blood Culture adequate volume   Culture   Final    NO GROWTH 5 DAYS Performed at [facility], [address] [place], [place] [address]    Report Status 12/19/2019 FINAL  Final     Labs: Basic Metabolic Panel: Recent Labs  Lab 12/15/19 0515 12/16/19 0501 12/17/19 0429 12/18/19 0832  12/19/19 0525  NA 130* 131* 136 133* 137  K 3.4* 4.3 4.7 4.4 4.2  CL 88* 91* 94* 91* 95*  CO2 30 30 34* 31 33*  GLUCOSE 162* 145* 114* 183* 156*  BUN 23 24* 21 28* 38*  CREATININE 0.92 0.82 0.74 0.76 0.83  CALCIUM 9.2 9.3 9.6 9.3 9.6  MG  --   --   --  2.4 2.3  PHOS  --   --   --  2.6 2.6   Liver Function Tests: Recent Labs  Lab 12/13/2019 1020  12/15/19 0515 12/18/19 0832 12/19/19 0525  AST 24 25 19 18   ALT 18 18 18 17   ALKPHOS 86 75 72 56  BILITOT 0.7 0.6 0.3 0.5  PROT 7.3 6.5 6.3* 5.9*  ALBUMIN 3.4* 2.9* 3.1* 2.9*   No results for input(s): LIPASE, AMYLASE in the last 168 hours. No results for input(s): AMMONIA in the last 168 hours. CBC: Recent Labs  Lab 12/16/19 0501 12/17/19 0429 12/18/19 0832 12/19/19 0525 12/20/19 0420  WBC 10.9* 12.3* 11.5* 9.9 17.4*  NEUTROABS 9.6* 9.4* 10.3* 9.2* 15.5*  HGB 10.3* 10.7* 11.1* 9.4* 9.7*  HCT 32.7* 34.0* 35.0* 30.3* 30.2*  MCV 86.3 86.3 86.6 88.1 86.8  PLT 301 300 320 290 310   Cardiac Enzymes: No results for input(s): CKTOTAL, CKMB, CKMBINDEX, TROPONINI in the last 168 hours. BNP: BNP (last 3 results) Recent Labs    01/05/2020 1020  BNP 97.5    ProBNP (last 3 results) No results for input(s): PROBNP in the last 8760 hours.  CBG: No results for input(s): GLUCAP in the last 168 hours.  Active Problems:   CAP (community acquired pneumonia)   Time coordinating discharge: 38 minutes.  Signed:        Nelle Sayed, DO [facility]  12/20/2019, 10:04 AM

## 2019-12-21 ENCOUNTER — Inpatient Hospital Stay: Payer: Medicare Other

## 2019-12-21 DIAGNOSIS — K220 Achalasia of cardia: Secondary | ICD-10-CM | POA: Diagnosis present

## 2019-12-21 DIAGNOSIS — J9621 Acute and chronic respiratory failure with hypoxia: Secondary | ICD-10-CM | POA: Diagnosis present

## 2019-12-21 DIAGNOSIS — I5030 Unspecified diastolic (congestive) heart failure: Secondary | ICD-10-CM | POA: Diagnosis present

## 2019-12-21 DIAGNOSIS — J690 Pneumonitis due to inhalation of food and vomit: Secondary | ICD-10-CM | POA: Diagnosis present

## 2019-12-21 DIAGNOSIS — G8929 Other chronic pain: Secondary | ICD-10-CM | POA: Diagnosis present

## 2019-12-21 DIAGNOSIS — J441 Chronic obstructive pulmonary disease with (acute) exacerbation: Secondary | ICD-10-CM | POA: Diagnosis present

## 2019-12-21 DIAGNOSIS — R0781 Pleurodynia: Secondary | ICD-10-CM | POA: Diagnosis present

## 2019-12-21 DIAGNOSIS — E871 Hypo-osmolality and hyponatremia: Secondary | ICD-10-CM | POA: Diagnosis present

## 2019-12-21 MED ORDER — PANTOPRAZOLE SODIUM 40 MG IV SOLR
40.0000 mg | Freq: Two times a day (BID) | INTRAVENOUS | Status: DC
  Administered 2019-12-21 – 2019-12-26 (×10): 40 mg via INTRAVENOUS
  Filled 2019-12-21 (×9): qty 40

## 2019-12-21 MED ORDER — HYDROMORPHONE HCL 1 MG/ML IJ SOLN
0.5000 mg | INTRAMUSCULAR | Status: DC | PRN
  Administered 2019-12-22: 0.5 mg via INTRAVENOUS
  Filled 2019-12-21 (×2): qty 1

## 2019-12-21 MED ORDER — LORAZEPAM 2 MG/ML IJ SOLN
1.0000 mg | Freq: Once | INTRAMUSCULAR | Status: AC
  Administered 2019-12-21: 21:00:00 1 mg via INTRAVENOUS
  Filled 2019-12-21: qty 1

## 2019-12-21 MED ORDER — HYDROMORPHONE HCL 1 MG/ML IJ SOLN
0.5000 mg | Freq: Once | INTRAMUSCULAR | Status: AC
  Administered 2019-12-21: 10:00:00 0.5 mg via INTRAVENOUS
  Filled 2019-12-21: qty 1

## 2019-12-21 NOTE — TOC Progression Note (Signed)
Transition of Care [facility]) - Progression Note    Patient Details  Name: Natasha [name] MRN: [id number] Date of Birth: Dec 09, 1928  [facility] of [facility]) CM/SW Contact  Shelbie Hutching, RN Phone Number: 12/21/2019, 1:32 PM  Clinical Narrative:    TOC to continue to follow patient, patient will discharge back to [facility] once medically cleared.  ENT evaluated yesterday and recommended GI consult.  GI has been consulted waiting for recommendations.      Expected Discharge Plan: [place] Barriers to Discharge: Continued Medical Work up  Expected Discharge Plan and Services Expected Discharge Plan: [place]   Discharge Planning Services: CM Consult Post Acute Care Choice: [place] Living arrangements for the past 2 months: [place] Expected Discharge Date: 12/20/19               DME Arranged: N/A DME Agency: NA       HH Arranged: NA HH Agency: NA         Social Determinants of Health (SDOH) Interventions    Readmission Risk Interventions No flowsheet data found.

## 2019-12-21 NOTE — Progress Notes (Signed)
RT called to patient bedside due to increased oxygen requirements. Patient previously on [place], now on [place] with increased work of breathing. Bilateral Rhonchi noted. Patient repositioned in bed and scheduled breathing treatments given at this time. SAT 100% on NRBM. RN and NP aware. Will continue to monitor.

## 2019-12-21 NOTE — Progress Notes (Signed)
Pt with d/c O2 stats. Increased work of breathing tripod posture. NRB placed. RT and and NP, Rufina Falco contacted. See new orders placed. Night shift RN and Agricultural consultant aware and active in care continuing to monitor pt.

## 2019-12-21 NOTE — Progress Notes (Signed)
PROGRESS NOTE    Natasha [name]  WUJ:[id number]  DOB: 04-11-1928  DOA: 12/12/2019 PCP: Venia Carbon, MD Outpatient Specialists:   Hospital course:  86 year old female with PMH significant for HFpEF class III, COPD, DM 2, s/p AKA, HTN, CAD, diverticulosis and GERD was sent over from her pulmonologist Dr. Zoila Shutter office due to worsening respiratory status.  Patient has been coughing for the past 2 to 3 days with difficulty clearing her mucus and shortness of breath not improved with inhaler.  In the ED patient was noted to have increased work of breathing but able to maintain O2 saturations on 6 L nasal cannula and abnormal CT.  CTA was negative for PE but did show possible left base opacity suggestive of pneumonia.  Patient was noted be afebrile but did have leukocytosis at 16.  She was admitted for COPD exacerbation with community-acquired pneumonia.  She was started on ceftriaxone azithromycin.  CXR on 12/18/2019 and 12/19/2019 continue to demonstrate pulmonary vascular congestion, atelectasis and large hiatal hernia. Subjective: The patient's cough is improved, and she is on her baseline oxygen requirement. On my visit the patient denied any issues. I told her that the plan was to discharge her to home. She was in agreement. Later in the day I received a message from nursing stating that the patient had complained of "something caught in the back of her throat". She also required a small increase in her FIO2 at this time.   Objective: Vitals:   12/21/19 0040 12/21/19 0527 12/21/19 0738 12/21/19 0738  BP: (!) 141/56 (!) 138/51  (!) 132/51  Pulse: 80 85  73  Resp: 18 20  16   Temp: 97.9 F (36.6 C) 97.7 F (36.5 C)  97.6 F (36.4 C)  TempSrc: Oral Oral    SpO2: 90% 90% 92% 92%  Weight:    59.5 kg  Height:        Intake/Output Summary (Last 24 hours) at 12/21/2019 1312 Last data filed at 12/21/2019 0602 Gross per 24 hour  Intake 240 ml  Output 950 ml  Net -710 ml    Filed Weights   12/21/19 0738  Weight: 59.5 kg    Exam:  Constitutional:  The patient is awake, alert, and oriented x 3. She continues to have pleuritic chest pain with deep respiration.   Respiratory:  . No increased work of breathing. Marland Kitchen Positive for wheezes and rhonchi. No rales . No tactile fremitus Cardiovascular:  . Regular rate and rhythm . No murmurs, ectopy, or gallups. . No lateral PMI. No thrills. Abdomen:  . Abdomen is soft, non-tender, non-distended . No hernias, masses, or organomegaly . Normoactive bowel sounds.  Musculoskeletal:  . No cyanosis, clubbing, or edema Skin:  . No rashes, lesions, ulcers . palpation of skin: no induration or nodules Neurologic:  . CN 2-12 intact . Sensation all 4 extremities intact Psychiatric:  . Mental status o Mood, affect appropriate o Orientation to person, place, time  . judgment and insight appear intact  Assessment & Plan:   Acute on chronic respiratory failure secondary to CAP and COPD exacerbation and pulmonary vascular congestion: Improving slowly. Will check Echocardiogram as CXR continues to demonstrate plmonary vascular congestion. Also continue inhaled bronchodilator therapy, and steroids. She has completed 5 days of IV antibiotics for CAP.  Tessalon Perles and low dose Oxycodone for cough suppressant are helpful as well. Continue DuoNebs every 6 hours while awake. Continue diuresis. - 2.5 L with regard to fluid balance. Mucomyst nebulizer to  help with expectoration. Taper steroids. The patient was on baseline O2 requirements and was to be discharged to home. However,  later in the day I received a message from nursing stating that the patient had complained of "something caught in the back of her throat". She also required a small increase in her FIO2 at this time. I re-evalauted the patient. She was in no distress. X-ray of the soft tissues of the neck were performed and suggested prevertebral/retropharyngeal  swelling/inflammation. Dr. Pryor Ochoa of ENT was consulted and CT of the soft tissues was ordered to more closely evaluate this abnormality. Discharge was cancelled.  Pleuritic chest pain: Improved somewhat, but still present. Not helped by oxycodone yesterday. I have started a low dose of toradol q 6 to help with this. Will only continue this for a couple of days as the patient is also on steroids. Wean steroids.  Hyponatremia: Resolved. Possible some level of SIADH and/or intravascular fluid depletion given increased insensible losses.Continue to hold metolazone.  Chronic pain with new acute pleuriticchest pain with coughing Oxycodone 2.5 mg every 8 hours was added as needed for chest pain with coughing. Not really helping. Toradol added and seems to help.   HFpEF: At baseline. Pulmonary vascular congestion on CXR. Check echocardiogram. Continue oral lasix and spironolactone. Continue Lasix and spironolactone per home dose. Monitor volume status. The patient is currently negative 2.574 in terms of fluid balance.  PVD Patient continues on Eliquis and aspirin per home doses  Anxiety and depression Continue escitalopram and lorazepam per home doses  Hyperlipidemia: Continue simvastatin  I have seen and examined this patient myself. I have spent 44 minutes in her evaluation and care.  DVT prophylaxis: On Eliquis Code Status: Full code Family Communication: I have discussed the patient in detail with her and with her friend who was in attendance. Disposition Plan:   Patient is from: Home  Anticipated Discharge Location: Home  Barriers to Discharge: Evaluation for retropharyngeal/prevertebral swelling possbly compromising respiratory status.  Is patient medically stable for Discharge: No  Consultants: Dr. Pryor Ochoa, ENT  Procedures:  None  Antimicrobials:  Ceftriaxone started 12/31/2019  Azithromycin started 12/16/2019   Data Reviewed:  Basic Metabolic Panel: Recent Labs  Lab  12/15/19 0515 12/16/19 0501 12/17/19 0429 12/18/19 0832 12/19/19 0525  NA 130* 131* 136 133* 137  K 3.4* 4.3 4.7 4.4 4.2  CL 88* 91* 94* 91* 95*  CO2 30 30 34* 31 33*  GLUCOSE 162* 145* 114* 183* 156*  BUN 23 24* 21 28* 38*  CREATININE 0.92 0.82 0.74 0.76 0.83  CALCIUM 9.2 9.3 9.6 9.3 9.6  MG  --   --   --  2.4 2.3  PHOS  --   --   --  2.6 2.6   Liver Function Tests: Recent Labs  Lab 12/15/19 0515 12/18/19 0832 12/19/19 0525  AST 25 19 18   ALT 18 18 17   ALKPHOS 75 72 56  BILITOT 0.6 0.3 0.5  PROT 6.5 6.3* 5.9*  ALBUMIN 2.9* 3.1* 2.9*   No results for input(s): LIPASE, AMYLASE in the last 168 hours. No results for input(s): AMMONIA in the last 168 hours. CBC: Recent Labs  Lab 12/16/19 0501 12/17/19 0429 12/18/19 0832 12/19/19 0525 12/20/19 0420  WBC 10.9* 12.3* 11.5* 9.9 17.4*  NEUTROABS 9.6* 9.4* 10.3* 9.2* 15.5*  HGB 10.3* 10.7* 11.1* 9.4* 9.7*  HCT 32.7* 34.0* 35.0* 30.3* 30.2*  MCV 86.3 86.3 86.6 88.1 86.8  PLT 301 300 320 290 310   Cardiac  Enzymes: No results for input(s): CKTOTAL, CKMB, CKMBINDEX, TROPONINI in the last 168 hours. BNP (last 3 results) No results for input(s): PROBNP in the last 8760 hours. CBG: No results for input(s): GLUCAP in the last 168 hours.  Recent Results (from the past 240 hour(s))  Resp Panel by RT-PCR (Flu A&B, Covid) Nasopharyngeal Swab     Status: None   Collection Time: 12/23/2019  3:35 PM   Specimen: Nasopharyngeal Swab; Nasopharyngeal(NP) swabs in vial transport medium  Result Value Ref Range Status   SARS Coronavirus 2 by RT PCR NEGATIVE NEGATIVE Final    Comment: (NOTE) SARS-CoV-2 target nucleic acids are NOT DETECTED.  The SARS-CoV-2 RNA is generally detectable in upper respiratory specimens during the acute phase of infection. The lowest concentration of SARS-CoV-2 viral copies this assay can detect is 138 copies/mL. A negative result does not preclude SARS-Cov-2 infection and should not be used as the sole  basis for treatment or other patient management decisions. A negative result may occur with  improper specimen collection/handling, submission of specimen other than nasopharyngeal swab, presence of viral mutation(s) within the areas targeted by this assay, and inadequate number of viral copies(<138 copies/mL). A negative result must be combined with clinical observations, patient history, and epidemiological information. The expected result is Negative.  Fact Sheet for Patients:  EntrepreneurPulse.com.au  Fact Sheet for Healthcare Providers:  IncredibleEmployment.be  This test is no t yet approved or cleared by the [place] FDA and  has been authorized for detection and/or diagnosis of SARS-CoV-2 by FDA under an Emergency Use Authorization (EUA). This EUA will remain  in effect (meaning this test can be used) for the duration of the COVID-19 declaration under Section 564(b)(1) of the Act, 21 U.S.C.section 360bbb-3(b)(1), unless the authorization is terminated  or revoked sooner.       Influenza A by PCR NEGATIVE NEGATIVE Final   Influenza B by PCR NEGATIVE NEGATIVE Final    Comment: (NOTE) The Xpert Xpress SARS-CoV-2/FLU/RSV plus assay is intended as an aid in the diagnosis of influenza from Nasopharyngeal swab specimens and should not be used as a sole basis for treatment. Nasal washings and aspirates are unacceptable for Xpert Xpress SARS-CoV-2/FLU/RSV testing.  Fact Sheet for Patients: EntrepreneurPulse.com.au  Fact Sheet for Healthcare Providers: IncredibleEmployment.be  This test is not yet approved or cleared by the [place] FDA and has been authorized for detection and/or diagnosis of SARS-CoV-2 by FDA under an Emergency Use Authorization (EUA). This EUA will remain in effect (meaning this test can be used) for the duration of the COVID-19 declaration under Section 564(b)(1) of the Act,  21 U.S.C. section 360bbb-3(b)(1), unless the authorization is terminated or revoked.  Performed at [facility], [address] [place], [place] [address]   MRSA PCR Screening     Status: Abnormal   Collection Time: 12/27/2019  5:02 PM   Specimen: Nasal Mucosa; Nasopharyngeal  Result Value Ref Range Status   MRSA by PCR POSITIVE (A) NEGATIVE Final    Comment:        The GeneXpert MRSA Assay (FDA approved for NASAL specimens only), is one component of a comprehensive MRSA colonization surveillance program. It is not intended to diagnose MRSA infection nor to guide or monitor treatment for MRSA infections. RESULT CALLED TO, READ BACK BY AND VERIFIED WITH: WILACYNC STOVER 12/15/19 AT 2000 BY ACR Performed at [facility], [address] [place], [place] [address]   Blood culture (routine x 2)     Status:  None   Collection Time: 12/31/2019  5:28 PM   Specimen: BLOOD  Result Value Ref Range Status   Specimen Description BLOOD LEFT FOREARM  Final   Special Requests   Final    BOTTLES DRAWN AEROBIC AND ANAEROBIC Blood Culture adequate volume   Culture   Final    NO GROWTH 5 DAYS Performed at [facility], [address], [place], [place] [address]    Report Status 12/19/2019 FINAL  Final  Blood culture (routine x 2)     Status: None   Collection Time: 12/30/2019  5:28 PM   Specimen: BLOOD  Result Value Ref Range Status   Specimen Description BLOOD RIGHT ASSIST CONTROL  Final   Special Requests   Final    BOTTLES DRAWN AEROBIC AND ANAEROBIC Blood Culture adequate volume   Culture   Final    NO GROWTH 5 DAYS Performed at [facility], [address], [place], [place] [address]    Report Status 12/19/2019 FINAL  Final  SARS Coronavirus 2 by RT PCR (hospital order, performed in [facility] hospital lab) Nasopharyngeal Nasopharyngeal Swab     Status: None   Collection Time: 12/20/19  9:37 AM   Specimen: Nasopharyngeal Swab  Result Value Ref  Range Status   SARS Coronavirus 2 NEGATIVE NEGATIVE Final    Comment: (NOTE) SARS-CoV-2 target nucleic acids are NOT DETECTED.  The SARS-CoV-2 RNA is generally detectable in upper and lower respiratory specimens during the acute phase of infection. The lowest concentration of SARS-CoV-2 viral copies this assay can detect is 250 copies / mL. A negative result does not preclude SARS-CoV-2 infection and should not be used as the sole basis for treatment or other patient management decisions.  A negative result may occur with improper specimen collection / handling, submission of specimen other than nasopharyngeal swab, presence of viral mutation(s) within the areas targeted by this assay, and inadequate number of viral copies (<250 copies / mL). A negative result must be combined with clinical observations, patient history, and epidemiological information.  Fact Sheet for Patients:   StrictlyIdeas.no  Fact Sheet for Healthcare Providers: BankingDealers.co.za  This test is not yet approved or  cleared by the [place] FDA and has been authorized for detection and/or diagnosis of SARS-CoV-2 by FDA under an Emergency Use Authorization (EUA).  This EUA will remain in effect (meaning this test can be used) for the duration of the COVID-19 declaration under Section 564(b)(1) of the Act, 21 U.S.C. section 360bbb-3(b)(1), unless the authorization is terminated or revoked sooner.  Performed at [facility], [address] [place], [place] [address]       Studies: DG Neck Soft Tissue  Result Date: 12/20/2019 CLINICAL DATA:  Difficulty swallowing over the last 2 days. Hoarseness. Assess for foreign object. EXAM: NECK SOFT TISSUES - 1+ VIEW COMPARISON:  None. FINDINGS: No foreign object is visible. Patient does appear to have some prevertebral/retropharyngeal soft tissue swelling that could go along with inflammatory change.  Consider CT of the neck if concern persists. IMPRESSION: 1. No foreign object visible. 2. Prevertebral/retropharyngeal soft tissue swelling that could go along with inflammatory change. Consider CT if concern persists. Electronically Signed   By: Nelson Chimes M.D.   On: 12/20/2019 11:31   CT SOFT TISSUE NECK W CONTRAST  Result Date: 12/20/2019 CLINICAL DATA:  86 year old female with cellulitis. Prevertebral soft tissue swelling on neck radiographs today. EXAM: CT NECK WITH CONTRAST TECHNIQUE: Multidetector CT imaging of the neck was performed  using the standard protocol following the bolus administration of intravenous contrast. CONTRAST:  78mL OMNIPAQUE IOHEXOL 300 MG/ML  SOLN COMPARISON:  Neck radiographs earlier today.  Chest CTA 12/24/2019. FINDINGS: Pharynx and larynx: Retropharyngeal soft tissue thickening appears to have been related to a retropharyngeal course of both carotid arteries (series 2, image 37, normal variant). No retropharyngeal or parapharyngeal space fluid or inflammation. Larynx and pharynx soft tissue contours are within normal limits. Salivary glands: Negative sublingual space. Submandibular glands and parotid glands are within normal limits. Thyroid: Negative. Lymph nodes: Negative.  No cervical lymphadenopathy. Vascular: Major vascular structures in the neck and at the skull base are patent. Retropharyngeal course of both cervical carotids. Calcified atherosclerosis in the neck and at the skull base. Dominant appearing left vertebral artery. Limited intracranial: Negative. Visualized orbits: Postoperative changes to both globes, otherwise negative. Mastoids and visualized paranasal sinuses: Fairly low-density fluid levels in both maxillary sinuses. Minor bubbly opacity in some posterior ethmoid air cells. Other visible paranasal sinuses are clear. Previous bilateral mastoidectomy and bilateral cochlear implants. Mastoid and tympanic cavities appear clear. Skeleton: Osteopenia. Absent  maxillary dentition. Degenerative changes in the cervical spine. No acute osseous abnormality identified. Upper chest: Dilated and fluid-filled thoracic esophagus (series 2, image 73 and series 6, image 61. The esophagus had a similar caliber on 12/13/2019 but was air-filled at that time. Underlying hiatal hernia noted on that exam. No superior mediastinal lymphadenopathy. Calcified aortic atherosclerosis. Lung apices are stable aside from mild atelectasis. IMPRESSION: 1. New dilated and fluid-filled visible Thoracic Esophagus. Underlying hiatal hernia noted on recent chest CTA. Top differential considerations include gastroesophageal reflux, gastric outlet obstruction. 2. No acute or inflammatory process identified in the neck. Retropharyngeal soft tissue contour explained by retropharyngeal course of both cervical carotids (normal variant). 3. Mild acute bilateral maxillary sinusitis. 4. Aortic Atherosclerosis (ICD10-I70.0). Electronically Signed   By: Genevie Ann M.D.   On: 12/20/2019 15:58   ECHOCARDIOGRAM COMPLETE  Result Date: 12/19/2019    ECHOCARDIOGRAM REPORT   Patient Name:   Natasha [name] Date of Exam: 12/19/2019 Medical Rec #:  [id number]           Height:       59.0 in Accession #:    [id number]          Weight:       132.0 lb Date of Birth:  1928-03-17           BSA:          1.546 m Patient Age:    31 years            BP:           127/69 mmHg Patient Gender: F                   HR:           82 bpm. Exam Location:  [facility] Procedure: 2D Echo, Color Doppler and Cardiac Doppler Indications:     CHF- acute systolic 268.34  History:         Patient has no prior history of Echocardiogram examinations.                  COPD; Risk Factors:Hypertension. History of blood clots, PVD.  Sonographer:     Sherrie Sport RDCS (AE) Referring Phys:  [id number] Naydelin Ziegler Benny Lennert Diagnosing Phys: Bartholome Bill MD IMPRESSIONS  1. Left ventricular ejection fraction, by estimation, is 60 to 65%. The left ventricle has normal function. The  left ventricle has no regional wall motion abnormalities. There is moderate left ventricular hypertrophy. Left ventricular diastolic parameters were normal.  2. Right ventricular systolic function is normal. The right ventricular size is normal.  3. The mitral valve was not well visualized. No evidence of mitral valve regurgitation.  4. The aortic valve was not well visualized. Aortic valve regurgitation is not visualized. FINDINGS  Left Ventricle: Left ventricular ejection fraction, by estimation, is 60 to 65%. The left ventricle has normal function. The left ventricle has no regional wall motion abnormalities. The left ventricular internal cavity size was normal in size. There is  moderate left ventricular hypertrophy. Left ventricular diastolic parameters were normal. Right Ventricle: The right ventricular size is normal. No increase in right ventricular wall thickness. Right ventricular systolic function is normal. Left Atrium: Left atrial size was normal in size. Right Atrium: Right atrial size was normal in size. Pericardium: There is no evidence of pericardial effusion. Mitral Valve: The mitral valve was not well visualized. No evidence of mitral valve regurgitation. Tricuspid Valve: The tricuspid valve is not well visualized. Tricuspid valve regurgitation is not demonstrated. Aortic Valve: The aortic valve was not well visualized. Aortic valve regurgitation is not visualized. Pulmonic Valve: The pulmonic valve was not well visualized. Pulmonic valve regurgitation is not visualized. Aorta: The aortic root is normal in size and structure and the aortic root was not well visualized. IAS/Shunts: The interatrial septum was not assessed.  LEFT VENTRICLE PLAX 2D LVIDd:         2.99 cm LVIDs:         2.04 cm LV PW:         1.00 cm LV IVS:        1.02 cm LVOT diam:     2.00 cm LVOT Area:     3.14 cm  LEFT ATRIUM         Index LA diam:    2.30 cm 1.49 cm/m                        PULMONIC VALVE AORTA                  PV Vmax:        0.80 m/s Ao Root diam: 2.40 cm PV Peak grad:   2.6 mmHg                       RVOT Peak grad: 4 mmHg   SHUNTS Systemic Diam: 2.00 cm Bartholome Bill MD Electronically signed by Bartholome Bill MD Signature Date/Time: 12/19/2019/4:40:55 PM    Final      Scheduled Meds: . apixaban  2.5 mg Oral BID  . arformoterol  15 mcg Nebulization BID  . aspirin EC  81 mg Oral Daily  . budesonide (PULMICORT) nebulizer solution  0.25 mg Nebulization BID  . calcium-vitamin D  1 tablet Oral Daily  . Chlorhexidine Gluconate Cloth  6 each Topical Q0600  . escitalopram  10 mg Oral Daily  . furosemide  40 mg Oral Daily  . guaiFENesin  1,200 mg Oral BID  . ipratropium-albuterol  3 mL Nebulization TID  . montelukast  10 mg Oral QHS  . multivitamin with minerals   Oral Daily  . mupirocin ointment  1 application Nasal BID  . pramipexole  0.25 mg Oral Daily  . predniSONE  40 mg Oral Q breakfast  . simvastatin  20 mg Oral QHS  . spironolactone  25 mg Oral Daily   Continuous Infusions: . sodium chloride Stopped (12/15/19 2126)  . ampicillin-sulbactam (UNASYN) IV 3 g (12/21/19 1022)    Active Problems:   CAP (community acquired pneumonia)  I have seen and examined this patient myself. I have spent 44 minutes in her evaluation and care.  Karis Rilling, DO [facility]  If 7PM-7AM, please contact night-coverage [email or web address] Password [email or web address] 12/21/2019, 1:12 PM    LOS: 7 days

## 2019-12-21 NOTE — Progress Notes (Signed)
PROGRESS NOTE    Natasha [name]  SKA:[id number]  DOB: [birth year]  DOA: 12/15/2019 PCP: Venia Carbon, MD Outpatient Specialists:   Hospital course:  86 year old female with PMH significant for HFpEF class III, COPD, DM 2, s/p AKA, HTN, CAD, diverticulosis and GERD was sent over from her pulmonologist Dr. Zoila Shutter office due to worsening respiratory status.  Patient has been coughing for the past 2 to 3 days with difficulty clearing her mucus and shortness of breath not improved with inhaler.  In the ED patient was noted to have increased work of breathing but able to maintain O2 saturations on 6 L nasal cannula and abnormal CT.  CTA was negative for PE but did show possible left base opacity suggestive of pneumonia.  Patient was noted be afebrile but did have leukocytosis at 16.  She was admitted for COPD exacerbation with community-acquired pneumonia.  She was started on ceftriaxone azithromycin.  CXR on 12/18/2019 and 12/19/2019 continue to demonstrate pulmonary vascular congestion, atelectasis and large hiatal hernia.  On the morning of 12/20/2019, the patient was at her baseline oxygen requirements. She was to return to [facility]. However,  later in the day I received a message from nursing stating that the patient had complained of "something caught in the back of her throat". She also required a small increase in her FIO2 at this time.  I re-evalauted the patient. She was in no distress. X-ray of the soft tissues of the neck were performed and suggested prevertebral/retropharyngeal swelling/inflammation. Dr. Pryor Ochoa of ENT was consulted and CT of the soft tissues was ordered to more closely evaluate this abnormality. Discharge was cancelled. Dr. Pryor Ochoa evaluated the CT and X-ray and the patient. There is no swelling, retained object or mass at the back of the throat. What the patient is feeling is a distended esophagus full of fluid. This most likely is the source of the  patient's pneumonia, and the cause of her minor desaturation on 12/20/2019. Dr. Marius Ditch has been consulted for GI to evaluate the patient for achalasia.  Subjective: The patient is discussing financial matters with her financial planner when I arrive for my visit this morning. She does continue to complain of the sensation that there is something in the back of her throat that won't come up. I explain the CT findings, and Dr. Darien Ramus recommendations. I explain that GI has been consulted. Objective: Vitals:   12/21/19 0040 12/21/19 0527 12/21/19 0738 12/21/19 0738  BP: (!) 141/56 (!) 138/51  (!) 132/51  Pulse: 80 85  73  Resp: 18 20  16   Temp: 97.9 F (36.6 C) 97.7 F (36.5 C)  97.6 F (36.4 C)  TempSrc: Oral Oral    SpO2: 90% 90% 92% 92%  Weight:    59.5 kg  Height:        Intake/Output Summary (Last 24 hours) at 12/21/2019 1335 Last data filed at 12/21/2019 0602 Gross per 24 hour  Intake 240 ml  Output 950 ml  Net -710 ml   Filed Weights   12/21/19 0738  Weight: 59.5 kg    Exam:  Constitutional:  The patient is awake, alert, and oriented x 3. She continues to have pleuritic chest pain with deep respiration and to have the sensation that there is something at the back of her throat that won't move.   Respiratory:  . No increased work of breathing. Marland Kitchen Positive for wheezes and rhonchi. No rales . No tactile fremitus Cardiovascular:  . Regular rate  and rhythm . No murmurs, ectopy, or gallups. . No lateral PMI. No thrills. Abdomen:  . Abdomen is soft, non-tender, non-distended . No hernias, masses, or organomegaly . Normoactive bowel sounds.  Musculoskeletal:  . No cyanosis, clubbing, or edema Skin:  . No rashes, lesions, ulcers . palpation of skin: no induration or nodules Neurologic:  . CN 2-12 intact . Sensation all 4 extremities intact Psychiatric:  . Mental status o Mood, affect appropriate o Orientation to person, place, time  . judgment and insight appear  intact  Consultants: Dr. Pryor Ochoa, ENT Dr. Marius Ditch, GI  Procedures:  None  Antimicrobials:  Ceftriaxone started 01/06/2020  Azithromycin started 12/28/2019  Data Reviewed:  Basic Metabolic Panel: Recent Labs  Lab 12/15/19 0515 12/16/19 0501 12/17/19 0429 12/18/19 0832 12/19/19 0525  NA 130* 131* 136 133* 137  K 3.4* 4.3 4.7 4.4 4.2  CL 88* 91* 94* 91* 95*  CO2 30 30 34* 31 33*  GLUCOSE 162* 145* 114* 183* 156*  BUN 23 24* 21 28* 38*  CREATININE 0.92 0.82 0.74 0.76 0.83  CALCIUM 9.2 9.3 9.6 9.3 9.6  MG  --   --   --  2.4 2.3  PHOS  --   --   --  2.6 2.6   Liver Function Tests: Recent Labs  Lab 12/15/19 0515 12/18/19 0832 12/19/19 0525  AST 25 19 18   ALT 18 18 17   ALKPHOS 75 72 56  BILITOT 0.6 0.3 0.5  PROT 6.5 6.3* 5.9*  ALBUMIN 2.9* 3.1* 2.9*   No results for input(s): LIPASE, AMYLASE in the last 168 hours. No results for input(s): AMMONIA in the last 168 hours. CBC: Recent Labs  Lab 12/16/19 0501 12/17/19 0429 12/18/19 0832 12/19/19 0525 12/20/19 0420  WBC 10.9* 12.3* 11.5* 9.9 17.4*  NEUTROABS 9.6* 9.4* 10.3* 9.2* 15.5*  HGB 10.3* 10.7* 11.1* 9.4* 9.7*  HCT 32.7* 34.0* 35.0* 30.3* 30.2*  MCV 86.3 86.3 86.6 88.1 86.8  PLT 301 300 320 290 310   Cardiac Enzymes: No results for input(s): CKTOTAL, CKMB, CKMBINDEX, TROPONINI in the last 168 hours. BNP (last 3 results) No results for input(s): PROBNP in the last 8760 hours. CBG: No results for input(s): GLUCAP in the last 168 hours.  Recent Results (from the past 240 hour(s))  Resp Panel by RT-PCR (Flu A&B, Covid) Nasopharyngeal Swab     Status: None   Collection Time: 01/08/2020  3:35 PM   Specimen: Nasopharyngeal Swab; Nasopharyngeal(NP) swabs in vial transport medium  Result Value Ref Range Status   SARS Coronavirus 2 by RT PCR NEGATIVE NEGATIVE Final    Comment: (NOTE) SARS-CoV-2 target nucleic acids are NOT DETECTED.  The SARS-CoV-2 RNA is generally detectable in upper respiratory specimens  during the acute phase of infection. The lowest concentration of SARS-CoV-2 viral copies this assay can detect is 138 copies/mL. A negative result does not preclude SARS-Cov-2 infection and should not be used as the sole basis for treatment or other patient management decisions. A negative result may occur with  improper specimen collection/handling, submission of specimen other than nasopharyngeal swab, presence of viral mutation(s) within the areas targeted by this assay, and inadequate number of viral copies(<138 copies/mL). A negative result must be combined with clinical observations, patient history, and epidemiological information. The expected result is Negative.  Fact Sheet for Patients:  EntrepreneurPulse.com.au  Fact Sheet for Healthcare Providers:  IncredibleEmployment.be  This test is no t yet approved or cleared by the [place] and  has been authorized  for detection and/or diagnosis of SARS-CoV-2 by FDA under an Emergency Use Authorization (EUA). This EUA will remain  in effect (meaning this test can be used) for the duration of the COVID-19 declaration under Section 564(b)(1) of the Act, 21 U.S.C.section 360bbb-3(b)(1), unless the authorization is terminated  or revoked sooner.       Influenza A by PCR NEGATIVE NEGATIVE Final   Influenza B by PCR NEGATIVE NEGATIVE Final    Comment: (NOTE) The Xpert Xpress SARS-CoV-2/FLU/RSV plus assay is intended as an aid in the diagnosis of influenza from Nasopharyngeal swab specimens and should not be used as a sole basis for treatment. Nasal washings and aspirates are unacceptable for Xpert Xpress SARS-CoV-2/FLU/RSV testing.  Fact Sheet for Patients: EntrepreneurPulse.com.au  Fact Sheet for Healthcare Providers: IncredibleEmployment.be  This test is not yet approved or cleared by the [place] FDA and has been authorized for detection  and/or diagnosis of SARS-CoV-2 by FDA under an Emergency Use Authorization (EUA). This EUA will remain in effect (meaning this test can be used) for the duration of the COVID-19 declaration under Section 564(b)(1) of the Act, 21 U.S.C. section 360bbb-3(b)(1), unless the authorization is terminated or revoked.  Performed at [facility], [address] [place], [place] [address]   MRSA PCR Screening     Status: Abnormal   Collection Time: 01/09/2020  5:02 PM   Specimen: Nasal Mucosa; Nasopharyngeal  Result Value Ref Range Status   MRSA by PCR POSITIVE (A) NEGATIVE Final    Comment:        The GeneXpert MRSA Assay (FDA approved for NASAL specimens only), is one component of a comprehensive MRSA colonization surveillance program. It is not intended to diagnose MRSA infection nor to guide or monitor treatment for MRSA infections. RESULT CALLED TO, READ BACK BY AND VERIFIED WITH: WILACYNC STOVER 12/15/19 AT 2000 BY ACR Performed at [facility], [address] [place], [place] [address]   Blood culture (routine x 2)     Status: None   Collection Time: 01/08/2020  5:28 PM   Specimen: BLOOD  Result Value Ref Range Status   Specimen Description BLOOD LEFT FOREARM  Final   Special Requests   Final    BOTTLES DRAWN AEROBIC AND ANAEROBIC Blood Culture adequate volume   Culture   Final    NO GROWTH 5 DAYS Performed at [facility], [address] [place], [place] [address]    Report Status 12/19/2019 FINAL  Final  Blood culture (routine x 2)     Status: None   Collection Time: 12/19/2019  5:28 PM   Specimen: BLOOD  Result Value Ref Range Status   Specimen Description BLOOD RIGHT ASSIST CONTROL  Final   Special Requests   Final    BOTTLES DRAWN AEROBIC AND ANAEROBIC Blood Culture adequate volume   Culture   Final    NO GROWTH 5 DAYS Performed at [facility], [address] [place],  [address]    Report Status 12/19/2019 FINAL  Final   SARS Coronavirus 2 by RT PCR (hospital order, performed in [place] hospital lab) Nasopharyngeal Nasopharyngeal Swab     Status: None   Collection Time: 12/20/19  9:37 AM   Specimen: Nasopharyngeal Swab  Result Value Ref Range Status   SARS Coronavirus 2 NEGATIVE NEGATIVE Final    Comment: (NOTE) SARS-CoV-2 target nucleic acids are NOT DETECTED.  The SARS-CoV-2 RNA is generally detectable in upper and lower respiratory specimens during the acute phase of  infection. The lowest concentration of SARS-CoV-2 viral copies this assay can detect is 250 copies / mL. A negative result does not preclude SARS-CoV-2 infection and should not be used as the sole basis for treatment or other patient management decisions.  A negative result may occur with improper specimen collection / handling, submission of specimen other than nasopharyngeal swab, presence of viral mutation(s) within the areas targeted by this assay, and inadequate number of viral copies (<250 copies / mL). A negative result must be combined with clinical observations, patient history, and epidemiological information.  Fact Sheet for Patients:   StrictlyIdeas.no  Fact Sheet for Healthcare Providers: BankingDealers.co.za  This test is not yet approved or  cleared by the [place] FDA and has been authorized for detection and/or diagnosis of SARS-CoV-2 by FDA under an Emergency Use Authorization (EUA).  This EUA will remain in effect (meaning this test can be used) for the duration of the COVID-19 declaration under Section 564(b)(1) of the Act, 21 U.S.C. section 360bbb-3(b)(1), unless the authorization is terminated or revoked sooner.  Performed at [facility], [address] [place], [place] [address]       Studies: DG Neck Soft Tissue  Result Date: 12/20/2019 CLINICAL DATA:  Difficulty swallowing over the last 2 days. Hoarseness. Assess for foreign object.  EXAM: NECK SOFT TISSUES - 1+ VIEW COMPARISON:  None. FINDINGS: No foreign object is visible. Patient does appear to have some prevertebral/retropharyngeal soft tissue swelling that could go along with inflammatory change. Consider CT of the neck if concern persists. IMPRESSION: 1. No foreign object visible. 2. Prevertebral/retropharyngeal soft tissue swelling that could go along with inflammatory change. Consider CT if concern persists. Electronically Signed   By: Nelson Chimes M.D.   On: 12/20/2019 11:31   CT SOFT TISSUE NECK W CONTRAST  Result Date: 12/20/2019 CLINICAL DATA:  86 year old female with cellulitis. Prevertebral soft tissue swelling on neck radiographs today. EXAM: CT NECK WITH CONTRAST TECHNIQUE: Multidetector CT imaging of the neck was performed using the standard protocol following the bolus administration of intravenous contrast. CONTRAST:  27mL OMNIPAQUE IOHEXOL 300 MG/ML  SOLN COMPARISON:  Neck radiographs earlier today.  Chest CTA 01/06/2020. FINDINGS: Pharynx and larynx: Retropharyngeal soft tissue thickening appears to have been related to a retropharyngeal course of both carotid arteries (series 2, image 37, normal variant). No retropharyngeal or parapharyngeal space fluid or inflammation. Larynx and pharynx soft tissue contours are within normal limits. Salivary glands: Negative sublingual space. Submandibular glands and parotid glands are within normal limits. Thyroid: Negative. Lymph nodes: Negative.  No cervical lymphadenopathy. Vascular: Major vascular structures in the neck and at the skull base are patent. Retropharyngeal course of both cervical carotids. Calcified atherosclerosis in the neck and at the skull base. Dominant appearing left vertebral artery. Limited intracranial: Negative. Visualized orbits: Postoperative changes to both globes, otherwise negative. Mastoids and visualized paranasal sinuses: Fairly low-density fluid levels in both maxillary sinuses. Minor bubbly  opacity in some posterior ethmoid air cells. Other visible paranasal sinuses are clear. Previous bilateral mastoidectomy and bilateral cochlear implants. Mastoid and tympanic cavities appear clear. Skeleton: Osteopenia. Absent maxillary dentition. Degenerative changes in the cervical spine. No acute osseous abnormality identified. Upper chest: Dilated and fluid-filled thoracic esophagus (series 2, image 73 and series 6, image 61. The esophagus had a similar caliber on 12/24/2019 but was air-filled at that time. Underlying hiatal hernia noted on that exam. No superior mediastinal lymphadenopathy. Calcified aortic atherosclerosis. Lung apices are stable aside from mild atelectasis. IMPRESSION:  1. New dilated and fluid-filled visible Thoracic Esophagus. Underlying hiatal hernia noted on recent chest CTA. Top differential considerations include gastroesophageal reflux, gastric outlet obstruction. 2. No acute or inflammatory process identified in the neck. Retropharyngeal soft tissue contour explained by retropharyngeal course of both cervical carotids (normal variant). 3. Mild acute bilateral maxillary sinusitis. 4. Aortic Atherosclerosis (ICD10-I70.0). Electronically Signed   By: Genevie Ann M.D.   On: 12/20/2019 15:58   ECHOCARDIOGRAM COMPLETE  Result Date: 12/19/2019    ECHOCARDIOGRAM REPORT   Patient Name:   RYANNE MORAND Date of Exam: 12/19/2019 Medical Rec #:  [id number]           Height:       59.0 in Accession #:    [id number]          Weight:       132.0 lb Date of Birth:  October 14, 1928           BSA:          1.546 m Patient Age:    30 years            BP:           127/69 mmHg Patient Gender: F                   HR:           82 bpm. Exam Location:  [facility] Procedure: 2D Echo, Color Doppler and Cardiac Doppler Indications:     CHF- acute systolic 638.75  History:         Patient has no prior history of Echocardiogram examinations.                  COPD; Risk Factors:Hypertension. History of blood clots, PVD.   Sonographer:     Sherrie Sport RDCS (AE) Referring Phys:  [id number] Aeva Posey Benny Lennert Diagnosing Phys: Bartholome Bill MD IMPRESSIONS  1. Left ventricular ejection fraction, by estimation, is 60 to 65%. The left ventricle has normal function. The left ventricle has no regional wall motion abnormalities. There is moderate left ventricular hypertrophy. Left ventricular diastolic parameters were normal.  2. Right ventricular systolic function is normal. The right ventricular size is normal.  3. The mitral valve was not well visualized. No evidence of mitral valve regurgitation.  4. The aortic valve was not well visualized. Aortic valve regurgitation is not visualized. FINDINGS  Left Ventricle: Left ventricular ejection fraction, by estimation, is 60 to 65%. The left ventricle has normal function. The left ventricle has no regional wall motion abnormalities. The left ventricular internal cavity size was normal in size. There is  moderate left ventricular hypertrophy. Left ventricular diastolic parameters were normal. Right Ventricle: The right ventricular size is normal. No increase in right ventricular wall thickness. Right ventricular systolic function is normal. Left Atrium: Left atrial size was normal in size. Right Atrium: Right atrial size was normal in size. Pericardium: There is no evidence of pericardial effusion. Mitral Valve: The mitral valve was not well visualized. No evidence of mitral valve regurgitation. Tricuspid Valve: The tricuspid valve is not well visualized. Tricuspid valve regurgitation is not demonstrated. Aortic Valve: The aortic valve was not well visualized. Aortic valve regurgitation is not visualized. Pulmonic Valve: The pulmonic valve was not well visualized. Pulmonic valve regurgitation is not visualized. Aorta: The aortic root is normal in size and structure and the aortic root was not well visualized. IAS/Shunts: The interatrial septum was not assessed.  LEFT VENTRICLE PLAX 2D LVIDd:  2.99 cm  LVIDs:         2.04 cm LV PW:         1.00 cm LV IVS:        1.02 cm LVOT diam:     2.00 cm LVOT Area:     3.14 cm  LEFT ATRIUM         Index LA diam:    2.30 cm 1.49 cm/m                        PULMONIC VALVE AORTA                 PV Vmax:        0.80 m/s Ao Root diam: 2.40 cm PV Peak grad:   2.6 mmHg                       RVOT Peak grad: 4 mmHg   SHUNTS Systemic Diam: 2.00 cm Bartholome Bill MD Electronically signed by Bartholome Bill MD Signature Date/Time: 12/19/2019/4:40:55 PM    Final      Scheduled Meds: . apixaban  2.5 mg Oral BID  . arformoterol  15 mcg Nebulization BID  . aspirin EC  81 mg Oral Daily  . budesonide (PULMICORT) nebulizer solution  0.25 mg Nebulization BID  . calcium-vitamin D  1 tablet Oral Daily  . Chlorhexidine Gluconate Cloth  6 each Topical Q0600  . escitalopram  10 mg Oral Daily  . furosemide  40 mg Oral Daily  . guaiFENesin  1,200 mg Oral BID  . ipratropium-albuterol  3 mL Nebulization TID  . montelukast  10 mg Oral QHS  . multivitamin with minerals   Oral Daily  . mupirocin ointment  1 application Nasal BID  . pramipexole  0.25 mg Oral Daily  . predniSONE  40 mg Oral Q breakfast  . simvastatin  20 mg Oral QHS  . spironolactone  25 mg Oral Daily   Continuous Infusions: . sodium chloride Stopped (12/15/19 2126)  . ampicillin-sulbactam (UNASYN) IV 3 g (12/21/19 1022)    Principal Problem:   Acute on chronic respiratory failure with hypoxia (HCC) Active Problems:   CAP (community acquired pneumonia)   Aspiration pneumonia of both lower lobes due to gastric secretions (HCC)   Achalasia   Pleuritic chest pain   Hyponatremia   Chronic pain   (HFpEF) heart failure with preserved ejection fraction (HCC)   COPD with acute exacerbation ([place])  Assessment & Plan:   Acute on chronic respiratory failure secondary to CAP and COPD exacerbation and pulmonary vascular congestion: Improving slowly. Will check Echocardiogram as CXR continues to demonstrate plmonary  vascular congestion. Also continue inhaled bronchodilator therapy, and steroids. She has completed 5 days of IV antibiotics for CAP.  Tessalon Perles and low dose Oxycodone for cough suppressant are helpful as well. Continue DuoNebs every 6 hours while awake. Continue diuresis. - 2.5 L with regard to fluid balance. Mucomyst nebulizer to help with expectoration. Taper steroids. The patient was on baseline O2 requirements and was to be discharged to home. However,  later in the day I received a message from nursing stating that the patient had complained of "something caught in the back of her throat". She also required a small increase in her FIO2 at this time. I re-evalauted the patient. She was in no distress. X-ray of the soft tissues of the neck were performed and suggested prevertebral/retropharyngeal swelling/inflammation. Dr. Pryor Ochoa of ENT  was consulted and CT of the soft tissues was ordered to more closely evaluate this abnormality. Discharge was cancelled.  Dr. Pryor Ochoa evaluated the CT and X-ray and the patient. There is no swelling, retained object or mass at the back of the throat. What the patient is feeling is a distended esophagus full of fluid. This most likely is the source of the patient's pneumonia, and the cause of her minor desaturation on 12/20/2019. Dr. Marius Ditch has been consulted for GI to evaluate the patient for achalasia.  Achalasia:  I received a message from nursing stating that the patient had complained of "something caught in the back of her throat". She also required a small increase in her FIO2 at this time. I re-evalauted the patient. She was in no distress. X-ray of the soft tissues of the neck were performed and suggested prevertebral/retropharyngeal swelling/inflammation. Dr. Pryor Ochoa of ENT was consulted and CT of the soft tissues was ordered to more closely evaluate this abnormality. Discharge was cancelled.  Dr. Pryor Ochoa evaluated the CT and X-ray and the patient. There is no  swelling, retained object or mass at the back of the throat. What the patient is feeling is a distended esophagus full of fluid. This most likely is the source of the patient's pneumonia, and the cause of her minor desaturation on 12/20/2019. Dr. Marius Ditch has been consulted for GI to evaluate the patient for achalasia. Esophagram has been ordered by Dr. Marius Ditch.   Pleuritic chest pain: Improved somewhat, but still present. Not helped by oxycodone yesterday. I have started a low dose of toradol q 6 to help with this. Will only continue this for a couple of days as the patient is also on steroids. Wean steroids.  Hyponatremia: Resolved. Possible some level of SIADH and/or intravascular fluid depletion given increased insensible losses.Continue to hold metolazone.  Chronic pain with new acute pleuriticchest pain with coughing Oxycodone 2.5 mg every 8 hours was added as needed for chest pain with coughing. Not really helping. Toradol added and seems to help.   HFpEF: At baseline. Pulmonary vascular congestion on CXR. Check echocardiogram. Continue oral lasix and spironolactone. Continue Lasix and spironolactone per home dose. Monitor volume status. The patient is currently negative 2.574 in terms of fluid balance.  PVD Patient continues on Eliquis and aspirin per home doses  Anxiety and depression Continue escitalopram and lorazepam per home doses  Hyperlipidemia: Continue simvastatin  I have seen and examined this patient myself. I have spent 44 minutes in her evaluation and care.  DVT prophylaxis: On Eliquis Code Status: Full code Family Communication: I have discussed the patient in detail with her husband. All questions answered to the best of my ability. Disposition Plan:   Patient is from: Home  Anticipated Discharge Location: Home  Barriers to Discharge: Evaluation for retropharyngeal/prevertebral swelling possbly compromising respiratory status.  Is patient medically stable for Discharge:  No  I have seen and examined this patient myself. I have spent 44 minutes in her evaluation and care.  Devaris Quirk, DO [facility]  If 7PM-7AM, please contact night-coverage [email or web address] Password [email or web address] 12/21/2019, 1:35 PM    LOS: 7 days

## 2019-12-21 NOTE — Progress Notes (Signed)
PT Cancellation Note  Patient Details Name: Natasha [name] MRN: [id number] DOB: 1928-05-18   Cancelled Treatment:     PT attempt 3rd attempt today. Pt is awaiting on going diagnostic testing. Visibly labored breathing even at rest. Pt unwilling to participate in PT session. Acute PT will continue to follow and progress as able per pt tolerance.     Willette Pa 12/21/2019, 3:12 PM

## 2019-12-21 NOTE — Consult Note (Signed)
Natasha Darby, MD [address]  [place]  [place], [place] [address]  Main: [phone]  Fax: [phone] Pager: [phone]   Consultation  Referring Provider:     No ref. provider found Primary Care Physician:  Venia Carbon, MD Primary Gastroenterologist: Althia Forts     Reason for Consultation:     Dysphagia  Date of Admission:  01/01/2020 Date of Consultation:  12/21/2019         HPI:   Natasha [name] is a 86 y.o. female with with history of COPD on 3 L of oxygen at home, cardiomegaly, hypertension who was admitted on 12/3 secondary to community-acquired pneumonia and COPD exacerbation.  She was treated for it and was ready to be discharged home yesterday, however discharge was held as patient complained of something caught in the back of her throat as well as slight increase in her oxygen requirement.  Urgent evaluation by ENT was performed after the x-ray was concerning for prevertebral/retropharyngeal swelling/inflammation.  CT soft tissue neck did not reveal any abnormalities in the throat, she had fluid-filled thoracic esophagus and therefore GI is consulted for further evaluation for possible achalasia.  CT chest reveals large hiatal hernia  Patient's last dose of Eliquis was last night When I saw the patient, she is asking if she can eat something.  She denies abdominal pain, nausea or vomiting.  She is hard of hearing and has cochlear implant.  She has history of left BKA  NSAIDs: None  Antiplts/Anticoagulants/Anti thrombotics: Eliquis for history of blood clots, last dose on 12/9 PM  GI Procedures: 2015 by Dr. Rayann Heman  Past Medical History:  Diagnosis Date  . Anemia   . Arthritis   . Asthma   . Bilateral pneumonia may 2017  . Bronchitis   . Cardiomegaly   . Chronic respiratory failure with hypoxia ([place])   . Cochlear implant in place    bilateral, [name], 04/26/2007   . Gallstones 2015  . GERD (gastroesophageal reflux disease)   .  H/O blood clots 2014  . History of home oxygen therapy    at night  . Hypertension   . Hypokalemia   . Peripheral vascular disease ([place])    with arterial clots- on xarelto  . Polyneuropathy   . Renal disorder    Chronic Kidney Disease, Stage 5  . Restless leg syndrome     Past Surgical History:  Procedure Laterality Date  . ABDOMINAL HYSTERECTOMY  1960  . APPENDECTOMY  1946  . BREAST BIOPSY Bilateral    negative  . CHOLECYSTECTOMY  04-24-13  . COCHLEAR IMPLANT  2009  . COLONOSCOPY  2015   Dr. Rayann Heman   . EYE SURGERY  2008,2009   cataract  . KYPHOPLASTY N/A 08/19/2015   Procedure: KYPHOPLASTY;  Surgeon: Hessie Knows, MD;  Location: [facility];  Service: Orthopedics;  Laterality: N/A;  . PERIPHERAL VASCULAR CATHETERIZATION N/A 02/27/2015   Procedure: Abdominal Aortogram w/Lower Extremity;  Surgeon: Algernon Huxley, MD;  Location: [facility];  Service: Cardiovascular;  Laterality: N/A;  . PERIPHERAL VASCULAR CATHETERIZATION  02/27/2015   Procedure: Lower Extremity Intervention;  Surgeon: Algernon Huxley, MD;  Location: [facility] [facility];  Service: Cardiovascular;;  . PERIPHERAL VASCULAR CATHETERIZATION Left 02/28/2015   Procedure: Lower Extremity Angiography;  Surgeon: Algernon Huxley, MD;  Location: [place] [facility];  Service: Cardiovascular;  Laterality: Left;  . PERIPHERAL VASCULAR CATHETERIZATION  02/28/2015   Procedure: Lower Extremity Intervention;  Surgeon: Algernon Huxley, MD;  Location: [facility];  Service: Cardiovascular;;  . PERIPHERAL VASCULAR CATHETERIZATION Left 05/22/2015   Procedure: Lower Extremity Angiography;  Surgeon: Algernon Huxley, MD;  Location: [place] [facility];  Service: Cardiovascular;  Laterality: Left;  . PERIPHERAL VASCULAR CATHETERIZATION  05/22/2015   Procedure: Lower Extremity Intervention;  Surgeon: Algernon Huxley, MD;  Location: [facility];  Service: Cardiovascular;;  . PERIPHERAL VASCULAR CATHETERIZATION Left 05/23/2015   Procedure: Lower  Extremity Angiography;  Surgeon: Algernon Huxley, MD;  Location: [place] [facility];  Service: Cardiovascular;  Laterality: Left;  . PERIPHERAL VASCULAR CATHETERIZATION  05/23/2015   Procedure: Lower Extremity Intervention;  Surgeon: Algernon Huxley, MD;  Location: [facility];  Service: Cardiovascular;;  . stent placement  2006-2014   multiple stent placements in legs    Current Facility-Administered Medications:  .  0.9 %  sodium chloride infusion, , Intravenous, PRN, Vashti Hey, MD, Stopped at 12/15/19 2126 .  acetaminophen (TYLENOL) tablet 650 mg, 650 mg, Oral, Q6H PRN, Bonnell Public Tublu, MD, 650 mg at 12/18/19 2230 .  albuterol (VENTOLIN HFA) 108 (90 Base) MCG/ACT inhaler 2 puff, 2 puff, Inhalation, Q6H PRN, Cox, Amy N, DO, 2 puff at 12/20/19 2140 .  Ampicillin-Sulbactam (UNASYN) 3 g in sodium chloride 0.9 % 100 mL IVPB, 3 g, Intravenous, Q8H, Swayze, Ava, DO, Stopped at 12/21/19 1052 .  apixaban (ELIQUIS) tablet 2.5 mg, 2.5 mg, Oral, BID, Cox, Amy N, DO, 2.5 mg at 12/20/19 2132 .  arformoterol (BROVANA) nebulizer solution 15 mcg, 15 mcg, Nebulization, BID, Raiford Noble [place], [place], 15 mcg at 12/21/19 9622 .  aspirin EC tablet 81 mg, 81 mg, Oral, Daily, Cox, Amy N, DO, 81 mg at 12/20/19 0855 .  benzonatate (TESSALON) capsule 200 mg, 200 mg, Oral, TID PRN, Vashti Hey, MD, 200 mg at 12/20/19 2131 .  budesonide (PULMICORT) nebulizer solution 0.25 mg, 0.25 mg, Nebulization, BID, Raiford Noble [place]if, DO, 0.25 mg at 12/21/19 2979 .  calcium-vitamin D (OSCAL WITH D) 500-200 MG-UNIT per tablet 1 tablet, 1 tablet, Oral, Daily, Cox, Amy N, DO, 1 tablet at 12/20/19 0854 .  Chlorhexidine Gluconate Cloth 2 % PADS 6 each, 6 each, Topical, Q0600, Raiford Noble [place], [place], 6 each at 12/21/19 0531 .  escitalopram (LEXAPRO) tablet 10 mg, 10 mg, Oral, Daily, Cox, Amy N, DO, 10 mg at 12/20/19 0855 .  furosemide (LASIX) tablet 40 mg, 40 mg, Oral, Daily, Cox, Amy N, DO, 40 mg  at 12/20/19 0855 .  guaiFENesin (MUCINEX) 12 hr tablet 1,200 mg, 1,200 mg, Oral, BID, Raiford Noble [place], DO, 1,200 mg at 12/20/19 2132 .  ipratropium-albuterol (DUONEB) 0.5-2.5 (3) MG/3ML nebulizer solution 3 mL, 3 mL, Nebulization, TID, Cox, Amy N, DO, 3 mL at 12/21/19 1348 .  LORazepam (ATIVAN) tablet 0.5 mg, 0.5 mg, Oral, Q4H PRN, Cox, Amy N, DO, 0.5 mg at 12/20/19 2132 .  montelukast (SINGULAIR) tablet 10 mg, 10 mg, Oral, QHS, Cox, Amy N, DO, 10 mg at 12/20/19 2132 .  multivitamin with minerals tablet, , Oral, Daily, Cox, Amy N, DO, 1 tablet at 12/20/19 0855 .  mupirocin ointment (BACTROBAN) 2 % 1 application, 1 application, Nasal, BID, Raiford Noble [place], [place], 1 application at 89/21/19 2133 .  oxyCODONE (Oxy IR/ROXICODONE) immediate release tablet 2.5 mg, 2.5 mg, Oral, Q8H PRN, Bonnell Public Tublu, MD, 2.5 mg at 12/20/19 1623 .  pramipexole (MIRAPEX) tablet 0.25 mg, 0.25 mg, Oral, Daily, Cox,  Amy N, DO, 0.25 mg at 12/20/19 2133 .  predniSONE (DELTASONE) tablet 40 mg, 40 mg, Oral, Q breakfast, Swayze, Ava, DO, 40 mg at 12/20/19 0856 .  simvastatin (ZOCOR) tablet 20 mg, 20 mg, Oral, QHS, Cox, Amy N, DO, 20 mg at 12/20/19 2131 .  spironolactone (ALDACTONE) tablet 25 mg, 25 mg, Oral, Daily, Cox, Amy N, DO, 25 mg at 12/20/19 0938   Family History  Problem Relation Age of Onset  . Heart disease Mother   . Heart disease Father   . Cancer Sister        lung  . Breast cancer Daughter      Social History   Tobacco Use  . Smoking status: Former Smoker    Packs/day: 1.00    Years: 25.00    Pack years: 25.00    Types: Cigarettes    Quit date: 1982    Years since quitting: 39.9  . Smokeless tobacco: Never Used  Vaping Use  . Vaping Use: Never used  Substance Use Topics  . Alcohol use: No  . Drug use: No    Allergies as of 12/30/2019 - Review Complete 12/31/2019  Allergen Reaction Noted  . Codeine Diarrhea 04/19/2013  . Hydrocodone Nausea And Vomiting 04/19/2013  .  Levofloxacin Other (See Comments) 04/14/2015  . Tramadol Nausea And Vomiting 10/23/2014    Review of Systems:    All systems reviewed and negative except where noted in HPI.   Physical Exam:  Vital signs in last 24 hours: Temp:  [97.6 F (36.4 C)-98.1 F (36.7 C)] 97.6 F (36.4 C) (12/10 0738) Pulse Rate:  [73-93] 73 (12/10 0738) Resp:  [16-20] 16 (12/10 0738) BP: (132-146)/(51-56) 132/51 (12/10 0738) SpO2:  [74 %-95 %] 95 % (12/10 1348) Weight:  [59.5 kg] 59.5 kg (12/10 0738) Last BM Date: 12/16/19 General:   Pleasant, cooperative in NAD Head:  Normocephalic and atraumatic. Eyes:   No icterus.   Conjunctiva pink. PERRLA. Ears: Decreased auditory acuity. Neck:  Supple; no masses or thyroidomegaly Lungs: Respirations even and unlabored. Lungs clear to auscultation bilaterally.   No wheezes, crackles, or rhonchi.  Heart:  Regular rate and rhythm;  Without murmur, clicks, rubs or gallops Abdomen:  Soft, nondistended, nontender. Normal bowel sounds. No appreciable masses or hepatomegaly.  No rebound or guarding.  Rectal:  Not performed. Msk: Left BKA Extremities:  Without edema, cyanosis or clubbing. Neurologic:  Alert and oriented x2;  grossly normal neurologically. Skin:  Intact without significant lesions or rashes. Psych:  Alert and cooperative. Normal affect.  LAB RESULTS: CBC Latest Ref Rng & Units 12/20/2019 12/19/2019 12/18/2019  WBC 4.0 - 10.5 K/uL 17.4(H) 9.9 11.5(H)  Hemoglobin 12.0 - 15.0 g/dL 9.7(L) 9.4(L) 11.1(L)  Hematocrit 36.0 - 46.0 % 30.2(L) 30.3(L) 35.0(L)  Platelets 150 - 400 K/uL 310 290 320    BMET BMP Latest Ref Rng & Units 12/19/2019 12/18/2019 12/17/2019  Glucose 70 - 99 mg/dL 156(H) 183(H) 114(H)  BUN 8 - 23 mg/dL 38(H) 28(H) 21  Creatinine 0.44 - 1.00 mg/dL 0.83 0.76 0.74  BUN/Creat Ratio 11 - 26 - - -  Sodium 135 - 145 mmol/L 137 133(L) 136  Potassium 3.5 - 5.1 mmol/L 4.2 4.4 4.7  Chloride 98 - 111 mmol/L 95(L) 91(L) 94(L)  CO2 22 - 32 mmol/L  33(H) 31 34(H)  Calcium 8.9 - 10.3 mg/dL 9.6 9.3 9.6    LFT Hepatic Function Latest Ref Rng & Units 12/19/2019 12/18/2019 12/15/2019  Total Protein 6.5 - 8.1 g/dL 5.9(L)  6.3(L) 6.5  Albumin 3.5 - 5.0 g/dL 2.9(L) 3.1(L) 2.9(L)  AST 15 - 41 U/L '18 19 25  ' ALT 0 - 44 U/L '17 18 18  ' Alk Phosphatase 38 - 126 U/L 56 72 75  Total Bilirubin 0.3 - 1.2 mg/dL 0.5 0.3 0.6  Bilirubin, Direct 0.00 - 0.40 mg/dL - - -     STUDIES: DG Neck Soft Tissue  Result Date: 12/20/2019 CLINICAL DATA:  Difficulty swallowing over the last 2 days. Hoarseness. Assess for foreign object. EXAM: NECK SOFT TISSUES - 1+ VIEW COMPARISON:  None. FINDINGS: No foreign object is visible. Patient does appear to have some prevertebral/retropharyngeal soft tissue swelling that could go along with inflammatory change. Consider CT of the neck if concern persists. IMPRESSION: 1. No foreign object visible. 2. Prevertebral/retropharyngeal soft tissue swelling that could go along with inflammatory change. Consider CT if concern persists. Electronically Signed   By: Nelson Chimes M.D.   On: 12/20/2019 11:31   CT SOFT TISSUE NECK W CONTRAST  Result Date: 12/20/2019 CLINICAL DATA:  86 year old female with cellulitis. Prevertebral soft tissue swelling on neck radiographs today. EXAM: CT NECK WITH CONTRAST TECHNIQUE: Multidetector CT imaging of the neck was performed using the standard protocol following the bolus administration of intravenous contrast. CONTRAST:  72m OMNIPAQUE IOHEXOL 300 MG/ML  SOLN COMPARISON:  Neck radiographs earlier today.  Chest CTA 12/24/2019. FINDINGS: Pharynx and larynx: Retropharyngeal soft tissue thickening appears to have been related to a retropharyngeal course of both carotid arteries (series 2, image 37, normal variant). No retropharyngeal or parapharyngeal space fluid or inflammation. Larynx and pharynx soft tissue contours are within normal limits. Salivary glands: Negative sublingual space. Submandibular glands and  parotid glands are within normal limits. Thyroid: Negative. Lymph nodes: Negative.  No cervical lymphadenopathy. Vascular: Major vascular structures in the neck and at the skull base are patent. Retropharyngeal course of both cervical carotids. Calcified atherosclerosis in the neck and at the skull base. Dominant appearing left vertebral artery. Limited intracranial: Negative. Visualized orbits: Postoperative changes to both globes, otherwise negative. Mastoids and visualized paranasal sinuses: Fairly low-density fluid levels in both maxillary sinuses. Minor bubbly opacity in some posterior ethmoid air cells. Other visible paranasal sinuses are clear. Previous bilateral mastoidectomy and bilateral cochlear implants. Mastoid and tympanic cavities appear clear. Skeleton: Osteopenia. Absent maxillary dentition. Degenerative changes in the cervical spine. No acute osseous abnormality identified. Upper chest: Dilated and fluid-filled thoracic esophagus (series 2, image 73 and series 6, image 61. The esophagus had a similar caliber on 12/31/2019 but was air-filled at that time. Underlying hiatal hernia noted on that exam. No superior mediastinal lymphadenopathy. Calcified aortic atherosclerosis. Lung apices are stable aside from mild atelectasis. IMPRESSION: 1. New dilated and fluid-filled visible Thoracic Esophagus. Underlying hiatal hernia noted on recent chest CTA. Top differential considerations include gastroesophageal reflux, gastric outlet obstruction. 2. No acute or inflammatory process identified in the neck. Retropharyngeal soft tissue contour explained by retropharyngeal course of both cervical carotids (normal variant). 3. Mild acute bilateral maxillary sinusitis. 4. Aortic Atherosclerosis (ICD10-I70.0). Electronically Signed   By: HGenevie AnnM.D.   On: 12/20/2019 15:58      Impression / Plan:   Natasha WAITESis a 86y.o. female with history of COPD on 3 L oxygen, history of blood clots, on chronic  anticoagulation, Eliquis, left BKA who is admitted with COPD exacerbation and community-acquired pneumonia.  GI is consulted as patient complained of acute episode of dysphagia.  There is no evidence of any  soft tissue lesions in the neck, also evaluated by ENT  Dysphagia:  X-ray revealed fluid-filled thoracic esophagus and CT chest revealed large hiatal hernia Given patient's last dose of Eliquis was on 12/9 PM, recommend x-ray esophagus today to evaluate for any obvious stricture or space-occupying lesion With history of large hiatal hernia, patient is at risk for erosive esophagitis and possible esophageal stricture, therefore, recommend to start Protonix 40 mg IV twice daily.  If there is no evidence of obvious obstruction on barium esophagogram, recommend to start full liquid diet/mechanical soft diet.  We can consider upper endoscopy based on her clinical progress after interruption of anticoagulation at least for 48 hours  Thank you for involving me in the care of this patient.  Dr. Vicente Males will cover for the weekend   LOS: 7 days   Sherri Sear, MD  12/21/2019, 4:45 PM   Note: This dictation was prepared with Dragon dictation along with smaller phrase technology. Any transcriptional errors that result from this process are unintentional.

## 2019-12-22 ENCOUNTER — Inpatient Hospital Stay: Payer: Medicare Other

## 2019-12-22 DIAGNOSIS — J9621 Acute and chronic respiratory failure with hypoxia: Secondary | ICD-10-CM

## 2019-12-22 DIAGNOSIS — J690 Pneumonitis due to inhalation of food and vomit: Secondary | ICD-10-CM

## 2019-12-22 DIAGNOSIS — I5032 Chronic diastolic (congestive) heart failure: Secondary | ICD-10-CM

## 2019-12-22 LAB — MAGNESIUM: Magnesium: 2.2 mg/dL (ref 1.7–2.4)

## 2019-12-22 LAB — PROCALCITONIN: Procalcitonin: 0.35 ng/mL

## 2019-12-22 LAB — CBC WITH DIFFERENTIAL/PLATELET
Abs Immature Granulocytes: 0.16 10*3/uL — ABNORMAL HIGH (ref 0.00–0.07)
Basophils Absolute: 0 10*3/uL (ref 0.0–0.1)
Basophils Relative: 0 %
Eosinophils Absolute: 0.1 10*3/uL (ref 0.0–0.5)
Eosinophils Relative: 1 %
HCT: 29 % — ABNORMAL LOW (ref 36.0–46.0)
Hemoglobin: 8.8 g/dL — ABNORMAL LOW (ref 12.0–15.0)
Immature Granulocytes: 1 %
Lymphocytes Relative: 8 %
Lymphs Abs: 1.2 10*3/uL (ref 0.7–4.0)
MCH: 27.2 pg (ref 26.0–34.0)
MCHC: 30.3 g/dL (ref 30.0–36.0)
MCV: 89.8 fL (ref 80.0–100.0)
Monocytes Absolute: 0.8 10*3/uL (ref 0.1–1.0)
Monocytes Relative: 5 %
Neutro Abs: 12.6 10*3/uL — ABNORMAL HIGH (ref 1.7–7.7)
Neutrophils Relative %: 85 %
Platelets: 268 10*3/uL (ref 150–400)
RBC: 3.23 MIL/uL — ABNORMAL LOW (ref 3.87–5.11)
RDW: 18 % — ABNORMAL HIGH (ref 11.5–15.5)
WBC: 14.9 10*3/uL — ABNORMAL HIGH (ref 4.0–10.5)
nRBC: 0.1 % (ref 0.0–0.2)

## 2019-12-22 LAB — BLOOD GAS, ARTERIAL
Acid-Base Excess: 8.3 mmol/L — ABNORMAL HIGH (ref 0.0–2.0)
Allens test (pass/fail): POSITIVE — AB
Bicarbonate: 33.4 mmol/L — ABNORMAL HIGH (ref 20.0–28.0)
FIO2: 70
MECHVT: 380 mL
O2 Saturation: 93.4 %
PEEP: 5 cmH2O
Patient temperature: 37
RATE: 16 resp/min
pCO2 arterial: 48 mmHg (ref 32.0–48.0)
pH, Arterial: 7.45 (ref 7.350–7.450)
pO2, Arterial: 65 mmHg — ABNORMAL LOW (ref 83.0–108.0)

## 2019-12-22 LAB — GLUCOSE, CAPILLARY
Glucose-Capillary: 152 mg/dL — ABNORMAL HIGH (ref 70–99)
Glucose-Capillary: 160 mg/dL — ABNORMAL HIGH (ref 70–99)
Glucose-Capillary: 170 mg/dL — ABNORMAL HIGH (ref 70–99)
Glucose-Capillary: 173 mg/dL — ABNORMAL HIGH (ref 70–99)
Glucose-Capillary: 175 mg/dL — ABNORMAL HIGH (ref 70–99)

## 2019-12-22 LAB — BASIC METABOLIC PANEL
Anion gap: 11 (ref 5–15)
BUN: 31 mg/dL — ABNORMAL HIGH (ref 8–23)
CO2: 31 mmol/L (ref 22–32)
Calcium: 9.2 mg/dL (ref 8.9–10.3)
Chloride: 96 mmol/L — ABNORMAL LOW (ref 98–111)
Creatinine, Ser: 0.79 mg/dL (ref 0.44–1.00)
GFR, Estimated: >60 mL/min (ref >60)
Glucose, Bld: 165 mg/dL — ABNORMAL HIGH (ref 70–99)
Potassium: 3.6 mmol/L (ref 3.5–5.1)
Sodium: 138 mmol/L (ref 135–145)

## 2019-12-22 LAB — PHOSPHORUS: Phosphorus: 3.5 mg/dL (ref 2.5–4.6)

## 2019-12-22 MED ORDER — FENTANYL CITRATE (PF) 100 MCG/2ML IJ SOLN
25.0000 ug | INTRAMUSCULAR | Status: AC | PRN
  Administered 2019-12-23 – 2019-12-26 (×3): 25 ug via INTRAVENOUS
  Filled 2019-12-22 (×4): qty 2

## 2019-12-22 MED ORDER — POLYETHYLENE GLYCOL 3350 17 G PO PACK
17.0000 g | PACK | Freq: Every day | ORAL | Status: DC
  Filled 2019-12-22: qty 1

## 2019-12-22 MED ORDER — LACTATED RINGERS IV SOLN: INTRAVENOUS | Status: DC

## 2019-12-22 MED ORDER — FENTANYL CITRATE (PF) 100 MCG/2ML IJ SOLN
25.0000 ug | INTRAMUSCULAR | Status: DC | PRN
  Administered 2019-12-22 – 2019-12-23 (×3): 100 ug via INTRAVENOUS
  Administered 2019-12-24 – 2019-12-25 (×2): 25 ug via INTRAVENOUS
  Filled 2019-12-22 (×4): qty 2

## 2019-12-22 MED ORDER — SODIUM CHLORIDE 0.9 % IV SOLN
250.0000 mL | INTRAVENOUS | Status: DC
  Administered 2019-12-23 – 2019-12-25 (×3): 250 mL via INTRAVENOUS

## 2019-12-22 MED ORDER — NOREPINEPHRINE 4 MG/250ML-% IV SOLN
0.0000 ug/min | INTRAVENOUS | Status: DC
  Administered 2019-12-22: 08:00:00 2 ug/min via INTRAVENOUS
  Administered 2019-12-22: 16:00:00 10 ug/min via INTRAVENOUS
  Administered 2019-12-23: 23:00:00 3 ug/min via INTRAVENOUS
  Administered 2019-12-23: 07:00:00 24 ug/min via INTRAVENOUS
  Administered 2019-12-23: 03:00:00 20 ug/min via INTRAVENOUS
  Administered 2019-12-23: 10:00:00 18 ug/min via INTRAVENOUS
  Administered 2019-12-24: 18:00:00 2 ug/min via INTRAVENOUS
  Filled 2019-12-22 (×8): qty 250

## 2019-12-22 MED ORDER — VANCOMYCIN HCL 1500 MG/300ML IV SOLN
1500.0000 mg | Freq: Once | INTRAVENOUS | Status: AC
  Administered 2019-12-22: 20:00:00 1500 mg via INTRAVENOUS
  Filled 2019-12-22: qty 300

## 2019-12-22 MED ORDER — DOCUSATE SODIUM 50 MG/5ML PO LIQD
100.0000 mg | Freq: Two times a day (BID) | ORAL | Status: DC
  Filled 2019-12-22: qty 10

## 2019-12-22 MED ORDER — FENTANYL CITRATE (PF) 100 MCG/2ML IJ SOLN
100.0000 ug | Freq: Once | INTRAMUSCULAR | Status: AC
  Administered 2019-12-22: 06:00:00 100 ug via INTRAVENOUS
  Filled 2019-12-22: qty 2

## 2019-12-22 MED ORDER — ORAL CARE MOUTH RINSE
15.0000 mL | OROMUCOSAL | Status: DC
  Administered 2019-12-22 – 2019-12-26 (×39): 15 mL via OROMUCOSAL

## 2019-12-22 MED ORDER — SODIUM CHLORIDE 0.9 % IV SOLN
1.0000 g | Freq: Two times a day (BID) | INTRAVENOUS | Status: DC
  Administered 2019-12-22 – 2019-12-25 (×6): 1 g via INTRAVENOUS
  Filled 2019-12-22 (×7): qty 1

## 2019-12-22 MED ORDER — NOREPINEPHRINE 4 MG/250ML-% IV SOLN
INTRAVENOUS | Status: AC
  Administered 2019-12-22: 06:00:00 10 ug/min via INTRAVENOUS
  Filled 2019-12-22: qty 250

## 2019-12-22 MED ORDER — PROPOFOL 1000 MG/100ML IV EMUL
0.0000 ug/kg/min | INTRAVENOUS | Status: DC
  Administered 2019-12-22: 08:00:00 5 ug/kg/min via INTRAVENOUS
  Administered 2019-12-22 – 2019-12-23 (×3): 40 ug/kg/min via INTRAVENOUS
  Administered 2019-12-23: 04:00:00 50 ug/kg/min via INTRAVENOUS
  Administered 2019-12-23: 17:00:00 30 ug/kg/min via INTRAVENOUS
  Administered 2019-12-23 – 2019-12-25 (×6): 40 ug/kg/min via INTRAVENOUS
  Filled 2019-12-22 (×12): qty 100

## 2019-12-22 MED ORDER — ETOMIDATE 2 MG/ML IV SOLN
20.0000 mg | Freq: Once | INTRAVENOUS | Status: AC
  Administered 2019-12-22: 06:00:00 20 mg via INTRAVENOUS

## 2019-12-22 MED ORDER — CHLORHEXIDINE GLUCONATE 0.12% ORAL RINSE (MEDLINE KIT)
15.0000 mL | Freq: Two times a day (BID) | OROMUCOSAL | Status: DC
  Administered 2019-12-22 – 2019-12-25 (×8): 15 mL via OROMUCOSAL

## 2019-12-22 MED ORDER — MIDAZOLAM HCL 2 MG/2ML IJ SOLN
4.0000 mg | Freq: Once | INTRAMUSCULAR | Status: AC
  Administered 2019-12-22: 06:00:00 4 mg via INTRAVENOUS
  Filled 2019-12-22: qty 4

## 2019-12-22 MED ORDER — VECURONIUM BROMIDE 10 MG IV SOLR
10.0000 mg | Freq: Once | INTRAVENOUS | Status: AC
  Administered 2019-12-22: 06:00:00 10 mg via INTRAVENOUS

## 2019-12-22 MED ORDER — VANCOMYCIN HCL IN DEXTROSE 1-5 GM/200ML-% IV SOLN
1000.0000 mg | INTRAVENOUS | Status: DC
  Administered 2019-12-24: 10:00:00 1000 mg via INTRAVENOUS
  Filled 2019-12-22 (×2): qty 200

## 2019-12-22 NOTE — Consult Note (Signed)
Pharmacy Antibiotic Note  Natasha [name] is a 86 y.o. female admitted on 12/17/2019 with pneumonia.  Pharmacy has been consulted for Vancomycin and Meropenem dosing.  MRSA PCR (+)  Plan: Vancomycin 1500 mg x1 loading dose, followed by 1000 mg Q36H (Expected Cssmin 11.5 mcg/mL). Will check Scr with AM  Labs.   Meropenem 1g Q12H   Height: 4\' 11"  (149.9 cm) Weight: 59.5 kg (131 lb 2.8 oz) IBW/kg (Calculated) : 43.2  Temp (24hrs), Avg:98.8 F (37.1 C), Min:98 F (36.7 C), Max:100.3 F (37.9 C)  Recent Labs  Lab 12/16/19 0501 12/17/19 0429 12/18/19 0832 12/19/19 0525 12/20/19 0420 12/22/19 0500  WBC 10.9* 12.3* 11.5* 9.9 17.4* 14.9*  CREATININE 0.82 0.74 0.76 0.83  --  0.79    Estimated Creatinine Clearance: 37.4 mL/min (by C-G formula based on SCr of 0.79 mg/dL).    Allergies  Allergen Reactions  . Codeine Diarrhea    Patient can tolerate small amounts of OXYCODONE per patient info sheet  . Hydrocodone Nausea And Vomiting    Patient can tolerate small amounts of OXYCODONE per patient info sheet  . Levofloxacin Other (See Comments)    Muscle cramps  . Tramadol Nausea And Vomiting   Thank you for allowing pharmacy to be a part of this patient's care.  Rowland Lathe 12/22/2019 6:50 PM

## 2019-12-22 NOTE — Progress Notes (Signed)
Shift summary:  - Patient is intubated and sedated, on pressors.

## 2019-12-22 NOTE — Procedures (Signed)
Intubation Procedure Note  Natasha [name]  [id number]  1928/07/08  Date:12/22/19  Time:6:26 AM   Provider Performing:Jaedon Siler L [place]  With Dr. Bretta Bang bedside assisting.  Procedure: Intubation (31500)  Indication(s) Respiratory Failure  Consent Unable to obtain consent due to emergent nature of procedure. Patient nodded head yes when procedure discussed, unable to sign consent. Family alerted without objections to procedure.   Anesthesia Etomidate, Versed and Fentanyl and vecuronium   Time Out Verified patient identification, verified procedure, site/side was marked, verified correct patient position, special equipment/implants available, medications/allergies/relevant history reviewed, required imaging and test results available.   Sterile Technique Usual hand hygeine, masks, and gloves were used   Procedure Description Patient positioned in bed supine.  Sedation given as noted above.  Patient was intubated with a 7.5 endotracheal tube using Glidescope.  View was Grade 1 full glottis , patient's airway was anterior- cricoid pressure required.  Number of attempts was 1.  Colorimetric CO2 detector was consistent with tracheal placement at 24 at the lip.   Complications/Tolerance None; patient tolerated the procedure well. Chest X-ray is ordered to verify placement.   EBL none   Specimen(s) None   Venetia Night, AGACNP-BC Acute Care Nurse Practitioner [facility]   [phone] / [phone] Please see Amion for pager details.

## 2019-12-22 NOTE — Consult Note (Signed)
NAME:  Natasha [name], MRN:  [id number], DOB:  12/19/1928, LOS: 8 ADMISSION DATE:  12/22/2019, CONSULTATION DATE:  12/22/19 REFERRING MD:  Rufina Falco, NP, CHIEF COMPLAINT: Shortness of breath  Brief History   86 year old female with acute on chronic hypoxic respiratory failure and suspected aspiration pneumonia in the setting of suspected achalasia requiring emergent intubation and mechanical ventilation admitted to the ICU.  History of present illness   86 year old female sent to the ED from pulmonology office by Dr. Mortimer Fries due to worsening respiratory status.  Patient has had worsening shortness of breath and been coughing for the past 2 to 3 days with difficulty clearing sputum.  Patient chronically on 3 L nasal cannula, in the ED was requiring 6 L nasal cannula.  Concerns for COPD exacerbation with community-acquired pneumonia and was started on antibiotics.  Patient began complaining that "something was caught in the back of her throat" on 12/21/19.  ENT was consulted with concerns for retropharyngeal swelling.  ENT performed transnasal flexible laryngoscopy, finding no retropharyngeal swelling but suspected sinusitis and fluid-filled thoracic esophagus.  Overnight patient became increasingly short of breath, stating "I'm choking. Help me I cannot breathe."  Oxygen requirements were increased overnight with patient's SPO2 dipping into the low 80s.  Patient placed on 15 L nonrebreather, which was just maintaining sats above 88%.  Upon bedside assessment patient tripoding requiring more accessory muscle use and continuing to ask for help stating she cannot breathe.  PCCM consulted and patient transferred to ICU for emergent intubation mechanical ventilation. Past Medical History  HFpEF Anemia CAD Diverticulosis GERD Hypertension Hyperlipidemia Osteoarthritis PVD with claudication s/p AKA Type 2 diabetes Trigeminal neuralgia Former tobacco user [place] [facility]   12/13/2019 admit to hospital 12/22/19 admit to ICU in respiratory distress, emergently intubated  Consults:  Gastroenterology ENT Palliative care PCCM  Procedures:  12/21/19 transnasal flexible laryngoscopy 12/22/19 ETT >> 12/22/19 CVC 3L L IJ >>  Significant Diagnostic Tests:  12/22/2019 CTa >> negative for pulmonary emboli, left base opacity-atelectasis versus pneumonia.  Large hiatal hernia with atelectasis adjacent to the hernia in the right base, CAD, emphysematous changes in the lungs 12/19/19 echocardiogram >> LVEF 60-65%, moderate LVH, all else WNL 12/20/19 CT neck >> new dilated and fluid-filled thoracic esophagus, GERD versus gastric outlet obstruction, mild acute bilateral maxillary sinusitis  Micro Data:  12/22/2019 COVID-19 >> negative 01/11/2020 influenza A/B >> negative 01/05/2020 MRSA PCR >> positive 12/17/2019 blood cultures x2 >> negative 12/20/19 COVID-19 >> negative  Antimicrobials:  01/03/2020 azithromycin >> 12/18/19 12/18/2019 ceftriaxone >> 12/18/19 12/20/19 Unasyn >>  Interim history/subjective:  Patient in bed sitting forward in tripod position, labored breathing with accessory muscle use.  When asked the patient stated, " I feel like I am choking.  Please help me I can't breathe." I explained that we would be transferring the patient to the ICU to intubate and place her on a ventilator machine.  The patient nodded and said, "yes, good."  Labs/ Imaging personally reviewed Net: +565 mL (- 3.6 L since admit) Na+/ K+: 137/ 4.2 BUN/Cr.: 38/ 0.83 Hgb: 9.4  WBC/ TMAX: 9.9/ 37.6  Objective   Blood pressure (!) 49/28, pulse 77, temperature 98.2 F (36.8 C), temperature source Axillary, resp. rate 16, height 4\' 11"  (1.499 m), weight 59.5 kg, SpO2 97 %.    Vent Mode: PRVC FiO2 (%):  [70 %] 70 % Set Rate:  [16 bmp] 16 bmp Vt Set:  [380 mL] 380 mL PEEP:  [5 cmH20]  5 cmH20   Intake/Output Summary (Last 24 hours) at 12/22/2019 0631 Last data filed at 12/21/2019  1403 Gross per 24 hour  Intake 300 ml  Output --  Net 300 ml   Filed Weights   12/21/19 0738  Weight: 59.5 kg    Examination: General: Adult female, critically ill, lying in bed, dsypnea with accessory muscle use HEENT: MM pink/moist, anicteric, atraumatic, neck supple Neuro: A&O x 2-3, follows commands, PERRL +3, MAE CV: s1s2 RRR, NSR on monitor, no r/m/g Pulm: Regular, dsypneic & labored on NRB 15 L, breath sounds coarse & diminished throughout GI: soft, rounded, hernia- non tender, bs x 4 GU: deferred Skin: limited exam- scattered ecchymosis, no rashes/lesions noted Extremities: warm/dry, pulses +2- L AKA no edema noted  Resolved Hospital Problem list     Assessment & Plan:  Acute Hypoxic / Hypercapnic Respiratory Failure  Multifactorial in the setting of COPD exacerbation/treated CAP/suspected aspiration pneumonia - Ventilator settings: PRVC  8 mL/kg, 70% FiO2, 5 PEEP, continue ventilator support & lung protective strategies - Wean PEEP & FiO2 as tolerated, maintain SpO2 > 90% - Head of bed elevated 30 degrees, VAP protocol in place - Plateau pressures less than 30 cm H20  - Intermittent chest x-ray & ABG PRN - Daily WUA with SBT as tolerated  - Ensure adequate pulmonary hygiene  - f/u cultures, trend PCT - PAD protocol in place: continue Fentanyl IVP & Propofol drip -Continue Levophed as needed to maintain a MAP > 65 -Continue Unasyn for suspected aspiration pneumonia and sinusitis -Continue Brovana twice daily, albuterol as needed -Continue steroids  Suspected Achalasia No evidence of obvious obstruction on barium esophogram -Possible upper endoscopy, Eliquis held- 12/10 -Continue Protonix twice daily -GI consulted appreciate input  HFpEF -Continue Lasix -Continue spironolactone - Strict I/O's: alert provider if UOP < 0.5 mL/kg/hr - Daily BMP, replace electrolytes PRN  Hyperlipidemia -Continue simvastatin  Best practice (evaluated daily)  Diet:  N.p.o. Pain/Anxiety/Delirium protocol (if indicated): Fentanyl IVP & propofol drip VAP protocol (if indicated): Initiated DVT prophylaxis: SCD's-Eliquis on hold for potential upper endoscopy GI prophylaxis: Protonix twice daily Glucose control: Monitor Q4, target range 140-180 Mobility: Bedrest, mobilize as tolerated last date of multidisciplinary goals of care discussion: 12/22/19 Family and staff present: Discussed the need for emergent intubation and line placement with daughter over the phone. Summary of discussion: Patient emergently intubated for airway protection Follow up goals of care discussion due: 12/24/19 Code Status: Full Disposition: ICU  Labs   CBC: Recent Labs  Lab 12/16/19 0501 12/17/19 0429 12/18/19 0832 12/19/19 0525 12/20/19 0420  WBC 10.9* 12.3* 11.5* 9.9 17.4*  NEUTROABS 9.6* 9.4* 10.3* 9.2* 15.5*  HGB 10.3* 10.7* 11.1* 9.4* 9.7*  HCT 32.7* 34.0* 35.0* 30.3* 30.2*  MCV 86.3 86.3 86.6 88.1 86.8  PLT 301 300 320 290 782    Basic Metabolic Panel: Recent Labs  Lab 12/16/19 0501 12/17/19 0429 12/18/19 0832 12/19/19 0525  NA 131* 136 133* 137  K 4.3 4.7 4.4 4.2  CL 91* 94* 91* 95*  CO2 30 34* 31 33*  GLUCOSE 145* 114* 183* 156*  BUN 24* 21 28* 38*  CREATININE 0.82 0.74 0.76 0.83  CALCIUM 9.3 9.6 9.3 9.6  MG  --   --  2.4 2.3  PHOS  --   --  2.6 2.6   GFR: Estimated Creatinine Clearance: 36.1 mL/min (by C-G formula based on SCr of 0.83 mg/dL). Recent Labs  Lab 12/17/19 0429 12/18/19 0832 12/19/19 0525 12/20/19 0420  WBC  12.3* 11.5* 9.9 17.4*    Liver Function Tests: Recent Labs  Lab 12/18/19 0832 12/19/19 0525  AST 19 18  ALT 18 17  ALKPHOS 72 56  BILITOT 0.3 0.5  PROT 6.3* 5.9*  ALBUMIN 3.1* 2.9*   No results for input(s): LIPASE, AMYLASE in the last 168 hours. No results for input(s): AMMONIA in the last 168 hours.  ABG    Component Value Date/Time   PHART 7.38 02/28/2015 1323   PCO2ART 55 (H) 02/28/2015 1323   PO2ART  63 (L) 02/28/2015 1323   HCO3 32.5 (H) 02/28/2015 1323   O2SAT 91.3 02/28/2015 1323     Coagulation Profile: No results for input(s): INR, PROTIME in the last 168 hours.  Cardiac Enzymes: No results for input(s): CKTOTAL, CKMB, CKMBINDEX, TROPONINI in the last 168 hours.  HbA1C: Hgb A1c MFr Bld  Date/Time Value Ref Range Status  01/14/2018 01:10 PM 5.8 (H) 4.8 - 5.6 % Final    Comment:    (NOTE) Pre diabetes:          5.7%-6.4% Diabetes:              >6.4% Glycemic control for   <7.0% adults with diabetes     CBG: No results for input(s): GLUCAP in the last 168 hours.  Review of Systems: Positives in BOLD  Gen: Denies fever, chills, weight change, fatigue, night sweats HEENT: Denies blurred vision, double vision, hearing loss, tinnitus, sinus congestion, rhinorrhea, sore throat, neck stiffness, dysphagia PULM: Denies shortness of breath, cough, sputum production, hemoptysis, wheezing CV: Denies chest pain, edema, orthopnea, paroxysmal nocturnal dyspnea, palpitations GI: Denies abdominal pain, nausea, vomiting, diarrhea, hematochezia, melena, constipation, change in bowel habits GU: Denies dysuria, hematuria, polyuria, oliguria, urethral discharge Endocrine: Denies hot or cold intolerance, polyuria, polyphagia or appetite change Derm: Denies rash, dry skin, scaling or peeling skin change Heme: Denies easy bruising, bleeding, bleeding gums Neuro: Denies headache, numbness, weakness, slurred speech, loss of memory or consciousness  Past Medical History  She,  has a past medical history of Anemia, Arthritis, Asthma, Bilateral pneumonia (may 2017), Bronchitis, Cardiomegaly, Chronic respiratory failure with hypoxia ([place]), Cochlear implant in place, Gallstones (2015), GERD (gastroesophageal reflux disease), H/O blood clots (2014), History of home oxygen therapy, Hypertension, Hypokalemia, Peripheral vascular disease ([place]), Polyneuropathy, Renal disorder, and Restless leg syndrome.    Surgical History    Past Surgical History:  Procedure Laterality Date  . ABDOMINAL HYSTERECTOMY  1960  . APPENDECTOMY  1946  . BREAST BIOPSY Bilateral    negative  . CHOLECYSTECTOMY  04-24-13  . COCHLEAR IMPLANT  2009  . COLONOSCOPY  2015   Dr. Rayann Heman   . EYE SURGERY  2008,2009   cataract  . KYPHOPLASTY N/A 08/19/2015   Procedure: KYPHOPLASTY;  Surgeon: Hessie Knows, MD;  Location: [facility] ORS;  Service: Orthopedics;  Laterality: N/A;  . PERIPHERAL VASCULAR CATHETERIZATION N/A 02/27/2015   Procedure: Abdominal Aortogram w/Lower Extremity;  Surgeon: Algernon Huxley, MD;  Location: [place] [facility];  Service: Cardiovascular;  Laterality: N/A;  . PERIPHERAL VASCULAR CATHETERIZATION  02/27/2015   Procedure: Lower Extremity Intervention;  Surgeon: Algernon Huxley, MD;  Location: [place] [facility];  Service: Cardiovascular;;  . PERIPHERAL VASCULAR CATHETERIZATION Left 02/28/2015   Procedure: Lower Extremity Angiography;  Surgeon: Algernon Huxley, MD;  Location: [place] [facility];  Service: Cardiovascular;  Laterality: Left;  . PERIPHERAL VASCULAR CATHETERIZATION  02/28/2015   Procedure: Lower Extremity Intervention;  Surgeon: Algernon Huxley, MD;  Location: [facility]  INVASIVE CV LAB;  Service: Cardiovascular;;  . PERIPHERAL VASCULAR CATHETERIZATION Left 05/22/2015   Procedure: Lower Extremity Angiography;  Surgeon: Algernon Huxley, MD;  Location: [place] [facility];  Service: Cardiovascular;  Laterality: Left;  . PERIPHERAL VASCULAR CATHETERIZATION  05/22/2015   Procedure: Lower Extremity Intervention;  Surgeon: Algernon Huxley, MD;  Location: [place] [facility] LAB;  Service: Cardiovascular;;  . PERIPHERAL VASCULAR CATHETERIZATION Left 05/23/2015   Procedure: Lower Extremity Angiography;  Surgeon: Algernon Huxley, MD;  Location: [place] [facility];  Service: Cardiovascular;  Laterality: Left;  . PERIPHERAL VASCULAR CATHETERIZATION  05/23/2015   Procedure: Lower Extremity Intervention;  Surgeon: Algernon Huxley, MD;  Location:  [place] [facility];  Service: Cardiovascular;;  . stent placement  2006-2014   multiple stent placements in legs     Social History   reports that she quit smoking about 39 years ago. Her smoking use included cigarettes. She has a 25.00 pack-year smoking history. She has never used smokeless tobacco. She reports that she does not drink alcohol and does not use drugs.   Family History   Her family history includes Breast cancer in her daughter; Cancer in her sister; Heart disease in her father and mother.   Allergies Allergies  Allergen Reactions  . Codeine Diarrhea    Patient can tolerate small amounts of OXYCODONE per patient info sheet  . Hydrocodone Nausea And Vomiting    Patient can tolerate small amounts of OXYCODONE per patient info sheet  . Levofloxacin Other (See Comments)    Muscle cramps  . Tramadol Nausea And Vomiting     Home Medications  Prior to Admission medications   Medication Sig Start Date End Date Taking? Authorizing Provider  albuterol (PROVENTIL) (2.5 MG/3ML) 0.083% nebulizer solution Take 2.5 mg by nebulization every 6 (six) hours as needed for wheezing or shortness of breath.   Yes [provider]  albuterol (VENTOLIN HFA) 108 (90 Base) MCG/ACT inhaler Inhale into the lungs every 6 (six) hours as needed for wheezing or shortness of breath.   Yes [provider]  amoxicillin-clavulanate (AUGMENTIN) 875-125 MG tablet Take 1 tablet by mouth 2 (two) times daily.   Yes [provider]  apixaban (ELIQUIS) 2.5 MG TABS tablet Take by mouth 2 (two) times daily.   Yes [provider]  aspirin EC 81 MG tablet Take 81 mg by mouth daily. Swallow whole.   Yes [provider]  escitalopram (LEXAPRO) 10 MG tablet Take 10 mg by mouth daily.   Yes [provider]  furosemide (LASIX) 40 MG tablet Take 40 mg by mouth daily.   Yes [provider]  LORazepam (ATIVAN) 0.5 MG tablet Take 0.5 mg by mouth every 4 (four)  hours as needed for anxiety.   Yes [provider]  magnesium oxide (MAG-OX) 400 MG tablet Take 400 mg by mouth daily.   Yes [provider]  metolazone (ZAROXOLYN) 2.5 MG tablet Take 2.5 mg by mouth. M,W,F   Yes [provider]  montelukast (SINGULAIR) 10 MG tablet Take 10 mg by mouth at bedtime.   Yes [provider]  Multiple Vitamins-Minerals (DECUBI-VITE PO) Take 1 capsule by mouth daily.   Yes [provider]  pramipexole (MIRAPEX) 0.25 MG tablet Take 0.25 mg by mouth daily.   Yes [provider]  PULMICORT FLEXHALER 180 MCG/ACT inhaler Inhale 2 puffs into the lungs 2 (two) times daily. 11/30/19  Yes [provider]  simvastatin (ZOCOR) 20 MG tablet  Take 20 mg by mouth daily.   Yes [provider]  sodium chloride (MURO 128) 2 % ophthalmic solution 1 drop.   Yes [provider]  spironolactone (ALDACTONE) 25 MG tablet Take 25 mg by mouth daily.   Yes [provider]  arformoterol (BROVANA) 15 MCG/2ML NEBU Take 2 mLs (15 mcg total) by nebulization 2 (two) times daily. 12/20/19   Swayze, Ava, DO  benzocaine (HURRICAINE) 20 % GEL Use as directed in the mouth or throat.    [provider]  benzonatate (TESSALON) 200 MG capsule Take 1 capsule (200 mg total) by mouth 3 (three) times daily as needed for cough. 12/20/19   Swayze, Ava, DO  Calcium Carbonate-Vit D-Min (CALTRATE 600+D PLUS MINERALS) 600-800 MG-UNIT TABS Take 1 tablet by mouth daily.    [provider]  fluticasone (FLONASE) 50 MCG/ACT nasal spray Place 1-2 sprays into both nostrils daily. 11/20/19   [provider]  guaiFENesin (MUCINEX) 600 MG 12 hr tablet Take 2 tablets (1,200 mg total) by mouth 2 (two) times daily. 12/20/19   Swayze, Ava, DO  ipratropium-albuterol (DUONEB) 0.5-2.5 (3) MG/3ML SOLN Take 3 mLs by nebulization 3 (three) times daily. 12/20/19   Swayze, Ava, DO  loratadine (CLARITIN) 10 MG tablet Take 10 mg by  mouth daily.    [provider]  oxyCODONE (OXY IR/ROXICODONE) 5 MG immediate release tablet Take 0.5 tablets (2.5 mg total) by mouth every 8 (eight) hours as needed for severe pain. 12/20/19   Swayze, Ava, DO  predniSONE (DELTASONE) 20 MG tablet Take 2 tablets (40 mg total) by mouth daily with breakfast for 3 days, THEN 1.5 tablets (30 mg total) daily with breakfast for 3 days, THEN 1 tablet (20 mg total) daily with breakfast for 3 days, THEN 0.5 tablets (10 mg total) daily with breakfast for 3 days. 12/21/19 01/02/20  Swayze, Ava, DO  sennosides-docusate sodium (SENOKOT-S) 8.6-50 MG tablet Take 1 tablet by mouth in the morning and at bedtime.    [provider]  silver sulfADIAZINE (SILVADENE) 1 % cream Apply 1 application topically daily.    [provider]     Critical care time: 55 minutes       Venetia Night, AGACNP-BC Acute Care Nurse Practitioner [facility]   [phone] / [phone] Please see Amion for pager details.

## 2019-12-22 NOTE — Plan of Care (Signed)

## 2019-12-22 NOTE — Progress Notes (Signed)
Patients daughter Nevin Bloodgood) notified of patient transfer to CCU. Husband called with no answer, a message left to call unit.

## 2019-12-22 NOTE — Procedures (Signed)
Central Venous Catheter Insertion Procedure Note  ANIS CINELLI  [id number]  [birth year]  Date:12/22/19  Time:6:30 AM   Provider Performing:Ainhoa Rallo L [place]   Procedure: Insertion of Non-tunneled Central Venous [phone]) with US guidance ([id number])   Indication(s) Medication administration and Difficult access  Consent Risks of the procedure as well as the alternatives and risks of each were explained to the patient and/or caregiver.  Consent for the procedure was obtained and is signed in the bedside chart. Telephone consent obtained from daughter as husband is extremely hard of hearing.  Anesthesia Topical only with 1% lidocaine   Timeout Verified patient identification, verified procedure, site/side was marked, verified correct patient position, special equipment/implants available, medications/allergies/relevant history reviewed, required imaging and test results available.  Sterile Technique Maximal sterile technique including full sterile barrier drape, hand hygiene, sterile gown, sterile gloves, mask, hair covering, sterile ultrasound probe cover (if used).  Procedure Description Area of catheter insertion was cleaned with chlorhexidine and draped in sterile fashion.  With real-time ultrasound guidance a central venous catheter was placed into the left internal jugular vein. Nonpulsatile blood flow and easy flushing noted in all ports.  The catheter was sutured in place and sterile dressing applied.  Complications/Tolerance None; patient tolerated the procedure well. Chest X-ray is ordered to verify placement for internal jugular or subclavian cannulation.   Chest x-ray is not ordered for femoral cannulation.  EBL Minimal  Specimen(s) None    Venetia Night, AGACNP-BC Acute Care Nurse Practitioner [facility]   [phone] / [phone] Please see Amion for pager details.

## 2019-12-22 NOTE — Progress Notes (Signed)
Pt respiratory status has continued to wax/wane throughout the night and now barely maintaining O2 sats above 88% on 15L NRB and dipping down to the low 80's. Pt tiring out and requiring more accessory muscle use. Paged NP, RT. RT came to bedside, as well as ICU doctor to asses pt.  Decision was made to transfer patient to higher level of care for possible intubation. While transporting patient, I had charge RN Geni Bers to reach out to family.

## 2019-12-22 NOTE — Progress Notes (Addendum)
Pt noted to be in respiratory distress, with decreased O2 sats, increased work of breathing with accessory muscle use and in tripod posture. Pt grunting and gurgly sounding as well, complaining she can't breathe and feels like she is choking. Dayshift RN and I, placed NRB on at 15L. RT and NP on call paged. Will continue to monitor and provide support at patients bedside.

## 2019-12-23 DIAGNOSIS — R1310 Dysphagia, unspecified: Secondary | ICD-10-CM

## 2019-12-23 DIAGNOSIS — K220 Achalasia of cardia: Secondary | ICD-10-CM

## 2019-12-23 LAB — CBC
HCT: 26.5 % — ABNORMAL LOW (ref 36.0–46.0)
Hemoglobin: 8.3 g/dL — ABNORMAL LOW (ref 12.0–15.0)
MCH: 27.7 pg (ref 26.0–34.0)
MCHC: 31.3 g/dL (ref 30.0–36.0)
MCV: 88.3 fL (ref 80.0–100.0)
Platelets: 297 10*3/uL (ref 150–400)
RBC: 3 MIL/uL — ABNORMAL LOW (ref 3.87–5.11)
RDW: 18.1 % — ABNORMAL HIGH (ref 11.5–15.5)
WBC: 15.6 10*3/uL — ABNORMAL HIGH (ref 4.0–10.5)
nRBC: 0.6 % — ABNORMAL HIGH (ref 0.0–0.2)

## 2019-12-23 LAB — GLUCOSE, CAPILLARY
Glucose-Capillary: 184 mg/dL — ABNORMAL HIGH (ref 70–99)
Glucose-Capillary: 193 mg/dL — ABNORMAL HIGH (ref 70–99)
Glucose-Capillary: 198 mg/dL — ABNORMAL HIGH (ref 70–99)
Glucose-Capillary: 224 mg/dL — ABNORMAL HIGH (ref 70–99)
Glucose-Capillary: 215 mg/dL — ABNORMAL HIGH (ref 70–99)

## 2019-12-23 LAB — TRIGLYCERIDES: Triglycerides: 190 mg/dL — ABNORMAL HIGH (ref <150)

## 2019-12-23 LAB — BASIC METABOLIC PANEL
Anion gap: 11 (ref 5–15)
BUN: 16 mg/dL (ref 8–23)
CO2: 28 mmol/L (ref 22–32)
Calcium: 8.6 mg/dL — ABNORMAL LOW (ref 8.9–10.3)
Chloride: 95 mmol/L — ABNORMAL LOW (ref 98–111)
Creatinine, Ser: 0.77 mg/dL (ref 0.44–1.00)
GFR, Estimated: >60 mL/min (ref >60)
Glucose, Bld: 200 mg/dL — ABNORMAL HIGH (ref 70–99)
Potassium: 3.1 mmol/L — ABNORMAL LOW (ref 3.5–5.1)
Sodium: 134 mmol/L — ABNORMAL LOW (ref 135–145)

## 2019-12-23 LAB — MAGNESIUM: Magnesium: 2.1 mg/dL (ref 1.7–2.4)

## 2019-12-23 LAB — BRAIN NATRIURETIC PEPTIDE: B Natriuretic Peptide: 868.2 pg/mL — ABNORMAL HIGH (ref 0.0–100.0)

## 2019-12-23 LAB — HEMOGLOBIN A1C
Hgb A1c MFr Bld: 7.1 % — ABNORMAL HIGH (ref 4.8–5.6)
Mean Plasma Glucose: 157.07 mg/dL

## 2019-12-23 LAB — PHOSPHORUS: Phosphorus: 3 mg/dL (ref 2.5–4.6)

## 2019-12-23 LAB — CREATININE, SERUM
Creatinine, Ser: 0.78 mg/dL (ref 0.44–1.00)
GFR, Estimated: >60 mL/min (ref >60)

## 2019-12-23 MED ORDER — SIMVASTATIN 20 MG PO TABS: 20.0000 mg | ORAL_TABLET | Freq: Every day | ORAL | Status: DC

## 2019-12-23 MED ORDER — METHYLPREDNISOLONE SODIUM SUCC 40 MG IJ SOLR
40.0000 mg | Freq: Every day | INTRAMUSCULAR | Status: DC
  Administered 2019-12-23 – 2019-12-26 (×4): 40 mg via INTRAVENOUS
  Filled 2019-12-23 (×4): qty 1

## 2019-12-23 MED ORDER — APIXABAN 2.5 MG PO TABS: 2.5000 mg | ORAL_TABLET | Freq: Two times a day (BID) | ORAL | Status: DC

## 2019-12-23 MED ORDER — ACETAMINOPHEN 10 MG/ML IV SOLN
1000.0000 mg | Freq: Four times a day (QID) | INTRAVENOUS | Status: AC
  Administered 2019-12-23: 08:00:00 1000 mg via INTRAVENOUS
  Filled 2019-12-23 (×5): qty 100

## 2019-12-23 MED ORDER — POTASSIUM CHLORIDE 20 MEQ PO PACK: 40.0000 meq | PACK | Freq: Once | ORAL | Status: DC

## 2019-12-23 MED ORDER — ENOXAPARIN SODIUM 40 MG/0.4ML ~~LOC~~ SOLN
40.0000 mg | SUBCUTANEOUS | Status: DC
  Administered 2019-12-23 – 2019-12-25 (×3): 40 mg via SUBCUTANEOUS
  Filled 2019-12-23 (×3): qty 0.4

## 2019-12-23 MED ORDER — ESCITALOPRAM OXALATE 10 MG PO TABS
10.0000 mg | ORAL_TABLET | Freq: Every day | ORAL | Status: DC
  Filled 2019-12-23 (×3): qty 1

## 2019-12-23 MED ORDER — CALCIUM CARBONATE-VITAMIN D 500-200 MG-UNIT PO TABS
1.0000 | ORAL_TABLET | Freq: Every day | ORAL | Status: DC
  Filled 2019-12-23: qty 1

## 2019-12-23 MED ORDER — CHLORHEXIDINE GLUCONATE CLOTH 2 % EX PADS
6.0000 | MEDICATED_PAD | Freq: Every day | CUTANEOUS | Status: DC
  Administered 2019-12-23: 23:00:00 6 via TOPICAL

## 2019-12-23 MED ORDER — INSULIN ASPART 100 UNIT/ML ~~LOC~~ SOLN
0.0000 [IU] | SUBCUTANEOUS | Status: DC
  Administered 2019-12-23: 20:00:00 3 [IU] via SUBCUTANEOUS
  Administered 2019-12-24: 09:00:00 1 [IU] via SUBCUTANEOUS
  Administered 2019-12-24: 03:00:00 2 [IU] via SUBCUTANEOUS
  Administered 2019-12-24: 1 [IU] via SUBCUTANEOUS
  Administered 2019-12-24 (×3): 2 [IU] via SUBCUTANEOUS
  Administered 2019-12-24 – 2019-12-25 (×3): 1 [IU] via SUBCUTANEOUS
  Filled 2019-12-23 (×10): qty 1

## 2019-12-23 NOTE — Progress Notes (Signed)
CRITICAL CARE NOTE  CC  follow up respiratory failure  SUBJECTIVE Intubated/sedated Cont to be febrile  Patient remains critically ill Prognosis is guarded     SIGNIFICANT EVENTS  Fevere  BP (!) 136/51   Pulse (!) 104   Temp (!) 102.5 F (39.2 C) (Oral)   Resp (!) 25   Ht '4\' 11"'  (1.499 m)   Wt 59.5 kg   SpO2 95%   BMI 26.49 kg/m    REVIEW OF SYSTEMS  PATIENT IS UNABLE TO PROVIDE COMPLETE REVIEW OF SYSTEMS DUE TO SEVERE CRITICAL ILLNESS   PHYSICAL EXAMINATION:  GENERAL:critically ill appearing, no resp distress HEAD: Normocephalic, atraumatic.  EYES: Pupils equal, round, reactive to light.  No scleral icterus.  MOUTH: Moist mucosal membrane. NECK: Supple. No thyromegaly. No nodules. No JVD.  PULMONARY:bilateral basal rhonci without wheezing.decreased breath sounds at bases CARDIOVASCULAR: S1 and S2. Regular rate and rhythm. No murmurs, rubs, or gallops.  GASTROINTESTINAL: Soft, nontender, -distended. No masses. Positive bowel sounds. No hepatosplenomegaly.  MUSCULOSKELETAL: No swelling, clubbing, or edema.  Left AKA NEUROLOGIC:intubated/sedated SKIN:intact,warm,dry  INTAKE/OUTPUT  Intake/Output Summary (Last 24 hours) at 12/23/2019 0752 Last data filed at 12/23/2019 0700 Gross per 24 hour  Intake 3159.14 ml  Output 875 ml  Net 2284.14 ml    LABS  CBC Recent Labs  Lab 12/20/19 0420 12/22/19 0500 12/23/19 0453  WBC 17.4* 14.9* 15.6*  HGB 9.7* 8.8* 8.3*  HCT 30.2* 29.0* 26.5*  PLT 310 268 297   Coag's No results for input(s): APTT, INR in the last 168 hours. BMET Recent Labs  Lab 12/19/19 0525 12/22/19 0500 12/23/19 0453  NA 137 138 134*  K 4.2 3.6 3.1*  CL 95* 96* 95*  CO2 33* 31 28  BUN 38* 31* 16  CREATININE 0.83 0.79 0.77  0.78  GLUCOSE 156* 165* 200*   Electrolytes Recent Labs  Lab 12/19/19 0525 12/22/19 0500 12/22/19 0633 12/23/19 0453  CALCIUM 9.6 9.2  --  8.6*  MG 2.3  --  2.2 2.1  PHOS 2.6  --  3.5 3.0   Sepsis  Markers Recent Labs  Lab 12/22/19 0633  PROCALCITON 0.35   ABG Recent Labs  Lab 12/22/19 0700  PHART 7.45  PCO2ART 48  PO2ART 65*   Liver Enzymes Recent Labs  Lab 12/18/19 0832 12/19/19 0525  AST 19 18  ALT 18 17  ALKPHOS 72 56  BILITOT 0.3 0.5  ALBUMIN 3.1* 2.9*   Cardiac Enzymes No results for input(s): TROPONINI, PROBNP in the last 168 hours. Glucose Recent Labs  Lab 12/22/19 1120 12/22/19 1605 12/22/19 2006 12/22/19 2330 12/23/19 0341 12/23/19 0723  GLUCAP 160* 175* 173* 170* 184* 193*     Recent Results (from the past 240 hour(s))  Resp Panel by RT-PCR (Flu A&B, Covid) Nasopharyngeal Swab     Status: None   Collection Time: 12/29/2019  3:35 PM   Specimen: Nasopharyngeal Swab; Nasopharyngeal(NP) swabs in vial transport medium  Result Value Ref Range Status   SARS Coronavirus 2 by RT PCR NEGATIVE NEGATIVE Final    Comment: (NOTE) SARS-CoV-2 target nucleic acids are NOT DETECTED.  The SARS-CoV-2 RNA is generally detectable in upper respiratory specimens during the acute phase of infection. The lowest concentration of SARS-CoV-2 viral copies this assay can detect is 138 copies/mL. A negative result does not preclude SARS-Cov-2 infection and should not be used as the sole basis for treatment or other patient management decisions. A negative result may occur with  improper specimen collection/handling, submission of  specimen other than nasopharyngeal swab, presence of viral mutation(s) within the areas targeted by this assay, and inadequate number of viral copies(<138 copies/mL). A negative result must be combined with clinical observations, patient history, and epidemiological information. The expected result is Negative.  Fact Sheet for Patients:  EntrepreneurPulse.com.au  Fact Sheet for Healthcare Providers:  IncredibleEmployment.be  This test is no t yet approved or cleared by the [place] FDA and  has been  authorized for detection and/or diagnosis of SARS-CoV-2 by FDA under an Emergency Use Authorization (EUA). This EUA will remain  in effect (meaning this test can be used) for the duration of the COVID-19 declaration under Section 564(b)(1) of the Act, 21 U.S.C.section 360bbb-3(b)(1), unless the authorization is terminated  or revoked sooner.       Influenza A by PCR NEGATIVE NEGATIVE Final   Influenza B by PCR NEGATIVE NEGATIVE Final    Comment: (NOTE) The Xpert Xpress SARS-CoV-2/FLU/RSV plus assay is intended as an aid in the diagnosis of influenza from Nasopharyngeal swab specimens and should not be used as a sole basis for treatment. Nasal washings and aspirates are unacceptable for Xpert Xpress SARS-CoV-2/FLU/RSV testing.  Fact Sheet for Patients: EntrepreneurPulse.com.au  Fact Sheet for Healthcare Providers: IncredibleEmployment.be  This test is not yet approved or cleared by the [place] FDA and has been authorized for detection and/or diagnosis of SARS-CoV-2 by FDA under an Emergency Use Authorization (EUA). This EUA will remain in effect (meaning this test can be used) for the duration of the COVID-19 declaration under Section 564(b)(1) of the Act, 21 U.S.C. section 360bbb-3(b)(1), unless the authorization is terminated or revoked.  Performed at [facility], [address] [place], [place] [address]   MRSA PCR Screening     Status: Abnormal   Collection Time: 12/15/2019  5:02 PM   Specimen: Nasal Mucosa; Nasopharyngeal  Result Value Ref Range Status   MRSA by PCR POSITIVE (A) NEGATIVE Final    Comment:        The GeneXpert MRSA Assay (FDA approved for NASAL specimens only), is one component of a comprehensive MRSA colonization surveillance program. It is not intended to diagnose MRSA infection nor to guide or monitor treatment for MRSA infections. RESULT CALLED TO, READ BACK BY AND VERIFIED WITH: [email or web address]  STOVER 12/15/19 AT 2000 BY ACR Performed at [facility], [address] [place], [place] [address]   Blood culture (routine x 2)     Status: None   Collection Time: 12/20/2019  5:28 PM   Specimen: BLOOD  Result Value Ref Range Status   Specimen Description BLOOD LEFT FOREARM  Final   Special Requests   Final    BOTTLES DRAWN AEROBIC AND ANAEROBIC Blood Culture adequate volume   Culture   Final    NO GROWTH 5 DAYS Performed at [facility], [address], [place], [place] [address]    Report Status 12/19/2019 FINAL  Final  Blood culture (routine x 2)     Status: None   Collection Time: 12/12/2019  5:28 PM   Specimen: BLOOD  Result Value Ref Range Status   Specimen Description BLOOD RIGHT ASSIST CONTROL  Final   Special Requests   Final    BOTTLES DRAWN AEROBIC AND ANAEROBIC Blood Culture adequate volume   Culture   Final    NO GROWTH 5 DAYS Performed at [facility], [address], [place], [place] [address]    Report Status 12/19/2019 FINAL  Final  SARS Coronavirus 2  by RT PCR (hospital order, performed in [facility] hospital lab) Nasopharyngeal Nasopharyngeal Swab     Status: None   Collection Time: 12/20/19  9:37 AM   Specimen: Nasopharyngeal Swab  Result Value Ref Range Status   SARS Coronavirus 2 NEGATIVE NEGATIVE Final    Comment: (NOTE) SARS-CoV-2 target nucleic acids are NOT DETECTED.  The SARS-CoV-2 RNA is generally detectable in upper and lower respiratory specimens during the acute phase of infection. The lowest concentration of SARS-CoV-2 viral copies this assay can detect is 250 copies / mL. A negative result does not preclude SARS-CoV-2 infection and should not be used as the sole basis for treatment or other patient management decisions.  A negative result may occur with improper specimen collection / handling, submission of specimen other than nasopharyngeal swab, presence of viral mutation(s) within the areas targeted by this  assay, and inadequate number of viral copies (<250 copies / mL). A negative result must be combined with clinical observations, patient history, and epidemiological information.  Fact Sheet for Patients:   StrictlyIdeas.no  Fact Sheet for Healthcare Providers: BankingDealers.co.za  This test is not yet approved or  cleared by the [place] FDA and has been authorized for detection and/or diagnosis of SARS-CoV-2 by FDA under an Emergency Use Authorization (EUA).  This EUA will remain in effect (meaning this test can be used) for the duration of the COVID-19 declaration under Section 564(b)(1) of the Act, 21 U.S.C. section 360bbb-3(b)(1), unless the authorization is terminated or revoked sooner.  Performed at [facility] [address] [place], [place] [address]   Culture, respiratory (non-expectorated)     Status: None (Preliminary result)   Collection Time: 12/22/19  9:49 AM   Specimen: Tracheal Aspirate; Respiratory  Result Value Ref Range Status   Specimen Description   Final    TRACHEAL ASPIRATE Performed at [facility], [address], [place], [place] [address]    Special Requests   Final    NONE Performed at [facility], [address] [place], [place] [address]    Gram Stain   Final    RARE WBC PRESENT, PREDOMINANTLY PMN NO ORGANISMS SEEN Performed at [facility], [place] [address] [place], [place] [address]    Culture PENDING  Incomplete   Report Status PENDING  Incomplete    MEDICATIONS   Current Facility-Administered Medications:  .  0.9 %  sodium chloride infusion, , Intravenous, PRN, Vashti Hey, MD, Last Rate: 10 mL/hr at 12/23/19 0415, Infusion Verify at 12/23/19 0415 .  0.9 %  sodium chloride infusion, 250 mL, Intravenous, Continuous, Rust-Chester, Britton L, NP, Last Rate: 10 mL/hr at 12/23/19 0700, Infusion Verify at 12/23/19 0700 .  acetaminophen  (OFIRMEV) IV 1,000 mg, 1,000 mg, Intravenous, Q6H, Rosine Door, MD .  acetaminophen (TYLENOL) tablet 650 mg, 650 mg, Oral, Q6H PRN, Bonnell Public Tublu, MD, 650 mg at 12/18/19 2230 .  albuterol (VENTOLIN HFA) 108 (90 Base) MCG/ACT inhaler 2 puff, 2 puff, Inhalation, Q6H PRN, Cox, Amy N, DO, 2 puff at 12/20/19 2140 .  apixaban (ELIQUIS) tablet 2.5 mg, 2.5 mg, Oral, BID, Cox, Amy N, DO, 2.5 mg at 12/20/19 2132 .  arformoterol (BROVANA) nebulizer solution 15 mcg, 15 mcg, Nebulization, BID, Raiford Noble [place], [place], 15 mcg at 12/23/19 5427 .  aspirin EC tablet 81 mg, 81 mg, Oral, Daily, Cox, Amy N, DO, 81 mg at 12/20/19 0855 .  benzonatate (TESSALON) capsule 200 mg, 200 mg, Oral, TID PRN, Jamse Arn,  Kyra Searles, MD, 200 mg at 12/20/19 2131 .  budesonide (PULMICORT) nebulizer solution 0.25 mg, 0.25 mg, Nebulization, BID, Raiford Noble Latif, DO, 0.25 mg at 12/23/19 0743 .  calcium-vitamin D (OSCAL WITH D) 500-200 MG-UNIT per tablet 1 tablet, 1 tablet, Oral, Daily, Cox, Amy N, DO, 1 tablet at 12/20/19 0854 .  chlorhexidine gluconate (MEDLINE KIT) (PERIDEX) 0.12 % solution 15 mL, 15 mL, Mouth Rinse, BID, Rust-Chester, Britton L, NP, 15 mL at 12/22/19 2021 .  Chlorhexidine Gluconate Cloth 2 % PADS 6 each, 6 each, Topical, Daily, Rust-Chester, Britton L, NP .  docusate (COLACE) 50 MG/5ML liquid 100 mg, 100 mg, Per Tube, BID, Rust-Chester, Britton L, NP .  escitalopram (LEXAPRO) tablet 10 mg, 10 mg, Oral, Daily, Cox, Amy N, DO, 10 mg at 12/20/19 0855 .  fentaNYL (SUBLIMAZE) injection 25 mcg, 25 mcg, Intravenous, Q15 min PRN, Rust-Chester, Britton L, NP .  fentaNYL (SUBLIMAZE) injection 25-100 mcg, 25-100 mcg, Intravenous, Q30 min PRN, Rust-Chester, Britton L, NP, 100 mcg at 12/22/19 1518 .  furosemide (LASIX) tablet 40 mg, 40 mg, Oral, Daily, Cox, Amy N, DO, 40 mg at 12/20/19 0855 .  guaiFENesin (MUCINEX) 12 hr tablet 1,200 mg, 1,200 mg, Oral, BID, Raiford Noble [place], DO, 1,200 mg at 12/20/19  2132 .  HYDROmorphone (DILAUDID) injection 0.5 mg, 0.5 mg, Intravenous, Q4H PRN, Swayze, Ava, DO, 0.5 mg at 12/22/19 0050 .  ipratropium-albuterol (DUONEB) 0.5-2.5 (3) MG/3ML nebulizer solution 3 mL, 3 mL, Nebulization, TID, Cox, Amy N, DO, 3 mL at 12/23/19 0743 .  lactated ringers infusion, , Intravenous, Continuous, Rosine Door, MD, Last Rate: 75 mL/hr at 12/23/19 0700, Infusion Verify at 12/23/19 0700 .  LORazepam (ATIVAN) tablet 0.5 mg, 0.5 mg, Oral, Q4H PRN, Cox, Amy N, DO, 0.5 mg at 12/20/19 2132 .  MEDLINE mouth rinse, 15 mL, Mouth Rinse, 10 times per day, Rust-Chester, Britton L, NP, 15 mL at 12/23/19 0617 .  meropenem (MERREM) 1 g in sodium chloride 0.9 % 100 mL IVPB, 1 g, Intravenous, Q12H, Rowland Lathe, RPH, Stopped at 12/22/19 2301 .  montelukast (SINGULAIR) tablet 10 mg, 10 mg, Oral, QHS, Cox, Amy N, DO, 10 mg at 12/20/19 2132 .  multivitamin with minerals tablet, , Oral, Daily, Cox, Amy N, DO, 1 tablet at 12/20/19 0855 .  mupirocin ointment (BACTROBAN) 2 % 1 application, 1 application, Nasal, BID, Raiford Noble [place], [place], 1 application at 16/38/45 2300 .  norepinephrine (LEVOPHED) 48m in 2587mpremix infusion, 0-40 mcg/min, Intravenous, Titrated, Rust-Chester, Britton L, NP, Last Rate: 82.5 mL/hr at 12/23/19 0700, 22 mcg/min at 12/23/19 0700 .  oxyCODONE (Oxy IR/ROXICODONE) immediate release tablet 2.5 mg, 2.5 mg, Oral, Q8H PRN, ChBonnell Publicublu, MD, 2.5 mg at 12/20/19 1623 .  pantoprazole (PROTONIX) injection 40 mg, 40 mg, Intravenous, Q12H, Vanga, RoTally DueMD, 40 mg at 12/22/19 2139 .  polyethylene glycol (MIRALAX / GLYCOLAX) packet 17 g, 17 g, Per Tube, Daily, Rust-Chester, Britton L, NP .  pramipexole (MIRAPEX) tablet 0.25 mg, 0.25 mg, Oral, Daily, Cox, Amy N, DO, 0.25 mg at 12/20/19 2133 .  predniSONE (DELTASONE) tablet 40 mg, 40 mg, Oral, Q breakfast, Swayze, Ava, DO, 40 mg at 12/20/19 0856 .  propofol (DIPRIVAN) 1000 MG/100ML infusion, 0-50 mcg/kg/min,  Intravenous, Continuous, Rust-Chester, Britton L, NP, Last Rate: 14.28 mL/hr at 12/23/19 0700, 40 mcg/kg/min at 12/23/19 0700 .  simvastatin (ZOCOR) tablet 20 mg, 20 mg, Oral, QHS, Cox, Amy N, DO, 20 mg at 12/20/19 2131 .  spironolactone (ALDACTONE) tablet  25 mg, 25 mg, Oral, Daily, Cox, Amy N, DO, 25 mg at 12/20/19 0855 .  [START ON 12/24/2019] vancomycin (VANCOCIN) IVPB 1000 mg/200 mL premix, 1,000 mg, Intravenous, Q36H, Damita Dunnings, Asajah R, RPH      Indwelling Urinary Catheter continued, requirement due to   Reason to continue Indwelling Urinary Catheter for strict Intake/Output monitoring for hemodynamic instability   Central Line continued, requirement due to   Reason to continue [employer] Monitoring of central venous pressure or other hemodynamic parameters   Ventilator continued, requirement due to, resp failure    Ventilator Sedation RASS 0 to -2     ASSESSMENT AND PLAN SYNOPSIS   Acute Hypoxic / Hypercapnic Respiratory Failure  Multifactorial in the setting of COPD exacerbation/treated CAP/suspected aspiration pneumonia - Ventilator settings: PRVC  8 mL/kg, 60% FiO2, 5 PEEP, continue ventilator support & lung protective strategies - Wean PEEP & FiO2 as tolerated, maintain SpO2 > 90% - Head of bed elevated 30 degrees, VAP protocol in place - Plateau pressures less than 30 cm H20  - Intermittent chest x-ray & ABG PRN - Daily WUA with SBT as tolerated  - Ensure adequate pulmonary hygiene  - f/u cultures, trend PCT - PAD protocol in place: continue Fentanyl IVP & Propofol drip -Continue Levophed as needed to maintain a MAP > 65 -Continue Unasyn for suspected aspiration pneumonia and sinusitis -Continue Brovana twice daily, albuterol as needed -Continue steroids  Aspiration pneumonia/Fever  Continue vancomycin and meropenem.  Follow sputum and blood cultures WBC count is trending down.  Hypotension  Seems mostly related to propofol.  Continue IV fluids.  Renal  function remains stable   Suspected Achalasia No evidence of obvious obstruction on barium esophogram -Possible upper endoscopy, Eliquis held- 12/10 -Continue Protonix twice daily -GI consulted awaited  HFpEF -- Strict I/O's: alert provider if UOP < 0.5 mL/kg/hr - Daily BMP, replace electrolytes PRN Lasix/aldactone stopped due to hypotension  Hyperlipidemia -Continue simvastatin  Best practice (evaluated daily)  Diet: N.p.o. Pain/Anxiety/Delirium protocol (if indicated): Fentanyl IVP & propofol drip VAP protocol (if indicated): Initiated DVT prophylaxis: SCD's-Eliquis on hold for potential upper endoscopy GI prophylaxis: Protonix twice daily Glucose control: Monitor Q4, target range 140-180 Mobility: Bedrest, mobilize as tolerated Summary of discussion: Patient emergently intubated for airway protection Follow up goals of care discussion due: 12/24/19 Code Status: Full Disposition: ICU  All p.o. medications are on hold as she does not have an NG/OG tube.  GI consult is awaited  P.o. prednisone changed to IV methylprednisone  Critical Care Time devoted to patient care services described in this note is 35  minutes.   Overall, patient is critically ill, prognosis is guarded.  Patient with Multiorgan failure and at high risk for cardiac arrest and death.   Rosine Door, MD  2020/01/15 7:52 AM [facility]

## 2019-12-23 NOTE — Consult Note (Signed)
Pharmacy Antibiotic Note  Natasha [name] is a 86 y.o. female admitted on 12/29/2019 with ?asp. pneumonia/sinusitis.  Pharmacy has been consulted for Vancomycin and Meropenem dosing.  MRSA PCR (+) 12/12- Febrile temp 102.5  Plan: Day 2:  Vancomycin 1500 mg x1 loading dose, followed by 1000 mg Q36H (Expected Cssmin 11.5 mcg/mL). Will check Scr with AM  Labs.   Day 2:  Meropenem 1g Q12H      Height: 4\' 11"  (149.9 cm) Weight: 59.5 kg (131 lb 2.8 oz) IBW/kg (Calculated) : 43.2  Temp (24hrs), Avg:100.3 F (37.9 C), Min:99.3 F (37.4 C), Max:102.5 F (39.2 C)  Recent Labs  Lab 12/17/19 0429 12/18/19 0832 12/19/19 0525 12/20/19 0420 12/22/19 0500 12/23/19 0453  WBC 12.3* 11.5* 9.9 17.4* 14.9* 15.6*  CREATININE 0.74 0.76 0.83  --  0.79 0.77   0.78    Estimated Creatinine Clearance: 37.4 mL/min (by C-G formula based on SCr of 0.78 mg/dL).    Allergies  Allergen Reactions   Codeine Diarrhea    Patient can tolerate small amounts of OXYCODONE per patient info sheet   Hydrocodone Nausea And Vomiting    Patient can tolerate small amounts of OXYCODONE per patient info sheet   Levofloxacin Other (See Comments)    Muscle cramps   Tramadol Nausea And Vomiting   unasyn 12/10 >> 12/11 Vanc 12/11(evening)>> Mero 12/11(evening)>>   Thank you for allowing pharmacy to be a part of this patients care.  Kemauri Musa A 12/23/2019 9:06 AM

## 2019-12-23 NOTE — Progress Notes (Signed)
Natasha [name] , MD [address] [place], [place], [place], [address] [address], [place], [place], [place], [address] Phone: [phone]  Fax: [place] is being followed for dysphagia  Subjective: On the ventilator for respiratory failure, unable to provide history   Objective: Vital signs in last 24 hours: Vitals:   12/23/19 0600 12/23/19 0635 12/23/19 0700 12/23/19 0900  BP: (!) 127/42 125/69 (!) 136/51 (!) 136/54  Pulse: 100 (!) 105 (!) 104 (!) 103  Resp: 10 20 (!) 25 20  Temp:   (!) 102.5 F (39.2 C)   TempSrc:   Oral   SpO2: 94% 93% 95% 94%  Weight:      Height:       Weight change:   Intake/Output Summary (Last 24 hours) at 12/23/2019 0947 Last data filed at 12/23/2019 2637 Gross per 24 hour  Intake 3123.66 ml  Output 1070 ml  Net 2053.66 ml     Exam:  Abdomen: soft, nontender, normal bowel sounds   Lab Results: '@LABTEST2' @ Micro Results: Recent Results (from the past 240 hour(s))  Resp Panel by RT-PCR (Flu A&B, Covid) Nasopharyngeal Swab     Status: None   Collection Time: 12/16/2019  3:35 PM   Specimen: Nasopharyngeal Swab; Nasopharyngeal(NP) swabs in vial transport medium  Result Value Ref Range Status   SARS Coronavirus 2 by RT PCR NEGATIVE NEGATIVE Final    Comment: (NOTE) SARS-CoV-2 target nucleic acids are NOT DETECTED.  The SARS-CoV-2 RNA is generally detectable in upper respiratory specimens during the acute phase of infection. The lowest concentration of SARS-CoV-2 viral copies this assay can detect is 138 copies/mL. A negative result does not preclude SARS-Cov-2 infection and should not be used as the sole basis for treatment or other patient management decisions. A negative result may occur with  improper specimen collection/handling, submission of specimen other than nasopharyngeal swab, presence of viral mutation(s) within the areas targeted by this assay, and inadequate number of viral copies(<138  copies/mL). A negative result must be combined with clinical observations, patient history, and epidemiological information. The expected result is Negative.  Fact Sheet for Patients:  EntrepreneurPulse.com.au  Fact Sheet for Healthcare Providers:  IncredibleEmployment.be  This test is no t yet approved or cleared by the [place] FDA and  has been authorized for detection and/or diagnosis of SARS-CoV-2 by FDA under an Emergency Use Authorization (EUA). This EUA will remain  in effect (meaning this test can be used) for the duration of the COVID-19 declaration under Section 564(b)(1) of the Act, 21 U.S.C.section 360bbb-3(b)(1), unless the authorization is terminated  or revoked sooner.       Influenza A by PCR NEGATIVE NEGATIVE Final   Influenza B by PCR NEGATIVE NEGATIVE Final    Comment: (NOTE) The Xpert Xpress SARS-CoV-2/FLU/RSV plus assay is intended as an aid in the diagnosis of influenza from Nasopharyngeal swab specimens and should not be used as a sole basis for treatment. Nasal washings and aspirates are unacceptable for Xpert Xpress SARS-CoV-2/FLU/RSV testing.  Fact Sheet for Patients: EntrepreneurPulse.com.au  Fact Sheet for Healthcare Providers: IncredibleEmployment.be  This test is not yet approved or cleared by the [place] FDA and has been authorized for detection and/or diagnosis of SARS-CoV-2 by FDA under an Emergency Use Authorization (EUA). This EUA will remain in effect (meaning this test can be used) for the duration of the COVID-19 declaration under Section 564(b)(1) of the Act, 21 U.S.C. section 360bbb-3(b)(1), unless the authorization is terminated or  revoked.  Performed at [facility], [address] [place], [place] [address]   MRSA PCR Screening     Status: Abnormal   Collection Time: 12/21/2019  5:02 PM   Specimen: Nasal Mucosa; Nasopharyngeal  Result  Value Ref Range Status   MRSA by PCR POSITIVE (A) NEGATIVE Final    Comment:        The GeneXpert MRSA Assay (FDA approved for NASAL specimens only), is one component of a comprehensive MRSA colonization surveillance program. It is not intended to diagnose MRSA infection nor to guide or monitor treatment for MRSA infections. RESULT CALLED TO, READ BACK BY AND VERIFIED WITH: WILACYNC STOVER 12/15/19 AT 2000 BY ACR Performed at [facility], [address] [place], [place] [address]   Blood culture (routine x 2)     Status: None   Collection Time: 01/03/2020  5:28 PM   Specimen: BLOOD  Result Value Ref Range Status   Specimen Description BLOOD LEFT FOREARM  Final   Special Requests   Final    BOTTLES DRAWN AEROBIC AND ANAEROBIC Blood Culture adequate volume   Culture   Final    NO GROWTH 5 DAYS Performed at [facility], [address], [place], [place] [address]    Report Status 12/19/2019 FINAL  Final  Blood culture (routine x 2)     Status: None   Collection Time: 12/30/2019  5:28 PM   Specimen: BLOOD  Result Value Ref Range Status   Specimen Description BLOOD RIGHT ASSIST CONTROL  Final   Special Requests   Final    BOTTLES DRAWN AEROBIC AND ANAEROBIC Blood Culture adequate volume   Culture   Final    NO GROWTH 5 DAYS Performed at [facility], [address] [place], [place] [address]    Report Status 12/19/2019 FINAL  Final  SARS Coronavirus 2 by RT PCR (hospital order, performed in [place] hospital lab) Nasopharyngeal Nasopharyngeal Swab     Status: None   Collection Time: 12/20/19  9:37 AM   Specimen: Nasopharyngeal Swab  Result Value Ref Range Status   SARS Coronavirus 2 NEGATIVE NEGATIVE Final    Comment: (NOTE) SARS-CoV-2 target nucleic acids are NOT DETECTED.  The SARS-CoV-2 RNA is generally detectable in upper and lower respiratory specimens during the acute phase of infection. The lowest concentration of SARS-CoV-2 viral  copies this assay can detect is 250 copies / mL. A negative result does not preclude SARS-CoV-2 infection and should not be used as the sole basis for treatment or other patient management decisions.  A negative result may occur with improper specimen collection / handling, submission of specimen other than nasopharyngeal swab, presence of viral mutation(s) within the areas targeted by this assay, and inadequate number of viral copies (<250 copies / mL). A negative result must be combined with clinical observations, patient history, and epidemiological information.  Fact Sheet for Patients:   StrictlyIdeas.no  Fact Sheet for Healthcare Providers: BankingDealers.co.za  This test is not yet approved or  cleared by the [place] FDA and has been authorized for detection and/or diagnosis of SARS-CoV-2 by FDA under an Emergency Use Authorization (EUA).  This EUA will remain in effect (meaning this test can be used) for the duration of the COVID-19 declaration under Section 564(b)(1) of the Act, 21 U.S.C. section 360bbb-3(b)(1), unless the authorization is terminated or revoked sooner.  Performed at [facility], [address] [place], [place] [address]   Culture, respiratory (non-expectorated)  Status: None (Preliminary result)   Collection Time: 12/22/19  9:49 AM   Specimen: Tracheal Aspirate; Respiratory  Result Value Ref Range Status   Specimen Description   Final    TRACHEAL ASPIRATE Performed at [facility], [address] [place], [place] [address]    Special Requests   Final    NONE Performed at [facility], [address] [place], [place] [address]    Gram Stain   Final    RARE WBC PRESENT, PREDOMINANTLY PMN NO ORGANISMS SEEN Performed at [facility], [place] [address] [place], [place] [address]    Culture PENDING  Incomplete   Report Status PENDING  Incomplete    Studies/Results: DG Chest Port 1 View  Result Date: 12/22/2019 CLINICAL DATA:  Status post intubation EXAM: PORTABLE CHEST 1 VIEW COMPARISON:  12/19/2019 chest radiograph and prior FINDINGS: Endotracheal tube is approximately 1.6 cm above the carina. Left IJ approach CVC tip overlies the superior right atrium. No pneumothorax. Trace bilateral pleural effusions and patchy basilar opacities. Stable cardiomediastinal silhouette. Midthoracic hyperdensity likely reflects residual enteric contrast. Multilevel spondylosis. IMPRESSION: ETT tip 1.6 cm above the carina. Bibasilar opacities and trace effusions. Differential includes atelectasis, edema or infection. Stable cardiomegaly. Electronically Signed   By: Primitivo Gauze M.D.   On: 12/22/2019 07:21   DG ESOPHAGUS W SINGLE CM (SOL OR THIN BA)  Result Date: 12/21/2019 CLINICAL DATA:  Dysphagia EXAM: ESOPHOGRAM/BARIUM SWALLOW TECHNIQUE: Single contrast examination was performed using  thin barium. FLUOROSCOPY TIME:  Fluoroscopy Time:  30 seconds Radiation Exposure Index (if provided by the fluoroscopic device): 3.6 mGy COMPARISON:  None. FINDINGS: Technically limited study due to difficulty in patient positioning and inability to follow commands. There is no aspiration. Large hiatal hernia is present with mostly intrathoracic stomach. The esophagus is diffusely dilated. There is holdup of contrast at the gastroesophageal junction. This subsequently cleared, but there is significant spontaneous gastroesophageal reflux. Eccentric narrowing of the stomach is probably related to diaphragmatic hiatus. IMPRESSION: Technically limited study. Large hiatal hernia with mostly intrathoracic stomach. Diffusely dilated esophagus with significant gastroesophageal reflux. Electronically Signed   By: Macy Mis M.D.   On: 12/21/2019 16:46   Medications: I have reviewed the patient's current medications. Scheduled Meds: . apixaban  2.5 mg Oral BID  . arformoterol   15 mcg Nebulization BID  . aspirin EC  81 mg Oral Daily  . budesonide (PULMICORT) nebulizer solution  0.25 mg Nebulization BID  . calcium-vitamin D  1 tablet Oral Daily  . chlorhexidine gluconate (MEDLINE KIT)  15 mL Mouth Rinse BID  . Chlorhexidine Gluconate Cloth  6 each Topical Daily  . docusate  100 mg Per Tube BID  . escitalopram  10 mg Oral Daily  . guaiFENesin  1,200 mg Oral BID  . ipratropium-albuterol  3 mL Nebulization TID  . mouth rinse  15 mL Mouth Rinse 10 times per day  . methylPREDNISolone (SOLU-MEDROL) injection  40 mg Intravenous Daily  . montelukast  10 mg Oral QHS  . multivitamin with minerals   Oral Daily  . mupirocin ointment  1 application Nasal BID  . pantoprazole (PROTONIX) IV  40 mg Intravenous Q12H  . polyethylene glycol  17 g Per Tube Daily  . pramipexole  0.25 mg Oral Daily  . simvastatin  20 mg Oral QHS   Continuous Infusions: . sodium chloride 10 mL/hr at 12/23/19 0415  . sodium chloride 10 mL/hr at 12/23/19 0700  . acetaminophen 1,000 mg (12/23/19 0827)  .  lactated ringers 75 mL/hr at 12/23/19 0700  . meropenem (MERREM) IV Stopped (12/22/19 2301)  . norepinephrine (LEVOPHED) Adult infusion 22 mcg/min (12/23/19 0700)  . propofol (DIPRIVAN) infusion 40 mcg/kg/min (12/23/19 0700)  . [START ON 12/24/2019] vancomycin     PRN Meds:.sodium chloride, acetaminophen, albuterol, benzonatate, fentaNYL (SUBLIMAZE) injection, fentaNYL (SUBLIMAZE) injection, HYDROmorphone (DILAUDID) injection, oxyCODONE   Assessment: Principal Problem:   Acute on chronic respiratory failure with hypoxia (HCC) Active Problems:   CAP (community acquired pneumonia)   Aspiration pneumonia of both lower lobes due to gastric secretions (HCC)   Achalasia   Pleuritic chest pain   Hyponatremia   Chronic pain   (HFpEF) heart failure with preserved ejection fraction (HCC)   COPD with acute exacerbation ([place])   Admitted to the ICU for COPD exacerbation, suspected aspiration  pneumonia on the ventilator. On antibiotics. Been consulted for dysphagia previously. Seen by Dr. Marius Ditch. Barium esophagogram showed large hiatal hernia with mostly intrathoracic stomach and diffusely dilated esophagus with significant gastroesophageal reflux. With contents refluxing from the stomach into the esophagus indicates there is relative patency. Dysphagia could be due to the large hiatal hernia causing compression of the esophageal lumen or may have other conditions such as achalasia. Evaluation of this cannot be done when the patient is critically ill. Would need to be done as an outpatient and would potentially require esophageal manometry at [facility] and if achalasia is confirmed then would require appropriate treatment which could be Botox or dilation at [facility]. If no achalasia noted then would need to consider hernia repair which would be with a thoracic surgeon. From the GI point of view suggest to keep the head end of the bed elevated low fiber diet when she is able to take orally, small meals more often rather than large meals at the same time. Continue PPI  I will sign off.  Please call me if any further GI concerns or questions.  We would like to thank you for the opportunity to participate in the care of Natasha [name].      LOS: 9 days   Natasha Bellows, MD 12/23/2019, 9:47 AM

## 2019-12-24 ENCOUNTER — Inpatient Hospital Stay: Payer: Medicare Other

## 2019-12-24 DIAGNOSIS — I5031 Acute diastolic (congestive) heart failure: Secondary | ICD-10-CM

## 2019-12-24 DIAGNOSIS — J441 Chronic obstructive pulmonary disease with (acute) exacerbation: Principal | ICD-10-CM

## 2019-12-24 LAB — CULTURE, RESPIRATORY W GRAM STAIN

## 2019-12-24 LAB — CBC
HCT: 25.4 % — ABNORMAL LOW (ref 36.0–46.0)
Hemoglobin: 7.8 g/dL — ABNORMAL LOW (ref 12.0–15.0)
MCH: 27.1 pg (ref 26.0–34.0)
MCHC: 30.7 g/dL (ref 30.0–36.0)
MCV: 88.2 fL (ref 80.0–100.0)
Platelets: 250 10*3/uL (ref 150–400)
RBC: 2.88 MIL/uL — ABNORMAL LOW (ref 3.87–5.11)
RDW: 17.6 % — ABNORMAL HIGH (ref 11.5–15.5)
WBC: 10.6 10*3/uL — ABNORMAL HIGH (ref 4.0–10.5)
nRBC: 0 % (ref 0.0–0.2)

## 2019-12-24 LAB — TRIGLYCERIDES: Triglycerides: 150 mg/dL — ABNORMAL HIGH (ref <150)

## 2019-12-24 LAB — BASIC METABOLIC PANEL
Anion gap: 9 (ref 5–15)
BUN: 10 mg/dL (ref 8–23)
CO2: 30 mmol/L (ref 22–32)
Calcium: 8.6 mg/dL — ABNORMAL LOW (ref 8.9–10.3)
Chloride: 100 mmol/L (ref 98–111)
Creatinine, Ser: 0.65 mg/dL (ref 0.44–1.00)
GFR, Estimated: >60 mL/min (ref >60)
Glucose, Bld: 173 mg/dL — ABNORMAL HIGH (ref 70–99)
Potassium: 2.8 mmol/L — ABNORMAL LOW (ref 3.5–5.1)
Sodium: 139 mmol/L (ref 135–145)

## 2019-12-24 LAB — GLUCOSE, CAPILLARY
Glucose-Capillary: 122 mg/dL — ABNORMAL HIGH (ref 70–99)
Glucose-Capillary: 133 mg/dL — ABNORMAL HIGH (ref 70–99)
Glucose-Capillary: 135 mg/dL — ABNORMAL HIGH (ref 70–99)
Glucose-Capillary: 136 mg/dL — ABNORMAL HIGH (ref 70–99)
Glucose-Capillary: 144 mg/dL — ABNORMAL HIGH (ref 70–99)
Glucose-Capillary: 159 mg/dL — ABNORMAL HIGH (ref 70–99)
Glucose-Capillary: 165 mg/dL — ABNORMAL HIGH (ref 70–99)
Glucose-Capillary: 166 mg/dL — ABNORMAL HIGH (ref 70–99)

## 2019-12-24 LAB — POTASSIUM: Potassium: 4.5 mmol/L (ref 3.5–5.1)

## 2019-12-24 LAB — PROCALCITONIN: Procalcitonin: 0.42 ng/mL

## 2019-12-24 LAB — PHOSPHORUS: Phosphorus: 3.1 mg/dL (ref 2.5–4.6)

## 2019-12-24 LAB — MAGNESIUM: Magnesium: 2.1 mg/dL (ref 1.7–2.4)

## 2019-12-24 MED ORDER — POTASSIUM CHLORIDE 10 MEQ/50ML IV SOLN
10.0000 meq | INTRAVENOUS | Status: AC
Start: 2019-12-24 — End: 2019-12-24
  Administered 2019-12-24 (×8): 10 meq via INTRAVENOUS
  Filled 2019-12-24 (×8): qty 50

## 2019-12-24 MED ORDER — VITAL HIGH PROTEIN PO LIQD: 1000.0000 mL | ORAL | Status: DC

## 2019-12-24 MED ORDER — PROSOURCE TF PO LIQD
45.0000 mL | Freq: Two times a day (BID) | ORAL | Status: DC
  Filled 2019-12-24 (×6): qty 45

## 2019-12-24 MED ORDER — ADULT MULTIVITAMIN W/MINERALS CH: 1.0000 | ORAL_TABLET | Freq: Every day | ORAL | Status: DC

## 2019-12-24 MED ORDER — VITAL AF 1.2 CAL PO LIQD: 1000.0000 mL | ORAL | Status: DC

## 2019-12-24 NOTE — Progress Notes (Signed)
PT Cancellation Note  Patient Details Name: TULSI CROSSETT MRN: [id number] DOB: 06/02/1928   Cancelled Treatment:    Reason Eval/Treat Not Completed: Medical issues which prohibited therapy (Per chart review, patient noted with transfer to CCU due to decline medical status.  Per guidelines, will require new order to resume PT services. Please re-consult as medically appropriate.)   [place] Salamone H. Owens Shark, PT, DPT, NCS 12/24/19, 8:35 AM [phone]

## 2019-12-24 NOTE — Progress Notes (Signed)
GOALS OF CARE DISCUSSION  The Clinical status was relayed to family in detail- Husband at bedside  Updated and notified of patients medical condition.   Patient is having a weak cough and struggling to remove secretions.   patient with increased WOB and using accessory muscles to breathe Explained to family course of therapy and the modalities     Patient with Progressive multiorgan failure with very low chance of meaningful recovery despite all aggressive and optimal medical therapy. Patient is in the Dying  Process associated with Suffering.  Family understands the situation.  They have consented and agreed to DNR/DNI Daughter is flying in from [place] are satisfied with Plan of action and management. All questions answered  Additional CC time 32 mins   Alajiah Dutkiewicz Patricia Pesa, M.D.  [facility]  Medical Director [place] Director [facility] Cardio-Pulmonary Department

## 2019-12-24 NOTE — Progress Notes (Signed)
OT Cancellation Note  Patient Details Name: Natasha [name] MRN: [id number] DOB: May 28, 1928   Cancelled Treatment:    Reason Eval/Treat Not Completed: Medical issues which prohibited therapy  Pt transferred to ICU on 12/11 d/t increased oxygen needs with worsening status. Pt ultimately required intubation and remains intubated at this time. Will complete OT order at this time. Please re-consult if/when pt becomes more appropriate for therapy participation. Thank you.  Gerrianne Scale, [place], OTR/L ascom [phone] 12/24/19, 8:37 AM

## 2019-12-24 NOTE — Consult Note (Signed)
PHARMACY CONSULT NOTE - FOLLOW UP  Pharmacy Consult for Electrolyte Monitoring and Replacement   Recent Labs: Potassium (mmol/L)  Date Value  12/24/2019 2.8 (L)  01/28/2014 3.3 (L)   Magnesium (mg/dL)  Date Value  12/24/2019 2.1  01/28/2014 1.7 (L)   Calcium (mg/dL)  Date Value  12/24/2019 8.6 (L)   Calcium, Total (mg/dL)  Date Value  01/28/2014 9.2   Albumin (g/dL)  Date Value  12/19/2019 2.9 (L)  05/10/2013 2.0 (L)   Phosphorus (mg/dL)  Date Value  12/24/2019 3.1   Sodium (mmol/L)  Date Value  12/24/2019 139  01/28/2014 140   Corrected Calcium:  10.0  Assessment: 86 year female admitted to ICU for COPD exacerbation, suspected aspiration PNA in the setting of suspected achalasia on the ventilator and antibiotics (Vancomycin and Meropenem). NPO since 12/11. No current enteral access, plan for OG tube placement 12/13  Medications 12/13: K+ 2.8. IV KCL 10 mEq x 8 ordered 12/4: Calcium-Vit D 500-200 1 tab daily   Goal of Therapy:  Electrolytes WNL  Plan:   IV KCL 10 mEq x 8 ordered, recheck K+ following administrations @ 1600  Replenishment witth oral KCL preferred when enteral access obtained  Recheck BMP with morning labs  Dorothe Pea ,PharmD, BCPS Clinical Pharmacist 12/24/2019 11:27 AM

## 2019-12-24 NOTE — Progress Notes (Signed)
Patient has been desaturating on the 30%, increased back to 40%.

## 2019-12-24 NOTE — Progress Notes (Signed)
GOALS OF CARE DISCUSSION  The Clinical status was relayed to family in detail-Daughter.  Updated and notified of patients medical condition.    patient with increased WOB and using accessory muscles to breathe Explained to family course of therapy and the modalities     Patient with Progressive multiorgan failure with very low chance of meaningful recovery despite all aggressive and optimal medical therapy. Patient is in the Dying  Process associated with Suffering.  Family understands the situation.   Family are satisfied with Plan of action and management. All questions answered  Additional CC time 32 mins   Natasha [name] Patricia Pesa, M.D.  [facility]  Medical Director [place] Director [facility] Cardio-Pulmonary Department

## 2019-12-24 NOTE — Progress Notes (Signed)
Initial Nutrition Assessment  DOCUMENTATION CODES:   Not applicable  INTERVENTION:  Once enteral access obtained and confirmed: -Initiate Vital AF 1.2 Cal at 20 mL/hr and after 12 hours advance to goal rate of 35 mL/hr (840 mL goal daily volume) per tube -Provide PROSource TF 45 mL BID per tube -Goal regimen provides 1088 kcal, 85 grams of protein, 680 mL H2O daily. With current propofol rate provides 1466 kcal daily.  Continue MVI daily and provide per tube once access obtained.  Monitor magnesium, potassium, and phosphorus daily for at least 3 days, MD to replete as needed, as pt is at risk for refeeding syndrome.  NUTRITION DIAGNOSIS:   Inadequate oral intake related to inability to eat as evidenced by NPO status.  GOAL:   Patient will meet greater than or equal to 90% of their needs  MONITOR:   Vent status,Weight trends,Labs,TF tolerance,I & O's  REASON FOR ASSESSMENT:   Malnutrition Screening Tool,Ventilator,Consult Enteral/tube feeding initiation and management  ASSESSMENT:   86 year old female with PMHx of GERD, asthma, arthritis, HTN, bronchitis admitted with acute on chronic hypoxic respiratory failure and suspected aspiration PNA in the setting of suspected achalasia.   12/3 admitted to hospital 12/11 transferred to ICU and intubated  Patient is currently intubated on ventilator support MV: 8 L/min Temp (24hrs), Avg:98.3 F (36.8 C), Min:98.1 F (36.7 C), Max:98.4 F (36.9 C)  Propofol: 14.3 ml/hr (378 kcal daily)  Medications reviewed and include: Oscal with D 1 tablet daily, Colace 100 mg BID, Novolog 0-9 units Q4hrs, Solu-Medrol 40 mg daily IV, MVI daily, Protonix, Miralax, meropenem, norepinephrine gtt at 3 mcg/min, potassium chloride IV replacement today, propofol gtt, vancomycin.   Labs reviewed: CBG 135-166, Potassium 2.8, Triglycerides 150.  I/O: 2135 mL UOP yesterday (1.5 mL/kg/hr)  Weight trend: 59.5 kg on 12/10 (131.17 lbs) - only wt during  admission  Discussed with RN and on rounds. No current enteral access. Plan for placement of OGT and initiation of tube feeds today. Per review of chart PO intake variable prior to intubation (0-100%). Patient may be at risk for refeeding syndrome.  NUTRITION - FOCUSED PHYSICAL EXAM:  Flowsheet Row Most Recent Value  Orbital Region No depletion  Upper Arm Region Mild depletion  Thoracic and Lumbar Region No depletion  Buccal Region Unable to assess  Temple Region No depletion  Clavicle Bone Region No depletion  Clavicle and Acromion Bone Region No depletion  Scapular Bone Region Unable to assess  Dorsal Hand Mild depletion  Patellar Region Moderate depletion  Anterior Thigh Region Moderate depletion  Posterior Calf Region Severe depletion  Edema (RD Assessment) Mild  Hair Reviewed  Eyes Unable to assess  Mouth Unable to assess  Skin Reviewed  Nails Reviewed     Diet Order:   Diet Order            Diet NPO time specified  Diet effective now           Diet - low sodium heart healthy                EDUCATION NEEDS:   No education needs have been identified at this time  Skin:  Skin Assessment: Reviewed RN Assessment  Last BM:  12/16/2019  per chart  Height:   Ht Readings from Last 1 Encounters:  12/15/19 4\' 11"  (1.499 m)   Weight:   Wt Readings from Last 1 Encounters:  12/21/19 59.5 kg   BMI:  Body mass index is 26.49 kg/m.  Estimated Nutritional Needs:   Kcal:  1488  Protein:  80-90 grams  Fluid:  1.5 L/day  Jacklynn Barnacle, MS, RD, LDN Pager number available on Amion

## 2019-12-24 NOTE — Progress Notes (Signed)
OG not placed per Dr. Mortimer Fries, on hold until he speaks to family and evals esophagus.

## 2019-12-24 NOTE — Progress Notes (Signed)
CRITICAL CARE NOTE   86 year old female with acute on chronic hypoxic respiratory failure and suspected aspiration pneumonia in the setting of suspected achalasia requiring emergent intubation and mechanical ventilation admitted to the ICU.   EVENTS 12/29/2019 admit to hospital 12/11 admit to ICU in respiratory distress, emergently intubated 12/13 severe resp failure, will attempt weaning trails  01/03/2020 CTa >> negative for pulmonary emboli, left base opacity-atelectasis versus pneumonia.  Large hiatal hernia with atelectasis adjacent to the hernia in the right base, CAD, emphysematous changes in the lungs 12/19/19 echocardiogram >> LVEF 60-65%, moderate LVH, all else WNL 12/20/19 CT neck >> new dilated and fluid-filled thoracic esophagus, GERD versus gastric outlet obstruction, mild acute bilateral maxillary sinusitis  Micro Data:  12/19/2019 COVID-19 >> negative 12/16/2019 influenza A/B >> negative 12/13/2019 MRSA PCR >> positive 12/31/2019 blood cultures x2 >> negative 12/20/19 COVID-19 >> negative  Antimicrobials:  12/19/2019 azithromycin >> 12/18/19 12/27/2019 ceftriaxone >> 12/18/19 12/20/19 Unasyn >>   CC  follow up respiratory failure  SUBJECTIVE Patient remains critically ill Prognosis is guarded   BP (!) 148/66   Pulse 64   Temp 98.4 F (36.9 C) (Axillary)   Resp 13   Ht '4\' 11"'  (1.499 m)   Wt 59.5 kg   SpO2 100%   BMI 26.49 kg/m    I/O last 3 completed shifts: In: 5052 [I.V.:4220; IV Piggyback:832] Out: 2760 [Urine:2760] No intake/output data recorded.  SpO2: 100 % O2 Flow Rate (L/min): 15 L/min FiO2 (%): (S) 40 %  Estimated body mass index is 26.49 kg/m as calculated from the following:   Height as of this encounter: '4\' 11"'  (1.499 m).   Weight as of this encounter: 59.5 kg.  SIGNIFICANT EVENTS   REVIEW OF SYSTEMS  PATIENT IS UNABLE TO PROVIDE COMPLETE REVIEW OF SYSTEMS DUE TO SEVERE CRITICAL ILLNESS        PHYSICAL EXAMINATION:  GENERAL:critically ill  appearing, +resp distress HEAD: Normocephalic, atraumatic.  EYES: Pupils equal, round, reactive to light.  No scleral icterus.  MOUTH: Moist mucosal membrane. NECK: Supple.  PULMONARY: +rhonchi, +wheezing CARDIOVASCULAR: S1 and S2. Regular rate and rhythm. No murmurs, rubs, or gallops.  GASTROINTESTINAL: Soft, nontender, -distended.  Positive bowel sounds.   MUSCULOSKELETAL: No swelling, clubbing, or edema.  NEUROLOGIC: obtunded, GCS<8 SKIN:intact,warm,dry  MEDICATIONS: I have reviewed all medications and confirmed regimen as documented   CULTURE RESULTS   Recent Results (from the past 240 hour(s))  Resp Panel by RT-PCR (Flu A&B, Covid) Nasopharyngeal Swab     Status: None   Collection Time: 01/05/2020  3:35 PM   Specimen: Nasopharyngeal Swab; Nasopharyngeal(NP) swabs in vial transport medium  Result Value Ref Range Status   SARS Coronavirus 2 by RT PCR NEGATIVE NEGATIVE Final    Comment: (NOTE) SARS-CoV-2 target nucleic acids are NOT DETECTED.  The SARS-CoV-2 RNA is generally detectable in upper respiratory specimens during the acute phase of infection. The lowest concentration of SARS-CoV-2 viral copies this assay can detect is 138 copies/mL. A negative result does not preclude SARS-Cov-2 infection and should not be used as the sole basis for treatment or other patient management decisions. A negative result may occur with  improper specimen collection/handling, submission of specimen other than nasopharyngeal swab, presence of viral mutation(s) within the areas targeted by this assay, and inadequate number of viral copies(<138 copies/mL). A negative result must be combined with clinical observations, patient history, and epidemiological information. The expected result is Negative.  Fact Sheet for Patients:  EntrepreneurPulse.com.au  Fact Sheet for Healthcare  Providers:  IncredibleEmployment.be  This test is no t yet approved or cleared  by the [place] and  has been authorized for detection and/or diagnosis of SARS-CoV-2 by FDA under an Emergency Use Authorization (EUA). This EUA will remain  in effect (meaning this test can be used) for the duration of the COVID-19 declaration under Section 564(b)(1) of the Act, 21 U.S.C.section 360bbb-3(b)(1), unless the authorization is terminated  or revoked sooner.       Influenza A by PCR NEGATIVE NEGATIVE Final   Influenza B by PCR NEGATIVE NEGATIVE Final    Comment: (NOTE) The Xpert Xpress SARS-CoV-2/FLU/RSV plus assay is intended as an aid in the diagnosis of influenza from Nasopharyngeal swab specimens and should not be used as a sole basis for treatment. Nasal washings and aspirates are unacceptable for Xpert Xpress SARS-CoV-2/FLU/RSV testing.  Fact Sheet for Patients: EntrepreneurPulse.com.au  Fact Sheet for Healthcare Providers: IncredibleEmployment.be  This test is not yet approved or cleared by the [place] FDA and has been authorized for detection and/or diagnosis of SARS-CoV-2 by FDA under an Emergency Use Authorization (EUA). This EUA will remain in effect (meaning this test can be used) for the duration of the COVID-19 declaration under Section 564(b)(1) of the Act, 21 U.S.C. section 360bbb-3(b)(1), unless the authorization is terminated or revoked.  Performed at [facility] [address] [place], [place] [address]   MRSA PCR Screening     Status: Abnormal   Collection Time: 01/10/2020  5:02 PM   Specimen: Nasal Mucosa; Nasopharyngeal  Result Value Ref Range Status   MRSA by PCR POSITIVE (A) NEGATIVE Final    Comment:        The GeneXpert MRSA Assay (FDA approved for NASAL specimens only), is one component of a comprehensive MRSA colonization surveillance program. It is not intended to diagnose MRSA infection nor to guide or monitor treatment for MRSA infections. RESULT CALLED TO, READ  BACK BY AND VERIFIED WITH: WILACYNC STOVER 12/15/19 AT 2000 BY ACR Performed at [facility], [address] [place], [place] [address]   Blood culture (routine x 2)     Status: None   Collection Time: 01/01/2020  5:28 PM   Specimen: BLOOD  Result Value Ref Range Status   Specimen Description BLOOD LEFT FOREARM  Final   Special Requests   Final    BOTTLES DRAWN AEROBIC AND ANAEROBIC Blood Culture adequate volume   Culture   Final    NO GROWTH 5 DAYS Performed at [facility], [address] [place], [place] [address]    Report Status 12/19/2019 FINAL  Final  Blood culture (routine x 2)     Status: None   Collection Time: 01/07/2020  5:28 PM   Specimen: BLOOD  Result Value Ref Range Status   Specimen Description BLOOD RIGHT ASSIST CONTROL  Final   Special Requests   Final    BOTTLES DRAWN AEROBIC AND ANAEROBIC Blood Culture adequate volume   Culture   Final    NO GROWTH 5 DAYS Performed at [facility], [address], [place],  [address]    Report Status 12/19/2019 FINAL  Final  SARS Coronavirus 2 by RT PCR (hospital order, performed in [facility] hospital lab) Nasopharyngeal Nasopharyngeal Swab     Status: None   Collection Time: 12/20/19  9:37 AM   Specimen: Nasopharyngeal Swab  Result Value Ref Range Status   SARS Coronavirus 2 NEGATIVE NEGATIVE Final    Comment: (NOTE) SARS-CoV-2 target  nucleic acids are NOT DETECTED.  The SARS-CoV-2 RNA is generally detectable in upper and lower respiratory specimens during the acute phase of infection. The lowest concentration of SARS-CoV-2 viral copies this assay can detect is 250 copies / mL. A negative result does not preclude SARS-CoV-2 infection and should not be used as the sole basis for treatment or other patient management decisions.  A negative result may occur with improper specimen collection / handling, submission of specimen other than nasopharyngeal swab, presence of viral mutation(s)  within the areas targeted by this assay, and inadequate number of viral copies (<250 copies / mL). A negative result must be combined with clinical observations, patient history, and epidemiological information.  Fact Sheet for Patients:   StrictlyIdeas.no  Fact Sheet for Healthcare Providers: BankingDealers.co.za  This test is not yet approved or  cleared by the [place] FDA and has been authorized for detection and/or diagnosis of SARS-CoV-2 by FDA under an Emergency Use Authorization (EUA).  This EUA will remain in effect (meaning this test can be used) for the duration of the COVID-19 declaration under Section 564(b)(1) of the Act, 21 U.S.C. section 360bbb-3(b)(1), unless the authorization is terminated or revoked sooner.  Performed at [facility], [address] [place], [place] [address]   Culture, respiratory (non-expectorated)     Status: None (Preliminary result)   Collection Time: 12/22/19  9:49 AM   Specimen: Tracheal Aspirate; Respiratory  Result Value Ref Range Status   Specimen Description   Final    TRACHEAL ASPIRATE Performed at [facility], [address] [place], [place] [address]    Special Requests   Final    NONE Performed at [facility], [address] [place], [place] [address]    Gram Stain   Final    RARE WBC PRESENT, PREDOMINANTLY PMN NO ORGANISMS SEEN    Culture   Final    CULTURE REINCUBATED FOR BETTER GROWTH Performed at [facility], [place] [address], [place], [place] [address]    Report Status PENDING  Incomplete  Culture, blood (single) w Reflex to ID Panel     Status: None (Preliminary result)   Collection Time: 12/23/19  8:45 AM   Specimen: BLOOD  Result Value Ref Range Status   Specimen Description BLOOD LEFT HAND  Final   Special Requests   Final    BOTTLES DRAWN AEROBIC AND ANAEROBIC Blood Culture adequate volume   Culture   Final    NO  GROWTH < 24 HOURS Performed at [facility], [address] [place], [place] [address]    Report Status PENDING  Incomplete          IMAGING    DG Chest Port 1 View  Result Date: 12/24/2019 CLINICAL DATA:  Respiratory failure. EXAM: PORTABLE CHEST 1 VIEW COMPARISON:  12/22/2019 FINDINGS: The tip of the endotracheal tube is less than 1 cm from the carina and directed towards the right mainstem bronchus orifice. A left jugular catheter remains in place terminating over the right atrium. The cardiomediastinal silhouette is unchanged with a large hiatal hernia and small amount of associated enteric contrast material again noted. Lung volumes are low with mild bibasilar opacities. Basilar aeration has overall mildly improved. No large pleural effusion or pneumothorax is identified. There is a remote left clavicle fracture. IMPRESSION: 1. Endotracheal tube terminating less than 1 cm from the carina. Consider retracting 2-3 cm. 2. Mildly improved aeration of the lung bases. Electronically Signed   By:  Logan Bores M.D.   On: 12/24/2019 07:35   BMP Latest Ref Rng & Units 12/24/2019 12/23/2019 12/23/2019  Glucose 70 - 99 mg/dL 173(H) 200(H) -  BUN 8 - 23 mg/dL 10 16 -  Creatinine 0.44 - 1.00 mg/dL 0.65 0.77 0.78  BUN/Creat Ratio 11 - 26 - - -  Sodium 135 - 145 mmol/L 139 134(L) -  Potassium 3.5 - 5.1 mmol/L 2.8(L) 3.1(L) -  Chloride 98 - 111 mmol/L 100 95(L) -  CO2 22 - 32 mmol/L 30 28 -  Calcium 8.9 - 10.3 mg/dL 8.6(L) 8.6(L) -        Indwelling Urinary Catheter continued, requirement due to   Reason to continue Indwelling Urinary Catheter strict Intake/Output monitoring for hemodynamic instability         Ventilator continued, requirement due to severe respiratory failure   Ventilator Sedation RASS 0 to -2      ASSESSMENT AND PLAN SYNOPSIS  86 yo WF with end stage COPD with Acute Hypoxic / Hypercapnic Respiratory Failure  Multifactorial in the setting of COPD  exacerbation/treated CAP/suspected aspiration pneumonia  Severe ACUTE Hypoxic and Hypercapnic Respiratory Failure -continue Full MV support -continue Bronchodilator Therapy -Wean Fio2 and PEEP as tolerated -will perform SAT/SBT when respiratory parameters are met -VAP/VENT bundle implementation   NEUROLOGY - intubated and sedated - minimal sedation to achieve a RASS goal: -1 Wake up assessment pending  SEVERE COPD EXACERBATION -continue IV steroids as prescribed -continue NEB THERAPY as prescribed -morphine as needed -wean fio2 as needed and tolerated     CARDIAC ICU monitoring  ID -continue IV abx as prescibed -follow up cultures  GI GI PROPHYLAXIS as indicated  NUTRITIONAL STATUS Nutrition Status:         DIET-->TF's as tolerated Constipation protocol as indicated  ENDO - will use ICU hypoglycemic\Hyperglycemia protocol if indicated     ELECTROLYTES -follow labs as needed -replace as needed -pharmacy consultation and following   DVT/GI PRX ordered and assessed TRANSFUSIONS AS NEEDED MONITOR FSBS I Assessed the need for Labs I Assessed the need for Foley I Assessed the need for Central Venous Line Family Discussion when available I Assessed the need for Mobilization I made an Assessment of medications to be adjusted accordingly Safety Risk assessment completed   CASE DISCUSSED IN MULTIDISCIPLINARY ROUNDS WITH ICU TEAM  Critical Care Time devoted to patient care services described in this note is 45  minutes.   Overall, patient is critically ill, prognosis is guarded.  Patient with Multiorgan failure and at high risk for cardiac arrest and death.   Recommend DNR status, follow up [place], M.D.  [facility]  Medical Director [place] Director [facility] Cardio-Pulmonary Department

## 2019-12-24 NOTE — Progress Notes (Signed)
Patients external cochlear implant found in the bed with patient, device was place in a specimen container with lid closed tightly and placed in cabinet with patient belongings.

## 2019-12-24 NOTE — Consult Note (Signed)
PHARMACY CONSULT NOTE - FOLLOW UP  Pharmacy Consult for Electrolyte Monitoring and Replacement   Recent Labs: Potassium (mmol/L)  Date Value  12/24/2019 4.5  01/28/2014 3.3 (L)   Magnesium (mg/dL)  Date Value  12/24/2019 2.1  01/28/2014 1.7 (L)   Calcium (mg/dL)  Date Value  12/24/2019 8.6 (L)   Calcium, Total (mg/dL)  Date Value  01/28/2014 9.2   Albumin (g/dL)  Date Value  12/19/2019 2.9 (L)  05/10/2013 2.0 (L)   Phosphorus (mg/dL)  Date Value  12/24/2019 3.1   Sodium (mmol/L)  Date Value  12/24/2019 139  01/28/2014 140   Corrected Calcium:  10.0  Assessment: 86 year female admitted to ICU for COPD exacerbation, suspected aspiration PNA in the setting of suspected achalasia on the ventilator and antibiotics (Vancomycin and Meropenem). NPO since 12/11. No current enteral access, plan for OG tube placement 12/13  Medications 12/13: K+ 2.8. IV KCL 10 mEq x 8 ordered 12/4: Calcium-Vit D 500-200 1 tab daily   Goal of Therapy:  Electrolytes WNL  Plan:   12/13 received IV KCL 10 mEq x 8 (last dose at 1251), K+ on recheck @ 1600 was WNL at  4.87mmol/L. Will follow-up with 12/14 AM labs.  Replenishment witth oral KCL preferred when enteral access obtained  Recheck BMP with morning labs  Lorna Dibble ,PharmD, BCPS Clinical Pharmacist 12/24/2019 6:16 PM

## 2019-12-25 DIAGNOSIS — J9600 Acute respiratory failure, unspecified whether with hypoxia or hypercapnia: Secondary | ICD-10-CM

## 2019-12-25 LAB — TRIGLYCERIDES: Triglycerides: 154 mg/dL — ABNORMAL HIGH (ref <150)

## 2019-12-25 LAB — CBC
HCT: 25.9 % — ABNORMAL LOW (ref 36.0–46.0)
Hemoglobin: 7.9 g/dL — ABNORMAL LOW (ref 12.0–15.0)
MCH: 27.2 pg (ref 26.0–34.0)
MCHC: 30.5 g/dL (ref 30.0–36.0)
MCV: 89.3 fL (ref 80.0–100.0)
Platelets: 360 10*3/uL (ref 150–400)
RBC: 2.9 MIL/uL — ABNORMAL LOW (ref 3.87–5.11)
RDW: 18.4 % — ABNORMAL HIGH (ref 11.5–15.5)
WBC: 15.8 10*3/uL — ABNORMAL HIGH (ref 4.0–10.5)
nRBC: 1 % — ABNORMAL HIGH (ref 0.0–0.2)

## 2019-12-25 LAB — GLUCOSE, CAPILLARY
Glucose-Capillary: 150 mg/dL — ABNORMAL HIGH (ref 70–99)
Glucose-Capillary: 100 mg/dL — ABNORMAL HIGH (ref 70–99)
Glucose-Capillary: 115 mg/dL — ABNORMAL HIGH (ref 70–99)
Glucose-Capillary: 147 mg/dL — ABNORMAL HIGH (ref 70–99)
Glucose-Capillary: 79 mg/dL (ref 70–99)
Glucose-Capillary: 89 mg/dL (ref 70–99)

## 2019-12-25 LAB — BASIC METABOLIC PANEL
Anion gap: 9 (ref 5–15)
BUN: 11 mg/dL (ref 8–23)
CO2: 28 mmol/L (ref 22–32)
Calcium: 9.1 mg/dL (ref 8.9–10.3)
Chloride: 105 mmol/L (ref 98–111)
Creatinine, Ser: 0.63 mg/dL (ref 0.44–1.00)
GFR, Estimated: >60 mL/min (ref >60)
Glucose, Bld: 155 mg/dL — ABNORMAL HIGH (ref 70–99)
Potassium: 4.2 mmol/L (ref 3.5–5.1)
Sodium: 142 mmol/L (ref 135–145)

## 2019-12-25 LAB — PHOSPHORUS: Phosphorus: 2.5 mg/dL (ref 2.5–4.6)

## 2019-12-25 LAB — MAGNESIUM: Magnesium: 2.3 mg/dL (ref 1.7–2.4)

## 2019-12-25 LAB — PROCALCITONIN: Procalcitonin: 0.31 ng/mL

## 2019-12-25 MED ORDER — MORPHINE 100MG IN NS 100ML (1MG/ML) PREMIX INFUSION
5.0000 mg/h | INTRAVENOUS | Status: DC
  Administered 2019-12-26: 16:00:00 5 mg/h via INTRAVENOUS
  Filled 2019-12-25: qty 100

## 2019-12-25 MED ORDER — PHENOL 1.4 % MT LIQD
2.0000 | OROMUCOSAL | Status: DC | PRN
  Filled 2019-12-25: qty 177

## 2019-12-25 NOTE — Progress Notes (Signed)
Spouse Evette Doffing  and daughters Nevin Bloodgood and Vaughan Basta present with daughter Arbie Cookey on via video chat visiting pt this shift. Spoke with Dr. Mortimer Fries earlier this shift. Decision was made for one way extubation. Sedation was stopped and pt was extubated at 2:45pm. 02 sats mid to low 80's on 02 via n/c at 3lpm at this time. Pt is responsive but non verbal. Briefly opened her eyes but not making eye contact.

## 2019-12-25 NOTE — Progress Notes (Signed)
Patient successfully extubated to 3L [place] per MD order.

## 2019-12-25 NOTE — Progress Notes (Signed)
CRITICAL CARE NOTE 86 year old female with acute on chronic hypoxic respiratory failure and suspected aspiration pneumonia in the setting of suspectedachalasiarequiring emergent intubation and mechanical ventilation admitted to the ICU.  Barium esophagogram showed large hiatal hernia with mostly intrathoracic stomach and diffusely dilated esophagus with significant gastroesophageal reflux.  EVENTS 12/02/2021admit to hospital 12/11admit to ICU in respiratory distress,emergently intubated 12/13 severe resp failure, will attempt weaning trails 12/13 husband at bedside-patient is now DNR  01/11/2020 CTa >>negative for pulmonary emboli,left base opacity-atelectasis versus pneumonia.Large hiatal hernia with atelectasis adjacent to the hernia in the right base,CAD,emphysematouschanges in the lungs 12/8/21echocardiogram>>LVEF60-65%,moderate LVH,all elseWNL 12/20/19 CT neck>>new dilated and fluid-filled thoracic esophagus,GERD versus gastric outlet obstruction,mild acute bilateral maxillary sinusitis  Micro Data:  12/3/21COVID-19>>negative 12/3/21influenza A/B>>negative 12/3/21MRSA PCR>>positive 12/3/21blood cultures x2>>negative 12/9/21COVID-19>>negative  Antimicrobials:  12/3/21azithromycin>> 12/18/19 12/3/21ceftriaxone>> 12/18/19 12/9/21Unasyn>>      CC  follow up respiratory failure  SUBJECTIVE Patient remains critically ill Prognosis is guarded Prognosis is very poor   BP (!) 114/52   Pulse 75   Temp 98.5 F (36.9 C) (Axillary)   Resp 15   Ht '4\' 11"'  (1.499 m)   Wt 61 kg   SpO2 98%   BMI 27.16 kg/m    I/O last 3 completed shifts: In: 2224 [I.V.:1489.7; IV Piggyback:734.3] Out: 2845 [Urine:2845] No intake/output data recorded.  SpO2: 98 % O2 Flow Rate (L/min): 15 L/min FiO2 (%): 40 %  Estimated body mass index is 27.16 kg/m as calculated from the following:   Height as of this encounter: '4\' 11"'  (1.499 m).   Weight as of  this encounter: 61 kg.  SIGNIFICANT EVENTS   REVIEW OF SYSTEMS  PATIENT IS UNABLE TO PROVIDE COMPLETE REVIEW OF SYSTEMS DUE TO SEVERE CRITICAL ILLNESS        PHYSICAL EXAMINATION:  GENERAL:critically ill appearing, +resp distress NECK: Supple.  PULMONARY: +rhonchi, +wheezing CARDIOVASCULAR: S1 and S2. Regular rate and rhythm. No murmurs, rubs, or gallops.  GASTROINTESTINAL: Soft, nontender, -distended.  Positive bowel sounds.   MUSCULOSKELETAL: No swelling, clubbing, or edema.  NEUROLOGIC: obtunded, GCS<8 SKIN:intact,warm,dry  MEDICATIONS: I have reviewed all medications and confirmed regimen as documented   CULTURE RESULTS   Recent Results (from the past 240 hour(s))  SARS Coronavirus 2 by RT PCR (hospital order, performed in [facility] hospital lab) Nasopharyngeal Nasopharyngeal Swab     Status: None   Collection Time: 12/20/19  9:37 AM   Specimen: Nasopharyngeal Swab  Result Value Ref Range Status   SARS Coronavirus 2 NEGATIVE NEGATIVE Final    Comment: (NOTE) SARS-CoV-2 target nucleic acids are NOT DETECTED.  The SARS-CoV-2 RNA is generally detectable in upper and lower respiratory specimens during the acute phase of infection. The lowest concentration of SARS-CoV-2 viral copies this assay can detect is 250 copies / mL. A negative result does not preclude SARS-CoV-2 infection and should not be used as the sole basis for treatment or other patient management decisions.  A negative result may occur with improper specimen collection / handling, submission of specimen other than nasopharyngeal swab, presence of viral mutation(s) within the areas targeted by this assay, and inadequate number of viral copies (<250 copies / mL). A negative result must be combined with clinical observations, patient history, and epidemiological information.  Fact Sheet for Patients:   StrictlyIdeas.no  Fact Sheet for Healthcare  Providers: BankingDealers.co.za  This test is not yet approved or  cleared by the [place] FDA and has been authorized for detection and/or diagnosis of SARS-CoV-2 by FDA under  an Emergency Use Authorization (EUA).  This EUA will remain in effect (meaning this test can be used) for the duration of the COVID-19 declaration under Section 564(b)(1) of the Act, 21 U.S.C. section 360bbb-3(b)(1), unless the authorization is terminated or revoked sooner.  Performed at [facility], [address] [place], [place] [address]   Culture, respiratory (non-expectorated)     Status: None   Collection Time: 12/22/19  9:49 AM   Specimen: Tracheal Aspirate; Respiratory  Result Value Ref Range Status   Specimen Description   Final    TRACHEAL ASPIRATE Performed at [facility], [address], [place], [place] [address]    Special Requests   Final    NONE Performed at [facility], [address] [place], [place] [address]    Gram Stain   Final    RARE WBC PRESENT, PREDOMINANTLY PMN NO ORGANISMS SEEN    Culture   Final    RARE CANDIDA ALBICANS No Pseudomonas species isolated NO STAPHYLOCOCCUS AUREUS ISOLATED Performed at [facility], [place] [address] [place], [place] [address]    Report Status 12/24/2019 FINAL  Final  Culture, blood (single) w Reflex to ID Panel     Status: None (Preliminary result)   Collection Time: 12/23/19  8:45 AM   Specimen: BLOOD  Result Value Ref Range Status   Specimen Description BLOOD LEFT HAND  Final   Special Requests   Final    BOTTLES DRAWN AEROBIC AND ANAEROBIC Blood Culture adequate volume   Culture   Final    NO GROWTH 2 DAYS Performed at [facility], [address], [place], [place] [address]    Report Status PENDING  Incomplete          IMAGING    No results found.   Nutrition Status: Nutrition Problem: Inadequate oral intake Etiology: inability to  eat Signs/Symptoms: NPO status Interventions: Tube feeding,Prostat,MVI     Indwelling Urinary Catheter continued, requirement due to   Reason to continue Indwelling Urinary Catheter strict Intake/Output monitoring for hemodynamic instability         Ventilator continued, requirement due to severe respiratory failure   Ventilator Sedation RASS 0 to -2      ASSESSMENT AND PLAN SYNOPSIS  86 yo WF with end stage COPD with Acute Hypoxic / Hypercapnic Respiratory Failurein the setting ofCOPD exacerbation/treatedCAP/ aspiration pneumonia with large hiatal intrathoracic hernia with dilated esophagus  Severe ACUTE Hypoxic and Hypercapnic Respiratory Failure -continue Full MV support -continue Bronchodilator Therapy -Wean Fio2 and PEEP as tolerated -will perform SAT/SBT when respiratory parameters are met -VAP/VENT bundle implementation    NEUROLOGY - intubated and sedated - minimal sedation to achieve a RASS goal: -1 Wake up assessment pending   SEVERE COPD EXACERBATION -continue IV steroids as prescribed -continue NEB THERAPY as prescribed -morphine as needed -wean fio2 as needed and tolerated     CARDIAC ICU monitoring  ID -continue IV abx as prescibed -follow up cultures  GI GI PROPHYLAXIS as indicated  NUTRITIONAL STATUS Nutrition Status: Nutrition Problem: Inadequate oral intake Etiology: inability to eat Signs/Symptoms: NPO status Interventions: Tube feeding,Prostat,MVI   DIET-->on hold Constipation protocol as indicated  ENDO - will use ICU hypoglycemic\Hyperglycemia protocol if indicated     ELECTROLYTES -follow labs as needed -replace as needed -pharmacy consultation and following   DVT/GI PRX ordered and assessed TRANSFUSIONS AS NEEDED MONITOR FSBS I Assessed the need for Labs I Assessed the need for Foley I Assessed the need for Central  Venous Line Family Discussion when available I Assessed the need for Mobilization I made  an Assessment of medications to be adjusted accordingly Safety Risk assessment completed   CASE DISCUSSED IN MULTIDISCIPLINARY ROUNDS WITH ICU TEAM  Critical Care Time devoted to patient care services described in this note is 55  minutes.   Overall, patient is critically ill, prognosis is guarded.  Patient with Multiorgan failure and at high risk for cardiac arrest and death.   Recommend DNR status and palliative care consultation  Corrin Parker, M.D.  [facility]  Medical Director [place] Director [facility] Cardio-Pulmonary Department

## 2019-12-25 NOTE — Plan of Care (Signed)

## 2019-12-25 NOTE — Consult Note (Signed)
Patient ID: Natasha [name], female   DOB: 1928/08/12, 86 y.o.   MRN: [id number]  HPI Natasha [name] is a 86 y.o. female seen in consultation at the request of Dr. Mortimer Fries for paraesophageal hernia.  She has multiple co morbidities including End stage COPD, severe fragility, CHF, malnutrition, Chronic resp failure. CAD. DM Admitted to the hospital due to worsening respiratory failure.  Is also being treated for pneumonia.  Was asked to see her regarding her paraesophageal hernia.  Patient is somnolent and is unable to provide a history.  I have obtained history from the chart and also from her daughter who is at the bedside.  He reports significant decline in her overall medical condition over the last few months. Husband specifically wanted a surgical discussion and a second opinion.  Patient has been deemed DNR she is now beginning to have progressive multiorgan failure with very low chance of ending full recovery.  Also thinking about palliative care and comfort care. Did have a CT scan that have personally reviewed showing a large paraesophageal hernia with at least 2/3 of her stomach within the mediastinum.  There is no evidence of strangulation necrosis.  He is currently unable to talk and is laying still in bed Barium swallow shows large HH but no definitive achalasia CBC shows anemia of 7.9 with increased white count and normal platelets.  BMP only shows an abnormal sugar the rest of the lab values are normal Last albumin was 2.9 that was a week ago likely it is worse today  HPI  Past Medical History:  Diagnosis Date  . Anemia   . Arthritis   . Asthma   . Bilateral pneumonia may 2017  . Bronchitis   . Cardiomegaly   . Chronic respiratory failure with hypoxia ([place])   . Cochlear implant in place    bilateral, [name], 04/26/2007   . Gallstones 2015  . GERD (gastroesophageal reflux disease)   . H/O blood clots 2014  . History of home oxygen therapy    at night  .  Hypertension   . Hypokalemia   . Peripheral vascular disease ([place])    with arterial clots- on xarelto  . Polyneuropathy   . Renal disorder    Chronic Kidney Disease, Stage 5  . Restless leg syndrome     Past Surgical History:  Procedure Laterality Date  . ABDOMINAL HYSTERECTOMY  1960  . APPENDECTOMY  1946  . BREAST BIOPSY Bilateral    negative  . CHOLECYSTECTOMY  04-24-13  . COCHLEAR IMPLANT  2009  . COLONOSCOPY  2015   Dr. Rayann Heman   . EYE SURGERY  2008,2009   cataract  . KYPHOPLASTY N/A 08/19/2015   Procedure: KYPHOPLASTY;  Surgeon: Hessie Knows, MD;  Location: [facility];  Service: Orthopedics;  Laterality: N/A;  . PERIPHERAL VASCULAR CATHETERIZATION N/A 02/27/2015   Procedure: Abdominal Aortogram w/Lower Extremity;  Surgeon: Algernon Huxley, MD;  Location: [place] [facility];  Service: Cardiovascular;  Laterality: N/A;  . PERIPHERAL VASCULAR CATHETERIZATION  02/27/2015   Procedure: Lower Extremity Intervention;  Surgeon: Algernon Huxley, MD;  Location: [place] [facility];  Service: Cardiovascular;;  . PERIPHERAL VASCULAR CATHETERIZATION Left 02/28/2015   Procedure: Lower Extremity Angiography;  Surgeon: Algernon Huxley, MD;  Location: [place] [facility];  Service: Cardiovascular;  Laterality: Left;  . PERIPHERAL VASCULAR CATHETERIZATION  02/28/2015   Procedure: Lower Extremity Intervention;  Surgeon: Algernon Huxley, MD;  Location: [place] [facility];  Service: Cardiovascular;;  .  PERIPHERAL VASCULAR CATHETERIZATION Left 05/22/2015   Procedure: Lower Extremity Angiography;  Surgeon: Algernon Huxley, MD;  Location: [place] [facility];  Service: Cardiovascular;  Laterality: Left;  . PERIPHERAL VASCULAR CATHETERIZATION  05/22/2015   Procedure: Lower Extremity Intervention;  Surgeon: Algernon Huxley, MD;  Location: [place] [facility];  Service: Cardiovascular;;  . PERIPHERAL VASCULAR CATHETERIZATION Left 05/23/2015   Procedure: Lower Extremity Angiography;  Surgeon: Algernon Huxley, MD;  Location: [place]  [facility];  Service: Cardiovascular;  Laterality: Left;  . PERIPHERAL VASCULAR CATHETERIZATION  05/23/2015   Procedure: Lower Extremity Intervention;  Surgeon: Algernon Huxley, MD;  Location: [place] [facility];  Service: Cardiovascular;;  . stent placement  2006-2014   multiple stent placements in legs    Family History  Problem Relation Age of Onset  . Heart disease Mother   . Heart disease Father   . Cancer Sister        lung  . Breast cancer Daughter     Social History Social History   Tobacco Use  . Smoking status: Former Smoker    Packs/day: 1.00    Years: 25.00    Pack years: 25.00    Types: Cigarettes    Quit date: 1982    Years since quitting: 39.9  . Smokeless tobacco: Never Used  Vaping Use  . Vaping Use: Never used  Substance Use Topics  . Alcohol use: No  . Drug use: No    Allergies  Allergen Reactions  . Codeine Diarrhea    Patient can tolerate small amounts of OXYCODONE per patient info sheet  . Hydrocodone Nausea And Vomiting    Patient can tolerate small amounts of OXYCODONE per patient info sheet  . Levofloxacin Other (See Comments)    Muscle cramps  . Tramadol Nausea And Vomiting    Current Facility-Administered Medications  Medication Dose Route Frequency Provider Last Rate Last Admin  . 0.9 %  sodium chloride infusion   Intravenous PRN Vashti Hey, MD 10 mL/hr at 12/23/19 0415 Infusion Verify at 12/23/19 0415  . 0.9 %  sodium chloride infusion  250 mL Intravenous Continuous Rust-Chester, Britton L, NP 10 mL/hr at 12/25/19 0700 Infusion Verify at 12/25/19 0700  . acetaminophen (TYLENOL) tablet 650 mg  650 mg Oral Q6H PRN Bonnell Public Tublu, MD   650 mg at 12/18/19 2230  . albuterol (VENTOLIN HFA) 108 (90 Base) MCG/ACT inhaler 2 puff  2 puff Inhalation Q6H PRN Cox, Amy N, DO   2 puff at 12/20/19 2140  . aspirin EC tablet 81 mg  81 mg Oral Daily Cox, Amy N, DO   81 mg at 12/20/19 0855  . benzonatate (TESSALON) capsule 200 mg  200  mg Oral TID PRN Vashti Hey, MD   200 mg at 12/20/19 2131  . budesonide (PULMICORT) nebulizer solution 0.25 mg  0.25 mg Nebulization BID Raiford Noble Latif, DO   0.25 mg at 12/25/19 2248  . calcium-vitamin D (OSCAL WITH D) 500-200 MG-UNIT per tablet 1 tablet  1 tablet Per Tube Daily Rust-Chester, Britton L, NP      . chlorhexidine gluconate (MEDLINE KIT) (PERIDEX) 0.12 % solution 15 mL  15 mL Mouth Rinse BID Rust-Chester, Britton L, NP   15 mL at 12/25/19 0832  . Chlorhexidine Gluconate Cloth 2 % PADS 6 each  6 each Topical Daily Rust-Chester, Huel Cote, NP   6 each at 12/23/19 2246  . docusate (COLACE) 50 MG/5ML liquid 100 mg  100  mg Per Tube BID Rust-Chester, Britton L, NP      . enoxaparin (LOVENOX) injection 40 mg  40 mg Subcutaneous Q24H Rust-Chester, Britton L, NP   40 mg at 12/24/19 2230  . escitalopram (LEXAPRO) tablet 10 mg  10 mg Per Tube Daily Rust-Chester, Britton L, NP      . feeding supplement (PROSource TF) liquid 45 mL  45 mL Per Tube BID Flora Lipps, MD      . feeding supplement (VITAL AF 1.2 CAL) liquid 1,000 mL  1,000 mL Per Tube Q24H Flora Lipps, MD      . fentaNYL (SUBLIMAZE) injection 25 mcg  25 mcg Intravenous Q15 min PRN Rust-Chester, Huel Cote, NP   25 mcg at 12/25/19 0609  . fentaNYL (SUBLIMAZE) injection 25-100 mcg  25-100 mcg Intravenous Q30 min PRN Rust-Chester, Britton L, NP   25 mcg at 12/25/19 1019  . guaiFENesin (MUCINEX) 12 hr tablet 1,200 mg  1,200 mg Oral BID Raiford Noble Burke, DO   1,200 mg at 12/20/19 2132  . insulin aspart (novoLOG) injection 0-9 Units  0-9 Units Subcutaneous Q4H Rust-Chester, Britton L, NP   1 Units at 12/25/19 0355  . ipratropium-albuterol (DUONEB) 0.5-2.5 (3) MG/3ML nebulizer solution 3 mL  3 mL Nebulization TID Cox, Amy N, DO   3 mL at 12/25/19 1425  . MEDLINE mouth rinse  15 mL Mouth Rinse 10 times per day Rust-Chester, Britton L, NP   15 mL at 12/25/19 1600  . methylPREDNISolone sodium succinate (SOLU-MEDROL) 40 mg/mL  injection 40 mg  40 mg Intravenous Daily Rosine Door, MD   40 mg at 12/25/19 1031  . montelukast (SINGULAIR) tablet 10 mg  10 mg Oral QHS Cox, Amy N, DO   10 mg at 12/20/19 2132  . morphine 1734m in NS 1053m(34m74mL) infusion - premix  5-20 mg/hr Intravenous Continuous KasFlora LippsD   Held at 12/25/19 1715  . multivitamin with minerals tablet 1 tablet  1 tablet Per Tube Daily KasFlora LippsD      . norepinephrine (LEVOPHED) 4mg14m 250mL54mmix infusion  0-40 mcg/min Intravenous Titrated Rust-Chester, Britton L, NP 15 mL/hr at 12/25/19 0700 4 mcg/min at 12/25/19 0700  . pantoprazole (PROTONIX) injection 40 mg  40 mg Intravenous Q12H VangaLin Landsman  40 mg at 12/25/19 1100  . polyethylene glycol (MIRALAX / GLYCOLAX) packet 17 g  17 g Per Tube Daily Rust-Chester, Britton L, NP      . pramipexole (MIRAPEX) tablet 0.25 mg  0.25 mg Oral Daily Cox, Amy N, DO   0.25 mg at 12/20/19 2133  . propofol (DIPRIVAN) 1000 MG/100ML infusion  0-50 mcg/kg/min Intravenous Continuous Rust-Chester, Britton L, NP 14.28 mL/hr at 12/25/19 1039 40 mcg/kg/min at 12/25/19 1039  . simvastatin (ZOCOR) tablet 20 mg  20 mg Per Tube QHS Rust-Chester, BrittHuel Cote        Review of Systems Full ROS  was unable to obtain due to mentation  Physical Exam Blood pressure 140/62, pulse 98, temperature 98.2 F (36.8 C), temperature source Axillary, resp. rate 20, height '4\' 11"'  (1.499 m), weight 61 kg, SpO2 (!) 86 %. CONSTITUTIONAL: She is very fragile and debilitated.  She is somnolent. Unable to follow commands EYES: Pupils are equal, round, a, Sclera are non-icteric. EARS, NOSE, MOUTH AND THROAT: SHe is wearing a mask.  LYMPH NODES:  Lymph nodes in the neck are normal. RESPIRATORY:  Lungs are decreased bilaterally with use of accessory muscles.  There  are some coarse breath sounds CARDIOVASCULAR: Heart is regular without murmurs, gallops, or rubs. GI: The abdomen is soft, nontender, and nondistended. There are no  palpable masses. There is no hepatosplenomegaly. There are normal bowel sounds in all quadrants. GU: Rectal deferred.   MUSCULOSKELETAL: . No cyanosis or edema.   SKIN: Turgor is good and there are no pathologic skin lesions or ulcers. NEUROLOGIC: She is somnolent she is nonfocal.  Unable to follow commands due to decrease in mentation PSYCH: Patient is somnolent I am unable to assess system  Data Reviewed  I have personally reviewed the patient's imaging, laboratory findings and medical records.    Assessment/Plan 86 year old female debilitated, malnourished and very fragile with respiratory failure currently in active dying process.  No need for any surgical intervention.  Patient will not survive any intervention at all and this will only prolong her suffering.  I had an extensive discussion with the daughter as well as with the husband and I do think that this was helpful.  And this consultation aided in providing closure to the family.  We will be available for any further questions.  Have discussed this situation in detail with Dr. Micheal Likens, MD FACS General Surgeon 12/25/2019, 5:20 PM

## 2019-12-26 LAB — CBC
HCT: 24.2 % — ABNORMAL LOW (ref 36.0–46.0)
Hemoglobin: 7.5 g/dL — ABNORMAL LOW (ref 12.0–15.0)
MCH: 27.6 pg (ref 26.0–34.0)
MCHC: 31 g/dL (ref 30.0–36.0)
MCV: 89 fL (ref 80.0–100.0)
Platelets: 227 10*3/uL (ref 150–400)
RBC: 2.72 MIL/uL — ABNORMAL LOW (ref 3.87–5.11)
RDW: 18.5 % — ABNORMAL HIGH (ref 11.5–15.5)
WBC: 8.7 10*3/uL (ref 4.0–10.5)
nRBC: 1.5 % — ABNORMAL HIGH (ref 0.0–0.2)

## 2019-12-26 LAB — MAGNESIUM: Magnesium: 2.2 mg/dL (ref 1.7–2.4)

## 2019-12-26 LAB — GLUCOSE, CAPILLARY
Glucose-Capillary: 76 mg/dL (ref 70–99)
Glucose-Capillary: 212 mg/dL — ABNORMAL HIGH (ref 70–99)
Glucose-Capillary: 67 mg/dL — ABNORMAL LOW (ref 70–99)
Glucose-Capillary: 70 mg/dL (ref 70–99)

## 2019-12-26 LAB — BASIC METABOLIC PANEL
Anion gap: 9 (ref 5–15)
BUN: 19 mg/dL (ref 8–23)
CO2: 29 mmol/L (ref 22–32)
Calcium: 8.9 mg/dL (ref 8.9–10.3)
Chloride: 108 mmol/L (ref 98–111)
Creatinine, Ser: 0.76 mg/dL (ref 0.44–1.00)
GFR, Estimated: >60 mL/min (ref >60)
Glucose, Bld: 81 mg/dL (ref 70–99)
Potassium: 3.9 mmol/L (ref 3.5–5.1)
Sodium: 146 mmol/L — ABNORMAL HIGH (ref 135–145)

## 2019-12-26 LAB — TRIGLYCERIDES: Triglycerides: 192 mg/dL — ABNORMAL HIGH (ref <150)

## 2019-12-26 LAB — PHOSPHORUS: Phosphorus: 2.7 mg/dL (ref 2.5–4.6)

## 2019-12-26 MED ORDER — ORAL CARE MOUTH RINSE
15.0000 mL | Freq: Two times a day (BID) | OROMUCOSAL | Status: DC
  Administered 2019-12-26: 16:00:00 15 mL via OROMUCOSAL

## 2019-12-26 MED ORDER — DEXTROSE 50 % IV SOLN
1.0000 | Freq: Once | INTRAVENOUS | Status: AC
  Administered 2019-12-26: 08:00:00 50 mL via INTRAVENOUS
  Filled 2019-12-26: qty 50

## 2019-12-26 MED ORDER — ORAL CARE MOUTH RINSE
15.0000 mL | OROMUCOSAL | Status: DC
  Administered 2019-12-26 (×3): 15 mL via OROMUCOSAL

## 2019-12-26 MED ORDER — CHLORHEXIDINE GLUCONATE 0.12 % MT SOLN
15.0000 mL | Freq: Two times a day (BID) | OROMUCOSAL | Status: DC
  Administered 2019-12-26: 08:00:00 15 mL via OROMUCOSAL

## 2019-12-26 MED ORDER — CHLORHEXIDINE GLUCONATE 0.12% ORAL RINSE (MEDLINE KIT): 15.0000 mL | Freq: Two times a day (BID) | OROMUCOSAL | Status: DC

## 2020-01-02 LAB — CULTURE, BLOOD (SINGLE)
Culture: NO GROWTH
Special Requests: ADEQUATE

## 2020-01-12 NOTE — Progress Notes (Addendum)
Daily Progress Note   Patient Name: Natasha [name]       Date: 2020/01/10 DOB: 1928-02-21  Age: 87 y.o. MRN#: [id number] Attending Physician: Wyvonnia Dusky, MD Primary Care Physician: Venia Carbon, MD Admit Date: 12/31/2019  Reason for Consultation/Follow-up: Establishing goals of care  Subjective: Patient is sitting in bed on 5lpm O2. She only says multiple times "help me." She does not say anything more or answer questions. No indication of pain or SOB  noted. Her 2 daughters and husband are at bedside. Husband is hard of hearing.    We discussed her diagnosis, prognosis, GOC, EOL wishes disposition and options.  A detailed discussion was had today regarding advanced directives.  Concepts specific to code status, artifical feeding and hydration, IV antibiotics and rehospitalization were discussed.  The difference between an aggressive medical intervention path and a comfort care path was discussed.  Values and goals of care important to patient and family were attempted to be elicited.  Discussed limitations of medical interventions to prolong quality of life in some situations and discussed the concept of human mortality.  Husband is able to verbalize with accuracy his wife's medical condition and recommendations. They state they understand she cannot have surgery and cannot improve, and would not want a feeding tube. They would like to shift her to full comfort care and focus only on her comfort and dignity until she dies. They want no further life sustaining treatment.  Discussed her frailty; patient is not stable for D/C out of hospital at this time.   Discussed placing implant in her hear to try again to speak with her. They did so and patient continued to say "help me".  Daughter came to the nursing station to state that after the implant was placed that the patient is complaining of leg pain. Orders are in place for comfort medications. Called back to check on the patient after she received pain medication, to see if she felt better and to see if perhaps she could hear me with the phone right at her ear and without my being muffled with a mask. Phone was on speaker-phone and daughters and husband were present. Could not hear patient. Per family she continued to have leg pain, and then fell asleep during my trying to speak with her.  Family has not had sleep and husband is elderly. Request made for a larger room with 2 beds. Charge RN on receiving unit is helping to facilitate this which is greatly appreciated.   I completed a MOST form today with husband. Her 2 children are at bedside, and the signed original was placed in the chart. A photocopy was placed in the chart to be scanned into EMR. The patient outlined their wishes for the following treatment decisions:  Cardiopulmonary Resuscitation: Do Not Attempt Resuscitation (DNR/No CPR)  Medical Interventions: Comfort Measures: Keep clean, warm, and dry. Use medication by any route, positioning, wound care, and other measures to relieve pain and suffering. Use oxygen, suction and manual treatment of airway obstruction as needed for comfort. Do not transfer to the hospital unless comfort needs cannot be met in current location.  Antibiotics: No antibiotics (use other measures to relieve symptoms)  IV Fluids: No IV fluids (provide other measures to ensure comfort)  Feeding Tube: No feeding tube    Length of Stay: 12  Current Medications: Scheduled Meds:   aspirin EC  81 mg Oral Daily   budesonide (PULMICORT) nebulizer solution  0.25 mg Nebulization BID   calcium-vitamin D  1 tablet Per Tube Daily   chlorhexidine  15 mL Mouth Rinse BID   chlorhexidine gluconate (MEDLINE KIT)  15 mL Mouth Rinse BID    Chlorhexidine Gluconate Cloth  6 each Topical Daily   docusate  100 mg Per Tube BID   enoxaparin (LOVENOX) injection  40 mg Subcutaneous Q24H   escitalopram  10 mg Per Tube Daily   guaiFENesin  1,200 mg Oral BID   insulin aspart  0-9 Units Subcutaneous Q4H   ipratropium-albuterol  3 mL Nebulization TID   mouth rinse  15 mL Mouth Rinse q12n4p   mouth rinse  15 mL Mouth Rinse 10 times per day   methylPREDNISolone (SOLU-MEDROL) injection  40 mg Intravenous Daily   montelukast  10 mg Oral QHS   multivitamin with minerals  1 tablet Per Tube Daily   pantoprazole (PROTONIX) IV  40 mg Intravenous Q12H   polyethylene glycol  17 g Per Tube Daily   pramipexole  0.25 mg Oral Daily   simvastatin  20 mg Per Tube QHS    Continuous Infusions:  sodium chloride 10 mL/hr at 12/23/19 0415   sodium chloride 10 mL/hr at 01-24-2020 1250   morphine Stopped (12/25/19 1715)   norepinephrine (LEVOPHED) Adult infusion Stopped (12/25/19 1557)    PRN Meds: sodium chloride, acetaminophen, albuterol, benzonatate, fentaNYL (SUBLIMAZE) injection, phenol  Physical Exam Pulmonary:     Effort: Pulmonary effort is normal.  Neurological:     Mental Status: She is alert.             Vital Signs: BP (!) 102/46    Pulse 84    Temp 99.6 F (37.6 C) (Axillary)    Resp (!) 21    Ht 4' 11" (1.499 m)    Wt 61 kg    SpO2 92%    BMI 27.16 kg/m  SpO2: SpO2: 92 % O2 Device: O2 Device: Nasal Cannula O2 Flow Rate: O2 Flow Rate (L/min): 5 L/min  Intake/output summary:   Intake/Output Summary (Last 24 hours) at Jan 24, 2020 1600 Last data filed at 01-24-20 1250 Gross per 24 hour  Intake 461.27 ml  Output 800 ml  Net -338.73 ml   LBM: Last BM Date: 12/16/19 Baseline Weight: Weight: 59.5 kg Most recent weight: Weight: 61 kg  Palliative Assessment/Data:    Flowsheet Rows   Flowsheet Row Most Recent Value  Intake Tab   Referral Department Hospitalist  Unit at Time of Referral Med/Surg  Unit  Palliative Care Primary Diagnosis Pulmonary  Date Notified 12/19/19  Palliative Care Type New Palliative care  Reason for referral Clarify Goals of Care  Date of Admission 01/11/2020  Date first seen by Palliative Care 12/20/19  # of days Palliative referral response time 1 Day(s)  # of days IP prior to Palliative referral 5  Clinical Assessment   Psychosocial & Spiritual Assessment   Palliative Care Outcomes       Patient Active Problem List   Diagnosis Date Noted   Aspiration pneumonia of both lower lobes due to gastric secretions ([place]) 12/21/2019   Acute on chronic respiratory failure with hypoxia ([place]) 12/21/2019   Achalasia 12/21/2019   Pleuritic chest pain 12/21/2019   Hyponatremia 12/21/2019   Chronic pain 12/21/2019   (HFpEF) heart failure with preserved ejection fraction ([place]) 12/21/2019   COPD with acute exacerbation ([place]) 12/21/2019   Arterial occlusion 01/14/2018   Sacral fracture ([place]) 11/15/2016   HTN (hypertension) 11/15/2016   GERD (gastroesophageal reflux disease) 11/15/2016   RLS (restless legs syndrome) 11/15/2016   Asthma 11/15/2016   Back pain 10/29/2016   CAP (community acquired pneumonia) 03/27/2016   Cellulitis 03/27/2016   Hypokalemia 03/27/2016   Acute respiratory failure ([place]) 03/27/2016   Sepsis ([place]) 08/24/2015   HCAP (healthcare-associated pneumonia) 06/27/2015   Pneumonia 06/07/2015   Hypotension 03/11/2015   Lower extremity atheroembolism ([place]) 02/27/2015   Ischemic leg 02/27/2015   Calculus of gallbladder with other cholecystitis, without mention of obstruction 04/20/2013    Palliative Care Assessment & Plan    Recommendations/Plan:  Shifting to full comfort care. Not stable for transport. Orders in place by primary team.     Code Status:    Code Status Orders  (From admission, onward)         Start     Ordered   12/24/19 1422  Do not attempt resuscitation (DNR)  Continuous       Question  Answer Comment  In the event of cardiac or respiratory ARREST Do not call a code blue   In the event of cardiac or respiratory ARREST Do not perform Intubation, CPR, defibrillation or ACLS   In the event of cardiac or respiratory ARREST Use medication by any route, position, wound care, and other measures to relive pain and suffering. May use oxygen, suction and manual treatment of airway obstruction as needed for comfort.      12/24/19 1422        Code Status History    Date Active Date Inactive Code Status Order ID Comments User Context   12/15/2019 1702 12/24/2019 1422 Full Code [id number]  CoxBriant Cedar, DO ED   01/24/2018 1222 01/26/2018 2219 DNR [id number]  Saundra Shelling, MD ED   01/14/2018 1532 01/15/2018 1400 DNR [id number]  Hillary Bow, MD Inpatient   01/14/2018 1415 01/14/2018 1532 Full Code [id number]  Hillary Bow, MD ED   07/08/2017 0250 07/11/2017 1625 Full Code [id number]  Harrie Foreman, MD Inpatient   11/16/2016 0038 11/17/2016 1426 Full Code [id number]  Lance Coon, MD Inpatient   10/29/2016 1825 10/31/2016 1923 Full Code [id number]  Demetrios Loll, MD ED   03/27/2016 1540 03/31/2016 1937 Full Code [id number]  Idelle Crouch, MD Inpatient   01/30/2016 1806 02/02/2016 1520 Full Code [id number]  Henreitta Leber, MD Inpatient  08/24/2015 2116 08/25/2015 1024 Full Code [id number]  Gladstone Lighter, MD Inpatient   06/27/2015 1642 06/30/2015 1616 Partial Code [id number]  Bettey Costa, MD Inpatient   06/07/2015 2008 06/11/2015 1926 Full Code [id number]  Fritzi Mandes, MD Inpatient   05/22/2015 1352 05/24/2015 1612 Full Code [id number]  Algernon Huxley, MD Inpatient   03/11/2015 1820 03/16/2015 1415 Full Code [id number]  Fritzi Mandes, MD Inpatient   02/27/2015 1553 03/03/2015 1824 Full Code [id number]  Leonides Schanz Inpatient   Advance Care Planning Activity    Advance Directive Documentation   Flowsheet Row Most Recent Value  Type of Advance Directive Healthcare Power of Attorney  Pre-existing out of  facility DNR order (yellow form or pink MOST form) --  "MOST" Form in Place? --       Prognosis:   Hours - Days    Care plan was discussed with RN, charge RN both units. Epic chat sent to primary attending.  Thank you for allowing the Palliative Medicine Team to assist in the care of this patient.   Time In: 3:15 Time Out: 4:05 Total Time 50 min Prolonged Time Billed  yes      Greater than 50%  of this time was spent counseling and coordinating care related to the above assessment and plan.  Asencion Gowda, NP  Please contact Palliative Medicine Team phone at [phone] for questions and concerns.

## 2020-01-12 NOTE — TOC Progression Note (Signed)
Transition of Care [facility]) - Progression Note    Patient Details  Name: Natasha [name] MRN: [id number] Date of Birth: 07/07/1928  Transition of Care [facility]) CM/SW [place], [place] Phone Number: [phone] 01-21-20, 1:02 PM  Clinical Narrative:     Patient extubated on 12/25/2019.  Family at bedside, pending palliative care consult. TOC will continue  to follow.   Expected Discharge Plan: [place] Barriers to Discharge: Continued Medical Work up  Expected Discharge Plan and Services Expected Discharge Plan: [place]   Discharge Planning Services: CM Consult Post Acute Care Choice: Many Living arrangements for the past 2 months: [place] Expected Discharge Date: 12/20/19               DME Arranged: N/A DME Agency: NA       HH Arranged: NA HH Agency: NA         Social Determinants of Health (SDOH) Interventions    Readmission Risk Interventions No flowsheet data found.

## 2020-01-12 NOTE — Progress Notes (Addendum)
PROGRESS NOTE    Natasha [name]  UXL:[id number] DOB: 1928/03/07 DOA: 01/09/2020 PCP: Venia Carbon, MD    Assessment & Plan:   Principal Problem:   Acute on chronic respiratory failure with hypoxia ([place]) Active Problems:   CAP (community acquired pneumonia)   Aspiration pneumonia of both lower lobes due to gastric secretions (HCC)   Achalasia   Pleuritic chest pain   Hyponatremia   Chronic pain   (HFpEF) heart failure with preserved ejection fraction (HCC)   COPD with acute exacerbation (HCC)   Acute on chronic hypoxic respiratory failure: likely secondary to aspiration pneumonia & COPD. Continue on supplemental oxygen and wean as tolerated   Aspiration pneumonia: completed abx course. Continue on IV steroids, bronchodilators. Encourage incentive spirometry.   COPD exacerbation: severe. Continue on IV steroids, bronchodilators. Encourage incentive spirometry. Continue on supplemental oxygen   Dilated thoracic esophagus: etiology unclear, GERD vs gastric outlet obstruction  Normocytic anemia: no need for a transfusion currently. Will continue to monitor  Leukocytosis: resolved  Hypernatremia: free water deficit 0.5L   Failure to thrive: secondary to all above. Palliative care consulted. Will likely proceed w/ comfort care only. Poor prognosis   DVT prophylaxis: lovenox  Code Status: full  Family Communication: discussed pt's care w/ pt's family at bedside and answered their questions. Poor prognosis  Disposition Plan:  Depends on what family & palliative care decide   Status is: Inpatient  Remains inpatient appropriate because:Hemodynamically unstable, Altered mental status, IV treatments appropriate due to intensity of illness or inability to take PO and Inpatient level of care appropriate due to severity of illness   Dispo: The patient is from: Home              Anticipated d/c is to: SNF              Anticipated d/c date is: 3 days              Patient  currently is not medically stable to d/c.      Consultants:   ICU  General surg   Procedures:   Antimicrobials:    Subjective: Pt is obtunded. Pt is not answering questions  Objective: Vitals:   2020/01/15 0600 2020-01-15 0700 2020-01-15 0748 January 15, 2020 0800  BP: 136/61 (!) 129/49  (!) 124/52  Pulse: 88 87 85 86  Resp: 17 (!) 23 (!) 21 (!) 21  Temp:    98.9 F (37.2 C)  TempSrc:    Axillary  SpO2: 93% (!) 88% 94% 94%  Weight:      Height:        Intake/Output Summary (Last 24 hours) at 15-Jan-2020 0854 Last data filed at 2020/01/15 0805 Gross per 24 hour  Intake 413.77 ml  Output 1075 ml  Net -661.23 ml   Filed Weights   12/21/19 0738 12/25/19 0418  Weight: 59.5 kg 61 kg    Examination:  General exam: Appears calm. Frail appearing  Respiratory system: diminished breath sounds b/l  Cardiovascular system: S1 & S2 +. No rubs, gallops or clicks. Gastrointestinal system: Abdomen is nondistended, soft and nontender. Hypoactive bowel sounds heard. Central nervous system: Pt is obtunded  Psychiatry: Judgement and insight appear abnormal.     Data Reviewed: I have personally reviewed following labs and imaging studies  CBC: Recent Labs  Lab 12/20/19 0420 12/22/19 0500 12/23/19 0453 12/24/19 0243 12/25/19 0411 2020/01/15 0500  WBC 17.4* 14.9* 15.6* 10.6* 15.8* 8.7  NEUTROABS 15.5* 12.6*  --   --   --   --  HGB 9.7* 8.8* 8.3* 7.8* 7.9* 7.5*  HCT 30.2* 29.0* 26.5* 25.4* 25.9* 24.2*  MCV 86.8 89.8 88.3 88.2 89.3 89.0  PLT 310 268 297 250 360 161   Basic Metabolic Panel: Recent Labs  Lab 12/22/19 0500 12/22/19 0633 12/23/19 0453 12/24/19 0243 12/24/19 1608 12/25/19 0411 01-05-2020 0500  NA 138  --  134* 139  --  142 146*  K 3.6  --  3.1* 2.8* 4.5 4.2 3.9  CL 96*  --  95* 100  --  105 108  CO2 31  --  28 30  --  28 29  GLUCOSE 165*  --  200* 173*  --  155* 81  BUN 31*  --  16 10  --  11 19  CREATININE 0.79  --  0.77  0.78 0.65  --  0.63 0.76   CALCIUM 9.2  --  8.6* 8.6*  --  9.1 8.9  MG  --  2.2 2.1 2.1  --  2.3 2.2  PHOS  --  3.5 3.0 3.1  --  2.5 2.7   GFR: Estimated Creatinine Clearance: 37.9 mL/min (by C-G formula based on SCr of 0.76 mg/dL). Liver Function Tests: No results for input(s): AST, ALT, ALKPHOS, BILITOT, PROT, ALBUMIN in the last 168 hours. No results for input(s): LIPASE, AMYLASE in the last 168 hours. No results for input(s): AMMONIA in the last 168 hours. Coagulation Profile: No results for input(s): INR, PROTIME in the last 168 hours. Cardiac Enzymes: No results for input(s): CKTOTAL, CKMB, CKMBINDEX, TROPONINI in the last 168 hours. BNP (last 3 results) No results for input(s): PROBNP in the last 8760 hours. HbA1C: No results for input(s): HGBA1C in the last 72 hours. CBG: Recent Labs  Lab 12/25/19 2102 12/25/19 2325 January 05, 2020 0334 Jan 05, 2020 0731 2020/01/05 0821  GLUCAP 79 89 76 67* 212*   Lipid Profile: Recent Labs    12/25/19 0411 01-05-2020 0500  TRIG 154* 192*   Thyroid Function Tests: No results for input(s): TSH, T4TOTAL, FREET4, T3FREE, THYROIDAB in the last 72 hours. Anemia Panel: No results for input(s): VITAMINB12, FOLATE, FERRITIN, TIBC, IRON, RETICCTPCT in the last 72 hours. Sepsis Labs: Recent Labs  Lab 12/22/19 0960 12/24/19 0243 12/25/19 0411  PROCALCITON 0.35 0.42 0.31    Recent Results (from the past 240 hour(s))  SARS Coronavirus 2 by RT PCR (hospital order, performed in [facility] hospital lab) Nasopharyngeal Nasopharyngeal Swab     Status: None   Collection Time: 12/20/19  9:37 AM   Specimen: Nasopharyngeal Swab  Result Value Ref Range Status   SARS Coronavirus 2 NEGATIVE NEGATIVE Final    Comment: (NOTE) SARS-CoV-2 target nucleic acids are NOT DETECTED.  The SARS-CoV-2 RNA is generally detectable in upper and lower respiratory specimens during the acute phase of infection. The lowest concentration of SARS-CoV-2 viral copies this assay can detect is 250 copies  / mL. A negative result does not preclude SARS-CoV-2 infection and should not be used as the sole basis for treatment or other patient management decisions.  A negative result may occur with improper specimen collection / handling, submission of specimen other than nasopharyngeal swab, presence of viral mutation(s) within the areas targeted by this assay, and inadequate number of viral copies (<250 copies / mL). A negative result must be combined with clinical observations, patient history, and epidemiological information.  Fact Sheet for Patients:   StrictlyIdeas.no  Fact Sheet for Healthcare Providers: BankingDealers.co.za  This test is not yet approved or  cleared by the  [place] Architectural [employer] and has been authorized for detection and/or diagnosis of SARS-CoV-2 by FDA under an Print production planner (EUA).  This EUA will remain in effect (meaning this test can be used) for the duration of the COVID-19 declaration under Section 564(b)(1) of the Act, 21 U.S.C. section 360bbb-3(b)(1), unless the authorization is terminated or revoked sooner.  Performed at [facility], [address] [place], [place] [address]   Culture, respiratory (non-expectorated)     Status: None   Collection Time: 12/22/19  9:49 AM   Specimen: Tracheal Aspirate; Respiratory  Result Value Ref Range Status   Specimen Description   Final    TRACHEAL ASPIRATE Performed at [facility], [address], [place], [place] [address]    Special Requests   Final    NONE Performed at [facility], [address] [place], [place] [address]    Gram Stain   Final    RARE WBC PRESENT, PREDOMINANTLY PMN NO ORGANISMS SEEN    Culture   Final    RARE CANDIDA ALBICANS No Pseudomonas species isolated NO STAPHYLOCOCCUS AUREUS ISOLATED Performed at [facility], [place] [address], [place], [place] [address]    Report Status 12/24/2019 FINAL   Final  Culture, blood (single) w Reflex to ID Panel     Status: None (Preliminary result)   Collection Time: 12/23/19  8:45 AM   Specimen: BLOOD  Result Value Ref Range Status   Specimen Description BLOOD LEFT HAND  Final   Special Requests   Final    BOTTLES DRAWN AEROBIC AND ANAEROBIC Blood Culture adequate volume   Culture   Final    NO GROWTH 3 DAYS Performed at [facility], [address], [place], [place] [address]    Report Status PENDING  Incomplete         Radiology Studies: No results found.      Scheduled Meds: . aspirin EC  81 mg Oral Daily  . budesonide (PULMICORT) nebulizer solution  0.25 mg Nebulization BID  . calcium-vitamin D  1 tablet Per Tube Daily  . chlorhexidine  15 mL Mouth Rinse BID  . Chlorhexidine Gluconate Cloth  6 each Topical Daily  . docusate  100 mg Per Tube BID  . enoxaparin (LOVENOX) injection  40 mg Subcutaneous Q24H  . escitalopram  10 mg Per Tube Daily  . feeding supplement (PROSource TF)  45 mL Per Tube BID  . feeding supplement (VITAL AF 1.2 CAL)  1,000 mL Per Tube Q24H  . guaiFENesin  1,200 mg Oral BID  . insulin aspart  0-9 Units Subcutaneous Q4H  . ipratropium-albuterol  3 mL Nebulization TID  . mouth rinse  15 mL Mouth Rinse q12n4p  . methylPREDNISolone (SOLU-MEDROL) injection  40 mg Intravenous Daily  . montelukast  10 mg Oral QHS  . multivitamin with minerals  1 tablet Per Tube Daily  . pantoprazole (PROTONIX) IV  40 mg Intravenous Q12H  . polyethylene glycol  17 g Per Tube Daily  . pramipexole  0.25 mg Oral Daily  . simvastatin  20 mg Per Tube QHS   Continuous Infusions: . sodium chloride 10 mL/hr at 12/23/19 0415  . sodium chloride 10 mL/hr at 20-Jan-2020 0805  . morphine Stopped (12/25/19 1715)  . norepinephrine (LEVOPHED) Adult infusion Stopped (12/25/19 1557)  . propofol (DIPRIVAN) infusion Stopped (12/25/19 1411)     LOS: 12 days    Time spent: 35 mins     Wyvonnia Dusky, MD [facility] Pager [phone]  If 7PM-7AM, please contact night-coverage 2020/01/15, 8:54 AM

## 2020-01-12 NOTE — Progress Notes (Signed)
Morphine gtt administered for comfort. Family at bedside. Pt passed away at [place] with 2 RNs pronouncing. Family will determine pts wishes for funeral home and call back. RN unable to get 3 rings off left hand due to swelling. Will pass this on. Other belongings sent home with daughter Nevin Bloodgood at bedside.

## 2020-01-12 NOTE — Death Summary Note (Signed)
Death Summary  Natasha [name] XBM:[id number] DOB: 25-Dec-1928 DOA: 12/24/2019  PCP: Venia Carbon, MD   Admit date: 12-24-19 Date of Death: 01/06/20  Final Diagnoses:  Principal Problem:   Acute on chronic respiratory failure with hypoxia ([place]) Active Problems:   CAP (community acquired pneumonia)   Aspiration pneumonia of both lower lobes due to gastric secretions (HCC)   Achalasia   Pleuritic chest pain   Hyponatremia   Chronic pain   (HFpEF) heart failure with preserved ejection fraction (HCC)   COPD with acute exacerbation (HCC)     History of present illness was taken from Dr. Tobie Poet: Natasha [name] is a 87 y.o. female with medical history significant for diastolic heart failure, NYHA class III, anemia, history of cellulitis and abscess of the leg in 2015, chronic obstructive asthma, COPD, CAD of the native coronary artery, diverticulosis, GERD, hypertension, hyperlipidemia, osteoarthritis, osteoporosis, peripheral vascular disease with claudication, restrictive airway, status post above-the-knee amputation of the lower extremity, trigeminal neuralgia, type II non-insulin-dependent diabetes mellitus, former tobacco user, history of anal fissurectomy, rec extraction status post cochlear implant on 04/2007, presented to the emergency department for chief concerns of severe shortness of breath with musculoskeletal chest and back pain.  She was evaluated by her pulmonologist, Dr. Mortimer Fries and at his urging presented to the emergency department for concerns of COPD exacerbation.  She reports congestion, cough that is productive, shortness of breath for two weeks. She reports it worsened in the last 2-3 days. She reports she has difficulty clearing her mucus.  She reports that she has been compliant with her inhaler, however the use of her inhaler did not help improved her symptoms thus prompting her to present to her pulmonologist office.  She endorses chest pain with cough  and deep inhalation. She states the chest pain is sharp and lasts for seconds. She endorses dysuria and denies hematuria. She endorses back pain that started this morning and is sharp and persistent. It is worse with coughing. She denies lost of appetite.   Social history: Lives with her husband.  Former tobacco user with 7.75-pack-year history.  Denies EtOH and recreational drug use.  ED Course: Discussed with ED provider, requesting admission for COPD exacerbation versus CAP Pneumonia. Vital signs were afebrile stable satting 100% on 6 L nasal cannula.  Reassuring.  Status post duo nebs x2, orders for azithromycin and ceftriaxone per ED provider.  CTA of the chest for PE was ordered and read as no pulmonary emboli.  Opacity dependently in the left base possible representation of atelectasis versus pneumonia.  Loss of height at T7, approximately 35% new since 2020, age indeterminate.  Large hiatal hernia with atelectasis that adjacent to the hernia in the right base.  Atherosclerotic changes in the thoracic aorta.  CAD.  Emphysema changes in the lungs.  Hospital Course as per Dr. Mortimer Fries: 87 year old female with acute on chronic hypoxic respiratory failure and suspected aspiration pneumonia in the setting of suspectedachalasiarequiring emergent intubation and mechanical ventilation admitted to the ICU.  Barium esophagogram showed large hiatal hernia with mostly intrathoracic stomach and diffusely dilated esophagus with significant gastroesophageal reflux.  EVENTS 12/13/2021admit to hospital 12/11admit to ICU in respiratory distress,emergently intubated 12/13 severe resp failure, will attempt weaning trails 12/13 husband at bedside-patient is now DNR  12/24/19 CTa >>negative for pulmonary emboli,left base opacity-atelectasis versus pneumonia.Large hiatal hernia with atelectasis adjacent to the hernia in the right base,CAD,emphysematouschanges in the  lungs 12/8/21echocardiogram>>[id number]%,moderate LVH,all elseWNL 12/20/19 CT neck>>new dilated and fluid-filled thoracic  esophagus,GERD versus gastric outlet obstruction,mild acute bilateral maxillary sinusitis   Hospital Course from Dr. Jimmye Norman 01-08-20: By the time I saw the pt, the family wanted to just make the pt comfortable. Palliative care was consulted and palliative care transitioned the pt to comfort care only the same day. Pt unfortunately passed away at [place] on 01/08/20. For more information, please see previous progress/consult notes.     Time: pt passed away at [place]:   [facility] 01-08-20, 5:07 PM     ]

## 2020-01-12 NOTE — Progress Notes (Signed)
Nutrition Brief Follow-Up Note  Chart reviewed. Patient now transitioning to comfort care.   No further nutrition interventions warranted at this time. Please consult RD as needed.   Marik Sedore King, MS, RD, LDN Pager number available on Amion 

## 2020-01-12 DEATH — deceased
